# Patient Record
Sex: Female | Born: 1955 | Race: Black or African American | Hispanic: No | Marital: Married | Smoking: Former smoker
Health system: Southern US, Community
[De-identification: ages and names within clinical notes are randomized; demographics above are authoritative.]

## PROBLEM LIST (undated history)

## (undated) DIAGNOSIS — Z9221 Personal history of antineoplastic chemotherapy: Secondary | ICD-10-CM

## (undated) DIAGNOSIS — E785 Hyperlipidemia, unspecified: Secondary | ICD-10-CM

## (undated) DIAGNOSIS — I10 Essential (primary) hypertension: Secondary | ICD-10-CM

## (undated) DIAGNOSIS — J441 Chronic obstructive pulmonary disease with (acute) exacerbation: Secondary | ICD-10-CM

## (undated) DIAGNOSIS — E119 Type 2 diabetes mellitus without complications: Secondary | ICD-10-CM

## (undated) DIAGNOSIS — J45909 Unspecified asthma, uncomplicated: Secondary | ICD-10-CM

## (undated) DIAGNOSIS — C801 Malignant (primary) neoplasm, unspecified: Secondary | ICD-10-CM

---
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
## 2006-07-23 ENCOUNTER — Other Ambulatory Visit: Payer: Self-pay

## 2006-07-23 ENCOUNTER — Inpatient Hospital Stay: Payer: Self-pay | Admitting: Internal Medicine

## 2006-07-24 ENCOUNTER — Other Ambulatory Visit: Payer: Self-pay

## 2007-02-01 ENCOUNTER — Emergency Department: Payer: Self-pay | Admitting: Emergency Medicine

## 2007-02-01 ENCOUNTER — Other Ambulatory Visit: Payer: Self-pay

## 2008-06-16 ENCOUNTER — Inpatient Hospital Stay: Payer: Self-pay | Admitting: Internal Medicine

## 2009-11-16 ENCOUNTER — Emergency Department: Payer: Self-pay | Admitting: Emergency Medicine

## 2010-05-02 ENCOUNTER — Emergency Department: Payer: Self-pay | Admitting: Emergency Medicine

## 2010-05-04 ENCOUNTER — Inpatient Hospital Stay: Payer: Self-pay | Admitting: Internal Medicine

## 2010-06-13 ENCOUNTER — Ambulatory Visit: Payer: Self-pay | Admitting: Family Medicine

## 2010-07-03 ENCOUNTER — Ambulatory Visit: Payer: Self-pay | Admitting: Family Medicine

## 2010-08-01 ENCOUNTER — Ambulatory Visit: Payer: Self-pay | Admitting: Family Medicine

## 2010-08-06 ENCOUNTER — Observation Stay: Payer: Self-pay | Admitting: Specialist

## 2010-09-01 ENCOUNTER — Ambulatory Visit: Payer: Self-pay | Admitting: Family Medicine

## 2010-10-01 ENCOUNTER — Ambulatory Visit: Payer: Self-pay | Admitting: Family Medicine

## 2011-07-27 ENCOUNTER — Observation Stay: Payer: Self-pay | Admitting: Internal Medicine

## 2011-07-27 DIAGNOSIS — I517 Cardiomegaly: Secondary | ICD-10-CM

## 2011-07-27 DIAGNOSIS — R079 Chest pain, unspecified: Secondary | ICD-10-CM

## 2011-07-27 LAB — COMPREHENSIVE METABOLIC PANEL
Albumin: 3.6 g/dL (ref 3.4–5.0)
Anion Gap: 18 — ABNORMAL HIGH (ref 7–16)
BUN: 15 mg/dL (ref 7–18)
Calcium, Total: 9.4 mg/dL (ref 8.5–10.1)
Chloride: 101 mmol/L (ref 98–107)
EGFR (African American): >60
EGFR (Non-African Amer.): >60
Glucose: 342 mg/dL — ABNORMAL HIGH (ref 65–99)
Osmolality: 296 (ref 275–301)
Potassium: 3.2 mmol/L — ABNORMAL LOW (ref 3.5–5.1)
SGPT (ALT): 19 U/L
Sodium: 141 mmol/L (ref 136–145)
Total Protein: 7.9 g/dL (ref 6.4–8.2)

## 2011-07-27 LAB — CBC
HCT: 40.2 % (ref 35.0–47.0)
MCH: 28.1 pg (ref 26.0–34.0)
MCHC: 32.9 g/dL (ref 32.0–36.0)
MCV: 86 fL (ref 80–100)
Platelet: 266 10*3/uL (ref 150–440)
RDW: 15 % — ABNORMAL HIGH (ref 11.5–14.5)
WBC: 8.7 10*3/uL (ref 3.6–11.0)

## 2011-07-27 LAB — URINALYSIS, COMPLETE
Bacteria: NONE SEEN
Bilirubin,UR: NEGATIVE
Glucose,UR: >=500 mg/dL (ref 0–75)
Leukocyte Esterase: NEGATIVE
Protein: NEGATIVE
WBC UR: <1 /HPF (ref 0–5)

## 2011-07-27 LAB — CK TOTAL AND CKMB (NOT AT ARMC)
CK, Total: 127 U/L (ref 21–215)
CK, Total: 115 U/L (ref 21–215)
CK-MB: 2 ng/mL (ref 0.5–3.6)
CK-MB: 2.8 ng/mL (ref 0.5–3.6)

## 2011-07-27 LAB — TROPONIN I
Troponin-I: <0.02 ng/mL
Troponin-I: 0.18 ng/mL — ABNORMAL HIGH

## 2011-07-27 LAB — HEMOGLOBIN A1C: Hemoglobin A1C: 11.2 % — ABNORMAL HIGH (ref 4.2–6.3)

## 2011-08-05 ENCOUNTER — Encounter: Payer: Self-pay | Admitting: Nurse Practitioner

## 2013-05-25 ENCOUNTER — Inpatient Hospital Stay: Payer: Self-pay | Admitting: Internal Medicine

## 2013-05-25 LAB — CBC WITH DIFFERENTIAL/PLATELET
Basophil #: 0.1 10*3/uL (ref 0.0–0.1)
Basophil %: 0.9 %
Eosinophil #: 0.1 10*3/uL (ref 0.0–0.7)
Lymphocyte %: 19.7 %
MCH: 27.4 pg (ref 26.0–34.0)
MCHC: 32.8 g/dL (ref 32.0–36.0)
MCV: 84 fL (ref 80–100)
Monocyte #: 1 x10 3/mm — ABNORMAL HIGH (ref 0.2–0.9)
Monocyte %: 9.6 %
Neutrophil #: 7.4 10*3/uL — ABNORMAL HIGH (ref 1.4–6.5)
WBC: 10.8 10*3/uL (ref 3.6–11.0)

## 2013-05-25 LAB — COMPREHENSIVE METABOLIC PANEL
Albumin: 3.7 g/dL (ref 3.4–5.0)
Alkaline Phosphatase: 53 U/L
BUN: 10 mg/dL (ref 7–18)
Calcium, Total: 9.5 mg/dL (ref 8.5–10.1)
Chloride: 104 mmol/L (ref 98–107)
Co2: 27 mmol/L (ref 21–32)
Creatinine: 0.76 mg/dL (ref 0.60–1.30)
EGFR (Non-African Amer.): >60
Glucose: 252 mg/dL — ABNORMAL HIGH (ref 65–99)
Osmolality: 281 (ref 275–301)
SGOT(AST): 22 U/L (ref 15–37)
SGPT (ALT): 22 U/L (ref 12–78)

## 2013-05-25 LAB — LIPASE, BLOOD: Lipase: 115 U/L (ref 73–393)

## 2013-05-25 LAB — URINALYSIS, COMPLETE
Ketone: NEGATIVE
Leukocyte Esterase: NEGATIVE
Nitrite: NEGATIVE
Ph: 5 (ref 4.5–8.0)
RBC,UR: 1 /HPF (ref 0–5)
Squamous Epithelial: 6

## 2013-05-26 LAB — CBC WITH DIFFERENTIAL/PLATELET
Basophil #: 0.1 10*3/uL (ref 0.0–0.1)
Basophil %: 0.9 %
HCT: 40.7 % (ref 35.0–47.0)
Lymphocyte %: 30.3 %
MCH: 28.5 pg (ref 26.0–34.0)
MCV: 85 fL (ref 80–100)
Monocyte #: 0.6 x10 3/mm (ref 0.2–0.9)
Monocyte %: 8.2 %
Platelet: 234 10*3/uL (ref 150–440)

## 2013-05-26 LAB — BASIC METABOLIC PANEL
Anion Gap: 7 (ref 7–16)
Calcium, Total: 9 mg/dL (ref 8.5–10.1)
Co2: 27 mmol/L (ref 21–32)
Creatinine: 0.64 mg/dL (ref 0.60–1.30)
EGFR (African American): >60
EGFR (Non-African Amer.): >60
Potassium: 2.9 mmol/L — ABNORMAL LOW (ref 3.5–5.1)

## 2013-05-26 LAB — OCCULT BLOOD X 1 CARD TO LAB, STOOL: Occult Blood, Feces: POSITIVE

## 2013-05-26 LAB — MAGNESIUM: Magnesium: 1.4 mg/dL — ABNORMAL LOW

## 2013-05-26 LAB — CLOSTRIDIUM DIFFICILE(ARMC)

## 2013-05-27 LAB — LIPID PANEL
Cholesterol: 211 mg/dL — ABNORMAL HIGH (ref 0–200)
HDL Cholesterol: 38 mg/dL — ABNORMAL LOW (ref 40–60)
Triglycerides: 232 mg/dL — ABNORMAL HIGH (ref 0–200)
VLDL Cholesterol, Calc: 46 mg/dL — ABNORMAL HIGH (ref 5–40)

## 2013-05-27 LAB — BASIC METABOLIC PANEL
Anion Gap: 7 (ref 7–16)
BUN: 3 mg/dL — ABNORMAL LOW (ref 7–18)
Calcium, Total: 8.7 mg/dL (ref 8.5–10.1)
Co2: 27 mmol/L (ref 21–32)
Creatinine: 0.69 mg/dL (ref 0.60–1.30)
EGFR (African American): >60
EGFR (Non-African Amer.): >60
Glucose: 159 mg/dL — ABNORMAL HIGH (ref 65–99)
Osmolality: 285 (ref 275–301)
Sodium: 143 mmol/L (ref 136–145)

## 2013-05-27 LAB — CBC WITH DIFFERENTIAL/PLATELET
Basophil %: 1 %
Eosinophil #: 0.3 10*3/uL (ref 0.0–0.7)
Eosinophil %: 3.8 %
HCT: 39.1 % (ref 35.0–47.0)
HGB: 13.2 g/dL (ref 12.0–16.0)
Lymphocyte #: 2.3 10*3/uL (ref 1.0–3.6)
Lymphocyte %: 32.8 %
MCH: 28.5 pg (ref 26.0–34.0)
MCHC: 33.8 g/dL (ref 32.0–36.0)
Monocyte #: 0.6 x10 3/mm (ref 0.2–0.9)
Neutrophil #: 3.8 10*3/uL (ref 1.4–6.5)
Neutrophil %: 53.9 %
RBC: 4.65 10*6/uL (ref 3.80–5.20)
RDW: 14.1 % (ref 11.5–14.5)

## 2013-05-28 LAB — BASIC METABOLIC PANEL
Anion Gap: 7 (ref 7–16)
BUN: 9 mg/dL (ref 7–18)
Calcium, Total: 9.4 mg/dL (ref 8.5–10.1)
Co2: 27 mmol/L (ref 21–32)
EGFR (African American): >60
Glucose: 120 mg/dL — ABNORMAL HIGH (ref 65–99)
Osmolality: 274 (ref 275–301)
Potassium: 3.4 mmol/L — ABNORMAL LOW (ref 3.5–5.1)
Sodium: 137 mmol/L (ref 136–145)

## 2013-05-28 LAB — CBC WITH DIFFERENTIAL/PLATELET
Basophil #: 0.1 10*3/uL (ref 0.0–0.1)
Basophil %: 0.9 %
Eosinophil %: 3.6 %
HGB: 13.2 g/dL (ref 12.0–16.0)
Lymphocyte #: 2.5 10*3/uL (ref 1.0–3.6)
MCH: 28.2 pg (ref 26.0–34.0)
MCV: 85 fL (ref 80–100)
Monocyte #: 0.6 x10 3/mm (ref 0.2–0.9)
Neutrophil #: 4.1 10*3/uL (ref 1.4–6.5)
RDW: 14.1 % (ref 11.5–14.5)
WBC: 7.6 10*3/uL (ref 3.6–11.0)

## 2014-05-13 ENCOUNTER — Emergency Department: Payer: Self-pay | Admitting: Emergency Medicine

## 2014-05-13 DIAGNOSIS — M19011 Primary osteoarthritis, right shoulder: Secondary | ICD-10-CM | POA: Diagnosis not present

## 2014-05-13 DIAGNOSIS — I10 Essential (primary) hypertension: Secondary | ICD-10-CM | POA: Diagnosis not present

## 2014-05-13 DIAGNOSIS — Z720 Tobacco use: Secondary | ICD-10-CM | POA: Diagnosis not present

## 2014-05-13 DIAGNOSIS — M7591 Shoulder lesion, unspecified, right shoulder: Secondary | ICD-10-CM | POA: Diagnosis not present

## 2014-05-13 DIAGNOSIS — M779 Enthesopathy, unspecified: Secondary | ICD-10-CM | POA: Diagnosis not present

## 2014-05-13 LAB — BASIC METABOLIC PANEL
Anion Gap: 12 (ref 7–16)
BUN: 12 mg/dL (ref 7–18)
CALCIUM: 8.7 mg/dL (ref 8.5–10.1)
CREATININE: 0.71 mg/dL (ref 0.60–1.30)
Chloride: 103 mmol/L (ref 98–107)
Co2: 24 mmol/L (ref 21–32)
EGFR (Non-African Amer.): >60
Glucose: 251 mg/dL — ABNORMAL HIGH (ref 65–99)
Osmolality: 286 (ref 275–301)
Potassium: 3.5 mmol/L (ref 3.5–5.1)
Sodium: 139 mmol/L (ref 136–145)

## 2014-05-13 LAB — CBC
HCT: 44.8 % (ref 35.0–47.0)
HGB: 14.7 g/dL (ref 12.0–16.0)
MCH: 28.3 pg (ref 26.0–34.0)
MCHC: 32.8 g/dL (ref 32.0–36.0)
MCV: 86 fL (ref 80–100)
Platelet: 243 10*3/uL (ref 150–440)
RBC: 5.2 10*6/uL (ref 3.80–5.20)
RDW: 14.7 % — AB (ref 11.5–14.5)
WBC: 9.2 10*3/uL (ref 3.6–11.0)

## 2014-05-13 LAB — TROPONIN I

## 2014-06-07 DIAGNOSIS — R079 Chest pain, unspecified: Secondary | ICD-10-CM | POA: Diagnosis not present

## 2014-06-07 DIAGNOSIS — I10 Essential (primary) hypertension: Secondary | ICD-10-CM | POA: Diagnosis not present

## 2014-06-07 DIAGNOSIS — R7301 Impaired fasting glucose: Secondary | ICD-10-CM | POA: Diagnosis not present

## 2014-06-07 DIAGNOSIS — N959 Unspecified menopausal and perimenopausal disorder: Secondary | ICD-10-CM | POA: Diagnosis not present

## 2014-06-07 DIAGNOSIS — F17210 Nicotine dependence, cigarettes, uncomplicated: Secondary | ICD-10-CM | POA: Diagnosis not present

## 2014-06-09 DIAGNOSIS — Z0001 Encounter for general adult medical examination with abnormal findings: Secondary | ICD-10-CM | POA: Diagnosis not present

## 2014-06-09 DIAGNOSIS — R7301 Impaired fasting glucose: Secondary | ICD-10-CM | POA: Diagnosis not present

## 2014-06-09 DIAGNOSIS — E559 Vitamin D deficiency, unspecified: Secondary | ICD-10-CM | POA: Diagnosis not present

## 2014-06-09 DIAGNOSIS — I10 Essential (primary) hypertension: Secondary | ICD-10-CM | POA: Diagnosis not present

## 2014-06-09 DIAGNOSIS — N959 Unspecified menopausal and perimenopausal disorder: Secondary | ICD-10-CM | POA: Diagnosis not present

## 2014-06-22 DIAGNOSIS — R079 Chest pain, unspecified: Secondary | ICD-10-CM | POA: Diagnosis not present

## 2014-06-22 DIAGNOSIS — I10 Essential (primary) hypertension: Secondary | ICD-10-CM | POA: Diagnosis not present

## 2014-07-03 DIAGNOSIS — F17210 Nicotine dependence, cigarettes, uncomplicated: Secondary | ICD-10-CM | POA: Diagnosis not present

## 2014-07-03 DIAGNOSIS — R079 Chest pain, unspecified: Secondary | ICD-10-CM | POA: Diagnosis not present

## 2014-07-03 DIAGNOSIS — E782 Mixed hyperlipidemia: Secondary | ICD-10-CM | POA: Diagnosis not present

## 2014-07-03 DIAGNOSIS — R87610 Atypical squamous cells of undetermined significance on cytologic smear of cervix (ASC-US): Secondary | ICD-10-CM | POA: Diagnosis not present

## 2014-07-03 DIAGNOSIS — R300 Dysuria: Secondary | ICD-10-CM | POA: Diagnosis not present

## 2014-07-03 DIAGNOSIS — Z124 Encounter for screening for malignant neoplasm of cervix: Secondary | ICD-10-CM | POA: Diagnosis not present

## 2014-07-03 DIAGNOSIS — I10 Essential (primary) hypertension: Secondary | ICD-10-CM | POA: Diagnosis not present

## 2014-07-03 DIAGNOSIS — E1165 Type 2 diabetes mellitus with hyperglycemia: Secondary | ICD-10-CM | POA: Diagnosis not present

## 2014-08-07 DIAGNOSIS — E1165 Type 2 diabetes mellitus with hyperglycemia: Secondary | ICD-10-CM | POA: Diagnosis not present

## 2014-08-07 DIAGNOSIS — S0590XA Unspecified injury of unspecified eye and orbit, initial encounter: Secondary | ICD-10-CM | POA: Diagnosis not present

## 2014-08-07 DIAGNOSIS — E782 Mixed hyperlipidemia: Secondary | ICD-10-CM | POA: Diagnosis not present

## 2014-08-07 DIAGNOSIS — I10 Essential (primary) hypertension: Secondary | ICD-10-CM | POA: Diagnosis not present

## 2014-08-07 DIAGNOSIS — F17210 Nicotine dependence, cigarettes, uncomplicated: Secondary | ICD-10-CM | POA: Diagnosis not present

## 2014-09-22 NOTE — Consult Note (Signed)
Brief Consult Note: Diagnosis: c diff, rectal bleeding.   Patient was seen by consultant.   Consult note dictated.   Comments: rectal bleeding and l sided colitis on CT.  C diff is positive which is likely causing both.   Her bleeding has also resovled. ( small amt this am).   No indication for colonoscopy currently.   Will plan for colonoscopy in about 6 weeks to r/o other causes of rectal bleeding and colonic thickening although likely all from c diff.  Electronic Signatures: Arther Dames (MD)  (Signed 25-Dec-14 15:47)  Authored: Brief Consult Note   Last Updated: 25-Dec-14 15:47 by Arther Dames (MD)

## 2014-09-22 NOTE — H&P (Signed)
PATIENT NAME:  Dawn Alvarez, Dawn Alvarez MR#:  [id number] DATE OF BIRTH:  1956/02/21  DATE OF ADMISSION:  05/25/2013  PRIMARY CARE PHYSICIAN:  None. The patient used to go to the [facility], but has not been there in many months.     CHIEF COMPLAINT:  Abdominal pain and bright red blood per rectum.   HISTORY OF PRESENTING ILLNESS:  A 59 year old [place] American female patient with a history of diabetes, hypertension, COPD, not on any medications, presents to the hospital complaining of acute onset of diffuse abdominal pain earlier today morning. The patient has had 3 episodes of bright red blood per rectum with some diarrhea, with the last episode being in the Emergency Room. Her first episode was pure blood without any stool. She has had nausea, but no vomiting. Never had any symptoms like this. Afebrile, but tachycardic at 116 on arrival to Emergency Room. Her white count is at 10.8.   She is not on any aspirin, Plavix, or anticoagulation. Her blood pressure was elevated at 240/144 on initial presentation to the Emergency Room.   PAST MEDICAL HISTORY:  1.  GERD.  2.  Hypertension.  3.  Diabetes mellitus type 2.  4.  Obesity.  5.  COPD.  6.  Tobacco abuse.   FAMILY HISTORY:  Hypertension, coronary artery disease.   SOCIAL HISTORY:  The patient smokes occasionally every other day 1 to 2 cigarettes. Rare alcohol use. No illicit drug use. 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   CODE STATUS:  FULL CODE.   ALLERGIES:  No known drug allergies.   HOME MEDICATIONS:  None, but reviewing old records, the patient has been on metformin, Glucotrol, lisinopril, hydrochlorothiazide, hydralazine, aspirin, pravastatin.   REVIEW OF SYSTEMS:  CONSTITUTIONAL:  Complains of some fatigue. No weight loss, weight gain.  EYES:  No blurred vision, pain, redness.  ENT:  No tinnitus, ear pain, hearing loss.  RESPIRATORY:  No cough, wheezing, or hemoptysis.  CARDIOVASCULAR:  No  chest pain, orthopnea, edema.  GASTROINTESTINAL:  Has nausea, but no vomiting. Has abdominal pain and GI bleed.  GENITOURINARY:  No dysuria, hematuria, or frequency.  ENDOCRINE:  No polyuria, nocturia, or thyroid problems.  HEMATOLOGIC AND LYMPHATIC:  No anemia, easy bruising or bleeding.  INTEGUMENTARY:  No acne, rash, lesions.  MUSCULOSKELETAL:  No back pain, arthritis.  NEUROLOGICAL:  No focal numbness, weakness, seizures.  PSYCHIATRIC:  No anxiety or depression.   PHYSICAL EXAMINATION:  VITAL SIGNS:  Temperature 98.5, pulse of 116, blood pressure 240/144, saturating 94% on room air.  GENERAL:  Obese African American female patient lying in bed in mild distress secondary to her abdominal pain.  PSYCHIATRIC:  Alert and oriented x3. Mood and affect appropriate. Judgment intact.  HEENT:  Atraumatic, normocephalic. Oral mucosa moist and pink. External ears and nose normal. No pallor or icterus. Pupils are equally reactive to light.  NECK:  Supple. No thyromegaly. No palpable lymph nodes. Trachea midline. No carotid bruit, JVD.  CARDIOVASCULAR:  S1, S2, without any murmurs. Tachycardic, regular. Peripheral pulses 2+. No edema.  RESPIRATORY:  Normal work of breathing. Clear to auscultation on both sides.  GASTROINTESTINAL:  Soft abdomen. Tenderness diffusely. Noted to be guarding. Bowel sounds decreased. No hepatosplenomegaly palpable. Has a scar from prior C-section.  GENITOURINARY:  No CVA tenderness or bladder distention.  SKIN:  Warm and dry. No petechiae, rash, ulcers.  MUSCULOSKELETAL:  No joint swelling, redness, effusion of the large joints. Normal muscle tone.  NEUROLOGICAL:  Motor strength 5/5 in upper and lower extremities. Sensation remains intact all over.  LYMPHATIC:  No cervical lymphadenopathy.   LABORATORY STUDIES:  Show a glucose of 252, BUN 10, creatinine 0.76, sodium 137, potassium 3.3, chloride 104. AST, ALT, alkaline phosphatase, bilirubin normal. Troponin less than 0.02.  WBC 10.8, hemoglobin 14.7, platelets of 275, neutrophils 68%.   CT scan of the abdomen and pelvis with contrast shows diffuse colitis. No evidence of perforation or abscess. Does have enlarged fibroid uterus.   ASSESSMENT AND PLAN:  1.  Acute diffuse colitis with some diarrhea. We will check Clostridium difficile, start her on Cipro/Flagyl for infectious colitis. Can be inflammatory bowel disease. We will consult gastroenterology, as she will need a colonoscopy once this resolves. Get blood cultures. The patient will be n.p.o. except medications at this time. Hopefully, can be started on some diet when she is feeling better. Her gastrointestinal bleed is secondary to the colitis. Hemoglobin looks stable, but she also is dehydrated, and I suspect the hemoglobin counts will drop once she is hydrated. I have taken consent for blood transfusion if needed. The patient agrees with this.  2.  Accelerated hypertension. The patient's systolic was 573. We will start her on hydrochlorothiazide. The patient has been on lisinopril and hydrochlorothiazide and hydralazine in the past. She did get contrast. We will not use the lisinopril, but we will introduce hydralazine. Use IV p.r.n. medications at this time. No headache or chest pain.  3.  Diabetes mellitus type 2. The patient was on Glucotrol/metformin in the past. No metformin secondary to the contrast she received. The patient is n.p.o. and will be on sliding scale insulin. Will need to be restarted on the Glucotrol from tomorrow. We will check an HbA1c.  4.  Chronic obstructive pulmonary disease. Nebulizers p.r.n.  5.  Deep vein thrombosis prophylaxis with sequential compression devices. No heparin secondary to gastrointestinal bleed.  6.  Noncompliance. I have counseled the patient to be compliant with the medications and follow up with primary care physician after discharge.  7.  Tobacco abuse. Counseled the patient greater than 3 minutes to quit smoking. She  mentioned that she does not need a nicotine patch.   Time spent today on this case was 45 minutes.     ____________________________ Leia Alf Tj Kitchings, MD srs:ms D: 05/25/2013 16:43:07 ET T: 05/25/2013 18:05:46 ET JOB#: [id number]  cc: Alveta Heimlich R. Traven Davids, MD, <Dictator> [facility] Neita Carp MD ELECTRONICALLY SIGNED 05/26/2013 15:41

## 2014-09-22 NOTE — Consult Note (Signed)
Details:   - GI follow up  Diarrhea resolved. No abd pain. Tolerating PO  Exam:  Chest: CTA, no w/c CV: reg, no m/r/g Abd: nt,nd,nabs  Recs:   - flagyl 500 mg q8 hr for 14 days - anticipate d/c tomorrow.  - colonoscopy in 6 - 8 weeks to r/o other causes of GI bleeding.   - gi will sign off, please call with questions for concerns.   Electronic Signatures: Arther Dames (MD)  (Signed 27-Dec-14 17:19)  Authored: Details   Last Updated: 27-Dec-14 17:19 by Arther Dames (MD)

## 2014-09-22 NOTE — Consult Note (Signed)
Details:   - GI follow up:  Diarrhea nearly resolved.  Two stools today. No further bleeding. No n/v, f/c  Exam: Chest: CTA CV: reg, no m/r/g Abd: obese, mild diffuse tenderness, soft  Plan:  - cont 14 day course of flagyl for c.diff - colonoscopy in 6 - 8 weeks to r/o other cause of bleeding. - no futher recs currently.   Electronic Signatures: Arther Dames (MD)  (Signed 26-Dec-14 17:27)  Authored: Details   Last Updated: 26-Dec-14 17:27 by Arther Dames (MD)

## 2014-09-23 NOTE — Discharge Summary (Signed)
PATIENT NAME:  Dawn Alvarez, Dawn Alvarez MR#:  [id number] DATE OF BIRTH:  August 30, 1955  DATE OF ADMISSION:  05/25/2013 DATE OF DISCHARGE:  05/29/2013  DISCHARGE DIAGNOSES:  1.  Acute diffuse colitis secondary to Clostridium difficile and Campylobacter.  2.  Diabetes mellitus type 2.  3.  Hyperlipidemia.  4.  Accelerated hypertension.  5.  Hypokalemia, hypomagnesemia.  6.  Chronic obstructive pulmonary disease.   DISCHARGE MEDICATIONS: 1.  Flagyl 500 mg every 8 hours for 11 days.  2.  Lisinopril 20 mg p.o. daily.  3.  Glipizide 5 mg p.o. b.i.d. before meals.  4.  Cipro 250 mg q.12 for 11 days.  5.  Hydralazine 25 mg p.o. 4 times daily.  6.  Pravastatin 40 mg p.o. daily.  7.  HCTZ 25 mg p.o. daily.   DIET: Low-sodium, low-fat, ADA diet.   CONSULTATIONS:  GI consult with Dr. Vira Agar.    PRIMARY DOCTOR:  The patient does not have primary doctor, advised her to follow up with [facility]. The patient also has appointment with Dr. Rayann Heman. The patient needs colonoscopy in 6 weeks.   HOSPITAL COURSE: The patient is a 59 year old African American female admitted because of abdominal pain and diarrhea. Look at the history and physical for full details. The patient noted to have bright red blood from rectum, also some nausea.  1.  Regarding her diarrhea, she had a CT abdomen and pelvis which showed diffuse colitis. The patient was started on IV Cipro and Flagyl. The patient was kept n.p.o.  IV fluids were given. The patient's stool cultures showed C. diff and also Campylobacter. The patient was started on Cipro for Campylobacter and Flagyl for C. diff.  Diarrhea improved.  The patient did not have any further blood in stool. The patient was seen by GI, Dr. Rayann Heman and Dr. Vira Agar, recommended outpatient colonoscopy in 6 weeks. The patient's hemoglobin on admission 14.7 and the following day hemoglobin was 13.7 so hemoglobin did not drop. The patient has no Salmonella or Shigella.   2.  Diabetes mellitus type 2. The  patient does have diabetes but not taking medications at home. We started her on Glucotrol and the patient did well. The patient also given teaching about diabetes. LDL is 127, she is started on statins.  3.  The patient's blood pressure also was high with blood pressure of 160/76 average so she started on hydralazine, lisinopril. The patient's blood pressure this morning is 136/82, pulse 68. Her diarrhea, improved very nicely, no more abdominal pain, tolerating the diet.  4.  Her hypokalemia is due to diarrhea which is being replaced, also had hypomagnesemia. Magnesium was 1.4 which was replaced.   The patient has no more symptoms today. The patient feels much better and wants to go home. The patient can follow up with Dr. Rayann Heman in 6 weeks and needs an appointment with primary care physician.  We are going to get the caseworker to help with that before she goes home.   TIME SPENT ON DISCHARGE PREPARATION: More than 30 minutes.   ____________________________ Epifanio Lesches, MD sk:cs D: 05/29/2013 10:52:31 ET T: 05/29/2013 20:26:47 ET JOB#: [id number]  cc: Epifanio Lesches, MD, <Dictator> Epifanio Lesches MD ELECTRONICALLY SIGNED 06/05/2013 17:10

## 2014-09-23 NOTE — Consult Note (Signed)
PATIENT NAME:  Dawn Alvarez, Dawn Alvarez MR#:  [id number] DATE OF BIRTH:  30-Jun-1955  DATE OF CONSULTATION:  05/26/2013  REFERRING PHYSICIAN:  Dr. Abel Presto. CONSULTING PHYSICIAN:  Arther Dames, MD  REASON FOR THE CONSULT: Rectal bleeding, colitis on CT scan.   HISTORY OF PRESENT ILLNESS: Dawn Alvarez is a 59 year old female with a past medical history notable for DM2, COPD, hypertension, who presented to the Emergency Room for evaluation of abdominal pain and rectal bleeding. Dawn Alvarez reports that 1 day prior to presenting to the Emergency Room, she developed some left lower quadrant abdominal pain. Shortly after that, she developed multiple episodes of bright red blood per rectum. She thinks it was mostly blood, although there was a small amount of brown stool mixed in there, especially as the bleeding progressed, the amount of stool decreased. She continued to have some rectal bleeding in the Emergency Room and also on the floor last night. This morning, she had 1 very small bowel movement with just a small amount of old blood in it. She denies having any bowel movements since that episode this morning.   In the Emergency Room, she had a CT scan which showed basically thickening throughout her colon.   Since presentation, she has been started on antibiotics and is feeling improved. In addition, her stool tests were positive for C. diff. Her hemoglobin has been stable and normal while in the hospital.    PAST MEDICAL HISTORY: 1.  GERD.  2.  Hypertension.  3.  DM2.  4.  Obesity.  5.  COPD.  6.  Tobacco use.   FAMILY HISTORY: She denies any family history of GI malignancy.   SOCIAL HISTORY: She has some ongoing smoking. She denies any alcohol to me.   ALLERGIES: NKDA.   HOME MEDICATIONS: She is currently not taking any medications.   REVIEW OF SYSTEMS:   REVIEW OF SYSTEMS:   CONSTITUTIONAL: No weight gain or weight loss.  No fever or chills. HEENT: No oral lesions or sore throat. No  vision changes. GASTROINTESTINAL: See HPI.  HEME/LYMPH: No easy bruising or bleeding. CARDIOVASCULAR: No chest pain or dyspnea on exertion. GENITOURINARY: No hematuria. INTEGUMENTARY: No rashes or pruritus PSYCHIATRIC: No depression/anxiety.  ENDOCRINE: No heat/cold intolerance, no hair loss or skin changes. ALLERGIC/IMMUNOLOGIC: Negative for hives. RESPIRATORY: No cough, no shortness of breath.  MUSCULOSKELETAL: No joint swelling or muscle pain.  PHYSICAL EXAMINATION: VITAL SIGNS: Temperature is 99.6. Pulse is 94. Blood pressure is 146/87. Pulse ox is 97% on room air.  GENERAL: Alert and oriented x 4.  No acute distress. Appears stated age. HEENT: Normocephalic/atraumatic. Extraocular movements are intact. Anicteric. NECK: Soft, supple. JVP appears normal. No adenopathy. CHEST: Clear to auscultation. No wheeze or crackle. Respirations unlabored. HEART: Regular. No murmur, rub, or gallop.  Normal S1 and S2. ABDOMEN: Positive for abdominal adiposity.  Also positive for left lower quadrant abdominal pain. EXTREMITIES: No swelling, well perfused. SKIN: No rash or lesion. Skin color, texture, turgor normal. NEUROLOGICAL: Grossly intact. PSYCHIATRIC: Normal tone and affect. MUSCULOSKELETAL: No joint swelling or erythema.    LABORATORY DATA: Currently, her sodium is 138. Potassium 2.9, creatinine 0.64. BUN is 5.  Lipase is normal. Liver enzymes: AST 22, ALT 22, alk phos 53, T-bili 0.3.  Current CBC: Her white count is 7.6, hemoglobin 13.7, hematocrit 41. Platelets are 234. The stool studies are positive for C. diff.   RADIOLOGY:  CT scan shows diffuse colitis.   ASSESSMENT AND PLAN:  1.  Rectal bleeding, Clostridium  diff colitis:  I do suspect that all of the symptoms are likely related to the C. diff. This is likely also responsible for the rectal bleeding. Her hemoglobin has been very stable, and she is starting to feel improved on antibiotics.   PLAN: Would recommend completing a  two-week course of Flagyl for her C. diff.   In addition, since she has never had a colonoscopy, it would be warranted in about 6 to 8 weeks to perform a colonoscopy, just to make sure there is not another cause for rectal bleeding, especially to rule out malignancy of the colon. I think this is very unlikely, and it is mostly just likely from inflammation from the C. diff. This would be the only other intervention that I would take at this time. I suspect she is getting close to being able to be discharged.  Thank you for this consult.   ____________________________ Arther Dames, MD mr:dmm D: 05/26/2013 20:52:21 ET T: 05/26/2013 20:59:27 ET JOB#: [id number]  cc: Arther Dames, MD, <Dictator> Mellody Life MD ELECTRONICALLY SIGNED 06/05/2013 16:59

## 2014-09-24 NOTE — H&P (Signed)
PATIENT NAME:  Dawn Alvarez, Dawn Alvarez MR#:  [id number] DATE OF BIRTH:  04-04-1956  DATE OF ADMISSION:  07/27/2011  PRIMARY CARE PHYSICIAN: [facility]  CHIEF COMPLAINT: Chest pain.   HISTORY OF PRESENT ILLNESS: Dawn Alvarez is a 59 year old African American female with history of systemic hypertension and diabetes mellitus. The patient reports that she developed a cough yesterday associated with nasal congestion. Last night she was sleeping then she woke up at 9:00 p.m. feeling headache and sharp chest pain in the mid chest area. The pain is described as sharp pain and severity was 8 on a scale of 10. When pain persisted, she decided to call EMS and was transported to the emergency department. On the way to the hospital, her initial blood pressure was reported to be 017 systolic. She was given only aspirin. By the time she came to the hospital, her systolic blood pressure was down to 120 and later it dropped to less than 100. The patient has this phenomenon of hypotension lability and it was noticed in her last admission, in December of last year, when she came with chest pain and hypotension precipitated by sublingual nitroglycerin. At that time, she had stress test and her work-up was negative. Her chest pain was musculoskeletal. The emergency department physician was concerned about her chest pain since it was associated with temporary hypotension. The patient was admitted for observation and to follow-up on cardiac enzymes.   REVIEW OF SYSTEMS: CONSTITUTIONAL: She reports no fever, but she had some chills yesterday along with her cough. No night sweats. No fatigue. EYES: Denies any blurring of vision. No double vision. ENT: No hearing impairment. No sore throat. No dysphagia. She reports nasal congestion and headache. CARDIOVASCULAR: Reports the chest pain as above, mild shortness of breath. No edema. No syncope. RESPIRATORY: Dry cough for the last couple of days. Chest pain is reported as above.  GASTROINTESTINAL: No abdominal pain. No nausea and no vomiting. GENITOURINARY: No dysuria or frequency of urination. MUSCULOSKELETAL: No joint pain or swelling. No muscular pain or swelling. INTEGUMENTARY: No skin rash. No ulcers. NEUROLOGIC: No focal weakness. No seizure activity, but reports headache. Headache is easing off since she came. PSYCHIATRY: No anxiety or depression. ENDOCRINE: No night sweats. No heat or cold intolerance.     PAST MEDICAL HISTORY:  1. History of systemic hypertension. 2. History of chronic obstructive pulmonary disease. 3. Diabetes mellitus type 2.  4. Hypercholesterolemia.   FAMILY HISTORY: Her mother suffered from hypertension and diabetes.   SOCIAL HABITS: Ex-chronic smoker. She quit a year ago. She used to smoke 2 packs a day since age of 1. She drinks beer every now and then. The last drink was yesterday. She drank two beers.   SOCIAL HISTORY: She is married and living with her husband. 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   ADMISSION MEDICATIONS:  1. Pravastatin 20 mg a day.  2. Lisinopril 20 mg a day. 3. Metoprolol 25 mg twice a day.  4. Hydrochlorothiazide 12.5 mg once a day. 5. Hydralazine 10 mg four times daily.  6. Glucotrol XL 5 mg a day. 7. Glucophage 500 mg twice a day. 8. Catapres 0.2 mg twice a day.   ALLERGIES: No known drug allergies.   PHYSICAL EXAMINATION:   VITAL SIGNS: Blood pressure 133/72; her blood pressure earlier was 200 and then down to 120, then down to below 100. Respiratory rate is 20, pulse 100, and her oxygen saturation is 97%.   GENERAL  APPEARANCE: Middle-aged female lying in bed in no acute distress.   HEAD AND NECK: No pallor. No icterus. No cyanosis.  EARS, NOSE, AND THROAT:  Hearing was normal. Nasal mucosa is congested. Lips and tongue were normal.   EYES: Normal iris and conjunctivae. Pupils are about 4 to 5 mm, sluggishly reactive to light.   NECK: Supple. Trachea at midline. No masses. No  lymphadenopathy.   HEART: Normal S1 and S2. No S3 or S4. No murmur. No gallop. No carotid bruits.   LUNGS: Normal breathing pattern without use of accessory muscles. No rales. There are a few rhonchi and occasional wheezing.   ABDOMEN: Soft without tenderness. No hepatosplenomegaly. No masses. No hernias.   SKIN: No ulcers. No subcutaneous nodules.   MUSCULOSKELETAL: No joint swelling. No clubbing.   NEUROLOGIC: Cranial nerves II through XII were intact. No focal motor deficit.   PSYCHIATRY: The patient is alert and oriented x3. Mood and affect were flat.   LABS/STUDIES: EKG showed normal sinus rhythm at rate of 68 beats per minute. Nonspecific ST elevation in the anterior leads. Otherwise unremarkable EKG. No significant change compared to her previous EKG done on 08/06/2010.   Chest x-ray showed mild cardiomegaly, no consolidation, and no effusion.   Her CBC showed a white count of 8000, hemoglobin 13, hematocrit 40, and platelet count 266. Total CPK was 127. Troponin was less than 0.02. Liver function tests were normal. Serum glucose was 342, BUN 15, creatinine 0.8, sodium 141, and potassium 3.2. Lipase was 196. Her D-dimer was normal at 0.27.   ASSESSMENT:  1. Chest pain, unlikely to be cardiac in origin. It seems to me precipitated and exacerbated by the cough that started yesterday with her upper respiratory tract infection. Nevertheless, we are facing the problem with variation in her blood pressure necessitating admission for observation and followup on her cardiac enzymes.  2. Hypertension. Initial blood pressure was 200 by the time EMS saw the patient and then she became relatively hypotensive when she came to the emergency department. This improved after IV fluids. The patient appears to have labile hypertension. Last year her blood pressure dropped significantly after sublingual nitroglycerin; therefore, I did not order that.  3. Diabetes mellitus. Blood sugar is uncontrolled.   4. Mild hypokalemia.  5. Acute upper respiratory tract infection.  6. History of chronic obstructive pulmonary disease, ex-smoker. 7. Hypercholesterolemia.  8. Obesity.  PLAN: Admit the patient to telemetry for observation. Follow-up on cardiac enzymes. I will continue her home medications with the exception of the hydralazine and Catapres to avoid excessive blood pressure drop. Continue diabetic medications, but hold the Glucophage. I noted slight elevation of anion gap; however, her serum bicarbonate looks normal. The patient needs to quit alcoholism given her risk factors and underlying diabetes as well. I will start her on Keflex 500 mg every 8 hours along with Tussionex for her upper respiratory tract infection symptoms. I do not think that the patient needs a stress test this time or cardiac work-up, other than monitoring her cardiac enzymes and monitoring her blood pressure. For peptic ulcer disease and gastroesophageal reflux symptoms, I placed her on Protonix 40 mg a day. For deep vein thrombosis prophylaxis, I placed her on Lovenox 40 mg subcutaneous once a day. Regarding her hypokalemia, I started her on gentle IV hydration with normal saline along with 20 mEq of KCl in the IV infusion.   TIME NEEDED TO EVALUATE THIS PATIENT: More than 50 minutes.  ____________________________ Clovis Pu. Dailey Buccheri,  MD amd:slb D: 07/27/2011 02:57:43 ET T: 07/27/2011 10:16:42 ET JOB#: [id number]  cc: Clovis Pu. Lenore Manner, MD, <Dictator> [facility] MD ELECTRONICALLY SIGNED 07/27/2011 22:37

## 2014-09-24 NOTE — Discharge Summary (Signed)
PATIENT NAME:  Dawn Alvarez, Dawn Alvarez MR#:  [id number] DATE OF BIRTH:  [birth year]  DATE OF ADMISSION:  07/27/2011 DATE OF DISCHARGE:  07/28/2011  ADMITTING PHYSICIAN: Dr. Lenore Manner  DISCHARGING PHYSICIAN: Dr. Royden Purl PRIMARY CARE PHYSICIAN: [facility]  ADMITTING DIAGNOSIS: Chest pain.   DISCHARGE DIAGNOSES:  1. 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  2. Obesity.  3. Chronic obstructive pulmonary disease.  4. Upper respiratory infection.  5. Diabetes.  6. Uncontrolled hypertension.  7. Uncontrolled headache.  8. Elevated troponin secondary to malignant hypertension.  CONSULTANTS:  1. Case management. 2. Dr. Ida Rogue.   TESTS DONE DURING THIS HOSPITALIZATION:   1. 07/27/2011 chest x-ray showed mild interstitial opacity may be secondary to atelectasis and vascular crowding from lower lung volumes. Interstitial edema or atypical infection are differential considerations.  2. Echo Doppler 07/27/2011 showed ejection fraction greater than 55% with moderate left ventricular concentric hypertrophy. Left ventricular systolic function is normal.  Left atrium is normal size. Right ventricular systolic pressure is normal.  3. Myoview stress testing showed no significant ischemia.   HOSPITAL COURSE: Initial History and Physical were done by Dr. Lenore Manner. Please refer to his note dated 07/27/2011 for complete details.  In brief, this is a 59 year old African American female with history of hypertension and diabetes who developed cough and nasal congestion and came to the hospital with systolic blood pressure greater than 200. She was admitted to the hospitalist service.  1. Chest pain: This was likely costochondritis versus chest pain from demand ischemia. She had elevated troponin which again was most likely demand ischemia. She thus had the above-noted Myoview done on 07/28/2011 by Dr. Ida Rogue. This showed no significant ischemia. In order to control her hypertension  we titrated up her metoprolol.  2. Malignant hypertension: She was sensitive to nitroglycerin. She was hypotensive?. We resumed her on metoprolol lisinopril, and hydrochlorothiazide. Hydralazine was eventually resumed and increased to 20 mg. Metoprolol was increased to 50 mg b.i.d. She had good control with this and was counseled about compliance.  3. Upper respiratory infection with scattered wheezing, history of chronic obstructive pulmonary disease: The patient was on a prednisone taper and Z-Pak.  4. Diabetes, uncontrolled: A1c was found to be 11.2. She was continued on glipizide and added metformin. We counseled her about diet.  5. Obesity: The patient was counseled about diet.  6. Tension headache: The patient was given Tylenol and p.r.n. Motrin and this resolved.  7. Deep vein thrombosis prophylaxis was maintained with Lovenox.   The patient was discharged after stress test on 07/28/2011. Temperature was 98.3, heart rate 81, respirations 18, blood pressure 139/74, sating 95% on room air. LUNGS: Clear to auscultation.  CARDIOVASCULAR: Regular rate and rhythm. ABDOMEN: Benign.   DISCHARGE MEDICATIONS:  1. Lisinopril 20 mg 3 times a day.  2. Hydrochlorothiazide 12.5 mg daily. 3. Glucophage 500 mg 2 times a day.  4. Glucotrol XL 5 mg once a day. 5. Pravachol 40 mg daily.  6. Tramadol 50 mg p.o. every six hours p.r.n. pain.  7. Prednisone taper. 8. Z-Pak. 9. Metoprolol 50 mg p.o. b.i.d.  10. Hydralazine 20 mg p.o. q.i.d.   DIET:  Low sodium, ADA diet.   ACTIVITY: As tolerated.   FOLLOWUP: The patient should see [facility] in one week and Dr. Rockey Situ on 03/05.   CODE STATUS:  FULL CODE.  Thank you for allowing me to participate in the care of this patient.  TOTAL TIME SPENT ON DISCHARGE: 45 minutes.  ____________________________ Judeth Horn Royden Purl, MD aaf:bjt D: 07/28/2011 20:52:36 ET T: 07/29/2011 14:39:23 ET JOB#: [id number]  cc: Mike Craze A. Royden Purl, MD, <Dictator> [facility] Joaquin Bend MD ELECTRONICALLY SIGNED 08/01/2011 22:53

## 2014-10-06 ENCOUNTER — Encounter: Payer: Self-pay | Admitting: Emergency Medicine

## 2014-10-06 ENCOUNTER — Emergency Department: Payer: Medicare Other

## 2014-10-06 ENCOUNTER — Emergency Department
Admission: EM | Admit: 2014-10-06 | Discharge: 2014-10-07 | Disposition: A | Discharge status: Home / Self Care | Payer: Medicare Other | Attending: Emergency Medicine | Admitting: Emergency Medicine

## 2014-10-06 DIAGNOSIS — Z79899 Other long term (current) drug therapy: Secondary | ICD-10-CM | POA: Insufficient documentation

## 2014-10-06 DIAGNOSIS — I10 Essential (primary) hypertension: Secondary | ICD-10-CM | POA: Insufficient documentation

## 2014-10-06 DIAGNOSIS — F17210 Nicotine dependence, cigarettes, uncomplicated: Secondary | ICD-10-CM | POA: Diagnosis not present

## 2014-10-06 DIAGNOSIS — E119 Type 2 diabetes mellitus without complications: Secondary | ICD-10-CM | POA: Insufficient documentation

## 2014-10-06 DIAGNOSIS — R51 Headache: Secondary | ICD-10-CM | POA: Diagnosis not present

## 2014-10-06 DIAGNOSIS — R079 Chest pain, unspecified: Secondary | ICD-10-CM | POA: Insufficient documentation

## 2014-10-06 DIAGNOSIS — Z720 Tobacco use: Secondary | ICD-10-CM | POA: Diagnosis not present

## 2014-10-06 DIAGNOSIS — J45909 Unspecified asthma, uncomplicated: Secondary | ICD-10-CM | POA: Diagnosis not present

## 2014-10-06 HISTORY — DX: Type 2 diabetes mellitus without complications: E11.9

## 2014-10-06 HISTORY — DX: Essential (primary) hypertension: I10

## 2014-10-06 HISTORY — DX: Unspecified asthma, uncomplicated: J45.909

## 2014-10-06 MED ORDER — NITROGLYCERIN 2 % TD OINT
1.0000 [in_us] | TOPICAL_OINTMENT | Freq: Once | TRANSDERMAL | Status: AC
  Administered 2014-10-07: 1 [in_us] via TOPICAL

## 2014-10-06 MED ORDER — ASPIRIN EC 325 MG PO TBEC
325.0000 mg | DELAYED_RELEASE_TABLET | Freq: Once | ORAL | Status: AC
  Administered 2014-10-07: 325 mg via ORAL
  Filled 2014-10-06: qty 1

## 2014-10-06 NOTE — ED Notes (Signed)
Presents via EMS for evaluation of 9/10chest pain and headache since this 9:50 am, states "it came and went away, then began again @ 9:10pm. Received A&O*3 speaking full sentences.

## 2014-10-06 NOTE — ED Provider Notes (Signed)
[facility] Emergency Department Provider Note    ____________________________________________  Time seen: 11:15 PM  I have reviewed the triage vital signs and the nursing notes.   HISTORY  Chief Complaint Chest Pain and Headache       HPI Dawn Alvarez is a 59 y.o. female presents with central sharp chest pain times one day that is nonradiating. Current pain score 9 out of 10. Patient denies any modifying factors. Of note patient noted to be markedly hypertensive on presentation to the room BP 225/111. She has history of hypertension and diabetes . Patient states that she is compliant with her hydralazine in addition patient also complains of generalized 6 out of 10 headache. Patient denies any focal neurological deficits. Patient denies any gait instability.     Past Medical History  Diagnosis Date  . Asthma   . Hypertension   . Diabetes mellitus without complication     There are no active problems to display for this patient.   No past surgical history on file.  Current Outpatient Rx  Name  Route  Sig  Dispense  Refill  . glyBURIDE-metformin (GLUCOVANCE) 2.5-500 MG per tablet   Oral   Take 1 tablet by mouth 2 (two) times daily.         . hydrALAZINE (APRESOLINE) 25 MG tablet   Oral   Take 2 tablets (50 mg total) by mouth 4 (four) times daily.   30 tablet   0     Allergies Review of patient's allergies indicates no known allergies.  Family History  Problem Relation Age of Onset  . Diabetes Mother   . Hypertension Mother   . Hypertension Father   . Diabetes Father     Social History History  Substance Use Topics  . Smoking status: Current Every Day Smoker -- 1.00 packs/day for 2 years    Types: Cigarettes  . Smokeless tobacco: Not on file  . Alcohol Use: 1.2 oz/week    2 Cans of beer per week    Review of Systems Lead Constitutional: Negative for fever. Eyes: Negative for visual changes. ENT: Negative for sore  throat. Cardiovascular: Positive for chest pain. Respiratory: Negative for shortness of breath. Gastrointestinal: Negative for abdominal pain, vomiting and diarrhea. Genitourinary: Negative for dysuria. Musculoskeletal: Negative for back pain. Skin: Negative for rash. Neurological: Negative for headaches, focal weakness or numbness.   10-point ROS otherwise negative.  ____________________________________________   PHYSICAL EXAM:  VITAL SIGNS: ED Triage Vitals  Enc Vitals Group     BP --      Pulse --      Resp --      Temp --      Temp src --      SpO2 --      Weight 10/06/14 2203 166 lb (75.297 kg)     Height 10/06/14 2203 5\' 4"  (1.626 m)     Head Cir --      Peak Flow --      Pain Score 10/06/14 2155 9     Pain Loc --      Pain Edu? --      Excl. in [place]? --     Constitutional: Alert and oriented. Well appearing and in no distress. Eyes: Conjunctivae are normal. PERRL. Normal extraocular movements. ENT   Head: Normocephalic and atraumatic.   Nose: No congestion/rhinnorhea.   Mouth/Throat: Mucous membranes are moist.   Neck: No stridor. Hematological/Lymphatic/Immunilogical: No cervical lymphadenopathy. Cardiovascular: Normal rate, regular rhythm. Normal  and symmetric distal pulses are present in all extremities. No murmurs, rubs, or gallops. Respiratory: Normal respiratory effort without tachypnea nor retractions. Breath sounds are clear and equal bilaterally. No wheezes/rales/rhonchi. Gastrointestinal: Soft and nontender. No distention. No abdominal bruits. There is no CVA tenderness. Genitourinary: Deferred Musculoskeletal: Nontender with normal range of motion in all extremities. No joint effusions.  No lower extremity tenderness nor edema. Neurologic:  Normal speech and language. No gross focal neurologic deficits are appreciated. Speech is normal. No gait instability. Skin:  Skin is warm, dry and intact. No rash noted. Psychiatric: Mood and affect  are normal. Speech and behavior are normal. Patient exhibits appropriate insight and judgment.  ____________________________________________    LABS (pertinent positives/negatives)  Labs Reviewed  COMPREHENSIVE METABOLIC PANEL - Abnormal; Notable for the following:    Potassium 3.0 (*)    Glucose, Bld 266 (*)    All other components within normal limits  URINALYSIS COMPLETEWITH MICROSCOPIC ([facility])  - Abnormal; Notable for the following:    Color, Urine STRAW (*)    APPearance CLEAR (*)    Glucose, UA >500 (*)    Specific Gravity, Urine 1.002 (*)    Bacteria, UA RARE (*)    Squamous Epithelial / LPF 0-5 (*)    All other components within normal limits  CBC  MAGNESIUM  TROPONIN I  TROPONIN I     ____________________________________________   EKG   Date: 10/07/2014  Rate: 92  Rhythm: normal sinus rhythm  QRS Axis: normal  Intervals: normal  ST/T Wave abnormalities: normal  Conduction Disutrbances: none  Narrative Interpretation: unremarkable      ____________________________________________    RADIOLOGY  CT head negative  ____________________________________________     ____________________________________________   INITIAL IMPRESSION / ASSESSMENT AND PLAN / ED COURSE  Pertinent labs & imaging results that were available during my care of the patient were reviewed by me and considered in my medical decision making (see chart for details).  Even markedly elevated blood pressure patient received nitroglycerin paste 1 inch with improvement in BP 173/95. Patient's chest pain and headache resolved with improvement in blood pressure. Of note lab data unremarkable. Including negative troponin 2 CT scan of the head also negative. Will increase patient's hydralazine dose at home for optimal blood pressure control patient advised to follow up with PMD within 48 hours  ____________________________________________   FINAL CLINICAL IMPRESSION(S) / ED  DIAGNOSES  Final diagnoses:  Nonspecific chest pain  Hypertension, essential     Gregor Hams, MD 10/07/14 [phone]

## 2014-10-07 ENCOUNTER — Emergency Department: Payer: Medicare Other

## 2014-10-07 DIAGNOSIS — R51 Headache: Secondary | ICD-10-CM | POA: Diagnosis not present

## 2014-10-07 LAB — COMPREHENSIVE METABOLIC PANEL
ALT: 22 U/L (ref 14–54)
AST: 30 U/L (ref 15–41)
Albumin: 3.9 g/dL (ref 3.5–5.0)
Alkaline Phosphatase: 43 U/L (ref 38–126)
Anion gap: 12 (ref 5–15)
BILIRUBIN TOTAL: 0.5 mg/dL (ref 0.3–1.2)
BUN: 10 mg/dL (ref 6–20)
CALCIUM: 9.2 mg/dL (ref 8.9–10.3)
CHLORIDE: 102 mmol/L (ref 101–111)
CO2: 24 mmol/L (ref 22–32)
Creatinine, Ser: 0.58 mg/dL (ref 0.44–1.00)
GLUCOSE: 266 mg/dL — AB (ref 65–99)
Potassium: 3 mmol/L — ABNORMAL LOW (ref 3.5–5.1)
Sodium: 138 mmol/L (ref 135–145)
Total Protein: 8.1 g/dL (ref 6.5–8.1)

## 2014-10-07 LAB — URINALYSIS COMPLETE WITH MICROSCOPIC (ARMC ONLY)
Bilirubin Urine: NEGATIVE
Glucose, UA: >500 mg/dL — AB
Hgb urine dipstick: NEGATIVE
Ketones, ur: NEGATIVE mg/dL
LEUKOCYTES UA: NEGATIVE
Nitrite: NEGATIVE
PROTEIN: NEGATIVE mg/dL
Specific Gravity, Urine: 1.002 — ABNORMAL LOW (ref 1.005–1.030)
pH: 6 (ref 5.0–8.0)

## 2014-10-07 LAB — CBC
HCT: 41.4 % (ref 35.0–47.0)
HEMOGLOBIN: 13.8 g/dL (ref 12.0–16.0)
MCH: 27.7 pg (ref 26.0–34.0)
MCHC: 33.3 g/dL (ref 32.0–36.0)
MCV: 83.4 fL (ref 80.0–100.0)
Platelets: 249 10*3/uL (ref 150–440)
RBC: 4.97 MIL/uL (ref 3.80–5.20)
RDW: 14.2 % (ref 11.5–14.5)
WBC: 7.7 10*3/uL (ref 3.6–11.0)

## 2014-10-07 LAB — TROPONIN I
TROPONIN I: 0.03 ng/mL (ref <0.031)
Troponin I: <0.03 ng/mL (ref <0.031)

## 2014-10-07 LAB — MAGNESIUM: Magnesium: 1.8 mg/dL (ref 1.7–2.4)

## 2014-10-07 MED ORDER — NITROGLYCERIN 2 % TD OINT
TOPICAL_OINTMENT | TRANSDERMAL | Status: AC
  Filled 2014-10-07: qty 1

## 2014-10-07 MED ORDER — HYDRALAZINE HCL 25 MG PO TABS
50.0000 mg | ORAL_TABLET | Freq: Four times a day (QID) | ORAL | Status: DC
Start: 2014-10-07 — End: 2015-01-30

## 2014-10-07 MED ORDER — ASPIRIN 81 MG PO CHEW
CHEWABLE_TABLET | ORAL | Status: AC
  Filled 2014-10-07: qty 4

## 2014-10-07 NOTE — Discharge Instructions (Signed)
Chest Pain (Nonspecific) °It is often hard to give a specific diagnosis for the cause of chest pain. There is always a chance that your pain could be related to something serious, such as a heart attack or a blood clot in the lungs. You need to follow up with your health care provider for further evaluation. °CAUSES  °· Heartburn. °· Pneumonia or bronchitis. °· Anxiety or stress. °· Inflammation around your heart (pericarditis) or lung (pleuritis or pleurisy). °· A blood clot in the lung. °· A collapsed lung (pneumothorax). It can develop suddenly on its own (spontaneous pneumothorax) or from trauma to the chest. °· Shingles infection (herpes zoster virus). °The chest wall is composed of bones, muscles, and cartilage. Any of these can be the source of the pain. °· The bones can be bruised by injury. °· The muscles or cartilage can be strained by coughing or overwork. °· The cartilage can be affected by inflammation and become sore (costochondritis). °DIAGNOSIS  °Lab tests or other studies may be needed to find the cause of your pain. Your health care provider may have you take a test called an ambulatory electrocardiogram (ECG). An ECG records your heartbeat patterns over a 24-hour period. You may also have other tests, such as: °· Transthoracic echocardiogram (TTE). During echocardiography, sound waves are used to evaluate how blood flows through your heart. °· Transesophageal echocardiogram (TEE). °· Cardiac monitoring. This allows your health care provider to monitor your heart rate and rhythm in real time. °· Holter monitor. This is a portable device that records your heartbeat and can help diagnose heart arrhythmias. It allows your health care provider to track your heart activity for several days, if needed. °· Stress tests by exercise or by giving medicine that makes the heart beat faster. °TREATMENT  °· Treatment depends on what may be causing your chest pain. Treatment may include: °· Acid blockers for  heartburn. °· Anti-inflammatory medicine. °· Pain medicine for inflammatory conditions. °· Antibiotics if an infection is present. °· You may be advised to change lifestyle habits. This includes stopping smoking and avoiding alcohol, caffeine, and chocolate. °· You may be advised to keep your head raised (elevated) when sleeping. This reduces the chance of acid going backward from your stomach into your esophagus. °Most of the time, nonspecific chest pain will improve within 2-3 days with rest and mild pain medicine.  °HOME CARE INSTRUCTIONS  °· If antibiotics were prescribed, take them as directed. Finish them even if you start to feel better. °· For the next few days, avoid physical activities that bring on chest pain. Continue physical activities as directed. °· Do not use any tobacco products, including cigarettes, chewing tobacco, or electronic cigarettes. °· Avoid drinking alcohol. °· Only take medicine as directed by your health care provider. °· Follow your health care provider's suggestions for further testing if your chest pain does not go away. °· Keep any follow-up appointments you made. If you do not go to an appointment, you could develop lasting (chronic) problems with pain. If there is any problem keeping an appointment, call to reschedule. °SEEK MEDICAL CARE IF:  °· Your chest pain does not go away, even after treatment. °· You have a rash with blisters on your chest. °· You have a fever. °SEEK IMMEDIATE MEDICAL CARE IF:  °· You have increased chest pain or pain that spreads to your arm, neck, jaw, back, or abdomen. °· You have shortness of breath. °· You have an increasing cough, or you cough   up blood.  You have severe back or abdominal pain.  You feel nauseous or vomit.  You have severe weakness.  You faint.  You have chills. This is an emergency. Do not wait to see if the pain will go away. Get medical help at once. Call your local emergency services (911 in [place] Do not drive  yourself to the hospital. MAKE SURE YOU:   Understand these instructions.  Will watch your condition.  Will get help right away if you are not doing well or get worse. Document Released: 02/26/2005 Document Revised: 05/24/2013 Document Reviewed: 12/23/2007 [employer] Patient Information 2015 [place], [place]. This information is not intended to replace advice given to you by your health care provider. Make sure you discuss any questions you have with your health care provider.  Hypertension Hypertension, commonly called high blood pressure, is when the force of blood pumping through your arteries is too strong. Your arteries are the blood vessels that carry blood from your heart throughout your body. A blood pressure reading consists of a higher number over a lower number, such as 110/72. The higher number (systolic) is the pressure inside your arteries when your heart pumps. The lower number (diastolic) is the pressure inside your arteries when your heart relaxes. Ideally you want your blood pressure below 120/80. Hypertension forces your heart to work harder to pump blood. Your arteries may become narrow or stiff. Having hypertension puts you at risk for heart disease, stroke, and other problems.  RISK FACTORS Some risk factors for high blood pressure are controllable. Others are not.  Risk factors you cannot control include:   Race. You may be at higher risk if you are African American.  Age. Risk increases with age.  Gender. Men are at higher risk than women before age 17 years. After age 24, women are at higher risk than men. Risk factors you can control include:  Not getting enough exercise or physical activity.  Being overweight.  Getting too much fat, sugar, calories, or salt in your diet.  Drinking too much alcohol. SIGNS AND SYMPTOMS Hypertension does not usually cause signs or symptoms. Extremely high blood pressure (hypertensive crisis) may cause headache, anxiety, shortness  of breath, and nosebleed. DIAGNOSIS  To check if you have hypertension, your health care provider will measure your blood pressure while you are seated, with your arm held at the level of your heart. It should be measured at least twice using the same arm. Certain conditions can cause a difference in blood pressure between your right and left arms. A blood pressure reading that is higher than normal on one occasion does not mean that you need treatment. If one blood pressure reading is high, ask your health care provider about having it checked again. TREATMENT  Treating high blood pressure includes making lifestyle changes and possibly taking medicine. Living a healthy lifestyle can help lower high blood pressure. You may need to change some of your habits. Lifestyle changes may include:  Following the DASH diet. This diet is high in fruits, vegetables, and whole grains. It is low in salt, red meat, and added sugars.  Getting at least 2 hours of brisk physical activity every week.  Losing weight if necessary.  Not smoking.  Limiting alcoholic beverages.  Learning ways to reduce stress. If lifestyle changes are not enough to get your blood pressure under control, your health care provider may prescribe medicine. You may need to take more than one. Work closely with your health care provider  to understand the risks and benefits. HOME CARE INSTRUCTIONS  Have your blood pressure rechecked as directed by your health care provider.   Take medicines only as directed by your health care provider. Follow the directions carefully. Blood pressure medicines must be taken as prescribed. The medicine does not work as well when you skip doses. Skipping doses also puts you at risk for problems.   Do not smoke.   Monitor your blood pressure at home as directed by your health care provider. SEEK MEDICAL CARE IF:   You think you are having a reaction to medicines taken.  You have recurrent  headaches or feel dizzy.  You have swelling in your ankles.  You have trouble with your vision. SEEK IMMEDIATE MEDICAL CARE IF:  You develop a severe headache or confusion.  You have unusual weakness, numbness, or feel faint.  You have severe chest or abdominal pain.  You vomit repeatedly.  You have trouble breathing. MAKE SURE YOU:   Understand these instructions.  Will watch your condition.  Will get help right away if you are not doing well or get worse. Document Released: 05/19/2005 Document Revised: 10/03/2013 Document Reviewed: 03/11/2013 [facility] Patient Information 2015 [place], [place]. This information is not intended to replace advice given to you by your health care provider. Make sure you discuss any questions you have with your health care provider.

## 2014-10-07 NOTE — ED Notes (Signed)
Nitro paste removed.

## 2014-10-07 NOTE — ED Notes (Signed)
Discharge instructions reviewed with pt and significant other. Pt and so verbalize understanding of discharge instructions. Prescription reviewed with pt and so. E signature pad not working, no hard signature obtained.

## 2014-10-07 NOTE — ED Notes (Signed)
Patient transported to CT 

## 2014-10-09 DIAGNOSIS — I10 Essential (primary) hypertension: Secondary | ICD-10-CM | POA: Diagnosis not present

## 2014-10-09 DIAGNOSIS — I679 Cerebrovascular disease, unspecified: Secondary | ICD-10-CM | POA: Diagnosis not present

## 2014-10-09 DIAGNOSIS — E1165 Type 2 diabetes mellitus with hyperglycemia: Secondary | ICD-10-CM | POA: Diagnosis not present

## 2014-10-09 DIAGNOSIS — E782 Mixed hyperlipidemia: Secondary | ICD-10-CM | POA: Diagnosis not present

## 2014-10-09 DIAGNOSIS — R079 Chest pain, unspecified: Secondary | ICD-10-CM | POA: Diagnosis not present

## 2014-10-09 DIAGNOSIS — F17210 Nicotine dependence, cigarettes, uncomplicated: Secondary | ICD-10-CM | POA: Diagnosis not present

## 2014-10-19 DIAGNOSIS — R079 Chest pain, unspecified: Secondary | ICD-10-CM | POA: Diagnosis not present

## 2014-10-26 DIAGNOSIS — I679 Cerebrovascular disease, unspecified: Secondary | ICD-10-CM | POA: Diagnosis not present

## 2014-11-08 DIAGNOSIS — I10 Essential (primary) hypertension: Secondary | ICD-10-CM | POA: Diagnosis not present

## 2014-11-08 DIAGNOSIS — F17210 Nicotine dependence, cigarettes, uncomplicated: Secondary | ICD-10-CM | POA: Diagnosis not present

## 2014-11-08 DIAGNOSIS — E1165 Type 2 diabetes mellitus with hyperglycemia: Secondary | ICD-10-CM | POA: Diagnosis not present

## 2014-11-17 DIAGNOSIS — I10 Essential (primary) hypertension: Secondary | ICD-10-CM | POA: Diagnosis not present

## 2014-11-17 DIAGNOSIS — E1165 Type 2 diabetes mellitus with hyperglycemia: Secondary | ICD-10-CM | POA: Diagnosis not present

## 2014-11-17 DIAGNOSIS — B354 Tinea corporis: Secondary | ICD-10-CM | POA: Diagnosis not present

## 2014-11-17 DIAGNOSIS — F17210 Nicotine dependence, cigarettes, uncomplicated: Secondary | ICD-10-CM | POA: Diagnosis not present

## 2014-11-30 DIAGNOSIS — E1165 Type 2 diabetes mellitus with hyperglycemia: Secondary | ICD-10-CM | POA: Diagnosis not present

## 2014-11-30 DIAGNOSIS — G479 Sleep disorder, unspecified: Secondary | ICD-10-CM | POA: Diagnosis not present

## 2014-11-30 DIAGNOSIS — E782 Mixed hyperlipidemia: Secondary | ICD-10-CM | POA: Diagnosis not present

## 2014-11-30 DIAGNOSIS — I10 Essential (primary) hypertension: Secondary | ICD-10-CM | POA: Diagnosis not present

## 2014-12-26 DIAGNOSIS — G479 Sleep disorder, unspecified: Secondary | ICD-10-CM | POA: Diagnosis not present

## 2014-12-26 DIAGNOSIS — G4710 Hypersomnia, unspecified: Secondary | ICD-10-CM | POA: Diagnosis not present

## 2015-01-05 ENCOUNTER — Other Ambulatory Visit: Payer: Self-pay

## 2015-01-05 ENCOUNTER — Emergency Department: Payer: Medicare Other

## 2015-01-05 ENCOUNTER — Emergency Department
Admission: EM | Admit: 2015-01-05 | Discharge: 2015-01-05 | Disposition: A | Discharge status: Home / Self Care | Payer: Medicare Other | Attending: Emergency Medicine | Admitting: Emergency Medicine

## 2015-01-05 ENCOUNTER — Encounter: Payer: Self-pay | Admitting: Emergency Medicine

## 2015-01-05 DIAGNOSIS — R4182 Altered mental status, unspecified: Secondary | ICD-10-CM | POA: Insufficient documentation

## 2015-01-05 DIAGNOSIS — Z720 Tobacco use: Secondary | ICD-10-CM | POA: Diagnosis not present

## 2015-01-05 DIAGNOSIS — I10 Essential (primary) hypertension: Secondary | ICD-10-CM | POA: Diagnosis not present

## 2015-01-05 DIAGNOSIS — Z79899 Other long term (current) drug therapy: Secondary | ICD-10-CM | POA: Diagnosis not present

## 2015-01-05 DIAGNOSIS — R569 Unspecified convulsions: Secondary | ICD-10-CM | POA: Insufficient documentation

## 2015-01-05 DIAGNOSIS — E119 Type 2 diabetes mellitus without complications: Secondary | ICD-10-CM | POA: Insufficient documentation

## 2015-01-05 DIAGNOSIS — R55 Syncope and collapse: Secondary | ICD-10-CM | POA: Diagnosis present

## 2015-01-05 LAB — URINALYSIS COMPLETE WITH MICROSCOPIC (ARMC ONLY)
BILIRUBIN URINE: NEGATIVE
Bacteria, UA: NONE SEEN
Glucose, UA: 150 mg/dL — AB
Hgb urine dipstick: NEGATIVE
Ketones, ur: NEGATIVE mg/dL
Leukocytes, UA: NEGATIVE
Nitrite: NEGATIVE
Protein, ur: NEGATIVE mg/dL
SPECIFIC GRAVITY, URINE: 1.01 (ref 1.005–1.030)
pH: 7 (ref 5.0–8.0)

## 2015-01-05 LAB — CBC
HCT: 40.8 % (ref 35.0–47.0)
Hemoglobin: 13.6 g/dL (ref 12.0–16.0)
MCH: 27.6 pg (ref 26.0–34.0)
MCHC: 33.3 g/dL (ref 32.0–36.0)
MCV: 82.9 fL (ref 80.0–100.0)
Platelets: 229 10*3/uL (ref 150–440)
RBC: 4.92 MIL/uL (ref 3.80–5.20)
RDW: 14.9 % — ABNORMAL HIGH (ref 11.5–14.5)
WBC: 7.9 10*3/uL (ref 3.6–11.0)

## 2015-01-05 LAB — URINE DRUG SCREEN, QUALITATIVE (ARMC ONLY)
Amphetamines, Ur Screen: NOT DETECTED
Barbiturates, Ur Screen: NOT DETECTED
Benzodiazepine, Ur Scrn: NOT DETECTED
Cannabinoid 50 Ng, Ur ~~LOC~~: NOT DETECTED
Cocaine Metabolite,Ur ~~LOC~~: NOT DETECTED
MDMA (Ecstasy)Ur Screen: NOT DETECTED
METHADONE SCREEN, URINE: NOT DETECTED
Opiate, Ur Screen: NOT DETECTED
Phencyclidine (PCP) Ur S: NOT DETECTED
Tricyclic, Ur Screen: NOT DETECTED

## 2015-01-05 LAB — BASIC METABOLIC PANEL
ANION GAP: 9 (ref 5–15)
BUN: 13 mg/dL (ref 6–20)
CALCIUM: 9.6 mg/dL (ref 8.9–10.3)
CHLORIDE: 102 mmol/L (ref 101–111)
CO2: 27 mmol/L (ref 22–32)
Creatinine, Ser: 0.84 mg/dL (ref 0.44–1.00)
GFR calc Af Amer: >60 mL/min (ref >60)
GFR calc non Af Amer: >60 mL/min (ref >60)
Glucose, Bld: 196 mg/dL — ABNORMAL HIGH (ref 65–99)
Potassium: 4 mmol/L (ref 3.5–5.1)
Sodium: 138 mmol/L (ref 135–145)

## 2015-01-05 MED ORDER — LEVETIRACETAM 500 MG PO TABS
500.0000 mg | ORAL_TABLET | Freq: Once | ORAL | Status: AC
  Administered 2015-01-05: 500 mg via ORAL
  Filled 2015-01-05: qty 1

## 2015-01-05 MED ORDER — LEVETIRACETAM 500 MG PO TABS: 500.0000 mg | ORAL_TABLET | Freq: Two times a day (BID) | ORAL | Status: DC

## 2015-01-05 NOTE — ED Provider Notes (Signed)
[facility] Emergency Department Provider Note   ____________________________________________  Time seen: 5 PM I have reviewed the triage vital signs and the triage nursing note.  HISTORY  Chief Complaint Loss of Consciousness and Altered Mental Status   Historian Patient  HPI Dawn Alvarez is a 59 y.o. female who was riding in the car today when she had an episode where she was altered and has no memory of this. Her friend who is driving reported to EMS that the patient was not speaking, and had stopped midsentence and then had a bowel movement in the car. Patient had altered mental status for a little while, even after she came around. The patient has history of chronic hypertension and reports that a blood pressure of 200/100 is normal for her. Currently the patient in the emergency department has no altered mental status, is back to her mental status baseline. Her mother states that she did have a history of staring spells when she was about 59 years old which was never medicated.   Past Medical History  Diagnosis Date  . Asthma   . Hypertension   . Diabetes mellitus without complication     There are no active problems to display for this patient.   History reviewed. No pertinent past surgical history.  Current Outpatient Rx  Name  Route  Sig  Dispense  Refill  . bisoprolol-hydrochlorothiazide (ZIAC) 2.5-6.25 MG per tablet   Oral   Take 1 tablet by mouth daily.         Marland Kitchen glyBURIDE-metformin (GLUCOVANCE) 2.5-500 MG per tablet   Oral   Take 2 tablets by mouth 2 (two) times daily.          . hydrALAZINE (APRESOLINE) 25 MG tablet   Oral   Take 25 mg by mouth 2 (two) times daily. Pt takes with a 50mg  tablet.         . hydrALAZINE (APRESOLINE) 50 MG tablet   Oral   Take 50 mg by mouth 2 (two) times daily. Pt takes with a 25mg  tablet.         Marland Kitchen lisinopril (PRINIVIL,ZESTRIL) 20 MG tablet   Oral   Take 20 mg by mouth daily.          . pravastatin (PRAVACHOL) 40 MG tablet   Oral   Take 40 mg by mouth at bedtime.         Marland Kitchen zolpidem (AMBIEN) 5 MG tablet   Oral   Take 5 mg by mouth at bedtime as needed for sleep.         . hydrALAZINE (APRESOLINE) 25 MG tablet   Oral   Take 2 tablets (50 mg total) by mouth 4 (four) times daily. Patient not taking: Reported on 01/05/2015   30 tablet   0   . levETIRAcetam (KEPPRA) 500 MG tablet   Oral   Take 1 tablet (500 mg total) by mouth 2 (two) times daily.   60 tablet   0     Allergies Review of patient's allergies indicates no known allergies.  Family History  Problem Relation Age of Onset  . Diabetes Mother   . Hypertension Mother   . Hypertension Father   . Diabetes Father     Social History History  Substance Use Topics  . Smoking status: Current Every Day Smoker -- 1.00 packs/day for 2 years    Types: Cigarettes  . Smokeless tobacco: Not on file  . Alcohol Use: 1.2 oz/week    2  Cans of beer per week    Review of Systems  Constitutional: Negative for fever. Eyes: Negative for visual changes. ENT: Negative for sore throat. Cardiovascular: Negative for chest pain. Respiratory: Negative for shortness of breath. Gastrointestinal: Negative for abdominal pain, vomiting and diarrhea. Genitourinary: Negative for dysuria. Musculoskeletal: Negative for back pain. Skin: Negative for rash. Neurological: Negative for  focal weakness or numbness. 10 point Review of Systems otherwise negative ____________________________________________   PHYSICAL EXAM:  VITAL SIGNS: ED Triage Vitals  Enc Vitals Group     BP 01/05/15 1538 214/103 mmHg     Pulse Rate 01/05/15 1538 95     Resp 01/05/15 1538 20     Temp 01/05/15 1538 98.6 F (37 C)     Temp Source 01/05/15 1538 Oral     SpO2 01/05/15 1538 98 %     Weight 01/05/15 1538 163 lb (73.936 kg)     Height 01/05/15 1538 5\' 1"  (1.549 m)     Head Cir --      Peak Flow --      Pain Score 01/05/15 1538 8      Pain Loc --      Pain Edu? --      Excl. in [place]? --      Constitutional: Alert and oriented. Well appearing and in no distress. Eyes: Conjunctivae are normal. PERRL. Normal extraocular movements. ENT   Head: Normocephalic and atraumatic.   Nose: No congestion/rhinnorhea.   Mouth/Throat: Mucous membranes are moist.   Neck: No stridor. Cardiovascular/Chest: Normal rate, regular rhythm.  No murmurs, rubs, or gallops. Respiratory: Normal respiratory effort without tachypnea nor retractions. Breath sounds are clear and equal bilaterally. No wheezes/rales/rhonchi. Gastrointestinal: Soft. No distention, no guarding, no rebound. Nontender   Genitourinary/rectal:Deferred Musculoskeletal: Nontender with normal range of motion in all extremities. No joint effusions.  No lower extremity tenderness nor edema. Neurologic:  Normal speech and language. No gross or focal neurologic deficits are appreciated. Skin:  Skin is warm, dry and intact. No rash noted. Psychiatric: Mood and affect are normal. Speech and behavior are normal. Patient exhibits appropriate insight and judgment.  ____________________________________________   EKG I, Lisa Roca, MD, the attending physician have personally viewed and interpreted all ECGs.  96 bpm. Normal sinus rhythm. Narrow QRS. Normal axis. Nonspecific T wave. ____________________________________________  LABS (pertinent positives/negatives)  Urinalysis negative Urine drug screen negative Basic metabolic panel without significant abnormality CBC without significant abnormality  ____________________________________________  RADIOLOGY All Xrays were viewed by me. Imaging interpreted by Radiologist.  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  Given the history I suspect seizure more likely than syncope, intracranial abnormality, electrolyte abnormality, or infection. Her exam and evaluation are reassuring, as patient is back to mental status baseline. Her blood pressure has been elevated 200 over 100s, however this is normal for her. She is going to take a dose of her when necessary hydralazine.  I do not suspect stroke as likely. I discussed the case with the on-call neurologist given that I suspect these 2 episodes were likely seizure. Especially given  the fact that she's had seizure it sounds like in the past. He recommended no bolus dosing of Keppra, however start on 500 mg twice a day. I gave her seizure precautions. She is to follow up with primary care physician and neurologist.   Patient / Family / Caregiver informed of clinical course, medical decision-making process, and agree with plan.   I discussed return precautions, follow-up instructions, and discharged instructions with patient and/or family.  ___________________________________________   FINAL CLINICAL IMPRESSION(S) / ED DIAGNOSES   Final diagnoses:  Seizure  Chronic hypertension    FOLLOW UP  Referred to: Primary care physician in one week, and neurologist next available-referred to Dr. Cindee Lame, MD 01/05/15 [phone]

## 2015-01-05 NOTE — ED Notes (Signed)
Spoke with Dr Reita Cliche regarding pt BP, pt took own supply of hydralazine before D/C, pt is to take 3x a day, has only took 1.

## 2015-01-05 NOTE — ED Notes (Signed)
Patient to ED with sister who reports patient was riding back home from [place] with a friend and had syncopal episode x3, patient did not recall what happened afterwards and is slow to answer questions now.

## 2015-01-05 NOTE — Discharge Instructions (Signed)
Examiner evaluation are reassuring. I suspect he had a seizure today. After discussion with the neurologist, they recommend starting you on Keppra 500 mg twice daily. He needs to follow up with your primary doctor this week, and call neurology for an appointment for the next available. Return to the emergency department for any worsening condition including fever, altered mental status, weakness, numbness, speech problems, passing out, or any seizure activity that lasts longer than 5 minutes.  No driving, climbing to heights, or operate any machinery until you are evaluated and cleared by the neurologist.   Seizure, Adult A seizure is abnormal electrical activity in the brain. Seizures usually last from 30 seconds to 2 minutes. There are various types of seizures. Before a seizure, you may have a warning sensation (aura) that a seizure is about to occur. An aura may include the following symptoms:   Fear or anxiety.  Nausea.  Feeling like the room is spinning (vertigo).  Vision changes, such as seeing flashing lights or spots. Common symptoms during a seizure include:  A change in attention or behavior (altered mental status).  Convulsions with rhythmic jerking movements.  Drooling.  Rapid eye movements.  Grunting.  Loss of bladder and bowel control.  Bitter taste in the mouth.  Tongue biting. After a seizure, you may feel confused and sleepy. You may also have an injury resulting from convulsions during the seizure. HOME CARE INSTRUCTIONS   If you are given medicines, take them exactly as prescribed by your health care provider.  Keep all follow-up appointments as directed by your health care provider.  Do not swim or drive or engage in risky activity during which a seizure could cause further injury to you or others until your health care provider says it is OK.  Get adequate rest.  Teach friends and family what to do if you have a seizure. They should:  Lay you on the  ground to prevent a fall.  Put a cushion under your head.  Loosen any tight clothing around your neck.  Turn you on your side. If vomiting occurs, this helps keep your airway clear.  Stay with you until you recover.  Know whether or not you need emergency care. SEEK IMMEDIATE MEDICAL CARE IF:  The seizure lasts longer than 5 minutes.  The seizure is severe or you do not wake up immediately after the seizure.  You have an altered mental status after the seizure.  You are having more frequent or worsening seizures. Someone should drive you to the emergency department or call local emergency services (911 in [place] MAKE SURE YOU:  Understand these instructions.  Will watch your condition.  Will get help right away if you are not doing well or get worse. Document Released: 05/16/2000 Document Revised: 03/09/2013 Document Reviewed: 12/29/2012 [facility] Patient Information 2015 [place], [place]. This information is not intended to replace advice given to you by your health care provider. Make sure you discuss any questions you have with your health care provider.

## 2015-01-09 DIAGNOSIS — E782 Mixed hyperlipidemia: Secondary | ICD-10-CM | POA: Diagnosis not present

## 2015-01-09 DIAGNOSIS — I10 Essential (primary) hypertension: Secondary | ICD-10-CM | POA: Diagnosis not present

## 2015-01-09 DIAGNOSIS — E1165 Type 2 diabetes mellitus with hyperglycemia: Secondary | ICD-10-CM | POA: Diagnosis not present

## 2015-01-11 DIAGNOSIS — I10 Essential (primary) hypertension: Secondary | ICD-10-CM | POA: Diagnosis not present

## 2015-01-11 DIAGNOSIS — E1165 Type 2 diabetes mellitus with hyperglycemia: Secondary | ICD-10-CM | POA: Diagnosis not present

## 2015-01-14 ENCOUNTER — Emergency Department
Admission: EM | Admit: 2015-01-14 | Discharge: 2015-01-14 | Disposition: A | Discharge status: Home / Self Care | Payer: Medicare Other | Attending: Emergency Medicine | Admitting: Emergency Medicine

## 2015-01-14 ENCOUNTER — Emergency Department: Payer: Medicare Other

## 2015-01-14 ENCOUNTER — Encounter: Payer: Self-pay | Admitting: Emergency Medicine

## 2015-01-14 DIAGNOSIS — R51 Headache: Secondary | ICD-10-CM | POA: Diagnosis not present

## 2015-01-14 DIAGNOSIS — I10 Essential (primary) hypertension: Secondary | ICD-10-CM | POA: Insufficient documentation

## 2015-01-14 DIAGNOSIS — E119 Type 2 diabetes mellitus without complications: Secondary | ICD-10-CM | POA: Diagnosis not present

## 2015-01-14 DIAGNOSIS — Z720 Tobacco use: Secondary | ICD-10-CM | POA: Insufficient documentation

## 2015-01-14 DIAGNOSIS — Z79899 Other long term (current) drug therapy: Secondary | ICD-10-CM | POA: Diagnosis not present

## 2015-01-14 DIAGNOSIS — G44209 Tension-type headache, unspecified, not intractable: Secondary | ICD-10-CM | POA: Diagnosis not present

## 2015-01-14 HISTORY — DX: Hyperlipidemia, unspecified: E78.5

## 2015-01-14 MED ORDER — DIAZEPAM 5 MG PO TABS
5.0000 mg | ORAL_TABLET | Freq: Once | ORAL | Status: AC
  Administered 2015-01-14: 5 mg via ORAL
  Filled 2015-01-14: qty 1

## 2015-01-14 MED ORDER — DIAZEPAM 2 MG PO TABS: 2.0000 mg | ORAL_TABLET | Freq: Three times a day (TID) | ORAL | Status: DC | PRN

## 2015-01-14 NOTE — ED Notes (Signed)
MD at bedside. 

## 2015-01-14 NOTE — ED Provider Notes (Signed)
[facility] Emergency Department Provider Note  ____________________________________________  Time seen:  3:40 AM  I have reviewed the triage vital signs and the nursing notes.   HISTORY  Chief Complaint Headache  left-sided  HPI Dawn Alvarez is a 59 y.o. female presents with complaint of acute onset headache. This began at 1 AM. She was lying in bed. It happened suddenly and she reports it is 9 out of 10 on the pain scale. She reports she does not usually have headaches of this sort.  The patient was seen in the emergency department on August 5 for unclear symptoms that may have been seizure-like.  She had a negative CT head at that time.  She denies any nausea. She reports her extremities are working well. She does have neck pain currently.   Past Medical History  Diagnosis Date  . Asthma   . Hypertension   . Diabetes mellitus without complication   . Hyperlipemia     There are no active problems to display for this patient.   History reviewed. No pertinent past surgical history.  Current Outpatient Rx  Name  Route  Sig  Dispense  Refill  . bisoprolol-hydrochlorothiazide (ZIAC) 2.5-6.25 MG per tablet   Oral   Take 1 tablet by mouth daily.         . diazepam (VALIUM) 2 MG tablet   Oral   Take 1 tablet (2 mg total) by mouth every 8 (eight) hours as needed for anxiety or muscle spasms.   20 tablet   0   . glyBURIDE-metformin (GLUCOVANCE) 2.5-500 MG per tablet   Oral   Take 2 tablets by mouth 2 (two) times daily.          . hydrALAZINE (APRESOLINE) 25 MG tablet   Oral   Take 2 tablets (50 mg total) by mouth 4 (four) times daily. Patient not taking: Reported on 01/05/2015   30 tablet   0   . hydrALAZINE (APRESOLINE) 25 MG tablet   Oral   Take 25 mg by mouth 2 (two) times daily. Pt takes with a 50mg  tablet.         . hydrALAZINE (APRESOLINE) 50 MG tablet   Oral   Take 50 mg by mouth 2 (two) times daily. Pt takes with a 25mg   tablet.         . levETIRAcetam (KEPPRA) 500 MG tablet   Oral   Take 1 tablet (500 mg total) by mouth 2 (two) times daily.   60 tablet   0   . lisinopril (PRINIVIL,ZESTRIL) 20 MG tablet   Oral   Take 20 mg by mouth daily.         . pravastatin (PRAVACHOL) 40 MG tablet   Oral   Take 40 mg by mouth at bedtime.         Marland Kitchen zolpidem (AMBIEN) 5 MG tablet   Oral   Take 5 mg by mouth at bedtime as needed for sleep.           Allergies Review of patient's allergies indicates no known allergies.  Family History  Problem Relation Age of Onset  . Diabetes Mother   . Hypertension Mother   . Hypertension Father   . Diabetes Father     Social History Social History  Substance Use Topics  . Smoking status: Current Every Day Smoker -- 1.00 packs/day for 2 years    Types: Cigarettes  . Smokeless tobacco: None  . Alcohol Use: 1.2 oz/week  2 Cans of beer per week    Review of Systems  Constitutional: Negative for fever. ENT: Negative for sore throat. Cardiovascular: Negative for chest pain. Respiratory: Negative for shortness of breath. Gastrointestinal: Negative for abdominal pain, vomiting and diarrhea. Genitourinary: Negative for dysuria. Musculoskeletal: No myalgias or injuries. Skin: Negative for rash. Neurological: Positive for acute onset headache this evening. See history of present illness   10-point ROS otherwise negative.  ____________________________________________   PHYSICAL EXAM:  VITAL SIGNS: ED Triage Vitals  Enc Vitals Group     BP 01/14/15 0258 208/85 mmHg     Pulse Rate 01/14/15 0258 72     Resp 01/14/15 0258 20     Temp 01/14/15 0258 97.6 F (36.4 C)     Temp Source 01/14/15 0258 Oral     SpO2 01/14/15 0258 97 %     Weight 01/14/15 0258 162 lb (73.483 kg)     Height 01/14/15 0258 5\' 3"  (1.6 m)     Head Cir --      Peak Flow --      Pain Score 01/14/15 0301 10     Pain Loc --      Pain Edu? --      Excl. in [place]? --      Constitutional:  Alert and oriented. Communicative. No acute distress. She appears to be avoiding turning her neck and head to the left. ENT   Head: Normocephalic and atraumatic.   Nose: No congestion/rhinnorhea. Cervical: Extensive muscle tightness through her neck with tenderness on palpation. She has good range of motion though and is able to tilt back and looked down without much restriction. She is able to both the left, although it looks as though she is preferring to keep her head slightly to the right. Cardiovascular: Normal rate, regular rhythm, no murmur noted Respiratory:  Normal respiratory effort, no tachypnea.    Breath sounds are clear and equal bilaterally.  Gastrointestinal: Soft and nontender. No distention.  Back: No muscle spasm, no tenderness, no CVA tenderness. Musculoskeletal: No deformity noted. Nontender with normal range of motion in all extremities.  No noted edema. Neurologic:  Normal speech and language. No gross focal neurologic deficits are appreciated. Equal grip strength, no pronator drift, 5 or 5 strength in all 4 extremities. Skin:  Skin is warm, dry. No rash noted. Psychiatric: Mood and affect are normal. Speech and behavior are normal.  ____________________________________________   ____________________________________________    RADIOLOGY  CT head: IMPRESSION: 1. No acute intracranial pathology seen on CT. 2. Diffuse small vessel ischemic microangiopathy.  ____________________________________________   PROCEDURES    ____________________________________________   INITIAL IMPRESSION / ASSESSMENT AND PLAN / ED COURSE  Pertinent labs & imaging results that were available during my care of the patient were reviewed by me and considered in my medical decision making (see chart for details).  Patient with acute onset headache and hypertension. We will obtain a CT scan to evaluate for a possible acute bleed.  The patient does have  significant muscular spasm and stiffness in her neck. We will treat this with Valium. ----------------------------------------- 6:34 AM on 01/14/2015 -----------------------------------------  Head CT shows no acute change. At this time, the patient is feeling better.  I have discussed with her and with her sister the option of doing a lumbar puncture. I think this would be low yield for this patient. We have spoken about the risks and benefits of doing a lumbar puncture. She declines lumbar puncture this time.  The  sister raises the question of some abnormal finding on prior CTs. The patient has been discussing this with her regular physicians at [facility]. Reviewing the 2 recent CT scans that she has had, we note the cortical atrophy and the likelihood of prior old strokes. I have discussed these results with the patient and her sister.   We will continue Valium due to the muscle spasm in her neck. The patient take Tylenol as well. She will follow-up with her regular doctors at [facility]  ____________________________________________   FINAL CLINICAL IMPRESSION(S) / ED DIAGNOSES  Final diagnoses:  Tension-type headache, not intractable, unspecified chronicity pattern       Ahmed Prima, MD 01/14/15 [phone]

## 2015-01-14 NOTE — ED Notes (Signed)
Patient with complaint of headache that radiates to her neck that woke her from her sleep.

## 2015-01-14 NOTE — Discharge Instructions (Signed)
You have notable muscle spasm and discomfort in your neck. Her headache has improved with the use of Valium. Your head CT tonight is negative. We discussed the risk and benefits of either performing a lumbar puncture or not. We agreed not to perform a lumbar puncture this evening. Follow-up with your regular doctor for ongoing care. Return to the emergency department if you have a return of a severe headache, if you have any focal weakness, fever, or other urgent concerns.  General Headache Without Cause A general headache is pain or discomfort felt around the head or neck area. The cause may not be found.  HOME CARE   Keep all doctor visits.  Only take medicines as told by your doctor.  Lie down in a dark, quiet room when you have a headache.  Keep a journal to find out if certain things bring on headaches. For example, write down:  What you eat and drink.  How much sleep you get.  Any change to your diet or medicines.  Relax by getting a massage or doing other relaxing activities.  Put ice or heat packs on the head and neck area as told by your doctor.  Lessen stress.  Sit up straight. Do not tighten (tense) your muscles.  Quit smoking if you smoke.  Lessen how much alcohol you drink.  Lessen how much caffeine you drink, or stop drinking caffeine.  Eat and sleep on a regular schedule.  Get 7 to 9 hours of sleep, or as told by your doctor.  Keep lights dim if bright lights bother you or make your headaches worse. GET HELP RIGHT AWAY IF:   Your headache becomes really bad.  You have a fever.  You have a stiff neck.  You have trouble seeing.  Your muscles are weak, or you lose muscle control.  You lose your balance or have trouble walking.  You feel like you will pass out (faint), or you pass out.  You have really bad symptoms that are different than your first symptoms.  You have problems with the medicines given to you by your doctor.  Your medicines do not  work.  Your headache feels different than the other headaches.  You feel sick to your stomach (nauseous) or throw up (vomit). MAKE SURE YOU:   Understand these instructions.  Will watch your condition.  Will get help right away if you are not doing well or get worse. Document Released: 02/26/2008 Document Revised: 08/11/2011 Document Reviewed: 05/09/2011 [facility] Patient Information 2015 [place], [place]. This information is not intended to replace advice given to you by your health care provider. Make sure you discuss any questions you have with your health care provider.

## 2015-01-14 NOTE — ED Notes (Signed)
Reports headache since approx 1 am.  States radiating down her neck.

## 2015-01-16 DIAGNOSIS — Z8673 Personal history of transient ischemic attack (TIA), and cerebral infarction without residual deficits: Secondary | ICD-10-CM | POA: Diagnosis not present

## 2015-01-16 DIAGNOSIS — Z794 Long term (current) use of insulin: Secondary | ICD-10-CM | POA: Diagnosis not present

## 2015-01-16 DIAGNOSIS — I10 Essential (primary) hypertension: Secondary | ICD-10-CM | POA: Diagnosis not present

## 2015-01-16 DIAGNOSIS — E1165 Type 2 diabetes mellitus with hyperglycemia: Secondary | ICD-10-CM | POA: Diagnosis not present

## 2015-01-16 DIAGNOSIS — F17210 Nicotine dependence, cigarettes, uncomplicated: Secondary | ICD-10-CM | POA: Diagnosis not present

## 2015-01-18 DIAGNOSIS — Z8673 Personal history of transient ischemic attack (TIA), and cerebral infarction without residual deficits: Secondary | ICD-10-CM | POA: Diagnosis not present

## 2015-01-18 DIAGNOSIS — E1165 Type 2 diabetes mellitus with hyperglycemia: Secondary | ICD-10-CM | POA: Diagnosis not present

## 2015-01-18 DIAGNOSIS — F17210 Nicotine dependence, cigarettes, uncomplicated: Secondary | ICD-10-CM | POA: Diagnosis not present

## 2015-01-18 DIAGNOSIS — Z794 Long term (current) use of insulin: Secondary | ICD-10-CM | POA: Diagnosis not present

## 2015-01-18 DIAGNOSIS — I10 Essential (primary) hypertension: Secondary | ICD-10-CM | POA: Diagnosis not present

## 2015-01-23 DIAGNOSIS — Z794 Long term (current) use of insulin: Secondary | ICD-10-CM | POA: Diagnosis not present

## 2015-01-23 DIAGNOSIS — F17210 Nicotine dependence, cigarettes, uncomplicated: Secondary | ICD-10-CM | POA: Diagnosis not present

## 2015-01-23 DIAGNOSIS — E1165 Type 2 diabetes mellitus with hyperglycemia: Secondary | ICD-10-CM | POA: Diagnosis not present

## 2015-01-23 DIAGNOSIS — I10 Essential (primary) hypertension: Secondary | ICD-10-CM | POA: Diagnosis not present

## 2015-01-23 DIAGNOSIS — Z8673 Personal history of transient ischemic attack (TIA), and cerebral infarction without residual deficits: Secondary | ICD-10-CM | POA: Diagnosis not present

## 2015-01-25 DIAGNOSIS — Z8673 Personal history of transient ischemic attack (TIA), and cerebral infarction without residual deficits: Secondary | ICD-10-CM | POA: Diagnosis not present

## 2015-01-25 DIAGNOSIS — Z794 Long term (current) use of insulin: Secondary | ICD-10-CM | POA: Diagnosis not present

## 2015-01-25 DIAGNOSIS — E1165 Type 2 diabetes mellitus with hyperglycemia: Secondary | ICD-10-CM | POA: Diagnosis not present

## 2015-01-25 DIAGNOSIS — F17210 Nicotine dependence, cigarettes, uncomplicated: Secondary | ICD-10-CM | POA: Diagnosis not present

## 2015-01-25 DIAGNOSIS — G479 Sleep disorder, unspecified: Secondary | ICD-10-CM | POA: Diagnosis not present

## 2015-01-25 DIAGNOSIS — I10 Essential (primary) hypertension: Secondary | ICD-10-CM | POA: Diagnosis not present

## 2015-01-30 ENCOUNTER — Encounter: Payer: Self-pay | Admitting: Emergency Medicine

## 2015-01-30 ENCOUNTER — Emergency Department
Admission: EM | Admit: 2015-01-30 | Discharge: 2015-01-30 | Disposition: A | Discharge status: Home / Self Care | Payer: Medicare Other | Attending: Emergency Medicine | Admitting: Emergency Medicine

## 2015-01-30 ENCOUNTER — Emergency Department: Payer: Medicare Other

## 2015-01-30 DIAGNOSIS — E119 Type 2 diabetes mellitus without complications: Secondary | ICD-10-CM | POA: Diagnosis not present

## 2015-01-30 DIAGNOSIS — I10 Essential (primary) hypertension: Secondary | ICD-10-CM | POA: Insufficient documentation

## 2015-01-30 DIAGNOSIS — Z79899 Other long term (current) drug therapy: Secondary | ICD-10-CM | POA: Insufficient documentation

## 2015-01-30 DIAGNOSIS — R0789 Other chest pain: Secondary | ICD-10-CM | POA: Insufficient documentation

## 2015-01-30 DIAGNOSIS — Z720 Tobacco use: Secondary | ICD-10-CM | POA: Diagnosis not present

## 2015-01-30 DIAGNOSIS — R079 Chest pain, unspecified: Secondary | ICD-10-CM | POA: Diagnosis not present

## 2015-01-30 LAB — BASIC METABOLIC PANEL
ANION GAP: 9 (ref 5–15)
BUN: 16 mg/dL (ref 6–20)
CALCIUM: 10 mg/dL (ref 8.9–10.3)
CHLORIDE: 103 mmol/L (ref 101–111)
CO2: 27 mmol/L (ref 22–32)
Creatinine, Ser: 0.89 mg/dL (ref 0.44–1.00)
GFR calc non Af Amer: >60 mL/min (ref >60)
Glucose, Bld: 98 mg/dL (ref 65–99)
Potassium: 3.7 mmol/L (ref 3.5–5.1)
Sodium: 139 mmol/L (ref 135–145)

## 2015-01-30 LAB — HEPATIC FUNCTION PANEL
ALBUMIN: 4.4 g/dL (ref 3.5–5.0)
ALT: 22 U/L (ref 14–54)
AST: 24 U/L (ref 15–41)
Alkaline Phosphatase: 31 U/L — ABNORMAL LOW (ref 38–126)
BILIRUBIN TOTAL: 0.5 mg/dL (ref 0.3–1.2)
Bilirubin, Direct: <0.1 mg/dL — ABNORMAL LOW (ref 0.1–0.5)
TOTAL PROTEIN: 8.4 g/dL — AB (ref 6.5–8.1)

## 2015-01-30 LAB — LIPASE, BLOOD: Lipase: 36 U/L (ref 22–51)

## 2015-01-30 LAB — CBC
HCT: 39.9 % (ref 35.0–47.0)
HEMOGLOBIN: 13.2 g/dL (ref 12.0–16.0)
MCH: 27.6 pg (ref 26.0–34.0)
MCHC: 33.2 g/dL (ref 32.0–36.0)
MCV: 83.3 fL (ref 80.0–100.0)
Platelets: 235 10*3/uL (ref 150–440)
RBC: 4.79 MIL/uL (ref 3.80–5.20)
RDW: 14.3 % (ref 11.5–14.5)
WBC: 8.2 10*3/uL (ref 3.6–11.0)

## 2015-01-30 LAB — TROPONIN I

## 2015-01-30 MED ORDER — IBUPROFEN 600 MG PO TABS: 600.0000 mg | ORAL_TABLET | Freq: Three times a day (TID) | ORAL | Status: DC | PRN

## 2015-01-30 NOTE — ED Notes (Signed)
Pt reports coming to ER from home after being awoken with right sided chest pain that radiates to the right back. Pt denies that the pain radiates anywhere else. Pt denies any new medications. Pt denies knowing of any specific activity that may have caused pain. Pt did not take any medicines prior to coming to the ER.  Pt does reports a distant hx of asthma, but no recent problems with asthma. Pt does report smoking 1 cigarette per day.

## 2015-01-30 NOTE — Discharge Instructions (Signed)
You have notable tenderness of your right chest. This appears to be musculoskeletal. Your cardiac enzyme was negative. Her EKG did not show any acute changes. Take ibuprofen, 6 mg, 3 times a day, as needed. Follow-up with your regular doctor. Return to the emergency department if you have worsening pain, shortness of breath, nausea, vomiting, sweating, or other urgent concerns.  Chest Wall Pain Chest wall pain is pain in or around the bones and muscles of your chest. It may take up to 6 weeks to get better. It may take longer if you must stay physically active in your work and activities.  CAUSES  Chest wall pain may happen on its own. However, it may be caused by:  A viral illness like the flu.  Injury.  Coughing.  Exercise.  Arthritis.  Fibromyalgia.  Shingles. HOME CARE INSTRUCTIONS   Avoid overtiring physical activity. Try not to strain or perform activities that cause pain. This includes any activities using your chest or your abdominal and side muscles, especially if heavy weights are used.  Put ice on the sore area.  Put ice in a plastic bag.  Place a towel between your skin and the bag.  Leave the ice on for 15-20 minutes per hour while awake for the first 2 days.  Only take over-the-counter or prescription medicines for pain, discomfort, or fever as directed by your caregiver. SEEK IMMEDIATE MEDICAL CARE IF:   Your pain increases, or you are very uncomfortable.  You have a fever.  Your chest pain becomes worse.  You have new, unexplained symptoms.  You have nausea or vomiting.  You feel sweaty or lightheaded.  You have a cough with phlegm (sputum), or you cough up blood. MAKE SURE YOU:   Understand these instructions.  Will watch your condition.  Will get help right away if you are not doing well or get worse. Document Released: 05/19/2005 Document Revised: 08/11/2011 Document Reviewed: 01/13/2011 [facility] Patient Information 2015 [place], [place].  This information is not intended to replace advice given to you by your health care provider. Make sure you discuss any questions you have with your health care provider.

## 2015-01-30 NOTE — ED Provider Notes (Signed)
[facility] Emergency Department Provider Note  ____________________________________________  Time seen: 752  I have reviewed the triage vital signs and the nursing notes.   HISTORY  Chief Complaint Chest Pain     HPI Dawn Alvarez is a 59 y.o. female who woke up approximately 2 AM with pain in her right chest. She reports she has not had pain like this before. She denies shortness of breath or diaphoresis. Her husband reports she has been to the emergency department before with chest pain. The patient reports that chest pain was on the left and felt different. She does have pain with movement. She denies any antagonizing event that she knows of. The pain is better now than when she first woke up but continues.    Past Medical History  Diagnosis Date  . Asthma   . Hypertension   . Diabetes mellitus without complication   . Hyperlipemia     There are no active problems to display for this patient.   Past Surgical History  Procedure Laterality Date  . Cesarean section      Current Outpatient Rx  Name  Route  Sig  Dispense  Refill  . bisoprolol-hydrochlorothiazide (ZIAC) 2.5-6.25 MG per tablet   Oral   Take 1 tablet by mouth daily.         Marland Kitchen glyBURIDE-metformin (GLUCOVANCE) 2.5-500 MG per tablet   Oral   Take 2 tablets by mouth 2 (two) times daily.          . hydrALAZINE (APRESOLINE) 25 MG tablet   Oral   Take 25 mg by mouth 2 (two) times daily. Pt takes with a 50mg  tablet.         . hydrALAZINE (APRESOLINE) 50 MG tablet   Oral   Take 50 mg by mouth 2 (two) times daily. Pt takes with a 25mg  tablet.         . levETIRAcetam (KEPPRA) 500 MG tablet   Oral   Take 1 tablet (500 mg total) by mouth 2 (two) times daily.   60 tablet   0   . lisinopril (PRINIVIL,ZESTRIL) 20 MG tablet   Oral   Take 20 mg by mouth daily.         . pravastatin (PRAVACHOL) 40 MG tablet   Oral   Take 40 mg by mouth at bedtime.         Marland Kitchen zolpidem  (AMBIEN) 5 MG tablet   Oral   Take 5 mg by mouth at bedtime as needed for sleep.         . diazepam (VALIUM) 2 MG tablet   Oral   Take 1 tablet (2 mg total) by mouth every 8 (eight) hours as needed for anxiety or muscle spasms.   20 tablet   0   . ibuprofen (ADVIL,MOTRIN) 600 MG tablet   Oral   Take 1 tablet (600 mg total) by mouth every 8 (eight) hours as needed for moderate pain.   12 tablet   1     Allergies Review of patient's allergies indicates no known allergies.  Family History  Problem Relation Age of Onset  . Diabetes Mother   . Hypertension Mother   . Hypertension Father   . Diabetes Father     Social History Social History  Substance Use Topics  . Smoking status: Current Every Day Smoker -- 1.00 packs/day for 2 years    Types: Cigarettes  . Smokeless tobacco: None  . Alcohol Use: 1.2 oz/week  2 Cans of beer per week    Review of Systems  Constitutional: Negative for fever. ENT: Negative for sore throat. Cardiovascular: Positive for chest pain. Respiratory: Negative for shortness of breath. Gastrointestinal: Negative for abdominal pain, vomiting and diarrhea. Genitourinary: Negative for dysuria. Musculoskeletal: Chest pain on the right with movement.. Skin: Negative for rash. Neurological: Negative for headaches   10-point ROS otherwise negative.  ____________________________________________   PHYSICAL EXAM:  VITAL SIGNS: ED Triage Vitals  Enc Vitals Group     BP 01/30/15 0316 170/94 mmHg     Pulse Rate 01/30/15 0316 82     Resp 01/30/15 0316 18     Temp 01/30/15 0316 98.1 F (36.7 C)     Temp Source 01/30/15 0316 Oral     SpO2 01/30/15 0316 97 %     Weight 01/30/15 0316 162 lb (73.483 kg)     Height 01/30/15 0316 5\' 1"  (1.549 m)     Head Cir --      Peak Flow --      Pain Score 01/30/15 0315 8     Pain Loc --      Pain Edu? --      Excl. in [place]? --     Constitutional:  Alert and oriented. Well appearing and in no  distress. ENT   Head: Normocephalic and atraumatic.   Nose: No congestion/rhinnorhea.   Mouth/Throat: Mucous membranes are moist. Cardiovascular: Normal rate, regular rhythm, no murmur noted Chest wall:  Notable and exquisite tenderness through the right chest. This also wraps around and affects the right thoracic paraspinal muscle. Respiratory:  Normal respiratory effort, no tachypnea.    Breath sounds are clear and equal bilaterally.  Gastrointestinal: Soft and nontender. No distention.  Back: There is mild spasm with some tenderness to the right paraspinal muscle in the thoracic area. no CVA tenderness. Musculoskeletal: No deformity noted. Nontender with normal range of motion in all extremities.  No noted edema. Neurologic:  Normal speech and language. No gross focal neurologic deficits are appreciated.  Skin:  Skin is warm, dry. No rash noted. Psychiatric: Mood and affect are normal. Speech and behavior are normal.  ____________________________________________    LABS (pertinent positives/negatives)  Labs Reviewed  BASIC METABOLIC PANEL  CBC  TROPONIN I  LIPASE, BLOOD  HEPATIC FUNCTION PANEL     ____________________________________________   EKG  ED ECG REPORT I, Blondie Riggsbee W, the attending physician, personally viewed and interpreted this ECG.   Date: 01/30/2015  EKG Time: 3:28 AM  Rate: 80  Rhythm: Normal sinus rhythm  Axis: Normal  Intervals: QTC of 465  ST&T Change: None noted   ____________________________________________    RADIOLOGY  Chest x-ray IMPRESSION: Mild peribronchial thickening noted. Lungs otherwise clear.  ____________________________________________ ____________   INITIAL IMPRESSION / ASSESSMENT AND PLAN / ED COURSE  Pertinent labs & imaging results that were available during my care of the patient were reviewed by me and considered in my medical decision making (see chart for details).  Alert, overall well-appearing  59 year old female with notable tenderness and exquisite discomfort to her right chest on palpation. She also has some muscle spasm in the right thoracic back. She has a chest x-ray that shows some mild peribronchial thickening but no infiltrate. She has an EKG that does not show any ischemic changes. I believe the patient is doing well and has musculoskeletal pain in her chest. We have advised her to take ibuprofen, 600 mg, 3 times a day, as needed. I will provide  a prescription for this. She has been advised to follow-up with her primary doctor.  ____________________________________________   FINAL CLINICAL IMPRESSION(S) / ED DIAGNOSES  Final diagnoses:  Right-sided chest wall pain       Ahmed Prima, MD 01/30/15 [phone]

## 2015-01-30 NOTE — ED Notes (Signed)
Pt to triage via w/c with no distress noted; pt reports awoke with right sided CP; denies accomp symptoms, denies radiating pain; denies hx of same

## 2015-02-08 DIAGNOSIS — Z8673 Personal history of transient ischemic attack (TIA), and cerebral infarction without residual deficits: Secondary | ICD-10-CM | POA: Diagnosis not present

## 2015-02-08 DIAGNOSIS — E1165 Type 2 diabetes mellitus with hyperglycemia: Secondary | ICD-10-CM | POA: Diagnosis not present

## 2015-02-08 DIAGNOSIS — Z794 Long term (current) use of insulin: Secondary | ICD-10-CM | POA: Diagnosis not present

## 2015-02-08 DIAGNOSIS — I10 Essential (primary) hypertension: Secondary | ICD-10-CM | POA: Diagnosis not present

## 2015-02-08 DIAGNOSIS — F17210 Nicotine dependence, cigarettes, uncomplicated: Secondary | ICD-10-CM | POA: Diagnosis not present

## 2015-03-01 DIAGNOSIS — I10 Essential (primary) hypertension: Secondary | ICD-10-CM | POA: Diagnosis not present

## 2015-03-01 DIAGNOSIS — F17210 Nicotine dependence, cigarettes, uncomplicated: Secondary | ICD-10-CM | POA: Diagnosis not present

## 2015-03-01 DIAGNOSIS — Z8673 Personal history of transient ischemic attack (TIA), and cerebral infarction without residual deficits: Secondary | ICD-10-CM | POA: Diagnosis not present

## 2015-03-01 DIAGNOSIS — E1165 Type 2 diabetes mellitus with hyperglycemia: Secondary | ICD-10-CM | POA: Diagnosis not present

## 2015-03-01 DIAGNOSIS — Z794 Long term (current) use of insulin: Secondary | ICD-10-CM | POA: Diagnosis not present

## 2015-03-05 DIAGNOSIS — I10 Essential (primary) hypertension: Secondary | ICD-10-CM | POA: Diagnosis not present

## 2015-03-05 DIAGNOSIS — E1165 Type 2 diabetes mellitus with hyperglycemia: Secondary | ICD-10-CM | POA: Diagnosis not present

## 2015-03-05 DIAGNOSIS — Z794 Long term (current) use of insulin: Secondary | ICD-10-CM | POA: Diagnosis not present

## 2015-03-05 DIAGNOSIS — Z8673 Personal history of transient ischemic attack (TIA), and cerebral infarction without residual deficits: Secondary | ICD-10-CM | POA: Diagnosis not present

## 2015-03-05 DIAGNOSIS — F17210 Nicotine dependence, cigarettes, uncomplicated: Secondary | ICD-10-CM | POA: Diagnosis not present

## 2015-03-08 DIAGNOSIS — F17210 Nicotine dependence, cigarettes, uncomplicated: Secondary | ICD-10-CM | POA: Diagnosis not present

## 2015-03-08 DIAGNOSIS — Z8673 Personal history of transient ischemic attack (TIA), and cerebral infarction without residual deficits: Secondary | ICD-10-CM | POA: Diagnosis not present

## 2015-03-08 DIAGNOSIS — Z794 Long term (current) use of insulin: Secondary | ICD-10-CM | POA: Diagnosis not present

## 2015-03-08 DIAGNOSIS — I10 Essential (primary) hypertension: Secondary | ICD-10-CM | POA: Diagnosis not present

## 2015-03-08 DIAGNOSIS — E1165 Type 2 diabetes mellitus with hyperglycemia: Secondary | ICD-10-CM | POA: Diagnosis not present

## 2015-03-13 DIAGNOSIS — I10 Essential (primary) hypertension: Secondary | ICD-10-CM | POA: Diagnosis not present

## 2015-03-13 DIAGNOSIS — F17210 Nicotine dependence, cigarettes, uncomplicated: Secondary | ICD-10-CM | POA: Diagnosis not present

## 2015-03-13 DIAGNOSIS — J219 Acute bronchiolitis, unspecified: Secondary | ICD-10-CM | POA: Diagnosis not present

## 2015-03-13 DIAGNOSIS — E1165 Type 2 diabetes mellitus with hyperglycemia: Secondary | ICD-10-CM | POA: Diagnosis not present

## 2015-03-27 DIAGNOSIS — Z23 Encounter for immunization: Secondary | ICD-10-CM | POA: Diagnosis not present

## 2015-03-27 DIAGNOSIS — I10 Essential (primary) hypertension: Secondary | ICD-10-CM | POA: Diagnosis not present

## 2015-03-27 DIAGNOSIS — F17210 Nicotine dependence, cigarettes, uncomplicated: Secondary | ICD-10-CM | POA: Diagnosis not present

## 2015-03-27 DIAGNOSIS — E1165 Type 2 diabetes mellitus with hyperglycemia: Secondary | ICD-10-CM | POA: Diagnosis not present

## 2015-04-01 ENCOUNTER — Emergency Department
Admission: EM | Admit: 2015-04-01 | Discharge: 2015-04-01 | Disposition: A | Discharge status: Home / Self Care | Payer: Medicare Other | Attending: Emergency Medicine | Admitting: Emergency Medicine

## 2015-04-01 DIAGNOSIS — E119 Type 2 diabetes mellitus without complications: Secondary | ICD-10-CM | POA: Diagnosis not present

## 2015-04-01 DIAGNOSIS — R55 Syncope and collapse: Secondary | ICD-10-CM | POA: Insufficient documentation

## 2015-04-01 DIAGNOSIS — Z720 Tobacco use: Secondary | ICD-10-CM | POA: Diagnosis not present

## 2015-04-01 DIAGNOSIS — R569 Unspecified convulsions: Secondary | ICD-10-CM | POA: Diagnosis not present

## 2015-04-01 DIAGNOSIS — Z79899 Other long term (current) drug therapy: Secondary | ICD-10-CM | POA: Diagnosis not present

## 2015-04-01 DIAGNOSIS — R197 Diarrhea, unspecified: Secondary | ICD-10-CM | POA: Insufficient documentation

## 2015-04-01 DIAGNOSIS — I10 Essential (primary) hypertension: Secondary | ICD-10-CM | POA: Insufficient documentation

## 2015-04-01 LAB — URINALYSIS COMPLETE WITH MICROSCOPIC (ARMC ONLY)
BILIRUBIN URINE: NEGATIVE
Glucose, UA: 50 mg/dL — AB
HGB URINE DIPSTICK: NEGATIVE
Ketones, ur: NEGATIVE mg/dL
Leukocytes, UA: NEGATIVE
Nitrite: NEGATIVE
PH: 5 (ref 5.0–8.0)
PROTEIN: NEGATIVE mg/dL
Specific Gravity, Urine: 1.014 (ref 1.005–1.030)

## 2015-04-01 LAB — CBC WITH DIFFERENTIAL/PLATELET
BASOS ABS: 0.1 10*3/uL (ref 0–0.1)
Basophils Relative: 1 %
EOS ABS: 0.2 10*3/uL (ref 0–0.7)
Eosinophils Relative: 2 %
HCT: 36.3 % (ref 35.0–47.0)
HEMOGLOBIN: 11.9 g/dL — AB (ref 12.0–16.0)
LYMPHS ABS: 3.1 10*3/uL (ref 1.0–3.6)
Lymphocytes Relative: 32 %
MCH: 28.1 pg (ref 26.0–34.0)
MCHC: 32.7 g/dL (ref 32.0–36.0)
MCV: 85.9 fL (ref 80.0–100.0)
Monocytes Absolute: 0.8 10*3/uL (ref 0.2–0.9)
Monocytes Relative: 8 %
NEUTROS PCT: 57 %
Neutro Abs: 5.8 10*3/uL (ref 1.4–6.5)
PLATELETS: 273 10*3/uL (ref 150–440)
RBC: 4.23 MIL/uL (ref 3.80–5.20)
RDW: 14.1 % (ref 11.5–14.5)
WBC: 10 10*3/uL (ref 3.6–11.0)

## 2015-04-01 LAB — COMPREHENSIVE METABOLIC PANEL
ALBUMIN: 3.8 g/dL (ref 3.5–5.0)
ALK PHOS: 31 U/L — AB (ref 38–126)
ALT: 15 U/L (ref 14–54)
AST: 23 U/L (ref 15–41)
Anion gap: 14 (ref 5–15)
BUN: 20 mg/dL (ref 6–20)
CALCIUM: 9.6 mg/dL (ref 8.9–10.3)
CHLORIDE: 107 mmol/L (ref 101–111)
CO2: 19 mmol/L — AB (ref 22–32)
CREATININE: 1.3 mg/dL — AB (ref 0.44–1.00)
GFR calc non Af Amer: 44 mL/min — ABNORMAL LOW (ref >60)
GFR, EST AFRICAN AMERICAN: 51 mL/min — AB (ref >60)
GLUCOSE: 236 mg/dL — AB (ref 65–99)
Potassium: 3.5 mmol/L (ref 3.5–5.1)
SODIUM: 140 mmol/L (ref 135–145)
Total Bilirubin: 0.3 mg/dL (ref 0.3–1.2)
Total Protein: 7.5 g/dL (ref 6.5–8.1)

## 2015-04-01 LAB — MAGNESIUM: Magnesium: 2 mg/dL (ref 1.7–2.4)

## 2015-04-01 LAB — TROPONIN I: Troponin I: <0.03 ng/mL (ref <0.031)

## 2015-04-01 MED ORDER — SODIUM CHLORIDE 0.9 % IV BOLUS (SEPSIS)
1000.0000 mL | INTRAVENOUS | Status: AC
  Administered 2015-04-01: 1000 mL via INTRAVENOUS

## 2015-04-01 NOTE — ED Provider Notes (Signed)
[facility] Emergency Department Provider Note  ____________________________________________  Time seen: Approximately 4:26 PM  I have reviewed the triage vital signs and the nursing notes.   HISTORY  Chief Complaint Seizures    HPI Dawn Alvarez is a 59 y.o. female who presents by EMS after an episode of collapse and apparent loss of consciousness.  The EMS personnel reports that the patient and family believe that she had a seizure, and the patient told them that she has had similar seizure-like episodes in the past.  However the report that the patient gives today is that she was conducting her normal activities with some friends and then she passed out.  No seizure-like activity was reported, and she was "out" for an unknown period of time.  When EMS arrived she was not postictal and she has been completely alert and oriented the entire time in the emergency department.She denies any pain or discomfort.  She has had 2 episodes of loose stool since she has been in the emergency department but states that this has not been on ongoing problem for her.  She specifically denies fever/chills, chest pain, shortness of breath, abdominal pain, pelvic pain, dysuria.  Reports that she has had similar episodes in the past and has been evaluated in the emergency department at least twice in the last couple of months.   Past Medical History  Diagnosis Date  . Asthma   . Hypertension   . Diabetes mellitus without complication ([place])   . Hyperlipemia     There are no active problems to display for this patient.   Past Surgical History  Procedure Laterality Date  . Cesarean section      Current Outpatient Rx  Name  Route  Sig  Dispense  Refill  . bisoprolol-hydrochlorothiazide (ZIAC) 2.5-6.25 MG per tablet   Oral   Take 1 tablet by mouth daily.         . diazepam (VALIUM) 2 MG tablet   Oral   Take 1 tablet (2 mg total) by mouth every 8 (eight) hours as  needed for anxiety or muscle spasms.   20 tablet   0   . glyBURIDE-metformin (GLUCOVANCE) 2.5-500 MG per tablet   Oral   Take 2 tablets by mouth 2 (two) times daily.          . hydrALAZINE (APRESOLINE) 25 MG tablet   Oral   Take 25 mg by mouth 2 (two) times daily. Pt takes with a 50mg  tablet.         . hydrALAZINE (APRESOLINE) 50 MG tablet   Oral   Take 50 mg by mouth 2 (two) times daily. Pt takes with a 25mg  tablet.         Marland Kitchen ibuprofen (ADVIL,MOTRIN) 600 MG tablet   Oral   Take 1 tablet (600 mg total) by mouth every 8 (eight) hours as needed for moderate pain.   12 tablet   1   . levETIRAcetam (KEPPRA) 500 MG tablet   Oral   Take 1 tablet (500 mg total) by mouth 2 (two) times daily.   60 tablet   0   . lisinopril (PRINIVIL,ZESTRIL) 20 MG tablet   Oral   Take 20 mg by mouth daily.         . pravastatin (PRAVACHOL) 40 MG tablet   Oral   Take 40 mg by mouth at bedtime.         Marland Kitchen zolpidem (AMBIEN) 5 MG tablet   Oral  Take 5 mg by mouth at bedtime as needed for sleep.           Allergies Review of patient's allergies indicates no known allergies.  Family History  Problem Relation Age of Onset  . Diabetes Mother   . Hypertension Mother   . Hypertension Father   . Diabetes Father     Social History Social History  Substance Use Topics  . Smoking status: Current Every Day Smoker -- 1.00 packs/day for 2 years    Types: Cigarettes  . Smokeless tobacco: None  . Alcohol Use: 1.2 oz/week    2 Cans of beer per week    Review of Systems Constitutional: No fever/chills Eyes: No visual changes. ENT: No sore throat. Cardiovascular: Denies chest pain.  Reportedly passed out Respiratory: Denies shortness of breath. Gastrointestinal: No abdominal pain.  No nausea, no vomiting.  2 episodes of loose stools today.  No constipation. Genitourinary: Negative for dysuria. Musculoskeletal: Negative for back pain. Skin: Negative for rash. Neurological:  Negative for headaches, focal weakness or numbness.  10-point ROS otherwise negative.  ____________________________________________   PHYSICAL EXAM:  VITAL SIGNS: ED Triage Vitals  Enc Vitals Group     BP 04/01/15 1600 100/55 mmHg     Pulse Rate 04/01/15 1600 79     Resp 04/01/15 1600 20     Temp 04/01/15 1600 97.9 F (36.6 C)     Temp Source 04/01/15 1600 Oral     SpO2 04/01/15 1551 98 %     Weight 04/01/15 1600 165 lb (74.844 kg)     Height 04/01/15 1600 5\' 1"  (1.549 m)     Head Cir --      Peak Flow --      Pain Score 04/01/15 1601 0     Pain Loc --      Pain Edu? --      Excl. in [place]? --     Constitutional: Alert and oriented. Well appearing and in no acute distress. Eyes: Conjunctivae are normal. PERRL. EOMI. Head: Atraumatic. Nose: No congestion/rhinnorhea. Mouth/Throat: Mucous membranes are moist.  Oropharynx non-erythematous. Neck: No stridor.  No cervical spine tenderness to palpation. Cardiovascular: Normal rate, regular rhythm. Grossly normal heart sounds.  Good peripheral circulation. Respiratory: Normal respiratory effort.  No retractions. Lungs CTAB. Gastrointestinal: Soft and nontender. No distention. No abdominal bruits. No CVA tenderness. Musculoskeletal: No lower extremity tenderness nor edema.  No joint effusions. Neurologic:  Normal speech and language. No gross focal neurologic deficits are appreciated.  Skin:  Skin is warm, dry and intact. No rash noted. Psychiatric: Mood and affect are normal. Speech and behavior are normal.  ____________________________________________   LABS (all labs ordered are listed, but only abnormal results are displayed)  Labs Reviewed  CBC WITH DIFFERENTIAL/PLATELET - Abnormal; Notable for the following:    Hemoglobin 11.9 (*)    All other components within normal limits  COMPREHENSIVE METABOLIC PANEL - Abnormal; Notable for the following:    CO2 19 (*)    Glucose, Bld 236 (*)    Creatinine, Ser 1.30 (*)     Alkaline Phosphatase 31 (*)    GFR calc non Af Amer 44 (*)    GFR calc Af Amer 51 (*)    All other components within normal limits  URINALYSIS COMPLETEWITH MICROSCOPIC ([facility] ONLY) - Abnormal; Notable for the following:    Color, Urine YELLOW (*)    APPearance CLEAR (*)    Glucose, UA 50 (*)    Bacteria, UA RARE (*)  Squamous Epithelial / LPF 0-5 (*)    All other components within normal limits  MAGNESIUM  TROPONIN I  CBG MONITORING, ED   ____________________________________________  EKG  ED ECG REPORT I, Golda Zavalza, the attending physician, personally viewed and interpreted this ECG.  Date: 04/01/2015 EKG Time: 18:51 Rate: 84 Rhythm: normal sinus rhythm QRS Axis: normal Intervals: normal ST/T Wave abnormalities: Less than 1 mm of ST elevation in lead V2, nonspecific and not indicative of acute ischemia Conduction Disutrbances: none Narrative Interpretation: unremarkable  ____________________________________________  RADIOLOGY   No results found.  ____________________________________________   PROCEDURES  Procedure(s) performed: None  Critical Care performed: No ____________________________________________   INITIAL IMPRESSION / ASSESSMENT AND PLAN / ED COURSE  Pertinent labs & imaging results that were available during my care of the patient were reviewed by me and considered in my medical decision making (see chart for details).  The patient's history of present illness, signs, and symptoms suggest syncope, not seizure.  She is low-risk per the [facility] Syncope Rule.  Her vital signs are reassuring, she was not postictal, she currently has no pain.  She states that she has had similar episodes in the past.  Her blood sugar is not low.  I have observed her for several hours in the emergency department and she has an asymptomatic and she is asking if she can leave.  I believe there is no indication for further workup at this time.     ____________________________________________  FINAL CLINICAL IMPRESSION(S) / ED DIAGNOSES  Final diagnoses:  Syncope, unspecified syncope type      NEW MEDICATIONS STARTED DURING THIS VISIT:  New Prescriptions   No medications on file     Hinda Kehr, MD 04/01/15 1944

## 2015-04-01 NOTE — Discharge Instructions (Signed)
You have been seen today in the Emergency Department (ED)  for syncope (passing out).  Your workup including labs and EKG show reassuring results.  Your symptoms may be due to dehydration, so it is important that you drink plenty of non-alcoholic fluids. ° °Please call your regular doctor as soon as possible to schedule the next available clinic appointment to follow up with him/her regarding your visit to the ED and your symptoms.  Return to the Emergency Department (ED)  if you have any further syncopal episodes (pass out again) or develop ANY chest pain, pressure, tightness, trouble breathing, sudden sweating, or other symptoms that concern you. ° ° ° °Syncope °Syncope is a medical term for fainting or passing out. This means you lose consciousness and drop to the ground. People are generally unconscious for less than 5 minutes. You may have some muscle twitches for up to 15 seconds before waking up and returning to normal. Syncope occurs more often in older adults, but it can happen to anyone. While most causes of syncope are not dangerous, syncope can be a sign of a serious medical problem. It is important to seek medical care.  °CAUSES  °Syncope is caused by a sudden drop in blood flow to the brain. The specific cause is often not determined. Factors that can bring on syncope include: °· Taking medicines that lower blood pressure. °· Sudden changes in posture, such as standing up quickly. °· Taking more medicine than prescribed. °· Standing in one place for too long. °· Seizure disorders. °· Dehydration and excessive exposure to heat. °· Low blood sugar (hypoglycemia). °· Straining to have a bowel movement. °· Heart disease, irregular heartbeat, or other circulatory problems. °· Fear, emotional distress, seeing blood, or severe pain. °SYMPTOMS  °Right before fainting, you may: °· Feel dizzy or light-headed. °· Feel nauseous. °· See all white or all black in your field of vision. °· Have cold, clammy  skin. °DIAGNOSIS  °Your health care provider will ask about your symptoms, perform a physical exam, and perform an electrocardiogram (ECG) to record the electrical activity of your heart. Your health care provider may also perform other heart or blood tests to determine the cause of your syncope which may include: °· Transthoracic echocardiogram (TTE). During echocardiography, sound waves are used to evaluate how blood flows through your heart. °· Transesophageal echocardiogram (TEE). °· Cardiac monitoring. This allows your health care provider to monitor your heart rate and rhythm in real time. °· Holter monitor. This is a portable device that records your heartbeat and can help diagnose heart arrhythmias. It allows your health care provider to track your heart activity for several days, if needed. °· Stress tests by exercise or by giving medicine that makes the heart beat faster. °TREATMENT  °In most cases, no treatment is needed. Depending on the cause of your syncope, your health care provider may recommend changing or stopping some of your medicines. °HOME CARE INSTRUCTIONS °· Have someone stay with you until you feel stable. °· Do not drive, use machinery, or play sports until your health care provider says it is okay. °· Keep all follow-up appointments as directed by your health care provider. °· Lie down right away if you start feeling like you might faint. Breathe deeply and steadily. Wait until all the symptoms have passed. °· Drink enough fluids to keep your urine clear or pale yellow. °· If you are taking blood pressure or heart medicine, get up slowly and take several minutes to sit   and then stand. This can reduce dizziness. °SEEK IMMEDIATE MEDICAL CARE IF:  °· You have a severe headache. °· You have unusual pain in the chest, abdomen, or back. °· You are bleeding from your mouth or rectum, or you have black or tarry stool. °· You have an irregular or very fast heartbeat. °· You have pain with  breathing. °· You have repeated fainting or seizure-like jerking during an episode. °· You faint when sitting or lying down. °· You have confusion. °· You have trouble walking. °· You have severe weakness. °· You have vision problems. °If you fainted, call your local emergency services (911 in [place] Do not drive yourself to the hospital.  °  °This information is not intended to replace advice given to you by your health care provider. Make sure you discuss any questions you have with your health care provider. °  °Document Released: 05/19/2005 Document Revised: 10/03/2014 Document Reviewed: 07/18/2011 °Elsevier Interactive Patient Education ©2016 [employer] ° °

## 2015-04-01 NOTE — ED Notes (Signed)
Pt bib EMS w/ c/o seizure.  Per EMS, pt has hx of seizures.  Pt sts that she does not take medication for seizures.  Pt sts A/Ox4 and able to ambulate to BR.  Pt denies pain.

## 2015-04-01 NOTE — ED Notes (Signed)
Pt sts that she is unable to give urine sample at this time.  Informed pt of need for sample.  Pt sts that she will attempt.  Assisted pt to BR.  Pt did not give urine sample.

## 2015-04-10 DIAGNOSIS — Z1231 Encounter for screening mammogram for malignant neoplasm of breast: Secondary | ICD-10-CM | POA: Diagnosis not present

## 2015-04-24 DIAGNOSIS — G479 Sleep disorder, unspecified: Secondary | ICD-10-CM | POA: Diagnosis not present

## 2015-04-24 DIAGNOSIS — M79661 Pain in right lower leg: Secondary | ICD-10-CM | POA: Diagnosis not present

## 2015-04-24 DIAGNOSIS — F17210 Nicotine dependence, cigarettes, uncomplicated: Secondary | ICD-10-CM | POA: Diagnosis not present

## 2015-04-25 DIAGNOSIS — M79661 Pain in right lower leg: Secondary | ICD-10-CM | POA: Diagnosis not present

## 2015-07-09 DIAGNOSIS — F17210 Nicotine dependence, cigarettes, uncomplicated: Secondary | ICD-10-CM | POA: Diagnosis not present

## 2015-07-09 DIAGNOSIS — Z0001 Encounter for general adult medical examination with abnormal findings: Secondary | ICD-10-CM | POA: Diagnosis not present

## 2015-07-09 DIAGNOSIS — E1165 Type 2 diabetes mellitus with hyperglycemia: Secondary | ICD-10-CM | POA: Diagnosis not present

## 2015-07-09 DIAGNOSIS — E782 Mixed hyperlipidemia: Secondary | ICD-10-CM | POA: Diagnosis not present

## 2015-07-09 DIAGNOSIS — I10 Essential (primary) hypertension: Secondary | ICD-10-CM | POA: Diagnosis not present

## 2015-07-18 ENCOUNTER — Telehealth: Payer: Self-pay | Admitting: Gastroenterology

## 2015-07-18 NOTE — Telephone Encounter (Signed)
colonoscopy

## 2015-07-20 ENCOUNTER — Telehealth: Payer: Self-pay

## 2015-07-20 ENCOUNTER — Other Ambulatory Visit: Payer: Self-pay

## 2015-07-20 NOTE — Telephone Encounter (Signed)
Gastroenterology Pre-Procedure Review  Request Date: 08/06/15 Requesting Physician: Dr. Clayborn Bigness  PATIENT REVIEW QUESTIONS: The patient responded to the following health history questions as indicated:    1. Are you having any GI issues? no 2. Do you have a personal history of Polyps? no 3. Do you have a family history of Colon Cancer or Polyps? no 4. Diabetes Mellitus? yes (Type 2 ) 5. Joint replacements in the past 12 months?no 6. Major health problems in the past 3 months?no 7. Any artificial heart valves, MVP, or defibrillator?no    MEDICATIONS & ALLERGIES:    Patient reports the following regarding taking any anticoagulation/antiplatelet therapy:   Plavix, Coumadin, Eliquis, Xarelto, Lovenox, Pradaxa, Brilinta, or Effient? no Aspirin? no  Patient confirms/reports the following medications:  Current Outpatient Prescriptions  Medication Sig Dispense Refill  . bisoprolol-hydrochlorothiazide (ZIAC) 2.5-6.25 MG per tablet Take 1 tablet by mouth daily.    . diazepam (VALIUM) 2 MG tablet Take 1 tablet (2 mg total) by mouth every 8 (eight) hours as needed for anxiety or muscle spasms. 20 tablet 0  . glyBURIDE-metformin (GLUCOVANCE) 2.5-500 MG per tablet Take 2 tablets by mouth 2 (two) times daily.     . hydrALAZINE (APRESOLINE) 25 MG tablet Take 25 mg by mouth 2 (two) times daily. Pt takes with a 50mg  tablet.    . hydrALAZINE (APRESOLINE) 50 MG tablet Take 50 mg by mouth 2 (two) times daily. Pt takes with a 25mg  tablet.    Marland Kitchen ibuprofen (ADVIL,MOTRIN) 600 MG tablet Take 1 tablet (600 mg total) by mouth every 8 (eight) hours as needed for moderate pain. 12 tablet 1  . levETIRAcetam (KEPPRA) 500 MG tablet Take 1 tablet (500 mg total) by mouth 2 (two) times daily. 60 tablet 0  . lisinopril (PRINIVIL,ZESTRIL) 20 MG tablet Take 20 mg by mouth daily.    . pravastatin (PRAVACHOL) 40 MG tablet Take 40 mg by mouth at bedtime.    Marland Kitchen zolpidem (AMBIEN) 5 MG tablet Take 5 mg by mouth at bedtime as  needed for sleep.     No current facility-administered medications for this visit.    Patient confirms/reports the following allergies:  No Known Allergies  No orders of the defined types were placed in this encounter.    AUTHORIZATION INFORMATION Primary Insurance: 1D#: Group #:  Secondary Insurance: 1D#: Group #:  SCHEDULE INFORMATION: Date: 08/06/15 Time: Location: [place]

## 2015-07-20 NOTE — Telephone Encounter (Signed)
Pt scheduled for screening colonoscopy at [facility] on 08/06/15. Instructs/rx mailed.

## 2015-07-24 DIAGNOSIS — F17210 Nicotine dependence, cigarettes, uncomplicated: Secondary | ICD-10-CM | POA: Diagnosis not present

## 2015-07-24 DIAGNOSIS — R0683 Snoring: Secondary | ICD-10-CM | POA: Diagnosis not present

## 2015-07-24 DIAGNOSIS — J309 Allergic rhinitis, unspecified: Secondary | ICD-10-CM | POA: Diagnosis not present

## 2015-07-25 ENCOUNTER — Other Ambulatory Visit: Payer: Self-pay

## 2015-07-31 ENCOUNTER — Other Ambulatory Visit: Payer: Self-pay

## 2015-07-31 DIAGNOSIS — Z1211 Encounter for screening for malignant neoplasm of colon: Secondary | ICD-10-CM

## 2015-07-31 MED ORDER — PEG 3350-KCL-NA BICARB-NACL 420 G PO SOLR: 4000.0000 mL | ORAL | Status: DC

## 2015-08-06 ENCOUNTER — Encounter: Admission: RE | Payer: Self-pay | Source: Ambulatory Visit

## 2015-08-06 ENCOUNTER — Ambulatory Visit: Admission: RE | Admit: 2015-08-06 | Payer: Medicare Other | Source: Ambulatory Visit | Admitting: Gastroenterology

## 2015-08-06 SURGERY — COLONOSCOPY WITH PROPOFOL
Anesthesia: Choice

## 2015-09-10 ENCOUNTER — Encounter: Payer: Self-pay | Admitting: *Deleted

## 2015-09-11 ENCOUNTER — Ambulatory Visit: Payer: Medicare Other | Admitting: Registered Nurse

## 2015-09-11 ENCOUNTER — Encounter: Payer: Self-pay | Admitting: *Deleted

## 2015-09-11 ENCOUNTER — Encounter
Admission: RE | Disposition: A | Discharge status: Home / Self Care | Payer: Self-pay | Source: Ambulatory Visit | Attending: Gastroenterology

## 2015-09-11 ENCOUNTER — Ambulatory Visit
Admission: RE | Admit: 2015-09-11 | Discharge: 2015-09-11 | Disposition: A | Discharge status: Home / Self Care | Payer: Medicare Other | Source: Ambulatory Visit | Attending: Gastroenterology | Admitting: Gastroenterology

## 2015-09-11 DIAGNOSIS — Z79899 Other long term (current) drug therapy: Secondary | ICD-10-CM | POA: Diagnosis not present

## 2015-09-11 DIAGNOSIS — K648 Other hemorrhoids: Secondary | ICD-10-CM | POA: Insufficient documentation

## 2015-09-11 DIAGNOSIS — I10 Essential (primary) hypertension: Secondary | ICD-10-CM | POA: Diagnosis not present

## 2015-09-11 DIAGNOSIS — I2510 Atherosclerotic heart disease of native coronary artery without angina pectoris: Secondary | ICD-10-CM | POA: Insufficient documentation

## 2015-09-11 DIAGNOSIS — E119 Type 2 diabetes mellitus without complications: Secondary | ICD-10-CM | POA: Diagnosis not present

## 2015-09-11 DIAGNOSIS — E785 Hyperlipidemia, unspecified: Secondary | ICD-10-CM | POA: Insufficient documentation

## 2015-09-11 DIAGNOSIS — K635 Polyp of colon: Secondary | ICD-10-CM | POA: Diagnosis not present

## 2015-09-11 DIAGNOSIS — Z7984 Long term (current) use of oral hypoglycemic drugs: Secondary | ICD-10-CM | POA: Diagnosis not present

## 2015-09-11 DIAGNOSIS — D125 Benign neoplasm of sigmoid colon: Secondary | ICD-10-CM | POA: Diagnosis not present

## 2015-09-11 DIAGNOSIS — Z1211 Encounter for screening for malignant neoplasm of colon: Secondary | ICD-10-CM | POA: Diagnosis not present

## 2015-09-11 DIAGNOSIS — J45909 Unspecified asthma, uncomplicated: Secondary | ICD-10-CM | POA: Diagnosis not present

## 2015-09-11 DIAGNOSIS — F17200 Nicotine dependence, unspecified, uncomplicated: Secondary | ICD-10-CM | POA: Insufficient documentation

## 2015-09-11 DIAGNOSIS — Z0001 Encounter for general adult medical examination with abnormal findings: Secondary | ICD-10-CM | POA: Insufficient documentation

## 2015-09-11 HISTORY — PX: COLONOSCOPY WITH PROPOFOL: SHX5780

## 2015-09-11 LAB — GLUCOSE, CAPILLARY: GLUCOSE-CAPILLARY: 209 mg/dL — AB (ref 65–99)

## 2015-09-11 SURGERY — COLONOSCOPY WITH PROPOFOL
Anesthesia: General

## 2015-09-11 MED ORDER — PROPOFOL 500 MG/50ML IV EMUL: INTRAVENOUS | Status: DC | PRN

## 2015-09-11 MED ORDER — SODIUM CHLORIDE 0.9 % IV SOLN
INTRAVENOUS | Status: DC
  Administered 2015-09-11: 10:00:00 via INTRAVENOUS

## 2015-09-11 MED ORDER — PROPOFOL 500 MG/50ML IV EMUL
INTRAVENOUS | Status: DC | PRN
  Administered 2015-09-11: 75 ug/kg/min via INTRAVENOUS

## 2015-09-11 MED ORDER — LACTATED RINGERS IV SOLN
INTRAVENOUS | Status: DC | PRN
  Administered 2015-09-11: 10:00:00 via INTRAVENOUS

## 2015-09-11 MED ORDER — FENTANYL CITRATE (PF) 100 MCG/2ML IJ SOLN
INTRAMUSCULAR | Status: DC | PRN
  Administered 2015-09-11: 50 ug via INTRAVENOUS

## 2015-09-11 MED ORDER — MIDAZOLAM HCL 2 MG/2ML IJ SOLN: INTRAMUSCULAR | Status: DC | PRN

## 2015-09-11 MED ORDER — MIDAZOLAM HCL 2 MG/2ML IJ SOLN
INTRAMUSCULAR | Status: DC | PRN
  Administered 2015-09-11: 1 mg via INTRAVENOUS

## 2015-09-11 MED ORDER — FENTANYL CITRATE (PF) 100 MCG/2ML IJ SOLN
INTRAMUSCULAR | Status: DC | PRN
Start: 2015-09-11 — End: 2015-09-11

## 2015-09-11 NOTE — Anesthesia Preprocedure Evaluation (Signed)
Anesthesia Evaluation  Patient identified by MRN, date of birth, ID band Patient awake    Reviewed: Allergy & Precautions, H&P , NPO status , Patient's Chart, lab work & pertinent test results, reviewed documented beta blocker date and time   History of Anesthesia Complications Negative for: history of anesthetic complications  Airway Mallampati: III  TM Distance: >3 FB Neck ROM: full    Dental no notable dental hx. (+) Edentulous Upper, Edentulous Lower, Upper Dentures, Lower Dentures   Pulmonary neg shortness of breath, asthma , neg sleep apnea, neg COPD, neg recent URI, Current Smoker,    Pulmonary exam normal breath sounds clear to auscultation       Cardiovascular Exercise Tolerance: Good hypertension, (-) angina(-) CAD, (-) Past MI, (-) Cardiac Stents and (-) CABG Normal cardiovascular exam(-) dysrhythmias (-) Valvular Problems/Murmurs Rhythm:regular Rate:Normal     Neuro/Psych Seizures - (does not take medicine),  negative psych ROS   GI/Hepatic negative GI ROS, Neg liver ROS,   Endo/Other  diabetes, Oral Hypoglycemic Agents  Renal/GU negative Renal ROS  negative genitourinary   Musculoskeletal   Abdominal   Peds  Hematology negative hematology ROS (+)   Anesthesia Other Findings Past Medical History:   Asthma                                                       Hypertension                                                 Diabetes mellitus without complication (HCC)                 Hyperlipemia                                                 Reproductive/Obstetrics negative OB ROS                             Anesthesia Physical Anesthesia Plan  ASA: III  Anesthesia Plan: General   Post-op Pain Management:    Induction:   Airway Management Planned:   Additional Equipment:   Intra-op Plan:   Post-operative Plan:   Informed Consent: I have reviewed the patients History  and Physical, chart, labs and discussed the procedure including the risks, benefits and alternatives for the proposed anesthesia with the patient or authorized representative who has indicated his/her understanding and acceptance.   Dental Advisory Given  Plan Discussed with: Anesthesiologist, CRNA and Surgeon  Anesthesia Plan Comments:         Anesthesia Quick Evaluation

## 2015-09-11 NOTE — Anesthesia Postprocedure Evaluation (Signed)
Anesthesia Post Note  Patient: Dawn Alvarez  Procedure(s) Performed: Procedure(s) (LRB): COLONOSCOPY WITH PROPOFOL (N/A)  Patient location during evaluation: Endoscopy Anesthesia Type: General Level of consciousness: awake and alert Pain management: pain level controlled Vital Signs Assessment: post-procedure vital signs reviewed and stable Respiratory status: spontaneous breathing, nonlabored ventilation, respiratory function stable and patient connected to nasal cannula oxygen Cardiovascular status: blood pressure returned to baseline and stable Postop Assessment: no signs of nausea or vomiting Anesthetic complications: no    Last Vitals:  Filed Vitals:   09/11/15 1032 09/11/15 1100  BP: 131/78 177/86  Pulse: 85   Temp: 36.2 C   Resp: 16     Last Pain: There were no vitals filed for this visit.               Martha Clan

## 2015-09-11 NOTE — Transfer of Care (Signed)
Immediate Anesthesia Transfer of Care Note  Patient: Dawn Alvarez  Procedure(s) Performed: Procedure(s): COLONOSCOPY WITH PROPOFOL (N/A)  Patient Location: PACU  Anesthesia Type:General  Level of Consciousness: awake and alert   Airway & Oxygen Therapy: Patient Spontanous Breathing  Post-op Assessment: Report given to RN  Post vital signs: Reviewed and stable  Last Vitals:  Filed Vitals:   09/11/15 0936 09/11/15 1032  BP: 181/94 137/67  Pulse: 70 74  Temp: 35.7 C 36.8 C  Resp: 16 16    Complications: No apparent anesthesia complications

## 2015-09-11 NOTE — Op Note (Signed)
[facility]enter[facility] Patient Name: Dawn Alvarez Procedure Date: 09/11/2015 10:11 AM MRN: [id number] Account #: [id number] Date of Birth: Sep 21, 1955 Admit Type: Outpatient Age: 60 Room: [place] [facility] ROOM 4 Gender: Female Note Status: Finalized Procedure:            Colonoscopy Indications:          Screening for colorectal malignant neoplasm Providers:            Lucilla Lame, MD Referring MD:         Lavera Guise, MD (Referring MD) Medicines:            Propofol per Anesthesia Complications:        No immediate complications. Procedure:            Pre-Anesthesia Assessment:                       - Prior to the procedure, a History and Physical was                        performed, and patient medications and allergies were                        reviewed. The patient's tolerance of previous                        anesthesia was also reviewed. The risks and benefits of                        the procedure and the sedation options and risks were                        discussed with the patient. All questions were                        answered, and informed consent was obtained. Prior                        Anticoagulants: The patient has taken no previous                        anticoagulant or antiplatelet agents. ASA Grade                        Assessment: II - A patient with mild systemic disease.                        After reviewing the risks and benefits, the patient was                        deemed in satisfactory condition to undergo the                        procedure.                       After obtaining informed consent, the colonoscope was                        passed under direct vision. Throughout the procedure,  the patient's blood pressure, pulse, and oxygen                        saturations were monitored continuously. The                        Colonoscope was introduced through the anus and        advanced to the the cecum, identified by appendiceal                        orifice and ileocecal valve. The colonoscopy was                        performed without difficulty. The patient tolerated the                        procedure well. The quality of the bowel preparation                        was excellent. The entire colon was examined. Findings:      The perianal and digital rectal examinations were normal.      A 5 mm polyp was found in the sigmoid colon. The polyp was sessile. The       polyp was removed with a cold snare. Resection and retrieval were       complete.      Non-bleeding internal hemorrhoids were found during retroflexion. The       hemorrhoids were Grade II (internal hemorrhoids that prolapse but reduce       spontaneously). Impression:           - One 5 mm polyp in the sigmoid colon, removed with a                        cold snare. Resected and retrieved.                       - Non-bleeding internal hemorrhoids. Recommendation:       - Repeat colonoscopy in 5 years if polyp adenoma and 10                        years if hyperplastic Procedure Code(s):    --- Professional ---                       [phone], Colonoscopy, flexible; with removal of tumor(s),                        polyp(s), or other lesion(s) by snare technique Diagnosis Code(s):    --- Professional ---                       Z12.11, Encounter for screening for malignant neoplasm                        of colon                       D12.5, Benign neoplasm of sigmoid colon CPT copyright 2016 American Medical Association. All rights reserved. The codes documented in this report are preliminary and upon coder review may  be revised to meet  current compliance requirements. Lucilla Lame, MD 09/11/2015 10:30:22 AM This report has been signed electronically. Number of Addenda: 0 Note Initiated On: 09/11/2015 10:11 AM Scope Withdrawal Time: 0 hours 8 minutes 30 seconds  Total Procedure Duration: 0  hours 13 minutes 8 seconds       [facility]

## 2015-09-11 NOTE — H&P (Signed)
[facility]  [address] [place], [place] [address] Phone: [phone] Fax : [phone]  Primary Care Physician:  Lavera Guise, MD Primary Gastroenterologist:  Dr. Allen Norris  Pre-Procedure History & Physical: HPI:  Dawn Alvarez is a 60 y.o. female is here for a screening colonoscopy.   Past Medical History  Diagnosis Date  . Asthma   . Hypertension   . Diabetes mellitus without complication ([place])   . Hyperlipemia     Past Surgical History  Procedure Laterality Date  . Cesarean section      Prior to Admission medications   Medication Sig Start Date End Date Taking? Authorizing Provider  bisoprolol-hydrochlorothiazide (ZIAC) 2.5-6.25 MG per tablet Take 1 tablet by mouth daily.    Historical Provider, MD  diazepam (VALIUM) 2 MG tablet Take 1 tablet (2 mg total) by mouth every 8 (eight) hours as needed for anxiety or muscle spasms. 01/14/15   Ahmed Prima, MD  glyBURIDE-metformin (GLUCOVANCE) 2.5-500 MG per tablet Take 2 tablets by mouth 2 (two) times daily.     Historical Provider, MD  hydrALAZINE (APRESOLINE) 25 MG tablet Take 25 mg by mouth 2 (two) times daily. Pt takes with a 50mg  tablet.    Historical Provider, MD  hydrALAZINE (APRESOLINE) 50 MG tablet Take 50 mg by mouth 2 (two) times daily. Pt takes with a 25mg  tablet.    Historical Provider, MD  ibuprofen (ADVIL,MOTRIN) 600 MG tablet Take 1 tablet (600 mg total) by mouth every 8 (eight) hours as needed for moderate pain. 01/30/15   Ahmed Prima, MD  levETIRAcetam (KEPPRA) 500 MG tablet Take 1 tablet (500 mg total) by mouth 2 (two) times daily. Patient not taking: Reported on 07/20/2015 01/05/15   Lisa Roca, MD  lisinopril (PRINIVIL,ZESTRIL) 20 MG tablet Take 20 mg by mouth daily.    Historical Provider, MD  polyethylene glycol-electrolytes (TRILYTE) 420 g solution Take 4,000 mLs by mouth as directed. Drink one 8oz glass every 30 mins until stools are clear 07/31/15   Lucilla Lame, MD    pravastatin (PRAVACHOL) 40 MG tablet Take 40 mg by mouth at bedtime.    Historical Provider, MD  zolpidem (AMBIEN) 5 MG tablet Take 5 mg by mouth at bedtime as needed for sleep.    Historical Provider, MD    Allergies as of 07/25/2015  . (No Known Allergies)    Family History  Problem Relation Age of Onset  . Diabetes Mother   . Hypertension Mother   . Hypertension Father   . Diabetes Father     Social History   Social History  . Marital Status: Married    Spouse Name: N/A  . Number of Children: N/A  . Years of Education: N/A   Occupational History  . Not on file.   Social History Main Topics  . Smoking status: Current Every Day Smoker -- 1.00 packs/day for 2 years    Types: Cigarettes  . Smokeless tobacco: Not on file  . Alcohol Use: 1.2 oz/week    2 Cans of beer per week  . Drug Use: No  . Sexual Activity: No   Other Topics Concern  . Not on file   Social History Narrative    Review of Systems: See HPI, otherwise negative ROS  Physical Exam: BP 181/94 mmHg  Pulse 70  Temp(Src) 96.2 F (35.7 C) (Tympanic)  Resp 16  Ht 5\' 1"  (1.549 m)  Wt 165 lb (74.844 kg)  BMI 31.19 kg/m2  SpO2 100%  General:   Alert,  pleasant and cooperative in NAD Head:  Normocephalic and atraumatic. Neck:  Supple; no masses or thyromegaly. Lungs:  Clear throughout to auscultation.    Heart:  Regular rate and rhythm. Abdomen:  Soft, nontender and nondistended. Normal bowel sounds, without guarding, and without rebound.   Neurologic:  Alert and  oriented x4;  grossly normal neurologically.  Impression/Plan: Dawn Alvarez is now here to undergo a screening colonoscopy.  Risks, benefits, and alternatives regarding colonoscopy have been reviewed with the patient.  Questions have been answered.  All parties agreeable.

## 2015-09-12 ENCOUNTER — Encounter: Payer: Self-pay | Admitting: Gastroenterology

## 2015-09-12 LAB — SURGICAL PATHOLOGY

## 2015-09-13 ENCOUNTER — Encounter: Payer: Self-pay | Admitting: Gastroenterology

## 2015-10-22 DIAGNOSIS — Z0001 Encounter for general adult medical examination with abnormal findings: Secondary | ICD-10-CM | POA: Diagnosis not present

## 2015-10-22 DIAGNOSIS — F17210 Nicotine dependence, cigarettes, uncomplicated: Secondary | ICD-10-CM | POA: Diagnosis not present

## 2015-10-22 DIAGNOSIS — I10 Essential (primary) hypertension: Secondary | ICD-10-CM | POA: Diagnosis not present

## 2015-10-22 DIAGNOSIS — S0001XA Abrasion of scalp, initial encounter: Secondary | ICD-10-CM | POA: Diagnosis not present

## 2015-10-22 DIAGNOSIS — E1165 Type 2 diabetes mellitus with hyperglycemia: Secondary | ICD-10-CM | POA: Diagnosis not present

## 2015-10-22 DIAGNOSIS — E782 Mixed hyperlipidemia: Secondary | ICD-10-CM | POA: Diagnosis not present

## 2015-11-09 ENCOUNTER — Emergency Department
Admission: EM | Admit: 2015-11-09 | Discharge: 2015-11-09 | Disposition: A | Discharge status: Home / Self Care | Payer: Medicare Other | Attending: Emergency Medicine | Admitting: Emergency Medicine

## 2015-11-09 ENCOUNTER — Encounter: Payer: Self-pay | Admitting: Emergency Medicine

## 2015-11-09 DIAGNOSIS — J45909 Unspecified asthma, uncomplicated: Secondary | ICD-10-CM | POA: Diagnosis not present

## 2015-11-09 DIAGNOSIS — Z79899 Other long term (current) drug therapy: Secondary | ICD-10-CM | POA: Insufficient documentation

## 2015-11-09 DIAGNOSIS — IMO0002 Reserved for concepts with insufficient information to code with codable children: Secondary | ICD-10-CM

## 2015-11-09 DIAGNOSIS — Y999 Unspecified external cause status: Secondary | ICD-10-CM | POA: Insufficient documentation

## 2015-11-09 DIAGNOSIS — F17210 Nicotine dependence, cigarettes, uncomplicated: Secondary | ICD-10-CM | POA: Diagnosis not present

## 2015-11-09 DIAGNOSIS — I10 Essential (primary) hypertension: Secondary | ICD-10-CM | POA: Insufficient documentation

## 2015-11-09 DIAGNOSIS — E785 Hyperlipidemia, unspecified: Secondary | ICD-10-CM | POA: Diagnosis not present

## 2015-11-09 DIAGNOSIS — Y929 Unspecified place or not applicable: Secondary | ICD-10-CM | POA: Insufficient documentation

## 2015-11-09 DIAGNOSIS — T814XXA Infection following a procedure, initial encounter: Secondary | ICD-10-CM | POA: Diagnosis not present

## 2015-11-09 DIAGNOSIS — E1165 Type 2 diabetes mellitus with hyperglycemia: Secondary | ICD-10-CM | POA: Diagnosis not present

## 2015-11-09 DIAGNOSIS — L98499 Non-pressure chronic ulcer of skin of other sites with unspecified severity: Secondary | ICD-10-CM | POA: Diagnosis not present

## 2015-11-09 DIAGNOSIS — L089 Local infection of the skin and subcutaneous tissue, unspecified: Secondary | ICD-10-CM | POA: Diagnosis present

## 2015-11-09 DIAGNOSIS — E11622 Type 2 diabetes mellitus with other skin ulcer: Secondary | ICD-10-CM | POA: Insufficient documentation

## 2015-11-09 DIAGNOSIS — T798XXA Other early complications of trauma, initial encounter: Secondary | ICD-10-CM

## 2015-11-09 DIAGNOSIS — X58XXXA Exposure to other specified factors, initial encounter: Secondary | ICD-10-CM | POA: Diagnosis not present

## 2015-11-09 DIAGNOSIS — Y9389 Activity, other specified: Secondary | ICD-10-CM | POA: Insufficient documentation

## 2015-11-09 DIAGNOSIS — S0100XA Unspecified open wound of scalp, initial encounter: Secondary | ICD-10-CM | POA: Diagnosis not present

## 2015-11-09 LAB — BASIC METABOLIC PANEL
Anion gap: 10 (ref 5–15)
BUN: 23 mg/dL — AB (ref 6–20)
CALCIUM: 9.7 mg/dL (ref 8.9–10.3)
CO2: 22 mmol/L (ref 22–32)
CREATININE: 1.14 mg/dL — AB (ref 0.44–1.00)
Chloride: 105 mmol/L (ref 101–111)
GFR calc Af Amer: 60 mL/min — ABNORMAL LOW (ref >60)
GFR calc non Af Amer: 52 mL/min — ABNORMAL LOW (ref >60)
GLUCOSE: 201 mg/dL — AB (ref 65–99)
POTASSIUM: 3.7 mmol/L (ref 3.5–5.1)
Sodium: 137 mmol/L (ref 135–145)

## 2015-11-09 LAB — CBC WITH DIFFERENTIAL/PLATELET
Basophils Absolute: 0.1 10*3/uL (ref 0–0.1)
Basophils Relative: 1 %
EOS ABS: 0.3 10*3/uL (ref 0–0.7)
EOS PCT: 3 %
HCT: 38.3 % (ref 35.0–47.0)
Hemoglobin: 13.1 g/dL (ref 12.0–16.0)
LYMPHS ABS: 2.7 10*3/uL (ref 1.0–3.6)
Lymphocytes Relative: 30 %
MCH: 28.7 pg (ref 26.0–34.0)
MCHC: 34.3 g/dL (ref 32.0–36.0)
MCV: 83.8 fL (ref 80.0–100.0)
Monocytes Absolute: 0.5 10*3/uL (ref 0.2–0.9)
Monocytes Relative: 6 %
Neutro Abs: 5.5 10*3/uL (ref 1.4–6.5)
Neutrophils Relative %: 60 %
PLATELETS: 250 10*3/uL (ref 150–440)
RBC: 4.57 MIL/uL (ref 3.80–5.20)
RDW: 13.5 % (ref 11.5–14.5)
WBC: 9 10*3/uL (ref 3.6–11.0)

## 2015-11-09 MED ORDER — CEPHALEXIN 500 MG PO CAPS: 500.0000 mg | ORAL_CAPSULE | Freq: Three times a day (TID) | ORAL | Status: DC

## 2015-11-09 MED ORDER — MUPIROCIN 2 % EX OINT: TOPICAL_OINTMENT | CUTANEOUS | Status: DC

## 2015-11-09 NOTE — Discharge Instructions (Signed)
Begin your antibiotic and antibiotic ointment to your wound area. You will need to pay close attention to your diabetic diet and blood sugar control. You need to follow-up with your primary care doctor next week about your blood sugar and also about your kidney function tests. Keflex by mouth for infection by mouth and Bactroban ointment

## 2015-11-09 NOTE — ED Notes (Addendum)
Patient states around mother's day had a pony tail in her head and ripped it out, tearing part of her scalp.  Pt states this morning she poured H2O2 on it and noticed it was bleeding. Denies pain and states she did not hit her head.  Bleeding controlled at this time. Area has some purulent drainage to it.

## 2015-11-09 NOTE — ED Notes (Addendum)
Pt to ed with c/o open wound to top of head x 1 month.  Denies injury. Pt states it spontaneously appeared and has gotten worse.  Area is approximately 1-2 inches in diameter.  No drainage noted at this time.

## 2015-11-09 NOTE — ED Provider Notes (Signed)
[facility] Emergency Department Provider Note  ____________________________________________  Time seen: Approximately 9:26 AM  I have reviewed the triage vital signs and the nursing notes.   HISTORY  Chief Complaint Wound Infection   HPI Dawn Alvarez is a 60 y.o. female patient in today with complaint of a wound to her scalp that happened approximately 3 weeks ago. Patient states she was wearing a "ponytail" that was ripped out tearing part of her scalp. Patient states that she still continues to have an area that does not appear to be healing. Patient states that she cleaned this area by "pouring peroxide on it". This morning she did so and saw a little bit of blood. She denies any fever or chills. There's been no nausea or vomiting. Patient is diabetic and states that normally her blood sugars run around 107. Patient has not checked her fasting blood sugar today. Patient is also not seen her primary care doctor for this area on her scalp.Family member states that patient was seen last month by her PCP and had lab work done that they believe was normal. She rates her pain is 9/10.    Past Medical History  Diagnosis Date  . Asthma   . Hypertension   . Diabetes mellitus without complication ([place])   . Hyperlipemia     Patient Active Problem List   Diagnosis Date Noted  . Special screening for malignant neoplasms, colon   . Benign neoplasm of sigmoid colon     Past Surgical History  Procedure Laterality Date  . Cesarean section    . Colonoscopy with propofol N/A 09/11/2015    Procedure: COLONOSCOPY WITH PROPOFOL;  Surgeon: Lucilla Lame, MD;  Location: [facility] ENDOSCOPY;  Service: Endoscopy;  Laterality: N/A;    Current Outpatient Rx  Name  Route  Sig  Dispense  Refill  . bisoprolol-hydrochlorothiazide (ZIAC) 2.5-6.25 MG per tablet   Oral   Take 1 tablet by mouth daily.         . cephALEXin (KEFLEX) 500 MG capsule   Oral   Take 1 capsule (500  mg total) by mouth 3 (three) times daily.   30 capsule   0   . diazepam (VALIUM) 2 MG tablet   Oral   Take 1 tablet (2 mg total) by mouth every 8 (eight) hours as needed for anxiety or muscle spasms.   20 tablet   0   . glyBURIDE-metformin (GLUCOVANCE) 2.5-500 MG per tablet   Oral   Take 2 tablets by mouth 2 (two) times daily.          . hydrALAZINE (APRESOLINE) 25 MG tablet   Oral   Take 25 mg by mouth 2 (two) times daily. Pt takes with a 50mg  tablet.         . hydrALAZINE (APRESOLINE) 50 MG tablet   Oral   Take 50 mg by mouth 2 (two) times daily. Pt takes with a 25mg  tablet.         Marland Kitchen ibuprofen (ADVIL,MOTRIN) 600 MG tablet   Oral   Take 1 tablet (600 mg total) by mouth every 8 (eight) hours as needed for moderate pain.   12 tablet   1   . levETIRAcetam (KEPPRA) 500 MG tablet   Oral   Take 1 tablet (500 mg total) by mouth 2 (two) times daily. Patient not taking: Reported on 07/20/2015   60 tablet   0   . lisinopril (PRINIVIL,ZESTRIL) 20 MG tablet   Oral  Take 20 mg by mouth daily.         . mupirocin ointment (BACTROBAN) 2 %      Apply to affected area 3 times daily   22 g   0   . polyethylene glycol-electrolytes (TRILYTE) 420 g solution   Oral   Take 4,000 mLs by mouth as directed. Drink one 8oz glass every 30 mins until stools are clear   4000 mL   0   . pravastatin (PRAVACHOL) 40 MG tablet   Oral   Take 40 mg by mouth at bedtime.         Marland Kitchen zolpidem (AMBIEN) 5 MG tablet   Oral   Take 5 mg by mouth at bedtime as needed for sleep.           Allergies Review of patient's allergies indicates no known allergies.  Family History  Problem Relation Age of Onset  . Diabetes Mother   . Hypertension Mother   . Hypertension Father   . Diabetes Father     Social History Social History  Substance Use Topics  . Smoking status: Current Every Day Smoker -- 1.00 packs/day for 2 years    Types: Cigarettes  . Smokeless tobacco: None  . Alcohol  Use: 1.2 oz/week    2 Cans of beer per week    Review of Systems Constitutional: No fever/chills Eyes: No visual changes. Cardiovascular: Denies chest pain. Respiratory: Denies shortness of breath. Gastrointestinal:   No nausea, no vomiting.  Musculoskeletal: Negative for back pain. Skin: Positive nonhealing wound to scalp. Neurological: Negative for headaches, focal weakness or numbness.  10-point ROS otherwise negative.  ____________________________________________   PHYSICAL EXAM:  VITAL SIGNS: ED Triage Vitals  Enc Vitals Group     BP 11/09/15 0917 156/73 mmHg     Pulse Rate 11/09/15 0917 77     Resp 11/09/15 0917 18     Temp 11/09/15 0917 98.2 F (36.8 C)     Temp Source 11/09/15 0917 Oral     SpO2 11/09/15 0917 95 %     Weight 11/09/15 0917 172 lb (78.019 kg)     Height 11/09/15 0917 5\' 4"  (1.626 m)     Head Cir --      Peak Flow --      Pain Score 11/09/15 0915 9     Pain Loc --      Pain Edu? --      Excl. in [place]? --     Constitutional: Alert and oriented. Well appearing and in no acute distress. Eyes: Conjunctivae are normal. PERRL. EOMI. Head: Atraumatic. Nose: No congestion/rhinnorhea. Neck: No stridor.   Hematological/Lymphatic/Immunilogical: No cervical lymphadenopathy. Cardiovascular: Normal rate, regular rhythm. Grossly normal heart sounds.  Good peripheral circulation. Respiratory: Normal respiratory effort.  No retractions. Lungs CTAB. Musculoskeletal: Moves upper and lower extremities that he difficulty. Normal gait was noted. Neurologic:  Normal speech and language. No gross focal neurologic deficits are appreciated. No gait instability. Skin:  Skin is warm, dry.  There is approximately a 2.5 cm in diameter shallow skin avulsion that appears to be healing slowly. There is some minimal tenderness but no drainage is noted on palpation of the area. There is no warmth appreciated. Psychiatric: Mood and affect are normal. Speech and behavior are  normal.  ____________________________________________   LABS (all labs ordered are listed, but only abnormal results are displayed)  Labs Reviewed  BASIC METABOLIC PANEL - Abnormal; Notable for the following:    Glucose, Bld 201 (*)  BUN 23 (*)    Creatinine, Ser 1.14 (*)    GFR calc non Af Amer 52 (*)    GFR calc Af Amer 60 (*)    All other components within normal limits  CBC WITH DIFFERENTIAL/PLATELET    PROCEDURES  Procedure(s) performed: None  Critical Care performed: No  ____________________________________________   INITIAL IMPRESSION / ASSESSMENT AND PLAN / ED COURSE  Pertinent labs & imaging results that were available during my care of the patient were reviewed by me and considered in my medical decision making (see chart for details).  In talking with patient she is to discontinue taking for strength peroxide to the wound. She is encouraged to use mild soap and water. Patient was advised that her blood sugar is much higher than what she initially said that it was. She also has no knowledge of her kidney functions in the past but family members state that she has blood work done routinely at her PCP. Patient was encouraged to adhere to her diabetic diet and continue her diabetes medication as prescribed. She is also given a prescription for Keflex 500 mg 3 times a day for 10 days along with some Bactroban ointment to apply to the area. She is to call today to make an appointment with her PCP for next week. At which time family members are aware that her kidney function blood test needs to be discussed. 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    Johnn Hai, PA-C 11/09/15 1342  Schuyler Amor, MD 11/09/15 [phone]

## 2015-11-13 DIAGNOSIS — E1165 Type 2 diabetes mellitus with hyperglycemia: Secondary | ICD-10-CM | POA: Diagnosis not present

## 2015-11-13 DIAGNOSIS — F17210 Nicotine dependence, cigarettes, uncomplicated: Secondary | ICD-10-CM | POA: Diagnosis not present

## 2015-11-13 DIAGNOSIS — I10 Essential (primary) hypertension: Secondary | ICD-10-CM | POA: Diagnosis not present

## 2015-11-13 DIAGNOSIS — L02811 Cutaneous abscess of head [any part, except face]: Secondary | ICD-10-CM | POA: Diagnosis not present

## 2015-11-20 DIAGNOSIS — R944 Abnormal results of kidney function studies: Secondary | ICD-10-CM | POA: Diagnosis not present

## 2016-01-17 DIAGNOSIS — L02811 Cutaneous abscess of head [any part, except face]: Secondary | ICD-10-CM | POA: Diagnosis not present

## 2016-01-17 DIAGNOSIS — I10 Essential (primary) hypertension: Secondary | ICD-10-CM | POA: Diagnosis not present

## 2016-01-17 DIAGNOSIS — E1165 Type 2 diabetes mellitus with hyperglycemia: Secondary | ICD-10-CM | POA: Diagnosis not present

## 2016-01-17 DIAGNOSIS — F17210 Nicotine dependence, cigarettes, uncomplicated: Secondary | ICD-10-CM | POA: Diagnosis not present

## 2016-02-01 DIAGNOSIS — E119 Type 2 diabetes mellitus without complications: Secondary | ICD-10-CM | POA: Diagnosis not present

## 2016-04-11 ENCOUNTER — Emergency Department: Payer: Medicare Other

## 2016-04-11 ENCOUNTER — Observation Stay
Admission: EM | Admit: 2016-04-11 | Discharge: 2016-04-12 | Disposition: A | Discharge status: Home / Self Care | Payer: Medicare Other | Attending: Internal Medicine | Admitting: Internal Medicine

## 2016-04-11 ENCOUNTER — Encounter: Payer: Self-pay | Admitting: *Deleted

## 2016-04-11 DIAGNOSIS — R55 Syncope and collapse: Principal | ICD-10-CM

## 2016-04-11 DIAGNOSIS — Z716 Tobacco abuse counseling: Secondary | ICD-10-CM

## 2016-04-11 DIAGNOSIS — F10920 Alcohol use, unspecified with intoxication, uncomplicated: Secondary | ICD-10-CM

## 2016-04-11 DIAGNOSIS — E785 Hyperlipidemia, unspecified: Secondary | ICD-10-CM | POA: Insufficient documentation

## 2016-04-11 DIAGNOSIS — E119 Type 2 diabetes mellitus without complications: Secondary | ICD-10-CM | POA: Diagnosis not present

## 2016-04-11 DIAGNOSIS — R4182 Altered mental status, unspecified: Secondary | ICD-10-CM

## 2016-04-11 DIAGNOSIS — Z8249 Family history of ischemic heart disease and other diseases of the circulatory system: Secondary | ICD-10-CM | POA: Insufficient documentation

## 2016-04-11 DIAGNOSIS — R0789 Other chest pain: Secondary | ICD-10-CM | POA: Diagnosis present

## 2016-04-11 DIAGNOSIS — J440 Chronic obstructive pulmonary disease with acute lower respiratory infection: Secondary | ICD-10-CM | POA: Insufficient documentation

## 2016-04-11 DIAGNOSIS — Z794 Long term (current) use of insulin: Secondary | ICD-10-CM | POA: Diagnosis not present

## 2016-04-11 DIAGNOSIS — J441 Chronic obstructive pulmonary disease with (acute) exacerbation: Secondary | ICD-10-CM

## 2016-04-11 DIAGNOSIS — I10 Essential (primary) hypertension: Secondary | ICD-10-CM | POA: Insufficient documentation

## 2016-04-11 DIAGNOSIS — Z79899 Other long term (current) drug therapy: Secondary | ICD-10-CM | POA: Insufficient documentation

## 2016-04-11 DIAGNOSIS — F10129 Alcohol abuse with intoxication, unspecified: Secondary | ICD-10-CM | POA: Insufficient documentation

## 2016-04-11 DIAGNOSIS — R42 Dizziness and giddiness: Secondary | ICD-10-CM

## 2016-04-11 DIAGNOSIS — F17210 Nicotine dependence, cigarettes, uncomplicated: Secondary | ICD-10-CM | POA: Insufficient documentation

## 2016-04-11 DIAGNOSIS — R05 Cough: Secondary | ICD-10-CM | POA: Diagnosis not present

## 2016-04-11 DIAGNOSIS — J209 Acute bronchitis, unspecified: Secondary | ICD-10-CM

## 2016-04-11 DIAGNOSIS — Z792 Long term (current) use of antibiotics: Secondary | ICD-10-CM | POA: Insufficient documentation

## 2016-04-11 DIAGNOSIS — Z23 Encounter for immunization: Secondary | ICD-10-CM | POA: Insufficient documentation

## 2016-04-11 DIAGNOSIS — R404 Transient alteration of awareness: Secondary | ICD-10-CM | POA: Diagnosis not present

## 2016-04-11 HISTORY — DX: Chronic obstructive pulmonary disease with (acute) exacerbation: J44.1

## 2016-04-11 LAB — COMPREHENSIVE METABOLIC PANEL
ALBUMIN: 4.3 g/dL (ref 3.5–5.0)
ALK PHOS: 42 U/L (ref 38–126)
ALT: 19 U/L (ref 14–54)
AST: 25 U/L (ref 15–41)
Anion gap: 13 (ref 5–15)
BILIRUBIN TOTAL: 0.6 mg/dL (ref 0.3–1.2)
BUN: 14 mg/dL (ref 6–20)
CALCIUM: 9.9 mg/dL (ref 8.9–10.3)
CO2: 25 mmol/L (ref 22–32)
Chloride: 100 mmol/L — ABNORMAL LOW (ref 101–111)
Creatinine, Ser: 0.96 mg/dL (ref 0.44–1.00)
GFR calc Af Amer: >60 mL/min (ref >60)
GFR calc non Af Amer: >60 mL/min (ref >60)
GLUCOSE: 237 mg/dL — AB (ref 65–99)
POTASSIUM: 3.4 mmol/L — AB (ref 3.5–5.1)
Sodium: 138 mmol/L (ref 135–145)
TOTAL PROTEIN: 8.5 g/dL — AB (ref 6.5–8.1)

## 2016-04-11 LAB — CBC WITH DIFFERENTIAL/PLATELET
BASOS ABS: 0.1 10*3/uL (ref 0–0.1)
BASOS PCT: 1 %
Eosinophils Absolute: 0.2 10*3/uL (ref 0–0.7)
Eosinophils Relative: 2 %
HEMATOCRIT: 42.3 % (ref 35.0–47.0)
HEMOGLOBIN: 14.2 g/dL (ref 12.0–16.0)
Lymphocytes Relative: 35 %
Lymphs Abs: 3.5 10*3/uL (ref 1.0–3.6)
MCH: 28.7 pg (ref 26.0–34.0)
MCHC: 33.6 g/dL (ref 32.0–36.0)
MCV: 85.4 fL (ref 80.0–100.0)
Monocytes Absolute: 0.6 10*3/uL (ref 0.2–0.9)
Monocytes Relative: 6 %
NEUTROS ABS: 5.5 10*3/uL (ref 1.4–6.5)
NEUTROS PCT: 56 %
Platelets: 255 10*3/uL (ref 150–440)
RBC: 4.95 MIL/uL (ref 3.80–5.20)
RDW: 14.6 % — ABNORMAL HIGH (ref 11.5–14.5)
WBC: 10 10*3/uL (ref 3.6–11.0)

## 2016-04-11 LAB — URINALYSIS COMPLETE WITH MICROSCOPIC (ARMC ONLY)
Bacteria, UA: NONE SEEN
Bilirubin Urine: NEGATIVE
Glucose, UA: 50 mg/dL — AB
Hgb urine dipstick: NEGATIVE
Ketones, ur: NEGATIVE mg/dL
Leukocytes, UA: NEGATIVE
Nitrite: NEGATIVE
PROTEIN: NEGATIVE mg/dL
SPECIFIC GRAVITY, URINE: 1.002 — AB (ref 1.005–1.030)
Squamous Epithelial / LPF: NONE SEEN
pH: 7 (ref 5.0–8.0)

## 2016-04-11 LAB — TROPONIN I: Troponin I: 0.03 ng/mL (ref <0.03)

## 2016-04-11 LAB — ETHANOL: Alcohol, Ethyl (B): 166 mg/dL — ABNORMAL HIGH (ref <5)

## 2016-04-11 LAB — LIPASE, BLOOD: Lipase: 46 U/L (ref 11–51)

## 2016-04-11 MED ORDER — ONDANSETRON HCL 4 MG/2ML IJ SOLN
4.0000 mg | Freq: Once | INTRAMUSCULAR | Status: AC
  Administered 2016-04-11: 4 mg via INTRAVENOUS
  Filled 2016-04-11: qty 2

## 2016-04-11 MED ORDER — SODIUM CHLORIDE 0.9 % IV BOLUS (SEPSIS)
1000.0000 mL | Freq: Once | INTRAVENOUS | Status: AC
  Administered 2016-04-11: 1000 mL via INTRAVENOUS

## 2016-04-11 NOTE — ED Provider Notes (Signed)
[facility] Emergency Department Provider Note  ____________________________________________   First MD Initiated Contact with Patient 04/11/16 2040     (approximate)  I have reviewed the triage vital signs and the nursing notes.   HISTORY  Chief Complaint Loss of Consciousness   HPI Dawn Alvarez is a 60 y.o. female with a history of diabetes, hypertension as well as hyperlipidemia who is presenting to the emergency department with altered mental status. Per family, she had a period of about 30 minutes where she was "talking nonsense."  Per her husband who is at the bedside she never completely lost consciousness. The patient says that she was feeling nauseous but denied any chest pain or shortness of breath. Once EMS arrived she regained her mental status. She continues to deny any chest pain or shortness of breath. She said that she has an ongoing lower abdominal as well as left hip pain which she experiences when she hasn't had enough to drink. Says that she did have one beer earlier tonight but says that she only drinks occasionally. Denies any drug use. Also says that she has a brother with a history of heart disease.   Past Medical History:  Diagnosis Date  . Asthma   . Diabetes mellitus without complication ([place])   . Hyperlipemia   . Hypertension     Patient Active Problem List   Diagnosis Date Noted  . Special screening for malignant neoplasms, colon   . Benign neoplasm of sigmoid colon     Past Surgical History:  Procedure Laterality Date  . CESAREAN SECTION    . COLONOSCOPY WITH PROPOFOL N/A 09/11/2015   Procedure: COLONOSCOPY WITH PROPOFOL;  Surgeon: Lucilla Lame, MD;  Location: [facility] ENDOSCOPY;  Service: Endoscopy;  Laterality: N/A;    Prior to Admission medications   Medication Sig Start Date End Date Taking? Authorizing Provider  bisoprolol-hydrochlorothiazide (ZIAC) 2.5-6.25 MG per tablet Take 1 tablet by mouth daily.    Historical  Provider, MD  cephALEXin (KEFLEX) 500 MG capsule Take 1 capsule (500 mg total) by mouth 3 (three) times daily. 11/09/15   Johnn Hai, PA-C  diazepam (VALIUM) 2 MG tablet Take 1 tablet (2 mg total) by mouth every 8 (eight) hours as needed for anxiety or muscle spasms. 01/14/15   Ahmed Prima, MD  glyBURIDE-metformin (GLUCOVANCE) 2.5-500 MG per tablet Take 2 tablets by mouth 2 (two) times daily.     Historical Provider, MD  hydrALAZINE (APRESOLINE) 25 MG tablet Take 25 mg by mouth 2 (two) times daily. Pt takes with a 50mg  tablet.    Historical Provider, MD  hydrALAZINE (APRESOLINE) 50 MG tablet Take 50 mg by mouth 2 (two) times daily. Pt takes with a 25mg  tablet.    Historical Provider, MD  ibuprofen (ADVIL,MOTRIN) 600 MG tablet Take 1 tablet (600 mg total) by mouth every 8 (eight) hours as needed for moderate pain. 01/30/15   Ahmed Prima, MD  levETIRAcetam (KEPPRA) 500 MG tablet Take 1 tablet (500 mg total) by mouth 2 (two) times daily. Patient not taking: Reported on 07/20/2015 01/05/15   Lisa Roca, MD  lisinopril (PRINIVIL,ZESTRIL) 20 MG tablet Take 20 mg by mouth daily.    Historical Provider, MD  mupirocin ointment (BACTROBAN) 2 % Apply to affected area 3 times daily 11/09/15 11/08/16  Johnn Hai, PA-C  polyethylene glycol-electrolytes (TRILYTE) 420 g solution Take 4,000 mLs by mouth as directed. Drink one 8oz glass every 30 mins until stools are clear 07/31/15  Lucilla Lame, MD  pravastatin (PRAVACHOL) 40 MG tablet Take 40 mg by mouth at bedtime.    Historical Provider, MD  zolpidem (AMBIEN) 5 MG tablet Take 5 mg by mouth at bedtime as needed for sleep.    Historical Provider, MD    Allergies Patient has no known allergies.  Family History  Problem Relation Age of Onset  . Diabetes Mother   . Hypertension Mother   . Hypertension Father   . Diabetes Father     Social History Social History  Substance Use Topics  . Smoking status: Current Every Day Smoker    Packs/day:  1.00    Years: 2.00    Types: Cigarettes  . Smokeless tobacco: Never Used  . Alcohol use 1.2 oz/week    2 Cans of beer per week    Review of Systems Constitutional: No fever/chills Eyes: No visual changes. ENT: No sore throat. Cardiovascular: Denies chest pain. Respiratory: Denies shortness of breath. Gastrointestinal: No abdominal pain.  No nausea, no vomiting.  No diarrhea.  No constipation. Genitourinary: Negative for dysuria. Musculoskeletal: Negative for back pain. Skin: Negative for rash. Neurological: Negative for headaches, focal weakness or numbness.  10-point ROS otherwise negative.  ____________________________________________   PHYSICAL EXAM:  VITAL SIGNS: ED Triage Vitals  Enc Vitals Group     BP 04/11/16 2034 (!) 191/96     Pulse Rate 04/11/16 2030 85     Resp 04/11/16 2030 (!) 22     Temp 04/11/16 2034 97.8 F (36.6 C)     Temp Source 04/11/16 2034 Oral     SpO2 04/11/16 2030 97 %     Weight 04/11/16 2032 187 lb (84.8 kg)     Height 04/11/16 2032 5\' 1"  (1.549 m)     Head Circumference --      Peak Flow --      Pain Score 04/11/16 2032 8     Pain Loc --      Pain Edu? --      Excl. in [place]? --     Constitutional: Alert and oriented. Well appearing and in no acute distress. Eyes: Conjunctivae are normal. PERRL. EOMI. Head: Atraumatic. Nose: No congestion/rhinnorhea. Mouth/Throat: Mucous membranes are moist.  Neck: No stridor.   Cardiovascular: Normal rate, regular rhythm. Grossly normal heart sounds.   Respiratory: Normal respiratory effort.  No retractions. Lungs CTAB. Gastrointestinal: Soft and nontender. No distention. Musculoskeletal: No lower extremity tenderness nor edema.  No joint effusions. Neurologic:  Normal speech and language. No gross focal neurologic deficits are appreciated.  Skin:  Skin is warm, dry and intact. No rash noted. Psychiatric: Mood and affect are normal. Speech and behavior are  normal.  ____________________________________________   LABS (all labs ordered are listed, but only abnormal results are displayed)  Labs Reviewed  CBC WITH DIFFERENTIAL/PLATELET - Abnormal; Notable for the following:       Result Value   RDW 14.6 (*)    All other components within normal limits  COMPREHENSIVE METABOLIC PANEL - Abnormal; Notable for the following:    Potassium 3.4 (*)    Chloride 100 (*)    Glucose, Bld 237 (*)    Total Protein 8.5 (*)    All other components within normal limits  TROPONIN I - Abnormal; Notable for the following:    Troponin I 0.03 (*)    All other components within normal limits  ETHANOL - Abnormal; Notable for the following:    Alcohol, Ethyl (B) 166 (*)  All other components within normal limits  URINALYSIS COMPLETEWITH MICROSCOPIC ([facility] ONLY) - Abnormal; Notable for the following:    Color, Urine COLORLESS (*)    APPearance CLEAR (*)    Glucose, UA 50 (*)    Specific Gravity, Urine 1.002 (*)    All other components within normal limits  LIPASE, BLOOD   ____________________________________________  EKG  ED ECG REPORT I, Bretta Fees,  Youlanda Roys, the attending physician, personally viewed and interpreted this ECG.   Date: 04/11/2016  EKG Time: 2033  Rate: 87  Rhythm: normal sinus rhythm  Axis: Normal  Intervals:none  ST&T Change: No ST segment elevation or depression. No abnormal T-wave inversion.  ____________________________________________  RADIOLOGY   ____________________________________________   PROCEDURES  Procedure(s) performed:   Procedures  Critical Care performed:   ____________________________________________   INITIAL IMPRESSION / ASSESSMENT AND PLAN / ED COURSE  Pertinent labs & imaging results that were available during my care of the patient were reviewed by me and considered in my medical decision making (see chart for details).  ----------------------------------------- 10:15 PM on  04/11/2016 -----------------------------------------  Patient with mildly elevated troponin. Has not had elevated troponin in the past and has normal kidney function. Multiple risk factors including her age as well as hypertension, hyperlipidemia, diabetes as well as a family history of heart disease. Possible stress response from alcohol. However, I'm concerned for near-syncope and possible ACS which could be presenting atypically. She'll be observed in the hospital overnight. I explained this plan and the family as well as the patient were understanding when to comply. Signed out to Dr. Ara Kussmaul.    Clinical Course      ____________________________________________   FINAL CLINICAL IMPRESSION(S) / ED DIAGNOSES  Alcohol intoxication. Altered mental status.    NEW MEDICATIONS STARTED DURING THIS VISIT:  New Prescriptions   No medications on file     Note:  This document was prepared using Dragon voice recognition software and may include unintentional dictation errors.    Orbie Pyo, MD 04/11/16 2216

## 2016-04-11 NOTE — ED Notes (Signed)
Pt reports passing out tonight.  No chest pain or sob.  Pt also reports h/a and abd pain.  No back pain.  No urinary sx.  No n/v/d.  Pt alert.  Speech clear.  md at bedside.  nsr on monitor.  Skin warm and dry.

## 2016-04-11 NOTE — ED Triage Notes (Signed)
Pt brought in via ems from friend's house.  Pt reports passing out today.  etoh use today.  No chest pain or sob.  Pt has abd pain and h/a.  Pt alert.  md in with pt.

## 2016-04-11 NOTE — ED Notes (Signed)
Pt in bed eating food from cookout brought by friends

## 2016-04-11 NOTE — ED Notes (Signed)
X-ray at bedside

## 2016-04-11 NOTE — ED Notes (Signed)
Lab called with troponin of 0.03  md ware.

## 2016-04-11 NOTE — ED Notes (Signed)
Hospitalist at bedside 

## 2016-04-12 ENCOUNTER — Encounter: Payer: Self-pay | Admitting: Internal Medicine

## 2016-04-12 DIAGNOSIS — J209 Acute bronchitis, unspecified: Secondary | ICD-10-CM

## 2016-04-12 DIAGNOSIS — R079 Chest pain, unspecified: Secondary | ICD-10-CM | POA: Diagnosis not present

## 2016-04-12 DIAGNOSIS — R55 Syncope and collapse: Secondary | ICD-10-CM | POA: Diagnosis not present

## 2016-04-12 DIAGNOSIS — R0789 Other chest pain: Secondary | ICD-10-CM | POA: Diagnosis not present

## 2016-04-12 DIAGNOSIS — R42 Dizziness and giddiness: Secondary | ICD-10-CM

## 2016-04-12 DIAGNOSIS — J441 Chronic obstructive pulmonary disease with (acute) exacerbation: Secondary | ICD-10-CM

## 2016-04-12 DIAGNOSIS — Z716 Tobacco abuse counseling: Secondary | ICD-10-CM

## 2016-04-12 HISTORY — DX: Chronic obstructive pulmonary disease with (acute) exacerbation: J44.1

## 2016-04-12 LAB — TROPONIN I
TROPONIN I: 0.03 ng/mL — AB (ref <0.03)
Troponin I: 0.03 ng/mL (ref <0.03)
Troponin I: 0.03 ng/mL (ref <0.03)

## 2016-04-12 LAB — GLUCOSE, CAPILLARY
Glucose-Capillary: 229 mg/dL — ABNORMAL HIGH (ref 65–99)
Glucose-Capillary: 152 mg/dL — ABNORMAL HIGH (ref 65–99)
Glucose-Capillary: 99 mg/dL (ref 65–99)

## 2016-04-12 LAB — LIPID PANEL
Cholesterol: 227 mg/dL — ABNORMAL HIGH (ref 0–200)
HDL: 37 mg/dL — AB (ref >40)
LDL CALC: UNDETERMINED mg/dL (ref 0–99)
TRIGLYCERIDES: 943 mg/dL — AB (ref <150)
Total CHOL/HDL Ratio: 6.1 RATIO
VLDL: UNDETERMINED mg/dL (ref 0–40)

## 2016-04-12 LAB — TSH: TSH: 0.878 u[IU]/mL (ref 0.350–4.500)

## 2016-04-12 MED ORDER — ENOXAPARIN SODIUM 40 MG/0.4ML ~~LOC~~ SOLN
40.0000 mg | Freq: Every day | SUBCUTANEOUS | Status: DC
  Administered 2016-04-12: 40 mg via SUBCUTANEOUS
  Filled 2016-04-12: qty 0.4

## 2016-04-12 MED ORDER — ISOSORBIDE MONONITRATE ER 30 MG PO TB24
30.0000 mg | ORAL_TABLET | Freq: Every day | ORAL | Status: DC
  Administered 2016-04-12: 30 mg via ORAL
  Filled 2016-04-12: qty 1

## 2016-04-12 MED ORDER — INFLUENZA VAC SPLIT QUAD 0.5 ML IM SUSY: 0.5000 mL | PREFILLED_SYRINGE | INTRAMUSCULAR | Status: DC

## 2016-04-12 MED ORDER — ACETAMINOPHEN 325 MG PO TABS: 650.0000 mg | ORAL_TABLET | ORAL | Status: DC | PRN

## 2016-04-12 MED ORDER — PNEUMOCOCCAL VAC POLYVALENT 25 MCG/0.5ML IJ INJ: 0.5000 mL | INJECTION | INTRAMUSCULAR | Status: DC

## 2016-04-12 MED ORDER — GEMFIBROZIL 600 MG PO TABS: 600.0000 mg | ORAL_TABLET | Freq: Two times a day (BID) | ORAL | 6 refills | Status: DC

## 2016-04-12 MED ORDER — ALBUTEROL SULFATE HFA 108 (90 BASE) MCG/ACT IN AERS: 2.0000 | INHALATION_SPRAY | RESPIRATORY_TRACT | 5 refills | Status: DC | PRN

## 2016-04-12 MED ORDER — AZITHROMYCIN 250 MG PO TABS: 250.0000 mg | ORAL_TABLET | Freq: Every day | ORAL | Status: DC

## 2016-04-12 MED ORDER — INSULIN DETEMIR 100 UNIT/ML ~~LOC~~ SOLN
18.0000 [IU] | Freq: Every day | SUBCUTANEOUS | Status: DC
  Administered 2016-04-12: 18 [IU] via SUBCUTANEOUS
  Filled 2016-04-12 (×2): qty 0.18

## 2016-04-12 MED ORDER — ALBUTEROL SULFATE HFA 108 (90 BASE) MCG/ACT IN AERS: 2.0000 | INHALATION_SPRAY | RESPIRATORY_TRACT | Status: DC | PRN

## 2016-04-12 MED ORDER — GEMFIBROZIL 600 MG PO TABS
600.0000 mg | ORAL_TABLET | Freq: Two times a day (BID) | ORAL | Status: DC
  Administered 2016-04-12: 600 mg via ORAL
  Filled 2016-04-12: qty 1

## 2016-04-12 MED ORDER — IPRATROPIUM-ALBUTEROL 0.5-2.5 (3) MG/3ML IN SOLN: 3.0000 mL | Freq: Four times a day (QID) | RESPIRATORY_TRACT | Status: DC | PRN

## 2016-04-12 MED ORDER — AZITHROMYCIN 500 MG PO TABS: ORAL_TABLET | ORAL | 0 refills | Status: DC

## 2016-04-12 MED ORDER — AMLODIPINE BESYLATE 10 MG PO TABS
10.0000 mg | ORAL_TABLET | Freq: Every day | ORAL | Status: DC
  Administered 2016-04-12: 10 mg via ORAL
  Filled 2016-04-12: qty 1

## 2016-04-12 MED ORDER — PRAVASTATIN SODIUM 40 MG PO TABS
40.0000 mg | ORAL_TABLET | Freq: Every day | ORAL | Status: DC
  Administered 2016-04-12: 40 mg via ORAL
  Filled 2016-04-12 (×2): qty 1

## 2016-04-12 MED ORDER — PNEUMOCOCCAL VAC POLYVALENT 25 MCG/0.5ML IJ INJ
0.5000 mL | INJECTION | Freq: Once | INTRAMUSCULAR | Status: AC
  Administered 2016-04-12: 0.5 mL via INTRAMUSCULAR
  Filled 2016-04-12: qty 0.5

## 2016-04-12 MED ORDER — AZITHROMYCIN 250 MG PO TABS
500.0000 mg | ORAL_TABLET | Freq: Every day | ORAL | Status: AC
  Administered 2016-04-12: 500 mg via ORAL
  Filled 2016-04-12: qty 2

## 2016-04-12 MED ORDER — ISOSORBIDE MONONITRATE ER 30 MG PO TB24
30.0000 mg | ORAL_TABLET | Freq: Every day | ORAL | 5 refills | Status: DC
Start: 2016-04-12 — End: 2019-01-27

## 2016-04-12 MED ORDER — GI COCKTAIL ~~LOC~~: 30.0000 mL | Freq: Four times a day (QID) | ORAL | Status: DC | PRN

## 2016-04-12 MED ORDER — ASPIRIN EC 81 MG PO TBEC
81.0000 mg | DELAYED_RELEASE_TABLET | Freq: Every day | ORAL | Status: DC
  Administered 2016-04-12: 81 mg via ORAL
  Filled 2016-04-12: qty 1

## 2016-04-12 MED ORDER — MORPHINE SULFATE (PF) 4 MG/ML IV SOLN: 2.0000 mg | INTRAVENOUS | Status: DC | PRN

## 2016-04-12 MED ORDER — INSULIN ASPART 100 UNIT/ML ~~LOC~~ SOLN
0.0000 [IU] | Freq: Three times a day (TID) | SUBCUTANEOUS | Status: DC
  Administered 2016-04-12: 3 [IU] via SUBCUTANEOUS
  Filled 2016-04-12: qty 3

## 2016-04-12 MED ORDER — ALBUTEROL SULFATE (2.5 MG/3ML) 0.083% IN NEBU: 2.5000 mg | INHALATION_SOLUTION | RESPIRATORY_TRACT | Status: DC | PRN

## 2016-04-12 MED ORDER — OMEGA-3-ACID ETHYL ESTERS 1 G PO CAPS
1.0000 g | ORAL_CAPSULE | Freq: Two times a day (BID) | ORAL | Status: DC
  Administered 2016-04-12: 1 g via ORAL
  Filled 2016-04-12: qty 1

## 2016-04-12 MED ORDER — ONDANSETRON HCL 4 MG/2ML IJ SOLN: 4.0000 mg | Freq: Four times a day (QID) | INTRAMUSCULAR | Status: DC | PRN

## 2016-04-12 MED ORDER — VITAMIN D 1000 UNITS PO TABS
2000.0000 [IU] | ORAL_TABLET | Freq: Every day | ORAL | Status: DC
  Administered 2016-04-12: 2000 [IU] via ORAL
  Filled 2016-04-12: qty 2

## 2016-04-12 MED ORDER — INFLUENZA VAC SPLIT QUAD 0.5 ML IM SUSY
0.5000 mL | PREFILLED_SYRINGE | Freq: Once | INTRAMUSCULAR | Status: AC
  Administered 2016-04-12: 0.5 mL via INTRAMUSCULAR
  Filled 2016-04-12: qty 0.5

## 2016-04-12 MED ORDER — INSULIN ASPART 100 UNIT/ML ~~LOC~~ SOLN
0.0000 [IU] | Freq: Every day | SUBCUTANEOUS | Status: DC
  Administered 2016-04-12: 100 [IU] via SUBCUTANEOUS
  Filled 2016-04-12: qty 2

## 2016-04-12 MED ORDER — LISINOPRIL 20 MG PO TABS
20.0000 mg | ORAL_TABLET | Freq: Every day | ORAL | Status: DC
  Administered 2016-04-12: 20 mg via ORAL
  Filled 2016-04-12: qty 1

## 2016-04-12 MED ORDER — HYDRALAZINE HCL 50 MG PO TABS
50.0000 mg | ORAL_TABLET | Freq: Three times a day (TID) | ORAL | Status: DC
  Administered 2016-04-12: 50 mg via ORAL
  Filled 2016-04-12: qty 1

## 2016-04-12 MED ORDER — ASPIRIN EC 325 MG PO TBEC
325.0000 mg | DELAYED_RELEASE_TABLET | Freq: Once | ORAL | Status: AC
  Administered 2016-04-12: 325 mg via ORAL
  Filled 2016-04-12: qty 1

## 2016-04-12 MED ORDER — OMEGA-3-ACID ETHYL ESTERS 1 G PO CAPS: 1.0000 g | ORAL_CAPSULE | Freq: Two times a day (BID) | ORAL | 6 refills | Status: DC

## 2016-04-12 MED ORDER — BISOPROLOL-HYDROCHLOROTHIAZIDE 2.5-6.25 MG PO TABS
1.0000 | ORAL_TABLET | Freq: Every day | ORAL | Status: DC
  Administered 2016-04-12: 1 via ORAL
  Filled 2016-04-12: qty 1

## 2016-04-12 MED ORDER — SODIUM CHLORIDE 0.9 % IV SOLN
INTRAVENOUS | Status: DC
  Administered 2016-04-12: 02:00:00 via INTRAVENOUS

## 2016-04-12 NOTE — Progress Notes (Signed)
Patient is admitted to room 259 with the diagnosis atypical chest pain. Alert and oriented x 4. Denied any acute pain right now. No acute respiratory distress noted. Tele box ( # 20) called to CCMD with Merry Proud NT as a second verifier. Skin assessment done with Kat RN, skin dry and intact. Husband at bedside voiced no concern. Will continue to monitor.

## 2016-04-12 NOTE — Discharge Summary (Addendum)
[place] [facility] [place] NAME: Dawn Alvarez    MR#:  [id number]  DATE OF BIRTH:  25-Jan-1956  DATE OF ADMISSION:  04/11/2016 ADMITTING PHYSICIAN: Ubaldo Glassing Hugelmeyer, DO  DATE OF DISCHARGE: No discharge date for patient encounter.  PRIMARY CARE PHYSICIAN: Lavera Guise, MD     ADMISSION DIAGNOSIS:  Alcoholic intoxication without complication ([place]) 123456 Altered mental status, unspecified altered mental status type [R41.82]  DISCHARGE DIAGNOSIS:  Principal Problem:   Syncope Active Problems:   Atypical chest pain   Dizziness and giddiness   Acute bronchitis   COPD exacerbation (HCC)   Tobacco abuse counseling   SECONDARY DIAGNOSIS:   Past Medical History:  Diagnosis Date  . Asthma   . COPD exacerbation ([place]) 04/12/2016  . Diabetes mellitus without complication ([place])   . Hyperlipemia   . Hypertension     .pro HOSPITAL COURSE:   The patient has 60-year-old African-American female with past medical history significant for history of COPD, asthma, ongoing tobacco abuse, diabetes, hyperlipidemia, hypertension, who presents to the hospital with complaints of syncopal episode, altered mental status. Patient admitted of some shortness of breath, wheezing, some cough. She also noted to have dizziness, postural changes, and developed chest pains after syncopal episode. On arrival to emergency room, patient's EKG reveals normal sinus rhythm with no acute changes, LVH. Troponin was minimally elevated at 0.03 and did not change 4. Triglycerides were markedly elevated above 900. The patient was admitted to the hospital, cardiac enzymes were cycled, cardiology consultation was obtained. Dr. Humphrey Rolls saw the patient in consultation and recommended outpatient stress test in the next few days. Meanwhile, patient was advised to continue Imdur. Patient's orthostatic vital signs were checked, and if they were found to be normal, oxygen saturations  were greater than 98% to 99% on room air at rest. Chest x-ray showed findings suggesting of mild bronchitic changes in the lungs. Clinically, patient had wheezing and somewhat diminished breath sounds. It was felt that patient had COPD exacerbation due to acute bronchitis and Zithromax was initiated as well as inhalation therapy with albuterol. Patient was advised to continue follow up with cardiologist as well as primary care physician for further recommendations. Discussion by problem:  #1 Syncope, unclear etiology, orthostatic vital signs were unremarkable, although patient received IV fluids while in the hospital. It is recommended to follow-up with primary care physician and make decisions about further investigations, including echocardiogram as outpatient, to be done by Dr. Humphrey Rolls #2. Chest pain, unremarkable EKG as well as cardiac enzymes, patient is to continue Imdur and follow up with cardiologist for stress test as well as echocardiogram as outpatient, appointment is made for Monday at 1:00 #3. Acute bronchitis, patient is to continue Zithromax #4. COPD exacerbation, patient is to be initiated on albuterol inhalers #5. Tobacco abuse. Counseling, discussed this patient for 4 minutes, nicotine replacement was offered, denied #6. Hyperlipidemia, initiate patient on Lopid and omega fatty acids DISCHARGE CONDITIONS:   Stable  CONSULTS OBTAINED:  Treatment Team:  Dionisio David, MD  DRUG ALLERGIES:  No Known Allergies  DISCHARGE MEDICATIONS:   Current Discharge Medication List    START taking these medications   Details  albuterol (PROVENTIL HFA;VENTOLIN HFA) 108 (90 Base) MCG/ACT inhaler Inhale 2 puffs into the lungs every 4 (four) hours as needed for wheezing or shortness of breath. Qty: 1 Inhaler, Refills: 5    azithromycin (ZITHROMAX) 500 MG tablet Take one pill daily, thank you Qty: 5 tablet,  Refills: 0    gemfibrozil (LOPID) 600 MG tablet Take 1 tablet (600 mg total) by mouth  2 (two) times daily before a meal. Qty: 60 tablet, Refills: 6    isosorbide mononitrate (IMDUR) 30 MG 24 hr tablet Take 1 tablet (30 mg total) by mouth daily. Qty: 30 tablet, Refills: 5    omega-3 acid ethyl esters (LOVAZA) 1 g capsule Take 1 capsule (1 g total) by mouth 2 (two) times daily. Qty: 60 capsule, Refills: 6      CONTINUE these medications which have NOT CHANGED   Details  amLODipine (NORVASC) 10 MG tablet Take 10 mg by mouth daily.    bisoprolol-hydrochlorothiazide (ZIAC) 2.5-6.25 MG per tablet Take 1 tablet by mouth daily.    Cholecalciferol (VITAMIN D3) 2000 units TABS Take 1 tablet by mouth daily.    glyBURIDE (DIABETA) 5 MG tablet Take 5 mg by mouth 2 (two) times daily with a meal.    hydrALAZINE (APRESOLINE) 50 MG tablet Take 50 mg by mouth 3 (three) times daily. Pt takes with a 25mg  tablet.    insulin detemir (LEVEMIR) 100 UNIT/ML injection Inject 18 Units into the skin at bedtime.    lisinopril (PRINIVIL,ZESTRIL) 20 MG tablet Take 20 mg by mouth daily.    metFORMIN (GLUCOPHAGE) 500 MG tablet Take 1,000 mg by mouth 2 (two) times daily with a meal.    pravastatin (PRAVACHOL) 40 MG tablet Take 40 mg by mouth at bedtime.         DISCHARGE INSTRUCTIONS:    The patient is to follow-up with her primary care physician, cardiologist as outpatient  If you experience worsening of your admission symptoms, develop shortness of breath, life threatening emergency, suicidal or homicidal thoughts you must seek medical attention immediately by calling 911 or calling your MD immediately  if symptoms less severe.  You Must read complete instructions/literature along with all the possible adverse reactions/side effects for all the Medicines you take and that have been prescribed to you. Take any new Medicines after you have completely understood and accept all the possible adverse reactions/side effects.   Please note  You were cared for by a hospitalist during your  hospital stay. If you have any questions about your discharge medications or the care you received while you were in the hospital after you are discharged, you can call the unit and asked to speak with the hospitalist on call if the hospitalist that took care of you is not available. Once you are discharged, your primary care physician will handle any further medical issues. Please note that NO REFILLS for any discharge medications will be authorized once you are discharged, as it is imperative that you return to your primary care physician (or establish a relationship with a primary care physician if you do not have one) for your aftercare needs so that they can reassess your need for medications and monitor your lab values.    Today   CHIEF COMPLAINT:   Chief Complaint  Patient presents with  . Loss of Consciousness    HISTORY OF PRESENT ILLNESS:  Dawn Alvarez  is a 60 y.o. female with a known history of COPD, asthma, ongoing tobacco abuse, diabetes, hyperlipidemia, hypertension, who presents to the hospital with complaints of syncopal episode, altered mental status. Patient admitted of some shortness of breath, wheezing, some cough. She also noted to have dizziness, postural changes, and developed chest pains after syncopal episode. On arrival to emergency room, patient's EKG reveals normal sinus rhythm with  no acute changes, LVH. Troponin was minimally elevated at 0.03 and did not change 4. Triglycerides were markedly elevated above 900. The patient was admitted to the hospital, cardiac enzymes were cycled, cardiology consultation was obtained. Dr. Humphrey Rolls saw the patient in consultation and recommended outpatient stress test in the next few days. Meanwhile, patient was advised to continue Imdur. Patient's orthostatic vital signs were checked, and if they were found to be normal, oxygen saturations were greater than 98% to 99% on room air at rest. Chest x-ray showed findings suggesting of mild  bronchitic changes in the lungs. Clinically, patient had wheezing and somewhat diminished breath sounds. It was felt that patient had COPD exacerbation due to acute bronchitis and Zithromax was initiated as well as inhalation therapy with albuterol. Patient was advised to continue follow up with cardiologist as well as primary care physician for further recommendations. Discussion by problem:  #1 Syncope, unclear etiology, orthostatic vital signs were unremarkable, although patient received IV fluids while in the hospital. It is recommended to follow-up with primary care physician and make decisions about further investigations, including echocardiogram as outpatient, to be done by Dr. Humphrey Rolls #2. Chest pain, unremarkable EKG as well as cardiac enzymes, patient is to continue Imdur and follow up with cardiologist for stress test as well as echocardiogram as outpatient, appointment is made for Monday at 1:00 #3. Acute bronchitis, patient is to continue Zithromax #4. COPD exacerbation, patient is to be initiated on albuterol inhalers #5. Tobacco abuse. Counseling, discussed this patient for 4 minutes, nicotine replacement was offered, denied #6. Hyperlipidemia, initiate patient on Lopid and omega fatty acids   VITAL SIGNS:  Blood pressure 117/67, pulse 77, temperature 98 F (36.7 C), temperature source Oral, resp. rate 18, height 5\' 1"  (1.549 m), weight 80.2 kg (176 lb 12.8 oz), SpO2 98 %.  I/O:    Intake/Output Summary (Last 24 hours) at 04/12/16 1406 Last data filed at 04/12/16 1300  Gross per 24 hour  Intake             1500 ml  Output                0 ml  Net             1500 ml    PHYSICAL EXAMINATION:  GENERAL:  60 y.o.-year-old patient lying in the bed with no acute distress.  EYES: Pupils equal, round, reactive to light and accommodation. No scleral icterus. Extraocular muscles intact.  HEENT: Head atraumatic, normocephalic. Oropharynx and nasopharynx clear.  NECK:  Supple, no jugular  venous distention. No thyroid enlargement, no tenderness.  LUNGS: Some diminished breath sounds bilaterally, scattered anterior wheezing, no rales,rhonchi or crepitations . 40. No use of accessory muscles of respiration.  CARDIOVASCULAR: S1, S2 normal. No murmurs, rubs, or gallops.  ABDOMEN: Soft, non-tender, non-distended. Bowel sounds present. No organomegaly or mass.  EXTREMITIES: No pedal edema, cyanosis, or clubbing.  NEUROLOGIC: Cranial nerves II through XII are intact. Muscle strength 5/5 in all extremities. Sensation intact. Gait not checked.  PSYCHIATRIC: The patient is alert and oriented x 3.  SKIN: No obvious rash, lesion, or ulcer.   DATA REVIEW:   CBC  Recent Labs Lab 04/11/16 2034  WBC 10.0  HGB 14.2  HCT 42.3  PLT 255    Chemistries   Recent Labs Lab 04/11/16 2034  NA 138  K 3.4*  CL 100*  CO2 25  GLUCOSE 237*  BUN 14  CREATININE 0.96  CALCIUM 9.9  AST 25  ALT 19  ALKPHOS 42  BILITOT 0.6    Cardiac Enzymes  Recent Labs Lab 04/12/16 0720  TROPONINI 0.03*    Microbiology Results  Results for orders placed or performed in visit on 05/25/13  Clostridium Difficile [facility])     Status: None   Collection Time: 05/26/13  2:25 AM  Result Value Ref Range Status   Micro Text Report   Final       C.DIFFICILE ANTIGEN       C.DIFFICILE GDH ANTIGEN : POSITIVE   C.DIFFICILE TOXIN A/B     C.DIFFICILE TOXINS A AND/OR B: POSITIVE   INTERPRETATION            Positive for toxigenic C. difficile, active toxin production present.    ANTIBIOTIC                                                      Stool culture     Status: None   Collection Time: 05/26/13  8:15 AM  Result Value Ref Range Status   Micro Text Report   Final       COMMENT                   NO SALMONELLA OR SHIGELLA ISOLATED   COMMENT                   NO PATHOGENIC E.COLI DETECTED   COMMENT                   POSITIVE CAMPYLOBACTER AG (JEJUNI/COLI)   ANTIBIOTIC                                                         RADIOLOGY:  Dg Chest 1 View  Result Date: 04/11/2016 CLINICAL DATA:  Altered mental status.  Cough for 1 week. EXAM: CHEST 1 VIEW COMPARISON:  01/30/2015 FINDINGS: The cardiac silhouette is top-normal in size. There is aortic atherosclerosis. No pneumonic consolidation, effusion or pulmonary vascular abnormalities. Minimal peribronchial thickening suggesting especially in the right lower lobe that may reflect mild bronchitic change. Soft tissue calcifications are seen adjacent to the left humeral head consistent with rotator cuff tendinopathy or calcific bursitis. Spurring is noted off the undersurface of the left AC joint consistent with osteoarthritis. IMPRESSION: Findings suggest mild bronchitic change of the lungs. Calcific bursitis or rotator cuff tendinopathy about the left shoulder. Electronically Signed   By: Ashley Royalty M.D.   On: 04/11/2016 22:48    EKG:   Orders placed or performed during the hospital encounter of 04/11/16  . EKG 12-Lead  . EKG 12-Lead  . EKG 12-Lead (at 6am)  . EKG 12-Lead (Repeat cardiac markers, recurrent chest pain)  . EKG 12-Lead (at 6am)  . EKG 12-Lead (Repeat cardiac markers, recurrent chest pain)      Management plans discussed with the patient, family and they are in agreement.  CODE STATUS:     Code Status Orders        Start     Ordered   04/12/16 0055  Full code  Continuous     04/12/16 0054    Code Status History  Date Active Date Inactive Code Status Order ID Comments User Context   This patient has a current code status but no historical code status.      TOTAL TIME TAKING CARE OF THIS PATIENT: 40 minutes.    Theodoro Grist M.D on 04/12/2016 at 2:06 PM  Between 7am to 6pm - Pager - [phone]  After 6pm go to [email or web address] - password [email or web address] [facility]  Office  [phone]  CC: Primary care physician; Lavera Guise, MD

## 2016-04-12 NOTE — Progress Notes (Signed)
Dawn Alvarez is a 60 y.o. female  [id number]  Primary Cardiologist: Neoma Laming Reason for Consultation: Loss of consciousness  HPI: This is a 60 year old African-American female with a history of diabetes hyperlipidemia and hypertension but no history of coronary artery disease presented to the hospital with loss of consciousness where she got confused and all of a sudden passed out for a few seconds. She had some atypical chest pain and shortness of breath associated with it.   Review of Systems: No orthopnea PND or leg swelling   Past Medical History:  Diagnosis Date  . Asthma   . Diabetes mellitus without complication ([place])   . Hyperlipemia   . Hypertension     Medications Prior to Admission  Medication Sig Dispense Refill  . amLODipine (NORVASC) 10 MG tablet Take 10 mg by mouth daily.    . bisoprolol-hydrochlorothiazide (ZIAC) 2.5-6.25 MG per tablet Take 1 tablet by mouth daily.    . Cholecalciferol (VITAMIN D3) 2000 units TABS Take 1 tablet by mouth daily.    Marland Kitchen glyBURIDE (DIABETA) 5 MG tablet Take 5 mg by mouth 2 (two) times daily with a meal.    . hydrALAZINE (APRESOLINE) 50 MG tablet Take 50 mg by mouth 3 (three) times daily. Pt takes with a 25mg  tablet.    . insulin detemir (LEVEMIR) 100 UNIT/ML injection Inject 18 Units into the skin at bedtime.    Marland Kitchen lisinopril (PRINIVIL,ZESTRIL) 20 MG tablet Take 20 mg by mouth daily.    . metFORMIN (GLUCOPHAGE) 500 MG tablet Take 1,000 mg by mouth 2 (two) times daily with a meal.    . pravastatin (PRAVACHOL) 40 MG tablet Take 40 mg by mouth at bedtime.       Marland Kitchen amLODipine  10 mg Oral Daily  . aspirin EC  81 mg Oral Daily  . bisoprolol-hydrochlorothiazide  1 tablet Oral Daily  . cholecalciferol  2,000 Units Oral Daily  . enoxaparin (LOVENOX) injection  40 mg Subcutaneous QHS  . gemfibrozil  600 mg Oral BID AC  . hydrALAZINE  50 mg Oral TID  . [START ON 04/13/2016] Influenza vac split quadrivalent PF  0.5 mL Intramuscular  Tomorrow-1000  . insulin aspart  0-15 Units Subcutaneous TID WC  . insulin aspart  0-5 Units Subcutaneous QHS  . insulin detemir  18 Units Subcutaneous QHS  . lisinopril  20 mg Oral Daily  . omega-3 acid ethyl esters  1 g Oral BID  . [START ON 04/13/2016] pneumococcal 23 valent vaccine  0.5 mL Intramuscular Tomorrow-1000  . pravastatin  40 mg Oral QHS    Infusions: . sodium chloride 75 mL/hr at 04/12/16 0144    No Known Allergies  Social History   Social History  . Marital status: Married    Spouse name: N/A  . Number of children: N/A  . Years of education: N/A   Occupational History  . Not on file.   Social History Main Topics  . Smoking status: Current Every Day Smoker    Packs/day: 1.00    Years: 2.00    Types: Cigarettes  . Smokeless tobacco: Never Used  . Alcohol use 1.2 oz/week    2 Cans of beer per week  . Drug use: No  . Sexual activity: No   Other Topics Concern  . Not on file   Social History Narrative  . No narrative on file    Family History  Problem Relation Age of Onset  . Diabetes Mother   . Hypertension  Mother   . Hypertension Father   . Diabetes Father     PHYSICAL EXAM: Vitals:   04/12/16 0055 04/12/16 0451  BP: (!) 154/69 133/70  Pulse: 86 85  Resp: 15 17  Temp: 98 F (36.7 C) 97.7 F (36.5 C)     Intake/Output Summary (Last 24 hours) at 04/12/16 1012 Last data filed at 04/12/16 0700  Gross per 24 hour  Intake              900 ml  Output                0 ml  Net              900 ml    General:  Well appearing. No respiratory difficulty HEENT: normal Neck: supple. no JVD. Carotids 2+ bilat; no bruits. No lymphadenopathy or thryomegaly appreciated. Cor: PMI nondisplaced. Regular rate & rhythm. No rubs, gallops or murmurs. Lungs: clear Abdomen: soft, nontender, nondistended. No hepatosplenomegaly. No bruits or masses. Good bowel sounds. Extremities: no cyanosis, clubbing, rash, edema Neuro: alert & oriented x 3, cranial  nerves grossly intact. moves all 4 extremities w/o difficulty. Affect pleasant.  [id number] sinus rhythm no acute changes LVH  Results for orders placed or performed during the hospital encounter of 04/11/16 (from the past 24 hour(s))  CBC with Differential     Status: Abnormal   Collection Time: 04/11/16  8:34 PM  Result Value Ref Range   WBC 10.0 3.6 - 11.0 K/uL   RBC 4.95 3.80 - 5.20 MIL/uL   Hemoglobin 14.2 12.0 - 16.0 g/dL   HCT 42.3 35.0 - 47.0 %   MCV 85.4 80.0 - 100.0 fL   MCH 28.7 26.0 - 34.0 pg   MCHC 33.6 32.0 - 36.0 g/dL   RDW 14.6 (H) 11.5 - 14.5 %   Platelets 255 150 - 440 K/uL   Neutrophils Relative % 56 %   Neutro Abs 5.5 1.4 - 6.5 K/uL   Lymphocytes Relative 35 %   Lymphs Abs 3.5 1.0 - 3.6 K/uL   Monocytes Relative 6 %   Monocytes Absolute 0.6 0.2 - 0.9 K/uL   Eosinophils Relative 2 %   Eosinophils Absolute 0.2 0 - 0.7 K/uL   Basophils Relative 1 %   Basophils Absolute 0.1 0 - 0.1 K/uL  Comprehensive metabolic panel     Status: Abnormal   Collection Time: 04/11/16  8:34 PM  Result Value Ref Range   Sodium 138 135 - 145 mmol/L   Potassium 3.4 (L) 3.5 - 5.1 mmol/L   Chloride 100 (L) 101 - 111 mmol/L   CO2 25 22 - 32 mmol/L   Glucose, Bld 237 (H) 65 - 99 mg/dL   BUN 14 6 - 20 mg/dL   Creatinine, Ser 0.96 0.44 - 1.00 mg/dL   Calcium 9.9 8.9 - 10.3 mg/dL   Total Protein 8.5 (H) 6.5 - 8.1 g/dL   Albumin 4.3 3.5 - 5.0 g/dL   AST 25 15 - 41 U/L   ALT 19 14 - 54 U/L   Alkaline Phosphatase 42 38 - 126 U/L   Total Bilirubin 0.6 0.3 - 1.2 mg/dL   GFR calc non Af Amer >60 >60 mL/min   GFR calc Af Amer >60 >60 mL/min   Anion gap 13 5 - 15  Lipase, blood     Status: None   Collection Time: 04/11/16  8:34 PM  Result Value Ref Range   Lipase 46 11 - 51 U/L  Troponin I     Status: Abnormal   Collection Time: 04/11/16  8:34 PM  Result Value Ref Range   Troponin I 0.03 (HH) <0.03 ng/mL  Ethanol     Status: Abnormal   Collection Time: 04/11/16  8:34 PM  Result  Value Ref Range   Alcohol, Ethyl (B) 166 (H) <5 mg/dL  Urinalysis complete, with microscopic ([facility] only)     Status: Abnormal   Collection Time: 04/11/16  8:34 PM  Result Value Ref Range   Color, Urine COLORLESS (A) YELLOW   APPearance CLEAR (A) CLEAR   Glucose, UA 50 (A) NEGATIVE mg/dL   Bilirubin Urine NEGATIVE NEGATIVE   Ketones, ur NEGATIVE NEGATIVE mg/dL   Specific Gravity, Urine 1.002 (L) 1.005 - 1.030   Hgb urine dipstick NEGATIVE NEGATIVE   pH 7.0 5.0 - 8.0   Protein, ur NEGATIVE NEGATIVE mg/dL   Nitrite NEGATIVE NEGATIVE   Leukocytes, UA NEGATIVE NEGATIVE   RBC / HPF 0-5 0 - 5 RBC/hpf   WBC, UA 0-5 0 - 5 WBC/hpf   Bacteria, UA NONE SEEN NONE SEEN   Squamous Epithelial / LPF NONE SEEN NONE SEEN  Glucose, capillary     Status: Abnormal   Collection Time: 04/12/16  1:02 AM  Result Value Ref Range   Glucose-Capillary 229 (H) 65 - 99 mg/dL  Troponin I-serum (0, 3, 6 hours)     Status: Abnormal   Collection Time: 04/12/16  1:30 AM  Result Value Ref Range   Troponin I 0.03 (HH) <0.03 ng/mL  Lipid panel     Status: Abnormal   Collection Time: 04/12/16  1:30 AM  Result Value Ref Range   Cholesterol 227 (H) 0 - 200 mg/dL   Triglycerides 943 (H) <150 mg/dL   HDL 37 (L) >40 mg/dL   Total CHOL/HDL Ratio 6.1 RATIO   VLDL UNABLE TO CALCULATE IF TRIGLYCERIDE OVER 400 mg/dL 0 - 40 mg/dL   LDL Cholesterol UNABLE TO CALCULATE IF TRIGLYCERIDE OVER 400 mg/dL 0 - 99 mg/dL  TSH     Status: None   Collection Time: 04/12/16  1:30 AM  Result Value Ref Range   TSH 0.878 0.350 - 4.500 uIU/mL  Troponin I-serum (0, 3, 6 hours)     Status: Abnormal   Collection Time: 04/12/16  4:21 AM  Result Value Ref Range   Troponin I 0.03 (HH) <0.03 ng/mL  Troponin I-serum (0, 3, 6 hours)     Status: Abnormal   Collection Time: 04/12/16  7:20 AM  Result Value Ref Range   Troponin I 0.03 (HH) <0.03 ng/mL  Glucose, capillary     Status: Abnormal   Collection Time: 04/12/16  7:31 AM  Result Value Ref  Range   Glucose-Capillary 152 (H) 65 - 99 mg/dL   Comment 1 Notify RN    Comment 2 Document in Chart    Dg Chest 1 View  Result Date: 04/11/2016 CLINICAL DATA:  Altered mental status.  Cough for 1 week. EXAM: CHEST 1 VIEW COMPARISON:  01/30/2015 FINDINGS: The cardiac silhouette is top-normal in size. There is aortic atherosclerosis. No pneumonic consolidation, effusion or pulmonary vascular abnormalities. Minimal peribronchial thickening suggesting especially in the right lower lobe that may reflect mild bronchitic change. Soft tissue calcifications are seen adjacent to the left humeral head consistent with rotator cuff tendinopathy or calcific bursitis. Spurring is noted off the undersurface of the left AC joint consistent with osteoarthritis. IMPRESSION: Findings suggest mild bronchitic change of the lungs. Calcific bursitis or  rotator cuff tendinopathy about the left shoulder. Electronically Signed   By: Ashley Royalty M.D.   On: 04/11/2016 22:48     ASSESSMENT AND PLAN:Syncopal episode etiology is unclear but had mildly elevated troponin with unremarkable EKG and atypical chest pain. Patient can be discharged on aspirin and nitrates isosorbide 30 mg once a day. I can see the patient on Monday at 1 PM and will schedule outpatient stress tests and an echocardiogram.  Willma Obando A

## 2016-04-12 NOTE — H&P (Signed)
[place] @ [facility] Admission History and Physical Dawn Alvarez, D.O.  ---------------------------------------------------------------------------------------------------------------------   PATIENT NAME: Dawn Alvarez MR#: [id number] DATE OF BIRTH: 09-18-1955 DATE OF ADMISSION: 04/11/2016 PRIMARY CARE PHYSICIAN: Lavera Guise, MD  REQUESTING/REFERRING PHYSICIAN: ED Dr. Clearnce Hasten  CHIEF COMPLAINT: Chief Complaint  Patient presents with  . Loss of Consciousness    HISTORY OF PRESENT ILLNESS: Dawn Alvarez is a 60 y.o. female with a known history of Diabetes, hypertension, hyperlipidemia and asthma presents to the emergency department for evaluation of altered mental status. y.o. female with a known history of Diabetes, hypertension, hyperlipidemia and asthma presents to the emergency department for evaluation of altered mental status.  Patient was in a usual state of health until this evening when patient had a period of confusion per her husband. He states that she was not making sense when she was speaking to him. On questioning patient states that at the time she felt very short of breath and had some vague chest discomfort but denies pain, palpitations, pressure. She also denies diaphoresis, nausea. Patient and her husband indicate that her symptoms have resolved entirely. She denies all symptoms at present..  Otherwise there has been no change in status. Patient has been taking medication as prescribed and there has been no recent change in medication or diet.  There has been no recent illness, travel or sick contacts.    PAST MEDICAL HISTORY: Past Medical History:  Diagnosis Date  . Asthma   . Diabetes mellitus without complication ([place])   . Hyperlipemia   . Hypertension       PAST SURGICAL HISTORY: Past Surgical History:  Procedure Laterality Date  . CESAREAN SECTION    . COLONOSCOPY WITH PROPOFOL N/A 09/11/2015   Procedure: COLONOSCOPY WITH PROPOFOL;  Surgeon: Lucilla Lame, MD;  Location: [facility] ENDOSCOPY;  Service: Endoscopy;  Laterality: N/A;      SOCIAL HISTORY: Social History  Substance Use Topics  .  Smoking status: Current Every Day Smoker    Packs/day: 1.00    Years: 2.00    Types: Cigarettes  . Smokeless tobacco: Never Used  . Alcohol use 1.2 oz/week    2 Cans of beer per week      FAMILY HISTORY: Family History  Problem Relation Age of Onset  . Diabetes Mother   . Hypertension Mother   . Hypertension Father   . Diabetes Father      MEDICATIONS AT HOME: Prior to Admission medications   Medication Sig Start Date End Date Taking? Authorizing Provider  amLODipine (NORVASC) 10 MG tablet Take 10 mg by mouth daily.   Yes Historical Provider, MD  bisoprolol-hydrochlorothiazide (ZIAC) 2.5-6.25 MG per tablet Take 1 tablet by mouth daily.   Yes Historical Provider, MD  Cholecalciferol (VITAMIN D3) 2000 units TABS Take 1 tablet by mouth daily.   Yes Historical Provider, MD  glyBURIDE (DIABETA) 5 MG tablet Take 5 mg by mouth 2 (two) times daily with a meal.   Yes Historical Provider, MD  hydrALAZINE (APRESOLINE) 50 MG tablet Take 50 mg by mouth 3 (three) times daily. Pt takes with a 25mg  tablet.   Yes Historical Provider, MD  insulin detemir (LEVEMIR) 100 UNIT/ML injection Inject 18 Units into the skin at bedtime.   Yes Historical Provider, MD  lisinopril (PRINIVIL,ZESTRIL) 20 MG tablet Take 20 mg by mouth daily.   Yes Historical Provider, MD  metFORMIN (GLUCOPHAGE) 500 MG tablet Take 1,000 mg by mouth 2 (two) times daily with a meal.   Yes Historical Provider, MD  pravastatin (PRAVACHOL) 40 MG tablet Take 40 mg by mouth at bedtime.   Yes Historical Provider, MD  DRUG ALLERGIES: No Known Allergies   REVIEW OF SYSTEMS: CONSTITUTIONAL: No fatigue, weakness, fever, chills, weight gain/loss, headache EYES: No blurry or double vision. ENT: No tinnitus, postnasal drip, redness or soreness of the oropharynx. RESPIRATORY: Positive dyspnea, negative cough, wheeze, hemoptysis. CARDIOVASCULAR: Positive "chest discomfort, "No chest pain, negative orthopnea, palpitations,  syncope. GASTROINTESTINAL: No nausea, vomiting, constipation, diarrhea, abdominal pain. No hematemesis, melena or hematochezia. GENITOURINARY: No dysuria, frequency, hematuria. ENDOCRINE: No polyuria or nocturia. No heat or cold intolerance. HEMATOLOGY: No anemia, bruising, bleeding. INTEGUMENTARY: No rashes, ulcers, lesions. MUSCULOSKELETAL: No pain, arthritis, swelling, gout. NEUROLOGIC: No numbness, tingling, weakness or ataxia. No seizure-type activity. PSYCHIATRIC: No anxiety, depression, insomnia.  PHYSICAL EXAMINATION: VITAL SIGNS: Blood pressure (!) 154/69, pulse 86, temperature 98 F (36.7 C), temperature source Oral, resp. rate 15, height 5\' 1"  (1.549 m), weight 84.8 kg (187 lb), SpO2 99 %.  GENERAL: 60 y.o.-year-old black female patient, well-developed, well-nourished lying in the bed in no acute distress. female patient, well-developed, well-nourished lying in the bed in no acute distress.  Pleasant and cooperative.   HEENT: Head atraumatic, normocephalic. Pupils equal, round, reactive to light and accommodation. No scleral icterus. Extraocular muscles intact. Oropharynx is clear. Mucus membranes moist. NECK: Supple, full range of motion. No JVD, no bruit heard. No cervical lymphadenopathy. CHEST: Normal breath sounds bilaterally. No wheezing, rales, rhonchi or crackles. No use of accessory muscles of respiration.  No reproducible chest wall tenderness.  CARDIOVASCULAR: S1, S2 normal. No murmurs, rubs, or gallops appreciated.  ABDOMEN: Soft, nontender, nondistended. No rebound, guarding, rigidity. Normoactive bowel sounds present in all four quadrants. No organomegaly or mass. EXTREMITIES: No pedal edema, cyanosis, or clubbing. NEUROLOGIC: Cranial nerves II through XII are grossly intact with no focal sensorimotor deficit.  PSYCHIATRIC: The patient is alert and oriented x 3. Normal affect, mood, thought content. SKIN: Warm, dry, and intact without obvious rash, lesion, or ulcer.  LABORATORY PANEL:  CBC  Recent Labs Lab 04/11/16 2034  WBC 10.0  HGB 14.2   HCT 42.3  PLT 255   ----------------------------------------------------------------------------------------------------------------- Chemistries  Recent Labs Lab 04/11/16 2034  NA 138  K 3.4*  CL 100*  CO2 25  GLUCOSE 237*  BUN 14  CREATININE 0.96  CALCIUM 9.9  AST 25  ALT 19  ALKPHOS 42  BILITOT 0.6   ------------------------------------------------------------------------------------------------------------------ Cardiac Enzymes  Recent Labs Lab 04/11/16 2034  TROPONINI 0.03*   ------------------------------------------------------------------------------------------------------------------  RADIOLOGY: Dg Chest 1 View  Result Date: 04/11/2016 CLINICAL DATA:  Altered mental status.  Cough for 1 week. EXAM: CHEST 1 VIEW COMPARISON:  01/30/2015 FINDINGS: The cardiac silhouette is top-normal in size. There is aortic atherosclerosis. No pneumonic consolidation, effusion or pulmonary vascular abnormalities. Minimal peribronchial thickening suggesting especially in the right lower lobe that may reflect mild bronchitic change. Soft tissue calcifications are seen adjacent to the left humeral head consistent with rotator cuff tendinopathy or calcific bursitis. Spurring is noted off the undersurface of the left AC joint consistent with osteoarthritis. IMPRESSION: Findings suggest mild bronchitic change of the lungs. Calcific bursitis or rotator cuff tendinopathy about the left shoulder. Electronically Signed   By: Ashley Royalty M.D.   On: 04/11/2016 22:48    EKG: Normal sinus rhythm at 87 bpm with normal axis and nonspecific ST-T wave changes.   IMPRESSION AND PLAN:  This is a 60 y.o. female with a history of asthma, diabetes, hypertension, hyperlipidemia now being admitted with: 1. Atypical chest pain, rule out ACS - Admit to observation with telemetry monitoring. - Trend troponins, check lipids and TSH. - Morphine, nitro, beta blocker, aspirin and  statin ordered.  Duoneb for  shortness of breath - Cardiology consult requested for consideration of additional workup.  2. Altered mental status likely secondary to acute alcohol intoxication. Patient is at baseline. We'll continue to observe. Neuro checks every 4 hours. Monitor for symptoms of alcohol withdrawal syndrome and initiate CIWA protocol if necessary. 3. History of hypertension-continue Norvasc, Ziac, hydralazine, lisinopril 4. History of hyperlipidemia-continue pravastatin 5. History of diabetes-Accu-Cheks before meals at bedtime with regular insulin sliding scale coverage. Continue Levemir at bedtime.   Diet/Nutrition: Heart healthy, carb controlled Fluids: HL DVT Px: Lovenox, SCDs and early ambulation Code Status: Full  All the records are reviewed and case discussed with ED provider. Management plans discussed with the patient and/or family who express understanding and agree with plan of care.   TOTAL TIME TAKING CARE OF THIS PATIENT: 60 minutes.   Dawn Alvarez D.O. on 04/12/2016 at 1:23 AM Between 7am to 6pm - Pager - [phone] After 6pm go to [email or web address] - Proofreader [facility] [place] [facility] Office [phone] CC: Primary care physician; Lavera Guise, MD     Note: This dictation was prepared with Dragon dictation along with smaller phrase technology. Any transcriptional errors that result from this process are unintentional.

## 2016-04-14 DIAGNOSIS — Z716 Tobacco abuse counseling: Secondary | ICD-10-CM | POA: Diagnosis not present

## 2016-04-14 DIAGNOSIS — F17200 Nicotine dependence, unspecified, uncomplicated: Secondary | ICD-10-CM | POA: Diagnosis not present

## 2016-04-14 DIAGNOSIS — I10 Essential (primary) hypertension: Secondary | ICD-10-CM | POA: Diagnosis not present

## 2016-04-14 DIAGNOSIS — I2510 Atherosclerotic heart disease of native coronary artery without angina pectoris: Secondary | ICD-10-CM | POA: Diagnosis not present

## 2016-04-14 DIAGNOSIS — R0789 Other chest pain: Secondary | ICD-10-CM | POA: Diagnosis not present

## 2016-04-14 DIAGNOSIS — R0602 Shortness of breath: Secondary | ICD-10-CM | POA: Diagnosis not present

## 2016-04-14 DIAGNOSIS — R55 Syncope and collapse: Secondary | ICD-10-CM | POA: Diagnosis not present

## 2016-04-14 DIAGNOSIS — E782 Mixed hyperlipidemia: Secondary | ICD-10-CM | POA: Diagnosis not present

## 2016-04-15 DIAGNOSIS — E1165 Type 2 diabetes mellitus with hyperglycemia: Secondary | ICD-10-CM | POA: Diagnosis not present

## 2016-04-15 DIAGNOSIS — I10 Essential (primary) hypertension: Secondary | ICD-10-CM | POA: Diagnosis not present

## 2016-04-15 DIAGNOSIS — J209 Acute bronchitis, unspecified: Secondary | ICD-10-CM | POA: Diagnosis not present

## 2016-04-15 DIAGNOSIS — E782 Mixed hyperlipidemia: Secondary | ICD-10-CM | POA: Diagnosis not present

## 2016-04-15 DIAGNOSIS — F17211 Nicotine dependence, cigarettes, in remission: Secondary | ICD-10-CM | POA: Diagnosis not present

## 2016-04-16 DIAGNOSIS — E782 Mixed hyperlipidemia: Secondary | ICD-10-CM | POA: Diagnosis not present

## 2016-04-16 DIAGNOSIS — E1165 Type 2 diabetes mellitus with hyperglycemia: Secondary | ICD-10-CM | POA: Diagnosis not present

## 2016-04-20 ENCOUNTER — Encounter: Payer: Self-pay | Admitting: Emergency Medicine

## 2016-04-20 ENCOUNTER — Emergency Department: Payer: Medicare Other

## 2016-04-20 ENCOUNTER — Emergency Department
Admission: EM | Admit: 2016-04-20 | Discharge: 2016-04-20 | Disposition: A | Discharge status: Home / Self Care | Payer: Medicare Other | Attending: Emergency Medicine | Admitting: Emergency Medicine

## 2016-04-20 DIAGNOSIS — J449 Chronic obstructive pulmonary disease, unspecified: Secondary | ICD-10-CM | POA: Insufficient documentation

## 2016-04-20 DIAGNOSIS — R109 Unspecified abdominal pain: Secondary | ICD-10-CM | POA: Diagnosis not present

## 2016-04-20 DIAGNOSIS — R079 Chest pain, unspecified: Secondary | ICD-10-CM

## 2016-04-20 DIAGNOSIS — Z794 Long term (current) use of insulin: Secondary | ICD-10-CM | POA: Diagnosis not present

## 2016-04-20 DIAGNOSIS — G9389 Other specified disorders of brain: Secondary | ICD-10-CM | POA: Diagnosis not present

## 2016-04-20 DIAGNOSIS — E119 Type 2 diabetes mellitus without complications: Secondary | ICD-10-CM | POA: Insufficient documentation

## 2016-04-20 DIAGNOSIS — Z79899 Other long term (current) drug therapy: Secondary | ICD-10-CM | POA: Insufficient documentation

## 2016-04-20 DIAGNOSIS — R0789 Other chest pain: Secondary | ICD-10-CM | POA: Diagnosis not present

## 2016-04-20 DIAGNOSIS — J45909 Unspecified asthma, uncomplicated: Secondary | ICD-10-CM | POA: Insufficient documentation

## 2016-04-20 DIAGNOSIS — R1013 Epigastric pain: Secondary | ICD-10-CM | POA: Insufficient documentation

## 2016-04-20 DIAGNOSIS — M545 Low back pain, unspecified: Secondary | ICD-10-CM

## 2016-04-20 DIAGNOSIS — F17210 Nicotine dependence, cigarettes, uncomplicated: Secondary | ICD-10-CM | POA: Insufficient documentation

## 2016-04-20 DIAGNOSIS — I10 Essential (primary) hypertension: Secondary | ICD-10-CM | POA: Insufficient documentation

## 2016-04-20 DIAGNOSIS — R4182 Altered mental status, unspecified: Secondary | ICD-10-CM | POA: Diagnosis not present

## 2016-04-20 LAB — CBC WITH DIFFERENTIAL/PLATELET
BAND NEUTROPHILS: 0 %
BASOS ABS: 0 10*3/uL (ref 0–0.1)
BASOS PCT: 0 %
Blasts: 0 %
EOS ABS: 0 10*3/uL (ref 0–0.7)
EOS PCT: 0 %
HEMATOCRIT: 39.3 % (ref 35.0–47.0)
Hemoglobin: 13.7 g/dL (ref 12.0–16.0)
LYMPHS ABS: 4.7 10*3/uL — AB (ref 1.0–3.6)
Lymphocytes Relative: 38 %
MCH: 29.7 pg (ref 26.0–34.0)
MCHC: 34.8 g/dL (ref 32.0–36.0)
MCV: 85.1 fL (ref 80.0–100.0)
METAMYELOCYTES PCT: 0 %
MONOS PCT: 6 %
MYELOCYTES: 0 %
Monocytes Absolute: 0.7 10*3/uL (ref 0.2–0.9)
NEUTROS ABS: 7 10*3/uL — AB (ref 1.4–6.5)
Neutrophils Relative %: 56 %
Other: 0 %
PLATELETS: 258 10*3/uL (ref 150–440)
Promyelocytes Absolute: 0 %
RBC: 4.61 MIL/uL (ref 3.80–5.20)
RDW: 14.1 % (ref 11.5–14.5)
WBC: 12.4 10*3/uL — ABNORMAL HIGH (ref 3.6–11.0)
nRBC: 0 /100 WBC

## 2016-04-20 LAB — COMPREHENSIVE METABOLIC PANEL
ALBUMIN: 4.3 g/dL (ref 3.5–5.0)
ALT: 18 U/L (ref 14–54)
ANION GAP: 10 (ref 5–15)
AST: 24 U/L (ref 15–41)
Alkaline Phosphatase: 32 U/L — ABNORMAL LOW (ref 38–126)
BILIRUBIN TOTAL: 0.6 mg/dL (ref 0.3–1.2)
BUN: 20 mg/dL (ref 6–20)
CHLORIDE: 106 mmol/L (ref 101–111)
CO2: 22 mmol/L (ref 22–32)
Calcium: 9.9 mg/dL (ref 8.9–10.3)
Creatinine, Ser: 1.16 mg/dL — ABNORMAL HIGH (ref 0.44–1.00)
GFR calc Af Amer: 58 mL/min — ABNORMAL LOW (ref >60)
GFR calc non Af Amer: 50 mL/min — ABNORMAL LOW (ref >60)
GLUCOSE: 153 mg/dL — AB (ref 65–99)
POTASSIUM: 4.3 mmol/L (ref 3.5–5.1)
Sodium: 138 mmol/L (ref 135–145)
TOTAL PROTEIN: 8.8 g/dL — AB (ref 6.5–8.1)

## 2016-04-20 LAB — URINALYSIS COMPLETE WITH MICROSCOPIC (ARMC ONLY)
Bilirubin Urine: NEGATIVE
Glucose, UA: NEGATIVE mg/dL
Hgb urine dipstick: NEGATIVE
Ketones, ur: NEGATIVE mg/dL
Leukocytes, UA: NEGATIVE
Nitrite: NEGATIVE
PROTEIN: NEGATIVE mg/dL
Specific Gravity, Urine: 1.023 (ref 1.005–1.030)
pH: 5 (ref 5.0–8.0)

## 2016-04-20 LAB — ETHANOL

## 2016-04-20 LAB — URINE DRUG SCREEN, QUALITATIVE (ARMC ONLY)
AMPHETAMINES, UR SCREEN: NOT DETECTED
Barbiturates, Ur Screen: NOT DETECTED
Benzodiazepine, Ur Scrn: NOT DETECTED
Cannabinoid 50 Ng, Ur ~~LOC~~: NOT DETECTED
Cocaine Metabolite,Ur ~~LOC~~: NOT DETECTED
MDMA (ECSTASY) UR SCREEN: NOT DETECTED
METHADONE SCREEN, URINE: NOT DETECTED
Opiate, Ur Screen: NOT DETECTED
Phencyclidine (PCP) Ur S: NOT DETECTED
TRICYCLIC, UR SCREEN: NOT DETECTED

## 2016-04-20 LAB — TSH: TSH: 1.081 u[IU]/mL (ref 0.350–4.500)

## 2016-04-20 LAB — TROPONIN I: Troponin I: <0.03 ng/mL (ref <0.03)

## 2016-04-20 LAB — LIPASE, BLOOD: LIPASE: 26 U/L (ref 11–51)

## 2016-04-20 LAB — LACTIC ACID, PLASMA: Lactic Acid, Venous: 1.1 mmol/L (ref 0.5–1.9)

## 2016-04-20 MED ORDER — IOPAMIDOL (ISOVUE-370) INJECTION 76%
100.0000 mL | Freq: Once | INTRAVENOUS | Status: AC | PRN
  Administered 2016-04-20: 100 mL via INTRAVENOUS

## 2016-04-20 MED ORDER — ALBUTEROL SULFATE (2.5 MG/3ML) 0.083% IN NEBU
5.0000 mg | INHALATION_SOLUTION | Freq: Once | RESPIRATORY_TRACT | Status: AC
  Administered 2016-04-20: 5 mg via RESPIRATORY_TRACT
  Filled 2016-04-20 (×2): qty 6

## 2016-04-20 MED ORDER — IPRATROPIUM-ALBUTEROL 0.5-2.5 (3) MG/3ML IN SOLN
3.0000 mL | Freq: Once | RESPIRATORY_TRACT | Status: AC
  Administered 2016-04-20: 3 mL via RESPIRATORY_TRACT
  Filled 2016-04-20 (×2): qty 3

## 2016-04-20 NOTE — ED Notes (Signed)
Returned from CT.

## 2016-04-20 NOTE — ED Notes (Signed)
Informed patient that a urine sample is needed. Pt states he is unable to go at this time.  Urine cup placed in bathroom.

## 2016-04-20 NOTE — ED Provider Notes (Signed)
[facility] Emergency Department Provider Note  Time seen: 3:40 PM  I have reviewed the triage vital signs and the nursing notes.   HISTORY  Chief Complaint Back Pain    HPI Dawn Alvarez is a 60 y.o. female with a past medical history of asthma, COPD, diabetes, hypertension, hyperlipidemia who presents to the emergency department with back pain and chest pain. Patient was initially sent to the flex area of the emergency department however as the patient appears very somnolent and had trouble answering questions she was moved to the main side of the emergency department for evaluation. Here the patient is awake alert oriented, she is somewhat somnolent at times but awakens to voice and answer questions appropriately. Patient states beginning around 11:00 this morning she has been experiencing central chest pressure and pain in her mid to lower back. Denies any history of back pain in the past. Denies ever having chest pain in the past. Patient is somewhat somnolent, states she may have taken pain medication but does not know.Denies alcohol or drug use. No acute distress.  Past Medical History:  Diagnosis Date  . Asthma   . COPD exacerbation ([place]) 04/12/2016  . Diabetes mellitus without complication ([place])   . Hyperlipemia   . Hypertension     Patient Active Problem List   Diagnosis Date Noted  . Syncope 04/12/2016  . Dizziness and giddiness 04/12/2016  . Acute bronchitis 04/12/2016  . COPD exacerbation ([place]) 04/12/2016  . Tobacco abuse counseling 04/12/2016  . Atypical chest pain 04/11/2016  . Special screening for malignant neoplasms, colon   . Benign neoplasm of sigmoid colon     Past Surgical History:  Procedure Laterality Date  . CESAREAN SECTION    . COLONOSCOPY WITH PROPOFOL N/A 09/11/2015   Procedure: COLONOSCOPY WITH PROPOFOL;  Surgeon: Lucilla Lame, MD;  Location: [facility] ENDOSCOPY;  Service: Endoscopy;  Laterality: N/A;    Prior to Admission  medications   Medication Sig Start Date End Date Taking? Authorizing Provider  albuterol (PROVENTIL HFA;VENTOLIN HFA) 108 (90 Base) MCG/ACT inhaler Inhale 2 puffs into the lungs every 4 (four) hours as needed for wheezing or shortness of breath. 04/12/16   Theodoro Grist, MD  amLODipine (NORVASC) 10 MG tablet Take 10 mg by mouth daily.    Historical Provider, MD  azithromycin (ZITHROMAX) 500 MG tablet Take one pill daily, thank you 04/12/16   Theodoro Grist, MD  bisoprolol-hydrochlorothiazide (ZIAC) 2.5-6.25 MG per tablet Take 1 tablet by mouth daily.    Historical Provider, MD  Cholecalciferol (VITAMIN D3) 2000 units TABS Take 1 tablet by mouth daily.    Historical Provider, MD  gemfibrozil (LOPID) 600 MG tablet Take 1 tablet (600 mg total) by mouth 2 (two) times daily before a meal. 04/12/16   Theodoro Grist, MD  glyBURIDE (DIABETA) 5 MG tablet Take 5 mg by mouth 2 (two) times daily with a meal.    Historical Provider, MD  hydrALAZINE (APRESOLINE) 50 MG tablet Take 50 mg by mouth 3 (three) times daily. Pt takes with a 25mg  tablet.    Historical Provider, MD  insulin detemir (LEVEMIR) 100 UNIT/ML injection Inject 18 Units into the skin at bedtime.    Historical Provider, MD  isosorbide mononitrate (IMDUR) 30 MG 24 hr tablet Take 1 tablet (30 mg total) by mouth daily. 04/12/16   Theodoro Grist, MD  lisinopril (PRINIVIL,ZESTRIL) 20 MG tablet Take 20 mg by mouth daily.    Historical Provider, MD  metFORMIN (GLUCOPHAGE) 500 MG tablet  Take 1,000 mg by mouth 2 (two) times daily with a meal.    Historical Provider, MD  omega-3 acid ethyl esters (LOVAZA) 1 g capsule Take 1 capsule (1 g total) by mouth 2 (two) times daily. 04/12/16   Theodoro Grist, MD  pravastatin (PRAVACHOL) 40 MG tablet Take 40 mg by mouth at bedtime.    Historical Provider, MD    No Known Allergies  Family History  Problem Relation Age of Onset  . Diabetes Mother   . Hypertension Mother   . Hypertension Father   . Diabetes Father      Social History Social History  Substance Use Topics  . Smoking status: Current Every Day Smoker    Packs/day: 1.00    Years: 2.00    Types: Cigarettes  . Smokeless tobacco: Never Used  . Alcohol use 1.2 oz/week    2 Cans of beer per week    Review of Systems Constitutional: Negative for fever. Cardiovascular: Mild to moderate central chest pressure. Respiratory: Negative for shortness of breath. Gastrointestinal: Negative for abdominal pain, vomiting and diarrhea. Musculoskeletal: Mid to lower back pain Neurological: Negative for headache 10-point ROS otherwise negative.  ____________________________________________   PHYSICAL EXAM:  VITAL SIGNS: ED Triage Vitals  Enc Vitals Group     BP 04/20/16 1319 (!) 137/56     Pulse Rate 04/20/16 1319 98     Resp 04/20/16 1319 18     Temp 04/20/16 1319 97.7 F (36.5 C)     Temp Source 04/20/16 1319 Oral     SpO2 04/20/16 1319 96 %     Weight 04/20/16 1319 170 lb (77.1 kg)     Height 04/20/16 1319 5' (1.524 m)     Head Circumference --      Peak Flow --      Pain Score 04/20/16 1323 10     Pain Loc --      Pain Edu? --      Excl. in [place]? --     Constitutional: Alert and oriented. Well appearing and in no distress. Eyes: Normal exam ENT   Head: Normocephalic and atraumatic   Mouth/Throat: Mucous membranes are moist. Cardiovascular: Normal rate, regular rhythm. No murmur Respiratory: Normal respiratory effort without tachypnea nor retractions. Breath sounds are clear  Gastrointestinal: Soft and soft, mild epigastric tenderness palpation. No rebound or guarding. No distention. Musculoskeletal: Nontender with normal range of motion in all extremities. No lower extremity tenderness or edema. Neurologic:  Patient has normal speech and language. Patient does appear somnolent Skin:  Skin is warm, dry and intact.  Psychiatric: Mood and affect are normal. Speech and behavior are normal.    ____________________________________________    EKG  EKG reviewed and interpreted by myself shows normal sinus rhythm at 89 bpm, narrow QRS, normal axis, normal intervals, no concerning ST changes.  ____________________________________________    RADIOLOGY  Chest x-ray negative. CT head is negative. CT angiography negative  ____________________________________________   INITIAL IMPRESSION / ASSESSMENT AND PLAN / ED COURSE  Pertinent labs & imaging results that were available during my care of the patient were reviewed by me and considered in my medical decision making (see chart for details).  Patient presents the emergency department with chest pain and mid to lower back discomfort starting around 11:00 today. On exam the patient is somnolent, awakens to voice and answers questions however she is somnolent. When asked why she is somewhat she states she may be given pain medications morning but she does not  remember. Denies alcohol or drug use. We will check labs, CT head, chest x-ray, EKG and closely monitor.  CT head and chest x-ray within normal limits.   Patient's workup has resulted in largely normal results. CTs are negative. Urinalysis normal. Urine toxicology normal. Patient is now awake alert, oriented, asking for something to eat. Patient was able to eat crackers and peanut butter and drink a drink with no difficulty. We will discharge the patient home at this time. She has a cardiologist that she will follow up with Monday. I discussed very strict return precautions for any worsening pain, trouble breathing, patient is agreeable.  ____________________________________________   FINAL CLINICAL IMPRESSION(S) / ED DIAGNOSES  Chest pain Back pain    Harvest Dark, MD 04/20/16 1824

## 2016-04-20 NOTE — ED Triage Notes (Signed)
Pt states back pain today - upper back pain

## 2016-04-20 NOTE — ED Notes (Signed)
Patient c/o pain between shoulder blades. Pain started when patient got out of church. Patient appears lethargic, able to answer RN questions but it takes patient several minutes to respond. Patient denies taking any pain medication.

## 2016-04-20 NOTE — ED Notes (Signed)
Informed patient the need for urine sample at this time. Pt unable to go, will attempt to try again soon if unable to go will do in and out catheter.

## 2016-04-20 NOTE — Discharge Instructions (Signed)
You have been seen in the emergency department today for chest pain. Your workup has shown normal results. As we discussed please follow-up with your primary care physician in the next 1-2 days for recheck. Return to the emergency department for any further chest pain, trouble breathing, or any other symptom personally concerning to yourself. °

## 2016-04-23 DIAGNOSIS — R079 Chest pain, unspecified: Secondary | ICD-10-CM | POA: Diagnosis not present

## 2016-04-28 DIAGNOSIS — R55 Syncope and collapse: Secondary | ICD-10-CM | POA: Diagnosis not present

## 2016-05-01 DIAGNOSIS — F17200 Nicotine dependence, unspecified, uncomplicated: Secondary | ICD-10-CM | POA: Diagnosis not present

## 2016-05-01 DIAGNOSIS — Z716 Tobacco abuse counseling: Secondary | ICD-10-CM | POA: Diagnosis not present

## 2016-05-01 DIAGNOSIS — E782 Mixed hyperlipidemia: Secondary | ICD-10-CM | POA: Diagnosis not present

## 2016-05-01 DIAGNOSIS — I10 Essential (primary) hypertension: Secondary | ICD-10-CM | POA: Diagnosis not present

## 2016-05-01 DIAGNOSIS — I2510 Atherosclerotic heart disease of native coronary artery without angina pectoris: Secondary | ICD-10-CM | POA: Diagnosis not present

## 2016-05-01 DIAGNOSIS — R0789 Other chest pain: Secondary | ICD-10-CM | POA: Diagnosis not present

## 2016-05-01 DIAGNOSIS — R55 Syncope and collapse: Secondary | ICD-10-CM | POA: Diagnosis not present

## 2016-05-01 DIAGNOSIS — R0602 Shortness of breath: Secondary | ICD-10-CM | POA: Diagnosis not present

## 2016-05-06 DIAGNOSIS — R0602 Shortness of breath: Secondary | ICD-10-CM | POA: Diagnosis not present

## 2016-05-06 DIAGNOSIS — I2510 Atherosclerotic heart disease of native coronary artery without angina pectoris: Secondary | ICD-10-CM | POA: Diagnosis not present

## 2016-05-06 DIAGNOSIS — I10 Essential (primary) hypertension: Secondary | ICD-10-CM | POA: Diagnosis not present

## 2016-05-06 DIAGNOSIS — E782 Mixed hyperlipidemia: Secondary | ICD-10-CM | POA: Diagnosis not present

## 2016-05-06 DIAGNOSIS — R55 Syncope and collapse: Secondary | ICD-10-CM | POA: Diagnosis not present

## 2016-07-11 DIAGNOSIS — I10 Essential (primary) hypertension: Secondary | ICD-10-CM | POA: Diagnosis not present

## 2016-07-11 DIAGNOSIS — E1165 Type 2 diabetes mellitus with hyperglycemia: Secondary | ICD-10-CM | POA: Diagnosis not present

## 2016-07-11 DIAGNOSIS — F17210 Nicotine dependence, cigarettes, uncomplicated: Secondary | ICD-10-CM | POA: Diagnosis not present

## 2016-07-11 DIAGNOSIS — K5900 Constipation, unspecified: Secondary | ICD-10-CM | POA: Diagnosis not present

## 2016-07-11 DIAGNOSIS — Z0001 Encounter for general adult medical examination with abnormal findings: Secondary | ICD-10-CM | POA: Diagnosis not present

## 2016-07-11 DIAGNOSIS — R1314 Dysphagia, pharyngoesophageal phase: Secondary | ICD-10-CM | POA: Diagnosis not present

## 2016-07-14 ENCOUNTER — Other Ambulatory Visit: Payer: Self-pay | Admitting: Nurse Practitioner

## 2016-07-14 DIAGNOSIS — R1314 Dysphagia, pharyngoesophageal phase: Secondary | ICD-10-CM

## 2016-07-15 ENCOUNTER — Other Ambulatory Visit: Payer: Self-pay | Admitting: Nurse Practitioner

## 2016-07-15 DIAGNOSIS — R1314 Dysphagia, pharyngoesophageal phase: Secondary | ICD-10-CM

## 2016-08-14 ENCOUNTER — Other Ambulatory Visit: Payer: Self-pay | Admitting: Nurse Practitioner

## 2016-08-14 ENCOUNTER — Ambulatory Visit
Admission: RE | Admit: 2016-08-14 | Discharge: 2016-08-14 | Disposition: A | Discharge status: Home / Self Care | Payer: Medicare Other | Source: Ambulatory Visit | Attending: Nurse Practitioner | Admitting: Nurse Practitioner

## 2016-08-14 DIAGNOSIS — R1314 Dysphagia, pharyngoesophageal phase: Secondary | ICD-10-CM

## 2016-08-14 DIAGNOSIS — Z1231 Encounter for screening mammogram for malignant neoplasm of breast: Secondary | ICD-10-CM | POA: Diagnosis not present

## 2016-08-14 DIAGNOSIS — K219 Gastro-esophageal reflux disease without esophagitis: Secondary | ICD-10-CM | POA: Diagnosis not present

## 2016-08-14 DIAGNOSIS — I700 Atherosclerosis of aorta: Secondary | ICD-10-CM | POA: Insufficient documentation

## 2016-08-14 DIAGNOSIS — K224 Dyskinesia of esophagus: Secondary | ICD-10-CM | POA: Diagnosis not present

## 2016-08-14 DIAGNOSIS — K449 Diaphragmatic hernia without obstruction or gangrene: Secondary | ICD-10-CM | POA: Insufficient documentation

## 2016-11-12 DIAGNOSIS — F17210 Nicotine dependence, cigarettes, uncomplicated: Secondary | ICD-10-CM | POA: Diagnosis not present

## 2016-11-12 DIAGNOSIS — E1165 Type 2 diabetes mellitus with hyperglycemia: Secondary | ICD-10-CM | POA: Diagnosis not present

## 2016-11-12 DIAGNOSIS — I700 Atherosclerosis of aorta: Secondary | ICD-10-CM | POA: Diagnosis not present

## 2016-11-12 DIAGNOSIS — I10 Essential (primary) hypertension: Secondary | ICD-10-CM | POA: Diagnosis not present

## 2016-11-12 DIAGNOSIS — R1314 Dysphagia, pharyngoesophageal phase: Secondary | ICD-10-CM | POA: Diagnosis not present

## 2016-11-24 DIAGNOSIS — I700 Atherosclerosis of aorta: Secondary | ICD-10-CM | POA: Diagnosis not present

## 2017-02-10 DIAGNOSIS — R1314 Dysphagia, pharyngoesophageal phase: Secondary | ICD-10-CM | POA: Diagnosis not present

## 2017-02-10 DIAGNOSIS — I10 Essential (primary) hypertension: Secondary | ICD-10-CM | POA: Diagnosis not present

## 2017-02-10 DIAGNOSIS — F17210 Nicotine dependence, cigarettes, uncomplicated: Secondary | ICD-10-CM | POA: Diagnosis not present

## 2017-02-10 DIAGNOSIS — B354 Tinea corporis: Secondary | ICD-10-CM | POA: Diagnosis not present

## 2017-02-10 DIAGNOSIS — E1165 Type 2 diabetes mellitus with hyperglycemia: Secondary | ICD-10-CM | POA: Diagnosis not present

## 2017-06-20 ENCOUNTER — Emergency Department
Admission: EM | Admit: 2017-06-20 | Discharge: 2017-06-20 | Disposition: A | Discharge status: Home / Self Care | Payer: Medicare Other | Attending: Emergency Medicine | Admitting: Emergency Medicine

## 2017-06-20 ENCOUNTER — Other Ambulatory Visit: Payer: Self-pay

## 2017-06-20 ENCOUNTER — Encounter: Payer: Self-pay | Admitting: Emergency Medicine

## 2017-06-20 ENCOUNTER — Emergency Department: Payer: Medicare Other

## 2017-06-20 DIAGNOSIS — R519 Headache, unspecified: Secondary | ICD-10-CM

## 2017-06-20 DIAGNOSIS — Z794 Long term (current) use of insulin: Secondary | ICD-10-CM | POA: Insufficient documentation

## 2017-06-20 DIAGNOSIS — I10 Essential (primary) hypertension: Secondary | ICD-10-CM | POA: Diagnosis not present

## 2017-06-20 DIAGNOSIS — F17210 Nicotine dependence, cigarettes, uncomplicated: Secondary | ICD-10-CM | POA: Diagnosis not present

## 2017-06-20 DIAGNOSIS — R51 Headache: Secondary | ICD-10-CM | POA: Diagnosis not present

## 2017-06-20 DIAGNOSIS — Z79899 Other long term (current) drug therapy: Secondary | ICD-10-CM | POA: Insufficient documentation

## 2017-06-20 DIAGNOSIS — J45909 Unspecified asthma, uncomplicated: Secondary | ICD-10-CM | POA: Diagnosis not present

## 2017-06-20 DIAGNOSIS — E119 Type 2 diabetes mellitus without complications: Secondary | ICD-10-CM | POA: Diagnosis not present

## 2017-06-20 DIAGNOSIS — J449 Chronic obstructive pulmonary disease, unspecified: Secondary | ICD-10-CM | POA: Diagnosis not present

## 2017-06-20 MED ORDER — BUTALBITAL-APAP-CAFFEINE 50-325-40 MG PO TABS: 1.0000 | ORAL_TABLET | Freq: Four times a day (QID) | ORAL | 0 refills | Status: DC | PRN

## 2017-06-20 MED ORDER — BUTALBITAL-APAP-CAFFEINE 50-325-40 MG PO TABS
2.0000 | ORAL_TABLET | Freq: Once | ORAL | Status: AC
  Administered 2017-06-20: 2 via ORAL
  Filled 2017-06-20: qty 2

## 2017-06-20 MED ORDER — BUTALBITAL-APAP-CAFFEINE 50-325-40 MG PO TABS: 1.0000 | ORAL_TABLET | Freq: Four times a day (QID) | ORAL | 0 refills | Status: AC | PRN

## 2017-06-20 NOTE — Discharge Instructions (Signed)
Follow-up with your primary care doctor for recheck of your blood pressure.  Take blood pressure medication daily as directed by your doctor.  Appearance that one every 6 hours as needed for headache.  Increase fluids. Return to the emergency department if any worsening of your symptoms.

## 2017-06-20 NOTE — ED Provider Notes (Signed)
[facility] Emergency Department Provider Note  ____________________________________________   First MD Initiated Contact with Patient 06/20/17 1419     (approximate)  I have reviewed the triage vital signs and the nursing notes.   HISTORY  Chief Complaint Headache  HPI Dawn Alvarez is a 62 y.o. female is here with complaint of headache.  Patient states that she woke with a frontal headache which has improved since she has been in the emergency department.  Patient denies any injury, nausea, vomiting, visual changes with her headache.  She has not taken any over-the-counter medication prior to her arrival.  Patient denies any use of blood thinners.  She states that initially when she arrived in the emergency department her pain was 10/10.  She now states that her headache is a 6/10.  Past Medical History:  Diagnosis Date  . Asthma   . COPD exacerbation ([place]) 04/12/2016  . Diabetes mellitus without complication ([place])   . Hyperlipemia   . Hypertension     Patient Active Problem List   Diagnosis Date Noted  . Syncope 04/12/2016  . Dizziness and giddiness 04/12/2016  . Acute bronchitis 04/12/2016  . COPD exacerbation ([place]) 04/12/2016  . Tobacco abuse counseling 04/12/2016  . Atypical chest pain 04/11/2016  . Special screening for malignant neoplasms, colon   . Benign neoplasm of sigmoid colon     Past Surgical History:  Procedure Laterality Date  . CESAREAN SECTION    . COLONOSCOPY WITH PROPOFOL N/A 09/11/2015   Procedure: COLONOSCOPY WITH PROPOFOL;  Surgeon: Lucilla Lame, MD;  Location: [facility] ENDOSCOPY;  Service: Endoscopy;  Laterality: N/A;    Prior to Admission medications   Medication Sig Start Date End Date Taking? Authorizing Provider  albuterol (PROVENTIL HFA;VENTOLIN HFA) 108 (90 Base) MCG/ACT inhaler Inhale 2 puffs into the lungs every 4 (four) hours as needed for wheezing or shortness of breath. 04/12/16   Theodoro Grist, MD    amLODipine (NORVASC) 10 MG tablet Take 10 mg by mouth daily.    [provider]  bisoprolol-hydrochlorothiazide (ZIAC) 2.5-6.25 MG per tablet Take 1 tablet by mouth daily.    [provider]  butalbital-acetaminophen-caffeine [employer], ESGIC) [phone] MG tablet Take 1 tablet by mouth every 6 (six) hours as needed for headache. 06/20/17 06/20/18  Johnn Hai, PA-C  Cholecalciferol (VITAMIN D3) 2000 units TABS Take 1 tablet by mouth daily.    [provider]  gemfibrozil (LOPID) 600 MG tablet Take 1 tablet (600 mg total) by mouth 2 (two) times daily before a meal. 04/12/16   Theodoro Grist, MD  glyBURIDE (DIABETA) 5 MG tablet Take 5 mg by mouth 2 (two) times daily with a meal.    [provider]  hydrALAZINE (APRESOLINE) 50 MG tablet Take 50 mg by mouth 3 (three) times daily. Pt takes with a 25mg  tablet.    [provider]  insulin detemir (LEVEMIR) 100 UNIT/ML injection Inject 18 Units into the skin at bedtime.    [provider]  isosorbide mononitrate (IMDUR) 30 MG 24 hr tablet Take 1 tablet (30 mg total) by mouth daily. 04/12/16   Theodoro Grist, MD  lisinopril (PRINIVIL,ZESTRIL) 20 MG tablet Take 20 mg by mouth daily.    [provider]  metFORMIN (GLUCOPHAGE) 500 MG tablet Take 1,000 mg by mouth 2 (two) times daily with a meal.    [provider]  omega-3 acid ethyl esters (LOVAZA) 1 g capsule Take 1 capsule (1 g total) by mouth 2 (two)  times daily. 04/12/16   Theodoro Grist, MD  pravastatin (PRAVACHOL) 40 MG tablet Take 40 mg by mouth at bedtime.    [provider]    Allergies Patient has no known allergies.  Family History  Problem Relation Age of Onset  . Diabetes Mother   . Hypertension Mother   . Hypertension Father   . Diabetes Father     Social History Social History   Tobacco Use  . Smoking status: Current Every Day Smoker    Packs/day: 1.00    Years: 2.00    Pack years: 2.00     Types: Cigarettes  . Smokeless tobacco: Never Used  Substance Use Topics  . Alcohol use: Yes    Alcohol/week: 1.2 oz    Types: 2 Cans of beer per week  . Drug use: No    Review of Systems Constitutional: No fever/chills Eyes: No visual changes. ENT: No sore throat.  Negative for nasal congestion. Cardiovascular: Denies chest pain. Respiratory: Denies shortness of breath. Gastrointestinal: No abdominal pain.  No nausea, no vomiting.  Genitourinary: Negative for dysuria. Musculoskeletal: Negative for muscle aches. Skin: Negative for rash. Neurological: Positive for frontal headache.  Negative for focal weakness or numbness. ____________________________________________   PHYSICAL EXAM:  VITAL SIGNS: ED Triage Vitals  Enc Vitals Group     BP 06/20/17 1224 (!) 214/104     Pulse Rate 06/20/17 1224 92     Resp 06/20/17 1224 20     Temp 06/20/17 1224 98.1 F (36.7 C)     Temp Source 06/20/17 1224 Oral     SpO2 06/20/17 1224 96 %     Weight 06/20/17 1226 161 lb (73 kg)     Height 06/20/17 1226 5' (1.524 m)     Head Circumference --      Peak Flow --      Pain Score 06/20/17 1223 10     Pain Loc --      Pain Edu? --      Excl. in [place]? --    Constitutional: Alert and oriented. Well appearing and in no acute distress. Eyes: Conjunctivae are normal. PERRL. EOMI. Head: Atraumatic. Nose: No congestion/rhinnorhea.  TMs are dull bilaterally. Mouth/Throat: Mucous membranes are moist.  Oropharynx non-erythematous. Neck: No stridor.  No cervical tenderness on palpation posteriorly.  There is some tenderness on palpation of the left trapezius muscle.  Pain is increased with range of motion of the neck.  No trauma is noted. Hematological/Lymphatic/Immunilogical: No cervical lymphadenopathy. Cardiovascular: Normal rate, regular rhythm. Grossly normal heart sounds.  Good peripheral circulation. Respiratory: Normal respiratory effort.  No retractions. Lungs CTAB. Gastrointestinal: Soft and  nontender. No distention.  Musculoskeletal: Moves upper and lower extremities without any difficulty.  Normal gait was noted.  Patient has good muscle strength bilaterally.  Patient was ambulatory in the exam room without any assistance. Neurologic:  Normal speech and language. No gross focal neurologic deficits are appreciated.  Cranial nerves II through XII grossly intact.  No gait instability. Skin:  Skin is warm, dry and intact. No rash noted. Psychiatric: Mood and affect are normal. Speech and behavior are normal.  ____________________________________________   LABS (all labs ordered are listed, but only abnormal results are displayed)  Labs Reviewed - No data to display  RADIOLOGY  Ct Head Wo Contrast  Result Date: 06/20/2017 CLINICAL DATA:  Acute severe headache EXAM: CT HEAD WITHOUT CONTRAST TECHNIQUE: Contiguous axial images were obtained from the base of the skull through the vertex without intravenous  contrast. COMPARISON:  04/20/2016 FINDINGS: Brain: Stable chronic white matter microvascular changes throughout both cerebral hemispheres. No acute intracranial hemorrhage, mass lesion, new infarction, midline shift, herniation, hydrocephalus, or extra-axial fluid collection. No focal mass effect or edema. Cisterns are patent. No cerebellar abnormality. Vascular: No hyperdense vessel or unexpected calcification. Skull: Normal. Negative for fracture or focal lesion. Sinuses/Orbits: No acute finding. Other: None. IMPRESSION: Stable chronic white matter microvascular changes bilaterally. No acute intracranial abnormality or interval change by noncontrast CT. Electronically Signed   By: Jerilynn Mages.  Shick M.D.   On: 06/20/2017 13:33    ____________________________________________   PROCEDURES  Procedure(s) performed: None  Procedures  Critical Care performed: No  ____________________________________________   INITIAL IMPRESSION / ASSESSMENT AND PLAN / ED COURSE Patient states that  headache has improved and now is only a 4 out of 10.  Patient was discharged with a prescription for a limited amount of Fioricet 1 every 6 hours as needed for headache.  She is to follow-up with her PCP if any continued problems.  She is encouraged to increase fluids for the remainder of the day.  ____________________________________________   FINAL CLINICAL IMPRESSION(S) / ED DIAGNOSES  Final diagnoses:  Frontal headache     ED Discharge Orders        Ordered    butalbital-acetaminophen-caffeine (FIORICET, ESGIC) 50-325-40 MG tablet  Every 6 hours PRN,   Status:  Discontinued     06/20/17 1550    butalbital-acetaminophen-caffeine (FIORICET, ESGIC) 50-325-40 MG tablet  Every 6 hours PRN     06/20/17 1551       Note:  This document was prepared using Dragon voice recognition software and may include unintentional dictation errors.    Johnn Hai, PA-C 06/20/17 1801    Lisa Roca, MD 06/21/17 1536

## 2017-06-20 NOTE — ED Triage Notes (Addendum)
Headache since this am. Denies head injury. States woke with headache. Denies use of blood thinners. No neuro deficits.

## 2017-06-23 ENCOUNTER — Ambulatory Visit (INDEPENDENT_AMBULATORY_CARE_PROVIDER_SITE_OTHER): Payer: Medicare Other | Admitting: Nurse Practitioner

## 2017-06-23 ENCOUNTER — Encounter: Payer: Self-pay | Admitting: Nurse Practitioner

## 2017-06-23 VITALS — BP 131/79 | HR 62 | Resp 16 | Ht 61.0 in | Wt 177.0 lb

## 2017-06-23 DIAGNOSIS — Z1231 Encounter for screening mammogram for malignant neoplasm of breast: Secondary | ICD-10-CM

## 2017-06-23 DIAGNOSIS — G44209 Tension-type headache, unspecified, not intractable: Secondary | ICD-10-CM

## 2017-06-23 DIAGNOSIS — I10 Essential (primary) hypertension: Secondary | ICD-10-CM | POA: Insufficient documentation

## 2017-06-23 DIAGNOSIS — Z23 Encounter for immunization: Secondary | ICD-10-CM | POA: Diagnosis not present

## 2017-06-23 DIAGNOSIS — E1165 Type 2 diabetes mellitus with hyperglycemia: Secondary | ICD-10-CM

## 2017-06-23 DIAGNOSIS — I152 Hypertension secondary to endocrine disorders: Secondary | ICD-10-CM | POA: Insufficient documentation

## 2017-06-23 DIAGNOSIS — E559 Vitamin D deficiency, unspecified: Secondary | ICD-10-CM

## 2017-06-23 DIAGNOSIS — Z1239 Encounter for other screening for malignant neoplasm of breast: Secondary | ICD-10-CM

## 2017-06-23 DIAGNOSIS — Z794 Long term (current) use of insulin: Secondary | ICD-10-CM | POA: Insufficient documentation

## 2017-06-23 LAB — POCT GLYCOSYLATED HEMOGLOBIN (HGB A1C): Hemoglobin A1C: 10.4

## 2017-06-23 MED ORDER — BISOPROLOL-HYDROCHLOROTHIAZIDE 2.5-6.25 MG PO TABS: 1.0000 | ORAL_TABLET | Freq: Every day | ORAL | 5 refills | Status: DC

## 2017-06-23 MED ORDER — SEMAGLUTIDE(0.25 OR 0.5MG/DOS) 2 MG/1.5ML ~~LOC~~ SOPN: 0.5000 mg | PEN_INJECTOR | SUBCUTANEOUS | 3 refills | Status: DC

## 2017-06-23 MED ORDER — AMLODIPINE BESYLATE 10 MG PO TABS: 10.0000 mg | ORAL_TABLET | Freq: Every day | ORAL | 5 refills | Status: DC

## 2017-06-23 NOTE — Progress Notes (Addendum)
[facility] [place], [place] [address]  Internal MEDICINE  Office Visit Note  Patient Name: Dawn Alvarez  [id number]  [id number]  Date of Service: 06/24/2017  Chief Complaint  Patient presents with  . Headache  . Hypertension  . Diabetes    The patient was recently seen in ER due to very high blood pressure and severe headache. A CT scan was done of her head and was stable. No acute abnormalities were noted. She was given medication to relieve headache. This helped. Blood pressure had also improved prior to her discharge. No medication changes were made.  She continues to have elevated blood sugars. Despite increased levels of long acting insulin and taking metformin 1057m twice daily, blood sugars continue to rise. She states that she often feels tired and a little nauseated.    Other  This is a new problem. The current episode started in the past 7 days. The problem occurs every several days. The problem has been resolved. Associated symptoms include fatigue, headaches and nausea. Pertinent negatives include no arthralgias, chest pain, congestion, coughing, myalgias or vomiting. Associated symptoms comments: Elevated blood pressure. . The symptoms are aggravated by smoking and stress. She has tried acetaminophen, NSAIDs and rest for the symptoms. The treatment provided moderate relief.    Pt is here for routine follow up.    Current Medication: Outpatient Encounter Medications as of 06/23/2017  Medication Sig  . amLODipine (NORVASC) 10 MG tablet Take 1 tablet (10 mg total) by mouth daily.  . bisoprolol-hydrochlorothiazide (ZIAC) 2.5-6.25 MG tablet Take 1 tablet by mouth daily.  . butalbital-acetaminophen-caffeine (FIORICET, ESGIC) 50-325-40 MG tablet Take 1 tablet by mouth every 6 (six) hours as needed for headache.  . glucose blood (ACCU-CHEK SMARTVIEW) test strip 1 each by Other route as needed for other. Use as directed twice a day diag E11.65  .  glyBURIDE (DIABETA) 5 MG tablet Take 5 mg by mouth 2 (two) times daily with a meal.  . hydrALAZINE (APRESOLINE) 50 MG tablet Take 50 mg by mouth 3 (three) times daily. Pt takes with a 24mtablet.  . insulin detemir (LEVEMIR) 100 UNIT/ML injection Inject 35 Units into the skin at bedtime.   . Insulin Pen Needle (PEN NEEDLES) 31G X 8 MM MISC by Does not apply route. Use as directed  . Lancets Misc. (ACCU-CHEK MULTICLIX LANCET DEV) KIT by Does not apply route. Use as directed twice a day diag E11.65  . liraglutide (VICTOZA) 18 MG/3ML SOPN Inject into the skin. Inject 1.8 mg [place] daily  . lisinopril (PRINIVIL,ZESTRIL) 20 MG tablet Take 20 mg by mouth daily.  . metFORMIN (GLUCOPHAGE) 500 MG tablet Take 1,000 mg by mouth 2 (two) times daily with a meal. Take 2 tab bid for diabetes  . omega-3 acid ethyl esters (LOVAZA) 1 g capsule Take 1 capsule (1 g total) by mouth 2 (two) times daily.  . Marland Kitchenmeprazole (PRILOSEC) 40 MG capsule Take 40 mg by mouth daily.  . pravastatin (PRAVACHOL) 40 MG tablet Take 40 mg by mouth at bedtime.  . Marland Kitchenolpidem (AMBIEN) 5 MG tablet Take 5 mg by mouth at bedtime as needed for sleep.  . [DISCONTINUED] amLODipine (NORVASC) 10 MG tablet Take 10 mg by mouth daily.  . [DISCONTINUED] bisoprolol-hydrochlorothiazide (ZIAC) 2.5-6.25 MG per tablet Take 1 tablet by mouth daily.  . Marland Kitchenlbuterol (PROVENTIL HFA;VENTOLIN HFA) 108 (90 Base) MCG/ACT inhaler Inhale 2 puffs into the lungs every 4 (four) hours as needed for wheezing or shortness of  breath. (Patient not taking: Reported on 06/23/2017)  . Cholecalciferol (VITAMIN D3) 2000 units TABS Take 1 tablet by mouth daily.  Marland Kitchen gemfibrozil (LOPID) 600 MG tablet Take 1 tablet (600 mg total) by mouth 2 (two) times daily before a meal. (Patient not taking: Reported on 06/23/2017)  . isosorbide mononitrate (IMDUR) 30 MG 24 hr tablet Take 1 tablet (30 mg total) by mouth daily. (Patient not taking: Reported on 06/23/2017)  . Semaglutide (OZEMPIC) 0.25 or 0.5  MG/DOSE SOPN Inject 0.5 mg into the skin once a week.   No facility-administered encounter medications on file as of 06/23/2017.     Surgical History: Past Surgical History:  Procedure Laterality Date  . CESAREAN SECTION    . COLONOSCOPY WITH PROPOFOL N/A 09/11/2015   Procedure: COLONOSCOPY WITH PROPOFOL;  Surgeon: Lucilla Lame, MD;  Location: [facility] ENDOSCOPY;  Service: Endoscopy;  Laterality: N/A;    Medical History: Past Medical History:  Diagnosis Date  . Asthma   . COPD exacerbation ([place]) 04/12/2016  . Diabetes mellitus without complication ([place])   . Hyperlipemia   . Hypertension     Family History: Family History  Problem Relation Age of Onset  . Diabetes Mother   . Hypertension Mother   . Hypertension Father   . Diabetes Father     Social History   Socioeconomic History  . Marital status: Married    Spouse name: Not on file  . Number of children: Not on file  . Years of education: Not on file  . Highest education level: Not on file  Social Needs  . Financial resource strain: Not on file  . Food insecurity - worry: Not on file  . Food insecurity - inability: Not on file  . Transportation needs - medical: Not on file  . Transportation needs - non-medical: Not on file  Occupational History  . Not on file  Tobacco Use  . Smoking status: Current Every Day Smoker    Packs/day: 1.00    Years: 2.00    Pack years: 2.00    Types: Cigarettes  . Smokeless tobacco: Never Used  Substance and Sexual Activity  . Alcohol use: Yes    Alcohol/week: 1.2 oz    Types: 2 Cans of beer per week  . Drug use: No  . Sexual activity: No    Birth control/protection: Abstinence  Other Topics Concern  . Not on file  Social History Narrative  . Not on file      Review of Systems  Constitutional: Positive for fatigue. Negative for activity change, appetite change and unexpected weight change.  HENT: Negative for congestion, postnasal drip, rhinorrhea and voice change.   Eyes:  Negative.   Respiratory: Negative for cough, choking, shortness of breath and wheezing.   Cardiovascular: Negative for chest pain and palpitations.  Gastrointestinal: Positive for nausea. Negative for vomiting.  Endocrine:       High blood sugars  Musculoskeletal: Negative for arthralgias, back pain and myalgias.  Skin: Negative.   Allergic/Immunologic: Negative.   Neurological: Positive for headaches.  Psychiatric/Behavioral: Negative.     Today's Vitals   06/23/17 1042  BP: 131/79  Pulse: 62  Resp: 16  SpO2: 95%  Weight: 177 lb (80.3 kg)  Height: '5\' 1"'  (1.549 m)    Physical Exam  Constitutional: She is oriented to person, place, and time. She appears well-developed and well-nourished.  HENT:  Head: Normocephalic and atraumatic.  Eyes: EOM are normal. Pupils are equal, round, and reactive to light.  Neck:  Normal range of motion. Neck supple. No JVD present. Carotid bruit is not present. Thyromegaly present.  Cardiovascular: Normal rate, regular rhythm and normal heart sounds.  Pulmonary/Chest: Effort normal and breath sounds normal. She has no wheezes. She exhibits no tenderness.  Abdominal: Soft. Bowel sounds are normal. There is no tenderness.  Musculoskeletal: Normal range of motion.  Neurological: She is alert and oriented to person, place, and time. No cranial nerve deficit.  Skin: Skin is warm and dry.  Psychiatric: She has a normal mood and affect.  Nursing note and vitals reviewed.   Assessment/Plan:   1. Uncontrolled type 2 diabetes mellitus with hyperglycemia (HCC) - POCT HgB A1C. HgbA1c 10.0 today. Added Ozempic 0.32m weekly. Increase to 0.552mwith prescription. A sample was provided. She should continue Levemir 35 units daily and metformin 100083mwice daily. Recheck HgbA1c at her next visit.  - CBC with Differential/Platelet - Comprehensive metabolic panel - TSH - T4, free - Lipid panel - Microalbumin / creatinine urine ratio - Semaglutide (OZEMPIC)  0.25 or 0.5 MG/DOSE SOPN; Inject 0.5 mg into the skin once a week.  Dispense: 3 pen; Refill: 3  2. Essential hypertension Improved. No changes in medications today - CBC with Differential/Platelet - Comprehensive metabolic panel - TSH - T4, free - amLODipine (NORVASC) 10 MG tablet; Take 1 tablet (10 mg total) by mouth daily.  Dispense: 30 tablet; Refill: 5 - bisoprolol-hydrochlorothiazide (ZIAC) 2.5-6.25 MG tablet; Take 1 tablet by mouth daily.  Dispense: 30 tablet; Refill: 5  3. Acute non intractable tension-type headache Resolved. New rx for butalbital/APAP/caff. Given twice daily as needed for headache. Instructed her to take this only when needed.   4. Screening for breast cancer - MM DIGITAL SCREENING BILATERAL; Future  5. Vitamin D deficiency - Vitamin D 1,25 dihydroxy  6. Flu vaccine need - Flu Vaccine MDCK QUAD PF  General Counseling: Trinita verbalizes understanding of the findings of todays visit and agrees with plan of treatment. I have discussed any further diagnostic evaluation that may be needed or ordered today. We also reviewed her medications today. she has been encouraged to call the office with any questions or concerns that should arise related to todays visit.    Diabetes Counseling:  1. Addition of ACE inh/ ARB'S for nephroprotection. 2. Diabetic foot care, prevention of complications.  3.Exercise and lose weight.  4. Diabetic eye examination, 5. Monitor blood sugar closlely. nutrition counseling.  6.Sign and symptoms of hypoglycemia including shaking sweating,confusion and headaches.  This patient was seen by HeaLeretha PolNP- C in Collaboration with Dr FozLavera Guise a part of collaborative care agreement    Orders Placed This Encounter  Procedures  . MM DIGITAL SCREENING BILATERAL  . Flu Vaccine MDCK QUAD PF  . CBC with Differential/Platelet  . Comprehensive metabolic panel  . TSH  . T4, free  . Lipid panel  . Vitamin D 1,25 dihydroxy  .  Microalbumin / creatinine urine ratio  . POCT HgB A1C    Meds ordered this encounter  Medications  . amLODipine (NORVASC) 10 MG tablet    Sig: Take 1 tablet (10 mg total) by mouth daily.    Dispense:  30 tablet    Refill:  5    Order Specific Question:   Supervising Provider    Answer:   KHALavera Guise4[[id number]] bisoprolol-hydrochlorothiazide (ZIAC) 2.5-6.25 MG tablet    Sig: Take 1 tablet by mouth daily.    Dispense:  30 tablet  Refill:  5    Order Specific Question:   Supervising Provider    Answer:   Lavera Guise [[id number]]  . Semaglutide (OZEMPIC) 0.25 or 0.5 MG/DOSE SOPN    Sig: Inject 0.5 mg into the skin once a week.    Dispense:  3 pen    Refill:  3    Order Specific Question:   Supervising Provider    Answer:   Lavera Guise [[id number]]    Time spent: 87 Minutes          Dr Lavera Guise Internal medicine

## 2017-06-24 DIAGNOSIS — R519 Headache, unspecified: Secondary | ICD-10-CM | POA: Insufficient documentation

## 2017-06-24 DIAGNOSIS — R51 Headache: Secondary | ICD-10-CM

## 2017-08-17 ENCOUNTER — Ambulatory Visit
Admission: RE | Admit: 2017-08-17 | Discharge: 2017-08-17 | Disposition: A | Discharge status: Home / Self Care | Payer: Medicare Other | Source: Ambulatory Visit | Attending: Nurse Practitioner | Admitting: Nurse Practitioner

## 2017-08-17 DIAGNOSIS — Z1239 Encounter for other screening for malignant neoplasm of breast: Secondary | ICD-10-CM

## 2017-08-17 DIAGNOSIS — Z1231 Encounter for screening mammogram for malignant neoplasm of breast: Secondary | ICD-10-CM | POA: Insufficient documentation

## 2017-08-27 ENCOUNTER — Other Ambulatory Visit: Payer: Self-pay | Admitting: Nurse Practitioner

## 2017-08-27 DIAGNOSIS — N6489 Other specified disorders of breast: Secondary | ICD-10-CM

## 2017-08-27 DIAGNOSIS — R928 Other abnormal and inconclusive findings on diagnostic imaging of breast: Secondary | ICD-10-CM

## 2017-08-30 DIAGNOSIS — M25512 Pain in left shoulder: Secondary | ICD-10-CM | POA: Diagnosis not present

## 2017-08-30 DIAGNOSIS — M19012 Primary osteoarthritis, left shoulder: Secondary | ICD-10-CM | POA: Diagnosis not present

## 2017-08-30 DIAGNOSIS — M542 Cervicalgia: Secondary | ICD-10-CM | POA: Diagnosis not present

## 2017-09-22 ENCOUNTER — Ambulatory Visit (INDEPENDENT_AMBULATORY_CARE_PROVIDER_SITE_OTHER): Payer: Medicare Other | Admitting: Nurse Practitioner

## 2017-09-22 ENCOUNTER — Encounter: Payer: Self-pay | Admitting: Nurse Practitioner

## 2017-09-22 VITALS — BP 180/90 | HR 88 | Resp 16 | Ht 60.0 in | Wt 175.6 lb

## 2017-09-22 DIAGNOSIS — R300 Dysuria: Secondary | ICD-10-CM

## 2017-09-22 DIAGNOSIS — Z124 Encounter for screening for malignant neoplasm of cervix: Secondary | ICD-10-CM

## 2017-09-22 DIAGNOSIS — I10 Essential (primary) hypertension: Secondary | ICD-10-CM

## 2017-09-22 DIAGNOSIS — Z0001 Encounter for general adult medical examination with abnormal findings: Secondary | ICD-10-CM

## 2017-09-22 DIAGNOSIS — E1165 Type 2 diabetes mellitus with hyperglycemia: Secondary | ICD-10-CM | POA: Diagnosis not present

## 2017-09-22 LAB — POCT GLYCOSYLATED HEMOGLOBIN (HGB A1C): HEMOGLOBIN A1C: 8.6

## 2017-09-22 MED ORDER — BISOPROLOL-HYDROCHLOROTHIAZIDE 5-6.25 MG PO TABS: 1.0000 | ORAL_TABLET | Freq: Every day | ORAL | 3 refills | Status: DC

## 2017-09-22 NOTE — Progress Notes (Signed)
[facility] [place], [place] [address]  Internal MEDICINE  Office Visit Note  Patient Name: Dawn Alvarez  [id number]  [id number]  Date of Service: 10/11/2017  Chief Complaint  Patient presents with  . Hypertension  . Diabetes     Hypertension  This is a chronic problem. The current episode started more than 1 year ago. The problem is unchanged. The problem is resistant. Associated symptoms include headaches and malaise/fatigue. Pertinent negatives include no chest pain, palpitations or shortness of breath. There are no associated agents to hypertension. Risk factors for coronary artery disease include diabetes mellitus, dyslipidemia, obesity and post-menopausal state. Past treatments include beta blockers, calcium channel blockers, diuretics and ACE inhibitors. Compliance problems include psychosocial issues.  Hypertensive end-organ damage includes kidney disease.  Diabetes  She presents for her follow-up diabetic visit. She has type 2 diabetes mellitus. No MedicAlert identification noted. Her disease course has been worsening. Hypoglycemia symptoms include headaches and nervousness/anxiousness. Pertinent negatives for hypoglycemia include no dizziness. Associated symptoms include fatigue. Pertinent negatives for diabetes include no chest pain. There are no hypoglycemic complications. Symptoms are stable. Diabetic complications include nephropathy. Risk factors for coronary artery disease include diabetes mellitus, dyslipidemia, hypertension and tobacco exposure. Current diabetic treatment includes insulin injections and oral agent (dual therapy). She is compliant with treatment most of the time. Her weight is stable. She is following a generally healthy diet. When asked about meal planning, she reported none. She has not had a previous visit with a dietitian. She rarely participates in exercise. Her home blood glucose trend is fluctuating minimally. An ACE  inhibitor/angiotensin II receptor blocker is being taken. She does not see a podiatrist.Eye exam is current.   Pt is here for routine health maintenance examination  Current Medication: Outpatient Encounter Medications as of 09/22/2017  Medication Sig  . albuterol (PROVENTIL HFA;VENTOLIN HFA) 108 (90 Base) MCG/ACT inhaler Inhale 2 puffs into the lungs every 4 (four) hours as needed for wheezing or shortness of breath. (Patient not taking: Reported on 06/23/2017)  . amLODipine (NORVASC) 10 MG tablet Take 1 tablet (10 mg total) by mouth daily.  . bisoprolol-hydrochlorothiazide (ZIAC) 5-6.25 MG tablet Take 1 tablet by mouth daily.  . butalbital-acetaminophen-caffeine (FIORICET, ESGIC) 50-325-40 MG tablet Take 1 tablet by mouth every 6 (six) hours as needed for headache.  . Cholecalciferol (VITAMIN D3) 2000 units TABS Take 1 tablet by mouth daily.  Marland Kitchen gemfibrozil (LOPID) 600 MG tablet Take 1 tablet (600 mg total) by mouth 2 (two) times daily before a meal. (Patient not taking: Reported on 06/23/2017)  . glucose blood (ACCU-CHEK SMARTVIEW) test strip 1 each by Other route as needed for other. Use as directed twice a day diag E11.65  . glyBURIDE (DIABETA) 5 MG tablet Take 5 mg by mouth 2 (two) times daily with a meal.  . hydrALAZINE (APRESOLINE) 50 MG tablet Take 50 mg by mouth 3 (three) times daily. Pt takes with a 81m tablet.  . insulin detemir (LEVEMIR) 100 UNIT/ML injection Inject 35 Units into the skin at bedtime.   . Insulin Pen Needle (PEN NEEDLES) 31G X 8 MM MISC by Does not apply route. Use as directed  . isosorbide mononitrate (IMDUR) 30 MG 24 hr tablet Take 1 tablet (30 mg total) by mouth daily. (Patient not taking: Reported on 06/23/2017)  . Lancets Misc. (ACCU-CHEK MULTICLIX LANCET DEV) KIT by Does not apply route. Use as directed twice a day diag E11.65  . lisinopril (PRINIVIL,ZESTRIL) 20 MG tablet Take  20 mg by mouth daily.  . metFORMIN (GLUCOPHAGE) 500 MG tablet Take 1,000 mg by mouth 2  (two) times daily with a meal. Take 2 tab bid for diabetes  . omega-3 acid ethyl esters (LOVAZA) 1 g capsule Take 1 capsule (1 g total) by mouth 2 (two) times daily.  Marland Kitchen omeprazole (PRILOSEC) 40 MG capsule Take 40 mg by mouth daily.  . pravastatin (PRAVACHOL) 40 MG tablet Take 40 mg by mouth at bedtime.  . Semaglutide (OZEMPIC) 0.25 or 0.5 MG/DOSE SOPN Inject 0.5 mg into the skin once a week.  . zolpidem (AMBIEN) 5 MG tablet Take 5 mg by mouth at bedtime as needed for sleep.  . [DISCONTINUED] bisoprolol-hydrochlorothiazide (ZIAC) 2.5-6.25 MG tablet Take 1 tablet by mouth daily.   No facility-administered encounter medications on file as of 09/22/2017.     Surgical History: Past Surgical History:  Procedure Laterality Date  . CESAREAN SECTION    . COLONOSCOPY WITH PROPOFOL N/A 09/11/2015   Procedure: COLONOSCOPY WITH PROPOFOL;  Surgeon: Lucilla Lame, MD;  Location: [facility] ENDOSCOPY;  Service: Endoscopy;  Laterality: N/A;    Medical History: Past Medical History:  Diagnosis Date  . Asthma   . COPD exacerbation ([place]) 04/12/2016  . Diabetes mellitus without complication ([place])   . Hyperlipemia   . Hypertension     Family History: Family History  Problem Relation Age of Onset  . Diabetes Mother   . Hypertension Mother   . Hypertension Father   . Diabetes Father       Review of Systems  Constitutional: Positive for fatigue and malaise/fatigue. Negative for activity change, appetite change and unexpected weight change.  HENT: Negative for congestion, postnasal drip, rhinorrhea, sinus pressure, sinus pain, sore throat and voice change.   Eyes: Negative.   Respiratory: Negative for cough, choking, shortness of breath and wheezing.   Cardiovascular: Negative for chest pain and palpitations.       Blood pressure elevated.   Gastrointestinal: Positive for nausea. Negative for constipation, diarrhea and vomiting.  Endocrine:       High blood sugars  Genitourinary: Negative for  difficulty urinating, dysuria, flank pain, frequency and urgency.  Musculoskeletal: Negative for arthralgias, back pain and myalgias.  Skin: Negative for rash.  Allergic/Immunologic: Positive for environmental allergies.  Neurological: Positive for headaches. Negative for dizziness and light-headedness.  Hematological: Negative for adenopathy. Does not bruise/bleed easily.  Psychiatric/Behavioral: Positive for dysphoric mood. The patient is nervous/anxious.     Today's Vitals   09/22/17 1042  BP: (!) 180/90  Pulse: 88  Resp: 16  SpO2: 96%  Weight: 175 lb 9.6 oz (79.7 kg)  Height: 5' (1.524 m)    Physical Exam  Constitutional: She is oriented to person, place, and time. She appears well-developed and well-nourished.  HENT:  Head: Normocephalic and atraumatic.  Nose: Nose normal.  Eyes: Pupils are equal, round, and reactive to light. Conjunctivae and EOM are normal.  Neck: Normal range of motion. Neck supple. No JVD present. Carotid bruit is not present. Thyromegaly present.  Cardiovascular: Normal rate, regular rhythm, normal heart sounds and intact distal pulses.  Pulmonary/Chest: Effort normal and breath sounds normal. No respiratory distress. She has no wheezes. She exhibits no tenderness. Right breast exhibits no inverted nipple, no mass, no nipple discharge, no skin change and no tenderness. Left breast exhibits no inverted nipple, no mass, no nipple discharge, no skin change and no tenderness. No breast swelling, tenderness, discharge or bleeding. Breasts are symmetrical.  Abdominal: Soft. Bowel sounds  are normal. There is no tenderness.  Genitourinary: Vagina normal and uterus normal.  Genitourinary Comments: No tenderness, masses, or organomegaly present during bimanual exam.   Musculoskeletal: Normal range of motion.  Lymphadenopathy:    She has no cervical adenopathy.  Neurological: She is alert and oriented to person, place, and time. She displays normal reflexes. No  cranial nerve deficit.  Skin: Skin is warm and dry. Capillary refill takes 2 to 3 seconds.  Psychiatric: She has a normal mood and affect. Her behavior is normal. Judgment and thought content normal.  Nursing note and vitals reviewed.    LABS: Recent Results (from the past 2160 hour(s))  POCT HgB A1C     Status: None   Collection Time: 09/22/17 10:55 AM  Result Value Ref Range   Hemoglobin A1C 8.6   Pap IG and HPV (high risk) DNA detection     Status: None   Collection Time: 09/22/17  1:07 PM  Result Value Ref Range   DIAGNOSIS: Comment     Comment: NEGATIVE FOR INTRAEPITHELIAL LESION OR MALIGNANCY.   Specimen adequacy: Comment     Comment: Satisfactory for evaluation. No endocervical component is identified.   Clinician Provided ICD10 Comment     Comment: Z12.4   Performed by: Comment     Comment: Letta Median, Cytotechnologist (ASCP)   PAP Smear Comment .    Note: Comment     Comment: The Pap smear is a screening test designed to aid in the detection of premalignant and malignant conditions of the uterine cervix.  It is not a diagnostic procedure and should not be used as the sole means of detecting cervical cancer.  Both false-positive and false-negative reports do occur.    Test Methodology CANCELED     Comment: The Thin Prep(R) Imager was unable to read this specimen.  Therefore a manual review was performed.  Result canceled by the ancillary.    HPV, high-risk Negative Negative    Comment: This high-risk HPV test detects thirteen high-risk types (16/18/31/33/35/39/45/51/52/56/58/59/68) without differentiation.   CBC with Differential/Platelet     Status: None   Collection Time: 10/05/17  9:38 AM  Result Value Ref Range   WBC 6.5 3.4 - 10.8 x10E3/uL   RBC 4.54 3.77 - 5.28 x10E6/uL   Hemoglobin 12.7 11.1 - 15.9 g/dL   Hematocrit 39.1 34.0 - 46.6 %   MCV 86 79 - 97 fL   MCH 28.0 26.6 - 33.0 pg   MCHC 32.5 31.5 - 35.7 g/dL   RDW 15.3 12.3 - 15.4 %   Platelets  229 150 - 379 x10E3/uL    Comment:                **Effective Oct 19, 2017 the reference interval for**                  Platelets will be changing to:                                0 - 7 d            140 - 396 x10E3/uL                                8 - 30 d           139 - 531 x10E3/uL  31 d - 999 yrs      150 - 450 x10E3/uL    Neutrophils 53 Not Estab. %   Lymphs 37 Not Estab. %   Monocytes 6 Not Estab. %   Eos 3 Not Estab. %   Basos 1 Not Estab. %   Neutrophils Absolute 3.4 1.4 - 7.0 x10E3/uL   Lymphocytes Absolute 2.4 0.7 - 3.1 x10E3/uL   Monocytes Absolute 0.4 0.1 - 0.9 x10E3/uL   EOS (ABSOLUTE) 0.2 0.0 - 0.4 x10E3/uL   Basophils Absolute 0.1 0.0 - 0.2 x10E3/uL   Immature Granulocytes 0 Not Estab. %   Immature Grans (Abs) 0.0 0.0 - 0.1 x10E3/uL  Comprehensive metabolic panel     Status: Abnormal   Collection Time: 10/05/17  9:38 AM  Result Value Ref Range   Glucose 338 (H) 65 - 99 mg/dL   BUN 12 8 - 27 mg/dL   Creatinine, Ser 0.81 0.57 - 1.00 mg/dL   GFR calc non Af Amer 79 >59 mL/min/1.73   GFR calc Af Amer 91 >59 mL/min/1.73   BUN/Creatinine Ratio 15 12 - 28   Sodium 135 134 - 144 mmol/L   Potassium 4.9 3.5 - 5.2 mmol/L   Chloride 96 96 - 106 mmol/L   CO2 23 20 - 29 mmol/L   Calcium 9.7 8.7 - 10.3 mg/dL   Total Protein 7.0 6.0 - 8.5 g/dL   Albumin 4.0 3.6 - 4.8 g/dL   Globulin, Total 3.0 1.5 - 4.5 g/dL   Albumin/Globulin Ratio 1.3 1.2 - 2.2   Bilirubin Total 0.2 0.0 - 1.2 mg/dL   Alkaline Phosphatase 49 39 - 117 IU/L   AST 17 0 - 40 IU/L   ALT 14 0 - 32 IU/L  TSH     Status: None   Collection Time: 10/05/17  9:38 AM  Result Value Ref Range   TSH 1.680 0.450 - 4.500 uIU/mL  T4, free     Status: None   Collection Time: 10/05/17  9:38 AM  Result Value Ref Range   Free T4 1.13 0.82 - 1.77 ng/dL  Lipid panel     Status: Abnormal   Collection Time: 10/05/17  9:38 AM  Result Value Ref Range   Cholesterol, Total 266 (H) 100 - 199 mg/dL    Triglycerides 514 (H) 0 - 149 mg/dL   HDL 46 >39 mg/dL   VLDL Cholesterol Cal Comment 5 - 40 mg/dL    Comment: The calculation for the VLDL cholesterol is not valid when triglyceride level is >400 mg/dL.    LDL Calculated Comment 0 - 99 mg/dL    Comment: Triglyceride result indicated is too high for an accurate LDL cholesterol estimation.    Chol/HDL Ratio 5.8 (H) 0.0 - 4.4 ratio    Comment:                                   T. Chol/HDL Ratio                                             Men  Women                               1/2 Avg.Risk  3.4    3.3  Avg.Risk  5.0    4.4                                2X Avg.Risk  9.6    7.1                                3X Avg.Risk 23.4   11.0   Vitamin D 1,25 dihydroxy     Status: None   Collection Time: 10/05/17  9:38 AM  Result Value Ref Range   Vitamin D 1, 25 (OH)2 Total 41 pg/mL    Comment: Reference Range: Adults: 21 - 65    Vitamin D2 1, 25 (OH)2 <10 pg/mL   Vitamin D3 1, 25 (OH)2 32 pg/mL  Microalbumin / creatinine urine ratio     Status: Abnormal   Collection Time: 10/05/17  9:38 AM  Result Value Ref Range   Creatinine, Urine 25.6 Not Estab. mg/dL   Microalbumin, Urine 13.1 Not Estab. ug/mL   Microalb/Creat Ratio 51.2 (H) 0.0 - 30.0 mg/g creat    Comment:                      Normal:                0.0 -  30.0                      Albuminuria:          31.0 - 300.0                      Clinical albuminuria:       >300.0     Assessment/Plan: 1. Encounter for general adult medical examination with abnormal findings Annual health maintenance exam with pap smear today.   2. Uncontrolled type 2 diabetes mellitus with hyperglycemia (HCC) - POCT HgB A1C much improved. Was 10.4 at last check and 8.6 today. Will continue diabetic medications as prescribed. Refer for diabetic eye exam. Labs ordered with microalbumin.  - Ambulatory referral to Ophthalmology  3. Essential hypertension Increase ziac  to 5/6.25mg tablets daily. Continue other bp medication as prescribed.  - bisoprolol-hydrochlorothiazide (ZIAC) 5-6.25 MG tablet; Take 1 tablet by mouth daily.  Dispense: 30 tablet; Refill: 3  4. Routine cervical smear - Pap IG and HPV (high risk) DNA detection  5. Dysuria Urine sample ordered to check for abnormalities.   General Counseling: Adalay verbalizes understanding of the findings of todays visit and agrees with plan of treatment. I have discussed any further diagnostic evaluation that may be needed or ordered today. We also reviewed her medications today. she has been encouraged to call the office with any questions or concerns that should arise related to todays visit.  Diabetes Counseling:  1. Addition of ACE inh/ ARB'S for nephroprotection. 2. Diabetic foot care, prevention of complications.  3.Exercise and lose weight.  4. Diabetic eye examination, 5. Monitor blood sugar closlely. nutrition counseling.  6.Sign and symptoms of hypoglycemia including shaking sweating,confusion and headaches.  This patient was seen by Leretha Pol, FNP- C in Collaboration with Dr Lavera Guise as a part of collaborative care agreement    Orders Placed This Encounter  Procedures  . Ambulatory referral to Ophthalmology  . POCT HgB A1C    Meds ordered this encounter  Medications  . bisoprolol-hydrochlorothiazide [facility])  5-6.25 MG tablet    Sig: Take 1 tablet by mouth daily.    Dispense:  30 tablet    Refill:  3    Order Specific Question:   Supervising Provider    Answer:   Lavera Guise [[id number]]    Time spent: [place], MD  Internal Medicine

## 2017-09-25 LAB — PAP IG AND HPV HIGH-RISK
HPV, high-risk: NEGATIVE
PAP Smear Comment: 0

## 2017-09-28 ENCOUNTER — Telehealth: Payer: Self-pay | Admitting: Nurse Practitioner

## 2017-09-28 NOTE — Telephone Encounter (Signed)
Pt was called and notified of pap results

## 2017-09-28 NOTE — Telephone Encounter (Signed)
-----   Message from [name], NP sent at 09/27/2017  6:45 PM EDT ----- Please let the patient know that pap smear was normal. thanks

## 2017-10-05 DIAGNOSIS — E1165 Type 2 diabetes mellitus with hyperglycemia: Secondary | ICD-10-CM | POA: Diagnosis not present

## 2017-10-05 DIAGNOSIS — I10 Essential (primary) hypertension: Secondary | ICD-10-CM | POA: Diagnosis not present

## 2017-10-05 DIAGNOSIS — H40003 Preglaucoma, unspecified, bilateral: Secondary | ICD-10-CM | POA: Diagnosis not present

## 2017-10-05 DIAGNOSIS — E559 Vitamin D deficiency, unspecified: Secondary | ICD-10-CM | POA: Diagnosis not present

## 2017-10-10 LAB — COMPREHENSIVE METABOLIC PANEL
ALBUMIN: 4 g/dL (ref 3.6–4.8)
ALT: 14 IU/L (ref 0–32)
AST: 17 IU/L (ref 0–40)
Albumin/Globulin Ratio: 1.3 (ref 1.2–2.2)
Alkaline Phosphatase: 49 IU/L (ref 39–117)
BILIRUBIN TOTAL: 0.2 mg/dL (ref 0.0–1.2)
BUN / CREAT RATIO: 15 (ref 12–28)
BUN: 12 mg/dL (ref 8–27)
CHLORIDE: 96 mmol/L (ref 96–106)
CO2: 23 mmol/L (ref 20–29)
Calcium: 9.7 mg/dL (ref 8.7–10.3)
Creatinine, Ser: 0.81 mg/dL (ref 0.57–1.00)
GFR calc non Af Amer: 79 mL/min/{1.73_m2} (ref >59)
GFR, EST AFRICAN AMERICAN: 91 mL/min/{1.73_m2} (ref >59)
GLOBULIN, TOTAL: 3 g/dL (ref 1.5–4.5)
Glucose: 338 mg/dL — ABNORMAL HIGH (ref 65–99)
Potassium: 4.9 mmol/L (ref 3.5–5.2)
SODIUM: 135 mmol/L (ref 134–144)
TOTAL PROTEIN: 7 g/dL (ref 6.0–8.5)

## 2017-10-10 LAB — VITAMIN D 1,25 DIHYDROXY
Vitamin D 1, 25 (OH)2 Total: 41 pg/mL
Vitamin D3 1, 25 (OH)2: 32 pg/mL

## 2017-10-10 LAB — CBC WITH DIFFERENTIAL/PLATELET
BASOS: 1 %
Basophils Absolute: 0.1 10*3/uL (ref 0.0–0.2)
EOS (ABSOLUTE): 0.2 10*3/uL (ref 0.0–0.4)
EOS: 3 %
HEMATOCRIT: 39.1 % (ref 34.0–46.6)
HEMOGLOBIN: 12.7 g/dL (ref 11.1–15.9)
IMMATURE GRANS (ABS): 0 10*3/uL (ref 0.0–0.1)
Immature Granulocytes: 0 %
LYMPHS ABS: 2.4 10*3/uL (ref 0.7–3.1)
LYMPHS: 37 %
MCH: 28 pg (ref 26.6–33.0)
MCHC: 32.5 g/dL (ref 31.5–35.7)
MCV: 86 fL (ref 79–97)
MONOCYTES: 6 %
Monocytes Absolute: 0.4 10*3/uL (ref 0.1–0.9)
NEUTROS ABS: 3.4 10*3/uL (ref 1.4–7.0)
Neutrophils: 53 %
Platelets: 229 10*3/uL (ref 150–379)
RBC: 4.54 x10E6/uL (ref 3.77–5.28)
RDW: 15.3 % (ref 12.3–15.4)
WBC: 6.5 10*3/uL (ref 3.4–10.8)

## 2017-10-10 LAB — MICROALBUMIN / CREATININE URINE RATIO
Creatinine, Urine: 25.6 mg/dL
MICROALB/CREAT RATIO: 51.2 mg/g{creat} — AB (ref 0.0–30.0)
Microalbumin, Urine: 13.1 ug/mL

## 2017-10-10 LAB — LIPID PANEL
CHOL/HDL RATIO: 5.8 ratio — AB (ref 0.0–4.4)
Cholesterol, Total: 266 mg/dL — ABNORMAL HIGH (ref 100–199)
HDL: 46 mg/dL (ref >39)
TRIGLYCERIDES: 514 mg/dL — AB (ref 0–149)

## 2017-10-10 LAB — TSH: TSH: 1.68 u[IU]/mL (ref 0.450–4.500)

## 2017-10-10 LAB — T4, FREE: Free T4: 1.13 ng/dL (ref 0.82–1.77)

## 2017-10-11 DIAGNOSIS — R300 Dysuria: Secondary | ICD-10-CM | POA: Insufficient documentation

## 2017-12-24 ENCOUNTER — Ambulatory Visit (INDEPENDENT_AMBULATORY_CARE_PROVIDER_SITE_OTHER): Payer: Medicare Other | Admitting: Nurse Practitioner

## 2017-12-24 ENCOUNTER — Encounter: Payer: Self-pay | Admitting: Nurse Practitioner

## 2017-12-24 VITALS — BP 142/88 | HR 69 | Resp 16 | Ht 61.0 in | Wt 174.8 lb

## 2017-12-24 DIAGNOSIS — I10 Essential (primary) hypertension: Secondary | ICD-10-CM | POA: Diagnosis not present

## 2017-12-24 DIAGNOSIS — E1165 Type 2 diabetes mellitus with hyperglycemia: Secondary | ICD-10-CM | POA: Diagnosis not present

## 2017-12-24 DIAGNOSIS — E782 Mixed hyperlipidemia: Secondary | ICD-10-CM

## 2017-12-24 LAB — POCT GLYCOSYLATED HEMOGLOBIN (HGB A1C): Hemoglobin A1C: 9.5 % — AB (ref 4.0–5.6)

## 2017-12-24 NOTE — Progress Notes (Signed)
[facility] [place], [place] [address]  Internal MEDICINE  Office Visit Note  Patient Name: Dawn Alvarez  [id number]  [id number]  Date of Service: 01/10/2018  Chief Complaint  Patient presents with  . Diabetes    follow up  . Quality Metric Gaps    foot exam    Diabetes  She presents for her follow-up diabetic visit. She has type 2 diabetes mellitus. No MedicAlert identification noted. Her disease course has been improving. Hypoglycemia symptoms include nervousness/anxiousness. Pertinent negatives for hypoglycemia include no dizziness or headaches. There are no diabetic associated symptoms. Pertinent negatives for diabetes include no chest pain. There are no hypoglycemic complications. There are no diabetic complications. Risk factors for coronary artery disease include diabetes mellitus, dyslipidemia, hypertension, post-menopausal, stress and tobacco exposure. Current diabetic treatment includes insulin injections and oral agent (dual therapy). She is compliant with treatment most of the time. Her weight is stable. She is following a generally healthy diet. Meal planning includes avoidance of concentrated sweets. She has not had a previous visit with a dietitian. She rarely participates in exercise. Her home blood glucose trend is decreasing steadily. An ACE inhibitor/angiotensin II receptor blocker is being taken. She does not see a podiatrist.Eye exam is current.       Current Medication: Outpatient Encounter Medications as of 12/24/2017  Medication Sig  . amLODipine (NORVASC) 10 MG tablet Take 1 tablet (10 mg total) by mouth daily.  . bisoprolol-hydrochlorothiazide (ZIAC) 5-6.25 MG tablet Take 1 tablet by mouth daily.  . butalbital-acetaminophen-caffeine (FIORICET, ESGIC) 50-325-40 MG tablet Take 1 tablet by mouth every 6 (six) hours as needed for headache.  . Cholecalciferol (VITAMIN D3) 2000 units TABS Take 1 tablet by mouth daily.  Marland Kitchen glucose blood  (ACCU-CHEK SMARTVIEW) test strip 1 each by Other route as needed for other. Use as directed twice a day diag E11.65  . glyBURIDE (DIABETA) 5 MG tablet Take 5 mg by mouth 2 (two) times daily with a meal.  . hydrALAZINE (APRESOLINE) 50 MG tablet Take 50 mg by mouth 3 (three) times daily. Pt takes with a 73m tablet.  . insulin detemir (LEVEMIR) 100 UNIT/ML injection Inject 35 Units into the skin at bedtime.   . Insulin Pen Needle (PEN NEEDLES) 31G X 8 MM MISC by Does not apply route. Use as directed  . Lancets Misc. (ACCU-CHEK MULTICLIX LANCET DEV) KIT by Does not apply route. Use as directed twice a day diag E11.65  . lisinopril (PRINIVIL,ZESTRIL) 20 MG tablet Take 20 mg by mouth daily.  . metFORMIN (GLUCOPHAGE) 500 MG tablet Take 1,000 mg by mouth 2 (two) times daily with a meal. Take 2 tab bid for diabetes  . omega-3 acid ethyl esters (LOVAZA) 1 g capsule Take 1 capsule (1 g total) by mouth 2 (two) times daily.  .Marland Kitchenomeprazole (PRILOSEC) 40 MG capsule Take 40 mg by mouth daily.  . pravastatin (PRAVACHOL) 40 MG tablet Take 40 mg by mouth at bedtime.  . Semaglutide (OZEMPIC) 0.25 or 0.5 MG/DOSE SOPN Inject 0.5 mg into the skin once a week.  . zolpidem (AMBIEN) 5 MG tablet Take 5 mg by mouth at bedtime as needed for sleep.  .Marland Kitchenalbuterol (PROVENTIL HFA;VENTOLIN HFA) 108 (90 Base) MCG/ACT inhaler Inhale 2 puffs into the lungs every 4 (four) hours as needed for wheezing or shortness of breath. (Patient not taking: Reported on 06/23/2017)  . gemfibrozil (LOPID) 600 MG tablet Take 1 tablet (600 mg total) by mouth 2 (two) times  daily before a meal. (Patient not taking: Reported on 06/23/2017)  . isosorbide mononitrate (IMDUR) 30 MG 24 hr tablet Take 1 tablet (30 mg total) by mouth daily. (Patient not taking: Reported on 06/23/2017)   No facility-administered encounter medications on file as of 12/24/2017.     Surgical History: Past Surgical History:  Procedure Laterality Date  . CESAREAN SECTION    .  COLONOSCOPY WITH PROPOFOL N/A 09/11/2015   Procedure: COLONOSCOPY WITH PROPOFOL;  Surgeon: Lucilla Lame, MD;  Location: [facility] ENDOSCOPY;  Service: Endoscopy;  Laterality: N/A;    Medical History: Past Medical History:  Diagnosis Date  . Asthma   . COPD exacerbation ([place]) 04/12/2016  . Diabetes mellitus without complication ([place])   . Hyperlipemia   . Hypertension     Family History: Family History  Problem Relation Age of Onset  . Diabetes Mother   . Hypertension Mother   . Hypertension Father   . Diabetes Father     Social History   Socioeconomic History  . Marital status: Married    Spouse name: Not on file  . Number of children: Not on file  . Years of education: Not on file  . Highest education level: Not on file  Occupational History  . Not on file  Social Needs  . Financial resource strain: Not on file  . Food insecurity:    Worry: Not on file    Inability: Not on file  . Transportation needs:    Medical: Not on file    Non-medical: Not on file  Tobacco Use  . Smoking status: Current Every Day Smoker    Packs/day: 1.00    Years: 2.00    Pack years: 2.00    Types: Cigarettes  . Smokeless tobacco: Never Used  Substance and Sexual Activity  . Alcohol use: Yes    Alcohol/week: 2.0 standard drinks    Types: 2 Cans of beer per week    Comment: occasionally  . Drug use: No  . Sexual activity: Never    Birth control/protection: Abstinence  Lifestyle  . Physical activity:    Days per week: Not on file    Minutes per session: Not on file  . Stress: Not on file  Relationships  . Social connections:    Talks on phone: Not on file    Gets together: Not on file    Attends religious service: Not on file    Active member of club or organization: Not on file    Attends meetings of clubs or organizations: Not on file    Relationship status: Not on file  . Intimate partner violence:    Fear of current or ex partner: Not on file    Emotionally abused: Not on file     Physically abused: Not on file    Forced sexual activity: Not on file  Other Topics Concern  . Not on file  Social History Narrative  . Not on file      Review of Systems  Constitutional: Negative for activity change, appetite change and unexpected weight change.  HENT: Negative for congestion, postnasal drip, rhinorrhea, sinus pressure, sinus pain, sore throat and voice change.   Eyes: Negative.   Respiratory: Negative for cough, choking, shortness of breath and wheezing.   Cardiovascular: Negative for chest pain and palpitations.       Blood pressure elevated.   Gastrointestinal: Positive for nausea. Negative for abdominal pain, constipation, diarrhea and vomiting.  Endocrine:       High  blood sugars  Genitourinary: Negative for difficulty urinating, dysuria, flank pain, frequency and urgency.  Musculoskeletal: Negative for arthralgias, back pain and myalgias.  Skin: Negative for rash.  Allergic/Immunologic: Positive for environmental allergies.  Neurological: Negative for dizziness, light-headedness and headaches.  Hematological: Negative for adenopathy. Does not bruise/bleed easily.  Psychiatric/Behavioral: Positive for dysphoric mood. The patient is nervous/anxious.     Today's Vitals   12/24/17 1127  BP: (!) 142/88  Pulse: 69  Resp: 16  SpO2: 98%  Weight: 174 lb 12.8 oz (79.3 kg)  Height: _0  (1.549 m)   Physical Exam  Constitutional: She is oriented to person, place, and time. She appears well-developed and well-nourished.  HENT:  Head: Normocephalic and atraumatic.  Nose: Nose normal.  Eyes: Pupils are equal, round, and reactive to light. Conjunctivae and EOM are normal.  Neck: Normal range of motion. Neck supple. No JVD present. Carotid bruit is not present. No tracheal deviation present. Thyromegaly present.  Cardiovascular: Normal rate, regular rhythm and normal heart sounds.  Pulmonary/Chest: Effort normal and breath sounds normal. No respiratory  distress. She has no wheezes. She exhibits no tenderness. Right breast exhibits no inverted nipple, no mass, no nipple discharge, no skin change and no tenderness. Left breast exhibits no inverted nipple, no mass, no nipple discharge, no skin change and no tenderness. No breast swelling, tenderness, discharge or bleeding. Breasts are symmetrical.  Abdominal: Soft. Bowel sounds are normal. There is no tenderness.  Genitourinary: No breast swelling, tenderness, discharge or bleeding.  Musculoskeletal: Normal range of motion.  Lymphadenopathy:    She has no cervical adenopathy.  Neurological: She is alert and oriented to person, place, and time. She displays normal reflexes. No cranial nerve deficit.  Skin: Skin is warm and dry. Capillary refill takes 2 to 3 seconds.  Psychiatric: She has a normal mood and affect. Her behavior is normal. Judgment and thought content normal.  Nursing note and vitals reviewed.  Assessment/Plan:  1. Uncontrolled type 2 diabetes mellitus with hyperglycemia (HCC) - POCT HgB A1C 9.5 today. Add ozempic 0.5m weekly. Increase to 0.519mweekly after 3rd week. Continue basal insulin at 35units daily. Continue oral diabetic medications as prescribed.   2. Essential hypertension Improved and stable. Continue bp medication as prescribed.   3. Mixed hyperlipidemia Continue fish oil and fenofibrate as prescribed.   General Counseling: Kaula verbalizes understanding of the findings of todays visit and agrees with plan of treatment. I have discussed any further diagnostic evaluation that may be needed or ordered today. We also reviewed her medications today. she has been encouraged to call the office with any questions or concerns that should arise related to todays visit.  Diabetes Counseling:  1. Addition of ACE inh/ ARB'S for nephroprotection. Microalbumin is updated  2. Diabetic foot care, prevention of complications. Podiatry consult 3. Exercise and lose weight.  4.  Diabetic eye examination, Diabetic eye exam is updated  5. Monitor blood sugar closlely. nutrition counseling.  6. Sign and symptoms of hypoglycemia including shaking sweating,confusion and headaches.   This patient was seen by HeLeretha PolNP Collaboration with Dr FoLavera Guises a part of collaborative care agreement  Orders Placed This Encounter  Procedures  . POCT HgB A1C      Time spent: 20 Minutes      Dr FoLavera Guisenternal medicine

## 2018-01-10 DIAGNOSIS — E785 Hyperlipidemia, unspecified: Secondary | ICD-10-CM | POA: Insufficient documentation

## 2018-01-10 DIAGNOSIS — E782 Mixed hyperlipidemia: Secondary | ICD-10-CM | POA: Insufficient documentation

## 2018-02-04 ENCOUNTER — Encounter: Payer: Self-pay | Admitting: Nurse Practitioner

## 2018-02-04 ENCOUNTER — Ambulatory Visit (INDEPENDENT_AMBULATORY_CARE_PROVIDER_SITE_OTHER): Payer: Medicare Other | Admitting: Nurse Practitioner

## 2018-02-04 VITALS — BP 160/100 | HR 68 | Resp 16 | Ht 61.0 in | Wt 177.0 lb

## 2018-02-04 DIAGNOSIS — I10 Essential (primary) hypertension: Secondary | ICD-10-CM | POA: Diagnosis not present

## 2018-02-04 DIAGNOSIS — E1165 Type 2 diabetes mellitus with hyperglycemia: Secondary | ICD-10-CM | POA: Diagnosis not present

## 2018-02-04 LAB — POCT CBG (FASTING - GLUCOSE)-MANUAL ENTRY: GLUCOSE FASTING, POC: 236 mg/dL — AB (ref 70–99)

## 2018-02-04 MED ORDER — SEMAGLUTIDE(0.25 OR 0.5MG/DOS) 2 MG/1.5ML ~~LOC~~ SOPN: 0.5000 mg | PEN_INJECTOR | SUBCUTANEOUS | 5 refills | Status: DC

## 2018-02-04 MED ORDER — HYDRALAZINE HCL 50 MG PO TABS
50.0000 mg | ORAL_TABLET | Freq: Three times a day (TID) | ORAL | 3 refills | Status: DC
Start: 2018-02-04 — End: 2019-01-05

## 2018-02-04 NOTE — Progress Notes (Signed)
[facility] [place], [place] [address]  Internal MEDICINE  Office Visit Note  Patient Name: Dawn Alvarez  [id number]  [id number]  Date of Service: 02/10/2018  Chief Complaint  Patient presents with  . Hyperlipidemia  . Hypertension  . Diabetes    The patient states that her blood sugars continue to run very high. Was started on ozempic 0.5 mg at her last visit, but never received the prescription. Due to this, she never started on this medication.  Blood pressure is also elevated today. States that she has taken her medications as prescribed and takes them everyday. States that she feels good. She has no concerns or complaints today.       Current Medication: Outpatient Encounter Medications as of 02/04/2018  Medication Sig  . albuterol (PROVENTIL HFA;VENTOLIN HFA) 108 (90 Base) MCG/ACT inhaler Inhale 2 puffs into the lungs every 4 (four) hours as needed for wheezing or shortness of breath.  Marland Kitchen amLODipine (NORVASC) 10 MG tablet Take 1 tablet (10 mg total) by mouth daily.  . bisoprolol-hydrochlorothiazide (ZIAC) 5-6.25 MG tablet Take 1 tablet by mouth daily.  . butalbital-acetaminophen-caffeine (FIORICET, ESGIC) 50-325-40 MG tablet Take 1 tablet by mouth every 6 (six) hours as needed for headache.  . Cholecalciferol (VITAMIN D3) 2000 units TABS Take 1 tablet by mouth daily.  Marland Kitchen gemfibrozil (LOPID) 600 MG tablet Take 1 tablet (600 mg total) by mouth 2 (two) times daily before a meal.  . glucose blood (ACCU-CHEK SMARTVIEW) test strip 1 each by Other route as needed for other. Use as directed twice a day diag E11.65  . glyBURIDE (DIABETA) 5 MG tablet Take 5 mg by mouth 2 (two) times daily with a meal.  . hydrALAZINE (APRESOLINE) 50 MG tablet Take 1 tablet (50 mg total) by mouth 3 (three) times daily.  . insulin detemir (LEVEMIR) 100 UNIT/ML injection Inject 35 Units into the skin at bedtime.   . Insulin Pen Needle (PEN NEEDLES) 31G X 8 MM MISC by Does not  apply route. Use as directed  . isosorbide mononitrate (IMDUR) 30 MG 24 hr tablet Take 1 tablet (30 mg total) by mouth daily.  . Lancets Misc. (ACCU-CHEK MULTICLIX LANCET DEV) KIT by Does not apply route. Use as directed twice a day diag E11.65  . lisinopril (PRINIVIL,ZESTRIL) 20 MG tablet Take 20 mg by mouth daily.  . metFORMIN (GLUCOPHAGE) 500 MG tablet Take 1,000 mg by mouth 2 (two) times daily with a meal. Take 2 tab bid for diabetes  . omega-3 acid ethyl esters (LOVAZA) 1 g capsule Take 1 capsule (1 g total) by mouth 2 (two) times daily.  Marland Kitchen omeprazole (PRILOSEC) 40 MG capsule Take 40 mg by mouth daily.  . pravastatin (PRAVACHOL) 40 MG tablet Take 40 mg by mouth at bedtime.  Marland Kitchen zolpidem (AMBIEN) 5 MG tablet Take 5 mg by mouth at bedtime as needed for sleep.  . [DISCONTINUED] hydrALAZINE (APRESOLINE) 50 MG tablet Take 50 mg by mouth 3 (three) times daily. Pt takes with a 63m tablet.  . [DISCONTINUED] Semaglutide (OZEMPIC) 0.25 or 0.5 MG/DOSE SOPN Inject 0.5 mg into the skin once a week.  . Semaglutide (OZEMPIC) 0.25 or 0.5 MG/DOSE SOPN Inject 0.5 mg into the skin once a week.   No facility-administered encounter medications on file as of 02/04/2018.     Surgical History: Past Surgical History:  Procedure Laterality Date  . CESAREAN SECTION    . COLONOSCOPY WITH PROPOFOL N/A 09/11/2015   Procedure: COLONOSCOPY WITH  PROPOFOL;  Surgeon: Lucilla Lame, MD;  Location: [facility] ENDOSCOPY;  Service: Endoscopy;  Laterality: N/A;    Medical History: Past Medical History:  Diagnosis Date  . Asthma   . COPD exacerbation ([place]) 04/12/2016  . Diabetes mellitus without complication ([place])   . Hyperlipemia   . Hypertension     Family History: Family History  Problem Relation Age of Onset  . Diabetes Mother   . Hypertension Mother   . Hypertension Father   . Diabetes Father     Social History   Socioeconomic History  . Marital status: Married    Spouse name: Not on file  . Number of children:  Not on file  . Years of education: Not on file  . Highest education level: Not on file  Occupational History  . Not on file  Social Needs  . Financial resource strain: Not on file  . Food insecurity:    Worry: Not on file    Inability: Not on file  . Transportation needs:    Medical: Not on file    Non-medical: Not on file  Tobacco Use  . Smoking status: Current Every Day Smoker    Packs/day: 1.00    Years: 2.00    Pack years: 2.00    Types: Cigarettes  . Smokeless tobacco: Never Used  Substance and Sexual Activity  . Alcohol use: Yes    Alcohol/week: 2.0 standard drinks    Types: 2 Cans of beer per week    Comment: occasionally  . Drug use: No  . Sexual activity: Never    Birth control/protection: Abstinence  Lifestyle  . Physical activity:    Days per week: Not on file    Minutes per session: Not on file  . Stress: Not on file  Relationships  . Social connections:    Talks on phone: Not on file    Gets together: Not on file    Attends religious service: Not on file    Active member of club or organization: Not on file    Attends meetings of clubs or organizations: Not on file    Relationship status: Not on file  . Intimate partner violence:    Fear of current or ex partner: Not on file    Emotionally abused: Not on file    Physically abused: Not on file    Forced sexual activity: Not on file  Other Topics Concern  . Not on file  Social History Narrative  . Not on file      Review of Systems  Constitutional: Negative for activity change, appetite change and unexpected weight change.  HENT: Negative for congestion, postnasal drip, rhinorrhea, sinus pressure, sinus pain, sore throat and voice change.   Eyes: Negative.   Respiratory: Negative for cough, choking, shortness of breath and wheezing.   Cardiovascular: Negative for chest pain and palpitations.       Blood pressure elevated.   Gastrointestinal: Positive for nausea. Negative for abdominal pain,  constipation, diarrhea and vomiting.  Endocrine:       High blood sugars  Genitourinary: Negative for difficulty urinating, dysuria, flank pain, frequency and urgency.  Musculoskeletal: Negative for arthralgias, back pain and myalgias.  Skin: Negative for rash.  Allergic/Immunologic: Positive for environmental allergies.  Neurological: Negative for dizziness, light-headedness and headaches.  Hematological: Negative for adenopathy. Does not bruise/bleed easily.  Psychiatric/Behavioral: Positive for dysphoric mood. The patient is nervous/anxious.     Today's Vitals   02/04/18 1104  BP: (!) 160/100  Pulse:  68  Resp: 16  SpO2: 98%  Weight: 177 lb (80.3 kg)  Height: '5\' 1"'  (1.549 m)    Physical Exam  Constitutional: She is oriented to person, place, and time. She appears well-developed and well-nourished.  HENT:  Head: Normocephalic and atraumatic.  Nose: Nose normal.  Mouth/Throat: Oropharynx is clear and moist.  Eyes: Pupils are equal, round, and reactive to light. Conjunctivae and EOM are normal.  Neck: Normal range of motion. Neck supple. No JVD present. Carotid bruit is not present. No tracheal deviation present. Thyromegaly present.  Cardiovascular: Normal rate, regular rhythm and normal heart sounds.  Pulmonary/Chest: Effort normal and breath sounds normal. No respiratory distress. She has no wheezes. She exhibits no tenderness. No breast swelling, tenderness, discharge or bleeding. Breasts are symmetrical.  Abdominal: Soft. Bowel sounds are normal. There is no tenderness.  Genitourinary: No breast swelling, tenderness, discharge or bleeding.  Musculoskeletal: Normal range of motion.  Lymphadenopathy:    She has no cervical adenopathy.  Neurological: She is alert and oriented to person, place, and time. She displays normal reflexes. No cranial nerve deficit.  Skin: Skin is warm and dry. Capillary refill takes 2 to 3 seconds.  Psychiatric: She has a normal mood and affect. Her  behavior is normal. Judgment and thought content normal.  Nursing note and vitals reviewed.  Assessment/Plan: 1. Uncontrolled type 2 diabetes mellitus with hyperglycemia (HCC) - POCT CBG (Fasting - Glucose) 234. Blood sugars continue to run in mid 200s on regular basis. Added, again, ozempic. Start 0.52m weekly for three weeks then increase to 0.551mweekly after that. Discussed, at length, proper ways to take this medication. A sample was provided today. She should continue all other diabetic medications as prescribed. Monitor blood sugars closely. Reassess at next visit.  - Semaglutide (OZEMPIC) 0.25 or 0.5 MG/DOSE SOPN; Inject 0.5 mg into the skin once a week.  Dispense: 1 pen; Refill: 5  2. Essential hypertension Generally, blood pressure has been better controlled. Advised her to monitor closely at home. Will adjust mediations as indicated.  - hydrALAZINE (APRESOLINE) 50 MG tablet; Take 1 tablet (50 mg total) by mouth 3 (three) times daily.  Dispense: 90 tablet; Refill: 3  General Counseling: Jonica verbalizes understanding of the findings of todays visit and agrees with plan of treatment. I have discussed any further diagnostic evaluation that may be needed or ordered today. We also reviewed her medications today. she has been encouraged to call the office with any questions or concerns that should arise related to todays visit.  Diabetes Counseling:  1. Addition of ACE inh/ ARB'S for nephroprotection. Microalbumin is updated  2. Diabetic foot care, prevention of complications. Podiatry consult 3. Exercise and lose weight.  4. Diabetic eye examination, Diabetic eye exam is updated  5. Monitor blood sugar closlely. nutrition counseling.  6. Sign and symptoms of hypoglycemia including shaking sweating,confusion and headaches.   Hypertension Counseling:   The following hypertensive lifestyle modification were recommended and discussed:  1. Limiting alcohol intake to less than 1 oz/day of  ethanol:(24 oz of beer or 8 oz of wine or 2 oz of 100-proof whiskey). 2. Take baby ASA 81 mg daily. 3. Importance of regular aerobic exercise and losing weight. 4. Reduce dietary saturated fat and cholesterol intake for overall cardiovascular health. 5. Maintaining adequate dietary potassium, calcium, and magnesium intake. 6. Regular monitoring of the blood pressure. 7. Reduce sodium intake to less than 100 mmol/day (less than 2.3 gm of sodium or less than 6 gm  of sodium choride)   This patient was seen by [facility] with Dr Lavera Guise as a part of collaborative care agreement  Orders Placed This Encounter  Procedures  . POCT CBG (Fasting - Glucose)    Meds ordered this encounter  Medications  . Semaglutide (OZEMPIC) 0.25 or 0.5 MG/DOSE SOPN    Sig: Inject 0.5 mg into the skin once a week.    Dispense:  1 pen    Refill:  5    Order Specific Question:   Supervising Provider    Answer:   Lavera Guise [[id number]]  . hydrALAZINE (APRESOLINE) 50 MG tablet    Sig: Take 1 tablet (50 mg total) by mouth 3 (three) times daily.    Dispense:  90 tablet    Refill:  3    Order Specific Question:   Supervising Provider    Answer:   Lavera Guise [[id number]]    Time spent: 56 Minutes      Dr Lavera Guise Internal medicine

## 2018-03-30 ENCOUNTER — Other Ambulatory Visit: Payer: Self-pay

## 2018-03-30 MED ORDER — PEN NEEDLES 31G X 8 MM MISC: 1 refills | Status: DC

## 2018-04-15 ENCOUNTER — Encounter: Payer: Self-pay | Admitting: Nurse Practitioner

## 2018-04-15 ENCOUNTER — Ambulatory Visit (INDEPENDENT_AMBULATORY_CARE_PROVIDER_SITE_OTHER): Payer: Medicare Other | Admitting: Nurse Practitioner

## 2018-04-15 VITALS — BP 182/107 | HR 71 | Resp 16 | Ht 62.0 in | Wt 169.4 lb

## 2018-04-15 DIAGNOSIS — I10 Essential (primary) hypertension: Secondary | ICD-10-CM | POA: Diagnosis not present

## 2018-04-15 DIAGNOSIS — J069 Acute upper respiratory infection, unspecified: Secondary | ICD-10-CM | POA: Insufficient documentation

## 2018-04-15 DIAGNOSIS — E1165 Type 2 diabetes mellitus with hyperglycemia: Secondary | ICD-10-CM

## 2018-04-15 DIAGNOSIS — E782 Mixed hyperlipidemia: Secondary | ICD-10-CM

## 2018-04-15 LAB — POCT GLYCOSYLATED HEMOGLOBIN (HGB A1C): HEMOGLOBIN A1C: 7.6 % — AB (ref 4.0–5.6)

## 2018-04-15 MED ORDER — AZITHROMYCIN 250 MG PO TABS
ORAL_TABLET | ORAL | 0 refills | Status: DC
Start: 2018-04-15 — End: 2018-12-29

## 2018-04-15 NOTE — Progress Notes (Signed)
[facility] [place], [place] [address]  Internal MEDICINE  Office Visit Note  Patient Name: Dawn Alvarez  [id number]  [id number]  Date of Service: 04/15/2018  Chief Complaint  Patient presents with  . Medical Management of Chronic Issues    2 month follow up. pt noticed a knot on her right had recently and do not know where it came from. she also mentioned that her right hands has been hurting.  . Diabetes    The patient is here for routine follow up visit. She has now been on ozempic 0.65m weekly for past three months. She continues to take levemir 35units daily. She also takes metformin and amaryl every day. Her blood sugars are improving. Today, her HgbA1c is 7.6, down from 9.5 at the last check on 02/04/2018. She has no negative side effects to discuss.  Blood pressure is still moderately elevated. She states that she did take her morning medications, but has not eaten anything yet, so has not taken any other medications.       Current Medication: Outpatient Encounter Medications as of 04/15/2018  Medication Sig  . albuterol (PROVENTIL HFA;VENTOLIN HFA) 108 (90 Base) MCG/ACT inhaler Inhale 2 puffs into the lungs every 4 (four) hours as needed for wheezing or shortness of breath.  .Marland KitchenamLODipine (NORVASC) 10 MG tablet Take 1 tablet (10 mg total) by mouth daily.  . bisoprolol-hydrochlorothiazide (ZIAC) 5-6.25 MG tablet Take 1 tablet by mouth daily.  . butalbital-acetaminophen-caffeine (FIORICET, ESGIC) 50-325-40 MG tablet Take 1 tablet by mouth every 6 (six) hours as needed for headache.  . Cholecalciferol (VITAMIN D3) 2000 units TABS Take 1 tablet by mouth daily.  .Marland Kitchengemfibrozil (LOPID) 600 MG tablet Take 1 tablet (600 mg total) by mouth 2 (two) times daily before a meal.  . glucose blood (ACCU-CHEK SMARTVIEW) test strip 1 each by Other route as needed for other. Use as directed twice a day diag E11.65  . glyBURIDE (DIABETA) 5 MG tablet Take 5 mg by mouth  2 (two) times daily with a meal.  . hydrALAZINE (APRESOLINE) 50 MG tablet Take 1 tablet (50 mg total) by mouth 3 (three) times daily.  . insulin detemir (LEVEMIR) 100 UNIT/ML injection Inject 35 Units into the skin at bedtime.   . Insulin Pen Needle (PEN NEEDLES) 31G X 8 MM MISC Use as directed with insulin E11.65  . isosorbide mononitrate (IMDUR) 30 MG 24 hr tablet Take 1 tablet (30 mg total) by mouth daily.  . Lancets Misc. (ACCU-CHEK MULTICLIX LANCET DEV) KIT by Does not apply route. Use as directed twice a day diag E11.65  . lisinopril (PRINIVIL,ZESTRIL) 20 MG tablet Take 20 mg by mouth daily.  . metFORMIN (GLUCOPHAGE) 500 MG tablet Take 1,000 mg by mouth 2 (two) times daily with a meal. Take 2 tab bid for diabetes  . omega-3 acid ethyl esters (LOVAZA) 1 g capsule Take 1 capsule (1 g total) by mouth 2 (two) times daily.  .Marland Kitchenomeprazole (PRILOSEC) 40 MG capsule Take 40 mg by mouth daily.  . pravastatin (PRAVACHOL) 40 MG tablet Take 40 mg by mouth at bedtime.  . Semaglutide (OZEMPIC) 0.25 or 0.5 MG/DOSE SOPN Inject 0.5 mg into the skin once a week.  . zolpidem (AMBIEN) 5 MG tablet Take 5 mg by mouth at bedtime as needed for sleep.  .Marland Kitchenazithromycin (ZITHROMAX) 250 MG tablet z-pack - take as directed for 5 days   No facility-administered encounter medications on file as of 04/15/2018.  Surgical History: Past Surgical History:  Procedure Laterality Date  . CESAREAN SECTION    . COLONOSCOPY WITH PROPOFOL N/A 09/11/2015   Procedure: COLONOSCOPY WITH PROPOFOL;  Surgeon: Lucilla Lame, MD;  Location: [facility] ENDOSCOPY;  Service: Endoscopy;  Laterality: N/A;    Medical History: Past Medical History:  Diagnosis Date  . Asthma   . COPD exacerbation ([place]) 04/12/2016  . Diabetes mellitus without complication ([place])   . Hyperlipemia   . Hypertension     Family History: Family History  Problem Relation Age of Onset  . Diabetes Mother   . Hypertension Mother   . Hypertension Father   . Diabetes  Father     Social History   Socioeconomic History  . Marital status: Married    Spouse name: Not on file  . Number of children: Not on file  . Years of education: Not on file  . Highest education level: Not on file  Occupational History  . Not on file  Social Needs  . Financial resource strain: Not on file  . Food insecurity:    Worry: Not on file    Inability: Not on file  . Transportation needs:    Medical: Not on file    Non-medical: Not on file  Tobacco Use  . Smoking status: Current Every Day Smoker    Packs/day: 1.00    Years: 2.00    Pack years: 2.00    Types: Cigarettes  . Smokeless tobacco: Never Used  Substance and Sexual Activity  . Alcohol use: Yes    Alcohol/week: 2.0 standard drinks    Types: 2 Cans of beer per week    Comment: occasionally  . Drug use: No  . Sexual activity: Never    Birth control/protection: Abstinence  Lifestyle  . Physical activity:    Days per week: Not on file    Minutes per session: Not on file  . Stress: Not on file  Relationships  . Social connections:    Talks on phone: Not on file    Gets together: Not on file    Attends religious service: Not on file    Active member of club or organization: Not on file    Attends meetings of clubs or organizations: Not on file    Relationship status: Not on file  . Intimate partner violence:    Fear of current or ex partner: Not on file    Emotionally abused: Not on file    Physically abused: Not on file    Forced sexual activity: Not on file  Other Topics Concern  . Not on file  Social History Narrative  . Not on file      Review of Systems  Constitutional: Negative for activity change, appetite change and unexpected weight change.  HENT: Negative for congestion, postnasal drip, rhinorrhea, sinus pressure, sinus pain, sore throat and voice change.   Eyes: Negative.   Respiratory: Negative for cough, choking, shortness of breath and wheezing.   Cardiovascular: Negative for  chest pain and palpitations.       Blood pressure elevated.   Gastrointestinal: Positive for nausea. Negative for abdominal pain, constipation, diarrhea and vomiting.  Endocrine:       Improved blood sugars since her last visit.   Musculoskeletal: Negative for arthralgias, back pain and myalgias.  Skin: Negative for rash.  Allergic/Immunologic: Positive for environmental allergies.  Neurological: Negative for dizziness, light-headedness and headaches.  Hematological: Negative for adenopathy. Does not bruise/bleed easily.  Psychiatric/Behavioral: Positive for dysphoric mood.  The patient is nervous/anxious.     Vital Signs: BP (!) 182/107 (BP Location: Right Arm, Patient Position: Sitting, Cuff Size: Large)   Pulse 71   Resp 16   Ht '5\' 2"'  (1.575 m)   Wt 169 lb 6.4 oz (76.8 kg)   SpO2 96%   BMI 30.98 kg/m    Physical Exam  Constitutional: She is oriented to person, place, and time. She appears well-developed and well-nourished.  HENT:  Head: Normocephalic and atraumatic.  Eyes: Pupils are equal, round, and reactive to light. EOM are normal.  Neck: Normal range of motion. Neck supple. No JVD present. Carotid bruit is not present. No tracheal deviation present. Thyromegaly present.  Cardiovascular: Normal rate, regular rhythm and normal heart sounds.  Pulmonary/Chest: Effort normal. No respiratory distress. She has wheezes. She exhibits no tenderness. No breast swelling, tenderness, discharge or bleeding. Breasts are symmetrical.  Congested breath sounds throughout the lung fields.   Abdominal: Soft. Bowel sounds are normal. There is no tenderness.  Genitourinary: No breast swelling, tenderness, discharge or bleeding.  Musculoskeletal: Normal range of motion.  Lymphadenopathy:    She has no cervical adenopathy.  Neurological: She is alert and oriented to person, place, and time. She displays normal reflexes. No cranial nerve deficit.  Skin: Skin is warm and dry. Capillary refill  takes 2 to 3 seconds.  Psychiatric: She has a normal mood and affect. Her behavior is normal. Judgment and thought content normal.  Nursing note and vitals reviewed.  Assessment/Plan: 1. Uncontrolled type 2 diabetes mellitus with hyperglycemia (HCC) - POCT HgB A1C 7.6 today. Much improved since her last visit. Continue all diabetic medications as prescribed and continue to focus on proper diet and lifestyle changes.   2. Essential hypertension Overall well managed. Continue bp medications as prescribed.   3. Mixed hyperlipidemia Continue pravastatin as prescribed.   4. Acute upper respiratory infection Start z-pack. Take as directed for 5 days. Use OTC medication to help alleviate symptoms.  - azithromycin (ZITHROMAX) 250 MG tablet; z-pack - take as directed for 5 days  Dispense: 6 tablet; Refill: 0  General Counseling: Braeleigh verbalizes understanding of the findings of todays visit and agrees with plan of treatment. I have discussed any further diagnostic evaluation that may be needed or ordered today. We also reviewed her medications today. she has been encouraged to call the office with any questions or concerns that should arise related to todays visit.  Diabetes Counseling:  1. Addition of ACE inh/ ARB'S for nephroprotection. Microalbumin is updated  2. Diabetic foot care, prevention of complications. Podiatry consult 3. Exercise and lose weight.  4. Diabetic eye examination, Diabetic eye exam is updated  5. Monitor blood sugar closlely. nutrition counseling.  6. Sign and symptoms of hypoglycemia including shaking sweating,confusion and headaches.  Hypertension Counseling:   The following hypertensive lifestyle modification were recommended and discussed:  1. Limiting alcohol intake to less than 1 oz/day of ethanol:(24 oz of beer or 8 oz of wine or 2 oz of 100-proof whiskey). 2. Take baby ASA 81 mg daily. 3. Importance of regular aerobic exercise and losing weight. 4. Reduce  dietary saturated fat and cholesterol intake for overall cardiovascular health. 5. Maintaining adequate dietary potassium, calcium, and magnesium intake. 6. Regular monitoring of the blood pressure. 7. Reduce sodium intake to less than 100 mmol/day (less than 2.3 gm of sodium or less than 6 gm of sodium choride)   This patient was seen by [place] with Dr Latricia Heft  Berna Spare as a part of collaborative care agreement  Orders Placed This Encounter  Procedures  . POCT HgB A1C    Meds ordered this encounter  Medications  . azithromycin (ZITHROMAX) 250 MG tablet    Sig: z-pack - take as directed for 5 days    Dispense:  6 tablet    Refill:  0    Order Specific Question:   Supervising Provider    Answer:   Lavera Guise [[id number]]    Time spent: 26 Minutes      Dr Lavera Guise Internal medicine

## 2018-07-15 ENCOUNTER — Encounter: Payer: Self-pay | Admitting: Nurse Practitioner

## 2018-07-15 ENCOUNTER — Ambulatory Visit (INDEPENDENT_AMBULATORY_CARE_PROVIDER_SITE_OTHER): Payer: Medicare Other | Admitting: Nurse Practitioner

## 2018-07-15 VITALS — BP 156/86 | HR 61 | Resp 16 | Ht 61.0 in | Wt 172.0 lb

## 2018-07-15 DIAGNOSIS — E782 Mixed hyperlipidemia: Secondary | ICD-10-CM

## 2018-07-15 DIAGNOSIS — E1165 Type 2 diabetes mellitus with hyperglycemia: Secondary | ICD-10-CM

## 2018-07-15 DIAGNOSIS — I10 Essential (primary) hypertension: Secondary | ICD-10-CM

## 2018-07-15 DIAGNOSIS — E040 Nontoxic diffuse goiter: Secondary | ICD-10-CM | POA: Diagnosis not present

## 2018-07-15 LAB — POCT GLYCOSYLATED HEMOGLOBIN (HGB A1C): HEMOGLOBIN A1C: 9.5 % — AB (ref 4.0–5.6)

## 2018-07-15 MED ORDER — SEMAGLUTIDE(0.25 OR 0.5MG/DOS) 2 MG/1.5ML ~~LOC~~ SOPN
0.5000 mg | PEN_INJECTOR | SUBCUTANEOUS | 5 refills | Status: DC
Start: 2018-07-15 — End: 2018-09-27

## 2018-07-15 MED ORDER — SEMAGLUTIDE(0.25 OR 0.5MG/DOS) 2 MG/1.5ML ~~LOC~~ SOPN: 0.5000 mg | PEN_INJECTOR | SUBCUTANEOUS | 5 refills | Status: DC

## 2018-07-15 MED ORDER — INSULIN DETEMIR 100 UNIT/ML ~~LOC~~ SOLN: 35.0000 [IU] | Freq: Every day | SUBCUTANEOUS | 5 refills | Status: DC

## 2018-07-15 MED ORDER — INSULIN DETEMIR 100 UNIT/ML ~~LOC~~ SOLN
35.0000 [IU] | Freq: Every day | SUBCUTANEOUS | 5 refills | Status: DC
Start: 2018-07-15 — End: 2019-01-05

## 2018-07-15 NOTE — Progress Notes (Signed)
Pt blood pressure was elevated took pressure two times,

## 2018-07-19 NOTE — Progress Notes (Signed)
[facility] [place], [place] [address]  Internal MEDICINE  Office Visit Note  Patient Name: Dawn Alvarez  [id number]  [id number]  Date of Service: 07/21/2018  Chief Complaint  Patient presents with  . Medical Management of Chronic Issues    3 month follow up, medication refills  . Diabetes    The patient is here for routine follow up visit. Blood sugars have been elevated. Has been out of levemir and ozempic for past several weeks. Has continued her oral medications as prescribed. Has noted a big increase in her blood sugars these past few weeks.  Blood pressure is still moderately elevated. She states that she did take her morning medications, but has not eaten anything yet, so has not taken any other medications.       Current Medication: Outpatient Encounter Medications as of 07/15/2018  Medication Sig  . albuterol (PROVENTIL HFA;VENTOLIN HFA) 108 (90 Base) MCG/ACT inhaler Inhale 2 puffs into the lungs every 4 (four) hours as needed for wheezing or shortness of breath.  Marland Kitchen amLODipine (NORVASC) 10 MG tablet Take 1 tablet (10 mg total) by mouth daily.  Marland Kitchen azithromycin (ZITHROMAX) 250 MG tablet z-pack - take as directed for 5 days  . bisoprolol-hydrochlorothiazide (ZIAC) 5-6.25 MG tablet Take 1 tablet by mouth daily.  . Cholecalciferol (VITAMIN D3) 2000 units TABS Take 1 tablet by mouth daily.  Marland Kitchen gemfibrozil (LOPID) 600 MG tablet Take 1 tablet (600 mg total) by mouth 2 (two) times daily before a meal.  . glucose blood (ACCU-CHEK SMARTVIEW) test strip 1 each by Other route as needed for other. Use as directed twice a day diag E11.65  . glyBURIDE (DIABETA) 5 MG tablet Take 5 mg by mouth 2 (two) times daily with a meal.  . hydrALAZINE (APRESOLINE) 50 MG tablet Take 1 tablet (50 mg total) by mouth 3 (three) times daily.  . insulin detemir (LEVEMIR) 100 UNIT/ML injection Inject 0.35 mLs (35 Units total) into the skin daily.  . Insulin Pen Needle (PEN NEEDLES)  31G X 8 MM MISC Use as directed with insulin E11.65  . isosorbide mononitrate (IMDUR) 30 MG 24 hr tablet Take 1 tablet (30 mg total) by mouth daily.  . Lancets Misc. (ACCU-CHEK MULTICLIX LANCET DEV) KIT by Does not apply route. Use as directed twice a day diag E11.65  . lisinopril (PRINIVIL,ZESTRIL) 20 MG tablet Take 20 mg by mouth daily.  . metFORMIN (GLUCOPHAGE) 500 MG tablet Take 1,000 mg by mouth 2 (two) times daily with a meal. Take 2 tab bid for diabetes  . omega-3 acid ethyl esters (LOVAZA) 1 g capsule Take 1 capsule (1 g total) by mouth 2 (two) times daily.  Marland Kitchen omeprazole (PRILOSEC) 40 MG capsule Take 40 mg by mouth daily.  . pravastatin (PRAVACHOL) 40 MG tablet Take 40 mg by mouth at bedtime.  . Semaglutide,0.25 or 0.5MG/DOS, (OZEMPIC, 0.25 OR 0.5 MG/DOSE,) 2 MG/1.5ML SOPN Inject 0.5 mg into the skin once a week.  . zolpidem (AMBIEN) 5 MG tablet Take 5 mg by mouth at bedtime as needed for sleep.  . [DISCONTINUED] insulin detemir (LEVEMIR) 100 UNIT/ML injection Inject 35 Units into the skin at bedtime.   . [DISCONTINUED] insulin detemir (LEVEMIR) 100 UNIT/ML injection Inject 0.35 mLs (35 Units total) into the skin daily.  . [DISCONTINUED] Semaglutide (OZEMPIC) 0.25 or 0.5 MG/DOSE SOPN Inject 0.5 mg into the skin once a week.  . [DISCONTINUED] Semaglutide,0.25 or 0.5MG/DOS, (OZEMPIC, 0.25 OR 0.5 MG/DOSE,) 2 MG/1.5ML SOPN Inject  0.5 mg into the skin once a week.   No facility-administered encounter medications on file as of 07/15/2018.     Surgical History: Past Surgical History:  Procedure Laterality Date  . CESAREAN SECTION    . COLONOSCOPY WITH PROPOFOL N/A 09/11/2015   Procedure: COLONOSCOPY WITH PROPOFOL;  Surgeon: Lucilla Lame, MD;  Location: [facility] ENDOSCOPY;  Service: Endoscopy;  Laterality: N/A;    Medical History: Past Medical History:  Diagnosis Date  . Asthma   . COPD exacerbation ([place]) 04/12/2016  . Diabetes mellitus without complication ([place])   . Hyperlipemia   .  Hypertension     Family History: Family History  Problem Relation Age of Onset  . Diabetes Mother   . Hypertension Mother   . Hypertension Father   . Diabetes Father     Social History   Socioeconomic History  . Marital status: Married    Spouse name: Not on file  . Number of children: Not on file  . Years of education: Not on file  . Highest education level: Not on file  Occupational History  . Not on file  Social Needs  . Financial resource strain: Not on file  . Food insecurity:    Worry: Not on file    Inability: Not on file  . Transportation needs:    Medical: Not on file    Non-medical: Not on file  Tobacco Use  . Smoking status: Current Every Day Smoker    Packs/day: 1.00    Years: 2.00    Pack years: 2.00    Types: Cigarettes  . Smokeless tobacco: Never Used  Substance and Sexual Activity  . Alcohol use: Yes    Alcohol/week: 2.0 standard drinks    Types: 2 Cans of beer per week    Comment: occasionally  . Drug use: No  . Sexual activity: Never    Birth control/protection: Abstinence  Lifestyle  . Physical activity:    Days per week: Not on file    Minutes per session: Not on file  . Stress: Not on file  Relationships  . Social connections:    Talks on phone: Not on file    Gets together: Not on file    Attends religious service: Not on file    Active member of club or organization: Not on file    Attends meetings of clubs or organizations: Not on file    Relationship status: Not on file  . Intimate partner violence:    Fear of current or ex partner: Not on file    Emotionally abused: Not on file    Physically abused: Not on file    Forced sexual activity: Not on file  Other Topics Concern  . Not on file  Social History Narrative  . Not on file      Review of Systems  Constitutional: Negative for activity change, appetite change and unexpected weight change.  HENT: Negative for congestion, postnasal drip, rhinorrhea, sinus pressure,  sinus pain, sore throat and voice change.   Eyes: Negative.   Respiratory: Negative for cough, choking, shortness of breath and wheezing.   Cardiovascular: Negative for chest pain and palpitations.       Blood pressure elevated.   Gastrointestinal: Positive for nausea. Negative for abdominal pain, constipation, diarrhea and vomiting.  Endocrine:       Improved blood sugars since her last visit.   Musculoskeletal: Negative for arthralgias, back pain and myalgias.  Skin: Negative for rash.  Allergic/Immunologic: Positive for environmental allergies.  Neurological: Negative for dizziness, light-headedness and headaches.  Hematological: Negative for adenopathy. Does not bruise/bleed easily.  Psychiatric/Behavioral: Positive for dysphoric mood. The patient is nervous/anxious.     Today's Vitals   07/15/18 1137 07/15/18 1139 07/15/18 1200  BP: (!) 177/87 (!) 174/80 (!) 156/86  Pulse: 61    Resp: 16    SpO2: 95%    Weight: 172 lb (78 kg)    Height: '5\' 1"'  (1.549 m)     Body mass index is 32.5 kg/m.  Physical Exam Vitals signs and nursing note reviewed.  Constitutional:      Appearance: Normal appearance. She is well-developed.  HENT:     Head: Normocephalic and atraumatic.     Nose: Nose normal.  Eyes:     Conjunctiva/sclera: Conjunctivae normal.     Pupils: Pupils are equal, round, and reactive to light.  Neck:     Musculoskeletal: Normal range of motion and neck supple.     Thyroid: Thyromegaly present.     Vascular: No carotid bruit or JVD.     Trachea: No tracheal deviation.  Cardiovascular:     Rate and Rhythm: Normal rate and regular rhythm.     Heart sounds: Normal heart sounds.  Pulmonary:     Effort: Pulmonary effort is normal. No respiratory distress.     Breath sounds: Normal breath sounds. No wheezing.  Chest:     Chest wall: No tenderness.     Breasts: Breasts are symmetrical.   Abdominal:     General: Bowel sounds are normal.     Palpations: Abdomen is  soft.     Tenderness: There is no abdominal tenderness.  Musculoskeletal: Normal range of motion.  Lymphadenopathy:     Cervical: No cervical adenopathy.  Skin:    General: Skin is warm and dry.     Capillary Refill: Capillary refill takes 2 to 3 seconds.  Neurological:     Mental Status: She is alert and oriented to person, place, and time. Mental status is at baseline.     Cranial Nerves: No cranial nerve deficit.     Deep Tendon Reflexes: Reflexes normal.  Psychiatric:        Behavior: Behavior normal.        Thought Content: Thought content normal.        Judgment: Judgment normal.    Assessment/Plan:  1. Uncontrolled type 2 diabetes mellitus with hyperglycemia (HCC) - POCT HgB A1C significant elevation to 9.5 today. Patient has run out of both levemir and ozempic and has been out for some time. Renew these medications. Discussed how important it is to stay on prescribed medications at all itmes and not to let them run out without refills or contacting the office. She voiced understanding. She should continue all other diabetic medications as prescribed . - insulin detemir (LEVEMIR) 100 UNIT/ML injection; Inject 0.35 mLs (35 Units total) into the skin daily.  Dispense: 10 mL; Refill: 5 - Semaglutide,0.25 or 0.5MG/DOS, (OZEMPIC, 0.25 OR 0.5 MG/DOSE,) 2 MG/1.5ML SOPN; Inject 0.5 mg into the skin once a week.  Dispense: 1 pen; Refill: 5  2. Goiter diffuse, nontoxic Will ultrasound thyroid for further evaluation.  - US Soft Tissue Head/Neck; Future  3. Essential hypertension Stable. No changes to bp medication today.   4. Mixed hyperlipidemia Continue pravastatin as prescribed   General Counseling: Rudy verbalizes understanding of the findings of todays visit and agrees with plan of treatment. I have discussed any further diagnostic evaluation that may be needed or  ordered today. We also reviewed her medications today. she has been encouraged to call the office with any questions  or concerns that should arise related to todays visit.  Diabetes Counseling:  1. Addition of ACE inh/ ARB'S for nephroprotection. Microalbumin is updated  2. Diabetic foot care, prevention of complications. Podiatry consult 3. Exercise and lose weight.  4. Diabetic eye examination, Diabetic eye exam is updated  5. Monitor blood sugar closlely. nutrition counseling.  6. Sign and symptoms of hypoglycemia including shaking sweating,confusion and headaches.  Counseling: Adherence of Medical Therapy: The patient understands that it is the responsibility of the patient to complete all prescribed medications, all recommended testing, including but not limited to, laboratory studies and imaging. The patient further understands the need to keep all scheduled follow-up visits and to inform the office immediately of any changes in their medical condition. The patient understands that the success of treatment in large part depends on the patient's willingness to complete the therapeutic regimen and to work in partnership with the designated health-care providers.   This patient was seen by Leretha Pol FNP Collaboration with Dr Lavera Guise as a part of collaborative care agreement  Orders Placed This Encounter  Procedures  . US Soft Tissue Head/Neck  . POCT HgB A1C    Meds ordered this encounter  Medications  . DISCONTD: Semaglutide,0.25 or 0.5MG/DOS, (OZEMPIC, 0.25 OR 0.5 MG/DOSE,) 2 MG/1.5ML SOPN    Sig: Inject 0.5 mg into the skin once a week.    Dispense:  1 pen    Refill:  5    Order Specific Question:   Supervising Provider    Answer:   Lavera Guise [[id number]]  . DISCONTD: insulin detemir (LEVEMIR) 100 UNIT/ML injection    Sig: Inject 0.35 mLs (35 Units total) into the skin daily.    Dispense:  10 mL    Refill:  5    Order Specific Question:   Supervising Provider    Answer:   Lavera Guise [[id number]]  . insulin detemir (LEVEMIR) 100 UNIT/ML injection    Sig: Inject 0.35 mLs (35 Units  total) into the skin daily.    Dispense:  10 mL    Refill:  5    Order Specific Question:   Supervising Provider    Answer:   Lavera Guise [[id number]]  . Semaglutide,0.25 or 0.5MG/DOS, (OZEMPIC, 0.25 OR 0.5 MG/DOSE,) 2 MG/1.5ML SOPN    Sig: Inject 0.5 mg into the skin once a week.    Dispense:  1 pen    Refill:  5    Order Specific Question:   Supervising Provider    Answer:   Lavera Guise [[id number]]    Time spent: 51 Minutes      Dr Lavera Guise Internal medicine

## 2018-07-21 DIAGNOSIS — E040 Nontoxic diffuse goiter: Secondary | ICD-10-CM | POA: Insufficient documentation

## 2018-07-23 ENCOUNTER — Other Ambulatory Visit: Payer: Self-pay

## 2018-07-29 ENCOUNTER — Ambulatory Visit: Payer: Self-pay | Admitting: Nurse Practitioner

## 2018-07-30 ENCOUNTER — Other Ambulatory Visit: Payer: Self-pay

## 2018-08-05 ENCOUNTER — Ambulatory Visit: Payer: Self-pay | Admitting: Nurse Practitioner

## 2018-08-13 ENCOUNTER — Other Ambulatory Visit: Payer: Self-pay

## 2018-08-13 ENCOUNTER — Ambulatory Visit (INDEPENDENT_AMBULATORY_CARE_PROVIDER_SITE_OTHER): Payer: Medicare Other

## 2018-08-13 DIAGNOSIS — E040 Nontoxic diffuse goiter: Secondary | ICD-10-CM

## 2018-08-17 ENCOUNTER — Ambulatory Visit: Payer: Self-pay | Admitting: Nurse Practitioner

## 2018-09-22 ENCOUNTER — Encounter: Payer: Self-pay | Admitting: Nurse Practitioner

## 2018-09-27 ENCOUNTER — Encounter: Payer: Self-pay | Admitting: Nurse Practitioner

## 2018-09-27 ENCOUNTER — Other Ambulatory Visit: Payer: Self-pay

## 2018-09-27 ENCOUNTER — Ambulatory Visit (INDEPENDENT_AMBULATORY_CARE_PROVIDER_SITE_OTHER): Payer: Medicare HMO | Admitting: Nurse Practitioner

## 2018-09-27 VITALS — Ht 61.0 in | Wt 160.0 lb

## 2018-09-27 DIAGNOSIS — I10 Essential (primary) hypertension: Secondary | ICD-10-CM | POA: Diagnosis not present

## 2018-09-27 DIAGNOSIS — E1165 Type 2 diabetes mellitus with hyperglycemia: Secondary | ICD-10-CM | POA: Diagnosis not present

## 2018-09-27 DIAGNOSIS — E040 Nontoxic diffuse goiter: Secondary | ICD-10-CM | POA: Diagnosis not present

## 2018-09-27 DIAGNOSIS — Z0001 Encounter for general adult medical examination with abnormal findings: Secondary | ICD-10-CM

## 2018-09-27 DIAGNOSIS — E782 Mixed hyperlipidemia: Secondary | ICD-10-CM

## 2018-09-27 MED ORDER — SEMAGLUTIDE(0.25 OR 0.5MG/DOS) 2 MG/1.5ML ~~LOC~~ SOPN: 0.5000 mg | PEN_INJECTOR | SUBCUTANEOUS | 5 refills | Status: DC

## 2018-09-27 NOTE — Progress Notes (Signed)
[facility] [place], [place] [address]  Internal MEDICINE  Telephone Visit  Patient Name: Dawn Alvarez  [id number]  [id number]  Date of Service: 09/27/2018  I connected with the patient at 12:01pm by telephone and verified the patients identity using two identifiers.   I discussed the limitations, risks, security and privacy concerns of performing an evaluation and management service by telephone and the availability of in person appointments. I also discussed with the patient that there may be a patient responsible charge related to the service.  The patient expressed understanding and agrees to proceed.    Chief Complaint  Patient presents with  . Telephone Screen    PHONE VISIT  . Telephone Assessment  . Medicare Wellness    AWV   . Labs Only    Ultrasound results  . Hypertension  . Hyperlipidemia  . Diabetes  . Quality Metric Gaps    foot, eye, lipid panel, A1C due    The patient has been contacted via telephone for follow up visit due to concerns for spread of novel coronavirus. She has had ultrasound of her thyroid since she was last seen. She had been having trouble swallowing and feeling pressure in her throat. She had enlarged thyroid gland. This ultrasound indicates nodules on the left and right side of the thyroid, both measuring over 2cm in diameter. She will need to have urgent referral to endocrinology for further evaluation and treatment. She states that her daughter has been checking her blood sugars every morning. She is not sure of the numbers, but her daughter reassures her that numbers have been "good." she is due to have routine, fasting labs done, and will need to have diabetic foot exam at her next visit .      Current Medication: Outpatient Encounter Medications as of 09/27/2018  Medication Sig  . albuterol (PROVENTIL HFA;VENTOLIN HFA) 108 (90 Base) MCG/ACT inhaler Inhale 2 puffs into the lungs every 4 (four) hours as needed  for wheezing or shortness of breath.  Marland Kitchen amLODipine (NORVASC) 10 MG tablet Take 1 tablet (10 mg total) by mouth daily.  . bisoprolol-hydrochlorothiazide (ZIAC) 5-6.25 MG tablet Take 1 tablet by mouth daily.  . Cholecalciferol (VITAMIN D3) 2000 units TABS Take 1 tablet by mouth daily.  Marland Kitchen gemfibrozil (LOPID) 600 MG tablet Take 1 tablet (600 mg total) by mouth 2 (two) times daily before a meal.  . glucose blood (ACCU-CHEK SMARTVIEW) test strip 1 each by Other route as needed for other. Use as directed twice a day diag E11.65  . glyBURIDE (DIABETA) 5 MG tablet Take 5 mg by mouth 2 (two) times daily with a meal.  . hydrALAZINE (APRESOLINE) 50 MG tablet Take 1 tablet (50 mg total) by mouth 3 (three) times daily.  . insulin detemir (LEVEMIR) 100 UNIT/ML injection Inject 0.35 mLs (35 Units total) into the skin daily.  . Insulin Pen Needle (PEN NEEDLES) 31G X 8 MM MISC Use as directed with insulin E11.65  . isosorbide mononitrate (IMDUR) 30 MG 24 hr tablet Take 1 tablet (30 mg total) by mouth daily.  . Lancets Misc. (ACCU-CHEK MULTICLIX LANCET DEV) KIT by Does not apply route. Use as directed twice a day diag E11.65  . lisinopril (PRINIVIL,ZESTRIL) 20 MG tablet Take 20 mg by mouth daily.  . metFORMIN (GLUCOPHAGE) 500 MG tablet Take 1,000 mg by mouth 2 (two) times daily with a meal. Take 2 tab bid for diabetes  . omega-3 acid ethyl esters (LOVAZA) 1 g  capsule Take 1 capsule (1 g total) by mouth 2 (two) times daily.  Marland Kitchen omeprazole (PRILOSEC) 40 MG capsule Take 40 mg by mouth daily.  . pravastatin (PRAVACHOL) 40 MG tablet Take 40 mg by mouth at bedtime.  . Semaglutide,0.25 or 0.5MG/DOS, (OZEMPIC, 0.25 OR 0.5 MG/DOSE,) 2 MG/1.5ML SOPN Inject 0.5 mg into the skin once a week.  . zolpidem (AMBIEN) 5 MG tablet Take 5 mg by mouth at bedtime as needed for sleep.  . [DISCONTINUED] Semaglutide,0.25 or 0.5MG/DOS, (OZEMPIC, 0.25 OR 0.5 MG/DOSE,) 2 MG/1.5ML SOPN Inject 0.5 mg into the skin once a week.  Marland Kitchen azithromycin  (ZITHROMAX) 250 MG tablet z-pack - take as directed for 5 days (Patient not taking: Reported on 09/27/2018)   No facility-administered encounter medications on file as of 09/27/2018.     Surgical History: Past Surgical History:  Procedure Laterality Date  . CESAREAN SECTION    . COLONOSCOPY WITH PROPOFOL N/A 09/11/2015   Procedure: COLONOSCOPY WITH PROPOFOL;  Surgeon: Lucilla Lame, MD;  Location: [facility] ENDOSCOPY;  Service: Endoscopy;  Laterality: N/A;    Medical History: Past Medical History:  Diagnosis Date  . Asthma   . COPD exacerbation ([place]) 04/12/2016  . Diabetes mellitus without complication ([place])   . Hyperlipemia   . Hypertension     Family History: Family History  Problem Relation Age of Onset  . Diabetes Mother   . Hypertension Mother   . Hypertension Father   . Diabetes Father     Social History   Socioeconomic History  . Marital status: Married    Spouse name: Not on file  . Number of children: Not on file  . Years of education: Not on file  . Highest education level: Not on file  Occupational History  . Not on file  Social Needs  . Financial resource strain: Not on file  . Food insecurity:    Worry: Not on file    Inability: Not on file  . Transportation needs:    Medical: Not on file    Non-medical: Not on file  Tobacco Use  . Smoking status: Former Smoker    Packs/day: 1.00    Years: 2.00    Pack years: 2.00    Types: Cigarettes  . Smokeless tobacco: Never Used  Substance and Sexual Activity  . Alcohol use: Yes    Alcohol/week: 2.0 standard drinks    Types: 2 Cans of beer per week    Comment: occasionally  . Drug use: No  . Sexual activity: Never    Birth control/protection: Abstinence  Lifestyle  . Physical activity:    Days per week: Not on file    Minutes per session: Not on file  . Stress: Not on file  Relationships  . Social connections:    Talks on phone: Not on file    Gets together: Not on file    Attends religious service:  Not on file    Active member of club or organization: Not on file    Attends meetings of clubs or organizations: Not on file    Relationship status: Not on file  . Intimate partner violence:    Fear of current or ex partner: Not on file    Emotionally abused: Not on file    Physically abused: Not on file    Forced sexual activity: Not on file  Other Topics Concern  . Not on file  Social History Narrative  . Not on file      Review of  Systems  Constitutional: Negative for activity change, appetite change and unexpected weight change.  HENT: Positive for trouble swallowing. Negative for congestion, postnasal drip, rhinorrhea, sinus pressure, sinus pain, sore throat and voice change.   Respiratory: Negative for cough, choking, shortness of breath and wheezing.   Cardiovascular: Negative for chest pain and palpitations.  Gastrointestinal: Negative for abdominal pain, constipation, diarrhea, nausea and vomiting.  Endocrine: Negative for cold intolerance, heat intolerance, polydipsia and polyuria.       Improved blood sugars since her last visit. Enlarged thyroid gland.   Musculoskeletal: Negative for arthralgias, back pain and myalgias.  Skin: Negative for rash.  Allergic/Immunologic: Positive for environmental allergies.  Neurological: Negative for dizziness, light-headedness and headaches.  Hematological: Negative for adenopathy. Does not bruise/bleed easily.  Psychiatric/Behavioral: Positive for dysphoric mood. The patient is not nervous/anxious.     Today's Vitals   09/27/18 1131  Weight: 160 lb (72.6 kg)  Height: '5\' 1"'  (1.549 m)    Observation/Objective:  The patient is alert, and oriented, and pleasant. She answers all questions appropriately. Her breathing is non labored. She is in no acute distress at this time.   Depression screen [facility] 2/9 09/27/2018 07/15/2018 04/15/2018 12/24/2017 09/22/2017  Decreased Interest 0 0 0 0 0  Down, Depressed, Hopeless 0 0 0 0 0  PHQ - 2  Score 0 0 0 0 0    Functional Status Survey: Is the patient deaf or have difficulty hearing?: No Does the patient have difficulty seeing, even when wearing glasses/contacts?: No Does the patient have difficulty concentrating, remembering, or making decisions?: No Does the patient have difficulty walking or climbing stairs?: No Does the patient have difficulty dressing or bathing?: No Does the patient have difficulty doing errands alone such as visiting a doctor's office or shopping?: No  MMSE - [place] Exam 09/27/2018 09/22/2017  Orientation to time 5 5  Orientation to Place 5 5  Registration 3 3  Attention/ Calculation 5 5  Recall 3 3  Language- name 2 objects 2 2  Language- repeat 1 1  Language- follow 3 step command 3 3  Language- read & follow direction 1 1  Write a sentence 1 1  Copy design 1 1  Total score 30 30    Fall Risk  09/27/2018 07/15/2018 04/15/2018 12/24/2017 09/22/2017  Falls in the past year? 0 0 0 No No   Assessment/Plan:  1. Encounter for general adult medical examination with abnormal findings Annual health maintenance exam today.   2. Goiter diffuse, nontoxic Thyroid ultrasound showing bilateral thyroid nodules measuring greater than 2cm in diameter. Will refer urgently to endocrinology provider for further evaluation and treatment.  - Ambulatory referral to Endocrinology  3. Uncontrolled type 2 diabetes mellitus with hyperglycemia (HCC) No changes in diabetic medication today. Will check HgbA1c and get foot exam at next in-office visit.  - Semaglutide,0.25 or 0.5MG/DOS, (OZEMPIC, 0.25 OR 0.5 MG/DOSE,) 2 MG/1.5ML SOPN; Inject 0.5 mg into the skin once a week.  Dispense: 1 pen; Refill: 5  4. Essential hypertension Generally stable. Continue bp medication as prescribed.   5. Mixed hyperlipidemia Check fasting lipid panel and adjust statin dosing as indicated .  General Counseling: Dawn Alvarez verbalizes understanding of the findings of today's phone  visit and agrees with plan of treatment. I have discussed any further diagnostic evaluation that may be needed or ordered today. We also reviewed her medications today. she has been encouraged to call the office with any questions or concerns that should arise  related to todays visit.  Diabetes Counseling:  1. Addition of ACE inh/ ARB'S for nephroprotection. Microalbumin is updated  2. Diabetic foot care, prevention of complications. Podiatry consult 3. Exercise and lose weight.  4. Diabetic eye examination, Diabetic eye exam is updated  5. Monitor blood sugar closlely. nutrition counseling.  6. Sign and symptoms of hypoglycemia including shaking sweating,confusion and headaches.  This patient was seen by Leretha Pol FNP Collaboration with Dr Lavera Guise as a part of collaborative care agreement  Orders Placed This Encounter  Procedures  . Ambulatory referral to Endocrinology    Meds ordered this encounter  Medications  . Semaglutide,0.25 or 0.5MG/DOS, (OZEMPIC, 0.25 OR 0.5 MG/DOSE,) 2 MG/1.5ML SOPN    Sig: Inject 0.5 mg into the skin once a week.    Dispense:  1 pen    Refill:  5    Order Specific Question:   Supervising Provider    Answer:   Lavera Guise [[id number]]    Time spent: 86 Minutes    Dr Lavera Guise Internal medicine

## 2018-10-06 ENCOUNTER — Other Ambulatory Visit: Payer: Self-pay | Admitting: Nurse Practitioner

## 2018-10-06 DIAGNOSIS — E559 Vitamin D deficiency, unspecified: Secondary | ICD-10-CM | POA: Diagnosis not present

## 2018-10-06 DIAGNOSIS — G4709 Other insomnia: Secondary | ICD-10-CM | POA: Diagnosis not present

## 2018-10-06 DIAGNOSIS — E6609 Other obesity due to excess calories: Secondary | ICD-10-CM | POA: Diagnosis not present

## 2018-10-06 DIAGNOSIS — E782 Mixed hyperlipidemia: Secondary | ICD-10-CM | POA: Diagnosis not present

## 2018-10-06 DIAGNOSIS — J45998 Other asthma: Secondary | ICD-10-CM | POA: Diagnosis not present

## 2018-10-06 DIAGNOSIS — E1165 Type 2 diabetes mellitus with hyperglycemia: Secondary | ICD-10-CM | POA: Diagnosis not present

## 2018-10-06 DIAGNOSIS — E785 Hyperlipidemia, unspecified: Secondary | ICD-10-CM | POA: Diagnosis not present

## 2018-10-06 DIAGNOSIS — E042 Nontoxic multinodular goiter: Secondary | ICD-10-CM | POA: Diagnosis not present

## 2018-10-06 DIAGNOSIS — Z0001 Encounter for general adult medical examination with abnormal findings: Secondary | ICD-10-CM | POA: Diagnosis not present

## 2018-10-06 DIAGNOSIS — Z6832 Body mass index (BMI) 32.0-32.9, adult: Secondary | ICD-10-CM | POA: Diagnosis not present

## 2018-10-06 DIAGNOSIS — K219 Gastro-esophageal reflux disease without esophagitis: Secondary | ICD-10-CM | POA: Diagnosis not present

## 2018-10-06 DIAGNOSIS — I10 Essential (primary) hypertension: Secondary | ICD-10-CM | POA: Diagnosis not present

## 2018-10-07 DIAGNOSIS — E785 Hyperlipidemia, unspecified: Secondary | ICD-10-CM | POA: Diagnosis not present

## 2018-10-07 DIAGNOSIS — J45998 Other asthma: Secondary | ICD-10-CM | POA: Diagnosis not present

## 2018-10-07 DIAGNOSIS — E6609 Other obesity due to excess calories: Secondary | ICD-10-CM | POA: Diagnosis not present

## 2018-10-07 DIAGNOSIS — I10 Essential (primary) hypertension: Secondary | ICD-10-CM | POA: Diagnosis not present

## 2018-10-07 DIAGNOSIS — E1165 Type 2 diabetes mellitus with hyperglycemia: Secondary | ICD-10-CM | POA: Diagnosis not present

## 2018-10-07 DIAGNOSIS — K219 Gastro-esophageal reflux disease without esophagitis: Secondary | ICD-10-CM | POA: Diagnosis not present

## 2018-10-07 DIAGNOSIS — E042 Nontoxic multinodular goiter: Secondary | ICD-10-CM | POA: Diagnosis not present

## 2018-10-07 DIAGNOSIS — G4709 Other insomnia: Secondary | ICD-10-CM | POA: Diagnosis not present

## 2018-10-07 LAB — T4, FREE: Free T4: 1.36 ng/dL (ref 0.82–1.77)

## 2018-10-07 LAB — T3: T3, Total: 75 ng/dL (ref 71–180)

## 2018-10-07 LAB — SPECIMEN STATUS REPORT

## 2018-10-07 LAB — VITAMIN D 25 HYDROXY (VIT D DEFICIENCY, FRACTURES): Vit D, 25-Hydroxy: 18.1 ng/mL — ABNORMAL LOW (ref 30.0–100.0)

## 2018-10-08 LAB — THYROID ANTIBODIES
Thyroglobulin Antibody: <1 IU/mL (ref 0.0–0.9)
Thyroperoxidase Ab SerPl-aCnc: 12 IU/mL (ref 0–34)

## 2018-10-08 LAB — SPECIMEN STATUS REPORT

## 2018-10-12 DIAGNOSIS — E6609 Other obesity due to excess calories: Secondary | ICD-10-CM | POA: Diagnosis not present

## 2018-10-12 DIAGNOSIS — E041 Nontoxic single thyroid nodule: Secondary | ICD-10-CM | POA: Diagnosis not present

## 2018-10-12 DIAGNOSIS — J45998 Other asthma: Secondary | ICD-10-CM | POA: Diagnosis not present

## 2018-10-12 DIAGNOSIS — G4709 Other insomnia: Secondary | ICD-10-CM | POA: Diagnosis not present

## 2018-10-12 DIAGNOSIS — E042 Nontoxic multinodular goiter: Secondary | ICD-10-CM | POA: Diagnosis not present

## 2018-10-12 DIAGNOSIS — I10 Essential (primary) hypertension: Secondary | ICD-10-CM | POA: Diagnosis not present

## 2018-10-12 DIAGNOSIS — K219 Gastro-esophageal reflux disease without esophagitis: Secondary | ICD-10-CM | POA: Diagnosis not present

## 2018-10-12 DIAGNOSIS — E785 Hyperlipidemia, unspecified: Secondary | ICD-10-CM | POA: Diagnosis not present

## 2018-10-12 DIAGNOSIS — E1165 Type 2 diabetes mellitus with hyperglycemia: Secondary | ICD-10-CM | POA: Diagnosis not present

## 2018-10-15 ENCOUNTER — Encounter: Payer: Self-pay | Admitting: Nurse Practitioner

## 2018-10-21 DIAGNOSIS — E042 Nontoxic multinodular goiter: Secondary | ICD-10-CM | POA: Diagnosis not present

## 2018-10-21 DIAGNOSIS — J45998 Other asthma: Secondary | ICD-10-CM | POA: Diagnosis not present

## 2018-10-21 DIAGNOSIS — E6609 Other obesity due to excess calories: Secondary | ICD-10-CM | POA: Diagnosis not present

## 2018-10-21 DIAGNOSIS — E785 Hyperlipidemia, unspecified: Secondary | ICD-10-CM | POA: Diagnosis not present

## 2018-10-21 DIAGNOSIS — K219 Gastro-esophageal reflux disease without esophagitis: Secondary | ICD-10-CM | POA: Diagnosis not present

## 2018-10-21 DIAGNOSIS — E1165 Type 2 diabetes mellitus with hyperglycemia: Secondary | ICD-10-CM | POA: Diagnosis not present

## 2018-10-21 DIAGNOSIS — G4709 Other insomnia: Secondary | ICD-10-CM | POA: Diagnosis not present

## 2018-10-21 DIAGNOSIS — I10 Essential (primary) hypertension: Secondary | ICD-10-CM | POA: Diagnosis not present

## 2018-10-28 DIAGNOSIS — G4709 Other insomnia: Secondary | ICD-10-CM | POA: Diagnosis not present

## 2018-10-28 DIAGNOSIS — J45998 Other asthma: Secondary | ICD-10-CM | POA: Diagnosis not present

## 2018-10-28 DIAGNOSIS — E1165 Type 2 diabetes mellitus with hyperglycemia: Secondary | ICD-10-CM | POA: Diagnosis not present

## 2018-10-28 DIAGNOSIS — E042 Nontoxic multinodular goiter: Secondary | ICD-10-CM | POA: Diagnosis not present

## 2018-10-28 DIAGNOSIS — K219 Gastro-esophageal reflux disease without esophagitis: Secondary | ICD-10-CM | POA: Diagnosis not present

## 2018-10-28 DIAGNOSIS — Z6832 Body mass index (BMI) 32.0-32.9, adult: Secondary | ICD-10-CM | POA: Diagnosis not present

## 2018-10-28 DIAGNOSIS — I10 Essential (primary) hypertension: Secondary | ICD-10-CM | POA: Diagnosis not present

## 2018-10-28 DIAGNOSIS — E6609 Other obesity due to excess calories: Secondary | ICD-10-CM | POA: Diagnosis not present

## 2018-10-28 DIAGNOSIS — E785 Hyperlipidemia, unspecified: Secondary | ICD-10-CM | POA: Diagnosis not present

## 2018-11-29 ENCOUNTER — Ambulatory Visit: Payer: Medicare HMO | Admitting: Nurse Practitioner

## 2018-11-29 DIAGNOSIS — I10 Essential (primary) hypertension: Secondary | ICD-10-CM | POA: Diagnosis not present

## 2018-11-29 DIAGNOSIS — K219 Gastro-esophageal reflux disease without esophagitis: Secondary | ICD-10-CM | POA: Diagnosis not present

## 2018-11-29 DIAGNOSIS — E6609 Other obesity due to excess calories: Secondary | ICD-10-CM | POA: Diagnosis not present

## 2018-11-29 DIAGNOSIS — E1165 Type 2 diabetes mellitus with hyperglycemia: Secondary | ICD-10-CM | POA: Diagnosis not present

## 2018-11-29 DIAGNOSIS — J45998 Other asthma: Secondary | ICD-10-CM | POA: Diagnosis not present

## 2018-11-29 DIAGNOSIS — E042 Nontoxic multinodular goiter: Secondary | ICD-10-CM | POA: Diagnosis not present

## 2018-11-29 DIAGNOSIS — E785 Hyperlipidemia, unspecified: Secondary | ICD-10-CM | POA: Diagnosis not present

## 2018-12-07 DIAGNOSIS — Z6832 Body mass index (BMI) 32.0-32.9, adult: Secondary | ICD-10-CM | POA: Diagnosis not present

## 2018-12-07 DIAGNOSIS — K219 Gastro-esophageal reflux disease without esophagitis: Secondary | ICD-10-CM | POA: Diagnosis not present

## 2018-12-07 DIAGNOSIS — E1165 Type 2 diabetes mellitus with hyperglycemia: Secondary | ICD-10-CM | POA: Diagnosis not present

## 2018-12-07 DIAGNOSIS — G4709 Other insomnia: Secondary | ICD-10-CM | POA: Diagnosis not present

## 2018-12-07 DIAGNOSIS — E785 Hyperlipidemia, unspecified: Secondary | ICD-10-CM | POA: Diagnosis not present

## 2018-12-07 DIAGNOSIS — E042 Nontoxic multinodular goiter: Secondary | ICD-10-CM | POA: Diagnosis not present

## 2018-12-07 DIAGNOSIS — I10 Essential (primary) hypertension: Secondary | ICD-10-CM | POA: Diagnosis not present

## 2018-12-07 DIAGNOSIS — E6609 Other obesity due to excess calories: Secondary | ICD-10-CM | POA: Diagnosis not present

## 2018-12-07 DIAGNOSIS — J45998 Other asthma: Secondary | ICD-10-CM | POA: Diagnosis not present

## 2018-12-20 ENCOUNTER — Telehealth: Payer: Self-pay | Admitting: Internal Medicine

## 2018-12-22 ENCOUNTER — Telehealth: Payer: Self-pay

## 2018-12-22 NOTE — Telephone Encounter (Signed)
Left message and asked pt to call and schedule appt for a1c. Beth

## 2018-12-27 DIAGNOSIS — R079 Chest pain, unspecified: Secondary | ICD-10-CM | POA: Diagnosis not present

## 2018-12-27 DIAGNOSIS — I213 ST elevation (STEMI) myocardial infarction of unspecified site: Secondary | ICD-10-CM | POA: Diagnosis not present

## 2018-12-27 DIAGNOSIS — R55 Syncope and collapse: Secondary | ICD-10-CM | POA: Diagnosis not present

## 2018-12-27 DIAGNOSIS — R404 Transient alteration of awareness: Secondary | ICD-10-CM | POA: Diagnosis not present

## 2018-12-27 DIAGNOSIS — I10 Essential (primary) hypertension: Secondary | ICD-10-CM | POA: Diagnosis not present

## 2018-12-28 ENCOUNTER — Other Ambulatory Visit: Payer: Self-pay

## 2018-12-28 ENCOUNTER — Encounter: Payer: Self-pay | Admitting: Emergency Medicine

## 2018-12-28 ENCOUNTER — Emergency Department: Payer: Medicare HMO

## 2018-12-28 ENCOUNTER — Emergency Department
Admission: EM | Admit: 2018-12-28 | Discharge: 2018-12-28 | Disposition: A | Discharge status: Home / Self Care | Payer: Medicare HMO | Attending: Emergency Medicine | Admitting: Emergency Medicine

## 2018-12-28 DIAGNOSIS — I10 Essential (primary) hypertension: Secondary | ICD-10-CM | POA: Insufficient documentation

## 2018-12-28 DIAGNOSIS — E860 Dehydration: Secondary | ICD-10-CM | POA: Diagnosis not present

## 2018-12-28 DIAGNOSIS — E876 Hypokalemia: Secondary | ICD-10-CM | POA: Diagnosis not present

## 2018-12-28 DIAGNOSIS — J45909 Unspecified asthma, uncomplicated: Secondary | ICD-10-CM | POA: Insufficient documentation

## 2018-12-28 DIAGNOSIS — Z79899 Other long term (current) drug therapy: Secondary | ICD-10-CM | POA: Insufficient documentation

## 2018-12-28 DIAGNOSIS — J449 Chronic obstructive pulmonary disease, unspecified: Secondary | ICD-10-CM | POA: Insufficient documentation

## 2018-12-28 DIAGNOSIS — F10929 Alcohol use, unspecified with intoxication, unspecified: Secondary | ICD-10-CM | POA: Insufficient documentation

## 2018-12-28 DIAGNOSIS — Z87891 Personal history of nicotine dependence: Secondary | ICD-10-CM | POA: Insufficient documentation

## 2018-12-28 DIAGNOSIS — E119 Type 2 diabetes mellitus without complications: Secondary | ICD-10-CM | POA: Insufficient documentation

## 2018-12-28 DIAGNOSIS — R55 Syncope and collapse: Secondary | ICD-10-CM

## 2018-12-28 DIAGNOSIS — Z794 Long term (current) use of insulin: Secondary | ICD-10-CM | POA: Diagnosis not present

## 2018-12-28 DIAGNOSIS — Z03818 Encounter for observation for suspected exposure to other biological agents ruled out: Secondary | ICD-10-CM | POA: Diagnosis not present

## 2018-12-28 DIAGNOSIS — R51 Headache: Secondary | ICD-10-CM | POA: Diagnosis present

## 2018-12-28 LAB — TROPONIN I (HIGH SENSITIVITY)
Troponin I (High Sensitivity): 14 ng/L (ref <18)
Troponin I (High Sensitivity): 15 ng/L (ref <18)

## 2018-12-28 LAB — URINALYSIS, COMPLETE (UACMP) WITH MICROSCOPIC
Bilirubin Urine: NEGATIVE
Glucose, UA: 150 mg/dL — AB
Hgb urine dipstick: NEGATIVE
Ketones, ur: NEGATIVE mg/dL
Leukocytes,Ua: NEGATIVE
Nitrite: NEGATIVE
Protein, ur: NEGATIVE mg/dL
Specific Gravity, Urine: 1.003 — ABNORMAL LOW (ref 1.005–1.030)
pH: 5 (ref 5.0–8.0)

## 2018-12-28 LAB — CBC WITH DIFFERENTIAL/PLATELET
Abs Immature Granulocytes: 0.04 10*3/uL (ref 0.00–0.07)
Basophils Absolute: 0.1 10*3/uL (ref 0.0–0.1)
Basophils Relative: 1 %
Eosinophils Absolute: 0.2 10*3/uL (ref 0.0–0.5)
Eosinophils Relative: 2 %
HCT: 39.7 % (ref 36.0–46.0)
Hemoglobin: 13.4 g/dL (ref 12.0–15.0)
Immature Granulocytes: 0 %
Lymphocytes Relative: 40 %
Lymphs Abs: 3.8 10*3/uL (ref 0.7–4.0)
MCH: 28.2 pg (ref 26.0–34.0)
MCHC: 33.8 g/dL (ref 30.0–36.0)
MCV: 83.6 fL (ref 80.0–100.0)
Monocytes Absolute: 0.6 10*3/uL (ref 0.1–1.0)
Monocytes Relative: 7 %
Neutro Abs: 4.7 10*3/uL (ref 1.7–7.7)
Neutrophils Relative %: 50 %
Platelets: 264 10*3/uL (ref 150–400)
RBC: 4.75 MIL/uL (ref 3.87–5.11)
RDW: 13 % (ref 11.5–15.5)
WBC: 9.4 10*3/uL (ref 4.0–10.5)
nRBC: 0 % (ref 0.0–0.2)

## 2018-12-28 LAB — COMPREHENSIVE METABOLIC PANEL
ALT: 17 U/L (ref 0–44)
AST: 18 U/L (ref 15–41)
Albumin: 4 g/dL (ref 3.5–5.0)
Alkaline Phosphatase: 47 U/L (ref 38–126)
Anion gap: 16 — ABNORMAL HIGH (ref 5–15)
BUN: 13 mg/dL (ref 8–23)
CO2: 19 mmol/L — ABNORMAL LOW (ref 22–32)
Calcium: 9.6 mg/dL (ref 8.9–10.3)
Chloride: 100 mmol/L (ref 98–111)
Creatinine, Ser: 0.75 mg/dL (ref 0.44–1.00)
GFR calc Af Amer: >60 mL/min (ref >60)
GFR calc non Af Amer: >60 mL/min (ref >60)
Glucose, Bld: 228 mg/dL — ABNORMAL HIGH (ref 70–99)
Potassium: 2.8 mmol/L — ABNORMAL LOW (ref 3.5–5.1)
Sodium: 135 mmol/L (ref 135–145)
Total Bilirubin: 0.4 mg/dL (ref 0.3–1.2)
Total Protein: 7.8 g/dL (ref 6.5–8.1)

## 2018-12-28 LAB — ETHANOL: Alcohol, Ethyl (B): 85 mg/dL — ABNORMAL HIGH (ref <10)

## 2018-12-28 LAB — MAGNESIUM: Magnesium: 1.8 mg/dL (ref 1.7–2.4)

## 2018-12-28 MED ORDER — SODIUM CHLORIDE 0.9 % IV BOLUS
1000.0000 mL | Freq: Once | INTRAVENOUS | Status: AC
  Administered 2018-12-28: 1000 mL via INTRAVENOUS

## 2018-12-28 MED ORDER — POTASSIUM CHLORIDE CRYS ER 20 MEQ PO TBCR
40.0000 meq | EXTENDED_RELEASE_TABLET | Freq: Once | ORAL | Status: AC
  Administered 2018-12-28: 40 meq via ORAL
  Filled 2018-12-28: qty 2

## 2018-12-28 MED ORDER — POTASSIUM CHLORIDE 10 MEQ/100ML IV SOLN
10.0000 meq | Freq: Once | INTRAVENOUS | Status: AC
  Administered 2018-12-28: 10 meq via INTRAVENOUS
  Filled 2018-12-28: qty 100

## 2018-12-28 NOTE — ED Triage Notes (Signed)
Pt to room 2 from home for syncopal episode; EMS reports pt was outside and drinking few beers then had syncopal episode witnessed by friends; pt was inside home & awake upon arrival of EMS

## 2018-12-28 NOTE — ED Notes (Signed)
Pt up to room commode for cc urine; pt ambulates well with stand by assist

## 2018-12-28 NOTE — ED Notes (Signed)
Pt sitting uprite watching TV; eating crackers and drinking ginger ale

## 2018-12-28 NOTE — ED Notes (Signed)
Pt assisted into gown & on card monitor; pt alert to self & place; st that she wants to leave now but then agrees to be evaluated by provider after explanation given why she is here; resp even/unlab, lungs clear, apical audible & regular, +BS, abd soft/nondist/nontender; +PP, -edema; pt denies any c/o at present, st that she drank 3 beers tonight

## 2018-12-28 NOTE — ED Provider Notes (Signed)
[facility] Emergency Department Provider Note  ____________________________________________  Time seen: Approximately 12:36 AM  I have reviewed the triage vital signs and the nursing notes.   HISTORY  Chief Complaint Loss of Consciousness   HPI Dawn Alvarez is a 63 y.o. female with a history of diabetes, COPD, hypertension, hyperlipidemia who presents for evaluation of syncope.  Patient reports that she was in her usual state of health.  She was sitting on the porch having some beers at a friend's house.  She reports having 3 beers.  She was feeling overheated and started feeling dizzy.  She tried to get up and had a syncopal event.  She reports collapsing to the ground.  She is complaining of a mild pain in the back of her head which started after the syncopal event.  She denies chest pain or shortness of breath, fever, cough, abdominal pain, back pain, neck pain, any extremity pain, vomiting, diarrhea, dysuria or hematuria.  Patient denies any drug use.  She denies any history of CAD.  She reports prior history of syncopal episodes.   Past Medical History:  Diagnosis Date  . Asthma   . COPD exacerbation ([place]) 04/12/2016  . Diabetes mellitus without complication ([place])   . Hyperlipemia   . Hypertension     Patient Active Problem List   Diagnosis Date Noted  . 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 Acute upper respiratory infection 04/15/2018  . Mixed hyperlipidemia 01/10/2018  . Dysuria 10/11/2017  . Headache, acute 06/24/2017  . Type II diabetes mellitus, uncontrolled ([place]) 06/23/2017  . Essential hypertension 06/23/2017  . Syncope 04/12/2016  . Dizziness and giddiness 04/12/2016  . Acute bronchitis 04/12/2016  . COPD exacerbation ([place]) 04/12/2016  . Tobacco abuse counseling 04/12/2016  . Atypical chest pain 04/11/2016  . Encounter for general adult medical examination with abnormal findings   . Benign neoplasm of sigmoid colon     Past  Surgical History:  Procedure Laterality Date  . CESAREAN SECTION    . COLONOSCOPY WITH PROPOFOL N/A 09/11/2015   Procedure: COLONOSCOPY WITH PROPOFOL;  Surgeon: Lucilla Lame, MD;  Location: [facility] ENDOSCOPY;  Service: Endoscopy;  Laterality: N/A;    Prior to Admission medications   Medication Sig Start Date End Date Taking? Authorizing Provider  albuterol (PROVENTIL HFA;VENTOLIN HFA) 108 (90 Base) MCG/ACT inhaler Inhale 2 puffs into the lungs every 4 (four) hours as needed for wheezing or shortness of breath. 04/12/16   Theodoro Grist, MD  amLODipine (NORVASC) 10 MG tablet Take 1 tablet (10 mg total) by mouth daily. 06/23/17   [name], NP  azithromycin (ZITHROMAX) 250 MG tablet z-pack - take as directed for 5 days Patient not taking: Reported on 09/27/2018 04/15/18   [name], NP  bisoprolol-hydrochlorothiazide (ZIAC) 5-6.25 MG tablet Take 1 tablet by mouth daily. 09/22/17   [name], NP  Cholecalciferol (VITAMIN D3) 2000 units TABS Take 1 tablet by mouth daily.    [provider]  gemfibrozil (LOPID) 600 MG tablet Take 1 tablet (600 mg total) by mouth 2 (two) times daily before a meal. 04/12/16   Theodoro Grist, MD  glucose blood (ACCU-CHEK SMARTVIEW) test strip 1 each by Other route as needed for other. Use as directed twice a day diag E11.65    [provider]  glyBURIDE (DIABETA) 5 MG tablet Take 5 mg by mouth 2 (two) times daily with a meal.    [provider]  hydrALAZINE (APRESOLINE) 50 MG tablet Take  1 tablet (50 mg total) by mouth 3 (three) times daily. 02/04/18   [name], NP  insulin detemir (LEVEMIR) 100 UNIT/ML injection Inject 0.35 mLs (35 Units total) into the skin daily. 07/15/18   [name], NP  Insulin Pen Needle (PEN NEEDLES) 31G X 8 MM MISC Use as directed with insulin E11.65 03/30/18   [name], NP  isosorbide mononitrate (IMDUR) 30 MG 24 hr tablet Take 1 tablet (30 mg total) by mouth daily. 04/12/16    Theodoro Grist, MD  Lancets Misc. (ACCU-CHEK MULTICLIX LANCET DEV) KIT by Does not apply route. Use as directed twice a day diag E11.65    [provider]  lisinopril (PRINIVIL,ZESTRIL) 20 MG tablet Take 20 mg by mouth daily.    [provider]  metFORMIN (GLUCOPHAGE) 500 MG tablet Take 1,000 mg by mouth 2 (two) times daily with a meal. Take 2 tab bid for diabetes    [provider]  omega-3 acid ethyl esters (LOVAZA) 1 g capsule Take 1 capsule (1 g total) by mouth 2 (two) times daily. 04/12/16   Theodoro Grist, MD  omeprazole (PRILOSEC) 40 MG capsule Take 40 mg by mouth daily.    [provider]  pravastatin (PRAVACHOL) 40 MG tablet Take 40 mg by mouth at bedtime.    [provider]  Semaglutide,0.25 or 0.5MG/DOS, (OZEMPIC, 0.25 OR 0.5 MG/DOSE,) 2 MG/1.5ML SOPN Inject 0.5 mg into the skin once a week. 09/27/18   [name], NP  zolpidem (AMBIEN) 5 MG tablet Take 5 mg by mouth at bedtime as needed for sleep.    [provider]    Allergies Patient has no known allergies.  Family History  Problem Relation Age of Onset  . Diabetes Mother   . Hypertension Mother   . Hypertension Father   . Diabetes Father     Social History Social History   Tobacco Use  . Smoking status: Former Smoker    Packs/day: 1.00    Years: 2.00    Pack years: 2.00    Types: Cigarettes  . Smokeless tobacco: Never Used  Substance Use Topics  . Alcohol use: Yes    Alcohol/week: 2.0 standard drinks    Types: 2 Cans of beer per week    Comment: occasionally  . Drug use: No    Review of Systems  Constitutional: Negative for fever. + syncope Eyes: Negative for visual changes. ENT: Negative for sore throat. Neck: No neck pain  Cardiovascular: Negative for chest pain. Respiratory: Negative for shortness of breath. Gastrointestinal: Negative for abdominal pain, vomiting or diarrhea. Genitourinary: Negative for dysuria. Musculoskeletal: Negative  for back pain. Skin: Negative for rash. Neurological: Negative for headaches, weakness or numbness. Psych: No SI or HI  ____________________________________________   PHYSICAL EXAM:  VITAL SIGNS: ED Triage Vitals  Enc Vitals Group     BP 12/28/18 0012 (!) 205/93     Pulse Rate 12/28/18 0012 (!) 104     Resp 12/28/18 0012 20     Temp 12/28/18 0012 98 F (36.7 C)     Temp Source 12/28/18 0012 Oral     SpO2 12/28/18 0012 100 %     Weight --      Height --      Head Circumference --      Peak Flow --      Pain Score 12/28/18 0013 0     Pain Loc --      Pain Edu? --  Excl. in [place]? --     Constitutional: Alert and oriented. No acute distress. Does not appear intoxicated. HEENT Head: Normocephalic and atraumatic. Face: No facial bony tenderness. Stable midface Ears: No hemotympanum bilaterally. No Battle sign Eyes: No eye injury. PERRL. No raccoon eyes Nose: Nontender. No epistaxis. No rhinorrhea Mouth/Throat: Mucous membranes are moist. No oropharyngeal blood. No dental injury. Airway patent without stridor. Normal voice. Neck: no C-collar. No midline c-spine tenderness.  Cardiovascular: Tachycardic with regular rhythm. Normal and symmetric distal pulses are present in all extremities. Pulmonary/Chest: Chest wall is stable and nontender to palpation/compression. Normal respiratory effort. Breath sounds are normal. No crepitus.  Abdominal: Soft, nontender, non distended. Musculoskeletal: Nontender with normal full range of motion in all extremities. No deformities. No thoracic or lumbar midline spinal tenderness. Pelvis is stable. Skin: Skin is warm, dry and intact. No abrasions or contutions. Psychiatric: Speech and behavior are appropriate. Neurological: Normal speech and language. Moves all extremities to command. No gross focal neurologic deficits are appreciated.  Glascow Coma Score: 4 - Opens eyes on own 6 - Follows simple motor commands 5 - Alert and oriented  GCS: 15   ____________________________________________   LABS (all labs ordered are listed, but only abnormal results are displayed)  Labs Reviewed  COMPREHENSIVE METABOLIC PANEL - Abnormal; Notable for the following components:      Result Value   Potassium 2.8 (*)    CO2 19 (*)    Glucose, Bld 228 (*)    Anion gap 16 (*)    All other components within normal limits  ETHANOL - Abnormal; Notable for the following components:   Alcohol, Ethyl (B) 85 (*)    All other components within normal limits  URINALYSIS, COMPLETE (UACMP) WITH MICROSCOPIC - Abnormal; Notable for the following components:   Color, Urine STRAW (*)    APPearance CLEAR (*)    Specific Gravity, Urine 1.003 (*)    Glucose, UA 150 (*)    Bacteria, UA RARE (*)    All other components within normal limits  URINE CULTURE  CBC WITH DIFFERENTIAL/PLATELET  MAGNESIUM  TROPONIN I (HIGH SENSITIVITY)  TROPONIN I (HIGH SENSITIVITY)   ____________________________________________  EKG  ED ECG REPORT I, Rudene Re, the attending physician, personally viewed and interpreted this ECG.  Sinus tachycardia, rate of 103, normal intervals, anterior ST elevations with no reciprocal changes, normal axis.  Unchanged from prior. ____________________________________________  RADIOLOGY  I have personally reviewed the images performed during this visit and I agree with the Radiologist's read.   Interpretation by Radiologist:  Ct Head Wo Contrast  Result Date: 12/28/2018 CLINICAL DATA:  Syncopal episode EXAM: CT HEAD WITHOUT CONTRAST CT CERVICAL SPINE WITHOUT CONTRAST TECHNIQUE: Multidetector CT imaging of the head and cervical spine was performed following the standard protocol without intravenous contrast. Multiplanar CT image reconstructions of the cervical spine were also generated. COMPARISON:  CT 06/20/2017 FINDINGS: CT HEAD FINDINGS Brain: No acute territorial infarction, hemorrhage, or intracranial mass. Moderate  hypodensity in the white matter consistent with small vessel ischemic change. 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 Mild atrophy. Stable ventricle size Vascular: No hyperdense vessels.  Carotid vascular calcification Skull: Normal. Negative for fracture or focal lesion. Sinuses/Orbits: No acute finding. Other: None CT CERVICAL SPINE FINDINGS Alignment: Straightening of the cervical spine. No subluxation. Facet alignment within normal limits Skull base and vertebrae: No acute fracture. No primary bone lesion or focal pathologic process. Soft tissues and spinal canal: No prevertebral fluid or swelling. No visible canal  hematoma. Disc levels:  Mild degenerative changes C5-C6 and C6-C7. Upper chest: Enlarged thyroid gland. 16 mm hypodense left lobe nodule with peripheral calcification. Other: None IMPRESSION: 1. No CT evidence for acute intracranial abnormality. Small vessel ischemic changes of the white matter chronic lacunar infarcts in the thalamus 2. Straightening of the cervical spine without acute osseous abnormality 3. Thyromegaly. 16 mm hypodense nodule left lobe of thyroid which may be correlated with nonemergent ultrasound as clinically indicated Electronically Signed   By: Donavan Foil M.D.   On: 12/28/2018 01:38   Ct Cervical Spine Wo Contrast  Result Date: 12/28/2018 CLINICAL DATA:  Syncopal episode EXAM: CT HEAD WITHOUT CONTRAST CT CERVICAL SPINE WITHOUT CONTRAST TECHNIQUE: Multidetector CT imaging of the head and cervical spine was performed following the standard protocol without intravenous contrast. Multiplanar CT image reconstructions of the cervical spine were also generated. COMPARISON:  CT 06/20/2017 FINDINGS: CT HEAD FINDINGS Brain: No acute territorial infarction, hemorrhage, or intracranial mass. Moderate hypodensity in the white matter consistent with small vessel ischemic change. 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 Mild atrophy. Stable ventricle size  Vascular: No hyperdense vessels.  Carotid vascular calcification Skull: Normal. Negative for fracture or focal lesion. Sinuses/Orbits: No acute finding. Other: None CT CERVICAL SPINE FINDINGS Alignment: Straightening of the cervical spine. No subluxation. Facet alignment within normal limits Skull base and vertebrae: No acute fracture. No primary bone lesion or focal pathologic process. Soft tissues and spinal canal: No prevertebral fluid or swelling. No visible canal hematoma. Disc levels:  Mild degenerative changes C5-C6 and C6-C7. Upper chest: Enlarged thyroid gland. 16 mm hypodense left lobe nodule with peripheral calcification. Other: None IMPRESSION: 1. No CT evidence for acute intracranial abnormality. Small vessel ischemic changes of the white matter chronic lacunar infarcts in the thalamus 2. Straightening of the cervical spine without acute osseous abnormality 3. Thyromegaly. 16 mm hypodense nodule left lobe of thyroid which may be correlated with nonemergent ultrasound as clinically indicated Electronically Signed   By: Donavan Foil M.D.   On: 12/28/2018 01:38      ____________________________________________   PROCEDURES  Procedure(s) performed: None Procedures Critical Care performed:  None ____________________________________________   INITIAL IMPRESSION / ASSESSMENT AND PLAN / ED COURSE   63 y.o. female with a history of diabetes, COPD, hypertension, hyperlipidemia who presents for evaluation of syncope.  Patient reports being on the porch for several hours, was very hot outside.  She was drinking beers.  She felt overheated and dizzy prior to her syncopal event.  She feels back to normal at this time but is complaining of a mild occipital headache.  There is no traumatic findings on exam.  Her EKG shows no evidence of dysrhythmias or ischemia.  Differential diagnosis including orthostasis versus dehydration versus anemia versus alcohol intoxication versus cardiac dysrhythmias.  Will  check orthostatic vital signs, labs, head CT and CT cervical spine.  Will give IV fluids.  Will monitor on telemetry.    _________________________ 4:01 AM on 12/28/2018 -----------------------------------------  Orthostatics negative.  Labs consistent with mild dehydration and hypokalemia.  Patient received IV fluids.  Potassium was supplemented p.o. and IV.  Patient with positive alcohol level and mild anion gap.  She was monitored on telemetry for 4 hours with no episodes of dysrhythmias.  She continues to feel back to baseline with no complaints at this time.  CT head and cervical spine with no traumatic findings.  Troponin x2-.  UA negative for UTI.  Recommended increase oral hydration and avoidance  of alcohol for the next 48 hours, follow-up with PCP.  Discussed my standard return precautions.    As part of my medical decision making, I reviewed the following data within the [place] notes reviewed and incorporated, Labs reviewed , EKG interpreted , Old EKG reviewed, Old chart reviewed, Radiograph reviewed  Notes from prior ED visits and [place] Controlled Substance Database   Patient was evaluated in Emergency Department today for the symptoms described in the history of present illness. Patient was evaluated in the context of the global COVID-19 pandemic, which necessitated consideration that the patient might be at risk for infection with the SARS-CoV-2 virus that causes COVID-19. Institutional protocols and algorithms that pertain to the evaluation of patients at risk for COVID-19 are in a state of rapid change based on information released by regulatory bodies including the CDC and federal and state organizations. These policies and algorithms were followed during the patient's care in the ED.   ____________________________________________   FINAL CLINICAL IMPRESSION(S) / ED DIAGNOSES   Final diagnoses:  Syncope, unspecified syncope type  Dehydration   Hypokalemia  Alcoholic intoxication with complication ([place])      NEW MEDICATIONS STARTED DURING THIS VISIT:  ED Discharge Orders    None       Note:  This document was prepared using Dragon voice recognition software and may include unintentional dictation errors.    Alfred Levins, [place], MD 12/28/18 [phone]

## 2018-12-28 NOTE — ED Notes (Signed)
Pt to CT via stretcher accomp by CT tech 

## 2018-12-28 NOTE — ED Notes (Signed)
Pt talking on phone with sister; friend at bedside

## 2018-12-29 ENCOUNTER — Ambulatory Visit (INDEPENDENT_AMBULATORY_CARE_PROVIDER_SITE_OTHER): Payer: Medicare HMO | Admitting: Adult Health

## 2018-12-29 ENCOUNTER — Encounter: Payer: Self-pay | Admitting: Adult Health

## 2018-12-29 VITALS — BP 185/110 | HR 77 | Temp 97.8°F | Resp 16 | Ht 60.0 in | Wt 176.0 lb

## 2018-12-29 DIAGNOSIS — E782 Mixed hyperlipidemia: Secondary | ICD-10-CM

## 2018-12-29 DIAGNOSIS — I10 Essential (primary) hypertension: Secondary | ICD-10-CM

## 2018-12-29 DIAGNOSIS — Z9119 Patient's noncompliance with other medical treatment and regimen: Secondary | ICD-10-CM

## 2018-12-29 DIAGNOSIS — Z91199 Patient's noncompliance with other medical treatment and regimen due to unspecified reason: Secondary | ICD-10-CM

## 2018-12-29 DIAGNOSIS — E1165 Type 2 diabetes mellitus with hyperglycemia: Secondary | ICD-10-CM | POA: Diagnosis not present

## 2018-12-29 LAB — URINE CULTURE: Culture: <10000 — AB

## 2018-12-29 MED ORDER — BISOPROLOL-HYDROCHLOROTHIAZIDE 10-6.25 MG PO TABS: 1.0000 | ORAL_TABLET | Freq: Every day | ORAL | 2 refills | Status: DC

## 2018-12-29 NOTE — Progress Notes (Signed)
[facility] [place], [place] [address]  Internal MEDICINE  Office Visit Note  Patient Name: Dawn Alvarez  [id number]  [id number]  Date of Service: 12/29/2018     Chief Complaint  Patient presents with  . Hypertension    bp running high, hospital follow up   . Quality Metric Gaps    lipid panel, foot exam , eye exam     HPI Pt is here for recent hospital follow up. Pt went to the ER on 7/26.  She was drinking beers on the porch with friends and had a witnessed syncopal event.  She reports feeling overheated and dizzy before the episode.  She was checked out in the ER and found to be dehydrated and hypokalemic.  She was discharged home that same day.    Current Medication: Outpatient Encounter Medications as of 12/29/2018  Medication Sig  . insulin detemir (LEVEMIR) 100 UNIT/ML injection Inject 0.35 mLs (35 Units total) into the skin daily.  . Insulin Pen Needle (PEN NEEDLES) 31G X 8 MM MISC Use as directed with insulin E11.65  . Semaglutide,0.25 or 0.5MG/DOS, (OZEMPIC, 0.25 OR 0.5 MG/DOSE,) 2 MG/1.5ML SOPN Inject 0.5 mg into the skin once a week.  . zolpidem (AMBIEN) 5 MG tablet Take 5 mg by mouth at bedtime as needed for sleep.  Marland Kitchen amLODipine (NORVASC) 10 MG tablet Take 1 tablet (10 mg total) by mouth daily.  . bisoprolol-hydrochlorothiazide (ZIAC) 5-6.25 MG tablet Take 1 tablet by mouth daily.  . Cholecalciferol (VITAMIN D3) 2000 units TABS Take 1 tablet by mouth daily.  Marland Kitchen gemfibrozil (LOPID) 600 MG tablet Take 1 tablet (600 mg total) by mouth 2 (two) times daily before a meal.  . glucose blood (ACCU-CHEK SMARTVIEW) test strip 1 each by Other route as needed for other. Use as directed twice a day diag E11.65  . glyBURIDE (DIABETA) 5 MG tablet Take 5 mg by mouth 2 (two) times daily with a meal.  . hydrALAZINE (APRESOLINE) 50 MG tablet Take 1 tablet (50 mg total) by mouth 3 (three) times daily.  . isosorbide mononitrate (IMDUR) 30 MG 24 hr tablet Take  1 tablet (30 mg total) by mouth daily.  . Lancets Misc. (ACCU-CHEK MULTICLIX LANCET DEV) KIT by Does not apply route. Use as directed twice a day diag E11.65  . lisinopril (PRINIVIL,ZESTRIL) 20 MG tablet Take 20 mg by mouth daily.  Marland Kitchen omega-3 acid ethyl esters (LOVAZA) 1 g capsule Take 1 capsule (1 g total) by mouth 2 (two) times daily.  Marland Kitchen omeprazole (PRILOSEC) 40 MG capsule Take 40 mg by mouth daily.  . pravastatin (PRAVACHOL) 40 MG tablet Take 40 mg by mouth at bedtime.  . [DISCONTINUED] albuterol (PROVENTIL HFA;VENTOLIN HFA) 108 (90 Base) MCG/ACT inhaler Inhale 2 puffs into the lungs every 4 (four) hours as needed for wheezing or shortness of breath.  . [DISCONTINUED] azithromycin (ZITHROMAX) 250 MG tablet z-pack - take as directed for 5 days (Patient not taking: Reported on 09/27/2018)  . [DISCONTINUED] metFORMIN (GLUCOPHAGE) 500 MG tablet Take 1,000 mg by mouth 2 (two) times daily with a meal. Take 2 tab bid for diabetes   No facility-administered encounter medications on file as of 12/29/2018.     Surgical History: Past Surgical History:  Procedure Laterality Date  . CESAREAN SECTION    . COLONOSCOPY WITH PROPOFOL N/A 09/11/2015   Procedure: COLONOSCOPY WITH PROPOFOL;  Surgeon: Lucilla Lame, MD;  Location: [facility] ENDOSCOPY;  Service: Endoscopy;  Laterality: N/A;  Medical History: Past Medical History:  Diagnosis Date  . Asthma   . COPD exacerbation ([place]) 04/12/2016  . Diabetes mellitus without complication ([place])   . Hyperlipemia   . Hypertension     Family History: Family History  Problem Relation Age of Onset  . Diabetes Mother   . Hypertension Mother   . Hypertension Father   . Diabetes Father     Social History   Socioeconomic History  . Marital status: Married    Spouse name: Not on file  . Number of children: Not on file  . Years of education: Not on file  . Highest education level: Not on file  Occupational History  . Not on file  Social Needs  . Financial  resource strain: Not on file  . Food insecurity    Worry: Not on file    Inability: Not on file  . Transportation needs    Medical: Not on file    Non-medical: Not on file  Tobacco Use  . Smoking status: Former Smoker    Packs/day: 1.00    Years: 2.00    Pack years: 2.00    Types: Cigarettes  . Smokeless tobacco: Never Used  Substance and Sexual Activity  . Alcohol use: Yes    Alcohol/week: 2.0 standard drinks    Types: 2 Cans of beer per week    Comment: occasionally  . Drug use: No  . Sexual activity: Never    Birth control/protection: Abstinence  Lifestyle  . Physical activity    Days per week: Not on file    Minutes per session: Not on file  . Stress: Not on file  Relationships  . Social Herbalist on phone: Not on file    Gets together: Not on file    Attends religious service: Not on file    Active member of club or organization: Not on file    Attends meetings of clubs or organizations: Not on file    Relationship status: Not on file  . Intimate partner violence    Fear of current or ex partner: Not on file    Emotionally abused: Not on file    Physically abused: Not on file    Forced sexual activity: Not on file  Other Topics Concern  . Not on file  Social History Narrative  . Not on file      Review of Systems  Constitutional: Negative for chills, fatigue and unexpected weight change.  HENT: Negative for congestion, rhinorrhea, sneezing and sore throat.   Eyes: Negative for photophobia, pain and redness.  Respiratory: Negative for cough, chest tightness and shortness of breath.   Cardiovascular: Negative for chest pain and palpitations.  Gastrointestinal: Negative for abdominal pain, constipation, diarrhea, nausea and vomiting.  Endocrine: Negative.   Genitourinary: Negative for dysuria and frequency.  Musculoskeletal: Negative for arthralgias, back pain, joint swelling and neck pain.  Skin: Negative for rash.  Allergic/Immunologic:  Negative.   Neurological: Negative for tremors and numbness.  Hematological: Negative for adenopathy. Does not bruise/bleed easily.  Psychiatric/Behavioral: Negative for behavioral problems and sleep disturbance. The patient is not nervous/anxious.     Vital Signs: BP (!) 178/126   Pulse 70   Temp 97.8 F (36.6 C)   Resp 16   Ht 5' (1.524 m)   Wt 176 lb (79.8 kg)   SpO2 96%   BMI 34.37 kg/m    Physical Exam Vitals signs and nursing note reviewed.  Constitutional:  General: She is not in acute distress.    Appearance: She is well-developed. She is not diaphoretic.  HENT:     Head: Normocephalic and atraumatic.     Mouth/Throat:     Pharynx: No oropharyngeal exudate.  Eyes:     Pupils: Pupils are equal, round, and reactive to light.  Neck:     Musculoskeletal: Normal range of motion and neck supple.     Thyroid: No thyromegaly.     Vascular: No JVD.     Trachea: No tracheal deviation.  Cardiovascular:     Rate and Rhythm: Normal rate and regular rhythm.     Heart sounds: Normal heart sounds. No murmur. No friction rub. No gallop.   Pulmonary:     Effort: Pulmonary effort is normal. No respiratory distress.     Breath sounds: Normal breath sounds. No wheezing or rales.  Chest:     Chest wall: No tenderness.  Abdominal:     Palpations: Abdomen is soft.     Tenderness: There is no abdominal tenderness. There is no guarding.  Musculoskeletal: Normal range of motion.  Lymphadenopathy:     Cervical: No cervical adenopathy.  Skin:    General: Skin is warm and dry.  Neurological:     Mental Status: She is alert and oriented to person, place, and time.     Cranial Nerves: No cranial nerve deficit.  Psychiatric:        Behavior: Behavior normal.        Thought Content: Thought content normal.        Judgment: Judgment normal.    Assessment/Plan: 1. Essential hypertension BP elevated today. Pt is not sure if she took her medications.  WE discussed the importance  of taking medications as prescribed.     - Lipid Panel With LDL/HDL Ratio  2. Uncontrolled type 2 diabetes mellitus with hyperglycemia (HCC) Take medications as directed.   3. Mixed hyperlipidemia Will get lipid panel to evaluate.   4. Compliance poor Pt's compliance with medications is in question.   General Counseling: Loza verbalizes understanding of the findings of todays visit and agrees with plan of treatment. I have discussed any further diagnostic evaluation that may be needed or ordered today. We also reviewed her medications today. she has been encouraged to call the office with any questions or concerns that should arise related to todays visit.     No orders of the defined types were placed in this encounter.     I have reviewed all medical records from hospital follow up including radiology reports and consults from other physicians. Appropriate follow up diagnostics will be scheduled as needed. Patient/ Family understands the plan of treatment.   Time spent 20 minutes.   Orson Gear AGNP-C Internal Medicine

## 2019-01-05 ENCOUNTER — Other Ambulatory Visit: Payer: Self-pay

## 2019-01-05 ENCOUNTER — Encounter: Payer: Self-pay | Admitting: Adult Health

## 2019-01-05 ENCOUNTER — Ambulatory Visit (INDEPENDENT_AMBULATORY_CARE_PROVIDER_SITE_OTHER): Payer: Medicare HMO | Admitting: Adult Health

## 2019-01-05 VITALS — BP 146/108 | HR 55 | Resp 16 | Ht 60.0 in | Wt 171.0 lb

## 2019-01-05 DIAGNOSIS — Z91199 Patient's noncompliance with other medical treatment and regimen due to unspecified reason: Secondary | ICD-10-CM

## 2019-01-05 DIAGNOSIS — Z9119 Patient's noncompliance with other medical treatment and regimen: Secondary | ICD-10-CM

## 2019-01-05 DIAGNOSIS — E1165 Type 2 diabetes mellitus with hyperglycemia: Secondary | ICD-10-CM

## 2019-01-05 DIAGNOSIS — I10 Essential (primary) hypertension: Secondary | ICD-10-CM

## 2019-01-05 MED ORDER — LISINOPRIL 20 MG PO TABS: 20.0000 mg | ORAL_TABLET | Freq: Every day | ORAL | 3 refills | Status: DC

## 2019-01-05 MED ORDER — OZEMPIC (0.25 OR 0.5 MG/DOSE) 2 MG/1.5ML ~~LOC~~ SOPN: 0.5000 mg | PEN_INJECTOR | SUBCUTANEOUS | 5 refills | Status: DC

## 2019-01-05 MED ORDER — INSULIN DETEMIR 100 UNIT/ML ~~LOC~~ SOLN: 35.0000 [IU] | Freq: Every day | SUBCUTANEOUS | 5 refills | Status: DC

## 2019-01-05 MED ORDER — AMLODIPINE BESYLATE 10 MG PO TABS: 10.0000 mg | ORAL_TABLET | Freq: Every day | ORAL | 5 refills | Status: DC

## 2019-01-05 MED ORDER — HYDRALAZINE HCL 50 MG PO TABS: 50.0000 mg | ORAL_TABLET | Freq: Three times a day (TID) | ORAL | 3 refills | Status: DC

## 2019-01-05 NOTE — Progress Notes (Signed)
[facility] [place], [place] [address]  Internal MEDICINE  Office Visit Note  Patient Name: Dawn Alvarez  [id number]  [id number]  Date of Service: 01/05/2019  Chief Complaint  Patient presents with  . Medical Management of Chronic Issues    1 week follow up, still having elevated bp and headache   . Hypertension    HPI  Pt is here for follow up on HTN.  Her blood pressure remains elevated, and she has been having headaches.  She reports that she has been taking the [facility], however she did not have her other BP meds at her husbands house where she is staying.  She has only been taking the ziac.  We discussed medication compliance, and the risks of stroke with HTN.     Current Medication: Outpatient Encounter Medications as of 01/05/2019  Medication Sig  . amLODipine (NORVASC) 10 MG tablet Take 1 tablet (10 mg total) by mouth daily.  . bisoprolol-hydrochlorothiazide (ZIAC) 10-6.25 MG tablet Take 1 tablet by mouth daily.  . Cholecalciferol (VITAMIN D3) 2000 units TABS Take 1 tablet by mouth daily.  Marland Kitchen gemfibrozil (LOPID) 600 MG tablet Take 1 tablet (600 mg total) by mouth 2 (two) times daily before a meal.  . glucose blood (ACCU-CHEK SMARTVIEW) test strip 1 each by Other route as needed for other. Use as directed twice a day diag E11.65  . glyBURIDE (DIABETA) 5 MG tablet Take 5 mg by mouth 2 (two) times daily with a meal.  . hydrALAZINE (APRESOLINE) 50 MG tablet Take 1 tablet (50 mg total) by mouth 3 (three) times daily.  . insulin detemir (LEVEMIR) 100 UNIT/ML injection Inject 0.35 mLs (35 Units total) into the skin daily.  . Insulin Pen Needle (PEN NEEDLES) 31G X 8 MM MISC Use as directed with insulin E11.65  . isosorbide mononitrate (IMDUR) 30 MG 24 hr tablet Take 1 tablet (30 mg total) by mouth daily.  . Lancets Misc. (ACCU-CHEK MULTICLIX LANCET DEV) KIT by Does not apply route. Use as directed twice a day diag E11.65  . lisinopril (ZESTRIL) 20 MG tablet  Take 1 tablet (20 mg total) by mouth daily.  Marland Kitchen omega-3 acid ethyl esters (LOVAZA) 1 g capsule Take 1 capsule (1 g total) by mouth 2 (two) times daily.  Marland Kitchen omeprazole (PRILOSEC) 40 MG capsule Take 40 mg by mouth daily.  . pravastatin (PRAVACHOL) 40 MG tablet Take 40 mg by mouth at bedtime.  . Semaglutide,0.25 or 0.5MG/DOS, (OZEMPIC, 0.25 OR 0.5 MG/DOSE,) 2 MG/1.5ML SOPN Inject 0.5 mg into the skin once a week.  . zolpidem (AMBIEN) 5 MG tablet Take 5 mg by mouth at bedtime as needed for sleep.  . [DISCONTINUED] amLODipine (NORVASC) 10 MG tablet Take 1 tablet (10 mg total) by mouth daily.  . [DISCONTINUED] hydrALAZINE (APRESOLINE) 50 MG tablet Take 1 tablet (50 mg total) by mouth 3 (three) times daily.  . [DISCONTINUED] insulin detemir (LEVEMIR) 100 UNIT/ML injection Inject 0.35 mLs (35 Units total) into the skin daily.  . [DISCONTINUED] lisinopril (PRINIVIL,ZESTRIL) 20 MG tablet Take 20 mg by mouth daily.  . [DISCONTINUED] Semaglutide,0.25 or 0.5MG/DOS, (OZEMPIC, 0.25 OR 0.5 MG/DOSE,) 2 MG/1.5ML SOPN Inject 0.5 mg into the skin once a week.   No facility-administered encounter medications on file as of 01/05/2019.     Surgical History: Past Surgical History:  Procedure Laterality Date  . CESAREAN SECTION    . COLONOSCOPY WITH PROPOFOL N/A 09/11/2015   Procedure: COLONOSCOPY WITH PROPOFOL;  Surgeon: Evangeline Gula  Allen Norris, MD;  Location: [facility];  Service: Endoscopy;  Laterality: N/A;    Medical History: Past Medical History:  Diagnosis Date  . Asthma   . COPD exacerbation ([place]) 04/12/2016  . Diabetes mellitus without complication ([place])   . Hyperlipemia   . Hypertension     Family History: Family History  Problem Relation Age of Onset  . Diabetes Mother   . Hypertension Mother   . Hypertension Father   . Diabetes Father     Social History   Socioeconomic History  . Marital status: Married    Spouse name: Not on file  . Number of children: Not on file  . Years of education: Not on  file  . Highest education level: Not on file  Occupational History  . Not on file  Social Needs  . Financial resource strain: Not on file  . Food insecurity    Worry: Not on file    Inability: Not on file  . Transportation needs    Medical: Not on file    Non-medical: Not on file  Tobacco Use  . Smoking status: Former Smoker    Packs/day: 1.00    Years: 2.00    Pack years: 2.00    Types: Cigarettes  . Smokeless tobacco: Never Used  Substance and Sexual Activity  . Alcohol use: Yes    Alcohol/week: 2.0 standard drinks    Types: 2 Cans of beer per week    Comment: occasionally  . Drug use: No  . Sexual activity: Never    Birth control/protection: Abstinence  Lifestyle  . Physical activity    Days per week: Not on file    Minutes per session: Not on file  . Stress: Not on file  Relationships  . Social Herbalist on phone: Not on file    Gets together: Not on file    Attends religious service: Not on file    Active member of club or organization: Not on file    Attends meetings of clubs or organizations: Not on file    Relationship status: Not on file  . Intimate partner violence    Fear of current or ex partner: Not on file    Emotionally abused: Not on file    Physically abused: Not on file    Forced sexual activity: Not on file  Other Topics Concern  . Not on file  Social History Narrative  . Not on file      Review of Systems  Constitutional: Negative for chills, fatigue and unexpected weight change.  HENT: Negative for congestion, rhinorrhea, sneezing and sore throat.   Eyes: Negative for photophobia, pain and redness.  Respiratory: Negative for cough, chest tightness and shortness of breath.   Cardiovascular: Negative for chest pain and palpitations.  Gastrointestinal: Negative for abdominal pain, constipation, diarrhea, nausea and vomiting.  Endocrine: Negative.   Genitourinary: Negative for dysuria and frequency.  Musculoskeletal: Negative  for arthralgias, back pain, joint swelling and neck pain.  Skin: Negative for rash.  Allergic/Immunologic: Negative.   Neurological: Negative for tremors and numbness.  Hematological: Negative for adenopathy. Does not bruise/bleed easily.  Psychiatric/Behavioral: Negative for behavioral problems and sleep disturbance. The patient is not nervous/anxious.     Vital Signs: BP (!) 146/108 (BP Location: Left Arm)   Pulse (!) 55   Resp 16   Ht 5' (1.524 m)   Wt 171 lb (77.6 kg)   BMI 33.40 kg/m    Physical Exam Vitals signs  and nursing note reviewed.  Constitutional:      General: She is not in acute distress.    Appearance: She is well-developed. She is not diaphoretic.  HENT:     Head: Normocephalic and atraumatic.     Mouth/Throat:     Pharynx: No oropharyngeal exudate.  Eyes:     Pupils: Pupils are equal, round, and reactive to light.  Neck:     Musculoskeletal: Normal range of motion and neck supple.     Thyroid: No thyromegaly.     Vascular: No JVD.     Trachea: No tracheal deviation.  Cardiovascular:     Rate and Rhythm: Normal rate and regular rhythm.     Heart sounds: Normal heart sounds. No murmur. No friction rub. No gallop.   Pulmonary:     Effort: Pulmonary effort is normal. No respiratory distress.     Breath sounds: Normal breath sounds. No wheezing or rales.  Chest:     Chest wall: No tenderness.  Abdominal:     Palpations: Abdomen is soft.     Tenderness: There is no abdominal tenderness. There is no guarding.  Musculoskeletal: Normal range of motion.  Lymphadenopathy:     Cervical: No cervical adenopathy.  Skin:    General: Skin is warm and dry.  Neurological:     Mental Status: She is alert and oriented to person, place, and time.     Cranial Nerves: No cranial nerve deficit.  Psychiatric:        Behavior: Behavior normal.        Thought Content: Thought content normal.        Judgment: Judgment normal.    Assessment/Plan: 1. Essential  hypertension Restart bp meds as discussed.  - amLODipine (NORVASC) 10 MG tablet; Take 1 tablet (10 mg total) by mouth daily.  Dispense: 30 tablet; Refill: 5 - hydrALAZINE (APRESOLINE) 50 MG tablet; Take 1 tablet (50 mg total) by mouth 3 (three) times daily.  Dispense: 90 tablet; Refill: 3 - lisinopril (ZESTRIL) 20 MG tablet; Take 1 tablet (20 mg total) by mouth daily.  Dispense: 30 tablet; Refill: 3  2. Compliance poor Pt has compliance issues.   3. Uncontrolled type 2 diabetes mellitus with hyperglycemia (HCC) Take levemir and ozempic as directed.  - Semaglutide,0.25 or 0.5MG/DOS, (OZEMPIC, 0.25 OR 0.5 MG/DOSE,) 2 MG/1.5ML SOPN; Inject 0.5 mg into the skin once a week.  Dispense: 1 pen; Refill: 5 - insulin detemir (LEVEMIR) 100 UNIT/ML injection; Inject 0.35 mLs (35 Units total) into the skin daily.  Dispense: 10 mL; Refill: 5  General Counseling: Lalla verbalizes understanding of the findings of todays visit and agrees with plan of treatment. I have discussed any further diagnostic evaluation that may be needed or ordered today. We also reviewed her medications today. she has been encouraged to call the office with any questions or concerns that should arise related to todays visit.    No orders of the defined types were placed in this encounter.   Meds ordered this encounter  Medications  . Semaglutide,0.25 or 0.5MG/DOS, (OZEMPIC, 0.25 OR 0.5 MG/DOSE,) 2 MG/1.5ML SOPN    Sig: Inject 0.5 mg into the skin once a week.    Dispense:  1 pen    Refill:  5  . insulin detemir (LEVEMIR) 100 UNIT/ML injection    Sig: Inject 0.35 mLs (35 Units total) into the skin daily.    Dispense:  10 mL    Refill:  5  . amLODipine (NORVASC) 10 MG  tablet    Sig: Take 1 tablet (10 mg total) by mouth daily.    Dispense:  30 tablet    Refill:  5  . hydrALAZINE (APRESOLINE) 50 MG tablet    Sig: Take 1 tablet (50 mg total) by mouth 3 (three) times daily.    Dispense:  90 tablet    Refill:  3  .  lisinopril (ZESTRIL) 20 MG tablet    Sig: Take 1 tablet (20 mg total) by mouth daily.    Dispense:  30 tablet    Refill:  3    Time spent: 20 Minutes   This patient was seen by Orson Gear AGNP-C in Collaboration with Dr Lavera Guise as a part of collaborative care agreement     Kendell Bane AGNP-C Internal medicine

## 2019-01-26 ENCOUNTER — Ambulatory Visit: Payer: Medicare HMO | Admitting: Adult Health

## 2019-01-27 ENCOUNTER — Ambulatory Visit (INDEPENDENT_AMBULATORY_CARE_PROVIDER_SITE_OTHER): Payer: Medicare HMO | Admitting: Adult Health

## 2019-01-27 VITALS — BP 136/76 | HR 68 | Resp 16 | Ht 65.0 in | Wt 179.8 lb

## 2019-01-27 DIAGNOSIS — Z9119 Patient's noncompliance with other medical treatment and regimen: Secondary | ICD-10-CM | POA: Diagnosis not present

## 2019-01-27 DIAGNOSIS — I10 Essential (primary) hypertension: Secondary | ICD-10-CM | POA: Diagnosis not present

## 2019-01-27 DIAGNOSIS — Z1159 Encounter for screening for other viral diseases: Secondary | ICD-10-CM | POA: Diagnosis not present

## 2019-01-27 DIAGNOSIS — E782 Mixed hyperlipidemia: Secondary | ICD-10-CM

## 2019-01-27 DIAGNOSIS — Z91199 Patient's noncompliance with other medical treatment and regimen due to unspecified reason: Secondary | ICD-10-CM

## 2019-01-27 NOTE — Progress Notes (Signed)
[facility] [place], [place] [address]  Internal MEDICINE  Office Visit Note  Patient Name: Dawn Alvarez  [id number]  [id number]  Date of Service: 01/27/2019  Chief Complaint  Patient presents with  . Medical Management of Chronic Issues    3 week follow up   . Hypertension  . Quality Metric Gaps    eye exam and lipid panel,hep c screeniing     HPI  Pt is here for follow up on HTN.  She has obtained her bp medication and appears to be taking it.  Her bp is much improved today at 136/76. Pt has not been taking levemir, because she did not know how to take it. Will do some re-teaching today.  Will get lipid panel and hep c screen to close quality metric gaps.     Current Medication: Outpatient Encounter Medications as of 01/27/2019  Medication Sig  . amLODipine (NORVASC) 10 MG tablet Take 1 tablet (10 mg total) by mouth daily.  . bisoprolol-hydrochlorothiazide (ZIAC) 10-6.25 MG tablet Take 1 tablet by mouth daily.  Marland Kitchen glucose blood (ACCU-CHEK SMARTVIEW) test strip 1 each by Other route as needed for other. Use as directed twice a day diag E11.65  . hydrALAZINE (APRESOLINE) 50 MG tablet Take 1 tablet (50 mg total) by mouth 3 (three) times daily.  . Insulin Pen Needle (PEN NEEDLES) 31G X 8 MM MISC Use as directed with insulin E11.65  . Lancets Misc. (ACCU-CHEK MULTICLIX LANCET DEV) KIT by Does not apply route. Use as directed twice a day diag E11.65  . lisinopril (ZESTRIL) 20 MG tablet Take 1 tablet (20 mg total) by mouth daily.  . Semaglutide,0.25 or 0.5MG/DOS, (OZEMPIC, 0.25 OR 0.5 MG/DOSE,) 2 MG/1.5ML SOPN Inject 0.5 mg into the skin once a week.  . insulin detemir (LEVEMIR) 100 UNIT/ML injection Inject 0.35 mLs (35 Units total) into the skin daily.  . [DISCONTINUED] Cholecalciferol (VITAMIN D3) 2000 units TABS Take 1 tablet by mouth daily.  . [DISCONTINUED] gemfibrozil (LOPID) 600 MG tablet Take 1 tablet (600 mg total) by mouth 2 (two) times daily  before a meal. (Patient not taking: Reported on 01/27/2019)  . [DISCONTINUED] glyBURIDE (DIABETA) 5 MG tablet Take 5 mg by mouth 2 (two) times daily with a meal.  . [DISCONTINUED] isosorbide mononitrate (IMDUR) 30 MG 24 hr tablet Take 1 tablet (30 mg total) by mouth daily. (Patient not taking: Reported on 01/27/2019)  . [DISCONTINUED] omega-3 acid ethyl esters (LOVAZA) 1 g capsule Take 1 capsule (1 g total) by mouth 2 (two) times daily. (Patient not taking: Reported on 01/27/2019)  . [DISCONTINUED] omeprazole (PRILOSEC) 40 MG capsule Take 40 mg by mouth daily.  . [DISCONTINUED] pravastatin (PRAVACHOL) 40 MG tablet Take 40 mg by mouth at bedtime.  . [DISCONTINUED] zolpidem (AMBIEN) 5 MG tablet Take 5 mg by mouth at bedtime as needed for sleep.   No facility-administered encounter medications on file as of 01/27/2019.     Surgical History: Past Surgical History:  Procedure Laterality Date  . CESAREAN SECTION    . COLONOSCOPY WITH PROPOFOL N/A 09/11/2015   Procedure: COLONOSCOPY WITH PROPOFOL;  Surgeon: Lucilla Lame, MD;  Location: [facility] ENDOSCOPY;  Service: Endoscopy;  Laterality: N/A;    Medical History: Past Medical History:  Diagnosis Date  . Asthma   . COPD exacerbation ([place]) 04/12/2016  . Diabetes mellitus without complication ([place])   . Hyperlipemia   . Hypertension     Family History: Family History  Problem Relation Age  of Onset  . Diabetes Mother   . Hypertension Mother   . Hypertension Father   . Diabetes Father     Social History   Socioeconomic History  . Marital status: Married    Spouse name: Not on file  . Number of children: Not on file  . Years of education: Not on file  . Highest education level: Not on file  Occupational History  . Not on file  Social Needs  . Financial resource strain: Not on file  . Food insecurity    Worry: Not on file    Inability: Not on file  . Transportation needs    Medical: Not on file    Non-medical: Not on file  Tobacco Use   . Smoking status: Former Smoker    Packs/day: 1.00    Years: 2.00    Pack years: 2.00    Types: Cigarettes  . Smokeless tobacco: Never Used  Substance and Sexual Activity  . Alcohol use: Yes    Alcohol/week: 2.0 standard drinks    Types: 2 Cans of beer per week    Comment: occasionally  . Drug use: No  . Sexual activity: Never    Birth control/protection: Abstinence  Lifestyle  . Physical activity    Days per week: Not on file    Minutes per session: Not on file  . Stress: Not on file  Relationships  . Social Herbalist on phone: Not on file    Gets together: Not on file    Attends religious service: Not on file    Active member of club or organization: Not on file    Attends meetings of clubs or organizations: Not on file    Relationship status: Not on file  . Intimate partner violence    Fear of current or ex partner: Not on file    Emotionally abused: Not on file    Physically abused: Not on file    Forced sexual activity: Not on file  Other Topics Concern  . Not on file  Social History Narrative  . Not on file      Review of Systems  Constitutional: Negative for chills, fatigue and unexpected weight change.  HENT: Negative for congestion, rhinorrhea, sneezing and sore throat.   Eyes: Negative for photophobia, pain and redness.  Respiratory: Negative for cough, chest tightness and shortness of breath.   Cardiovascular: Negative for chest pain and palpitations.  Gastrointestinal: Negative for abdominal pain, constipation, diarrhea, nausea and vomiting.  Endocrine: Negative.   Genitourinary: Negative for dysuria and frequency.  Musculoskeletal: Negative for arthralgias, back pain, joint swelling and neck pain.  Skin: Negative for rash.  Allergic/Immunologic: Negative.   Neurological: Negative for tremors and numbness.  Hematological: Negative for adenopathy. Does not bruise/bleed easily.  Psychiatric/Behavioral: Negative for behavioral problems and  sleep disturbance. The patient is not nervous/anxious.     Vital Signs: BP 136/76   Pulse 68   Resp 16   Ht '5\' 5"'  (1.651 m)   Wt 179 lb 12.8 oz (81.6 kg)   SpO2 97%   BMI 29.92 kg/m    Physical Exam Vitals signs and nursing note reviewed.  Constitutional:      General: She is not in acute distress.    Appearance: She is well-developed. She is not diaphoretic.  HENT:     Head: Normocephalic and atraumatic.     Mouth/Throat:     Pharynx: No oropharyngeal exudate.  Eyes:     Pupils:  Pupils are equal, round, and reactive to light.  Neck:     Musculoskeletal: Normal range of motion and neck supple.     Thyroid: No thyromegaly.     Vascular: No JVD.     Trachea: No tracheal deviation.  Cardiovascular:     Rate and Rhythm: Normal rate and regular rhythm.     Heart sounds: Normal heart sounds. No murmur. No friction rub. No gallop.   Pulmonary:     Effort: Pulmonary effort is normal. No respiratory distress.     Breath sounds: Normal breath sounds. No wheezing or rales.  Chest:     Chest wall: No tenderness.  Abdominal:     Palpations: Abdomen is soft.     Tenderness: There is no abdominal tenderness. There is no guarding.  Musculoskeletal: Normal range of motion.  Lymphadenopathy:     Cervical: No cervical adenopathy.  Skin:    General: Skin is warm and dry.  Neurological:     Mental Status: She is alert and oriented to person, place, and time.     Cranial Nerves: No cranial nerve deficit.  Psychiatric:        Behavior: Behavior normal.        Thought Content: Thought content normal.        Judgment: Judgment normal.    Assessment/Plan: 1. Mixed hyperlipidemia Lipid panel ordered for patient. - Lipid Panel With LDL/HDL Ratio  2. Essential hypertension BP is improved now that patient is taking her medications.   3. Compliance poor PT has history of poor complaince.   4. Encounter for hepatitis C screening test for low risk patient - Hepatitis c antibody  (reflex)  General Counseling: Nitza verbalizes understanding of the findings of todays visit and agrees with plan of treatment. I have discussed any further diagnostic evaluation that may be needed or ordered today. We also reviewed her medications today. she has been encouraged to call the office with any questions or concerns that should arise related to todays visit.    No orders of the defined types were placed in this encounter.   No orders of the defined types were placed in this encounter.   Time spent: 25 Minutes   This patient was seen by Orson Gear AGNP-C in Collaboration with Dr Lavera Guise as a part of collaborative care agreement     Kendell Bane AGNP-C Internal medicine

## 2019-03-09 ENCOUNTER — Other Ambulatory Visit: Payer: Self-pay

## 2019-03-09 ENCOUNTER — Ambulatory Visit (INDEPENDENT_AMBULATORY_CARE_PROVIDER_SITE_OTHER): Payer: Medicare HMO | Admitting: Adult Health

## 2019-03-09 ENCOUNTER — Encounter: Payer: Self-pay | Admitting: Adult Health

## 2019-03-09 VITALS — BP 147/83 | HR 87 | Temp 98.1°F | Resp 16 | Ht 65.0 in | Wt 171.0 lb

## 2019-03-09 DIAGNOSIS — I10 Essential (primary) hypertension: Secondary | ICD-10-CM | POA: Diagnosis not present

## 2019-03-09 DIAGNOSIS — E782 Mixed hyperlipidemia: Secondary | ICD-10-CM | POA: Diagnosis not present

## 2019-03-09 DIAGNOSIS — E1165 Type 2 diabetes mellitus with hyperglycemia: Secondary | ICD-10-CM | POA: Diagnosis not present

## 2019-03-09 LAB — POCT GLYCOSYLATED HEMOGLOBIN (HGB A1C): Hemoglobin A1C: 8.4 % — AB (ref 4.0–5.6)

## 2019-03-09 MED ORDER — OZEMPIC (1 MG/DOSE) 2 MG/1.5ML ~~LOC~~ SOPN: 1.0000 mg | PEN_INJECTOR | SUBCUTANEOUS | 3 refills | Status: DC

## 2019-03-09 NOTE — Progress Notes (Signed)
[facility] [place], [place] [address]  Internal MEDICINE  Office Visit Note  Patient Name: Dawn Alvarez  [id number]  [id number]  Date of Service: 03/09/2019  Chief Complaint  Patient presents with  . Diabetes  . Hypertension  . Quality Metric Gaps    diabetic eye exam and lipid panel    HPI  Pt is here for follow up on DM, and HTN.  Her bp remains slightly elevated, but overall stable, 147/83.  She denies any chest pain, sob, headaches or blurred. Her A1C has improved today to 8.4 from 9.5 earlier this year.  She is currently taking 0.98m of ozempic weekly, and 35 units of levimir daily.     Current Medication: Outpatient Encounter Medications as of 03/09/2019  Medication Sig  . amLODipine (NORVASC) 10 MG tablet Take 1 tablet (10 mg total) by mouth daily.  . bisoprolol-hydrochlorothiazide (ZIAC) 10-6.25 MG tablet Take 1 tablet by mouth daily.  .Marland Kitchenglucose blood (ACCU-CHEK SMARTVIEW) test strip 1 each by Other route as needed for other. Use as directed twice a day diag E11.65  . hydrALAZINE (APRESOLINE) 50 MG tablet Take 1 tablet (50 mg total) by mouth 3 (three) times daily.  . insulin detemir (LEVEMIR) 100 UNIT/ML injection Inject 0.35 mLs (35 Units total) into the skin daily.  . Insulin Pen Needle (PEN NEEDLES) 31G X 8 MM MISC Use as directed with insulin E11.65  . Lancets Misc. (ACCU-CHEK MULTICLIX LANCET DEV) KIT by Does not apply route. Use as directed twice a day diag E11.65  . lisinopril (ZESTRIL) 20 MG tablet Take 1 tablet (20 mg total) by mouth daily.  . [DISCONTINUED] Semaglutide,0.25 or 0.5MG/DOS, (OZEMPIC, 0.25 OR 0.5 MG/DOSE,) 2 MG/1.5ML SOPN Inject 0.5 mg into the skin once a week.  . Semaglutide, 1 MG/DOSE, (OZEMPIC, 1 MG/DOSE,) 2 MG/1.5ML SOPN Inject 1 mg into the skin once a week.   No facility-administered encounter medications on file as of 03/09/2019.     Surgical History: Past Surgical History:  Procedure Laterality Date  .  CESAREAN SECTION    . COLONOSCOPY WITH PROPOFOL N/A 09/11/2015   Procedure: COLONOSCOPY WITH PROPOFOL;  Surgeon: DLucilla Lame MD;  Location: [facility] ENDOSCOPY;  Service: Endoscopy;  Laterality: N/A;    Medical History: Past Medical History:  Diagnosis Date  . Asthma   . [place] [employer] 04[place]12[place]  . [place]   . Hyperlipemia   . Hypertension     Family History: Family History  Problem Relation Age of Onset  . Diabetes Mother   . Hypertension Mother   . Hypertension Father   . Diabetes Father     Social History   Socioeconomic History  . Marital status: Married    Spouse name: Not on file  . Number of children: Not on file  . Years of education: Not on file  . Highest education level: Not on file  Occupational History  . Not on file  Social Needs  . Financial resource strain: Not on file  . Food insecurity    Worry: Not on file    Inability: Not on file  . Transportation needs    Medical: Not on file    Non-medical: Not on file  Tobacco Use  . Smoking status: Former Smoker    Packs/day: 1.00    Years: 2.00    Pack years: 2.00    Types: Cigarettes  . Smokeless tobacco: Never Used  Substance and Sexual Activity  . Alcohol  use: Yes    Alcohol/week: 2.0 standard drinks    Types: 2 Cans of beer per week    Comment: occasionally  . Drug use: No  . Sexual activity: Never    Birth control/protection: Abstinence  Lifestyle  . Physical activity    Days per week: Not on file    Minutes per session: Not on file  . Stress: Not on file  Relationships  . Social Herbalist on phone: Not on file    Gets together: Not on file    Attends religious service: Not on file    Active member of club or organization: Not on file    Attends meetings of clubs or organizations: Not on file    Relationship status: Not on file  . Intimate partner violence    Fear of current or ex partner: Not on file    Emotionally abused: Not  on file    Physically abused: Not on file    Forced sexual activity: Not on file  Other Topics Concern  . Not on file  Social History Narrative  . Not on file      Review of Systems  Constitutional: Negative for chills, fatigue and unexpected weight change.  HENT: Negative for congestion, rhinorrhea, sneezing and sore throat.   Eyes: Negative for photophobia, pain and redness.  Respiratory: Negative for cough, chest tightness and shortness of breath.   Cardiovascular: Negative for chest pain and palpitations.  Gastrointestinal: Negative for abdominal pain, constipation, diarrhea, nausea and vomiting.  Endocrine: Negative.   Genitourinary: Negative for dysuria and frequency.  Musculoskeletal: Negative for arthralgias, back pain, joint swelling and neck pain.  Skin: Negative for rash.  Allergic/Immunologic: Negative.   Neurological: Negative for tremors and numbness.  Hematological: Negative for adenopathy. Does not bruise/bleed easily.  Psychiatric/Behavioral: Negative for behavioral problems and sleep disturbance. The patient is not nervous/anxious.     Vital Signs: BP (!) 147/83   Pulse 87   Temp 98.1 F (36.7 C)   Resp 16   Ht '5\' 5"'  (1.651 m)   Wt 171 lb (77.6 kg)   SpO2 99%   BMI 28.46 kg/m    Physical Exam Vitals signs and nursing note reviewed.  Constitutional:      General: She is not in acute distress.    Appearance: She is well-developed. She is not diaphoretic.  HENT:     Head: Normocephalic and atraumatic.     Mouth/Throat:     Pharynx: No oropharyngeal exudate.  Eyes:     Pupils: Pupils are equal, round, and reactive to light.  Neck:     Musculoskeletal: Normal range of motion and neck supple.     Thyroid: No thyromegaly.     Vascular: No JVD.     Trachea: No tracheal deviation.  Cardiovascular:     Rate and Rhythm: Normal rate and regular rhythm.     Heart sounds: Normal heart sounds. No murmur. No friction rub. No gallop.   Pulmonary:      Effort: Pulmonary effort is normal. No respiratory distress.     Breath sounds: Normal breath sounds. No wheezing or rales.  Chest:     Chest wall: No tenderness.  Abdominal:     Palpations: Abdomen is soft.     Tenderness: There is no abdominal tenderness. There is no guarding.  Musculoskeletal: Normal range of motion.  Lymphadenopathy:     Cervical: No cervical adenopathy.  Skin:    General: Skin is warm  and dry.  Neurological:     Mental Status: She is alert and oriented to person, place, and time.     Cranial Nerves: No cranial nerve deficit.  Psychiatric:        Behavior: Behavior normal.        Thought Content: Thought content normal.        Judgment: Judgment normal.     Assessment/Plan: 1. Uncontrolled type 2 diabetes mellitus with hyperglycemia (HCC) Increase dose of ozempic to 44m weekly.  Follow up in 3 months or sooner if unable to tolerate medication increase. Referral to optometry for diabetic eye exam.  - POCT HgB A1C - Semaglutide, 1 MG/DOSE, (OZEMPIC, 1 MG/DOSE,) 2 MG/1.5ML SOPN; Inject 1 mg into the skin once a week.  Dispense: 5 pen; Refill: 3 - Ambulatory referral to Optometry  2. Mixed hyperlipidemia Lipid panel ordered on paper for patient.   3. Essential hypertension Controlled, continue to follow.   General Counseling: Socorro verbalizes understanding of the findings of todays visit and agrees with plan of treatment. I have discussed any further diagnostic evaluation that may be needed or ordered today. We also reviewed her medications today. she has been encouraged to call the office with any questions or concerns that should arise related to todays visit.    Orders Placed This Encounter  Procedures  . Ambulatory referral to Optometry  . POCT HgB A1C    Meds ordered this encounter  Medications  . Semaglutide, 1 MG/DOSE, (OZEMPIC, 1 MG/DOSE,) 2 MG/1.5ML SOPN    Sig: Inject 1 mg into the skin once a week.    Dispense:  5 pen    Refill:  3     Increased dose from 0.5534mto 34m57meekly.    Time spent: 15 Minutes   This patient was seen by AdaOrson GearNP-C in Collaboration with Dr FozLavera Guise a part of collaborative care agreement     AdaKendell BaneNP-C Internal medicine

## 2019-03-16 ENCOUNTER — Other Ambulatory Visit: Payer: Self-pay | Admitting: Adult Health

## 2019-03-16 DIAGNOSIS — E1165 Type 2 diabetes mellitus with hyperglycemia: Secondary | ICD-10-CM | POA: Diagnosis not present

## 2019-03-17 LAB — LIPID PANEL WITH LDL/HDL RATIO
Cholesterol, Total: 227 mg/dL — ABNORMAL HIGH (ref 100–199)
HDL: 41 mg/dL (ref >39)
LDL Chol Calc (NIH): 147 mg/dL — ABNORMAL HIGH (ref 0–99)
LDL/HDL Ratio: 3.6 ratio — ABNORMAL HIGH (ref 0.0–3.2)
Triglycerides: 216 mg/dL — ABNORMAL HIGH (ref 0–149)
VLDL Cholesterol Cal: 39 mg/dL (ref 5–40)

## 2019-03-21 ENCOUNTER — Telehealth: Payer: Self-pay | Admitting: Adult Health

## 2019-03-21 NOTE — Telephone Encounter (Signed)
Patient informed of improved lipid panel.

## 2019-03-21 NOTE — Telephone Encounter (Signed)
-----   Message from Lavera Guise, MD sent at 03/19/2019  1:49 PM EDT ----- Improved Lipid profile, keep up the good work

## 2019-04-25 ENCOUNTER — Other Ambulatory Visit: Payer: Self-pay

## 2019-04-25 DIAGNOSIS — I10 Essential (primary) hypertension: Secondary | ICD-10-CM

## 2019-04-25 MED ORDER — LISINOPRIL 20 MG PO TABS: 20.0000 mg | ORAL_TABLET | Freq: Every day | ORAL | 3 refills | Status: DC

## 2019-04-27 ENCOUNTER — Other Ambulatory Visit: Payer: Self-pay

## 2019-04-27 MED ORDER — SULFAMETHOXAZOLE-TRIMETHOPRIM 800-160 MG PO TABS: 1.0000 | ORAL_TABLET | Freq: Two times a day (BID) | ORAL | 0 refills | Status: DC

## 2019-04-27 NOTE — Telephone Encounter (Signed)
Pt husband called that pt having UTI symptoms burning when urinated as per adam send bactrim for 5 days and also advised pt need to been seen 2 weeks follow up UTI and gave call tat to make appt

## 2019-05-09 ENCOUNTER — Telehealth: Payer: Self-pay

## 2019-05-09 NOTE — Telephone Encounter (Signed)
Called lmom informing patient of appointment. klh 

## 2019-05-11 ENCOUNTER — Ambulatory Visit: Payer: Medicare HMO | Admitting: Adult Health

## 2019-06-03 DIAGNOSIS — Z9221 Personal history of antineoplastic chemotherapy: Secondary | ICD-10-CM

## 2019-06-03 HISTORY — DX: Personal history of antineoplastic chemotherapy: Z92.21

## 2019-06-08 ENCOUNTER — Telehealth: Payer: Self-pay

## 2019-06-08 NOTE — Telephone Encounter (Signed)
LMOM FOR PATIENT TO SCREEN AND CONFIRM 06-10-19 OV.

## 2019-06-10 ENCOUNTER — Ambulatory Visit: Payer: Medicare HMO | Admitting: Nurse Practitioner

## 2019-06-14 ENCOUNTER — Encounter: Payer: Self-pay | Admitting: Nurse Practitioner

## 2019-06-14 ENCOUNTER — Ambulatory Visit (INDEPENDENT_AMBULATORY_CARE_PROVIDER_SITE_OTHER): Payer: Medicare HMO | Admitting: Nurse Practitioner

## 2019-06-14 ENCOUNTER — Other Ambulatory Visit: Payer: Self-pay

## 2019-06-14 ENCOUNTER — Other Ambulatory Visit: Payer: Self-pay | Admitting: Nurse Practitioner

## 2019-06-14 VITALS — BP 151/89 | HR 83 | Resp 16 | Ht 65.0 in | Wt 175.4 lb

## 2019-06-14 DIAGNOSIS — H539 Unspecified visual disturbance: Secondary | ICD-10-CM | POA: Diagnosis not present

## 2019-06-14 DIAGNOSIS — E1165 Type 2 diabetes mellitus with hyperglycemia: Secondary | ICD-10-CM

## 2019-06-14 DIAGNOSIS — Z1231 Encounter for screening mammogram for malignant neoplasm of breast: Secondary | ICD-10-CM

## 2019-06-14 DIAGNOSIS — M7989 Other specified soft tissue disorders: Secondary | ICD-10-CM | POA: Diagnosis not present

## 2019-06-14 DIAGNOSIS — N6310 Unspecified lump in the right breast, unspecified quadrant: Secondary | ICD-10-CM

## 2019-06-14 DIAGNOSIS — R2232 Localized swelling, mass and lump, left upper limb: Secondary | ICD-10-CM

## 2019-06-14 MED ORDER — INSULIN DETEMIR 100 UNIT/ML ~~LOC~~ SOLN: 40.0000 [IU] | Freq: Every day | SUBCUTANEOUS | 5 refills | Status: DC

## 2019-06-14 NOTE — Progress Notes (Signed)
[facility] [place], [place] [address]  Internal MEDICINE  Office Visit Note  Patient Name: Dawn Alvarez  [id number]  [id number]  Date of Service: 06/18/2019  Chief Complaint  Patient presents with  . Cyst    on right side of breast     Dawn Alvarez presents to clinic today with complaints of nodule in Left breast/axilla region. She denies any pain or swelling. She also reports some blurry vision which she noticed started about one week ago. She reports that she has had a previous eye exam with no abnormalities. She does not wear corrective lenses.      Current Medication: Outpatient Encounter Medications as of 06/14/2019  Medication Sig  . amLODipine (NORVASC) 10 MG tablet Take 1 tablet (10 mg total) by mouth daily.  . bisoprolol-hydrochlorothiazide (ZIAC) 10-6.25 MG tablet Take 1 tablet by mouth daily.  Marland Kitchen glucose blood (ACCU-CHEK SMARTVIEW) test strip 1 each by Other route as needed for other. Use as directed twice a day diag E11.65  . hydrALAZINE (APRESOLINE) 50 MG tablet Take 1 tablet (50 mg total) by mouth 3 (three) times daily.  . insulin detemir (LEVEMIR) 100 UNIT/ML injection Inject 0.4 mLs (40 Units total) into the skin daily.  . Insulin Pen Needle (PEN NEEDLES) 31G X 8 MM MISC Use as directed with insulin E11.65  . Lancets Misc. (ACCU-CHEK MULTICLIX LANCET DEV) KIT by Does not apply route. Use as directed twice a day diag E11.65  . lisinopril (ZESTRIL) 20 MG tablet Take 1 tablet (20 mg total) by mouth daily.  . Semaglutide, 1 MG/DOSE, (OZEMPIC, 1 MG/DOSE,) 2 MG/1.5ML SOPN Inject 1 mg into the skin once a week.  . [DISCONTINUED] insulin detemir (LEVEMIR) 100 UNIT/ML injection Inject 0.35 mLs (35 Units total) into the skin daily.  . [DISCONTINUED] sulfamethoxazole-trimethoprim (BACTRIM DS) 800-160 MG tablet Take 1 tablet by mouth 2 (two) times daily. (Patient not taking: Reported on 06/14/2019)   No facility-administered encounter medications on  file as of 06/14/2019.    Surgical History: Past Surgical History:  Procedure Laterality Date  . CESAREAN SECTION    . COLONOSCOPY WITH PROPOFOL N/A 09/11/2015   Procedure: COLONOSCOPY WITH PROPOFOL;  Surgeon: Lucilla Lame, MD;  Location: [facility] ENDOSCOPY;  Service: Endoscopy;  Laterality: N/A;    Medical History: Past Medical History:  Diagnosis Date  . Asthma   . COPD exacerbation ([place]) 04/12/2016  . Diabetes mellitus without complication ([place])   . Hyperlipemia   . Hypertension     Family History: Family History  Problem Relation Age of Onset  . Diabetes Mother   . Hypertension Mother   . Hypertension Father   . Diabetes Father     Social History   Socioeconomic History  . Marital status: Married    Spouse name: Not on file  . Number of children: Not on file  . Years of education: Not on file  . Highest education level: Not on file  Occupational History  . Not on file  Tobacco Use  . Smoking status: Former Smoker    Packs/day: 1.00    Years: 2.00    Pack years: 2.00    Types: Cigarettes  . Smokeless tobacco: Never Used  Substance and Sexual Activity  . Alcohol use: Yes    Alcohol/week: 2.0 standard drinks    Types: 2 Cans of beer per week    Comment: weekends   . Drug use: No  . Sexual activity: Never    Birth control/protection: Abstinence  Other Topics Concern  . Not on file  Social History Narrative  . Not on file   Social Determinants of Health   Financial Resource Strain:   . Difficulty of Paying Living Expenses: Not on file  Food Insecurity:   . Worried About Charity fundraiser in the Last Year: Not on file  . Ran Out of Food in the Last Year: Not on file  Transportation Needs:   . Lack of Transportation (Medical): Not on file  . Lack of Transportation (Non-Medical): Not on file  Physical Activity:   . Days of Exercise per Week: Not on file  . Minutes of Exercise per Session: Not on file  Stress:   . Feeling of Stress : Not on file    Social Connections:   . Frequency of Communication with Friends and Family: Not on file  . Frequency of Social Gatherings with Friends and Family: Not on file  . Attends Religious Services: Not on file  . Active Member of Clubs or Organizations: Not on file  . Attends Archivist Meetings: Not on file  . Marital Status: Not on file  Intimate Partner Violence:   . Fear of Current or Ex-Partner: Not on file  . Emotionally Abused: Not on file  . Physically Abused: Not on file  . Sexually Abused: Not on file      Review of Systems  Constitutional: Negative for chills, fatigue and fever.  HENT: Negative for congestion, rhinorrhea, sinus pain and sore throat.   Eyes: Positive for visual disturbance. Negative for photophobia and pain.       Blurry vision, noticed last week  Respiratory: Negative for cough, shortness of breath and wheezing.   Cardiovascular: Negative for chest pain and palpitations.  Gastrointestinal: Negative for abdominal pain, nausea and vomiting.  Endocrine: Negative for cold intolerance, heat intolerance, polydipsia and polyuria.       Blood sugars running elevated recently  Genitourinary: Negative for difficulty urinating.  Musculoskeletal: Negative for arthralgias and back pain.  Skin: Negative for rash and wound.  Allergic/Immunologic: Negative for environmental allergies and food allergies.  Neurological: Positive for headaches. Negative for dizziness, light-headedness and numbness.       Intermittent headaches  Psychiatric/Behavioral: Negative for sleep disturbance. The patient is not nervous/anxious.    Today's Vitals   06/14/19 1404  BP: (!) 151/89  Pulse: 83  Resp: 16  SpO2: 98%  Weight: 175 lb 6.4 oz (79.6 kg)  Height: '5\' 5"'  (1.651 m)   Body mass index is 29.19 kg/m. Physical Exam Vitals and nursing note reviewed.  Constitutional:      General: She is not in acute distress.    Appearance: Normal appearance.  HENT:     Head:  Normocephalic and atraumatic.     Nose: Nose normal.  Eyes:     General:        Right eye: No discharge.        Left eye: No discharge.     Extraocular Movements: Extraocular movements intact.     Conjunctiva/sclera: Conjunctivae normal.     Pupils: Pupils are equal, round, and reactive to light.     Funduscopic exam:    Right eye: Red reflex present.        Left eye: Red reflex present.    Slit lamp exam:    Right eye: No photophobia.     Left eye: No photophobia.  Cardiovascular:     Rate and Rhythm: Normal rate and regular rhythm.  Heart sounds: Normal heart sounds.  Pulmonary:     Effort: Pulmonary effort is normal.     Breath sounds: Normal breath sounds.  Chest:     Chest wall: No lacerations, deformity or swelling.     Breasts: Tanner Score is 5.    Musculoskeletal:     Cervical back: Normal range of motion and neck supple.  Skin:    General: Skin is warm and dry.  Neurological:     Mental Status: She is alert.  Psychiatric:        Mood and Affect: Mood normal.        Behavior: Behavior normal.    Assessment/Plan: 1. Mass of soft tissue of chest Will get ultrasound of left axillary region for further evaluation.   2. Mass of axilla, left Will get ultrasound of left axillary region for further evaluation.  - US BREAST LTD UNI LEFT INC AXILLA; Future  3. Encounter for screening mammogram for malignant neoplasm of breast Screening mammogram ordered.  - MM DIAG BREAST TOMO BILATERAL; Future  4. Visual changes Refer to ophthalmology for further evaluation - Ambulatory referral to Ophthalmology  5. Uncontrolled type 2 diabetes mellitus with hyperglycemia (HCC) HgbA1c 8.2 tdday. Increase levemir to 40 units every day. Continue other diabetic medications as prescribed. Refer for diabetic eye exam.  - POCT HgB A1C - Ambulatory referral to Ophthalmology - insulin detemir (LEVEMIR) 100 UNIT/ML injection; Inject 0.4 mLs (40 Units total) into the skin daily.   Dispense: 10 mL; Refill: 5  General Counseling: Shiara verbalizes understanding of the findings of todays visit and agrees with plan of treatment. I have discussed any further diagnostic evaluation that may be needed or ordered today. We also reviewed her medications today. she has been encouraged to call the office with any questions or concerns that should arise related to todays visit.  Diabetes Counseling:  1. Addition of ACE inh/ ARB'S for nephroprotection. Microalbumin is updated  2. Diabetic foot care, prevention of complications. Podiatry consult 3. Exercise and lose weight.  4. Diabetic eye examination, Diabetic eye exam is updated  5. Monitor blood sugar closlely. nutrition counseling.  6. Sign and symptoms of hypoglycemia including shaking sweating,confusion and headaches.  This patient was seen by Leretha Pol FNP Collaboration with Dr Lavera Guise as a part of collaborative care agreement  Orders Placed This Encounter  Procedures  . MM DIAG BREAST TOMO BILATERAL  . US BREAST LTD UNI LEFT INC AXILLA  . Ambulatory referral to Ophthalmology  . POCT HgB A1C    Meds ordered this encounter  Medications  . insulin detemir (LEVEMIR) 100 UNIT/ML injection    Sig: Inject 0.4 mLs (40 Units total) into the skin daily.    Dispense:  10 mL    Refill:  5    Please note change in dosing    Order Specific Question:   Supervising Provider    Answer:   Lavera Guise [[id number]]    Total time spent: 30 Minutes  Time spent includes review of chart, medications, test results, and follow up plan with the patient.      Dr Lavera Guise Internal medicine

## 2019-06-18 DIAGNOSIS — M7989 Other specified soft tissue disorders: Secondary | ICD-10-CM | POA: Insufficient documentation

## 2019-06-18 DIAGNOSIS — Z1231 Encounter for screening mammogram for malignant neoplasm of breast: Secondary | ICD-10-CM | POA: Insufficient documentation

## 2019-06-18 DIAGNOSIS — R2232 Localized swelling, mass and lump, left upper limb: Secondary | ICD-10-CM | POA: Insufficient documentation

## 2019-06-18 DIAGNOSIS — H539 Unspecified visual disturbance: Secondary | ICD-10-CM | POA: Insufficient documentation

## 2019-06-20 LAB — POCT GLYCOSYLATED HEMOGLOBIN (HGB A1C): Hemoglobin A1C: 8.2 % — AB (ref 4.0–5.6)

## 2019-06-23 ENCOUNTER — Inpatient Hospital Stay: Admission: RE | Admit: 2019-06-23 | Payer: Medicare HMO | Source: Ambulatory Visit

## 2019-06-23 ENCOUNTER — Other Ambulatory Visit: Payer: Medicare HMO

## 2019-06-30 ENCOUNTER — Ambulatory Visit
Admission: RE | Admit: 2019-06-30 | Discharge: 2019-06-30 | Disposition: A | Discharge status: Home / Self Care | Payer: Medicare HMO | Source: Ambulatory Visit | Attending: Nurse Practitioner | Admitting: Nurse Practitioner

## 2019-06-30 ENCOUNTER — Other Ambulatory Visit: Payer: Self-pay | Admitting: Nurse Practitioner

## 2019-06-30 DIAGNOSIS — N6310 Unspecified lump in the right breast, unspecified quadrant: Secondary | ICD-10-CM

## 2019-06-30 DIAGNOSIS — R928 Other abnormal and inconclusive findings on diagnostic imaging of breast: Secondary | ICD-10-CM | POA: Diagnosis not present

## 2019-06-30 DIAGNOSIS — N6311 Unspecified lump in the right breast, upper outer quadrant: Secondary | ICD-10-CM | POA: Diagnosis not present

## 2019-06-30 DIAGNOSIS — R2231 Localized swelling, mass and lump, right upper limb: Secondary | ICD-10-CM | POA: Diagnosis not present

## 2019-06-30 DIAGNOSIS — R2232 Localized swelling, mass and lump, left upper limb: Secondary | ICD-10-CM | POA: Diagnosis present

## 2019-06-30 DIAGNOSIS — Z1231 Encounter for screening mammogram for malignant neoplasm of breast: Secondary | ICD-10-CM

## 2019-06-30 DIAGNOSIS — N6312 Unspecified lump in the right breast, upper inner quadrant: Secondary | ICD-10-CM | POA: Diagnosis not present

## 2019-07-05 ENCOUNTER — Ambulatory Visit
Admission: RE | Admit: 2019-07-05 | Discharge: 2019-07-05 | Disposition: A | Discharge status: Home / Self Care | Payer: Medicare HMO | Source: Ambulatory Visit | Attending: Nurse Practitioner | Admitting: Nurse Practitioner

## 2019-07-05 DIAGNOSIS — C50411 Malignant neoplasm of upper-outer quadrant of right female breast: Secondary | ICD-10-CM | POA: Diagnosis not present

## 2019-07-05 DIAGNOSIS — N6310 Unspecified lump in the right breast, unspecified quadrant: Secondary | ICD-10-CM

## 2019-07-05 DIAGNOSIS — R928 Other abnormal and inconclusive findings on diagnostic imaging of breast: Secondary | ICD-10-CM | POA: Insufficient documentation

## 2019-07-05 DIAGNOSIS — N6311 Unspecified lump in the right breast, upper outer quadrant: Secondary | ICD-10-CM | POA: Diagnosis not present

## 2019-07-05 DIAGNOSIS — R590 Localized enlarged lymph nodes: Secondary | ICD-10-CM | POA: Diagnosis not present

## 2019-07-05 HISTORY — PX: BREAST BIOPSY: SHX20

## 2019-07-06 ENCOUNTER — Encounter: Payer: Self-pay | Admitting: *Deleted

## 2019-07-06 ENCOUNTER — Other Ambulatory Visit: Payer: Self-pay | Admitting: Anatomic Pathology & Clinical Pathology

## 2019-07-06 DIAGNOSIS — C50911 Malignant neoplasm of unspecified site of right female breast: Secondary | ICD-10-CM

## 2019-07-06 NOTE — Progress Notes (Signed)
Called patient today to establish navigation services.  Patient is newly diagnosed with invasive breast cancer.  Patient palpated a mass about 2 weeks ago.  Family history of an unknown cancer in a maternal first cousin.  Patient is scheduled to see Dr. Tasia Catchings on 07/08/19 @ 3:00.  Message sent to Mila Merry at [facility] to schedule surgical consult.  Will give educational material at medical oncology consult.

## 2019-07-06 NOTE — Progress Notes (Signed)
Biopsy note

## 2019-07-07 ENCOUNTER — Encounter: Payer: Self-pay | Admitting: Oncology

## 2019-07-07 NOTE — Progress Notes (Signed)
Patient contacted prior to new pt visit. Referred by Dr.Bozcia for breast cancer. Pt aware of referral. Chart reviewed and updated. No concerns voiced.

## 2019-07-08 ENCOUNTER — Inpatient Hospital Stay: Payer: Medicare HMO

## 2019-07-08 ENCOUNTER — Inpatient Hospital Stay: Payer: Medicare HMO | Attending: Oncology | Admitting: Oncology

## 2019-07-08 ENCOUNTER — Other Ambulatory Visit: Payer: Self-pay | Admitting: *Deleted

## 2019-07-08 ENCOUNTER — Other Ambulatory Visit: Payer: Self-pay

## 2019-07-08 ENCOUNTER — Encounter: Payer: Self-pay | Admitting: *Deleted

## 2019-07-08 VITALS — BP 216/112 | HR 84 | Temp 95.1°F | Resp 16 | Ht 63.39 in | Wt 178.0 lb

## 2019-07-08 DIAGNOSIS — E119 Type 2 diabetes mellitus without complications: Secondary | ICD-10-CM

## 2019-07-08 DIAGNOSIS — I10 Essential (primary) hypertension: Secondary | ICD-10-CM

## 2019-07-08 DIAGNOSIS — E1165 Type 2 diabetes mellitus with hyperglycemia: Secondary | ICD-10-CM | POA: Insufficient documentation

## 2019-07-08 DIAGNOSIS — C50411 Malignant neoplasm of upper-outer quadrant of right female breast: Secondary | ICD-10-CM | POA: Diagnosis not present

## 2019-07-08 DIAGNOSIS — Z7189 Other specified counseling: Secondary | ICD-10-CM

## 2019-07-08 DIAGNOSIS — Z171 Estrogen receptor negative status [ER-]: Secondary | ICD-10-CM | POA: Diagnosis not present

## 2019-07-08 DIAGNOSIS — C50911 Malignant neoplasm of unspecified site of right female breast: Secondary | ICD-10-CM

## 2019-07-08 DIAGNOSIS — Z79899 Other long term (current) drug therapy: Secondary | ICD-10-CM | POA: Diagnosis not present

## 2019-07-08 DIAGNOSIS — Z87891 Personal history of nicotine dependence: Secondary | ICD-10-CM | POA: Diagnosis not present

## 2019-07-08 LAB — COMPREHENSIVE METABOLIC PANEL
ALT: 15 U/L (ref 0–44)
AST: 17 U/L (ref 15–41)
Albumin: 4.3 g/dL (ref 3.5–5.0)
Alkaline Phosphatase: 41 U/L (ref 38–126)
Anion gap: 11 (ref 5–15)
BUN: 17 mg/dL (ref 8–23)
CO2: 25 mmol/L (ref 22–32)
Calcium: 9.5 mg/dL (ref 8.9–10.3)
Chloride: 99 mmol/L (ref 98–111)
Creatinine, Ser: 0.85 mg/dL (ref 0.44–1.00)
GFR calc Af Amer: >60 mL/min (ref >60)
GFR calc non Af Amer: >60 mL/min (ref >60)
Glucose, Bld: 276 mg/dL — ABNORMAL HIGH (ref 70–99)
Potassium: 3.8 mmol/L (ref 3.5–5.1)
Sodium: 135 mmol/L (ref 135–145)
Total Bilirubin: 0.7 mg/dL (ref 0.3–1.2)
Total Protein: 8.3 g/dL — ABNORMAL HIGH (ref 6.5–8.1)

## 2019-07-08 LAB — CBC WITH DIFFERENTIAL/PLATELET
Abs Immature Granulocytes: 0.01 10*3/uL (ref 0.00–0.07)
Basophils Absolute: 0.1 10*3/uL (ref 0.0–0.1)
Basophils Relative: 1 %
Eosinophils Absolute: 0.2 10*3/uL (ref 0.0–0.5)
Eosinophils Relative: 3 %
HCT: 40.2 % (ref 36.0–46.0)
Hemoglobin: 13.2 g/dL (ref 12.0–15.0)
Immature Granulocytes: 0 %
Lymphocytes Relative: 33 %
Lymphs Abs: 2.5 10*3/uL (ref 0.7–4.0)
MCH: 28 pg (ref 26.0–34.0)
MCHC: 32.8 g/dL (ref 30.0–36.0)
MCV: 85.2 fL (ref 80.0–100.0)
Monocytes Absolute: 0.6 10*3/uL (ref 0.1–1.0)
Monocytes Relative: 7 %
Neutro Abs: 4.3 10*3/uL (ref 1.7–7.7)
Neutrophils Relative %: 56 %
Platelets: 241 10*3/uL (ref 150–400)
RBC: 4.72 MIL/uL (ref 3.87–5.11)
RDW: 13.8 % (ref 11.5–15.5)
WBC: 7.6 10*3/uL (ref 4.0–10.5)
nRBC: 0 % (ref 0.0–0.2)

## 2019-07-08 LAB — SURGICAL PATHOLOGY

## 2019-07-08 NOTE — Progress Notes (Signed)
Met patient and her husband today during her medical oncology consult with Dr. Tasia Catchings. Patient self palpated a right breast mass 2-3 weeks ago.  Pathology is still pending molecular studies.  Patient informed of her surgical consult with Dr. Marlou Starks on 07/14/19.  She will follow up with Dr. Tasia Catchings after surgery or prior to surgery pending results of pathology.  Gave patient breast cancer educational literature, "My Breast Cancer Treatment Handbook" by Josephine Igo, RN.  Reviewed services available at the cancer center.  She is to call with any questions or needs.

## 2019-07-08 NOTE — Progress Notes (Addendum)
Hematology/Oncology Consult note [facility] Telephone:([phone] [phone]   Patient Care Team: Lavera Guise, MD as PCP - General (Internal Medicine) Rico Junker, RN as Registered Nurse Theodore Demark, RN as Registered Nurse  REFERRING PROVIDER: Lavera Guise, MD  CHIEF COMPLAINTS/REASON FOR VISIT:  Evaluation of breast cancer  HISTORY OF PRESENTING ILLNESS:   Dawn Alvarez is a  64 y.o.  female with PMH listed below was seen in consultation at the request of  Lavera Guise, MD  for evaluation of breast cancer Patient had screening mammogram done 08/12/2017 which showed right breast asymmetry.  A diagnostic mammogram was suggested and patient did not have it done. Patient felt her right breast mass for a few weeks.  She had bilateral diagnostic mammogram done on 06/30/2019. 2.8 x 2.4 x 2.6 cm right breast mass, 11:00, 10 cm from the nipple.  There is a single mildly abnormal node in the right axilla with a cortex measuring up to 4.4 mm.  No other suspicious findings. Patient underwent ultrasound-guided core biopsy of the right breast mass and right axilla lymph node Pathology showed invasive mammary carcinoma, no special type, grade 3, ER/PR HER-2 status are pending. Right axillary lymph node biopsy showed predominantly blood in the fibroadipose tissue, with scant lymphoid tissue present.  No definite malignancy was identified.  Patient was referred to cancer center to establish care and discuss treatment plan. Menarche 2 Postmenopausal.  LMP when she was 64 years old. She recalls use of birth control pills. Denies any hormone .  Replacement therapy. Denies any prior chest radiation. She reports family history of maternal grandmother and 2 maternal cousins were diagnosed with cancer.  She does not know about details.  Review of Systems  Constitutional: Negative for appetite change, chills, fatigue and fever.  HENT:   Negative for hearing  loss and voice change.   Eyes: Negative for eye problems.  Respiratory: Negative for chest tightness and cough.   Cardiovascular: Negative for chest pain.  Gastrointestinal: Negative for abdominal distention, abdominal pain and blood in stool.  Endocrine: Negative for hot flashes.  Genitourinary: Negative for difficulty urinating and frequency.   Musculoskeletal: Negative for arthralgias.  Skin: Negative for itching and rash.  Neurological: Negative for extremity weakness.  Hematological: Negative for adenopathy.  Psychiatric/Behavioral: Negative for confusion.    MEDICAL HISTORY:  Past Medical History:  Diagnosis Date  . Asthma   . COPD exacerbation ([place]) 04/12/2016  . Diabetes mellitus without complication ([place])   . Hyperlipemia   . Hypertension     SURGICAL HISTORY: Past Surgical History:  Procedure Laterality Date  . BREAST BIOPSY Right 07/05/2019   [place] bx venus marker, path pending  . BREAST BIOPSY Right 07/05/2019   LN bx, hydromarker, path pending  . CESAREAN SECTION    . COLONOSCOPY WITH PROPOFOL N/A 09/11/2015   Procedure: COLONOSCOPY WITH PROPOFOL;  Surgeon: Lucilla Lame, MD;  Location: [facility] ENDOSCOPY;  Service: Endoscopy;  Laterality: N/A;    SOCIAL HISTORY: Social History   Socioeconomic History  . Marital status: Married    Spouse name: Not on file  . Number of children: Not on file  . Years of education: Not on file  . Highest education level: Not on file  Occupational History  . Not on file  Tobacco Use  . Smoking status: Former Smoker    Packs/day: 1.00    Years: 2.00    Pack years: 2.00    Types: Cigarettes  .  Smokeless tobacco: Never Used  Substance and Sexual Activity  . Alcohol use: Yes    Alcohol/week: 2.0 standard drinks    Types: 2 Cans of beer per week    Comment: weekends   . Drug use: No  . Sexual activity: Never    Birth control/protection: Abstinence  Other Topics Concern  . Not on file  Social History Narrative  . Not on file     Social Determinants of Health   Financial Resource Strain:   . Difficulty of Paying Living Expenses: Not on file  Food Insecurity:   . Worried About Charity fundraiser in the Last Year: Not on file  . Ran Out of Food in the Last Year: Not on file  Transportation Needs:   . Lack of Transportation (Medical): Not on file  . Lack of Transportation (Non-Medical): Not on file  Physical Activity:   . Days of Exercise per Week: Not on file  . Minutes of Exercise per Session: Not on file  Stress:   . Feeling of Stress : Not on file  Social Connections:   . Frequency of Communication with Friends and Family: Not on file  . Frequency of Social Gatherings with Friends and Family: Not on file  . Attends Religious Services: Not on file  . Active Member of Clubs or Organizations: Not on file  . Attends Archivist Meetings: Not on file  . Marital Status: Not on file  Intimate Partner Violence:   . Fear of Current or Ex-Partner: Not on file  . Emotionally Abused: Not on file  . Physically Abused: Not on file  . Sexually Abused: Not on file    FAMILY HISTORY: Family History  Problem Relation Age of Onset  . Diabetes Mother   . Hypertension Mother   . Hypertension Father   . Diabetes Father     ALLERGIES:  has No Known Allergies.  MEDICATIONS:  Current Outpatient Medications  Medication Sig Dispense Refill  . amLODipine (NORVASC) 10 MG tablet Take 1 tablet (10 mg total) by mouth daily. 30 tablet 5  . bisoprolol-hydrochlorothiazide (ZIAC) 10-6.25 MG tablet Take 1 tablet by mouth daily. 30 tablet 2  . glucose blood (ACCU-CHEK SMARTVIEW) test strip 1 each by Other route as needed for other. Use as directed twice a day diag E11.65    . hydrALAZINE (APRESOLINE) 50 MG tablet Take 1 tablet (50 mg total) by mouth 3 (three) times daily. 90 tablet 3  . insulin detemir (LEVEMIR) 100 UNIT/ML injection Inject 0.4 mLs (40 Units total) into the skin daily. 10 mL 5  . Insulin Pen Needle  (PEN NEEDLES) 31G X 8 MM MISC Use as directed with insulin E11.65 100 each 1  . Lancets Misc. (ACCU-CHEK MULTICLIX LANCET DEV) KIT by Does not apply route. Use as directed twice a day diag E11.65    . lisinopril (ZESTRIL) 20 MG tablet Take 1 tablet (20 mg total) by mouth daily. 30 tablet 3  . Omega-3 Fatty Acids (FISH OIL) 1000 MG CAPS Take by mouth.    . Semaglutide, 1 MG/DOSE, (OZEMPIC, 1 MG/DOSE,) 2 MG/1.5ML SOPN Inject 1 mg into the skin once a week. 5 pen 3   No current facility-administered medications for this visit.     PHYSICAL EXAMINATION: ECOG PERFORMANCE STATUS: 0 - Asymptomatic Vitals:   07/08/19 0854  BP: (!) 216/112  Pulse: 84  Resp: 16  Temp: (!) 95.1 F (35.1 C)   Filed Weights   07/08/19 0854  Weight:  178 lb (80.7 kg)    Physical Exam Constitutional:      General: She is not in acute distress. HENT:     Head: Normocephalic and atraumatic.  Eyes:     General: No scleral icterus. Cardiovascular:     Rate and Rhythm: Normal rate and regular rhythm.     Heart sounds: Normal heart sounds.  Pulmonary:     Effort: Pulmonary effort is normal. No respiratory distress.     Breath sounds: No wheezing.  Abdominal:     General: Bowel sounds are normal. There is no distension.     Palpations: Abdomen is soft.  Musculoskeletal:        General: No deformity. Normal range of motion.     Cervical back: Normal range of motion and neck supple.  Skin:    General: Skin is warm and dry.     Findings: No erythema or rash.  Neurological:     Mental Status: She is alert and oriented to person, place, and time. Mental status is at baseline.     Cranial Nerves: No cranial nerve deficit.     Coordination: Coordination normal.  Psychiatric:        Mood and Affect: Mood normal.   Breast exam was performed in seated and lying down position. Palpable right 11:00 mass, about 3 cm.  No palpable axillary lymphadenopathy bilaterally.  No palpable left breast  mass.    LABORATORY DATA:  I have reviewed the data as listed Lab Results  Component Value Date   WBC 7.6 07/08/2019   HGB 13.2 07/08/2019   HCT 40.2 07/08/2019   MCV 85.2 07/08/2019   PLT 241 07/08/2019   Recent Labs    12/28/18 0017 07/08/19 0937  NA 135 135  K 2.8* 3.8  CL 100 99  CO2 19* 25  GLUCOSE 228* 276*  BUN 13 17  CREATININE 0.75 0.85  CALCIUM 9.6 9.5  GFRNONAA >60 >60  GFRAA >60 >60  PROT 7.8 8.3*  ALBUMIN 4.0 4.3  AST 18 17  ALT 17 15  ALKPHOS 47 41  BILITOT 0.4 0.7   Iron/TIBC/Ferritin/ %Sat No results found for: IRON, TIBC, FERRITIN, IRONPCTSAT    RADIOGRAPHIC STUDIES: I have personally reviewed the radiological images as listed and agreed with the findings in the report.  US BREAST LTD UNI RIGHT INC AXILLA  Result Date: 06/30/2019 CLINICAL DATA:  Palpable lump in the right breast EXAM: DIGITAL DIAGNOSTIC BILATERAL MAMMOGRAM WITH CAD AND TOMO ULTRASOUND RIGHT BREAST COMPARISON:  Previous exam(s). ACR Breast Density Category b: There are scattered areas of fibroglandular density. FINDINGS: There is a spiculated mass in the region of the patient's palpable lump measuring 3.8 cm mammographically. No other suspicious findings are identified in either breast. Mammographic images were processed with CAD. On physical exam, there is a palpable lump in the right breast at 11 o'clock, 10 cm from the nipple. Targeted ultrasound is performed, showing an irregular hypoechoic mass at 11 o'clock, 10 cm from the nipple measuring 2.8 x 2.4 x 2.6 cm. There is a single mildly abnormal node in the low right axilla with a cortex measuring up to 4.4 mm. No other suspicious findings. IMPRESSION: Highly suspicious right breast mass. Mildly abnormal right axillary node. No abnormalities on the left. RECOMMENDATION: Recommend ultrasound-guided biopsy of the right breast mass and a mildly abnormal right axillary node. I have discussed the findings and recommendations with the patient.  If applicable, a reminder letter will be sent to the patient  regarding the next appointment. BI-RADS CATEGORY  5: Highly suggestive of malignancy. Electronically Signed   By: Dorise Bullion III M.D   On: 06/30/2019 12:19   MM DIAG BREAST TOMO BILATERAL  Result Date: 06/30/2019 CLINICAL DATA:  Palpable lump in the right breast EXAM: DIGITAL DIAGNOSTIC BILATERAL MAMMOGRAM WITH CAD AND TOMO ULTRASOUND RIGHT BREAST COMPARISON:  Previous exam(s). ACR Breast Density Category b: There are scattered areas of fibroglandular density. FINDINGS: There is a spiculated mass in the region of the patient's palpable lump measuring 3.8 cm mammographically. No other suspicious findings are identified in either breast. Mammographic images were processed with CAD. On physical exam, there is a palpable lump in the right breast at 11 o'clock, 10 cm from the nipple. Targeted ultrasound is performed, showing an irregular hypoechoic mass at 11 o'clock, 10 cm from the nipple measuring 2.8 x 2.4 x 2.6 cm. There is a single mildly abnormal node in the low right axilla with a cortex measuring up to 4.4 mm. No other suspicious findings. IMPRESSION: Highly suspicious right breast mass. Mildly abnormal right axillary node. No abnormalities on the left. RECOMMENDATION: Recommend ultrasound-guided biopsy of the right breast mass and a mildly abnormal right axillary node. I have discussed the findings and recommendations with the patient. If applicable, a reminder letter will be sent to the patient regarding the next appointment. BI-RADS CATEGORY  5: Highly suggestive of malignancy. Electronically Signed   By: Dorise Bullion III M.D   On: 06/30/2019 12:19   MM CLIP PLACEMENT RIGHT  Result Date: 07/05/2019 CLINICAL DATA:  Ultrasound-guided core needle biopsies were performed of a suspicious palpable mass upper-outer quadrant right breast and an axillary lymph node with focal cortical thickening. EXAM: DIAGNOSTIC RIGHT MAMMOGRAM POST ULTRASOUND  BIOPSIES COMPARISON:  Previous exam(s). FINDINGS: Mammographic images were obtained following ultrasound guided biopsy of a right breast mass in the upper-outer quadrant and ultrasound-guided biopsy of a right axillary lymph node. The Venus biopsy marking clip is in expected position at the site of the biopsied breast mass. HydroMARK biopsy clip is in the expected location of the biopsied lymph node. There are post biopsy changes surrounding the axillary biopsy clip. IMPRESSION: Appropriate positioning of the Venus and HydroMARK biopsy marking clips in the right breast and right axilla. Final Assessment: Post Procedure Mammograms for Marker Placement Electronically Signed   By: Curlene Dolphin M.D.   On: 07/05/2019 09:19   [place] RT BREAST BX W LOC DEV 1ST LESION IMG BX SPEC US GUIDE  Addendum Date: 07/06/2019   ADDENDUM REPORT: 07/06/2019 11:53 ADDENDUM: Pathology revealed GRADE III INVASIVE MAMMARY CARCINOMA, NO SPECIAL TYPE of the RIGHT breast at 10-11 o'clock, 10 cm from nipple. This was found to be concordant by Dr. Curlene Dolphin. Pathology revealed PREDOMINANTLY BLOOD AND FIBROADIPOSE TISSUE, WITH SCANT LYMPHOID TISSUE PRESENT of the RIGHT axillary lymph node. This was found to be concordant by Dr. Curlene Dolphin. Correlate with sentinel lymph node biopsy. Pathology results were discussed with the patient by telephone. The patient reported doing well after the biopsy with tenderness at the site. Post biopsy instructions and care were reviewed and questions were answered. The patient was encouraged to call [facility] [place] [facility] for any additional concerns. Surgical and Oncologist referral will be arranged by Al Pimple RN and Tanya Nones RN of [place]. Pathology results reported by Stacie Acres RN on 07/06/2019. Electronically Signed   By: Curlene Dolphin M.D.   On: 07/06/2019 11:53  Result Date: 07/06/2019 CLINICAL DATA:  Ultrasound-guided core  needle biopsy was recommended of a suspicious palpable mass in the upper-outer quadrant of the right breast. EXAM: ULTRASOUND GUIDED RIGHT BREAST CORE NEEDLE BIOPSY COMPARISON:  Previous exam(s). FINDINGS: I met with the patient and we discussed the procedure of ultrasound-guided biopsy, including benefits and alternatives. We discussed the high likelihood of a successful procedure. We discussed the risks of the procedure, including infection, bleeding, tissue injury, clip migration, and inadequate sampling. Informed written consent was given. The usual time-out protocol was performed immediately prior to the procedure. Lesion quadrant: Upper outer quadrant Using sterile technique and 1% Lidocaine as local anesthetic, under direct ultrasound visualization, a 12 gauge spring-loaded device was used to perform biopsy of a 2.8 cm palpable mass in the 10-11 o'clock axis of the right breast approximately 10 cm from the nipple using a lateral approach. At the conclusion of the procedure Venus tissue marker clip was deployed into the biopsy cavity. Follow up 2 view mammogram was performed and dictated separately. IMPRESSION: Ultrasound guided biopsy of the right breast. No apparent complications. Electronically Signed: By: Curlene Dolphin M.D. On: 07/05/2019 09:15   [place] RT BREAST BX W LOC DEV EA ADD LESION IMG BX SPEC US GUIDE  Addendum Date: 07/06/2019   ADDENDUM REPORT: 07/06/2019 11:56 ADDENDUM: Pathology revealed GRADE III INVASIVE MAMMARY CARCINOMA, NO SPECIAL TYPE of the RIGHT breast at 10-11 o'clock, 10 cm from nipple. This was found to be concordant by Dr. Curlene Dolphin. Pathology revealed PREDOMINANTLY BLOOD AND FIBROADIPOSE TISSUE, WITH SCANT LYMPHOID TISSUE PRESENT of the RIGHT axillary lymph node. This was found to be concordant by Dr. Curlene Dolphin. Correlate with sentinel lymph node biopsy. Pathology results were discussed with the patient by telephone. The patient reported doing well after the biopsy with  tenderness at the site. Post biopsy instructions and care were reviewed and questions were answered. The patient was encouraged to call [facility] [place] [facility] for any additional concerns. Surgical and Oncologist referral will be arranged by Al Pimple RN and Tanya Nones RN of [place]. Pathology results reported by Stacie Acres RN on 07/06/2019. Electronically Signed   By: Curlene Dolphin M.D.   On: 07/06/2019 11:56   Result Date: 07/06/2019 CLINICAL DATA:  Ultrasound-guided core needle biopsy was recommended of a right axillary lymph node with cortical thickening. EXAM: ULTRASOUND GUIDED RIGHT AXILLA CORE NEEDLE BIOPSY COMPARISON:  Previous exam(s). FINDINGS: I met with the patient and we discussed the procedure of ultrasound-guided biopsy, including benefits and alternatives. We discussed the high likelihood of a successful procedure. We discussed the risks of the procedure, including infection, bleeding, tissue injury, clip migration, and inadequate sampling. Informed written consent was given. The usual time-out protocol was performed immediately prior to the procedure. Using sterile technique and 1% Lidocaine as local anesthetic, under direct ultrasound visualization, a 14 gauge spring-loaded device was used to perform biopsy of an axillary lymph node with focal cortical thickening using a lateral approach. At the conclusion of the procedure [facility] tissue marker clip was deployed into the biopsy cavity. Follow up 2 view mammogram was performed and dictated separately. IMPRESSION: Ultrasound guided biopsy of a right axillary lymph node. No apparent complications. Electronically Signed: By: Curlene Dolphin M.D. On: 07/05/2019 09:16      ASSESSMENT & PLAN:  1. Malignant neoplasm of upper-outer quadrant of right breast in female, estrogen receptor negative ([place])   2. Goals of care, counseling/discussion  3. Uncontrolled hypertension     Images were independently reviewed by me and discussed with patient and husband. Pathology report was reviewed and discussed with patient. cT2N0 grade 3 invasive mammary carcinoma.  ER PR HER-2 status were pending Discussed with patient that her treatment plan will need to be finalized after ER/PR HER-2 status were reported. She has an appointment to see Dr. Marlou Starks for surgical evaluation.  # Uncontrolled HTN, she did not take her antihypertensive medication this morning.She is asymptomatic.  Advised patient to be compliant with current medication.  Continue to monitor.  Advised patient to measure blood pressure at home and if blood pressures persistently high, she needs to go to emergency room.  4:15pm Pathology came back to ER negative, PR weakly positive 1-10%, HER2 negative.  I recommend neoadjuvant chemotherapy with AC followed by Taxol and carboplatin.  Adding carboplatin has demonstrated improved pathological complete response rate in addition to Adriamycin-containing neoadjuvant regimen. I called patient and her husband and updated him about the new information. We discussed that her breast cancer type is aggressive and I recommend chemotherapy before surgery  She agrees. I explained to the patient the risks and benefits of chemotherapy including all but not limited to infusion reaction, hair loss, hearing loss, mouth sore, nausea, vomiting, low blood counts, bleeding, heart failure, kidney failure, neuropathy and risk of life threatening infection and even death, secondary malignancy etc.  Patient voices understanding and willing to proceed chemotherapy.  Obtain 2D-Echo.  Goal of care: curative, discussed.  # Chemotherapy education; refer to Dr.Toth for port placement.  Refer to genetic counselor for genetic testing. Patient's case will be discussed on breast cancer tumor board next week.   Orders Placed This Encounter  Procedures  . CBC with Differential/Platelet    Standing  Status:   Future    Number of Occurrences:   1    Standing Expiration Date:   07/07/2020  . Comprehensive metabolic panel    Standing Status:   Future    Number of Occurrences:   1    Standing Expiration Date:   07/07/2020  . Cancer antigen 15-3    Standing Status:   Future    Number of Occurrences:   1    Standing Expiration Date:   07/07/2020  . CA 27.29 (SERIAL MONITOR)    Standing Status:   Future    Number of Occurrences:   1    Standing Expiration Date:   07/07/2020    All questions were answered. The patient knows to call the clinic with any problems questions or concerns.  cc Lavera Guise, MD    Return of visit: To be determined Thank you for this kind referral and the opportunity to participate in the care of this patient. A copy of today's note is routed to referring provider  Earlie Server, MD, PhD Hematology Oncology [facility] Pager- [phone] 07/08/2019

## 2019-07-08 NOTE — Progress Notes (Signed)
START ON PATHWAY REGIMEN - Breast     A cycle is every 14 days (cycles 1-4):     Doxorubicin      Cyclophosphamide      Pegfilgrastim-xxxx    A cycle is every 21 days (cycles 5-8):     Paclitaxel      Carboplatin   **Always confirm dose/schedule in your pharmacy ordering system**  Patient Characteristics: Preoperative or Nonsurgical Candidate (Clinical Staging), Neoadjuvant Therapy followed by Surgery, Invasive Disease, Chemotherapy, HER2 Negative/Unknown/Equivocal, ER Negative/Unknown, Platinum Therapy Indicated Therapeutic Status: Preoperative or Nonsurgical Candidate (Clinical Staging) AJCC M Category: cM0 AJCC Grade: G3 Breast Surgical Plan: Neoadjuvant Therapy followed by Surgery ER Status: Negative (-) AJCC 8 Stage Grouping: IIB HER2 Status: Negative (-) AJCC T Category: cT2 AJCC N Category: cN0 PR Status: Positive (+) Type of Therapy: Platinum Therapy Indicated Intent of Therapy: Curative Intent, Discussed with Patient

## 2019-07-09 LAB — CANCER ANTIGEN 15-3: CA 15-3: 14.2 U/mL (ref 0.0–25.0)

## 2019-07-10 LAB — CA 27.29 (SERIAL MONITOR): CA 27.29: 16.6 U/mL (ref 0.0–38.6)

## 2019-07-10 NOTE — Progress Notes (Signed)
Invasive mammary carcinoma

## 2019-07-10 NOTE — Progress Notes (Signed)
Treatment per oncology/surgery

## 2019-07-11 ENCOUNTER — Encounter: Payer: Self-pay | Admitting: *Deleted

## 2019-07-11 NOTE — Progress Notes (Signed)
Tried to call patient and her husband today to review plan of care.  No answer and no voicemail

## 2019-07-12 DIAGNOSIS — H2513 Age-related nuclear cataract, bilateral: Secondary | ICD-10-CM | POA: Diagnosis not present

## 2019-07-12 DIAGNOSIS — H40003 Preglaucoma, unspecified, bilateral: Secondary | ICD-10-CM | POA: Diagnosis not present

## 2019-07-13 ENCOUNTER — Encounter: Payer: Self-pay | Admitting: *Deleted

## 2019-07-13 ENCOUNTER — Inpatient Hospital Stay: Payer: Medicare HMO | Admitting: Oncology

## 2019-07-13 ENCOUNTER — Telehealth: Payer: Self-pay | Admitting: *Deleted

## 2019-07-13 ENCOUNTER — Inpatient Hospital Stay: Payer: Medicare HMO

## 2019-07-13 NOTE — Telephone Encounter (Signed)
Pt was a No Show for her scheduled 07/13/19 Chemo Edu class appt Per Rosa to R/S.  I tried calling pt x3 no answer/and was unable to leave her a vmail.

## 2019-07-13 NOTE — Progress Notes (Signed)
Patient missed her chemo class appointment today.  I have left her and her husband a message to return my call.

## 2019-07-14 ENCOUNTER — Encounter: Payer: Self-pay | Admitting: *Deleted

## 2019-07-14 DIAGNOSIS — C50411 Malignant neoplasm of upper-outer quadrant of right female breast: Secondary | ICD-10-CM | POA: Diagnosis not present

## 2019-07-14 DIAGNOSIS — Z171 Estrogen receptor negative status [ER-]: Secondary | ICD-10-CM | POA: Diagnosis not present

## 2019-07-14 NOTE — Progress Notes (Signed)
I have called patient numerous times and left messages and have not been able to get in touch with her.  I talked to her husband today.  He was with her at home.  Patient states she has not received any calls.  She thinks her phone is not working.  Explained that she missed her appointment for her chemo class yesterday.  Patient is not aware of her appointments.  Explained we would get her chemo class rescheduled, and informed her of the appointment for her ECHO on 07/25/18.  Reminded of her appointment with Dr. Marlou Starks this afternoon.  Requested that the schedulers call the patient's husband with future appointments.  She and her husband are agreeable.

## 2019-07-15 ENCOUNTER — Telehealth: Payer: Self-pay

## 2019-07-15 ENCOUNTER — Other Ambulatory Visit: Payer: Self-pay | Admitting: General Surgery

## 2019-07-15 DIAGNOSIS — Z171 Estrogen receptor negative status [ER-]: Secondary | ICD-10-CM

## 2019-07-15 DIAGNOSIS — C50411 Malignant neoplasm of upper-outer quadrant of right female breast: Secondary | ICD-10-CM

## 2019-07-15 NOTE — OR Nursing (Signed)
Husband called with pre port insertion instructions. Light breakfast before 0830 due to diabetes. Instructed to call us if she feels her blood sugar is dropping. Instructed to take cardiac meds but not diabetes medication morning of procedure.

## 2019-07-15 NOTE — Telephone Encounter (Signed)
Pt missed chemo care & chemo class *NEW*AC -taxol carboplatin appts. Please reschedule. Pt requests for husband's phone to be called with appts. Thank you

## 2019-07-15 NOTE — Telephone Encounter (Signed)
Done...  Pt appts has been R/S as requested. Pts husband is aware of her New appt date and time.

## 2019-07-19 ENCOUNTER — Telehealth: Payer: Self-pay

## 2019-07-19 ENCOUNTER — Other Ambulatory Visit: Payer: Self-pay | Admitting: Radiology

## 2019-07-19 ENCOUNTER — Encounter: Payer: Self-pay | Admitting: *Deleted

## 2019-07-19 ENCOUNTER — Other Ambulatory Visit: Payer: Self-pay | Admitting: Oncology

## 2019-07-19 DIAGNOSIS — Z171 Estrogen receptor negative status [ER-]: Secondary | ICD-10-CM

## 2019-07-19 DIAGNOSIS — C50411 Malignant neoplasm of upper-outer quadrant of right female breast: Secondary | ICD-10-CM

## 2019-07-19 MED ORDER — LIDOCAINE-PRILOCAINE 2.5-2.5 % EX CREA: TOPICAL_CREAM | CUTANEOUS | 3 refills | Status: DC

## 2019-07-19 MED ORDER — PROCHLORPERAZINE MALEATE 10 MG PO TABS: 10.0000 mg | ORAL_TABLET | Freq: Four times a day (QID) | ORAL | 1 refills | Status: DC | PRN

## 2019-07-19 MED ORDER — DEXAMETHASONE 4 MG PO TABS: ORAL_TABLET | ORAL | 1 refills | Status: DC

## 2019-07-19 NOTE — Telephone Encounter (Signed)
Sheena, do you mind calling patient and letting her know this new appt info and about the premeds that Dr. Tasia Catchings has sent to pharmacy.

## 2019-07-19 NOTE — Telephone Encounter (Signed)
Opened in error

## 2019-07-19 NOTE — Telephone Encounter (Signed)
2D echo has been cancelled and MUGA has been scheduled for 2/18. Ellison Hughs, please schedule patient to start chemo late this week or early next week : lab/MD/ DDAC *new*. Pt will also need covid testing a few days prior to start date. Will you please give her new appt details and advise her to call [phone] to set up covid testing this week please.

## 2019-07-19 NOTE — Progress Notes (Signed)
Worked with Dr. Collie Siad team to get patients appointment for MUGA, Chemo class and chemo scheduled.  Called patient to review her multiple appointments.  Patient could not repeat appointment date and times.  States she is too overwhelmed and can't think.  I asked if I could give them to her husband, but she said he was not home, but to please call her sister Dawn Alvarez, and go over all her appointments.  I spoke with Dawn Alvarez, the patients sister and reviewed all appointment date and times.  Dawn Alvarez verbalizes all appointment dates and times.  She has the number to call to schedule her COVID test.  She asked about inclement weather on Thursday and what if she would not travel.  I gave Dawn Alvarez the cancer center main number to call and she has my number to call if patient has to reschedule.  They are to call with any questions or needs.

## 2019-07-20 ENCOUNTER — Other Ambulatory Visit: Payer: Self-pay

## 2019-07-20 ENCOUNTER — Other Ambulatory Visit: Payer: Self-pay | Admitting: Oncology

## 2019-07-20 ENCOUNTER — Ambulatory Visit
Admission: RE | Admit: 2019-07-20 | Discharge: 2019-07-20 | Disposition: A | Discharge status: Home / Self Care | Payer: Medicare HMO | Source: Ambulatory Visit | Attending: General Surgery | Admitting: General Surgery

## 2019-07-20 ENCOUNTER — Telehealth: Payer: Self-pay

## 2019-07-20 DIAGNOSIS — Z794 Long term (current) use of insulin: Secondary | ICD-10-CM | POA: Insufficient documentation

## 2019-07-20 DIAGNOSIS — I10 Essential (primary) hypertension: Secondary | ICD-10-CM | POA: Diagnosis not present

## 2019-07-20 DIAGNOSIS — Z171 Estrogen receptor negative status [ER-]: Secondary | ICD-10-CM | POA: Insufficient documentation

## 2019-07-20 DIAGNOSIS — J449 Chronic obstructive pulmonary disease, unspecified: Secondary | ICD-10-CM | POA: Insufficient documentation

## 2019-07-20 DIAGNOSIS — E119 Type 2 diabetes mellitus without complications: Secondary | ICD-10-CM | POA: Diagnosis not present

## 2019-07-20 DIAGNOSIS — Z79899 Other long term (current) drug therapy: Secondary | ICD-10-CM | POA: Insufficient documentation

## 2019-07-20 DIAGNOSIS — Z87891 Personal history of nicotine dependence: Secondary | ICD-10-CM | POA: Insufficient documentation

## 2019-07-20 DIAGNOSIS — E785 Hyperlipidemia, unspecified: Secondary | ICD-10-CM | POA: Diagnosis not present

## 2019-07-20 DIAGNOSIS — Z452 Encounter for adjustment and management of vascular access device: Secondary | ICD-10-CM | POA: Diagnosis not present

## 2019-07-20 DIAGNOSIS — C50411 Malignant neoplasm of upper-outer quadrant of right female breast: Secondary | ICD-10-CM | POA: Diagnosis not present

## 2019-07-20 DIAGNOSIS — C50911 Malignant neoplasm of unspecified site of right female breast: Secondary | ICD-10-CM | POA: Diagnosis not present

## 2019-07-20 DIAGNOSIS — Z5111 Encounter for antineoplastic chemotherapy: Secondary | ICD-10-CM | POA: Diagnosis not present

## 2019-07-20 DIAGNOSIS — C50011 Malignant neoplasm of nipple and areola, right female breast: Secondary | ICD-10-CM | POA: Diagnosis not present

## 2019-07-20 HISTORY — PX: IR IMAGING GUIDED PORT INSERTION: IMG5740

## 2019-07-20 LAB — CBC
HCT: 37.2 % (ref 36.0–46.0)
Hemoglobin: 12.3 g/dL (ref 12.0–15.0)
MCH: 28 pg (ref 26.0–34.0)
MCHC: 33.1 g/dL (ref 30.0–36.0)
MCV: 84.7 fL (ref 80.0–100.0)
Platelets: 258 10*3/uL (ref 150–400)
RBC: 4.39 MIL/uL (ref 3.87–5.11)
RDW: 13.7 % (ref 11.5–15.5)
WBC: 8 10*3/uL (ref 4.0–10.5)
nRBC: 0 % (ref 0.0–0.2)

## 2019-07-20 LAB — PROTIME-INR
INR: 1 (ref 0.8–1.2)
Prothrombin Time: 13.3 seconds (ref 11.4–15.2)

## 2019-07-20 LAB — GLUCOSE, CAPILLARY: Glucose-Capillary: 185 mg/dL — ABNORMAL HIGH (ref 70–99)

## 2019-07-20 MED ORDER — FENTANYL CITRATE (PF) 100 MCG/2ML IJ SOLN
INTRAMUSCULAR | Status: AC
  Filled 2019-07-20: qty 2

## 2019-07-20 MED ORDER — HEPARIN SOD (PORK) LOCK FLUSH 100 UNIT/ML IV SOLN
INTRAVENOUS | Status: AC
  Filled 2019-07-20: qty 5

## 2019-07-20 MED ORDER — FENTANYL CITRATE (PF) 100 MCG/2ML IJ SOLN
INTRAMUSCULAR | Status: AC | PRN
  Administered 2019-07-20: 50 ug via INTRAVENOUS

## 2019-07-20 MED ORDER — HYDRALAZINE HCL 20 MG/ML IJ SOLN
INTRAMUSCULAR | Status: AC
  Administered 2019-07-20: 10 mg
  Filled 2019-07-20: qty 1

## 2019-07-20 MED ORDER — MIDAZOLAM HCL 2 MG/2ML IJ SOLN
INTRAMUSCULAR | Status: AC
  Filled 2019-07-20: qty 2

## 2019-07-20 MED ORDER — MIDAZOLAM HCL 5 MG/5ML IJ SOLN
INTRAMUSCULAR | Status: AC | PRN
  Administered 2019-07-20: 1 mg via INTRAVENOUS

## 2019-07-20 MED ORDER — CEFAZOLIN SODIUM-DEXTROSE 2-4 GM/100ML-% IV SOLN: 2.0000 g | Freq: Once | INTRAVENOUS | Status: DC

## 2019-07-20 MED ORDER — CEFAZOLIN SODIUM-DEXTROSE 2-4 GM/100ML-% IV SOLN
INTRAVENOUS | Status: AC
  Administered 2019-07-20: 2000 mg
  Filled 2019-07-20: qty 100

## 2019-07-20 NOTE — Telephone Encounter (Signed)
Spoke with Orpah Melter, pt sister and rescheduled chemo class for Monday 07/25/2019 at 9:00 am.

## 2019-07-20 NOTE — Procedures (Signed)
Interventional Radiology Procedure Note  Procedure: Single Lumen Power Port Placement    Access:  Left IJ vein.  Findings: Catheter tip positioned at SVC/RA junction. Port is ready for immediate use.   Complications: None  EBL: < 10 mL  Recommendations:  - Ok to shower in 24 hours - Do not submerge for 7 days - Routine line care   Concepcion Kirkpatrick T. Viliami Bracco, M.D Pager:  [phone]   

## 2019-07-20 NOTE — H&P (Signed)
Chief Complaint: Patient was seen in consultation today for port placement at the request of Toth,Paul III  Referring Physician(s): Toth,Paul III  Patient Status: [facility] - Out-pt  History of Present Illness: Dawn Alvarez is a 64 y.o. female with a recent diagnosis of breast cancer. A 2.8 x 2.4 x 2.6 cm right breast mass, 11:00, 10 cm from the nipple.  There is a single mildly abnormal node in the right axilla with a cortex measuring up to 4.4 mm.  No other suspicious findings. Patient underwent ultrasound-guided core biopsy of the right breast mass and right axilla lymph node.  Pathology showed invasive mammary carcinoma, no special type, grade 3, ER/PR HER-2 status are pending. Right axillary lymph node biopsy showed predominantly blood in the fibroadipose tissue, with scant lymphoid tissue present.  No definite malignancy was identified.  She presents today for port placement for planned chemotherapy. She is asymptomatic. Significantly hypertensive today. Took one of her four antihypertensives this AM.  Past Medical History:  Diagnosis Date  . Asthma   . COPD exacerbation ([place]) 04/12/2016  . Diabetes mellitus without complication ([place])   . Hyperlipemia   . Hypertension     Past Surgical History:  Procedure Laterality Date  . BREAST BIOPSY Right 07/05/2019   [place] bx venus marker, path pending  . BREAST BIOPSY Right 07/05/2019   LN bx, hydromarker, path pending  . CESAREAN SECTION    . COLONOSCOPY WITH PROPOFOL N/A 09/11/2015   Procedure: COLONOSCOPY WITH PROPOFOL;  Surgeon: Lucilla Lame, MD;  Location: [facility] ENDOSCOPY;  Service: Endoscopy;  Laterality: N/A;    Allergies: Patient has no known allergies.  Medications: Prior to Admission medications   Medication Sig Start Date End Date Taking? Authorizing Provider  amLODipine (NORVASC) 10 MG tablet Take 1 tablet (10 mg total) by mouth daily. 01/05/19  Yes Scarboro, Audie Clear, NP  bisoprolol-hydrochlorothiazide (ZIAC) 10-6.25  MG tablet Take 1 tablet by mouth daily. 12/29/18  Yes Scarboro, Audie Clear, NP  hydrALAZINE (APRESOLINE) 50 MG tablet Take 1 tablet (50 mg total) by mouth 3 (three) times daily. 01/05/19  Yes Scarboro, Audie Clear, NP  insulin detemir (LEVEMIR) 100 UNIT/ML injection Inject 0.4 mLs (40 Units total) into the skin daily. 06/14/19  Yes Boscia, Heather E, NP  lisinopril (ZESTRIL) 20 MG tablet Take 1 tablet (20 mg total) by mouth daily. 04/25/19  Yes Scarboro, Audie Clear, NP  Omega-3 Fatty Acids (FISH OIL) 1000 MG CAPS Take 1,000 mg by mouth daily.  04/12/16  Yes [provider]  Semaglutide, 1 MG/DOSE, (OZEMPIC, 1 MG/DOSE,) 2 MG/1.5ML SOPN Inject 1 mg into the skin once a week. 03/09/19  Yes Scarboro, Audie Clear, NP  dexamethasone (DECADRON) 4 MG tablet Take 2 tablets by mouth daily starting the day after Carboplatin and Cytoxan x 2 days. Take with food. 07/19/19   Earlie Server, MD  glucose blood (ACCU-CHEK SMARTVIEW) test strip 1 each by Other route as needed for other. Use as directed twice a day diag E11.65    [provider]  Insulin Pen Needle (PEN NEEDLES) 31G X 8 MM MISC Use as directed with insulin E11.65 03/30/18   [name], NP  Lancets Misc. (ACCU-CHEK MULTICLIX LANCET DEV) KIT by Does not apply route. Use as directed twice a day diag E11.65    [provider]  lidocaine-prilocaine (EMLA) cream Apply to affected area once 07/19/19   Earlie Server, MD  prochlorperazine (COMPAZINE) 10 MG tablet Take 1 tablet (10 mg total) by mouth  every 6 (six) hours as needed (Nausea or vomiting). 07/19/19   Earlie Server, MD     Family History  Problem Relation Age of Onset  . Diabetes Mother   . Hypertension Mother   . Hypertension Father   . Diabetes Father     Social History   Socioeconomic History  . Marital status: Married    Spouse name: Not on file  . Number of children: Not on file  . Years of education: Not on file  . Highest education level: Not on file  Occupational History  . Not on file    Tobacco Use  . Smoking status: Former Smoker    Packs/day: 1.00    Years: 2.00    Pack years: 2.00    Types: Cigarettes    Quit date: 05/19/2019    Years since quitting: 0.1  . Smokeless tobacco: Never Used  Substance and Sexual Activity  . Alcohol use: Yes    Alcohol/week: 2.0 standard drinks    Types: 2 Cans of beer per week    Comment: weekends   . Drug use: No  . Sexual activity: Never    Birth control/protection: Abstinence  Other Topics Concern  . Not on file  Social History Narrative  . Not on file   Social Determinants of Health   Financial Resource Strain:   . Difficulty of Paying Living Expenses: Not on file  Food Insecurity:   . Worried About Charity fundraiser in the Last Year: Not on file  . Ran Out of Food in the Last Year: Not on file  Transportation Needs:   . Lack of Transportation (Medical): Not on file  . Lack of Transportation (Non-Medical): Not on file  Physical Activity:   . Days of Exercise per Week: Not on file  . Minutes of Exercise per Session: Not on file  Stress:   . Feeling of Stress : Not on file  Social Connections:   . Frequency of Communication with Friends and Family: Not on file  . Frequency of Social Gatherings with Friends and Family: Not on file  . Attends Religious Services: Not on file  . Active Member of Clubs or Organizations: Not on file  . Attends Archivist Meetings: Not on file  . Marital Status: Not on file    ECOG Status: 0 - Asymptomatic  Review of Systems: A 12 point ROS discussed and pertinent positives are indicated in the HPI above.  All other systems are negative.  Review of Systems  Constitutional: Negative.   Respiratory: Negative.   Cardiovascular: Negative.   Gastrointestinal: Negative.   Genitourinary: Negative.   Musculoskeletal: Negative.   Neurological: Negative.     Vital Signs: BP (!) 219/91   Pulse 73   Resp 16   Ht _0  (1.575 m)   Wt 78 kg   SpO2 99%   BMI 31.46 kg/m    Physical Exam Vitals reviewed.  Constitutional:      Appearance: Normal appearance.  Cardiovascular:     Rate and Rhythm: Normal rate and regular rhythm.     Pulses: Normal pulses.     Heart sounds: Normal heart sounds. No murmur. No friction rub. No gallop.   Pulmonary:     Effort: Pulmonary effort is normal. No respiratory distress.     Breath sounds: Normal breath sounds. No stridor. No wheezing, rhonchi or rales.  Abdominal:     Palpations: Abdomen is soft. There is no mass.  Tenderness: There is no abdominal tenderness. There is no guarding or rebound.  Musculoskeletal:        General: No swelling.     Cervical back: Neck supple.  Skin:    General: Skin is warm and dry.  Neurological:     General: No focal deficit present.     Mental Status: She is alert and oriented to person, place, and time.     Imaging: US BREAST LTD UNI RIGHT INC AXILLA  Result Date: 06/30/2019 CLINICAL DATA:  Palpable lump in the right breast EXAM: DIGITAL DIAGNOSTIC BILATERAL MAMMOGRAM WITH CAD AND TOMO ULTRASOUND RIGHT BREAST COMPARISON:  Previous exam(s). ACR Breast Density Category b: There are scattered areas of fibroglandular density. FINDINGS: There is a spiculated mass in the region of the patient's palpable lump measuring 3.8 cm mammographically. No other suspicious findings are identified in either breast. Mammographic images were processed with CAD. On physical exam, there is a palpable lump in the right breast at 11 o'clock, 10 cm from the nipple. Targeted ultrasound is performed, showing an irregular hypoechoic mass at 11 o'clock, 10 cm from the nipple measuring 2.8 x 2.4 x 2.6 cm. There is a single mildly abnormal node in the low right axilla with a cortex measuring up to 4.4 mm. No other suspicious findings. IMPRESSION: Highly suspicious right breast mass. Mildly abnormal right axillary node. No abnormalities on the left. RECOMMENDATION: Recommend ultrasound-guided biopsy of the right  breast mass and a mildly abnormal right axillary node. I have discussed the findings and recommendations with the patient. If applicable, a reminder letter will be sent to the patient regarding the next appointment. BI-RADS CATEGORY  5: Highly suggestive of malignancy. Electronically Signed   By: Dorise Bullion III M.D   On: 06/30/2019 12:19   MM DIAG BREAST TOMO BILATERAL  Result Date: 06/30/2019 CLINICAL DATA:  Palpable lump in the right breast EXAM: DIGITAL DIAGNOSTIC BILATERAL MAMMOGRAM WITH CAD AND TOMO ULTRASOUND RIGHT BREAST COMPARISON:  Previous exam(s). ACR Breast Density Category b: There are scattered areas of fibroglandular density. FINDINGS: There is a spiculated mass in the region of the patient's palpable lump measuring 3.8 cm mammographically. No other suspicious findings are identified in either breast. Mammographic images were processed with CAD. On physical exam, there is a palpable lump in the right breast at 11 o'clock, 10 cm from the nipple. Targeted ultrasound is performed, showing an irregular hypoechoic mass at 11 o'clock, 10 cm from the nipple measuring 2.8 x 2.4 x 2.6 cm. There is a single mildly abnormal node in the low right axilla with a cortex measuring up to 4.4 mm. No other suspicious findings. IMPRESSION: Highly suspicious right breast mass. Mildly abnormal right axillary node. No abnormalities on the left. RECOMMENDATION: Recommend ultrasound-guided biopsy of the right breast mass and a mildly abnormal right axillary node. I have discussed the findings and recommendations with the patient. If applicable, a reminder letter will be sent to the patient regarding the next appointment. BI-RADS CATEGORY  5: Highly suggestive of malignancy. Electronically Signed   By: Dorise Bullion III M.D   On: 06/30/2019 12:19   MM CLIP PLACEMENT RIGHT  Result Date: 07/05/2019 CLINICAL DATA:  Ultrasound-guided core needle biopsies were performed of a suspicious palpable mass upper-outer  quadrant right breast and an axillary lymph node with focal cortical thickening. EXAM: DIAGNOSTIC RIGHT MAMMOGRAM POST ULTRASOUND BIOPSIES COMPARISON:  Previous exam(s). FINDINGS: Mammographic images were obtained following ultrasound guided biopsy of a right breast mass in the upper-outer  quadrant and ultrasound-guided biopsy of a right axillary lymph node. The Venus biopsy marking clip is in expected position at the site of the biopsied breast mass. HydroMARK biopsy clip is in the expected location of the biopsied lymph node. There are post biopsy changes surrounding the axillary biopsy clip. IMPRESSION: Appropriate positioning of the Venus and HydroMARK biopsy marking clips in the right breast and right axilla. Final Assessment: Post Procedure Mammograms for Marker Placement Electronically Signed   By: Curlene Dolphin M.D.   On: 07/05/2019 09:19   [place] RT BREAST BX W LOC DEV 1ST LESION IMG BX SPEC US GUIDE  Addendum Date: 07/06/2019   ADDENDUM REPORT: 07/06/2019 11:53 ADDENDUM: Pathology revealed GRADE III INVASIVE MAMMARY CARCINOMA, NO SPECIAL TYPE of the RIGHT breast at 10-11 o'clock, 10 cm from nipple. This was found to be concordant by Dr. Curlene Dolphin. Pathology revealed PREDOMINANTLY BLOOD AND FIBROADIPOSE TISSUE, WITH SCANT LYMPHOID TISSUE PRESENT of the RIGHT axillary lymph node. This was found to be concordant by Dr. Curlene Dolphin. Correlate with sentinel lymph node biopsy. Pathology results were discussed with the patient by telephone. The patient reported doing well after the biopsy with tenderness at the site. Post biopsy instructions and care were reviewed and questions were answered. The patient was encouraged to call [facility] for any additional concerns. Surgical and Oncologist referral will be arranged by Al Pimple RN and Tanya Nones RN of [facility]. Pathology results reported by Stacie Acres RN on 07/06/2019.  Electronically Signed   By: Curlene Dolphin M.D.   On: 07/06/2019 11:53   Result Date: 07/06/2019 CLINICAL DATA:  Ultrasound-guided core needle biopsy was recommended of a suspicious palpable mass in the upper-outer quadrant of the right breast. EXAM: ULTRASOUND GUIDED RIGHT BREAST CORE NEEDLE BIOPSY COMPARISON:  Previous exam(s). FINDINGS: I met with the patient and we discussed the procedure of ultrasound-guided biopsy, including benefits and alternatives. We discussed the high likelihood of a successful procedure. We discussed the risks of the procedure, including infection, bleeding, tissue injury, clip migration, and inadequate sampling. Informed written consent was given. The usual time-out protocol was performed immediately prior to the procedure. Lesion quadrant: Upper outer quadrant Using sterile technique and 1% Lidocaine as local anesthetic, under direct ultrasound visualization, a 12 gauge spring-loaded device was used to perform biopsy of a 2.8 cm palpable mass in the 10-11 o'clock axis of the right breast approximately 10 cm from the nipple using a lateral approach. At the conclusion of the procedure Venus tissue marker clip was deployed into the biopsy cavity. Follow up 2 view mammogram was performed and dictated separately. IMPRESSION: Ultrasound guided biopsy of the right breast. No apparent complications. Electronically Signed: By: Curlene Dolphin M.D. On: 07/05/2019 09:15   [place] RT BREAST BX W LOC DEV EA ADD LESION IMG BX SPEC US GUIDE  Addendum Date: 07/06/2019   ADDENDUM REPORT: 07/06/2019 11:56 ADDENDUM: Pathology revealed GRADE III INVASIVE MAMMARY CARCINOMA, NO SPECIAL TYPE of the RIGHT breast at 10-11 o'clock, 10 cm from nipple. This was found to be concordant by Dr. Curlene Dolphin. Pathology revealed PREDOMINANTLY BLOOD AND FIBROADIPOSE TISSUE, WITH SCANT LYMPHOID TISSUE PRESENT of the RIGHT axillary lymph node. This was found to be concordant by Dr. Curlene Dolphin. Correlate with sentinel lymph  node biopsy. Pathology results were discussed with the patient by telephone. The patient reported doing well after the biopsy with tenderness at the site. Post biopsy instructions and care were  reviewed and questions were answered. The patient was encouraged to call [facility] for any additional concerns. Surgical and Oncologist referral will be arranged by Al Pimple RN and Tanya Nones RN of [facility]. Pathology results reported by Stacie Acres RN on 07/06/2019. Electronically Signed   By: Curlene Dolphin M.D.   On: 07/06/2019 11:56   Result Date: 07/06/2019 CLINICAL DATA:  Ultrasound-guided core needle biopsy was recommended of a right axillary lymph node with cortical thickening. EXAM: ULTRASOUND GUIDED RIGHT AXILLA CORE NEEDLE BIOPSY COMPARISON:  Previous exam(s). FINDINGS: I met with the patient and we discussed the procedure of ultrasound-guided biopsy, including benefits and alternatives. We discussed the high likelihood of a successful procedure. We discussed the risks of the procedure, including infection, bleeding, tissue injury, clip migration, and inadequate sampling. Informed written consent was given. The usual time-out protocol was performed immediately prior to the procedure. Using sterile technique and 1% Lidocaine as local anesthetic, under direct ultrasound visualization, a 14 gauge spring-loaded device was used to perform biopsy of an axillary lymph node with focal cortical thickening using a lateral approach. At the conclusion of the procedure [facility] tissue marker clip was deployed into the biopsy cavity. Follow up 2 view mammogram was performed and dictated separately. IMPRESSION: Ultrasound guided biopsy of a right axillary lymph node. No apparent complications. Electronically Signed: By: Curlene Dolphin M.D. On: 07/05/2019 09:16    Labs:  CBC: Recent Labs    12/28/18 0017 07/08/19 0937 07/20/19 1352   WBC 9.4 7.6 8.0  HGB 13.4 13.2 12.3  HCT 39.7 40.2 37.2  PLT 264 241 258    COAGS: Recent Labs    07/20/19 1352  INR 1.0    BMP: Recent Labs    12/28/18 0017 07/08/19 0937  NA 135 135  K 2.8* 3.8  CL 100 99  CO2 19* 25  GLUCOSE 228* 276*  BUN 13 17  CALCIUM 9.6 9.5  CREATININE 0.75 0.85  GFRNONAA >60 >60  GFRAA >60 >60    LIVER FUNCTION TESTS: Recent Labs    12/28/18 0017 07/08/19 0937  BILITOT 0.4 0.7  AST 18 17  ALT 17 15  ALKPHOS 47 41  PROT 7.8 8.3*  ALBUMIN 4.0 4.3    TUMOR MARKERS: No results for input(s): AFPTM, CEA, CA199, CHROMGRNA in the last 8760 hours.  Assessment and Plan:  For port placement today. Will place on left via IJ access given that breast CA is in upper outer right breast.  Risks and benefits of image guided port-a-catheter placement was discussed with the patient including, but not limited to bleeding, infection, pneumothorax, or fibrin sheath development and need for additional procedures.  All of the patient's questions were answered, patient is agreeable to proceed. Consent signed and in chart.  Thank you for this interesting consult.  I greatly enjoyed meeting Dawn Alvarez and look forward to participating in their care.  A copy of this report was sent to the requesting provider on this date.  Electronically Signed: Azzie Roup, MD 07/20/2019, 2:24 PM     I spent a total of 15 Minutes in face to face in clinical consultation, greater than 50% of which was counseling/coordinating care for port placement.

## 2019-07-20 NOTE — Discharge Instructions (Signed)
Implanted Port Insertion, Care After °This sheet gives you information about how to care for yourself after your procedure. Your health care provider may also give you more specific instructions. If you have problems or questions, contact your health care provider. °What can I expect after the procedure? °After the procedure, it is common to have: °· Discomfort at the port insertion site. °· Bruising on the skin over the port. This should improve over 3-4 days. °Follow these instructions at home: °Port care °· After your port is placed, you will get a manufacturer's information card. The card has information about your port. Keep this card with you at all times. °· Take care of the port as told by your health care provider. Ask your health care provider if you or a family member can get training for taking care of the port at home. A home health care nurse may also take care of the port. °· Make sure to remember what type of port you have. °Incision care ° °  ° °· Follow instructions from your health care provider about how to take care of your port insertion site. Make sure you: °? Wash your hands with soap and water before and after you change your bandage (dressing). If soap and water are not available, use hand sanitizer. °? Change your dressing as told by your health care provider. °? Leave stitches (sutures), skin glue, or adhesive strips in place. These skin closures may need to stay in place for 2 weeks or longer. If adhesive strip edges start to loosen and curl up, you may trim the loose edges. Do not remove adhesive strips completely unless your health care provider tells you to do that. °· Check your port insertion site every day for signs of infection. Check for: °? Redness, swelling, or pain. °? Fluid or blood. °? Warmth. °? Pus or a bad smell. °Activity °· Return to your normal activities as told by your health care provider. Ask your health care provider what activities are safe for you. °· Do not  lift anything that is heavier than 10 lb (4.5 kg), or the limit that you are told, until your health care provider says that it is safe. °General instructions °· Take over-the-counter and prescription medicines only as told by your health care provider. °· Do not take baths, swim, or use a hot tub until your health care provider approves. Ask your health care provider if you may take showers. You may only be allowed to take sponge baths. °· Do not drive for 24 hours if you were given a sedative during your procedure. °· Wear a medical alert bracelet in case of an emergency. This will tell any health care providers that you have a port. °· Keep all follow-up visits as told by your health care provider. This is important. °Contact a health care provider if: °· You cannot flush your port with saline as directed, or you cannot draw blood from the port. °· You have a fever or chills. °· You have redness, swelling, or pain around your port insertion site. °· You have fluid or blood coming from your port insertion site. °· Your port insertion site feels warm to the touch. °· You have pus or a bad smell coming from the port insertion site. °Get help right away if: °· You have chest pain or shortness of breath. °· You have bleeding from your port that you cannot control. °Summary °· Take care of the port as told by your health   care provider. Keep the manufacturer's information card with you at all times. °· Change your dressing as told by your health care provider. °· Contact a health care provider if you have a fever or chills or if you have redness, swelling, or pain around your port insertion site. °· Keep all follow-up visits as told by your health care provider. °This information is not intended to replace advice given to you by your health care provider. Make sure you discuss any questions you have with your health care provider. °Document Revised: 12/15/2017 Document Reviewed: 12/15/2017 °Elsevier Patient Education ©  2020 [employer] ° °

## 2019-07-21 ENCOUNTER — Ambulatory Visit: Payer: Medicare HMO

## 2019-07-21 ENCOUNTER — Inpatient Hospital Stay: Payer: Medicare HMO

## 2019-07-21 ENCOUNTER — Encounter: Payer: Self-pay | Admitting: *Deleted

## 2019-07-21 ENCOUNTER — Inpatient Hospital Stay: Payer: Medicare HMO | Admitting: Oncology

## 2019-07-22 NOTE — Telephone Encounter (Signed)
See notes I Epic

## 2019-07-24 DIAGNOSIS — Z794 Long term (current) use of insulin: Secondary | ICD-10-CM | POA: Diagnosis not present

## 2019-07-24 DIAGNOSIS — E119 Type 2 diabetes mellitus without complications: Secondary | ICD-10-CM | POA: Diagnosis not present

## 2019-07-24 DIAGNOSIS — I10 Essential (primary) hypertension: Secondary | ICD-10-CM | POA: Diagnosis not present

## 2019-07-24 DIAGNOSIS — E669 Obesity, unspecified: Secondary | ICD-10-CM | POA: Diagnosis not present

## 2019-07-24 DIAGNOSIS — Z6831 Body mass index (BMI) 31.0-31.9, adult: Secondary | ICD-10-CM | POA: Diagnosis not present

## 2019-07-25 ENCOUNTER — Other Ambulatory Visit: Payer: Self-pay

## 2019-07-25 ENCOUNTER — Ambulatory Visit
Admission: RE | Admit: 2019-07-25 | Discharge: 2019-07-25 | Disposition: A | Discharge status: Home / Self Care | Payer: Medicare HMO | Source: Ambulatory Visit | Attending: Oncology | Admitting: Oncology

## 2019-07-25 ENCOUNTER — Inpatient Hospital Stay: Payer: Medicare HMO

## 2019-07-25 ENCOUNTER — Inpatient Hospital Stay (HOSPITAL_BASED_OUTPATIENT_CLINIC_OR_DEPARTMENT_OTHER): Payer: Medicare HMO | Admitting: Nurse Practitioner

## 2019-07-25 DIAGNOSIS — C50411 Malignant neoplasm of upper-outer quadrant of right female breast: Secondary | ICD-10-CM

## 2019-07-25 DIAGNOSIS — Z171 Estrogen receptor negative status [ER-]: Secondary | ICD-10-CM

## 2019-07-25 DIAGNOSIS — C50919 Malignant neoplasm of unspecified site of unspecified female breast: Secondary | ICD-10-CM | POA: Diagnosis not present

## 2019-07-25 DIAGNOSIS — Z01810 Encounter for preprocedural cardiovascular examination: Secondary | ICD-10-CM | POA: Diagnosis not present

## 2019-07-25 MED ORDER — TECHNETIUM TC 99M-LABELED RED BLOOD CELLS IV KIT
25.0000 | PACK | Freq: Once | INTRAVENOUS | Status: AC | PRN
  Administered 2019-07-25: 22.08 via INTRAVENOUS

## 2019-07-25 NOTE — Progress Notes (Signed)
Virtual Visit Progress Note  [place] [facility] [place]  Telephone:([phone] [phone]  Patient Care Team: Lavera Guise, MD as PCP - General (Internal Medicine) Rico Junker, RN as Registered Nurse Theodore Demark, RN as Registered Nurse   Name of the patient: Dawn Alvarez  [id number]  May 13, 1956   Date of visit: 07/25/19  I connected with Dawn Alvarez on 07/25/19 at  9:00 AM EST by telephone visit and verified that I am speaking with the correct person using two identifiers.   I discussed the limitations, risks, security and privacy concerns of performing an evaluation and management service by telemedicine and the availability of in-person appointments. I also discussed with the patient that there may be a patient responsible charge related to this service. The patient expressed understanding and agreed to proceed.   Other persons participating in the visit and their role in the encounter: Magdalene Patricia, Therapist, sports (Nurse Navigator & Chemo Education)  Patient's location: home Provider's location: clinic  Diagnosis- Breast Cancer  Chief complaint/Reason for visit- Initial Meeting for [facility], preparing for starting chemotherapy  Heme/Onc history:  Oncology History  Malignant neoplasm of upper-outer quadrant of right female breast ([place])  07/08/2019 Initial Diagnosis   Malignant neoplasm of upper-outer quadrant of right female breast ([place])   07/27/2019 -  Chemotherapy   The patient had DOXOrubicin (ADRIAMYCIN) chemo injection 114 mg, 60 mg/m2 = 114 mg, Intravenous,  Once, 0 of 4 cycles palonosetron (ALOXI) injection 0.25 mg, 0.25 mg, Intravenous,  Once, 0 of 8 cycles pegfilgrastim (NEULASTA ONPRO KIT) injection 6 mg, 6 mg, Subcutaneous, Once, 0 of 4 cycles CARBOplatin (PARAPLATIN) in sodium chloride 0.9 % 100 mL chemo infusion, , Intravenous,  Once, 0 of 4 cycles cyclophosphamide (CYTOXAN) 1,140 mg in sodium chloride  0.9 % 250 mL chemo infusion, 600 mg/m2 = 1,140 mg, Intravenous,  Once, 0 of 4 cycles PACLitaxel (TAXOL) 150 mg in sodium chloride 0.9 % 250 mL chemo infusion (</= 64m/m2), 80 mg/m2, Intravenous,  Once, 0 of 4 cycles fosaprepitant (EMEND) 150 mg in sodium chloride 0.9 % 145 mL IVPB, 150 mg, Intravenous,  Once, 0 of 8 cycles  for chemotherapy treatment.      Interval history-  Dawn Alvarez 64year old female, newly diagnosed with breast cancer, who presents to chemo care clinic today for initial meeting in preparation for starting chemotherapy. I introduced the chemo care clinic and we discussed that the role of the clinic is to assist those who are at an increased risk of emergency room visits and/or complications during the course of chemotherapy treatment. We discussed that the increased risk takes into account factors such as age, performance status, and co-morbidities. We also discussed that for some, this might include barriers to care such as not having a primary care provider, lack of insurance/transportation, or not being able to afford medications. We discussed that the goal of the program is to help prevent unplanned ER visits and help reduce complications during chemotherapy. We do this by discussing specific risk factors to each individual and identifying ways that we can help improve these risk factors and reduce barriers to care.  We discussed that she was identified as moderate risk based on prior ER visits, Medicare and Medicaid status, history of asthma, CVD, COPD and diabetes mellitus.  ECOG FS:1 - Symptomatic but completely ambulatory  Review of systems- Review of Systems  Constitutional: Negative for chills, fever, malaise/fatigue and weight loss.  HENT: Negative for hearing loss, nosebleeds, sore throat and tinnitus.   Eyes: Negative for blurred vision and double vision.  Respiratory: Negative for cough, hemoptysis, shortness of breath and wheezing.   Cardiovascular: Negative  for chest pain, palpitations and leg swelling.  Gastrointestinal: Negative for abdominal pain, blood in stool, constipation, diarrhea, melena, nausea and vomiting.  Genitourinary: Negative for dysuria and urgency.  Musculoskeletal: Negative for back pain, falls, joint pain and myalgias.  Skin: Negative for itching and rash.  Neurological: Negative for dizziness, tingling, sensory change, loss of consciousness, weakness and headaches.  Endo/Heme/Allergies: Negative for environmental allergies. Does not bruise/bleed easily.  Psychiatric/Behavioral: Positive for depression. The patient is nervous/anxious. The patient does not have insomnia.        'feels overwhelmed'    Current treatment- adriamycin-cytoxan-taxol-carbo for new breast cancer  No Known Allergies  Past Medical History:  Diagnosis Date  . Asthma   . COPD exacerbation ([place]) 04/12/2016  . Diabetes mellitus without complication ([place])   . Hyperlipemia   . Hypertension     Past Surgical History:  Procedure Laterality Date  . BREAST BIOPSY Right 07/05/2019   [place] bx venus marker, path pending  . BREAST BIOPSY Right 07/05/2019   LN bx, hydromarker, path pending  . CESAREAN SECTION    . COLONOSCOPY WITH PROPOFOL N/A 09/11/2015   Procedure: COLONOSCOPY WITH PROPOFOL;  Surgeon: Lucilla Lame, MD;  Location: [facility] ENDOSCOPY;  Service: Endoscopy;  Laterality: N/A;  . IR IMAGING GUIDED PORT INSERTION  07/20/2019    Social History   Socioeconomic History  . Marital status: Married    Spouse name: Not on file  . Number of children: Not on file  . Years of education: Not on file  . Highest education level: Not on file  Occupational History  . Not on file  Tobacco Use  . Smoking status: Former Smoker    Packs/day: 1.00    Years: 2.00    Pack years: 2.00    Types: Cigarettes    Quit date: 05/19/2019    Years since quitting: 0.1  . Smokeless tobacco: Never Used  Substance and Sexual Activity  . Alcohol use: Yes     Alcohol/week: 2.0 standard drinks    Types: 2 Cans of beer per week    Comment: weekends   . Drug use: No  . Sexual activity: Never    Birth control/protection: Abstinence  Other Topics Concern  . Not on file  Social History Narrative  . Not on file   Social Determinants of Health   Financial Resource Strain:   . Difficulty of Paying Living Expenses: Not on file  Food Insecurity:   . Worried About Charity fundraiser in the Last Year: Not on file  . Ran Out of Food in the Last Year: Not on file  Transportation Needs:   . Lack of Transportation (Medical): Not on file  . Lack of Transportation (Non-Medical): Not on file  Physical Activity:   . Days of Exercise per Week: Not on file  . Minutes of Exercise per Session: Not on file  Stress:   . Feeling of Stress : Not on file  Social Connections:   . Frequency of Communication with Friends and Family: Not on file  . Frequency of Social Gatherings with Friends and Family: Not on file  . Attends Religious Services: Not on file  . Active Member of Clubs or Organizations: Not on file  . Attends Archivist Meetings: Not on file  .  Marital Status: Not on file  Intimate Partner Violence:   . Fear of Current or Ex-Partner: Not on file  . Emotionally Abused: Not on file  . Physically Abused: Not on file  . Sexually Abused: Not on file    Family History  Problem Relation Age of Onset  . Diabetes Mother   . Hypertension Mother   . Hypertension Father   . Diabetes Father      Current Outpatient Medications:  .  amLODipine (NORVASC) 10 MG tablet, Take 1 tablet (10 mg total) by mouth daily., Disp: 30 tablet, Rfl: 5 .  bisoprolol-hydrochlorothiazide (ZIAC) 10-6.25 MG tablet, Take 1 tablet by mouth daily., Disp: 30 tablet, Rfl: 2 .  dexamethasone (DECADRON) 4 MG tablet, Take 2 tablets by mouth daily starting the day after Carboplatin and Cytoxan x 2 days. Take with food., Disp: 30 tablet, Rfl: 1 .  glucose blood (ACCU-CHEK  SMARTVIEW) test strip, 1 each by Other route as needed for other. Use as directed twice a day diag E11.65, Disp: , Rfl:  .  hydrALAZINE (APRESOLINE) 50 MG tablet, Take 1 tablet (50 mg total) by mouth 3 (three) times daily., Disp: 90 tablet, Rfl: 3 .  insulin detemir (LEVEMIR) 100 UNIT/ML injection, Inject 0.4 mLs (40 Units total) into the skin daily., Disp: 10 mL, Rfl: 5 .  Insulin Pen Needle (PEN NEEDLES) 31G X 8 MM MISC, Use as directed with insulin E11.65, Disp: 100 each, Rfl: 1 .  Lancets Misc. (ACCU-CHEK MULTICLIX LANCET DEV) KIT, by Does not apply route. Use as directed twice a day diag E11.65, Disp: , Rfl:  .  lidocaine-prilocaine (EMLA) cream, Apply to affected area once, Disp: 30 g, Rfl: 3 .  lisinopril (ZESTRIL) 20 MG tablet, Take 1 tablet (20 mg total) by mouth daily., Disp: 30 tablet, Rfl: 3 .  Omega-3 Fatty Acids (FISH OIL) 1000 MG CAPS, Take 1,000 mg by mouth daily. , Disp: , Rfl:  .  prochlorperazine (COMPAZINE) 10 MG tablet, Take 1 tablet (10 mg total) by mouth every 6 (six) hours as needed (Nausea or vomiting)., Disp: 30 tablet, Rfl: 1 .  Semaglutide, 1 MG/DOSE, (OZEMPIC, 1 MG/DOSE,) 2 MG/1.5ML SOPN, Inject 1 mg into the skin once a week., Disp: 5 pen, Rfl: 3  Physical exam: There were no vitals filed for this visit. Exam limited due to telemedicine  CMP Latest Ref Rng & Units 07/08/2019  Glucose 70 - 99 mg/dL 276(H)  BUN 8 - 23 mg/dL 17  Creatinine 0.44 - 1.00 mg/dL 0.85  Sodium 135 - 145 mmol/L 135  Potassium 3.5 - 5.1 mmol/L 3.8  Chloride 98 - 111 mmol/L 99  CO2 22 - 32 mmol/L 25  Calcium 8.9 - 10.3 mg/dL 9.5  Total Protein 6.5 - 8.1 g/dL 8.3(H)  Total Bilirubin 0.3 - 1.2 mg/dL 0.7  Alkaline Phos 38 - 126 U/L 41  AST 15 - 41 U/L 17  ALT 0 - 44 U/L 15   CBC Latest Ref Rng & Units 07/20/2019  WBC 4.0 - 10.5 K/uL 8.0  Hemoglobin 12.0 - 15.0 g/dL 12.3  Hematocrit 36.0 - 46.0 % 37.2  Platelets 150 - 400 K/uL 258    No images are attached to the encounter.  US BREAST  LTD UNI RIGHT INC AXILLA  Result Date: 06/30/2019 CLINICAL DATA:  Palpable lump in the right breast EXAM: DIGITAL DIAGNOSTIC BILATERAL MAMMOGRAM WITH CAD AND TOMO ULTRASOUND RIGHT BREAST COMPARISON:  Previous exam(s). ACR Breast Density Category b: There are scattered areas of  fibroglandular density. FINDINGS: There is a spiculated mass in the region of the patient's palpable lump measuring 3.8 cm mammographically. No other suspicious findings are identified in either breast. Mammographic images were processed with CAD. On physical exam, there is a palpable lump in the right breast at 11 o'clock, 10 cm from the nipple. Targeted ultrasound is performed, showing an irregular hypoechoic mass at 11 o'clock, 10 cm from the nipple measuring 2.8 x 2.4 x 2.6 cm. There is a single mildly abnormal node in the low right axilla with a cortex measuring up to 4.4 mm. No other suspicious findings. IMPRESSION: Highly suspicious right breast mass. Mildly abnormal right axillary node. No abnormalities on the left. RECOMMENDATION: Recommend ultrasound-guided biopsy of the right breast mass and a mildly abnormal right axillary node. I have discussed the findings and recommendations with the patient. If applicable, a reminder letter will be sent to the patient regarding the next appointment. BI-RADS CATEGORY  5: Highly suggestive of malignancy. Electronically Signed   By: Dorise Bullion III M.D   On: 06/30/2019 12:19   MM DIAG BREAST TOMO BILATERAL  Result Date: 06/30/2019 CLINICAL DATA:  Palpable lump in the right breast EXAM: DIGITAL DIAGNOSTIC BILATERAL MAMMOGRAM WITH CAD AND TOMO ULTRASOUND RIGHT BREAST COMPARISON:  Previous exam(s). ACR Breast Density Category b: There are scattered areas of fibroglandular density. FINDINGS: There is a spiculated mass in the region of the patient's palpable lump measuring 3.8 cm mammographically. No other suspicious findings are identified in either breast. Mammographic images were processed  with CAD. On physical exam, there is a palpable lump in the right breast at 11 o'clock, 10 cm from the nipple. Targeted ultrasound is performed, showing an irregular hypoechoic mass at 11 o'clock, 10 cm from the nipple measuring 2.8 x 2.4 x 2.6 cm. There is a single mildly abnormal node in the low right axilla with a cortex measuring up to 4.4 mm. No other suspicious findings. IMPRESSION: Highly suspicious right breast mass. Mildly abnormal right axillary node. No abnormalities on the left. RECOMMENDATION: Recommend ultrasound-guided biopsy of the right breast mass and a mildly abnormal right axillary node. I have discussed the findings and recommendations with the patient. If applicable, a reminder letter will be sent to the patient regarding the next appointment. BI-RADS CATEGORY  5: Highly suggestive of malignancy. Electronically Signed   By: Dorise Bullion III M.D   On: 06/30/2019 12:19   MM CLIP PLACEMENT RIGHT  Result Date: 07/05/2019 CLINICAL DATA:  Ultrasound-guided core needle biopsies were performed of a suspicious palpable mass upper-outer quadrant right breast and an axillary lymph node with focal cortical thickening. EXAM: DIAGNOSTIC RIGHT MAMMOGRAM POST ULTRASOUND BIOPSIES COMPARISON:  Previous exam(s). FINDINGS: Mammographic images were obtained following ultrasound guided biopsy of a right breast mass in the upper-outer quadrant and ultrasound-guided biopsy of a right axillary lymph node. The Venus biopsy marking clip is in expected position at the site of the biopsied breast mass. HydroMARK biopsy clip is in the expected location of the biopsied lymph node. There are post biopsy changes surrounding the axillary biopsy clip. IMPRESSION: Appropriate positioning of the Venus and HydroMARK biopsy marking clips in the right breast and right axilla. Final Assessment: Post Procedure Mammograms for Marker Placement Electronically Signed   By: Curlene Dolphin M.D.   On: 07/05/2019 09:19   [place] RT BREAST BX  W LOC DEV 1ST LESION IMG BX SPEC US GUIDE  Addendum Date: 07/06/2019   ADDENDUM REPORT: 07/06/2019 11:53 ADDENDUM: Pathology revealed GRADE III  INVASIVE MAMMARY CARCINOMA, NO SPECIAL TYPE of the RIGHT breast at 10-11 o'clock, 10 cm from nipple. This was found to be concordant by Dr. Curlene Dolphin. Pathology revealed PREDOMINANTLY BLOOD AND FIBROADIPOSE TISSUE, WITH SCANT LYMPHOID TISSUE PRESENT of the RIGHT axillary lymph node. This was found to be concordant by Dr. Curlene Dolphin. Correlate with sentinel lymph node biopsy. Pathology results were discussed with the patient by telephone. The patient reported doing well after the biopsy with tenderness at the site. Post biopsy instructions and care were reviewed and questions were answered. The patient was encouraged to call [facility] [place] [facility] for any additional concerns. Surgical and Oncologist referral will be arranged by Al Pimple RN and Tanya Nones RN of [place]. Pathology results reported by Stacie Acres RN on 07/06/2019. Electronically Signed   By: Curlene Dolphin M.D.   On: 07/06/2019 11:53   Result Date: 07/06/2019 CLINICAL DATA:  Ultrasound-guided core needle biopsy was recommended of a suspicious palpable mass in the upper-outer quadrant of the right breast. EXAM: ULTRASOUND GUIDED RIGHT BREAST CORE NEEDLE BIOPSY COMPARISON:  Previous exam(s). FINDINGS: I met with the patient and we discussed the procedure of ultrasound-guided biopsy, including benefits and alternatives. We discussed the high likelihood of a successful procedure. We discussed the risks of the procedure, including infection, bleeding, tissue injury, clip migration, and inadequate sampling. Informed written consent was given. The usual time-out protocol was performed immediately prior to the procedure. Lesion quadrant: Upper outer quadrant Using sterile technique and 1% Lidocaine as local anesthetic, under direct  ultrasound visualization, a 12 gauge spring-loaded device was used to perform biopsy of a 2.8 cm palpable mass in the 10-11 o'clock axis of the right breast approximately 10 cm from the nipple using a lateral approach. At the conclusion of the procedure Venus tissue marker clip was deployed into the biopsy cavity. Follow up 2 view mammogram was performed and dictated separately. IMPRESSION: Ultrasound guided biopsy of the right breast. No apparent complications. Electronically Signed: By: Curlene Dolphin M.D. On: 07/05/2019 09:15   [place] RT BREAST BX W LOC DEV EA ADD LESION IMG BX SPEC US GUIDE  Addendum Date: 07/06/2019   ADDENDUM REPORT: 07/06/2019 11:56 ADDENDUM: Pathology revealed GRADE III INVASIVE MAMMARY CARCINOMA, NO SPECIAL TYPE of the RIGHT breast at 10-11 o'clock, 10 cm from nipple. This was found to be concordant by Dr. Curlene Dolphin. Pathology revealed PREDOMINANTLY BLOOD AND FIBROADIPOSE TISSUE, WITH SCANT LYMPHOID TISSUE PRESENT of the RIGHT axillary lymph node. This was found to be concordant by Dr. Curlene Dolphin. Correlate with sentinel lymph node biopsy. Pathology results were discussed with the patient by telephone. The patient reported doing well after the biopsy with tenderness at the site. Post biopsy instructions and care were reviewed and questions were answered. The patient was encouraged to call [facility] [place] [facility] for any additional concerns. Surgical and Oncologist referral will be arranged by Al Pimple RN and Tanya Nones RN of [place]. Pathology results reported by Stacie Acres RN on 07/06/2019. Electronically Signed   By: Curlene Dolphin M.D.   On: 07/06/2019 11:56   Result Date: 07/06/2019 CLINICAL DATA:  Ultrasound-guided core needle biopsy was recommended of a right axillary lymph node with cortical thickening. EXAM: ULTRASOUND GUIDED RIGHT AXILLA CORE NEEDLE BIOPSY COMPARISON:  Previous exam(s). FINDINGS: I met  with the patient and we discussed the procedure of ultrasound-guided biopsy, including benefits and alternatives. We  discussed the high likelihood of a successful procedure. We discussed the risks of the procedure, including infection, bleeding, tissue injury, clip migration, and inadequate sampling. Informed written consent was given. The usual time-out protocol was performed immediately prior to the procedure. Using sterile technique and 1% Lidocaine as local anesthetic, under direct ultrasound visualization, a 14 gauge spring-loaded device was used to perform biopsy of an axillary lymph node with focal cortical thickening using a lateral approach. At the conclusion of the procedure [facility] tissue marker clip was deployed into the biopsy cavity. Follow up 2 view mammogram was performed and dictated separately. IMPRESSION: Ultrasound guided biopsy of a right axillary lymph node. No apparent complications. Electronically Signed: By: Curlene Dolphin M.D. On: 07/05/2019 09:16   IR IMAGING GUIDED PORT INSERTION  Result Date: 07/20/2019 CLINICAL DATA:  Right breast carcinoma and need for porta cath for chemotherapy. EXAM: IMPLANTED PORT A CATH PLACEMENT WITH ULTRASOUND AND FLUOROSCOPIC GUIDANCE ANESTHESIA/SEDATION: 1.0 mg IV Versed; 50 mcg IV Fentanyl Total Moderate Sedation Time:  28 minutes The patient's level of consciousness and physiologic status were continuously monitored during the procedure by Radiology nursing. Additional Medications: 2 g IV Ancef. FLUOROSCOPY TIME:  30 seconds. PROCEDURE: The procedure, risks, benefits, and alternatives were explained to the patient. Questions regarding the procedure were encouraged and answered. The patient understands and consents to the procedure. A time-out was performed prior to initiating the procedure. Ultrasound was utilized to confirm patency of the left internal jugular vein. The left neck and chest were prepped with chlorhexidine in a sterile fashion, and a  sterile drape was applied covering the operative field. Maximum barrier sterile technique with sterile gowns and gloves were used for the procedure. Local anesthesia was provided with 1% lidocaine. After creating a small venotomy incision, a 21 gauge needle was advanced into the left internal jugular vein under direct, real-time ultrasound guidance. Ultrasound image documentation was performed. After securing guidewire access, an 8 Fr dilator was placed. A J-wire was kinked to measure appropriate catheter length. A subcutaneous port pocket was then created along the upper chest wall utilizing sharp and blunt dissection. Portable cautery was utilized. The pocket was irrigated with sterile saline. A single lumen power injectable port was chosen for placement. The 8 Fr catheter was tunneled from the port pocket site to the venotomy incision. The port was placed in the pocket. External catheter was trimmed to appropriate length based on guidewire measurement. At the venotomy, an 8 Fr peel-away sheath was placed over a guidewire. The catheter was then placed through the sheath and the sheath removed. Final catheter positioning was confirmed and documented with a fluoroscopic spot image. The port was accessed with a needle and aspirated and flushed with heparinized saline. The access needle was removed. The venotomy and port pocket incisions were closed with subcutaneous 3-0 Monocryl and subcuticular 4-0 Vicryl. Dermabond was applied to both incisions. COMPLICATIONS: COMPLICATIONS None FINDINGS: After catheter placement, the tip lies at the cavo-atrial junction. The catheter aspirates normally and is ready for immediate use. IMPRESSION: Placement of single lumen port a cath via left internal jugular vein. The catheter tip lies at the cavo-atrial junction. A power injectable port a cath was placed and is ready for immediate use. Electronically Signed   By: Aletta Edouard M.D.   On: 07/20/2019 16:28     Assessment and  plan- Patient is a 64 y.o. female who presents to [facility] for initial meeting in preparation for starting chemotherapy for the treatment of  breast cancer   1. 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 ER negative, PR weakly positive, HER2 negative. Again reviewed plan for adriamycin-cytoxan chemotherapy along with side effects, risks vs benefits and rationale.   2. Port placement- she has had port placed for administration of chemotherapy. Port placed on 07/20/19 by Dr. Kathlene Cote. We briefly reviewed EMLA cream use for appointments where she may have labs scheduled or be receiving infusion. Also reviewed that port should be accessed every 8-12 weeks to be flushed and that ports are often removed approximately 1 year following completion of treatment.   3. Chemo Care Clinic/High Risk for ER/Hospitalization during chemotherapy- We discussed the role of the chemo care clinic and identified patient specific risk factors. I discussed that patient was identified as high risk primarily based on: prior ER utilization, Medicare and Medicaid status, history of asthma, cvd, copd, and diabetes mellitus. She says that these are all managed by Dr. Humphrey Rolls, who is her PCP and reports that she overall feels well.   3. Social Determinants of Health- we discussed that social determinants of health may have significant impacts on health and outcomes for cancer patients.  Today we discussed specific social determinants of performance status, alcohol use, depression, financial needs, food insecurity, housing, interpersonal violence, social connections, stress, tobacco use, and transportation.  After lengthy discussion o the programs and services available through [facility] she declines any specific needs. I encouraged her to notify her primary nursing team if she becomes interested in programs int he future. I encouraged her to consider support groups and counseling services  based on her reports of anxiety.   4. Hypertension- Blood pressure has been quite elevated in clinic recently. She remains asymptomatic. She has not yet gotten a blood pressure cuff to monitor pressures at home. I encouraged her to do so and also offered that she could check pressures at local drug stores or [employer] or come by clinic and we could check it for her. I encouraged compliance with her prescribed medications and encouraged her to follow up with her PCP.   5. Palliative Care- based on stage of cancer and/or identified needs today, I will refer patient to palliative care for goals of care and advanced care planning. May benefit from anxiety medicaton.   We also discussed the role of the Symptom Management Clinic at [facility] for acute issues and methods of contacting clinic/provider. She denies needing specific assistance at this time and She will be followed by [facility] RNs (Nurse Navigators).   Return to clinic  As scheduled for follow up and new start with Dr. Tasia Catchings  Visit Diagnosis 1. Malignant neoplasm of upper-outer quadrant of right breast in female, estrogen receptor negative ([place])      I discussed the assessment and treatment plan with the patient. The patient was provided an opportunity to ask questions and all were answered. The patient agreed with the plan and demonstrated an understanding of the instructions.   The patient was advised to call back or seek an in-person evaluation if the symptoms worsen or if the condition fails to improve as anticipated.   I provided 17 minutes of non face-to-face telephone visit time during this encounter, and > 50% was spent counseling as documented under my assessment & plan.  Beckey Rutter, DNP, AGNP-C [place] [facility] [phone] (clinic)

## 2019-07-26 ENCOUNTER — Ambulatory Visit: Payer: Medicare HMO

## 2019-07-27 ENCOUNTER — Encounter: Payer: Self-pay | Admitting: Adult Health

## 2019-07-27 ENCOUNTER — Inpatient Hospital Stay: Payer: Medicare HMO

## 2019-07-27 ENCOUNTER — Ambulatory Visit (INDEPENDENT_AMBULATORY_CARE_PROVIDER_SITE_OTHER): Payer: Medicare HMO | Admitting: Adult Health

## 2019-07-27 ENCOUNTER — Inpatient Hospital Stay (HOSPITAL_BASED_OUTPATIENT_CLINIC_OR_DEPARTMENT_OTHER): Payer: Medicare HMO | Admitting: Oncology

## 2019-07-27 ENCOUNTER — Encounter: Payer: Self-pay | Admitting: Oncology

## 2019-07-27 ENCOUNTER — Other Ambulatory Visit: Payer: Self-pay

## 2019-07-27 ENCOUNTER — Inpatient Hospital Stay: Payer: Medicare HMO | Attending: Oncology

## 2019-07-27 VITALS — BP 205/97 | HR 70 | Temp 97.5°F | Resp 18 | Wt 181.7 lb

## 2019-07-27 VITALS — BP 180/96 | HR 74 | Temp 97.5°F | Resp 16 | Ht 63.0 in | Wt 182.0 lb

## 2019-07-27 DIAGNOSIS — C50411 Malignant neoplasm of upper-outer quadrant of right female breast: Secondary | ICD-10-CM | POA: Insufficient documentation

## 2019-07-27 DIAGNOSIS — Z171 Estrogen receptor negative status [ER-]: Secondary | ICD-10-CM | POA: Diagnosis not present

## 2019-07-27 DIAGNOSIS — I10 Essential (primary) hypertension: Secondary | ICD-10-CM

## 2019-07-27 DIAGNOSIS — Z91199 Patient's noncompliance with other medical treatment and regimen due to unspecified reason: Secondary | ICD-10-CM

## 2019-07-27 DIAGNOSIS — Z9119 Patient's noncompliance with other medical treatment and regimen: Secondary | ICD-10-CM

## 2019-07-27 DIAGNOSIS — E1165 Type 2 diabetes mellitus with hyperglycemia: Secondary | ICD-10-CM

## 2019-07-27 DIAGNOSIS — Z7189 Other specified counseling: Secondary | ICD-10-CM

## 2019-07-27 DIAGNOSIS — Z79899 Other long term (current) drug therapy: Secondary | ICD-10-CM | POA: Diagnosis not present

## 2019-07-27 DIAGNOSIS — Z87891 Personal history of nicotine dependence: Secondary | ICD-10-CM | POA: Diagnosis not present

## 2019-07-27 LAB — COMPREHENSIVE METABOLIC PANEL
ALT: 19 U/L (ref 0–44)
AST: 28 U/L (ref 15–41)
Albumin: 3.9 g/dL (ref 3.5–5.0)
Alkaline Phosphatase: 38 U/L (ref 38–126)
Anion gap: 11 (ref 5–15)
BUN: 13 mg/dL (ref 8–23)
CO2: 23 mmol/L (ref 22–32)
Calcium: 9.2 mg/dL (ref 8.9–10.3)
Chloride: 103 mmol/L (ref 98–111)
Creatinine, Ser: 0.8 mg/dL (ref 0.44–1.00)
GFR calc Af Amer: >60 mL/min (ref >60)
GFR calc non Af Amer: >60 mL/min (ref >60)
Glucose, Bld: 180 mg/dL — ABNORMAL HIGH (ref 70–99)
Potassium: 3.6 mmol/L (ref 3.5–5.1)
Sodium: 137 mmol/L (ref 135–145)
Total Bilirubin: 0.5 mg/dL (ref 0.3–1.2)
Total Protein: 7.7 g/dL (ref 6.5–8.1)

## 2019-07-27 LAB — CBC WITH DIFFERENTIAL/PLATELET
Abs Immature Granulocytes: 0.02 10*3/uL (ref 0.00–0.07)
Basophils Absolute: 0.1 10*3/uL (ref 0.0–0.1)
Basophils Relative: 1 %
Eosinophils Absolute: 0.3 10*3/uL (ref 0.0–0.5)
Eosinophils Relative: 3 %
HCT: 38.4 % (ref 36.0–46.0)
Hemoglobin: 12.5 g/dL (ref 12.0–15.0)
Immature Granulocytes: 0 %
Lymphocytes Relative: 34 %
Lymphs Abs: 2.8 10*3/uL (ref 0.7–4.0)
MCH: 27.7 pg (ref 26.0–34.0)
MCHC: 32.6 g/dL (ref 30.0–36.0)
MCV: 85 fL (ref 80.0–100.0)
Monocytes Absolute: 0.6 10*3/uL (ref 0.1–1.0)
Monocytes Relative: 8 %
Neutro Abs: 4.3 10*3/uL (ref 1.7–7.7)
Neutrophils Relative %: 54 %
Platelets: 240 10*3/uL (ref 150–400)
RBC: 4.52 MIL/uL (ref 3.87–5.11)
RDW: 13.3 % (ref 11.5–15.5)
WBC: 8.1 10*3/uL (ref 4.0–10.5)
nRBC: 0 % (ref 0.0–0.2)

## 2019-07-27 MED ORDER — HEPARIN SOD (PORK) LOCK FLUSH 100 UNIT/ML IV SOLN
500.0000 [IU] | Freq: Once | INTRAVENOUS | Status: AC
  Administered 2019-07-27: 10:00:00 500 [IU] via INTRAVENOUS
  Filled 2019-07-27: qty 5

## 2019-07-27 MED ORDER — SODIUM CHLORIDE 0.9% FLUSH
10.0000 mL | INTRAVENOUS | Status: DC | PRN
  Administered 2019-07-27: 10 mL via INTRAVENOUS
  Filled 2019-07-27: qty 10

## 2019-07-27 NOTE — Progress Notes (Signed)
Hematology/Oncology follow up note [facility] Telephone:(336) [phone] [phone]   Patient Care Team: Lavera Guise, MD as PCP - General (Internal Medicine) Rico Junker, RN as Registered Nurse Theodore Demark, RN as Registered Nurse  REFERRING PROVIDER: Lavera Guise, MD  CHIEF COMPLAINTS/REASON FOR VISIT:  Evaluation of breast cancer  HISTORY OF PRESENTING ILLNESS:   Dawn Alvarez is a  64 y.o.  female with PMH listed below was seen in consultation at the request of  Lavera Guise, MD  for evaluation of breast cancer Patient had screening mammogram done 08/12/2017 which showed right breast asymmetry.  A diagnostic mammogram was suggested and patient did not have it done. Patient felt her right breast mass for a few weeks.  She had bilateral diagnostic mammogram done on 06/30/2019. 2.8 x 2.4 x 2.6 cm right breast mass, 11:00, 10 cm from the nipple.  There is a single mildly abnormal node in the right axilla with a cortex measuring up to 4.4 mm.  No other suspicious findings. Patient underwent ultrasound-guided core biopsy of the right breast mass and right axilla lymph node Pathology showed invasive mammary carcinoma, no special type, grade 3, ER/PR HER-2 status are pending. Right axillary lymph node biopsy showed predominantly blood in the fibroadipose tissue, with scant lymphoid tissue present.  No definite malignancy was identified.  Patient was referred to cancer center to establish care and discuss treatment plan. Menarche 58 Postmenopausal.  LMP when she was 64 years old. She recalls use of birth control pills. Denies any hormone .  Replacement therapy. Denies any prior chest radiation. She reports family history of maternal grandmother and 2 maternal cousins were diagnosed with cancer.  She does not know about details.  INTERVAL HISTORY Dawn Alvarez is a 64 y.o. female who has above history reviewed by me today presents for follow up  visit for management of breast cancer, evaluation prior to chemotherapy. Problems and complaints are listed below: Patient's blood pressure is extremely high in the clinic.  205/97.  Patient admits that she forgets to take her blood pressure medication in the morning. The last visit on 07/08/2019, she also admitted not taking her blood pressure medication that day and her blood pressure was running 216/112. She is on amlodipine, hydralazine and lisinopril for blood pressure control. Patient denies any headache, shortness of breath, chest pain, focal deficit. During the interval, she has had Mediport placed.  Review of Systems  Constitutional: Negative for appetite change, chills, fatigue and fever.  HENT:   Negative for hearing loss and voice change.   Eyes: Negative for eye problems.  Respiratory: Negative for chest tightness and cough.   Cardiovascular: Negative for chest pain.  Gastrointestinal: Negative for abdominal distention, abdominal pain and blood in stool.  Endocrine: Negative for hot flashes.  Genitourinary: Negative for difficulty urinating and frequency.   Musculoskeletal: Negative for arthralgias.  Skin: Negative for itching and rash.  Neurological: Negative for extremity weakness.  Hematological: Negative for adenopathy.  Psychiatric/Behavioral: Negative for confusion.    MEDICAL HISTORY:  Past Medical History:  Diagnosis Date  . Asthma   . COPD exacerbation ([place]) 04/12/2016  . Diabetes mellitus without complication ([place])   . Hyperlipemia   . Hypertension     SURGICAL HISTORY: Past Surgical History:  Procedure Laterality Date  . BREAST BIOPSY Right 07/05/2019   [place] bx venus marker, path pending  . BREAST BIOPSY Right 07/05/2019   LN bx, hydromarker, path pending  .  CESAREAN SECTION    . COLONOSCOPY WITH PROPOFOL N/A 09/11/2015   Procedure: COLONOSCOPY WITH PROPOFOL;  Surgeon: Lucilla Lame, MD;  Location: [facility] ENDOSCOPY;  Service: Endoscopy;  Laterality: N/A;  .  IR IMAGING GUIDED PORT INSERTION  07/20/2019    SOCIAL HISTORY: Social History   Socioeconomic History  . Marital status: Married    Spouse name: Not on file  . Number of children: Not on file  . Years of education: Not on file  . Highest education level: Not on file  Occupational History  . Not on file  Tobacco Use  . Smoking status: Former Smoker    Packs/day: 1.00    Years: 2.00    Pack years: 2.00    Types: Cigarettes    Quit date: 05/19/2019    Years since quitting: 0.1  . Smokeless tobacco: Never Used  Substance and Sexual Activity  . Alcohol use: Yes    Alcohol/week: 2.0 standard drinks    Types: 2 Cans of beer per week    Comment: ocassionally   . Drug use: No  . Sexual activity: Never    Birth control/protection: Abstinence  Other Topics Concern  . Not on file  Social History Narrative  . Not on file   Social Determinants of Health   Financial Resource Strain:   . Difficulty of Paying Living Expenses: Not on file  Food Insecurity:   . Worried About Charity fundraiser in the Last Year: Not on file  . Ran Out of Food in the Last Year: Not on file  Transportation Needs:   . Lack of Transportation (Medical): Not on file  . Lack of Transportation (Non-Medical): Not on file  Physical Activity:   . Days of Exercise per Week: Not on file  . Minutes of Exercise per Session: Not on file  Stress:   . Feeling of Stress : Not on file  Social Connections:   . Frequency of Communication with Friends and Family: Not on file  . Frequency of Social Gatherings with Friends and Family: Not on file  . Attends Religious Services: Not on file  . Active Member of Clubs or Organizations: Not on file  . Attends Archivist Meetings: Not on file  . Marital Status: Not on file  Intimate Partner Violence:   . Fear of Current or Ex-Partner: Not on file  . Emotionally Abused: Not on file  . Physically Abused: Not on file  . Sexually Abused: Not on file    FAMILY  HISTORY: Family History  Problem Relation Age of Onset  . Diabetes Mother   . Hypertension Mother   . Hypertension Father   . Diabetes Father     ALLERGIES:  has No Known Allergies.  MEDICATIONS:  Current Outpatient Medications  Medication Sig Dispense Refill  . amLODipine (NORVASC) 10 MG tablet Take 1 tablet (10 mg total) by mouth daily. 30 tablet 5  . bisoprolol-hydrochlorothiazide (ZIAC) 10-6.25 MG tablet Take 1 tablet by mouth daily. 30 tablet 2  . glucose blood (ACCU-CHEK SMARTVIEW) test strip 1 each by Other route as needed for other. Use as directed twice a day diag E11.65    . hydrALAZINE (APRESOLINE) 50 MG tablet Take 1 tablet (50 mg total) by mouth 3 (three) times daily. 90 tablet 3  . insulin detemir (LEVEMIR) 100 UNIT/ML injection Inject 0.4 mLs (40 Units total) into the skin daily. 10 mL 5  . Insulin Pen Needle (PEN NEEDLES) 31G X 8 MM MISC Use as  directed with insulin E11.65 100 each 1  . Lancets Misc. (ACCU-CHEK MULTICLIX LANCET DEV) KIT by Does not apply route. Use as directed twice a day diag E11.65    . lisinopril (ZESTRIL) 20 MG tablet Take 1 tablet (20 mg total) by mouth daily. 30 tablet 3  . Omega-3 Fatty Acids (FISH OIL) 1000 MG CAPS Take 1,000 mg by mouth daily.     . Semaglutide, 1 MG/DOSE, (OZEMPIC, 1 MG/DOSE,) 2 MG/1.5ML SOPN Inject 1 mg into the skin once a week. 5 pen 3   No current facility-administered medications for this visit.     PHYSICAL EXAMINATION: ECOG PERFORMANCE STATUS: 0 - Asymptomatic Vitals:   07/27/19 0857 07/27/19 0921  BP: (!) 204/102 (!) 205/97  Pulse: 69 70  Resp: 18   Temp: (!) 97.5 F (36.4 C)    Filed Weights   07/27/19 0857  Weight: 181 lb 11.2 oz (82.4 kg)    Physical Exam Constitutional:      General: She is not in acute distress. HENT:     Head: Normocephalic and atraumatic.  Eyes:     General: No scleral icterus. Cardiovascular:     Rate and Rhythm: Normal rate and regular rhythm.     Heart sounds: Normal  heart sounds.  Pulmonary:     Effort: Pulmonary effort is normal. No respiratory distress.     Breath sounds: No wheezing.  Abdominal:     General: Bowel sounds are normal. There is no distension.     Palpations: Abdomen is soft.  Musculoskeletal:        General: No deformity. Normal range of motion.     Cervical back: Normal range of motion and neck supple.  Skin:    General: Skin is warm and dry.     Findings: No erythema or rash.  Neurological:     Mental Status: She is alert and oriented to person, place, and time. Mental status is at baseline.     Cranial Nerves: No cranial nerve deficit.     Coordination: Coordination normal.  Psychiatric:        Mood and Affect: Mood normal.       LABORATORY DATA:  I have reviewed the data as listed Lab Results  Component Value Date   WBC 8.1 07/27/2019   HGB 12.5 07/27/2019   HCT 38.4 07/27/2019   MCV 85.0 07/27/2019   PLT 240 07/27/2019   Recent Labs    12/28/18 0017 07/08/19 0937 07/27/19 0843  NA 135 135 137  K 2.8* 3.8 3.6  CL 100 99 103  CO2 19* 25 23  GLUCOSE 228* 276* 180*  BUN '13 17 13  ' CREATININE 0.75 0.85 0.80  CALCIUM 9.6 9.5 9.2  GFRNONAA >60 >60 >60  GFRAA >60 >60 >60  PROT 7.8 8.3* 7.7  ALBUMIN 4.0 4.3 3.9  AST '18 17 28  ' ALT '17 15 19  ' ALKPHOS 47 41 38  BILITOT 0.4 0.7 0.5   Iron/TIBC/Ferritin/ %Sat No results found for: IRON, TIBC, FERRITIN, IRONPCTSAT    RADIOGRAPHIC STUDIES: I have personally reviewed the radiological images as listed and agreed with the findings in the report.  NM Cardiac Muga Rest  Result Date: 07/25/2019 CLINICAL DATA:  Breast cancer. Evaluate cardiac function in relation to chemotherapy. EXAM: NUCLEAR MEDICINE CARDIAC BLOOD POOL IMAGING (MUGA) TECHNIQUE: Cardiac multi-gated acquisition was performed at rest following intravenous injection of Tc-76mlabeled red blood cells. RADIOPHARMACEUTICALS:  22.8 mCi Tc-963mertechnetate in-vitro labeled red blood cells IV COMPARISON:  None FINDINGS: No  focal wall motion abnormality of the left ventricle. Calculated left ventricular ejection fraction equals 74 % IMPRESSION: Left ventricular ejection fraction equals74 %. Electronically Signed   By: Suzy Bouchard M.D.   On: 07/25/2019 15:16   US BREAST LTD UNI RIGHT INC AXILLA  Result Date: 06/30/2019 CLINICAL DATA:  Palpable lump in the right breast EXAM: DIGITAL DIAGNOSTIC BILATERAL MAMMOGRAM WITH CAD AND TOMO ULTRASOUND RIGHT BREAST COMPARISON:  Previous exam(s). ACR Breast Density Category b: There are scattered areas of fibroglandular density. FINDINGS: There is a spiculated mass in the region of the patient's palpable lump measuring 3.8 cm mammographically. No other suspicious findings are identified in either breast. Mammographic images were processed with CAD. On physical exam, there is a palpable lump in the right breast at 11 o'clock, 10 cm from the nipple. Targeted ultrasound is performed, showing an irregular hypoechoic mass at 11 o'clock, 10 cm from the nipple measuring 2.8 x 2.4 x 2.6 cm. There is a single mildly abnormal node in the low right axilla with a cortex measuring up to 4.4 mm. No other suspicious findings. IMPRESSION: Highly suspicious right breast mass. Mildly abnormal right axillary node. No abnormalities on the left. RECOMMENDATION: Recommend ultrasound-guided biopsy of the right breast mass and a mildly abnormal right axillary node. I have discussed the findings and recommendations with the patient. If applicable, a reminder letter will be sent to the patient regarding the next appointment. BI-RADS CATEGORY  5: Highly suggestive of malignancy. Electronically Signed   By: Dorise Bullion III M.D   On: 06/30/2019 12:19   MM DIAG BREAST TOMO BILATERAL  Result Date: 06/30/2019 CLINICAL DATA:  Palpable lump in the right breast EXAM: DIGITAL DIAGNOSTIC BILATERAL MAMMOGRAM WITH CAD AND TOMO ULTRASOUND RIGHT BREAST COMPARISON:  Previous exam(s). ACR Breast Density  Category b: There are scattered areas of fibroglandular density. FINDINGS: There is a spiculated mass in the region of the patient's palpable lump measuring 3.8 cm mammographically. No other suspicious findings are identified in either breast. Mammographic images were processed with CAD. On physical exam, there is a palpable lump in the right breast at 11 o'clock, 10 cm from the nipple. Targeted ultrasound is performed, showing an irregular hypoechoic mass at 11 o'clock, 10 cm from the nipple measuring 2.8 x 2.4 x 2.6 cm. There is a single mildly abnormal node in the low right axilla with a cortex measuring up to 4.4 mm. No other suspicious findings. IMPRESSION: Highly suspicious right breast mass. Mildly abnormal right axillary node. No abnormalities on the left. RECOMMENDATION: Recommend ultrasound-guided biopsy of the right breast mass and a mildly abnormal right axillary node. I have discussed the findings and recommendations with the patient. If applicable, a reminder letter will be sent to the patient regarding the next appointment. BI-RADS CATEGORY  5: Highly suggestive of malignancy. Electronically Signed   By: Dorise Bullion III M.D   On: 06/30/2019 12:19   MM CLIP PLACEMENT RIGHT  Result Date: 07/05/2019 CLINICAL DATA:  Ultrasound-guided core needle biopsies were performed of a suspicious palpable mass upper-outer quadrant right breast and an axillary lymph node with focal cortical thickening. EXAM: DIAGNOSTIC RIGHT MAMMOGRAM POST ULTRASOUND BIOPSIES COMPARISON:  Previous exam(s). FINDINGS: Mammographic images were obtained following ultrasound guided biopsy of a right breast mass in the upper-outer quadrant and ultrasound-guided biopsy of a right axillary lymph node. The Venus biopsy marking clip is in expected position at the site of the biopsied breast mass. HydroMARK biopsy clip is in the  expected location of the biopsied lymph node. There are post biopsy changes surrounding the axillary biopsy  clip. IMPRESSION: Appropriate positioning of the Venus and HydroMARK biopsy marking clips in the right breast and right axilla. Final Assessment: Post Procedure Mammograms for Marker Placement Electronically Signed   By: Curlene Dolphin M.D.   On: 07/05/2019 09:19   [place] RT BREAST BX W LOC DEV 1ST LESION IMG BX SPEC US GUIDE  Addendum Date: 07/06/2019   ADDENDUM REPORT: 07/06/2019 11:53 ADDENDUM: Pathology revealed GRADE III INVASIVE MAMMARY CARCINOMA, NO SPECIAL TYPE of the RIGHT breast at 10-11 o'clock, 10 cm from nipple. This was found to be concordant by Dr. Curlene Dolphin. Pathology revealed PREDOMINANTLY BLOOD AND FIBROADIPOSE TISSUE, WITH SCANT LYMPHOID TISSUE PRESENT of the RIGHT axillary lymph node. This was found to be concordant by Dr. Curlene Dolphin. Correlate with sentinel lymph node biopsy. Pathology results were discussed with the patient by telephone. The patient reported doing well after the biopsy with tenderness at the site. Post biopsy instructions and care were reviewed and questions were answered. The patient was encouraged to call [facility] [place] [facility] for any additional concerns. Surgical and Oncologist referral will be arranged by Al Pimple RN and Tanya Nones RN of [place]. Pathology results reported by Stacie Acres RN on 07/06/2019. Electronically Signed   By: Curlene Dolphin M.D.   On: 07/06/2019 11:53   Result Date: 07/06/2019 CLINICAL DATA:  Ultrasound-guided core needle biopsy was recommended of a suspicious palpable mass in the upper-outer quadrant of the right breast. EXAM: ULTRASOUND GUIDED RIGHT BREAST CORE NEEDLE BIOPSY COMPARISON:  Previous exam(s). FINDINGS: I met with the patient and we discussed the procedure of ultrasound-guided biopsy, including benefits and alternatives. We discussed the high likelihood of a successful procedure. We discussed the risks of the procedure, including infection, bleeding, tissue  injury, clip migration, and inadequate sampling. Informed written consent was given. The usual time-out protocol was performed immediately prior to the procedure. Lesion quadrant: Upper outer quadrant Using sterile technique and 1% Lidocaine as local anesthetic, under direct ultrasound visualization, a 12 gauge spring-loaded device was used to perform biopsy of a 2.8 cm palpable mass in the 10-11 o'clock axis of the right breast approximately 10 cm from the nipple using a lateral approach. At the conclusion of the procedure Venus tissue marker clip was deployed into the biopsy cavity. Follow up 2 view mammogram was performed and dictated separately. IMPRESSION: Ultrasound guided biopsy of the right breast. No apparent complications. Electronically Signed: By: Curlene Dolphin M.D. On: 07/05/2019 09:15   [place] RT BREAST BX W LOC DEV EA ADD LESION IMG BX SPEC US GUIDE  Addendum Date: 07/06/2019   ADDENDUM REPORT: 07/06/2019 11:56 ADDENDUM: Pathology revealed GRADE III INVASIVE MAMMARY CARCINOMA, NO SPECIAL TYPE of the RIGHT breast at 10-11 o'clock, 10 cm from nipple. This was found to be concordant by Dr. Curlene Dolphin. Pathology revealed PREDOMINANTLY BLOOD AND FIBROADIPOSE TISSUE, WITH SCANT LYMPHOID TISSUE PRESENT of the RIGHT axillary lymph node. This was found to be concordant by Dr. Curlene Dolphin. Correlate with sentinel lymph node biopsy. Pathology results were discussed with the patient by telephone. The patient reported doing well after the biopsy with tenderness at the site. Post biopsy instructions and care were reviewed and questions were answered. The patient was encouraged to call [facility] [place] [facility] for any additional concerns. Surgical and Oncologist referral will be arranged by Al Pimple  RN and Tanya Nones RN of [place]. Pathology results reported by Stacie Acres RN on 07/06/2019. Electronically Signed   By: Curlene Dolphin M.D.   On:  07/06/2019 11:56   Result Date: 07/06/2019 CLINICAL DATA:  Ultrasound-guided core needle biopsy was recommended of a right axillary lymph node with cortical thickening. EXAM: ULTRASOUND GUIDED RIGHT AXILLA CORE NEEDLE BIOPSY COMPARISON:  Previous exam(s). FINDINGS: I met with the patient and we discussed the procedure of ultrasound-guided biopsy, including benefits and alternatives. We discussed the high likelihood of a successful procedure. We discussed the risks of the procedure, including infection, bleeding, tissue injury, clip migration, and inadequate sampling. Informed written consent was given. The usual time-out protocol was performed immediately prior to the procedure. Using sterile technique and 1% Lidocaine as local anesthetic, under direct ultrasound visualization, a 14 gauge spring-loaded device was used to perform biopsy of an axillary lymph node with focal cortical thickening using a lateral approach. At the conclusion of the procedure [facility] tissue marker clip was deployed into the biopsy cavity. Follow up 2 view mammogram was performed and dictated separately. IMPRESSION: Ultrasound guided biopsy of a right axillary lymph node. No apparent complications. Electronically Signed: By: Curlene Dolphin M.D. On: 07/05/2019 09:16   IR IMAGING GUIDED PORT INSERTION  Result Date: 07/20/2019 CLINICAL DATA:  Right breast carcinoma and need for porta cath for chemotherapy. EXAM: IMPLANTED PORT A CATH PLACEMENT WITH ULTRASOUND AND FLUOROSCOPIC GUIDANCE ANESTHESIA/SEDATION: 1.0 mg IV Versed; 50 mcg IV Fentanyl Total Moderate Sedation Time:  28 minutes The patient's level of consciousness and physiologic status were continuously monitored during the procedure by Radiology nursing. Additional Medications: 2 g IV Ancef. FLUOROSCOPY TIME:  30 seconds. PROCEDURE: The procedure, risks, benefits, and alternatives were explained to the patient. Questions regarding the procedure were encouraged and answered. The  patient understands and consents to the procedure. A time-out was performed prior to initiating the procedure. Ultrasound was utilized to confirm patency of the left internal jugular vein. The left neck and chest were prepped with chlorhexidine in a sterile fashion, and a sterile drape was applied covering the operative field. Maximum barrier sterile technique with sterile gowns and gloves were used for the procedure. Local anesthesia was provided with 1% lidocaine. After creating a small venotomy incision, a 21 gauge needle was advanced into the left internal jugular vein under direct, real-time ultrasound guidance. Ultrasound image documentation was performed. After securing guidewire access, an 8 Fr dilator was placed. A J-wire was kinked to measure appropriate catheter length. A subcutaneous port pocket was then created along the upper chest wall utilizing sharp and blunt dissection. Portable cautery was utilized. The pocket was irrigated with sterile saline. A single lumen power injectable port was chosen for placement. The 8 Fr catheter was tunneled from the port pocket site to the venotomy incision. The port was placed in the pocket. External catheter was trimmed to appropriate length based on guidewire measurement. At the venotomy, an 8 Fr peel-away sheath was placed over a guidewire. The catheter was then placed through the sheath and the sheath removed. Final catheter positioning was confirmed and documented with a fluoroscopic spot image. The port was accessed with a needle and aspirated and flushed with heparinized saline. The access needle was removed. The venotomy and port pocket incisions were closed with subcutaneous 3-0 Monocryl and subcuticular 4-0 Vicryl. Dermabond was applied to both incisions. COMPLICATIONS: COMPLICATIONS None FINDINGS: After catheter placement, the tip lies at the cavo-atrial junction. The catheter  aspirates normally and is ready for immediate use. IMPRESSION: Placement of  single lumen port a cath via left internal jugular vein. The catheter tip lies at the cavo-atrial junction. A power injectable port a cath was placed and is ready for immediate use. Electronically Signed   By: Aletta Edouard M.D.   On: 07/20/2019 16:28      ASSESSMENT & PLAN:  1. Malignant neoplasm of upper-outer quadrant of right breast in female, estrogen receptor negative ([place])   2. Goals of care, counseling/discussion   3. Accelerated hypertension   4. Uncontrolled type 2 diabetes mellitus with hyperglycemia (HCC)    cT2N0 grade 3 invasive mammary carcinoma.  ER negative, PR weakly positive (<=10%), HER-2 negative Discussed with patient that she will be treated as similar way of triple negative breast cancer. Recommend neoadjuvant chemotherapy with Adriamycin continue regimen. Hold chemotherapy today due to high blood pressure. We discussed about the rationale of chemotherapy and potential side effects. Patient declines scalp cooling system utilization. She has had a Mediport placed.  MUGA results were reviewed and discussed with patient.  Normal left ventricle ejection fraction at baseline.  She does not have any pre-existing cardiac problems. I also discussed with her about the instructions of antiemetics and dexamethasone.  Also advised patient to apply EMLA cream prior to coming to cancer center for treatments. Goal of care, curative intent discussed with patient.  Accelerating hypertension, patient is not compliant with blood pressure medication. Discussed with patient about the necessity of being compliant with the blood pressure medications, and I recommend patient to monitor blood pressure at home 1-2 times per week and follow-up closely with primary care provider.Low salt diet.  I also recommend patient to go to emergency room for immediate management to lower her blood pressure.  She is at risk of developing stroke, heart attack etc.  Patient declines.  She will go home and  take her medication.  Advised her to monitor blood pressure and if it is persistently high she should go to emergency room for further evaluation and management.  She voices understanding.  #Diabetes, recent A1c on 06/20/2019 was 8.2.  Poorly controlled.  I discussed with patient about lifestyle modification, compliance with diabetes medications and possibilities of worsening of her glucose level while on chemotherapy/use of steroids.  No orders of the defined types were placed in this encounter.   All questions were answered. The patient knows to call the clinic with any problems questions or concerns.  cc Lavera Guise, MD    Return of visit:  1 week for re-evaluation.    Earlie Server, MD, PhD Hematology Oncology [facility] at [facility] Pager- [phone] 07/27/2019

## 2019-07-27 NOTE — Progress Notes (Signed)
[facility] [place], [place] [address]  Internal MEDICINE  Office Visit Note  Patient Name: Dawn Alvarez  [id number]  [id number]  Date of Service: 07/27/2019  Chief Complaint  Patient presents with  . Hypertension     HPI Pt is here for a sick visit. She present to cancer center for treatment this morning and was found to have HTN.  She reports she has not been taking her medications.  She is currently on hydralazine 82m Tid, ziac 1 tab daily, and amlodipine 169mdaily.  She was unable to get her treatment today, and needs to have her bp addressed. She Denies Chest pain, Shortness of breath, palpitations, headache, or blurred vision.      Current Medication:  Outpatient Encounter Medications as of 07/27/2019  Medication Sig  . amLODipine (NORVASC) 10 MG tablet Take 1 tablet (10 mg total) by mouth daily.  . bisoprolol-hydrochlorothiazide (ZIAC) 10-6.25 MG tablet Take 1 tablet by mouth daily.  . Marland Kitchenlucose blood (ACCU-CHEK SMARTVIEW) test strip 1 each by Other route as needed for other. Use as directed twice a day diag E11.65  . hydrALAZINE (APRESOLINE) 50 MG tablet Take 1 tablet (50 mg total) by mouth 3 (three) times daily.  . insulin detemir (LEVEMIR) 100 UNIT/ML injection Inject 0.4 mLs (40 Units total) into the skin daily.  . Insulin Pen Needle (PEN NEEDLES) 31G X 8 MM MISC Use as directed with insulin E11.65  . Lancets Misc. (ACCU-CHEK MULTICLIX LANCET DEV) KIT by Does not apply route. Use as directed twice a day diag E11.65  . lisinopril (ZESTRIL) 20 MG tablet Take 1 tablet (20 mg total) by mouth daily.  . Omega-3 Fatty Acids (FISH OIL) 1000 MG CAPS Take 1,000 mg by mouth daily.   . Semaglutide, 1 MG/DOSE, (OZEMPIC, 1 MG/DOSE,) 2 MG/1.5ML SOPN Inject 1 mg into the skin once a week.  . [DISCONTINUED] dexamethasone (DECADRON) 4 MG tablet Take 2 tablets by mouth daily starting the day after Carboplatin and Cytoxan x 2 days. Take with food. (Patient not  taking: Reported on 07/27/2019)  . [DISCONTINUED] lidocaine-prilocaine (EMLA) cream Apply to affected area once (Patient not taking: Reported on 07/27/2019)  . [DISCONTINUED] prochlorperazine (COMPAZINE) 10 MG tablet Take 1 tablet (10 mg total) by mouth every 6 (six) hours as needed (Nausea or vomiting). (Patient not taking: Reported on 07/27/2019)  . [DISCONTINUED] sodium chloride flush (NS) 0.9 % injection 10 mL    No facility-administered encounter medications on file as of 07/27/2019.      Medical History: Past Medical History:  Diagnosis Date  . Asthma   . [place]  . Diabetes mellitus without complication ([place]  . Hyperlipemia   . Hypertension      Vital Signs: BP (!) 203/100   Pulse 74   Temp (!) 97.5 F (36.4 C)   Resp 16   Ht 5' 3" (1.6 m)   Wt 182 lb (82.6 kg)   SpO2 97%   BMI 32.24 kg/m    Review of Systems  Constitutional: Negative for chills, fatigue and unexpected weight change.  HENT: Negative for congestion, rhinorrhea, sneezing and sore throat.   Eyes: Negative for photophobia, pain and redness.  Respiratory: Negative for cough, chest tightness and shortness of breath.   Cardiovascular: Negative for chest pain and palpitations.  Gastrointestinal: Negative for abdominal pain, constipation, diarrhea, nausea and vomiting.  Endocrine: Negative.   Genitourinary: Negative for dysuria and frequency.  Musculoskeletal: Negative for arthralgias, back  pain, joint swelling and neck pain.  Skin: Negative for rash.  Allergic/Immunologic: Negative.   Neurological: Negative for tremors and numbness.  Hematological: Negative for adenopathy. Does not bruise/bleed easily.  Psychiatric/Behavioral: Negative for behavioral problems and sleep disturbance. The patient is not nervous/anxious.     Physical Exam Vitals and nursing note reviewed.  Constitutional:      General: She is not in acute distress.    Appearance: She is well-developed. She is  not diaphoretic.  HENT:     Head: Normocephalic and atraumatic.     Mouth/Throat:     Pharynx: No oropharyngeal exudate.  Eyes:     Pupils: Pupils are equal, round, and reactive to light.  Neck:     Thyroid: No thyromegaly.     Vascular: No JVD.     Trachea: No tracheal deviation.  Cardiovascular:     Rate and Rhythm: Normal rate and regular rhythm.     Heart sounds: Normal heart sounds. No murmur. No friction rub. No gallop.   Pulmonary:     Effort: Pulmonary effort is normal. No respiratory distress.     Breath sounds: Normal breath sounds. No wheezing or rales.  Chest:     Chest wall: No tenderness.  Abdominal:     Palpations: Abdomen is soft.     Tenderness: There is no abdominal tenderness. There is no guarding.  Musculoskeletal:        General: Normal range of motion.     Cervical back: Normal range of motion and neck supple.  Lymphadenopathy:     Cervical: No cervical adenopathy.  Skin:    General: Skin is warm and dry.  Neurological:     Mental Status: She is alert and oriented to person, place, and time.     Cranial Nerves: No cranial nerve deficit.  Psychiatric:        Behavior: Behavior normal.        Thought Content: Thought content normal.        Judgment: Judgment normal.    Assessment/Plan: 1. Essential hypertension BP is elevated. On recheck is 180/96.  Patient has not taken her bp meds as prescribed.  Once again reviewed how to take medications and increased dose of hydralazine..   2. Compliance poor Take medications as prescribed, and follow up next week    General Counseling: Dawn Alvarez verbalizes understanding of the findings of todays visit and agrees with plan of treatment. I have discussed any further diagnostic evaluation that may be needed or ordered today. We also reviewed her medications today. she has been encouraged to call the office with any questions or concerns that should arise related to todays visit.   No orders of the defined types were  placed in this encounter.   No orders of the defined types were placed in this encounter.   Time spent: 25 Minutes  This patient was seen by Orson Gear AGNP-C in Collaboration with Dr Lavera Guise as a part of collaborative care agreement.  Kendell Bane AGNP-C Internal Medicine

## 2019-07-27 NOTE — Progress Notes (Signed)
Patient here for follow up. No concerns voiced. Patient made aware that premeds were sent to pharmacy and should be picked up.

## 2019-07-29 ENCOUNTER — Telehealth: Payer: Self-pay

## 2019-07-29 NOTE — Telephone Encounter (Signed)
Telephone call to pt for follow up after receiving first chemo.  Went straight to voice mail and said "mail box is full".  Unable to leave message.

## 2019-07-30 ENCOUNTER — Emergency Department: Payer: Medicare HMO

## 2019-07-30 ENCOUNTER — Encounter: Payer: Self-pay | Admitting: Emergency Medicine

## 2019-07-30 ENCOUNTER — Other Ambulatory Visit: Payer: Self-pay

## 2019-07-30 ENCOUNTER — Emergency Department
Admission: EM | Admit: 2019-07-30 | Discharge: 2019-07-30 | Disposition: A | Discharge status: Home / Self Care | Payer: Medicare HMO | Attending: Emergency Medicine | Admitting: Emergency Medicine

## 2019-07-30 DIAGNOSIS — I10 Essential (primary) hypertension: Secondary | ICD-10-CM | POA: Diagnosis not present

## 2019-07-30 DIAGNOSIS — R410 Disorientation, unspecified: Secondary | ICD-10-CM | POA: Diagnosis not present

## 2019-07-30 DIAGNOSIS — C50411 Malignant neoplasm of upper-outer quadrant of right female breast: Secondary | ICD-10-CM | POA: Diagnosis not present

## 2019-07-30 DIAGNOSIS — E119 Type 2 diabetes mellitus without complications: Secondary | ICD-10-CM | POA: Diagnosis not present

## 2019-07-30 DIAGNOSIS — Z79899 Other long term (current) drug therapy: Secondary | ICD-10-CM | POA: Diagnosis not present

## 2019-07-30 DIAGNOSIS — C50919 Malignant neoplasm of unspecified site of unspecified female breast: Secondary | ICD-10-CM | POA: Diagnosis not present

## 2019-07-30 DIAGNOSIS — Z794 Long term (current) use of insulin: Secondary | ICD-10-CM | POA: Diagnosis not present

## 2019-07-30 DIAGNOSIS — R4182 Altered mental status, unspecified: Secondary | ICD-10-CM | POA: Diagnosis not present

## 2019-07-30 LAB — COMPREHENSIVE METABOLIC PANEL
ALT: 15 U/L (ref 0–44)
AST: 19 U/L (ref 15–41)
Albumin: 4.2 g/dL (ref 3.5–5.0)
Alkaline Phosphatase: 38 U/L (ref 38–126)
Anion gap: 8 (ref 5–15)
BUN: 19 mg/dL (ref 8–23)
CO2: 26 mmol/L (ref 22–32)
Calcium: 10.2 mg/dL (ref 8.9–10.3)
Chloride: 103 mmol/L (ref 98–111)
Creatinine, Ser: 0.93 mg/dL (ref 0.44–1.00)
GFR calc Af Amer: >60 mL/min (ref >60)
GFR calc non Af Amer: >60 mL/min (ref >60)
Glucose, Bld: 122 mg/dL — ABNORMAL HIGH (ref 70–99)
Potassium: 4.1 mmol/L (ref 3.5–5.1)
Sodium: 137 mmol/L (ref 135–145)
Total Bilirubin: 0.7 mg/dL (ref 0.3–1.2)
Total Protein: 8.3 g/dL — ABNORMAL HIGH (ref 6.5–8.1)

## 2019-07-30 LAB — GLUCOSE, CAPILLARY: Glucose-Capillary: 113 mg/dL — ABNORMAL HIGH (ref 70–99)

## 2019-07-30 LAB — URINALYSIS, ROUTINE W REFLEX MICROSCOPIC
Bilirubin Urine: NEGATIVE
Glucose, UA: NEGATIVE mg/dL
Hgb urine dipstick: NEGATIVE
Ketones, ur: NEGATIVE mg/dL
Leukocytes,Ua: NEGATIVE
Nitrite: NEGATIVE
Protein, ur: NEGATIVE mg/dL
Specific Gravity, Urine: 1.008 (ref 1.005–1.030)
pH: 5 (ref 5.0–8.0)

## 2019-07-30 LAB — CBC
HCT: 42.1 % (ref 36.0–46.0)
Hemoglobin: 14 g/dL (ref 12.0–15.0)
MCH: 27.8 pg (ref 26.0–34.0)
MCHC: 33.3 g/dL (ref 30.0–36.0)
MCV: 83.5 fL (ref 80.0–100.0)
Platelets: 292 10*3/uL (ref 150–400)
RBC: 5.04 MIL/uL (ref 3.87–5.11)
RDW: 13.7 % (ref 11.5–15.5)
WBC: 7.9 10*3/uL (ref 4.0–10.5)
nRBC: 0 % (ref 0.0–0.2)

## 2019-07-30 LAB — TROPONIN I (HIGH SENSITIVITY): Troponin I (High Sensitivity): 8 ng/L (ref <18)

## 2019-07-30 MED ORDER — SODIUM CHLORIDE 0.9% FLUSH: 3.0000 mL | Freq: Once | INTRAVENOUS | Status: DC

## 2019-07-30 NOTE — ED Notes (Signed)
PT discussed with Dr. Kerman Passey, new orders given.

## 2019-07-30 NOTE — ED Notes (Signed)
SPoke with daughter Virgilio Belling re: patient condition, states pt was due to have Chemo this past Monday, but was post-poned due to pt's BP being elevated. Pt seen by PCP Monday and increased BP meds with a recheck this coming Tuesday.  Per DTR, she was at work last night so last known well for her was yesterday evening.  DTR states pt called the pt's sister around 1230-1pm and stated the patient was talking out of her head.  DTR went to check on her a noted pt to be warm to the touch and eyes swollen.  The pt also started asking about her mother who has been deceased for several years. These are new findings per daughter.  No changes in speech noted.

## 2019-07-30 NOTE — ED Provider Notes (Signed)
[facility] Emergency Department Provider Note  ____________________________________________  Time seen: Approximately 6:14 PM  I have reviewed the triage vital signs and the nursing notes.   HISTORY  Chief Complaint Hypertension and Altered Mental Status    HPI Dawn Alvarez is a 64 y.o. female who presents the emergency department with her daughter for complaint of confusion, elevated blood pressure.  According to the daughter, the patient has a new diagnosis of breast cancer.  She has seen oncology, had been scheduled to start chemotherapy this week.  Patient had presented to the cancer center this week, had an elevated blood pressure reading and was sent to primary care for evaluation.  Primary care had made adjustments to her blood pressure medications and she is supposed to follow-up at the start of this week to ensure she is safe for chemotherapy.  Patient was asymptomatic last night.  The daughter went to work overnight, she returned home and went to bed.  She states that she received a call from her aunt, the patient's sister that the patient had called the patient's sister and was talking about their mother who was deceased.   Currently had no complaints.  Patient was very drowsy and preferred to sleep through most of the time in the emergency department.        Past Medical History:  Diagnosis Date  . Asthma   . COPD exacerbation ([place]) 04/12/2016  . Diabetes mellitus without complication ([place])   . Hyperlipemia   . Hypertension     Patient Active Problem List   Diagnosis Date Noted  . Goals of care, counseling/discussion 07/27/2019  . Malignant neoplasm of upper-outer quadrant of right breast in female, estrogen receptor negative ([place]) 07/27/2019  . Malignant neoplasm of upper-outer quadrant of right female breast ([place]) 07/08/2019  . Mass of soft tissue of chest 06/18/2019  . Mass of axilla, left 06/18/2019  . Encounter for screening mammogram  for malignant neoplasm of breast 06/18/2019  . Visual changes 06/18/2019  . 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 Acute upper respiratory infection 04/15/2018  . Mixed hyperlipidemia 01/10/2018  . Dysuria 10/11/2017  . Headache, acute 06/24/2017  . Type II diabetes mellitus, uncontrolled ([place]) 06/23/2017  . Essential hypertension 06/23/2017  . Syncope 04/12/2016  . Dizziness and giddiness 04/12/2016  . Acute bronchitis 04/12/2016  . COPD exacerbation ([place]) 04/12/2016  . Tobacco abuse counseling 04/12/2016  . Atypical chest pain 04/11/2016  . Encounter for general adult medical examination with abnormal findings   . Benign neoplasm of sigmoid colon     Past Surgical History:  Procedure Laterality Date  . BREAST BIOPSY Right 07/05/2019   [place] bx venus marker, path pending  . BREAST BIOPSY Right 07/05/2019   LN bx, hydromarker, path pending  . CESAREAN SECTION    . COLONOSCOPY WITH PROPOFOL N/A 09/11/2015   Procedure: COLONOSCOPY WITH PROPOFOL;  Surgeon: Lucilla Lame, MD;  Location: [facility] ENDOSCOPY;  Service: Endoscopy;  Laterality: N/A;  . IR IMAGING GUIDED PORT INSERTION  07/20/2019    Prior to Admission medications   Medication Sig Start Date End Date Taking? Authorizing Provider  amLODipine (NORVASC) 10 MG tablet Take 1 tablet (10 mg total) by mouth daily. 01/05/19   Kendell Bane, NP  bisoprolol-hydrochlorothiazide (ZIAC) 10-6.25 MG tablet Take 1 tablet by mouth daily. 12/29/18   Kendell Bane, NP  glucose blood (ACCU-CHEK SMARTVIEW) test strip 1 each by Other route as needed for other. Use as directed twice a day diag  E11.65    [provider]  hydrALAZINE (APRESOLINE) 50 MG tablet Take 1 tablet (50 mg total) by mouth 3 (three) times daily. 01/05/19   Kendell Bane, NP  insulin detemir (LEVEMIR) 100 UNIT/ML injection Inject 0.4 mLs (40 Units total) into the skin daily. 06/14/19   [name], NP  Insulin Pen Needle (PEN NEEDLES) 31G X 8 MM MISC Use as directed  with insulin E11.65 03/30/18   [name], NP  Lancets Misc. (ACCU-CHEK MULTICLIX LANCET DEV) KIT by Does not apply route. Use as directed twice a day diag E11.65    [provider]  lisinopril (ZESTRIL) 20 MG tablet Take 1 tablet (20 mg total) by mouth daily. 04/25/19   Kendell Bane, NP  Omega-3 Fatty Acids (FISH OIL) 1000 MG CAPS Take 1,000 mg by mouth daily.  04/12/16   [provider]  Semaglutide, 1 MG/DOSE, (OZEMPIC, 1 MG/DOSE,) 2 MG/1.5ML SOPN Inject 1 mg into the skin once a week. 03/09/19   Kendell Bane, NP    Allergies Patient has no known allergies.  Family History  Problem Relation Age of Onset  . Diabetes Mother   . Hypertension Mother   . Hypertension Father   . Diabetes Father     Social History Social History   Tobacco Use  . Smoking status: Former Smoker    Packs/day: 1.00    Years: 2.00    Pack years: 2.00    Types: Cigarettes    Quit date: 05/19/2019    Years since quitting: 0.1  . Smokeless tobacco: Never Used  Substance Use Topics  . Alcohol use: Yes    Alcohol/week: 2.0 standard drinks    Types: 2 Cans of beer per week    Comment: ocassionally   . Drug use: No     Review of Systems  Constitutional: No fever/chills Eyes: No visual changes. No discharge ENT: No upper respiratory complaints. Cardiovascular: no chest pain. Respiratory: no cough. No SOB. Gastrointestinal: No abdominal pain.  No nausea, no vomiting.  No diarrhea.  No constipation. Genitourinary: Negative for dysuria. No hematuria Musculoskeletal: Negative for musculoskeletal pain. Skin: Negative for rash, abrasions, lacerations, ecchymosis. Neurological: Negative for headaches, focal weakness or numbness.  Positive for confusion according to the patient's daughter. 10-point ROS otherwise negative.  ____________________________________________   PHYSICAL EXAM:  VITAL SIGNS: ED Triage Vitals  Enc Vitals Group     BP 07/30/19 1405 135/69      Pulse Rate 07/30/19 1405 (!) 58     Resp 07/30/19 1405 16     Temp 07/30/19 1405 98.5 F (36.9 C)     Temp Source 07/30/19 1405 Oral     SpO2 07/30/19 1405 98 %     Weight 07/30/19 1407 182 lb (82.6 kg)     Height 07/30/19 1407 5' 3" (1.6 m)     Head Circumference --      Peak Flow --      Pain Score 07/30/19 1407 0     Pain Loc --      Pain Edu? --      Excl. in [place]? --      Constitutional: Alert and oriented. Well appearing and in no acute distress.  Patient is very drowsy, patient sleeps through most of the ED visit.  Patient is easily arousable however.  Answers questions appropriately when asked. Eyes: Conjunctivae are normal. PERRL. EOMI. Head: Atraumatic. ENT:      Ears:  Nose: No congestion/rhinnorhea.      Mouth/Throat: Mucous membranes are moist.  Neck: No stridor.  Neck is supple full range of motion  Cardiovascular: Normal rate, regular rhythm. Normal S1 and S2.  Good peripheral circulation. Respiratory: Normal respiratory effort without tachypnea or retractions. Lungs CTAB. Good air entry to the bases with no decreased or absent breath sounds. Gastrointestinal: Bowel sounds 4 quadrants. Soft and nontender to palpation. No guarding or rigidity. No palpable masses. No distention. No CVA tenderness. Musculoskeletal: Full range of motion to all extremities. No gross deformities appreciated. Neurologic:  Normal speech and language. No gross focal neurologic deficits are appreciated.  Skin:  Skin is warm, dry and intact. No rash noted. Psychiatric: Mood and affect are normal. Speech and behavior are normal. Patient exhibits appropriate insight and judgement.   ____________________________________________   LABS (all labs ordered are listed, but only abnormal results are displayed)  Labs Reviewed  COMPREHENSIVE METABOLIC PANEL - Abnormal; Notable for the following components:      Result Value   Glucose, Bld 122 (*)    Total Protein 8.3 (*)    All other  components within normal limits  URINALYSIS, ROUTINE W REFLEX MICROSCOPIC - Abnormal; Notable for the following components:   Color, Urine YELLOW (*)    APPearance CLEAR (*)    All other components within normal limits  GLUCOSE, CAPILLARY - Abnormal; Notable for the following components:   Glucose-Capillary 113 (*)    All other components within normal limits  CBC  CBG MONITORING, ED  TROPONIN I (HIGH SENSITIVITY)   ____________________________________________  EKG   ____________________________________________  RADIOLOGY I personally viewed and evaluated these images as part of my medical decision making, as well as reviewing the written report by the radiologist.  CT Head Wo Contrast  Result Date: 07/30/2019 CLINICAL DATA:  64 year old female with altered mental status EXAM: CT HEAD WITHOUT CONTRAST TECHNIQUE: Contiguous axial images were obtained from the base of the skull through the vertex without intravenous contrast. COMPARISON:  MR 05/04/2010, CT 12/28/2018 FINDINGS: Brain: No acute intracranial hemorrhage. No midline shift or mass effect. Gray-white differentiation maintained. Confluent hypodensity in the periventricular white matter, similar to the prior MR and CT. Unremarkable appearance of the ventricular system. Vascular: Mild atherosclerotic calcifications. Skull: No acute fracture.  No aggressive bone lesion identified. Sinuses/Orbits: Unremarkable appearance of the orbits. Mastoid air cells clear. No middle ear effusion. No significant sinus disease. Other: None IMPRESSION: Negative for acute intracranial abnormality. Chronic microvascular ischemic disease. Electronically Signed   By: Corrie Mckusick D.O.   On: 07/30/2019 14:46    ____________________________________________    PROCEDURES  Procedure(s) performed:    Procedures    Medications - No data to display   ____________________________________________   INITIAL IMPRESSION / ASSESSMENT AND PLAN / ED  COURSE  Pertinent labs & imaging results that were available during my care of the patient were reviewed by me and considered in my medical decision making (see chart for details).  Review of the [place] CSRS was performed in accordance of the [place] prior to dispensing any controlled drugs.           Patient's diagnosis is consistent with confusion.  Patient presented to the emergency department with her daughter for evaluation of possible altered mental status.  Patient has recent diagnosis of breast cancer, is being seen by oncology.  Patient was supposed to start chemotherapy this week, however had findings of significantly elevated blood pressure readings.  Patient was seen by primary  care, blood pressure medications were adjusted.  She has follow-up in 2 days.  Patient apparently was confused while talking with the patient's sister.  The patient has been talking about their mother.  Their mother had passed away years ago.  Patient was brought to the emergency department for evaluation.  Work-up is reassuring.  No gross findings on physical exam.  Patient is stable for discharge.  No indication for further work-up or admission.  Follow-up with primary care in 2 days.  Patient is given ED precautions to return to the ED for any worsening or new symptoms.     ____________________________________________  FINAL CLINICAL IMPRESSION(S) / ED DIAGNOSES  Final diagnoses:  Confusion  Malignant neoplasm of upper-outer quadrant of right female breast, unspecified estrogen receptor status ([place])      NEW MEDICATIONS STARTED DURING THIS VISIT:  ED Discharge Orders    None          This chart was dictated using voice recognition software/Dragon. Despite best efforts to proofread, errors can occur which can change the meaning. Any change was purely unintentional.    Darletta Moll, PA-C 07/30/19 1845    Duffy Bruce, MD 08/02/19 [phone]

## 2019-07-30 NOTE — ED Triage Notes (Signed)
PT arrived via POV with family , reports elevated blood pressure, family reported at registration desk that the patient has not been acting herself lately as well.  Pt states she has right side breast CA and is under going chemo.  Pt is difficult to understand at times as well and is frequently drowsy during triage assessment.

## 2019-07-30 NOTE — ED Notes (Signed)
First Nurse Note: Pt to ED with family who states that she has been having issues with her blood pressure. States that pt has been talking about her mother and not acting like herself. Pt does not appear to be in any distress at this time.

## 2019-08-01 DIAGNOSIS — H40003 Preglaucoma, unspecified, bilateral: Secondary | ICD-10-CM | POA: Diagnosis not present

## 2019-08-02 ENCOUNTER — Ambulatory Visit: Payer: Medicare HMO | Admitting: Internal Medicine

## 2019-08-04 ENCOUNTER — Inpatient Hospital Stay: Payer: Medicare HMO

## 2019-08-04 ENCOUNTER — Inpatient Hospital Stay (HOSPITAL_BASED_OUTPATIENT_CLINIC_OR_DEPARTMENT_OTHER): Payer: Medicare HMO | Admitting: Oncology

## 2019-08-04 ENCOUNTER — Ambulatory Visit: Payer: Medicare HMO | Admitting: Internal Medicine

## 2019-08-04 ENCOUNTER — Encounter: Payer: Self-pay | Admitting: Oncology

## 2019-08-04 ENCOUNTER — Other Ambulatory Visit: Payer: Self-pay

## 2019-08-04 ENCOUNTER — Inpatient Hospital Stay: Payer: Medicare HMO | Attending: Oncology

## 2019-08-04 VITALS — BP 149/74 | HR 63 | Temp 96.2°F | Resp 16 | Wt 175.0 lb

## 2019-08-04 DIAGNOSIS — D701 Agranulocytosis secondary to cancer chemotherapy: Secondary | ICD-10-CM | POA: Insufficient documentation

## 2019-08-04 DIAGNOSIS — Z5111 Encounter for antineoplastic chemotherapy: Secondary | ICD-10-CM | POA: Insufficient documentation

## 2019-08-04 DIAGNOSIS — E1165 Type 2 diabetes mellitus with hyperglycemia: Secondary | ICD-10-CM | POA: Diagnosis not present

## 2019-08-04 DIAGNOSIS — Z79899 Other long term (current) drug therapy: Secondary | ICD-10-CM | POA: Diagnosis not present

## 2019-08-04 DIAGNOSIS — C50411 Malignant neoplasm of upper-outer quadrant of right female breast: Secondary | ICD-10-CM

## 2019-08-04 DIAGNOSIS — Z171 Estrogen receptor negative status [ER-]: Secondary | ICD-10-CM

## 2019-08-04 DIAGNOSIS — E860 Dehydration: Secondary | ICD-10-CM

## 2019-08-04 DIAGNOSIS — Z5189 Encounter for other specified aftercare: Secondary | ICD-10-CM | POA: Diagnosis not present

## 2019-08-04 LAB — COMPREHENSIVE METABOLIC PANEL
ALT: 14 U/L (ref 0–44)
AST: 18 U/L (ref 15–41)
Albumin: 4 g/dL (ref 3.5–5.0)
Alkaline Phosphatase: 35 U/L — ABNORMAL LOW (ref 38–126)
Anion gap: 11 (ref 5–15)
BUN: 29 mg/dL — ABNORMAL HIGH (ref 8–23)
CO2: 22 mmol/L (ref 22–32)
Calcium: 9.6 mg/dL (ref 8.9–10.3)
Chloride: 104 mmol/L (ref 98–111)
Creatinine, Ser: 1.21 mg/dL — ABNORMAL HIGH (ref 0.44–1.00)
GFR calc Af Amer: 55 mL/min — ABNORMAL LOW (ref >60)
GFR calc non Af Amer: 48 mL/min — ABNORMAL LOW (ref >60)
Glucose, Bld: 167 mg/dL — ABNORMAL HIGH (ref 70–99)
Potassium: 3.9 mmol/L (ref 3.5–5.1)
Sodium: 137 mmol/L (ref 135–145)
Total Bilirubin: 0.6 mg/dL (ref 0.3–1.2)
Total Protein: 7.9 g/dL (ref 6.5–8.1)

## 2019-08-04 LAB — CBC WITH DIFFERENTIAL/PLATELET
Abs Immature Granulocytes: 0.02 10*3/uL (ref 0.00–0.07)
Basophils Absolute: 0.1 10*3/uL (ref 0.0–0.1)
Basophils Relative: 1 %
Eosinophils Absolute: 0.3 10*3/uL (ref 0.0–0.5)
Eosinophils Relative: 4 %
HCT: 38.2 % (ref 36.0–46.0)
Hemoglobin: 12.4 g/dL (ref 12.0–15.0)
Immature Granulocytes: 0 %
Lymphocytes Relative: 34 %
Lymphs Abs: 3 10*3/uL (ref 0.7–4.0)
MCH: 27.7 pg (ref 26.0–34.0)
MCHC: 32.5 g/dL (ref 30.0–36.0)
MCV: 85.5 fL (ref 80.0–100.0)
Monocytes Absolute: 0.7 10*3/uL (ref 0.1–1.0)
Monocytes Relative: 8 %
Neutro Abs: 4.8 10*3/uL (ref 1.7–7.7)
Neutrophils Relative %: 53 %
Platelets: 291 10*3/uL (ref 150–400)
RBC: 4.47 MIL/uL (ref 3.87–5.11)
RDW: 13.2 % (ref 11.5–15.5)
WBC: 9 10*3/uL (ref 4.0–10.5)
nRBC: 0 % (ref 0.0–0.2)

## 2019-08-04 MED ORDER — SODIUM CHLORIDE 0.9 % IV SOLN
600.0000 mg/m2 | Freq: Once | INTRAVENOUS | Status: AC
  Administered 2019-08-04: 11:00:00 1140 mg via INTRAVENOUS
  Filled 2019-08-04: qty 50

## 2019-08-04 MED ORDER — HEPARIN SOD (PORK) LOCK FLUSH 100 UNIT/ML IV SOLN
500.0000 [IU] | Freq: Once | INTRAVENOUS | Status: AC
  Administered 2019-08-04: 500 [IU] via INTRAVENOUS
  Filled 2019-08-04: qty 5

## 2019-08-04 MED ORDER — SODIUM CHLORIDE 0.9 % IV SOLN
150.0000 mg | Freq: Once | INTRAVENOUS | Status: AC
  Administered 2019-08-04: 150 mg via INTRAVENOUS
  Filled 2019-08-04: qty 150

## 2019-08-04 MED ORDER — SODIUM CHLORIDE 0.9 % IV SOLN
Freq: Once | INTRAVENOUS | Status: AC
  Filled 2019-08-04: qty 250

## 2019-08-04 MED ORDER — HEPARIN SOD (PORK) LOCK FLUSH 100 UNIT/ML IV SOLN
INTRAVENOUS | Status: AC
  Filled 2019-08-04: qty 5

## 2019-08-04 MED ORDER — PALONOSETRON HCL INJECTION 0.25 MG/5ML
0.2500 mg | Freq: Once | INTRAVENOUS | Status: AC
  Administered 2019-08-04: 10:00:00 0.25 mg via INTRAVENOUS
  Filled 2019-08-04: qty 5

## 2019-08-04 MED ORDER — DOXORUBICIN HCL CHEMO IV INJECTION 2 MG/ML
60.0000 mg/m2 | Freq: Once | INTRAVENOUS | Status: AC
  Administered 2019-08-04: 114 mg via INTRAVENOUS
  Filled 2019-08-04: qty 50

## 2019-08-04 MED ORDER — SODIUM CHLORIDE 0.9 % IV SOLN
10.0000 mg | Freq: Once | INTRAVENOUS | Status: AC
  Administered 2019-08-04: 10 mg via INTRAVENOUS
  Filled 2019-08-04: qty 10

## 2019-08-04 MED ORDER — SODIUM CHLORIDE 0.9% FLUSH
10.0000 mL | Freq: Once | INTRAVENOUS | Status: AC
  Administered 2019-08-04: 10 mL via INTRAVENOUS
  Filled 2019-08-04: qty 10

## 2019-08-04 NOTE — Progress Notes (Signed)
Patient does not offer any problems today.  

## 2019-08-05 ENCOUNTER — Inpatient Hospital Stay: Payer: Medicare HMO

## 2019-08-05 ENCOUNTER — Other Ambulatory Visit: Payer: Self-pay

## 2019-08-05 ENCOUNTER — Encounter: Payer: Self-pay | Admitting: *Deleted

## 2019-08-05 DIAGNOSIS — C50411 Malignant neoplasm of upper-outer quadrant of right female breast: Secondary | ICD-10-CM

## 2019-08-05 DIAGNOSIS — Z5111 Encounter for antineoplastic chemotherapy: Secondary | ICD-10-CM | POA: Diagnosis not present

## 2019-08-05 DIAGNOSIS — Z5189 Encounter for other specified aftercare: Secondary | ICD-10-CM | POA: Diagnosis not present

## 2019-08-05 DIAGNOSIS — E860 Dehydration: Secondary | ICD-10-CM | POA: Diagnosis not present

## 2019-08-05 DIAGNOSIS — Z171 Estrogen receptor negative status [ER-]: Secondary | ICD-10-CM | POA: Diagnosis not present

## 2019-08-05 DIAGNOSIS — D701 Agranulocytosis secondary to cancer chemotherapy: Secondary | ICD-10-CM | POA: Diagnosis not present

## 2019-08-05 DIAGNOSIS — Z79899 Other long term (current) drug therapy: Secondary | ICD-10-CM | POA: Diagnosis not present

## 2019-08-05 MED ORDER — PEGFILGRASTIM-JMDB 6 MG/0.6ML ~~LOC~~ SOSY
6.0000 mg | PREFILLED_SYRINGE | Freq: Once | SUBCUTANEOUS | Status: AC
  Administered 2019-08-05: 6 mg via SUBCUTANEOUS
  Filled 2019-08-05: qty 0.6

## 2019-08-05 NOTE — Progress Notes (Signed)
Called patient today.  She had her first chemo yesterday.  States "I feel good".  Encouraged her to call with any questions or needs.

## 2019-08-05 NOTE — Progress Notes (Signed)
Hematology/Oncology follow up note [facility] [phone] [phone]   Patient Care Team: Lavera Guise, MD as PCP - General (Internal Medicine) Rico Junker, RN as Registered Nurse Theodore Demark, RN as Registered Nurse  REFERRING PROVIDER: Lavera Guise, MD  CHIEF COMPLAINTS/REASON FOR VISIT:  Follow-up for breast cancer  HISTORY OF PRESENTING ILLNESS:   Dawn Alvarez is a  64 y.o.  female with PMH listed below was seen in consultation at the request of  Lavera Guise, MD  for evaluation of breast cancer Patient had screening mammogram done 08/12/2017 which showed right breast asymmetry.  A diagnostic mammogram was suggested and patient did not have it done. Patient felt her right breast mass for a few weeks.  She had bilateral diagnostic mammogram done on 06/30/2019. 2.8 x 2.4 x 2.6 cm right breast mass, 11:00, 10 cm from the nipple.  There is a single mildly abnormal node in the right axilla with a cortex measuring up to 4.4 mm.  No other suspicious findings. Patient underwent ultrasound-guided core biopsy of the right breast mass and right axilla lymph node Pathology showed invasive mammary carcinoma, no special type, grade 3, ER/PR HER-2 status are pending. Right axillary lymph node biopsy showed predominantly blood in the fibroadipose tissue, with scant lymphoid tissue present.  No definite malignancy was identified.  Patient was referred to cancer center to establish care and discuss treatment plan. Menarche 24 Postmenopausal.  LMP when she was 64 years old. She recalls use of birth control pills. Denies any hormone .  Replacement therapy. Denies any prior chest radiation. She reports family history of maternal grandmother and 2 maternal cousins were diagnosed with cancer.  She does not know about details.  INTERVAL HISTORY Dawn Alvarez is a 64 y.o. female who has above history reviewed by me today presents for follow up  visit for management of breast cancer, evaluation prior to chemotherapy. Problems and complaints are listed below: Patient was accompanied by daughter. Chemotherapy was held at her last week's clinical visit due to uncontrolled hypertension. Today blood pressure is better controlled.  She has no new complaints. Daughter has questions   Review of Systems  Constitutional: Negative for appetite change, chills, fatigue and fever.  HENT:   Negative for hearing loss and voice change.   Eyes: Negative for eye problems.  Respiratory: Negative for chest tightness and cough.   Cardiovascular: Negative for chest pain.  Gastrointestinal: Negative for abdominal distention, abdominal pain and blood in stool.  Endocrine: Negative for hot flashes.  Genitourinary: Negative for difficulty urinating and frequency.   Musculoskeletal: Negative for arthralgias.  Skin: Negative for itching and rash.  Neurological: Negative for extremity weakness.  Hematological: Negative for adenopathy.  Psychiatric/Behavioral: Negative for confusion.    MEDICAL HISTORY:  Past Medical History:  Diagnosis Date  . Asthma   . COPD exacerbation ([place]) 04/12/2016  . Diabetes mellitus without complication ([place])   . Hyperlipemia   . Hypertension     SURGICAL HISTORY: Past Surgical History:  Procedure Laterality Date  . BREAST BIOPSY Right 07/05/2019   [place] bx venus marker, path pending  . BREAST BIOPSY Right 07/05/2019   LN bx, hydromarker, path pending  . CESAREAN SECTION    . COLONOSCOPY WITH PROPOFOL N/A 09/11/2015   Procedure: COLONOSCOPY WITH PROPOFOL;  Surgeon: Lucilla Lame, MD;  Location: [facility] ENDOSCOPY;  Service: Endoscopy;  Laterality: N/A;  . IR IMAGING GUIDED PORT INSERTION  07/20/2019    SOCIAL  HISTORY: Social History   Socioeconomic History  . Marital status: Married    Spouse name: Not on file  . Number of children: Not on file  . Years of education: Not on file  . Highest education level: Not on  file  Occupational History  . Not on file  Tobacco Use  . Smoking status: Former Smoker    Packs/day: 1.00    Years: 2.00    Pack years: 2.00    Types: Cigarettes    Quit date: 05/19/2019    Years since quitting: 0.2  . Smokeless tobacco: Never Used  Substance and Sexual Activity  . Alcohol use: Yes    Alcohol/week: 2.0 standard drinks    Types: 2 Cans of beer per week    Comment: ocassionally   . Drug use: No  . Sexual activity: Never    Birth control/protection: Abstinence  Other Topics Concern  . Not on file  Social History Narrative  . Not on file   Social Determinants of Health   Financial Resource Strain:   . Difficulty of Paying Living Expenses: Not on file  Food Insecurity:   . Worried About Charity fundraiser in the Last Year: Not on file  . Ran Out of Food in the Last Year: Not on file  Transportation Needs:   . Lack of Transportation (Medical): Not on file  . Lack of Transportation (Non-Medical): Not on file  Physical Activity:   . Days of Exercise per Week: Not on file  . Minutes of Exercise per Session: Not on file  Stress:   . Feeling of Stress : Not on file  Social Connections:   . Frequency of Communication with Friends and Family: Not on file  . Frequency of Social Gatherings with Friends and Family: Not on file  . Attends Religious Services: Not on file  . Active Member of Clubs or Organizations: Not on file  . Attends Archivist Meetings: Not on file  . Marital Status: Not on file  Intimate Partner Violence:   . Fear of Current or Ex-Partner: Not on file  . Emotionally Abused: Not on file  . Physically Abused: Not on file  . Sexually Abused: Not on file    FAMILY HISTORY: Family History  Problem Relation Age of Onset  . Diabetes Mother   . Hypertension Mother   . Hypertension Father   . Diabetes Father     ALLERGIES:  has No Known Allergies.  MEDICATIONS:  Current Outpatient Medications  Medication Sig Dispense Refill    . amLODipine (NORVASC) 10 MG tablet Take 1 tablet (10 mg total) by mouth daily. 30 tablet 5  . bisoprolol-hydrochlorothiazide (ZIAC) 10-6.25 MG tablet Take 1 tablet by mouth daily. 30 tablet 2  . glucose blood (ACCU-CHEK SMARTVIEW) test strip 1 each by Other route as needed for other. Use as directed twice a day diag E11.65    . hydrALAZINE (APRESOLINE) 50 MG tablet Take 1 tablet (50 mg total) by mouth 3 (three) times daily. 90 tablet 3  . insulin detemir (LEVEMIR) 100 UNIT/ML injection Inject 0.4 mLs (40 Units total) into the skin daily. 10 mL 5  . Insulin Pen Needle (PEN NEEDLES) 31G X 8 MM MISC Use as directed with insulin E11.65 100 each 1  . Lancets Misc. (ACCU-CHEK MULTICLIX LANCET DEV) KIT by Does not apply route. Use as directed twice a day diag E11.65    . lisinopril (ZESTRIL) 20 MG tablet Take 1 tablet (20 mg total)  by mouth daily. 30 tablet 3  . Omega-3 Fatty Acids (FISH OIL) 1000 MG CAPS Take 1,000 mg by mouth daily.     . Semaglutide, 1 MG/DOSE, (OZEMPIC, 1 MG/DOSE,) 2 MG/1.5ML SOPN Inject 1 mg into the skin once a week. 5 pen 3   No current facility-administered medications for this visit.     PHYSICAL EXAMINATION: ECOG PERFORMANCE STATUS: 0 - Asymptomatic Vitals:   08/04/19 0833  BP: (!) 149/74  Pulse: 63  Resp: 16  Temp: (!) 96.2 F (35.7 C)   Filed Weights   08/04/19 0833  Weight: 175 lb (79.4 kg)    Physical Exam Constitutional:      General: She is not in acute distress. HENT:     Head: Normocephalic and atraumatic.  Eyes:     General: No scleral icterus. Cardiovascular:     Rate and Rhythm: Normal rate and regular rhythm.     Heart sounds: Normal heart sounds.  Pulmonary:     Effort: Pulmonary effort is normal. No respiratory distress.     Breath sounds: No wheezing.  Abdominal:     General: Bowel sounds are normal. There is no distension.     Palpations: Abdomen is soft.  Musculoskeletal:        General: No deformity. Normal range of motion.      Cervical back: Normal range of motion and neck supple.  Skin:    General: Skin is warm and dry.     Findings: No erythema or rash.  Neurological:     Mental Status: She is alert and oriented to person, place, and time. Mental status is at baseline.     Cranial Nerves: No cranial nerve deficit.     Coordination: Coordination normal.  Psychiatric:        Mood and Affect: Mood normal.       LABORATORY DATA:  I have reviewed the data as listed Lab Results  Component Value Date   WBC 9.0 08/04/2019   HGB 12.4 08/04/2019   HCT 38.2 08/04/2019   MCV 85.5 08/04/2019   PLT 291 08/04/2019   Recent Labs    07/27/19 0843 07/30/19 1409 08/04/19 0806  NA 137 137 137  K 3.6 4.1 3.9  CL 103 103 104  CO2 _0 GLUCOSE 180* 122* 167*  BUN 13 19 29*  CREATININE 0.80 0.93 1.21*  CALCIUM 9.2 10.2 9.6  GFRNONAA >60 >60 48*  GFRAA >60 >60 55*  PROT 7.7 8.3* 7.9  ALBUMIN 3.9 4.2 4.0  AST _1 ALT _2 ALKPHOS 38 38 35*  BILITOT 0.5 0.7 0.6   Iron/TIBC/Ferritin/ %Sat No results found for: IRON, TIBC, FERRITIN, IRONPCTSAT    RADIOGRAPHIC STUDIES: I have personally reviewed the radiological images as listed and agreed with the findings in the report.  CT Head Wo Contrast  Result Date: 07/30/2019 CLINICAL DATA:  64 year old female with altered mental status EXAM: CT HEAD WITHOUT CONTRAST TECHNIQUE: Contiguous axial images were obtained from the base of the skull through the vertex without intravenous contrast. COMPARISON:  MR 05/04/2010, CT 12/28/2018 FINDINGS: Brain: No acute intracranial hemorrhage. No midline shift or mass effect. Gray-white differentiation maintained. Confluent hypodensity in the periventricular white matter, similar to the prior MR and CT. Unremarkable appearance of the ventricular system. Vascular: Mild atherosclerotic calcifications. Skull: No acute fracture.  No aggressive bone lesion identified. Sinuses/Orbits: Unremarkable appearance of the orbits.  Mastoid air cells clear. No middle ear effusion. No significant  sinus disease. Other: None IMPRESSION: Negative for acute intracranial abnormality. Chronic microvascular ischemic disease. Electronically Signed   By: Corrie Mckusick D.O.   On: 07/30/2019 14:46   NM Cardiac Muga Rest  Result Date: 07/25/2019 CLINICAL DATA:  Breast cancer. Evaluate cardiac function in relation to chemotherapy. EXAM: NUCLEAR MEDICINE CARDIAC BLOOD POOL IMAGING (MUGA) TECHNIQUE: Cardiac multi-gated acquisition was performed at rest following intravenous injection of Tc-58mlabeled red blood cells. RADIOPHARMACEUTICALS:  22.8 mCi Tc-937mertechnetate in-vitro labeled red blood cells IV COMPARISON:  None FINDINGS: No  focal wall motion abnormality of the left ventricle. Calculated left ventricular ejection fraction equals 74 % IMPRESSION: Left ventricular ejection fraction equals74 %. Electronically Signed   By: StSuzy Bouchard.D.   On: 07/25/2019 15:16   IR IMAGING GUIDED PORT INSERTION  Result Date: 07/20/2019 CLINICAL DATA:  Right breast carcinoma and need for porta cath for chemotherapy. EXAM: IMPLANTED PORT A CATH PLACEMENT WITH ULTRASOUND AND FLUOROSCOPIC GUIDANCE ANESTHESIA/SEDATION: 1.0 mg IV Versed; 50 mcg IV Fentanyl Total Moderate Sedation Time:  28 minutes The patient's level of consciousness and physiologic status were continuously monitored during the procedure by Radiology nursing. Additional Medications: 2 g IV Ancef. FLUOROSCOPY TIME:  30 seconds. PROCEDURE: The procedure, risks, benefits, and alternatives were explained to the patient. Questions regarding the procedure were encouraged and answered. The patient understands and consents to the procedure. A time-out was performed prior to initiating the procedure. Ultrasound was utilized to confirm patency of the left internal jugular vein. The left neck and chest were prepped with chlorhexidine in a sterile fashion, and a sterile drape was applied covering the  operative field. Maximum barrier sterile technique with sterile gowns and gloves were used for the procedure. Local anesthesia was provided with 1% lidocaine. After creating a small venotomy incision, a 21 gauge needle was advanced into the left internal jugular vein under direct, real-time ultrasound guidance. Ultrasound image documentation was performed. After securing guidewire access, an 8 Fr dilator was placed. A J-wire was kinked to measure appropriate catheter length. A subcutaneous port pocket was then created along the upper chest wall utilizing sharp and blunt dissection. Portable cautery was utilized. The pocket was irrigated with sterile saline. A single lumen power injectable port was chosen for placement. The 8 Fr catheter was tunneled from the port pocket site to the venotomy incision. The port was placed in the pocket. External catheter was trimmed to appropriate length based on guidewire measurement. At the venotomy, an 8 Fr peel-away sheath was placed over a guidewire. The catheter was then placed through the sheath and the sheath removed. Final catheter positioning was confirmed and documented with a fluoroscopic spot image. The port was accessed with a needle and aspirated and flushed with heparinized saline. The access needle was removed. The venotomy and port pocket incisions were closed with subcutaneous 3-0 Monocryl and subcuticular 4-0 Vicryl. Dermabond was applied to both incisions. COMPLICATIONS: COMPLICATIONS None FINDINGS: After catheter placement, the tip lies at the cavo-atrial junction. The catheter aspirates normally and is ready for immediate use. IMPRESSION: Placement of single lumen port a cath via left internal jugular vein. The catheter tip lies at the cavo-atrial junction. A power injectable port a cath was placed and is ready for immediate use. Electronically Signed   By: GlAletta Edouard.D.   On: 07/20/2019 16:28      ASSESSMENT & PLAN:  1. Malignant neoplasm of  upper-outer quadrant of right breast in female, estrogen receptor negative [place]  2.  Dehydration   3. Encounter for antineoplastic chemotherapy   4. Uncontrolled type 2 diabetes mellitus with hyperglycemia (HCC)    cT2N0 grade 3 invasive mammary carcinoma.  ER negative, PR weakly positive (<=10%), HER-2 negative Daughter has multiple questions about patient's pathology, treatment plan.  Discussed with both patient and daughter that weekly hormone receptor positive breast cancer has similar outcome with triple negative breast cancer and will be treated with similar way of treating triple negative breast cancer. I explained to the patient and her daughter about the risks and benefits of chemotherapy including all but not limited to infusion reaction, hair loss, hearing loss, mouth sore, nausea, vomiting, low blood counts, bleeding, heart failure, kidney failure and risk of life threatening infection and even death, secondary malignancy etc. I also discussed about the instructions of antiemetics. Patient and daughter agree with proceeding with treatment today.  Patient declines scalp cooling system.  Labs reviewed and discussed with patient.  Counts are acceptable to proceed with cycle 1 DD AC.  Patient will have growth factor support. Growth factor-Fulphila will be given as chemotherapy-induced neutropenia to prevent febrile neutropenias. Discussed potential side effect- including but not limited to myalgias/arthralgias- {recommend Claritin for 4 days), hypersensitivity, injection site reactions, inflammation, bleeding, splenic rupture, etc .  #Elevated creatinine, 1.2, patient admits not drinking much fluid recently.  Encourage oral hydration. She will receive IV fluid today with chemotherapy. Repeat chemistry next week.  #Diabetes, recent A1c on 06/20/2019 was 8.2.  Poorly controlled.  I discussed with patient about lifestyle modification, compliance with diabetes medications and possibilities of  worsening of her glucose level while on chemotherapy/use of steroids.  All questions were answered. The patient knows to call the clinic with any problems questions or concerns.  A total of 40 minutes was spent on this visit, with 15 minutes spent discussing with patient and daughter about image findings, pathology reports. Additional 25 minutes were spent on discussing chemotherapy composition, duration of treatments, potential side effects, instructions of using antiemetics.  Return of visit:  1 week for re-evaluation.    Earlie Server, MD, PhD Hematology Oncology [facility] at [facility] Pager- [phone] 08/05/2019

## 2019-08-11 ENCOUNTER — Inpatient Hospital Stay: Payer: Medicare HMO

## 2019-08-11 ENCOUNTER — Encounter: Payer: Self-pay | Admitting: Oncology

## 2019-08-11 ENCOUNTER — Inpatient Hospital Stay (HOSPITAL_BASED_OUTPATIENT_CLINIC_OR_DEPARTMENT_OTHER): Payer: Medicare HMO | Admitting: Oncology

## 2019-08-11 VITALS — BP 131/73 | HR 69 | Temp 95.3°F | Resp 18 | Wt 177.5 lb

## 2019-08-11 DIAGNOSIS — Z171 Estrogen receptor negative status [ER-]: Secondary | ICD-10-CM | POA: Diagnosis not present

## 2019-08-11 DIAGNOSIS — Z5189 Encounter for other specified aftercare: Secondary | ICD-10-CM | POA: Diagnosis not present

## 2019-08-11 DIAGNOSIS — D701 Agranulocytosis secondary to cancer chemotherapy: Secondary | ICD-10-CM | POA: Diagnosis not present

## 2019-08-11 DIAGNOSIS — E1165 Type 2 diabetes mellitus with hyperglycemia: Secondary | ICD-10-CM | POA: Diagnosis not present

## 2019-08-11 DIAGNOSIS — C50411 Malignant neoplasm of upper-outer quadrant of right female breast: Secondary | ICD-10-CM

## 2019-08-11 DIAGNOSIS — E871 Hypo-osmolality and hyponatremia: Secondary | ICD-10-CM | POA: Diagnosis not present

## 2019-08-11 DIAGNOSIS — Z5111 Encounter for antineoplastic chemotherapy: Secondary | ICD-10-CM | POA: Diagnosis not present

## 2019-08-11 DIAGNOSIS — D702 Other drug-induced agranulocytosis: Secondary | ICD-10-CM | POA: Diagnosis not present

## 2019-08-11 DIAGNOSIS — E860 Dehydration: Secondary | ICD-10-CM | POA: Diagnosis not present

## 2019-08-11 DIAGNOSIS — Z79899 Other long term (current) drug therapy: Secondary | ICD-10-CM | POA: Diagnosis not present

## 2019-08-11 LAB — CBC WITH DIFFERENTIAL/PLATELET
Abs Immature Granulocytes: 0.08 10*3/uL — ABNORMAL HIGH (ref 0.00–0.07)
Basophils Absolute: 0.1 10*3/uL (ref 0.0–0.1)
Basophils Relative: 2 %
Eosinophils Absolute: 0.1 10*3/uL (ref 0.0–0.5)
Eosinophils Relative: 4 %
HCT: 34.8 % — ABNORMAL LOW (ref 36.0–46.0)
Hemoglobin: 11.4 g/dL — ABNORMAL LOW (ref 12.0–15.0)
Immature Granulocytes: 3 %
Lymphocytes Relative: 45 %
Lymphs Abs: 1.4 10*3/uL (ref 0.7–4.0)
MCH: 27.5 pg (ref 26.0–34.0)
MCHC: 32.8 g/dL (ref 30.0–36.0)
MCV: 84.1 fL (ref 80.0–100.0)
Monocytes Absolute: 0.2 10*3/uL (ref 0.1–1.0)
Monocytes Relative: 8 %
Neutro Abs: 1.2 10*3/uL — ABNORMAL LOW (ref 1.7–7.7)
Neutrophils Relative %: 38 %
Platelets: 173 10*3/uL (ref 150–400)
RBC: 4.14 MIL/uL (ref 3.87–5.11)
RDW: 12.5 % (ref 11.5–15.5)
Smear Review: NORMAL
WBC: 3.2 10*3/uL — ABNORMAL LOW (ref 4.0–10.5)
nRBC: 0 % (ref 0.0–0.2)

## 2019-08-11 LAB — COMPREHENSIVE METABOLIC PANEL
ALT: 18 U/L (ref 0–44)
AST: 18 U/L (ref 15–41)
Albumin: 3.6 g/dL (ref 3.5–5.0)
Alkaline Phosphatase: 56 U/L (ref 38–126)
Anion gap: 10 (ref 5–15)
BUN: 17 mg/dL (ref 8–23)
CO2: 23 mmol/L (ref 22–32)
Calcium: 9 mg/dL (ref 8.9–10.3)
Chloride: 100 mmol/L (ref 98–111)
Creatinine, Ser: 0.92 mg/dL (ref 0.44–1.00)
GFR calc Af Amer: >60 mL/min (ref >60)
GFR calc non Af Amer: >60 mL/min (ref >60)
Glucose, Bld: 244 mg/dL — ABNORMAL HIGH (ref 70–99)
Potassium: 3.7 mmol/L (ref 3.5–5.1)
Sodium: 133 mmol/L — ABNORMAL LOW (ref 135–145)
Total Bilirubin: 0.2 mg/dL — ABNORMAL LOW (ref 0.3–1.2)
Total Protein: 7 g/dL (ref 6.5–8.1)

## 2019-08-11 MED ORDER — HEPARIN SOD (PORK) LOCK FLUSH 100 UNIT/ML IV SOLN
500.0000 [IU] | Freq: Once | INTRAVENOUS | Status: AC
  Administered 2019-08-11: 500 [IU] via INTRAVENOUS
  Filled 2019-08-11: qty 5

## 2019-08-11 MED ORDER — SODIUM CHLORIDE 0.9 % IV SOLN
Freq: Once | INTRAVENOUS | Status: AC
  Filled 2019-08-11: qty 250

## 2019-08-11 MED ORDER — SODIUM CHLORIDE 0.9% FLUSH
10.0000 mL | Freq: Once | INTRAVENOUS | Status: DC
  Filled 2019-08-11: qty 10

## 2019-08-11 MED ORDER — HEPARIN SOD (PORK) LOCK FLUSH 100 UNIT/ML IV SOLN
INTRAVENOUS | Status: AC
  Filled 2019-08-11: qty 5

## 2019-08-11 NOTE — Progress Notes (Signed)
Patient here for follow up. Pt reports appetite has decreased since she started on treatment.

## 2019-08-12 NOTE — Progress Notes (Signed)
Hematology/Oncology follow up note [facility] Telephone:([phone] Fax:([phone]) [phone]   Patient Care Team: Lavera Guise, MD as PCP - General (Internal Medicine) Rico Junker, RN as Registered Nurse Theodore Demark, RN as Registered Nurse  REFERRING PROVIDER: Lavera Guise, MD  CHIEF COMPLAINTS/REASON FOR VISIT:  Follow-up for breast cancer  HISTORY OF PRESENTING ILLNESS:   Dawn Alvarez is a  64 y.o.  female with PMH listed below was seen in consultation at the request of  Lavera Guise, MD  for evaluation of breast cancer Patient had screening mammogram done 08/12/2017 which showed right breast asymmetry.  A diagnostic mammogram was suggested and patient did not have it done. Patient felt her right breast mass for a few weeks.  She had bilateral diagnostic mammogram done on 06/30/2019. 2.8 x 2.4 x 2.6 cm right breast mass, 11:00, 10 cm from the nipple.  There is a single mildly abnormal node in the right axilla with a cortex measuring up to 4.4 mm.  No other suspicious findings. Patient underwent ultrasound-guided core biopsy of the right breast mass and right axilla lymph node Pathology showed invasive mammary carcinoma, no special type, grade 3, ER/PR HER-2 status are pending. Right axillary lymph node biopsy showed predominantly blood in the fibroadipose tissue, with scant lymphoid tissue present.  No definite malignancy was identified.  Patient was referred to cancer center to establish care and discuss treatment plan. Menarche 72 Postmenopausal.  LMP when she was 64 years old. She recalls use of birth control pills. Denies any hormone .  Replacement therapy. Denies any prior chest radiation. She reports family history of maternal grandmother and 2 maternal cousins were diagnosed with cancer.  She does not know about details.  INTERVAL HISTORY Dawn Alvarez is a 64 y.o. female who has above history reviewed by me today presents for follow up  visit for management of breast cancer, evaluation prior to chemotherapy. Problems and complaints are listed below: Patient was accompanied by daughter. Status post cycle 1 DD AC 1 week ago. She tolerates well.   Appetite is decreased.  Denies nausea, vomiting, diarrhea.   Review of Systems  Constitutional: Positive for appetite change. Negative for chills, fatigue and fever.  HENT:   Negative for hearing loss and voice change.   Eyes: Negative for eye problems.  Respiratory: Negative for chest tightness and cough.   Cardiovascular: Negative for chest pain.  Gastrointestinal: Negative for abdominal distention, abdominal pain and blood in stool.  Endocrine: Negative for hot flashes.  Genitourinary: Negative for difficulty urinating and frequency.   Musculoskeletal: Negative for arthralgias.  Skin: Negative for itching and rash.  Neurological: Negative for extremity weakness.  Hematological: Negative for adenopathy.  Psychiatric/Behavioral: Negative for confusion.    MEDICAL HISTORY:  Past Medical History:  Diagnosis Date  . Asthma   . COPD exacerbation ([place]) 04/12/2016  . Diabetes mellitus without complication ([place])   . Hyperlipemia   . Hypertension     SURGICAL HISTORY: Past Surgical History:  Procedure Laterality Date  . BREAST BIOPSY Right 07/05/2019   [place] bx venus marker, path pending  . BREAST BIOPSY Right 07/05/2019   LN bx, hydromarker, path pending  . CESAREAN SECTION    . COLONOSCOPY WITH PROPOFOL N/A 09/11/2015   Procedure: COLONOSCOPY WITH PROPOFOL;  Surgeon: Lucilla Lame, MD;  Location: [facility] ENDOSCOPY;  Service: Endoscopy;  Laterality: N/A;  . IR IMAGING GUIDED PORT INSERTION  07/20/2019    SOCIAL HISTORY: Social History  Socioeconomic History  . Marital status: Married    Spouse name: Not on file  . Number of children: Not on file  . Years of education: Not on file  . Highest education level: Not on file  Occupational History  . Not on file  Tobacco  Use  . Smoking status: Former Smoker    Packs/day: 1.00    Years: 2.00    Pack years: 2.00    Types: Cigarettes    Quit date: 05/19/2019    Years since quitting: 0.2  . Smokeless tobacco: Never Used  Substance and Sexual Activity  . Alcohol use: Yes    Alcohol/week: 2.0 standard drinks    Types: 2 Cans of beer per week    Comment: ocassionally   . Drug use: No  . Sexual activity: Never    Birth control/protection: Abstinence  Other Topics Concern  . Not on file  Social History Narrative  . Not on file   Social Determinants of Health   Financial Resource Strain:   . Difficulty of Paying Living Expenses:   Food Insecurity:   . Worried About Charity fundraiser in the Last Year:   . Arboriculturist in the Last Year:   Transportation Needs:   . Film/video editor (Medical):   Marland Kitchen Lack of Transportation (Non-Medical):   Physical Activity:   . Days of Exercise per Week:   . Minutes of Exercise per Session:   Stress:   . Feeling of Stress :   Social Connections:   . Frequency of Communication with Friends and Family:   . Frequency of Social Gatherings with Friends and Family:   . Attends Religious Services:   . Active Member of Clubs or Organizations:   . Attends Archivist Meetings:   Marland Kitchen Marital Status:   Intimate Partner Violence:   . Fear of Current or Ex-Partner:   . Emotionally Abused:   Marland Kitchen Physically Abused:   . Sexually Abused:     FAMILY HISTORY: Family History  Problem Relation Age of Onset  . Diabetes Mother   . Hypertension Mother   . Hypertension Father   . Diabetes Father     ALLERGIES:  has No Known Allergies.  MEDICATIONS:  Current Outpatient Medications  Medication Sig Dispense Refill  . amLODipine (NORVASC) 10 MG tablet Take 1 tablet (10 mg total) by mouth daily. 30 tablet 5  . bisoprolol-hydrochlorothiazide (ZIAC) 10-6.25 MG tablet Take 1 tablet by mouth daily. 30 tablet 2  . glucose blood (ACCU-CHEK SMARTVIEW) test strip 1 each  by Other route as needed for other. Use as directed twice a day diag E11.65    . hydrALAZINE (APRESOLINE) 50 MG tablet Take 1 tablet (50 mg total) by mouth 3 (three) times daily. 90 tablet 3  . insulin detemir (LEVEMIR) 100 UNIT/ML injection Inject 0.4 mLs (40 Units total) into the skin daily. 10 mL 5  . Insulin Pen Needle (PEN NEEDLES) 31G X 8 MM MISC Use as directed with insulin E11.65 100 each 1  . Lancets Misc. (ACCU-CHEK MULTICLIX LANCET DEV) KIT by Does not apply route. Use as directed twice a day diag E11.65    . lisinopril (ZESTRIL) 20 MG tablet Take 1 tablet (20 mg total) by mouth daily. 30 tablet 3  . Omega-3 Fatty Acids (FISH OIL) 1000 MG CAPS Take 1,000 mg by mouth daily.     . Semaglutide, 1 MG/DOSE, (OZEMPIC, 1 MG/DOSE,) 2 MG/1.5ML SOPN Inject 1 mg into the skin  once a week. 5 pen 3   No current facility-administered medications for this visit.     PHYSICAL EXAMINATION: ECOG PERFORMANCE STATUS: 0 - Asymptomatic Vitals:   08/11/19 1100  BP: 131/73  Pulse: 69  Resp: 18  Temp: (!) 95.3 F (35.2 C)   Filed Weights   08/11/19 1100  Weight: 177 lb 8 oz (80.5 kg)    Physical Exam Constitutional:      General: She is not in acute distress. HENT:     Head: Normocephalic and atraumatic.  Eyes:     General: No scleral icterus. Cardiovascular:     Rate and Rhythm: Normal rate and regular rhythm.     Heart sounds: Normal heart sounds.  Pulmonary:     Effort: Pulmonary effort is normal. No respiratory distress.     Breath sounds: No wheezing.  Abdominal:     General: Bowel sounds are normal. There is no distension.     Palpations: Abdomen is soft.  Musculoskeletal:        General: No deformity. Normal range of motion.     Cervical back: Normal range of motion and neck supple.  Skin:    General: Skin is warm and dry.     Findings: No erythema or rash.  Neurological:     Mental Status: She is alert and oriented to person, place, and time. Mental status is at baseline.      Cranial Nerves: No cranial nerve deficit.     Coordination: Coordination normal.  Psychiatric:        Mood and Affect: Mood normal.       LABORATORY DATA:  I have reviewed the data as listed Lab Results  Component Value Date   WBC 3.2 (L) 08/11/2019   HGB 11.4 (L) 08/11/2019   HCT 34.8 (L) 08/11/2019   MCV 84.1 08/11/2019   PLT 173 08/11/2019   Recent Labs    07/30/19 1409 08/04/19 0806 08/11/19 1046  NA 137 137 133*  K 4.1 3.9 3.7  CL 103 104 100  CO2 '26 22 23  ' GLUCOSE 122* 167* 244*  BUN 19 29* 17  CREATININE 0.93 1.21* 0.92  CALCIUM 10.2 9.6 9.0  GFRNONAA >60 48* >60  GFRAA >60 55* >60  PROT 8.3* 7.9 7.0  ALBUMIN 4.2 4.0 3.6  AST '19 18 18  ' ALT '15 14 18  ' ALKPHOS 38 35* 56  BILITOT 0.7 0.6 0.2*   Iron/TIBC/Ferritin/ %Sat No results found for: IRON, TIBC, FERRITIN, IRONPCTSAT    RADIOGRAPHIC STUDIES: I have personally reviewed the radiological images as listed and agreed with the findings in the report.  CT Head Wo Contrast  Result Date: 07/30/2019 CLINICAL DATA:  64 year old female with altered mental status EXAM: CT HEAD WITHOUT CONTRAST TECHNIQUE: Contiguous axial images were obtained from the base of the skull through the vertex without intravenous contrast. COMPARISON:  MR 05/04/2010, CT 12/28/2018 FINDINGS: Brain: No acute intracranial hemorrhage. No midline shift or mass effect. Gray-white differentiation maintained. Confluent hypodensity in the periventricular white matter, similar to the prior MR and CT. Unremarkable appearance of the ventricular system. Vascular: Mild atherosclerotic calcifications. Skull: No acute fracture.  No aggressive bone lesion identified. Sinuses/Orbits: Unremarkable appearance of the orbits. Mastoid air cells clear. No middle ear effusion. No significant sinus disease. Other: None IMPRESSION: Negative for acute intracranial abnormality. Chronic microvascular ischemic disease. Electronically Signed   By: Corrie Mckusick D.O.    On: 07/30/2019 14:46   NM Cardiac Muga Rest  Result Date: 07/25/2019  CLINICAL DATA:  Breast cancer. Evaluate cardiac function in relation to chemotherapy. EXAM: NUCLEAR MEDICINE CARDIAC BLOOD POOL IMAGING (MUGA) TECHNIQUE: Cardiac multi-gated acquisition was performed at rest following intravenous injection of Tc-53mlabeled red blood cells. RADIOPHARMACEUTICALS:  22.8 mCi Tc-968mertechnetate in-vitro labeled red blood cells IV COMPARISON:  None FINDINGS: No  focal wall motion abnormality of the left ventricle. Calculated left ventricular ejection fraction equals 74 % IMPRESSION: Left ventricular ejection fraction equals74 %. Electronically Signed   By: StSuzy Bouchard.D.   On: 07/25/2019 15:16   IR IMAGING GUIDED PORT INSERTION  Result Date: 07/20/2019 CLINICAL DATA:  Right breast carcinoma and need for porta cath for chemotherapy. EXAM: IMPLANTED PORT A CATH PLACEMENT WITH ULTRASOUND AND FLUOROSCOPIC GUIDANCE ANESTHESIA/SEDATION: 1.0 mg IV Versed; 50 mcg IV Fentanyl Total Moderate Sedation Time:  28 minutes The patient's level of consciousness and physiologic status were continuously monitored during the procedure by Radiology nursing. Additional Medications: 2 g IV Ancef. FLUOROSCOPY TIME:  30 seconds. PROCEDURE: The procedure, risks, benefits, and alternatives were explained to the patient. Questions regarding the procedure were encouraged and answered. The patient understands and consents to the procedure. A time-out was performed prior to initiating the procedure. Ultrasound was utilized to confirm patency of the left internal jugular vein. The left neck and chest were prepped with chlorhexidine in a sterile fashion, and a sterile drape was applied covering the operative field. Maximum barrier sterile technique with sterile gowns and gloves were used for the procedure. Local anesthesia was provided with 1% lidocaine. After creating a small venotomy incision, a 21 gauge needle was advanced into the  left internal jugular vein under direct, real-time ultrasound guidance. Ultrasound image documentation was performed. After securing guidewire access, an 8 Fr dilator was placed. A J-wire was kinked to measure appropriate catheter length. A subcutaneous port pocket was then created along the upper chest wall utilizing sharp and blunt dissection. Portable cautery was utilized. The pocket was irrigated with sterile saline. A single lumen power injectable port was chosen for placement. The 8 Fr catheter was tunneled from the port pocket site to the venotomy incision. The port was placed in the pocket. External catheter was trimmed to appropriate length based on guidewire measurement. At the venotomy, an 8 Fr peel-away sheath was placed over a guidewire. The catheter was then placed through the sheath and the sheath removed. Final catheter positioning was confirmed and documented with a fluoroscopic spot image. The port was accessed with a needle and aspirated and flushed with heparinized saline. The access needle was removed. The venotomy and port pocket incisions were closed with subcutaneous 3-0 Monocryl and subcuticular 4-0 Vicryl. Dermabond was applied to both incisions. COMPLICATIONS: COMPLICATIONS None FINDINGS: After catheter placement, the tip lies at the cavo-atrial junction. The catheter aspirates normally and is ready for immediate use. IMPRESSION: Placement of single lumen port a cath via left internal jugular vein. The catheter tip lies at the cavo-atrial junction. A power injectable port a cath was placed and is ready for immediate use. Electronically Signed   By: GlAletta Edouard.D.   On: 07/20/2019 16:28      ASSESSMENT & PLAN:  1. Malignant neoplasm of upper-outer quadrant of right breast in female, estrogen receptor negative ([facility]  2. Hyponatremia   3. Other drug-induced neutropenia ([place]  4. Uncontrolled type 2 diabetes mellitus with hyperglycemia (HCC)    cT2N0 grade 3 invasive mammary  carcinoma.  ER negative, PR weakly positive (<=10%), HER-2 negative Status  post 1 cycle of ddAC with growth factor support. Tolerates the regimen so far. Labs are reviewed and discussed with patient.  #Hyponatremia likely due to decreased oral intake.  Patient will receive IV normal saline for hydration today.  #Uncontrolled diabetes, recent A1c on 06/20/2019 was 8.2.  Patient is on Levemir, semaglutide.  Was #Neutropenia, secondary to chemotherapy.  ANC 1.2.  Continue.  All questions were answered. The patient knows to call the clinic with any problems questions or concerns.   Return of visit:  1 week   Earlie Server, MD, PhD Hematology Oncology [facility] Pager- [phone] 08/12/2019

## 2019-08-15 DIAGNOSIS — H40003 Preglaucoma, unspecified, bilateral: Secondary | ICD-10-CM | POA: Diagnosis not present

## 2019-08-17 ENCOUNTER — Inpatient Hospital Stay: Payer: Medicare HMO | Attending: Genetic Counselor | Admitting: Genetic Counselor

## 2019-08-17 DIAGNOSIS — Z171 Estrogen receptor negative status [ER-]: Secondary | ICD-10-CM | POA: Diagnosis not present

## 2019-08-17 DIAGNOSIS — C50411 Malignant neoplasm of upper-outer quadrant of right female breast: Secondary | ICD-10-CM | POA: Diagnosis not present

## 2019-08-17 NOTE — Progress Notes (Signed)
REFERRING PROVIDER: Earlie Server, MD [name],  [place] [id number]  PRIMARY PROVIDER:  Lavera Guise, MD  PRIMARY REASON FOR VISIT:  1. Malignant neoplasm of upper-outer quadrant of right breast in female, estrogen receptor negative ([place])      HISTORY OF PRESENT ILLNESS:  I connected with  Dawn Alvarez on 08/17/2019 at 2 PM EDT by MyChart video conference and verified that I am speaking with the correct person using two identifiers.   Patient location: Home Provider location: [facility]   Dawn Alvarez, a 64 y.o. female, was seen for a [facility] cancer genetics consultation at the request of Dr. Tasia Catchings due to a personal and family history of cancer.  Dawn Alvarez presents to clinic today to discuss the possibility of a hereditary predisposition to cancer, genetic testing, and to further clarify her future cancer risks, as well as potential cancer risks for family members.   In January 2021, at the age of 50, Dawn Alvarez was diagnosed with ER neg cancer of the right breast.    CANCER HISTORY:  Oncology History  Malignant neoplasm of upper-outer quadrant of right female breast ([place])  07/08/2019 Initial Diagnosis   Malignant neoplasm of upper-outer quadrant of right female breast ([place])   08/04/2019 Cancer Staging   Staging form: Breast, AJCC 8th Edition - Clinical: Stage IIB (cT2, cN0, cM0, G3, ER-, PR-, HER2-) - Signed by Earlie Server, MD on 08/04/2019   08/04/2019 -  Chemotherapy   The patient had DOXOrubicin (ADRIAMYCIN) chemo injection 114 mg, 60 mg/m2 = 114 mg, Intravenous,  Once, 1 of 4 cycles Administration: 114 mg (08/04/2019) palonosetron (ALOXI) injection 0.25 mg, 0.25 mg, Intravenous,  Once, 1 of 8 cycles Administration: 0.25 mg (08/04/2019) pegfilgrastim-jmdb (FULPHILA) injection 6 mg, 6 mg, Subcutaneous,  Once, 1 of 4 cycles Administration: 6 mg (08/05/2019) CARBOplatin (PARAPLATIN) in sodium chloride 0.9 % 100 mL chemo infusion, , Intravenous,  Once, 0 of 4 cycles cyclophosphamide  (CYTOXAN) 1,140 mg in sodium chloride 0.9 % 250 mL chemo infusion, 600 mg/m2 = 1,140 mg, Intravenous,  Once, 1 of 4 cycles Administration: 1,140 mg (08/04/2019) PACLitaxel (TAXOL) 150 mg in sodium chloride 0.9 % 250 mL chemo infusion (</= 16m/m2), 80 mg/m2, Intravenous,  Once, 0 of 4 cycles fosaprepitant (EMEND) 150 mg in sodium chloride 0.9 % 145 mL IVPB, 150 mg, Intravenous,  Once, 1 of 8 cycles Administration: 150 mg (08/04/2019)  for chemotherapy treatment.       RISK FACTORS:  Menarche was at age 64  First live birth at age 64   Ovaries intact: no.  Hysterectomy: yes.  Menopausal status: postmenopausal.  HRT use: 0 years. Colonoscopy: yes; normal. Mammogram within the last year: yes. Number of breast biopsies: 1. Up to date with pelvic exams: yes. Any excessive radiation exposure in the past: no  Past Medical History:  Diagnosis Date  . Asthma   . [place] ([place] 04/12/2016  . Diabetes mellitus without complication ([place]   . Hyperlipemia   . Hypertension     Past Surgical History:  Procedure Laterality Date  . BREAST BIOPSY Right 07/05/2019   u[place]x venus marker, path pending  . BREAST BIOPSY Right 07/05/2019   LN bx, hydromarker, path pending  . CESAREAN SECTION    . COLONOSCOPY WITH PROPOFOL N/A 09/11/2015   Procedure: COLONOSCOPY WITH PROPOFOL;  Surgeon: DLucilla Lame MD;  Location: [facility] ENDOSCOPY;  Service: Endoscopy;  Laterality: N/A;  . IR IMAGING GUIDED PORT INSERTION  07/20/2019  Social History   Socioeconomic History  . Marital status: Married    Spouse name: Not on file  . Number of children: Not on file  . Years of education: Not on file  . Highest education level: Not on file  Occupational History  . Not on file  Tobacco Use  . Smoking status: Former Smoker    Packs/day: 1.00    Years: 2.00    Pack years: 2.00    Types: Cigarettes    Quit date: 05/19/2019    Years since quitting: 0.2  . Smokeless tobacco: Never Used  Substance and  Sexual Activity  . Alcohol use: Yes    Alcohol/week: 2.0 standard drinks    Types: 2 Cans of beer per week    Comment: ocassionally   . Drug use: No  . Sexual activity: Never    Birth control/protection: Abstinence  Other Topics Concern  . Not on file  Social History Narrative  . Not on file   Social Determinants of Health   Financial Resource Strain:   . Difficulty of Paying Living Expenses:   Food Insecurity:   . Worried About Charity fundraiser in the Last Year:   . Arboriculturist in the Last Year:   Transportation Needs:   . Film/video editor (Medical):   Marland Kitchen Lack of Transportation (Non-Medical):   Physical Activity:   . Days of Exercise per Week:   . Minutes of Exercise per Session:   Stress:   . Feeling of Stress :   Social Connections:   . Frequency of Communication with Friends and Family:   . Frequency of Social Gatherings with Friends and Family:   . Attends Religious Services:   . Active Member of Clubs or Organizations:   . Attends Archivist Meetings:   Marland Kitchen Marital Status:      FAMILY HISTORY:  We obtained a detailed, 4-generation family history.  Significant diagnoses are listed below: Family History  Problem Relation Age of Onset  . Diabetes Mother   . Hypertension Mother   . Hypertension Father   . Diabetes Father     The patient has three children who are all cancer free.  She has a brother and three sisters who are all cancer free.  Both parents are deceased.  The patient's father left the family when the patient was 63 years old and she does not know his family.  The patient's mother died at 40 from a brain aneurysm.  She had two sisters who are deceased.  One sister had a daughter with an unknown cancer.  There is no other cancer history on the maternal side.  Dawn Alvarez is unaware of previous family history of genetic testing for hereditary cancer risks. Patient's maternal ancestors are of African American descent, and paternal  ancestors are of African American descent. There is no reported Ashkenazi Jewish ancestry. There is no known consanguinity.   GENETIC COUNSELING ASSESSMENT: Dawn Alvarez is a 64 y.o. female with a personal and family history of cancer which is somewhat suggestive of a sporadic predisposition to cancer given the lack of family history, however there is a limited paternal history. We, therefore, discussed and recommended the following at today's visit.   DISCUSSION: We discussed that 5 - 10% of breast cancer is hereditary, with most cases associated with BRCA mutations.  There are other genes that can be associated with hereditary breast cancer syndromes.  These include ATM, CHEK2 and PALB2.  We discussed  that testing is beneficial for several reasons including knowing how to follow individuals after completing their treatment, identifying whether potential treatment options such as PARP inhibitors would be beneficial, and understand if other family members could be at risk for cancer and allow them to undergo genetic testing.   We reviewed the characteristics, features and inheritance patterns of hereditary cancer syndromes. We also discussed genetic testing, including the appropriate family members to test, the process of testing, insurance coverage and turn-around-time for results. We discussed the implications of a negative, positive, carrier and/or variant of uncertain significant result. We recommended Dawn Alvarez pursue genetic testing for the common hereditary cancer panel gene panel.   Based on Dawn Alvarez's personal and family history of cancer, she does not meet strict criteria for testing based on NCCN.   PLAN: After considering the risks, benefits, and limitations, Dawn Alvarez provided informed consent to pursue genetic testing.  An order for mobile phlebotomy has been placed and Dawn Alvarez will be responsible for setting up an appointment to have her blood drawn.  Once drawn, the blood sample  will be [facility] to [facility] for analysis of the common hereditary cancer panel. Results should be available within approximately 2-3 weeks' time, at which point they will be disclosed by telephone to Dawn Alvarez, as will any additional recommendations warranted by these results. Dawn Alvarez will receive a summary of her genetic counseling visit and a copy of her results once available. This information will also be available in Epic.   Lastly, we encouraged Dawn Alvarez to remain in contact with cancer genetics annually so that we can continuously update the family history and inform her of any changes in cancer genetics and testing that may be of benefit for this family.   Dawn Alvarez questions were answered to her satisfaction today. Our contact information was provided should additional questions or concerns arise. Thank you for the referral and allowing [place] to share in the care of your patient.   Narjis Mira P. [place]ne [place], [place], [facility] Licensed, Insurance risk surveyor [email or web address] phone: [phone]  The patient was seen for a total of 30 minutes in face-to-face genetic counseling.  This patient was discussed with Drs. Magrinat, Lindi Adie and/or Burr Medico who agrees with the above.    _______________________________________________________________________ For Office Staff:  Number of people involved in session: 1 Was an Intern/ student involved with case: no

## 2019-08-18 ENCOUNTER — Encounter: Payer: Self-pay | Admitting: Oncology

## 2019-08-18 ENCOUNTER — Inpatient Hospital Stay: Payer: Medicare HMO

## 2019-08-18 ENCOUNTER — Inpatient Hospital Stay (HOSPITAL_BASED_OUTPATIENT_CLINIC_OR_DEPARTMENT_OTHER): Payer: Medicare HMO | Admitting: Oncology

## 2019-08-18 VITALS — BP 124/72 | HR 62 | Temp 98.6°F | Resp 18 | Wt 174.4 lb

## 2019-08-18 VITALS — BP 116/69 | HR 61 | Resp 18

## 2019-08-18 DIAGNOSIS — C50411 Malignant neoplasm of upper-outer quadrant of right female breast: Secondary | ICD-10-CM

## 2019-08-18 DIAGNOSIS — D701 Agranulocytosis secondary to cancer chemotherapy: Secondary | ICD-10-CM | POA: Diagnosis not present

## 2019-08-18 DIAGNOSIS — Z171 Estrogen receptor negative status [ER-]: Secondary | ICD-10-CM | POA: Diagnosis not present

## 2019-08-18 DIAGNOSIS — Z79899 Other long term (current) drug therapy: Secondary | ICD-10-CM | POA: Diagnosis not present

## 2019-08-18 DIAGNOSIS — E860 Dehydration: Secondary | ICD-10-CM

## 2019-08-18 DIAGNOSIS — Z5111 Encounter for antineoplastic chemotherapy: Secondary | ICD-10-CM | POA: Diagnosis not present

## 2019-08-18 DIAGNOSIS — Z5189 Encounter for other specified aftercare: Secondary | ICD-10-CM | POA: Diagnosis not present

## 2019-08-18 LAB — COMPREHENSIVE METABOLIC PANEL
ALT: 15 U/L (ref 0–44)
AST: 16 U/L (ref 15–41)
Albumin: 4.1 g/dL (ref 3.5–5.0)
Alkaline Phosphatase: 43 U/L (ref 38–126)
Anion gap: 11 (ref 5–15)
BUN: 29 mg/dL — ABNORMAL HIGH (ref 8–23)
CO2: 24 mmol/L (ref 22–32)
Calcium: 9.4 mg/dL (ref 8.9–10.3)
Chloride: 101 mmol/L (ref 98–111)
Creatinine, Ser: 1.16 mg/dL — ABNORMAL HIGH (ref 0.44–1.00)
GFR calc Af Amer: 58 mL/min — ABNORMAL LOW (ref >60)
GFR calc non Af Amer: 50 mL/min — ABNORMAL LOW (ref >60)
Glucose, Bld: 181 mg/dL — ABNORMAL HIGH (ref 70–99)
Potassium: 3.7 mmol/L (ref 3.5–5.1)
Sodium: 136 mmol/L (ref 135–145)
Total Bilirubin: 0.5 mg/dL (ref 0.3–1.2)
Total Protein: 7.4 g/dL (ref 6.5–8.1)

## 2019-08-18 LAB — CBC WITH DIFFERENTIAL/PLATELET
Abs Immature Granulocytes: 0.49 10*3/uL — ABNORMAL HIGH (ref 0.00–0.07)
Basophils Absolute: 0.1 10*3/uL (ref 0.0–0.1)
Basophils Relative: 0 %
Eosinophils Absolute: 0 10*3/uL (ref 0.0–0.5)
Eosinophils Relative: 0 %
HCT: 31.2 % — ABNORMAL LOW (ref 36.0–46.0)
Hemoglobin: 11.1 g/dL — ABNORMAL LOW (ref 12.0–15.0)
Immature Granulocytes: 2 %
Lymphocytes Relative: 11 %
Lymphs Abs: 2.3 10*3/uL (ref 0.7–4.0)
MCH: 28.5 pg (ref 26.0–34.0)
MCHC: 35.6 g/dL (ref 30.0–36.0)
MCV: 80.2 fL (ref 80.0–100.0)
Monocytes Absolute: 1.5 10*3/uL — ABNORMAL HIGH (ref 0.1–1.0)
Monocytes Relative: 7 %
Neutro Abs: 16.2 10*3/uL — ABNORMAL HIGH (ref 1.7–7.7)
Neutrophils Relative %: 80 %
Platelets: 199 10*3/uL (ref 150–400)
RBC: 3.89 MIL/uL (ref 3.87–5.11)
RDW: 13.2 % (ref 11.5–15.5)
WBC: 20.6 10*3/uL — ABNORMAL HIGH (ref 4.0–10.5)
nRBC: 0 % (ref 0.0–0.2)

## 2019-08-18 MED ORDER — PALONOSETRON HCL INJECTION 0.25 MG/5ML
0.2500 mg | Freq: Once | INTRAVENOUS | Status: AC
  Administered 2019-08-18: 0.25 mg via INTRAVENOUS
  Filled 2019-08-18: qty 5

## 2019-08-18 MED ORDER — SODIUM CHLORIDE 0.9% FLUSH
10.0000 mL | INTRAVENOUS | Status: DC | PRN
  Administered 2019-08-18: 10 mL via INTRAVENOUS
  Filled 2019-08-18: qty 10

## 2019-08-18 MED ORDER — SODIUM CHLORIDE 0.9 % IV SOLN
600.0000 mg/m2 | Freq: Once | INTRAVENOUS | Status: AC
  Administered 2019-08-18: 1140 mg via INTRAVENOUS
  Filled 2019-08-18: qty 50

## 2019-08-18 MED ORDER — HEPARIN SOD (PORK) LOCK FLUSH 100 UNIT/ML IV SOLN
500.0000 [IU] | Freq: Once | INTRAVENOUS | Status: AC
  Filled 2019-08-18: qty 5

## 2019-08-18 MED ORDER — HEPARIN SOD (PORK) LOCK FLUSH 100 UNIT/ML IV SOLN
INTRAVENOUS | Status: AC
  Filled 2019-08-18: qty 5

## 2019-08-18 MED ORDER — SODIUM CHLORIDE 0.9 % IV SOLN
150.0000 mg | Freq: Once | INTRAVENOUS | Status: AC
  Administered 2019-08-18: 150 mg via INTRAVENOUS
  Filled 2019-08-18: qty 150

## 2019-08-18 MED ORDER — SODIUM CHLORIDE 0.9 % IV SOLN
Freq: Once | INTRAVENOUS | Status: AC
  Filled 2019-08-18: qty 250

## 2019-08-18 MED ORDER — HEPARIN SOD (PORK) LOCK FLUSH 100 UNIT/ML IV SOLN
500.0000 [IU] | Freq: Once | INTRAVENOUS | Status: AC | PRN
  Administered 2019-08-18: 500 [IU]
  Filled 2019-08-18: qty 5

## 2019-08-18 MED ORDER — DOXORUBICIN HCL CHEMO IV INJECTION 2 MG/ML
60.0000 mg/m2 | Freq: Once | INTRAVENOUS | Status: AC
  Administered 2019-08-18: 114 mg via INTRAVENOUS
  Filled 2019-08-18: qty 10

## 2019-08-18 MED ORDER — SODIUM CHLORIDE 0.9 % IV SOLN
10.0000 mg | Freq: Once | INTRAVENOUS | Status: AC
  Administered 2019-08-18: 10 mg via INTRAVENOUS
  Filled 2019-08-18: qty 10

## 2019-08-18 MED ORDER — SODIUM CHLORIDE 0.9% FLUSH
10.0000 mL | INTRAVENOUS | Status: DC | PRN
  Filled 2019-08-18: qty 10

## 2019-08-18 NOTE — Progress Notes (Signed)
Message received from Janeann Merl, RN stating Dr. Tasia Catchings is okay with patient proceeding with chemo despite elevated WBC and Creatinine. Requested that IVF be added to treatment today.

## 2019-08-18 NOTE — Progress Notes (Signed)
Patient here for follow up. Pt reports starting to drink boost and has had some diarrhea, she is unsure if it is chemo related or its due to the boost.

## 2019-08-19 ENCOUNTER — Telehealth: Payer: Self-pay | Admitting: Oncology

## 2019-08-19 ENCOUNTER — Inpatient Hospital Stay: Payer: Medicare HMO

## 2019-08-19 NOTE — Progress Notes (Signed)
Hematology/Oncology follow up note [facility] [phone] Fax:([phone]) [phone]   Patient Care Team: Lavera Guise, MD as PCP - General (Internal Medicine) Rico Junker, RN as Registered Nurse Theodore Demark, RN as Registered Nurse  REFERRING PROVIDER: Lavera Guise, MD  CHIEF COMPLAINTS/REASON FOR VISIT:  Follow-up for breast cancer  HISTORY OF PRESENTING ILLNESS:   Dawn Alvarez is a  64 y.o.  female with PMH listed below was seen in consultation at the request of  Lavera Guise, MD  for evaluation of breast cancer Patient had screening mammogram done 08/12/2017 which showed right breast asymmetry.  A diagnostic mammogram was suggested and patient did not have it done. Patient felt her right breast mass for a few weeks.  She had bilateral diagnostic mammogram done on 06/30/2019. 2.8 x 2.4 x 2.6 cm right breast mass, 11:00, 10 cm from the nipple.  There is a single mildly abnormal node in the right axilla with a cortex measuring up to 4.4 mm.  No other suspicious findings. Patient underwent ultrasound-guided core biopsy of the right breast mass and right axilla lymph node Pathology showed invasive mammary carcinoma, no special type, grade 3, ER/PR HER-2 status are pending. Right axillary lymph node biopsy showed predominantly blood in the fibroadipose tissue, with scant lymphoid tissue present.  No definite malignancy was identified.  Patient was referred to cancer center to establish care and discuss treatment plan. Menarche 61 Postmenopausal.  LMP when she was 64 years old. She recalls use of birth control pills. Denies any hormone .  Replacement therapy. Denies any prior chest radiation. She reports family history of maternal grandmother and 2 maternal cousins were diagnosed with cancer.  She does not know about details.  INTERVAL HISTORY Dawn Alvarez is a 64 y.o. female who has above history reviewed by me today presents for follow up  visit for management of breast cancer, evaluation prior to chemotherapy. Problems and complaints are listed below: Patient was accompanied by daughter. She reports feeling well.  No new complaints.  Denies any nausea, vomiting, diarrhea, fever or chills.    Review of Systems  Constitutional: Negative for appetite change, chills, fatigue and fever.  HENT:   Negative for hearing loss and voice change.   Eyes: Negative for eye problems.  Respiratory: Negative for chest tightness and cough.   Cardiovascular: Negative for chest pain.  Gastrointestinal: Negative for abdominal distention, abdominal pain and blood in stool.  Endocrine: Negative for hot flashes.  Genitourinary: Negative for difficulty urinating and frequency.   Musculoskeletal: Negative for arthralgias.  Skin: Negative for itching and rash.  Neurological: Negative for extremity weakness.  Hematological: Negative for adenopathy.  Psychiatric/Behavioral: Negative for confusion.    MEDICAL HISTORY:  Past Medical History:  Diagnosis Date  . Asthma   . COPD exacerbation ([place]) 04/12/2016  . Diabetes mellitus without complication ([place])   . Hyperlipemia   . Hypertension     SURGICAL HISTORY: Past Surgical History:  Procedure Laterality Date  . BREAST BIOPSY Right 07/05/2019   [place] bx venus marker, path pending  . BREAST BIOPSY Right 07/05/2019   LN bx, hydromarker, path pending  . CESAREAN SECTION    . COLONOSCOPY WITH PROPOFOL N/A 09/11/2015   Procedure: COLONOSCOPY WITH PROPOFOL;  Surgeon: Lucilla Lame, MD;  Location: [facility] ENDOSCOPY;  Service: Endoscopy;  Laterality: N/A;  . IR IMAGING GUIDED PORT INSERTION  07/20/2019    SOCIAL HISTORY: Social History   Socioeconomic History  . Marital  status: Married    Spouse name: Not on file  . Number of children: Not on file  . Years of education: Not on file  . Highest education level: Not on file  Occupational History  . Not on file  Tobacco Use  . Smoking status:  Former Smoker    Packs/day: 1.00    Years: 2.00    Pack years: 2.00    Types: Cigarettes    Quit date: 05/19/2019    Years since quitting: 0.2  . Smokeless tobacco: Never Used  Substance and Sexual Activity  . Alcohol use: Yes    Alcohol/week: 2.0 standard drinks    Types: 2 Cans of beer per week    Comment: ocassionally   . Drug use: No  . Sexual activity: Never    Birth control/protection: Abstinence  Other Topics Concern  . Not on file  Social History Narrative  . Not on file   Social Determinants of Health   Financial Resource Strain:   . Difficulty of Paying Living Expenses:   Food Insecurity:   . Worried About Charity fundraiser in the Last Year:   . Arboriculturist in the Last Year:   Transportation Needs:   . Film/video editor (Medical):   Marland Kitchen Lack of Transportation (Non-Medical):   Physical Activity:   . Days of Exercise per Week:   . Minutes of Exercise per Session:   Stress:   . Feeling of Stress :   Social Connections:   . Frequency of Communication with Friends and Family:   . Frequency of Social Gatherings with Friends and Family:   . Attends Religious Services:   . Active Member of Clubs or Organizations:   . Attends Archivist Meetings:   Marland Kitchen Marital Status:   Intimate Partner Violence:   . Fear of Current or Ex-Partner:   . Emotionally Abused:   Marland Kitchen Physically Abused:   . Sexually Abused:     FAMILY HISTORY: Family History  Problem Relation Age of Onset  . Diabetes Mother   . Hypertension Mother   . Hypertension Father   . Diabetes Father     ALLERGIES:  has No Known Allergies.  MEDICATIONS:  Current Outpatient Medications  Medication Sig Dispense Refill  . amLODipine (NORVASC) 10 MG tablet Take 1 tablet (10 mg total) by mouth daily. 30 tablet 5  . bisoprolol-hydrochlorothiazide (ZIAC) 10-6.25 MG tablet Take 1 tablet by mouth daily. 30 tablet 2  . glucose blood (ACCU-CHEK SMARTVIEW) test strip 1 each by Other route as needed  for other. Use as directed twice a day diag E11.65    . hydrALAZINE (APRESOLINE) 50 MG tablet Take 1 tablet (50 mg total) by mouth 3 (three) times daily. 90 tablet 3  . insulin detemir (LEVEMIR) 100 UNIT/ML injection Inject 0.4 mLs (40 Units total) into the skin daily. 10 mL 5  . Insulin Pen Needle (PEN NEEDLES) 31G X 8 MM MISC Use as directed with insulin E11.65 100 each 1  . Lancets Misc. (ACCU-CHEK MULTICLIX LANCET DEV) KIT by Does not apply route. Use as directed twice a day diag E11.65    . lisinopril (ZESTRIL) 20 MG tablet Take 1 tablet (20 mg total) by mouth daily. 30 tablet 3  . Omega-3 Fatty Acids (FISH OIL) 1000 MG CAPS Take 1,000 mg by mouth daily.     . prochlorperazine (COMPAZINE) 10 MG tablet Take 10 mg by mouth every 6 (six) hours as needed for nausea or vomiting.    Marland Kitchen  Semaglutide, 1 MG/DOSE, (OZEMPIC, 1 MG/DOSE,) 2 MG/1.5ML SOPN Inject 1 mg into the skin once a week. 5 pen 3  . SIMBRINZA 1-0.2 % SUSP      No current facility-administered medications for this visit.     PHYSICAL EXAMINATION: ECOG PERFORMANCE STATUS: 0 - Asymptomatic Vitals:   08/18/19 0900  BP: 124/72  Pulse: 62  Resp: 18  Temp: 98.6 F (37 C)   Filed Weights   08/18/19 0900  Weight: 174 lb 6.4 oz (79.1 kg)    Physical Exam Constitutional:      General: She is not in acute distress. HENT:     Head: Normocephalic and atraumatic.  Eyes:     General: No scleral icterus. Cardiovascular:     Rate and Rhythm: Normal rate and regular rhythm.     Heart sounds: Normal heart sounds.  Pulmonary:     Effort: Pulmonary effort is normal. No respiratory distress.     Breath sounds: No wheezing.  Abdominal:     General: Bowel sounds are normal. There is no distension.     Palpations: Abdomen is soft.  Musculoskeletal:        General: No deformity. Normal range of motion.     Cervical back: Normal range of motion and neck supple.  Skin:    General: Skin is warm and dry.     Findings: No erythema or  rash.  Neurological:     Mental Status: She is alert and oriented to person, place, and time. Mental status is at baseline.     Cranial Nerves: No cranial nerve deficit.     Coordination: Coordination normal.  Psychiatric:        Mood and Affect: Mood normal.       LABORATORY DATA:  I have reviewed the data as listed Lab Results  Component Value Date   WBC 20.6 (H) 08/18/2019   HGB 11.1 (L) 08/18/2019   HCT 31.2 (L) 08/18/2019   MCV 80.2 08/18/2019   PLT 199 08/18/2019   Recent Labs    08/04/19 0806 08/11/19 1046 08/18/19 0828  NA 137 133* 136  K 3.9 3.7 3.7  CL 104 100 101  CO2 _0 GLUCOSE 167* 244* 181*  BUN 29* 17 29*  CREATININE 1.21* 0.92 1.16*  CALCIUM 9.6 9.0 9.4  GFRNONAA 48* >60 50*  GFRAA 55* >60 58*  PROT 7.9 7.0 7.4  ALBUMIN 4.0 3.6 4.1  AST _1 ALT _2 ALKPHOS 35* 56 43  BILITOT 0.6 0.2* 0.5   Iron/TIBC/Ferritin/ %Sat No results found for: IRON, TIBC, FERRITIN, IRONPCTSAT    RADIOGRAPHIC STUDIES: I have personally reviewed the radiological images as listed and agreed with the findings in the report.  CT Head Wo Contrast  Result Date: 07/30/2019 CLINICAL DATA:  64 year old female with altered mental status EXAM: CT HEAD WITHOUT CONTRAST TECHNIQUE: Contiguous axial images were obtained from the base of the skull through the vertex without intravenous contrast. COMPARISON:  MR 05/04/2010, CT 12/28/2018 FINDINGS: Brain: No acute intracranial hemorrhage. No midline shift or mass effect. Gray-white differentiation maintained. Confluent hypodensity in the periventricular white matter, similar to the prior MR and CT. Unremarkable appearance of the ventricular system. Vascular: Mild atherosclerotic calcifications. Skull: No acute fracture.  No aggressive bone lesion identified. Sinuses/Orbits: Unremarkable appearance of the orbits. Mastoid air cells clear. No middle ear effusion. No significant sinus disease. Other: None IMPRESSION: Negative  for acute intracranial abnormality. Chronic microvascular ischemic disease. Electronically  Signed   By: Corrie Mckusick D.O.   On: 07/30/2019 14:46   NM Cardiac Muga Rest  Result Date: 07/25/2019 CLINICAL DATA:  Breast cancer. Evaluate cardiac function in relation to chemotherapy. EXAM: NUCLEAR MEDICINE CARDIAC BLOOD POOL IMAGING (MUGA) TECHNIQUE: Cardiac multi-gated acquisition was performed at rest following intravenous injection of Tc-26mlabeled red blood cells. RADIOPHARMACEUTICALS:  22.8 mCi Tc-951mertechnetate in-vitro labeled red blood cells IV COMPARISON:  None FINDINGS: No  focal wall motion abnormality of the left ventricle. Calculated left ventricular ejection fraction equals 74 % IMPRESSION: Left ventricular ejection fraction equals74 %. Electronically Signed   By: StSuzy Bouchard.D.   On: 07/25/2019 15:16      ASSESSMENT & PLAN:  1. Malignant neoplasm of upper-outer quadrant of right breast in female, estrogen receptor negative ([place]  2. Encounter for antineoplastic chemotherapy    cT2N0 grade 3 invasive mammary carcinoma.  ER negative, PR weakly positive (<=10%), HER-2 negative Status post 1 cycle of ddAC with growth factor support. Tolerates well.  Labs are reviewed and discussed with patient. Counts are acceptable to proceed with cycle 2 ddAC with Fulphila on Day 2   #Uncontrolled diabetes, recent A1c on 06/20/2019 was 8.2.  Patient is on Levemir, semaglutide.  Blood glucose is 181 today, improved comparing to last visit.  All questions were answered. The patient knows to call the clinic with any problems questions or concerns.   Return of visit:  2 weeks.   ZhEarlie ServerMD, PhD Hematology Oncology [facility]- [phone]/19/2021
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
## 2019-08-22 ENCOUNTER — Other Ambulatory Visit: Payer: Self-pay

## 2019-08-22 ENCOUNTER — Inpatient Hospital Stay: Payer: Medicare HMO

## 2019-08-22 DIAGNOSIS — C50411 Malignant neoplasm of upper-outer quadrant of right female breast: Secondary | ICD-10-CM | POA: Diagnosis not present

## 2019-08-22 DIAGNOSIS — D701 Agranulocytosis secondary to cancer chemotherapy: Secondary | ICD-10-CM | POA: Diagnosis not present

## 2019-08-22 DIAGNOSIS — Z5189 Encounter for other specified aftercare: Secondary | ICD-10-CM | POA: Diagnosis not present

## 2019-08-22 DIAGNOSIS — E860 Dehydration: Secondary | ICD-10-CM | POA: Diagnosis not present

## 2019-08-22 DIAGNOSIS — Z5111 Encounter for antineoplastic chemotherapy: Secondary | ICD-10-CM | POA: Diagnosis not present

## 2019-08-22 DIAGNOSIS — Z171 Estrogen receptor negative status [ER-]: Secondary | ICD-10-CM | POA: Diagnosis not present

## 2019-08-22 DIAGNOSIS — Z79899 Other long term (current) drug therapy: Secondary | ICD-10-CM | POA: Diagnosis not present

## 2019-08-22 MED ORDER — PEGFILGRASTIM-JMDB 6 MG/0.6ML ~~LOC~~ SOSY
6.0000 mg | PREFILLED_SYRINGE | Freq: Once | SUBCUTANEOUS | Status: AC
  Administered 2019-08-22: 6 mg via SUBCUTANEOUS
  Filled 2019-08-22: qty 0.6

## 2019-08-25 ENCOUNTER — Telehealth: Payer: Self-pay

## 2019-08-25 ENCOUNTER — Other Ambulatory Visit: Payer: Self-pay

## 2019-08-25 MED ORDER — BISOPROLOL-HYDROCHLOROTHIAZIDE 10-6.25 MG PO TABS: 1.0000 | ORAL_TABLET | Freq: Every day | ORAL | 0 refills | Status: DC

## 2019-08-25 NOTE — Progress Notes (Signed)

## 2019-08-25 NOTE — Telephone Encounter (Signed)
Contacted patient to follow up after 2nd chemotherapy cycle. Pt reports no concerns and states "everything is going well."

## 2019-09-01 ENCOUNTER — Encounter: Payer: Self-pay | Admitting: *Deleted

## 2019-09-01 ENCOUNTER — Inpatient Hospital Stay (HOSPITAL_BASED_OUTPATIENT_CLINIC_OR_DEPARTMENT_OTHER): Payer: Medicare HMO | Admitting: Oncology

## 2019-09-01 ENCOUNTER — Inpatient Hospital Stay: Payer: Medicare HMO

## 2019-09-01 ENCOUNTER — Encounter: Payer: Self-pay | Admitting: Oncology

## 2019-09-01 ENCOUNTER — Inpatient Hospital Stay: Payer: Medicare HMO | Attending: Oncology

## 2019-09-01 ENCOUNTER — Other Ambulatory Visit: Payer: Self-pay

## 2019-09-01 VITALS — BP 98/61 | HR 62 | Temp 94.7°F | Resp 18 | Wt 179.1 lb

## 2019-09-01 DIAGNOSIS — R55 Syncope and collapse: Secondary | ICD-10-CM | POA: Diagnosis not present

## 2019-09-01 DIAGNOSIS — I9589 Other hypotension: Secondary | ICD-10-CM

## 2019-09-01 DIAGNOSIS — C50411 Malignant neoplasm of upper-outer quadrant of right female breast: Secondary | ICD-10-CM

## 2019-09-01 DIAGNOSIS — Z794 Long term (current) use of insulin: Secondary | ICD-10-CM | POA: Insufficient documentation

## 2019-09-01 DIAGNOSIS — I10 Essential (primary) hypertension: Secondary | ICD-10-CM | POA: Insufficient documentation

## 2019-09-01 DIAGNOSIS — E1165 Type 2 diabetes mellitus with hyperglycemia: Secondary | ICD-10-CM | POA: Diagnosis not present

## 2019-09-01 DIAGNOSIS — Z5111 Encounter for antineoplastic chemotherapy: Secondary | ICD-10-CM | POA: Diagnosis not present

## 2019-09-01 DIAGNOSIS — E860 Dehydration: Secondary | ICD-10-CM

## 2019-09-01 DIAGNOSIS — Z171 Estrogen receptor negative status [ER-]: Secondary | ICD-10-CM | POA: Insufficient documentation

## 2019-09-01 DIAGNOSIS — E11649 Type 2 diabetes mellitus with hypoglycemia without coma: Secondary | ICD-10-CM | POA: Insufficient documentation

## 2019-09-01 DIAGNOSIS — E119 Type 2 diabetes mellitus without complications: Secondary | ICD-10-CM | POA: Diagnosis not present

## 2019-09-01 DIAGNOSIS — Z5189 Encounter for other specified aftercare: Secondary | ICD-10-CM | POA: Diagnosis not present

## 2019-09-01 DIAGNOSIS — Z79899 Other long term (current) drug therapy: Secondary | ICD-10-CM | POA: Diagnosis not present

## 2019-09-01 DIAGNOSIS — Z87891 Personal history of nicotine dependence: Secondary | ICD-10-CM | POA: Insufficient documentation

## 2019-09-01 LAB — COMPREHENSIVE METABOLIC PANEL
ALT: 14 U/L (ref 0–44)
AST: 15 U/L (ref 15–41)
Albumin: 3.6 g/dL (ref 3.5–5.0)
Alkaline Phosphatase: 64 U/L (ref 38–126)
Anion gap: 9 (ref 5–15)
BUN: 22 mg/dL (ref 8–23)
CO2: 25 mmol/L (ref 22–32)
Calcium: 9.1 mg/dL (ref 8.9–10.3)
Chloride: 101 mmol/L (ref 98–111)
Creatinine, Ser: 1.06 mg/dL — ABNORMAL HIGH (ref 0.44–1.00)
GFR calc Af Amer: >60 mL/min (ref >60)
GFR calc non Af Amer: 56 mL/min — ABNORMAL LOW (ref >60)
Glucose, Bld: 272 mg/dL — ABNORMAL HIGH (ref 70–99)
Potassium: 3.8 mmol/L (ref 3.5–5.1)
Sodium: 135 mmol/L (ref 135–145)
Total Bilirubin: 0.3 mg/dL (ref 0.3–1.2)
Total Protein: 6.7 g/dL (ref 6.5–8.1)

## 2019-09-01 LAB — CBC WITH DIFFERENTIAL/PLATELET
Abs Immature Granulocytes: 1.34 10*3/uL — ABNORMAL HIGH (ref 0.00–0.07)
Basophils Absolute: 0.2 10*3/uL — ABNORMAL HIGH (ref 0.0–0.1)
Basophils Relative: 1 %
Eosinophils Absolute: 0.1 10*3/uL (ref 0.0–0.5)
Eosinophils Relative: 0 %
HCT: 29.8 % — ABNORMAL LOW (ref 36.0–46.0)
Hemoglobin: 10.3 g/dL — ABNORMAL LOW (ref 12.0–15.0)
Immature Granulocytes: 7 %
Lymphocytes Relative: 17 %
Lymphs Abs: 3.3 10*3/uL (ref 0.7–4.0)
MCH: 28.4 pg (ref 26.0–34.0)
MCHC: 34.6 g/dL (ref 30.0–36.0)
MCV: 82.1 fL (ref 80.0–100.0)
Monocytes Absolute: 2.1 10*3/uL — ABNORMAL HIGH (ref 0.1–1.0)
Monocytes Relative: 11 %
Neutro Abs: 12.2 10*3/uL — ABNORMAL HIGH (ref 1.7–7.7)
Neutrophils Relative %: 64 %
Platelets: 139 10*3/uL — ABNORMAL LOW (ref 150–400)
RBC: 3.63 MIL/uL — ABNORMAL LOW (ref 3.87–5.11)
RDW: 13.7 % (ref 11.5–15.5)
WBC: 19.1 10*3/uL — ABNORMAL HIGH (ref 4.0–10.5)
nRBC: 0.2 % (ref 0.0–0.2)

## 2019-09-01 MED ORDER — DOXORUBICIN HCL CHEMO IV INJECTION 2 MG/ML
60.0000 mg/m2 | Freq: Once | INTRAVENOUS | Status: AC
  Administered 2019-09-01: 11:00:00 114 mg via INTRAVENOUS
  Filled 2019-09-01: qty 50

## 2019-09-01 MED ORDER — HEPARIN SOD (PORK) LOCK FLUSH 100 UNIT/ML IV SOLN
500.0000 [IU] | Freq: Once | INTRAVENOUS | Status: DC | PRN
  Filled 2019-09-01: qty 5

## 2019-09-01 MED ORDER — PALONOSETRON HCL INJECTION 0.25 MG/5ML
0.2500 mg | Freq: Once | INTRAVENOUS | Status: AC
  Administered 2019-09-01: 0.25 mg via INTRAVENOUS
  Filled 2019-09-01: qty 5

## 2019-09-01 MED ORDER — HEPARIN SOD (PORK) LOCK FLUSH 100 UNIT/ML IV SOLN
INTRAVENOUS | Status: AC
  Filled 2019-09-01: qty 5

## 2019-09-01 MED ORDER — SODIUM CHLORIDE 0.9 % IV SOLN
Freq: Once | INTRAVENOUS | Status: AC
  Filled 2019-09-01: qty 250

## 2019-09-01 MED ORDER — HEPARIN SOD (PORK) LOCK FLUSH 100 UNIT/ML IV SOLN
500.0000 [IU] | Freq: Once | INTRAVENOUS | Status: AC
  Administered 2019-09-01: 500 [IU] via INTRAVENOUS
  Filled 2019-09-01: qty 5

## 2019-09-01 MED ORDER — SODIUM CHLORIDE 0.9 % IV SOLN
150.0000 mg | Freq: Once | INTRAVENOUS | Status: AC
  Administered 2019-09-01: 150 mg via INTRAVENOUS
  Filled 2019-09-01: qty 150

## 2019-09-01 MED ORDER — SODIUM CHLORIDE 0.9% FLUSH
10.0000 mL | INTRAVENOUS | Status: DC | PRN
  Administered 2019-09-01: 08:00:00 10 mL via INTRAVENOUS
  Filled 2019-09-01: qty 10

## 2019-09-01 MED ORDER — SODIUM CHLORIDE 0.9 % IV SOLN
10.0000 mg | Freq: Once | INTRAVENOUS | Status: AC
  Administered 2019-09-01: 10 mg via INTRAVENOUS
  Filled 2019-09-01: qty 10

## 2019-09-01 MED ORDER — SODIUM CHLORIDE 0.9 % IV SOLN
600.0000 mg/m2 | Freq: Once | INTRAVENOUS | Status: AC
  Administered 2019-09-01: 1140 mg via INTRAVENOUS
  Filled 2019-09-01: qty 50

## 2019-09-01 NOTE — Progress Notes (Signed)
Hematology/Oncology follow up note [facility] [phone] [phone]   Patient Care Team: Lavera Guise, MD as PCP - General (Internal Medicine) Rico Junker, RN as Registered Nurse Theodore Demark, RN as Registered Nurse  REFERRING PROVIDER: Lavera Guise, MD  CHIEF COMPLAINTS/REASON FOR VISIT:  Follow-up for breast cancer  HISTORY OF PRESENTING ILLNESS:   Dawn Alvarez is a  64 y.o.  female with PMH listed below was seen in consultation at the request of  Lavera Guise, MD  for evaluation of breast cancer Patient had screening mammogram done 08/12/2017 which showed right breast asymmetry.  A diagnostic mammogram was suggested and patient did not have it done. Patient felt her right breast mass for a few weeks.  She had bilateral diagnostic mammogram done on 06/30/2019. 2.8 x 2.4 x 2.6 cm right breast mass, 11:00, 10 cm from the nipple.  There is a single mildly abnormal node in the right axilla with a cortex measuring up to 4.4 mm.  No other suspicious findings. Patient underwent ultrasound-guided core biopsy of the right breast mass and right axilla lymph node Pathology showed invasive mammary carcinoma, no special type, grade 3, ER/PR HER-2 status are pending. Right axillary lymph node biopsy showed predominantly blood in the fibroadipose tissue, with scant lymphoid tissue present.  No definite malignancy was identified.  Patient was referred to cancer center to establish care and discuss treatment plan. Menarche 30 Postmenopausal.  LMP when she was 64 years old. She recalls use of birth control pills. Denies any hormone .  Replacement therapy. Denies any prior chest radiation. She reports family history of maternal grandmother and 2 maternal cousins were diagnosed with cancer.  She does not know about details.  INTERVAL HISTORY Dawn Alvarez is a 64 y.o. female who has above history reviewed by me today presents for follow up  visit for management of breast cancer, evaluation prior to chemotherapy. Problems and complaints are listed below: Patient was accompanied by daughter. She reports feeling well. She has diabetes and her glucose has been running high. she reports being compliant with diabetic medication.  She does not check her blood glucose at home. Blood pressure at home is running between 109-323 systolic.  She denies any dizziness today. Denies any nausea, vomiting, diarrhea, fever or chills.  Review of Systems  Constitutional: Negative for appetite change, chills, fatigue and fever.  HENT:   Negative for hearing loss and voice change.   Eyes: Negative for eye problems.  Respiratory: Negative for chest tightness and cough.   Cardiovascular: Negative for chest pain.  Gastrointestinal: Negative for abdominal distention, abdominal pain and blood in stool.  Endocrine: Negative for hot flashes.  Genitourinary: Negative for difficulty urinating and frequency.   Musculoskeletal: Negative for arthralgias.  Skin: Negative for itching and rash.  Neurological: Negative for extremity weakness.  Hematological: Negative for adenopathy.  Psychiatric/Behavioral: Negative for confusion.    MEDICAL HISTORY:  Past Medical History:  Diagnosis Date  . Asthma   . COPD exacerbation ([place]) 04/12/2016  . Diabetes mellitus without complication ([employer])   . Hyperlipemia   . Hypertension     SURGICAL HISTORY: Past Surgical History:  Procedure Laterality Date  . BREAST BIOPSY Right 07/05/2019   [place] bx venus marker, path pending  . BREAST BIOPSY Right 07/05/2019   LN bx, hydromarker, path pending  . CESAREAN SECTION    . COLONOSCOPY WITH PROPOFOL N/A 09/11/2015   Procedure: COLONOSCOPY WITH PROPOFOL;  Surgeon: Evangeline Gula  Allen Norris, MD;  Location: [facility];  Service: Endoscopy;  Laterality: N/A;  . IR IMAGING GUIDED PORT INSERTION  07/20/2019    SOCIAL HISTORY: Social History   Socioeconomic History  . Marital status:  Married    Spouse name: Not on file  . Number of children: Not on file  . Years of education: Not on file  . Highest education level: Not on file  Occupational History  . Not on file  Tobacco Use  . Smoking status: Former Smoker    Packs/day: 1.00    Years: 2.00    Pack years: 2.00    Types: Cigarettes    Quit date: 05/19/2019    Years since quitting: 0.2  . Smokeless tobacco: Never Used  Substance and Sexual Activity  . Alcohol use: Yes    Alcohol/week: 2.0 standard drinks    Types: 2 Cans of beer per week    Comment: ocassionally   . Drug use: No  . Sexual activity: Never    Birth control/protection: Abstinence  Other Topics Concern  . Not on file  Social History Narrative  . Not on file   Social Determinants of Health   Financial Resource Strain:   . Difficulty of Paying Living Expenses:   Food Insecurity:   . Worried About Charity fundraiser in the Last Year:   . Arboriculturist in the Last Year:   Transportation Needs:   . Film/video editor (Medical):   Marland Kitchen Lack of Transportation (Non-Medical):   Physical Activity:   . Days of Exercise per Week:   . Minutes of Exercise per Session:   Stress:   . Feeling of Stress :   Social Connections:   . Frequency of Communication with Friends and Family:   . Frequency of Social Gatherings with Friends and Family:   . Attends Religious Services:   . Active Member of Clubs or Organizations:   . Attends Archivist Meetings:   Marland Kitchen Marital Status:   Intimate Partner Violence:   . Fear of Current or Ex-Partner:   . Emotionally Abused:   Marland Kitchen Physically Abused:   . Sexually Abused:     FAMILY HISTORY: Family History  Problem Relation Age of Onset  . Diabetes Mother   . Hypertension Mother   . Hypertension Father   . Diabetes Father     ALLERGIES:  has No Known Allergies.  MEDICATIONS:  Current Outpatient Medications  Medication Sig Dispense Refill  . amLODipine (NORVASC) 10 MG tablet Take 1 tablet (10  mg total) by mouth daily. 30 tablet 5  . bisoprolol-hydrochlorothiazide (ZIAC) 10-6.25 MG tablet Take 1 tablet by mouth daily. 90 tablet 0  . dexamethasone (DECADRON) 4 MG tablet     . glucose blood (ACCU-CHEK SMARTVIEW) test strip 1 each by Other route as needed for other. Use as directed twice a day diag E11.65    . hydrALAZINE (APRESOLINE) 50 MG tablet Take 1 tablet (50 mg total) by mouth 3 (three) times daily. 90 tablet 3  . insulin detemir (LEVEMIR) 100 UNIT/ML injection Inject 0.4 mLs (40 Units total) into the skin daily. 10 mL 5  . Insulin Pen Needle (PEN NEEDLES) 31G X 8 MM MISC Use as directed with insulin E11.65 100 each 1  . Lancets Misc. (ACCU-CHEK MULTICLIX LANCET DEV) KIT by Does not apply route. Use as directed twice a day diag E11.65    . lidocaine-prilocaine (EMLA) cream     . lisinopril (ZESTRIL) 20 MG tablet  Take 1 tablet (20 mg total) by mouth daily. 30 tablet 3  . Omega-3 Fatty Acids (FISH OIL) 1000 MG CAPS Take 1,000 mg by mouth daily.     . prochlorperazine (COMPAZINE) 10 MG tablet Take 10 mg by mouth every 6 (six) hours as needed for nausea or vomiting.    . Semaglutide, 1 MG/DOSE, (OZEMPIC, 1 MG/DOSE,) 2 MG/1.5ML SOPN Inject 1 mg into the skin once a week. 5 pen 3  . SIMBRINZA 1-0.2 % SUSP      No current facility-administered medications for this visit.   Facility-Administered Medications Ordered in Other Visits  Medication Dose Route Frequency Provider Last Rate Last Admin  . heparin lock flush 100 unit/mL  500 Units Intravenous Once Earlie Server, MD      . sodium chloride flush (NS) 0.9 % injection 10 mL  10 mL Intravenous PRN Earlie Server, MD   10 mL at 09/01/19 0818     PHYSICAL EXAMINATION: ECOG PERFORMANCE STATUS: 0 - Asymptomatic Vitals:   09/01/19 0833  BP: 98/61  Pulse: 62  Resp: 18  Temp: (!) 94.7 F (34.8 C)   Filed Weights   09/01/19 0833  Weight: 179 lb 1.6 oz (81.2 kg)    Physical Exam Constitutional:      General: She is not in acute  distress. HENT:     Head: Normocephalic and atraumatic.  Eyes:     General: No scleral icterus. Cardiovascular:     Rate and Rhythm: Normal rate and regular rhythm.     Heart sounds: Normal heart sounds.  Pulmonary:     Effort: Pulmonary effort is normal. No respiratory distress.     Breath sounds: No wheezing.  Abdominal:     General: Bowel sounds are normal. There is no distension.     Palpations: Abdomen is soft.  Musculoskeletal:        General: No deformity. Normal range of motion.     Cervical back: Normal range of motion and neck supple.  Skin:    General: Skin is warm and dry.     Findings: No erythema or rash.  Neurological:     Mental Status: She is alert and oriented to person, place, and time. Mental status is at baseline.     Cranial Nerves: No cranial nerve deficit.     Coordination: Coordination normal.  Psychiatric:        Mood and Affect: Mood normal.       LABORATORY DATA:  I have reviewed the data as listed Lab Results  Component Value Date   WBC 19.1 (H) 09/01/2019   HGB 10.3 (L) 09/01/2019   HCT 29.8 (L) 09/01/2019   MCV 82.1 09/01/2019   PLT 139 (L) 09/01/2019   Recent Labs    08/04/19 0806 08/11/19 1046 08/18/19 0828  NA 137 133* 136  K 3.9 3.7 3.7  CL 104 100 101  CO2 '22 23 24  ' GLUCOSE 167* 244* 181*  BUN 29* 17 29*  CREATININE 1.21* 0.92 1.16*  CALCIUM 9.6 9.0 9.4  GFRNONAA 48* >60 50*  GFRAA 55* >60 58*  PROT 7.9 7.0 7.4  ALBUMIN 4.0 3.6 4.1  AST '18 18 16  ' ALT '14 18 15  ' ALKPHOS 35* 56 43  BILITOT 0.6 0.2* 0.5   Iron/TIBC/Ferritin/ %Sat No results found for: IRON, TIBC, FERRITIN, IRONPCTSAT    RADIOGRAPHIC STUDIES: I have personally reviewed the radiological images as listed and agreed with the findings in the report. CT Head Wo Contrast  Result  Date: 07/30/2019 CLINICAL DATA:  64 year old female with altered mental status EXAM: CT HEAD WITHOUT CONTRAST TECHNIQUE: Contiguous axial images were obtained from the base of the  skull through the vertex without intravenous contrast. COMPARISON:  MR 05/04/2010, CT 12/28/2018 FINDINGS: Brain: No acute intracranial hemorrhage. No midline shift or mass effect. Gray-white differentiation maintained. Confluent hypodensity in the periventricular white matter, similar to the prior MR and CT. Unremarkable appearance of the ventricular system. Vascular: Mild atherosclerotic calcifications. Skull: No acute fracture.  No aggressive bone lesion identified. Sinuses/Orbits: Unremarkable appearance of the orbits. Mastoid air cells clear. No middle ear effusion. No significant sinus disease. Other: None IMPRESSION: Negative for acute intracranial abnormality. Chronic microvascular ischemic disease. Electronically Signed   By: Corrie Mckusick D.O.   On: 07/30/2019 14:46   NM Cardiac Muga Rest  Result Date: 07/25/2019 CLINICAL DATA:  Breast cancer. Evaluate cardiac function in relation to chemotherapy. EXAM: NUCLEAR MEDICINE CARDIAC BLOOD POOL IMAGING (MUGA) TECHNIQUE: Cardiac multi-gated acquisition was performed at rest following intravenous injection of Tc-21mlabeled red blood cells. RADIOPHARMACEUTICALS:  22.8 mCi Tc-960mertechnetate in-vitro labeled red blood cells IV COMPARISON:  None FINDINGS: No  focal wall motion abnormality of the left ventricle. Calculated left ventricular ejection fraction equals 74 % IMPRESSION: Left ventricular ejection fraction equals74 %. Electronically Signed   By: StSuzy Bouchard.D.   On: 07/25/2019 15:16   [place]AST LTD UNI RIGHT INC AXILLA  Result Date: 06/30/2019 CLINICAL DATA:  Palpable lump in the right breast EXAM: DIGITAL DIAGNOSTIC BILATERAL MAMMOGRAM WITH CAD AND TOMO ULTRASOUND RIGHT BREAST COMPARISON:  Previous exam(s). ACR Breast Density Category b: There are scattered areas of fibroglandular density. FINDINGS: There is a spiculated mass in the region of the patient's palpable lump measuring 3.8 cm mammographically. No other suspicious findings are  identified in either breast. Mammographic images were processed with CAD. On physical exam, there is a palpable lump in the right breast at 11 o'clock, 10 cm from the nipple. Targeted ultrasound is performed, showing an irregular hypoechoic mass at 11 o'clock, 10 cm from the nipple measuring 2.8 x 2.4 x 2.6 cm. There is a single mildly abnormal node in the low right axilla with a cortex measuring up to 4.4 mm. No other suspicious findings. IMPRESSION: Highly suspicious right breast mass. Mildly abnormal right axillary node. No abnormalities on the left. RECOMMENDATION: Recommend ultrasound-guided biopsy of the right breast mass and a mildly abnormal right axillary node. I have discussed the findings and recommendations with the patient. If applicable, a reminder letter will be sent to the patient regarding the next appointment. BI-RADS CATEGORY  5: Highly suggestive of malignancy. Electronically Signed   By: DaDorise BullionII M.D   On: 06/30/2019 12:19   MM DIAG BREAST TOMO BILATERAL  Result Date: 06/30/2019 CLINICAL DATA:  Palpable lump in the right breast EXAM: DIGITAL DIAGNOSTIC BILATERAL MAMMOGRAM WITH CAD AND TOMO ULTRASOUND RIGHT BREAST COMPARISON:  Previous exam(s). ACR Breast Density Category b: There are scattered areas of fibroglandular density. FINDINGS: There is a spiculated mass in the region of the patient's palpable lump measuring 3.8 cm mammographically. No other suspicious findings are identified in either breast. Mammographic images were processed with CAD. On physical exam, there is a palpable lump in the right breast at 11 o'clock, 10 cm from the nipple. Targeted ultrasound is performed, showing an irregular hypoechoic mass at 11 o'clock, 10 cm from the nipple measuring 2.8 x 2.4 x 2.6 cm. There is a single mildly abnormal node  in the low right axilla with a cortex measuring up to 4.4 mm. No other suspicious findings. IMPRESSION: Highly suspicious right breast mass. Mildly abnormal right  axillary node. No abnormalities on the left. RECOMMENDATION: Recommend ultrasound-guided biopsy of the right breast mass and a mildly abnormal right axillary node. I have discussed the findings and recommendations with the patient. If applicable, a reminder letter will be sent to the patient regarding the next appointment. BI-RADS CATEGORY  5: Highly suggestive of malignancy. Electronically Signed   By: Dorise Bullion III M.D   On: 06/30/2019 12:19   MM CLIP PLACEMENT RIGHT  Result Date: 07/05/2019 CLINICAL DATA:  Ultrasound-guided core needle biopsies were performed of a suspicious palpable mass upper-outer quadrant right breast and an axillary lymph node with focal cortical thickening. EXAM: DIAGNOSTIC RIGHT MAMMOGRAM POST ULTRASOUND BIOPSIES COMPARISON:  Previous exam(s). FINDINGS: Mammographic images were obtained following ultrasound guided biopsy of a right breast mass in the upper-outer quadrant and ultrasound-guided biopsy of a right axillary lymph node. The Venus biopsy marking clip is in expected position at the site of the biopsied breast mass. HydroMARK biopsy clip is in the expected location of the biopsied lymph node. There are post biopsy changes surrounding the axillary biopsy clip. IMPRESSION: Appropriate positioning of the Venus and HydroMARK biopsy marking clips in the right breast and right axilla. Final Assessment: Post Procedure Mammograms for Marker Placement Electronically Signed   By: Curlene Dolphin M.D.   On: 07/05/2019 09:19   [place] RT BREAST BX W LOC DEV 1ST LESION IMG BX SPEC US GUIDE  Addendum Date: 07/06/2019   ADDENDUM REPORT: 07/06/2019 11:53 ADDENDUM: Pathology revealed GRADE III INVASIVE MAMMARY CARCINOMA, NO SPECIAL TYPE of the RIGHT breast at 10-11 o'clock, 10 cm from nipple. This was found to be concordant by Dr. Curlene Dolphin. Pathology revealed PREDOMINANTLY BLOOD AND FIBROADIPOSE TISSUE, WITH SCANT LYMPHOID TISSUE PRESENT of the RIGHT axillary lymph node. This was found to  be concordant by Dr. Curlene Dolphin. Correlate with sentinel lymph node biopsy. Pathology results were discussed with the patient by telephone. The patient reported doing well after the biopsy with tenderness at the site. Post biopsy instructions and care were reviewed and questions were answered. The patient was encouraged to call [facility] [place] [facility] for any additional concerns. Surgical and Oncologist referral will be arranged by Al Pimple RN and Tanya Nones RN of [place]. Pathology results reported by Stacie Acres RN on 07/06/2019. Electronically Signed   By: Curlene Dolphin M.D.   On: 07/06/2019 11:53   Result Date: 07/06/2019 CLINICAL DATA:  Ultrasound-guided core needle biopsy was recommended of a suspicious palpable mass in the upper-outer quadrant of the right breast. EXAM: ULTRASOUND GUIDED RIGHT BREAST CORE NEEDLE BIOPSY COMPARISON:  Previous exam(s). FINDINGS: I met with the patient and we discussed the procedure of ultrasound-guided biopsy, including benefits and alternatives. We discussed the high likelihood of a successful procedure. We discussed the risks of the procedure, including infection, bleeding, tissue injury, clip migration, and inadequate sampling. Informed written consent was given. The usual time-out protocol was performed immediately prior to the procedure. Lesion quadrant: Upper outer quadrant Using sterile technique and 1% Lidocaine as local anesthetic, under direct ultrasound visualization, a 12 gauge spring-loaded device was used to perform biopsy of a 2.8 cm palpable mass in the 10-11 o'clock axis of the right breast approximately 10 cm from the nipple using a lateral approach. At the conclusion of the procedure  Venus tissue marker clip was deployed into the biopsy cavity. Follow up 2 view mammogram was performed and dictated separately. IMPRESSION: Ultrasound guided biopsy of the right breast. No apparent  complications. Electronically Signed: By: Curlene Dolphin M.D. On: 07/05/2019 09:15   [place] RT BREAST BX W LOC DEV EA ADD LESION IMG BX SPEC US GUIDE  Addendum Date: 07/06/2019   ADDENDUM REPORT: 07/06/2019 11:56 ADDENDUM: Pathology revealed GRADE III INVASIVE MAMMARY CARCINOMA, NO SPECIAL TYPE of the RIGHT breast at 10-11 o'clock, 10 cm from nipple. This was found to be concordant by Dr. Curlene Dolphin. Pathology revealed PREDOMINANTLY BLOOD AND FIBROADIPOSE TISSUE, WITH SCANT LYMPHOID TISSUE PRESENT of the RIGHT axillary lymph node. This was found to be concordant by Dr. Curlene Dolphin. Correlate with sentinel lymph node biopsy. Pathology results were discussed with the patient by telephone. The patient reported doing well after the biopsy with tenderness at the site. Post biopsy instructions and care were reviewed and questions were answered. The patient was encouraged to call [facility] [place] [facility] for any additional concerns. Surgical and Oncologist referral will be arranged by Al Pimple RN and Tanya Nones RN of [place]. Pathology results reported by Stacie Acres RN on 07/06/2019. Electronically Signed   By: Curlene Dolphin M.D.   On: 07/06/2019 11:56   Result Date: 07/06/2019 CLINICAL DATA:  Ultrasound-guided core needle biopsy was recommended of a right axillary lymph node with cortical thickening. EXAM: ULTRASOUND GUIDED RIGHT AXILLA CORE NEEDLE BIOPSY COMPARISON:  Previous exam(s). FINDINGS: I met with the patient and we discussed the procedure of ultrasound-guided biopsy, including benefits and alternatives. We discussed the high likelihood of a successful procedure. We discussed the risks of the procedure, including infection, bleeding, tissue injury, clip migration, and inadequate sampling. Informed written consent was given. The usual time-out protocol was performed immediately prior to the procedure. Using sterile technique and 1%  Lidocaine as local anesthetic, under direct ultrasound visualization, a 14 gauge spring-loaded device was used to perform biopsy of an axillary lymph node with focal cortical thickening using a lateral approach. At the conclusion of the procedure [facility] tissue marker clip was deployed into the biopsy cavity. Follow up 2 view mammogram was performed and dictated separately. IMPRESSION: Ultrasound guided biopsy of a right axillary lymph node. No apparent complications. Electronically Signed: By: Curlene Dolphin M.D. On: 07/05/2019 09:16   IR IMAGING GUIDED PORT INSERTION  Result Date: 07/20/2019 CLINICAL DATA:  Right breast carcinoma and need for porta cath for chemotherapy. EXAM: IMPLANTED PORT A CATH PLACEMENT WITH ULTRASOUND AND FLUOROSCOPIC GUIDANCE ANESTHESIA/SEDATION: 1.0 mg IV Versed; 50 mcg IV Fentanyl Total Moderate Sedation Time:  28 minutes The patient's level of consciousness and physiologic status were continuously monitored during the procedure by Radiology nursing. Additional Medications: 2 g IV Ancef. FLUOROSCOPY TIME:  30 seconds. PROCEDURE: The procedure, risks, benefits, and alternatives were explained to the patient. Questions regarding the procedure were encouraged and answered. The patient understands and consents to the procedure. A time-out was performed prior to initiating the procedure. Ultrasound was utilized to confirm patency of the left internal jugular vein. The left neck and chest were prepped with chlorhexidine in a sterile fashion, and a sterile drape was applied covering the operative field. Maximum barrier sterile technique with sterile gowns and gloves were used for the procedure. Local anesthesia was provided with 1% lidocaine. After creating a small venotomy incision, a 21 gauge needle was advanced into the left internal jugular  vein under direct, real-time ultrasound guidance. Ultrasound image documentation was performed. After securing guidewire access, an 8 Fr dilator was  placed. A J-wire was kinked to measure appropriate catheter length. A subcutaneous port pocket was then created along the upper chest wall utilizing sharp and blunt dissection. Portable cautery was utilized. The pocket was irrigated with sterile saline. A single lumen power injectable port was chosen for placement. The 8 Fr catheter was tunneled from the port pocket site to the venotomy incision. The port was placed in the pocket. External catheter was trimmed to appropriate length based on guidewire measurement. At the venotomy, an 8 Fr peel-away sheath was placed over a guidewire. The catheter was then placed through the sheath and the sheath removed. Final catheter positioning was confirmed and documented with a fluoroscopic spot image. The port was accessed with a needle and aspirated and flushed with heparinized saline. The access needle was removed. The venotomy and port pocket incisions were closed with subcutaneous 3-0 Monocryl and subcuticular 4-0 Vicryl. Dermabond was applied to both incisions. COMPLICATIONS: COMPLICATIONS None FINDINGS: After catheter placement, the tip lies at the cavo-atrial junction. The catheter aspirates normally and is ready for immediate use. IMPRESSION: Placement of single lumen port a cath via left internal jugular vein. The catheter tip lies at the cavo-atrial junction. A power injectable port a cath was placed and is ready for immediate use. Electronically Signed   By: Aletta Edouard M.D.   On: 07/20/2019 16:28      ASSESSMENT & PLAN:  1. Malignant neoplasm of upper-outer quadrant of right breast in female, estrogen receptor negative ([place])   2. Encounter for antineoplastic chemotherapy   3. Uncontrolled type 2 diabetes mellitus with hyperglycemia (H[facility])   4. Other specified hypotension    cT2N0 grade 3 invasive mammary carcinoma.  ER negative, PR weakly positive (<=10%), HER-2 negative Status post 1 cycle of ddAC with growth factor support. Clinically patient  tolerates chemotherapy well. Labs are reviewed and discussed with patient Counts acceptable to proceed with cycle 3 ddAC with Fulphila on Day 2  #Borderline blood pressure today with 98/61.  Advised patient to check blood pressure and also keep a log. Advised patient to hold off blood pressure medication systolic blood pressures less than 100. I will give 1L NS today.  #Uncontrolled diabetes, recent A1c on 06/20/2019 was 8.2.  Patient is on Levemir, semaglutide.  Blood glucose is elevated at 272 today.  Advised patient to check blood glucose level 2-3 times a day and keep a log and see primary care provider for advice of optimization of glucose.  Patient previously had taken dexamethasone for 2 days after each chemo.  Has been on hold due to hypoglycemia.  All questions were answered. The patient knows to call the clinic with any problems questions or concerns.   Return of visit:  2 weeks.   Earlie Server, MD, PhD Hematology Oncology [facility] at [facility] Pager- [phone] 09/01/2019

## 2019-09-01 NOTE — Research (Signed)
Met with patient Dawn Alvarez at Dr. Collie Siad recommendation to discuss participation in the SWOG [phone] clinical trial. Informed that she is potentially eligible to participate in a clinical trial that will assess the amount and type of neuropathy, if any, that she experiences with her Taxol chemotherapy, which will start in about 4 weeks. Reviewed the types of assessments involved including the neuropen and tuning fork assessments as well as questionnaires and the time points for each. Stressed that her actual chemotherapy treatment will not change and that participation is strictly voluntary. Patient voiced interest in learning more. A copy of the informed consent form was given to the patient along with contact information for myself and Jeral Fruit, RN in the event that she or her daughter have any questions. Patient was alone during the discussion, and states she will give the consent form to her daughter to review at home. Plans made to meet with patient and her daughter in two weeks to talk more about the study and determine her interest in participating. Yolande Jolly, BSN, MHA, OCN 09/01/2019 9:40 AM  Met with Dawn Alvarez and her daughter this morning to discuss participation in the [facility] S1714 research study. Patient verified that she still has a copy of the informed consent form for the study at home, but states she has not yet talked with her daughter about the study. Reviewed purpose of the study with patient and daughter including the fact that she will soon be receiving Taxol as part of her chemotherapy and this drug can cause peripheral neuropathy. 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 Described the study procedures in detail, including the tests that would be performed using a neuropen and tuning fork on her dominant (left) side, as well as the questionnaires that are required. Also discussed the time points for these procedures and  informed her that we will complete the study procedures while she is in clinic and she will not be expected to make additional visits for the study. Explained that study participation is voluntary, and that she can withdraw at any time and for any reason, and that she will not be paid to participate. Potential risks and benefits were discussed with daughter's input. Patient voices interest in participation and her daughter is in agreement. Plans made to meet with patient and daughter again tomorrow following her scheduled injection to obtain written consent for study participation. Informed that after that, we will meet with her on the morning of her first Taxol infusion to perform baseline tests and complete questionnaires. Cards with contact information for myself and Jeral Fruit, RN were given to patient's daughter. [name] verified that she still has our contact information at home. Instructed both of them to call us if they develop any questions related to the study, and that there will also be time tomorrow prior to consenting for [place] to answer any questions they may have. Dr. Tasia Catchings was informed of patient's desire to participate. Yolande Jolly, BSN, MHA, OCN 09/15/2019 9:37 AM  Received notification from Dr. Collie Siad nurse that she is holding the patient's Cycle 4 AC and referring the patient to a cardiologist. Will hold off on study consent until initial Taxol infusion is re-scheduled. Yolande Jolly, BSN, MHA, OCN 09/15/2019 9:52 AM  Patient Dawn Alvarez returned to clinic yesterday and completed her Cycle 4 AC infusion after being cleared to continue chemotherapy by her Cardiologist. Spoke with Dawn Alvarez about her continued willingness to participate in the  SWOG [phone] clinical trial and she states she is still interested. Plans made to meet with the patient this afternoon to obtain written consent for protocol participation. Patient arrived and received her scheduled injection this afternoon,  then the informed consent form was reviewed with her making sure she had an understanding of the required study procedures and that she did not have any additional questions. Patient states that neither she nor her daughter have any questions. Patient is aware that participation is strictly voluntary and that she can withdraw at any time and for any reason. She is also aware that she will not be paid for participation. Reviewed the required study labs for Cycle 1 with patient as well as the optional study labs, including the fact that they will have to be drawn from her arm while she is receiving her infusion. Patient states she does not like needles and the thought of this is scary, therefore she declined participation in the optional correlative lab sub-study. Dawn Alvarez did provide written consent to participate in the SWOG S1714 study, signing Informed Consent Form for Protocol Version Date: 04/10/19 as well as the accompanying SCOR HIPAA form dated 07/31/17 and the ROI form for [facility]. Copies of all of the signed forms were provided to the patient along with contact information for myself and Jeral Fruit. Informed patient that I will meet with her first thing on the morning of her first Taxol treatment and we will collect study labs, complete questionnaires and complete the neuropathy assessment before she begins her Taxol infusion that day. Patient voices understanding of this process. Reminded patient that she or her daughter can call anytime if they have questions. Patient was alone today to actually sign the consent form; however her daughter was present last week for the informed consent form review and discussion. Yolande Jolly, BSN, MHA, OCN 09/22/2019 4:18 PM  Patient Dawn Alvarez meets all of the eligibility and none of the ineligibility criteria for the SWOG S1714 study. 2nd eligibility checked by Jeral Fruit, RN and Dr. Tasia Catchings agrees that the patient is eligible now since  she has been cleared by her cardiologist to move forward with Taxol therapy and has signed the patient registration worksheet. Yolande Jolly, BSN, MHA, OCN 09/30/19 1:13 PM  Patient Braylie Badami returned to clinic this morning for purpose of beginning her weekly Taxol treatment and having her baseline assessments completed for the [facility] S1714 research study. Per Dr. Tasia Catchings, the patient will not be treated today due to symptomatic hypotension. B/P was 98/61 and patient continues to report dizziness and near syncope at times. Dr. Tasia Catchings plans to administer hydration fluids today and call Cardiologist for adjustment of B/P meds. Will hold baseline assessments and labs until patient begins her weekly Taxol treatments - probably early next week. Yolande Jolly, BSN, MHA, OCN 10/06/2019 9:10 AM  Patient Jamielee Mchale returns to clinic for her initial Taxol infusion. Please see Research Encounter dated 10/11/2019 for study assessment details. Patient was found to meet all eligibility criteria and none of the ineligibility criteria and second review was previously completed by Jeral Fruit, RN. Dr. Tasia Catchings has also previously signed in agreement that the patient is eligible for the S1714 study. After baseline assessments were completed this morning, patient was registered to the [facility] S1714 Neuropathy research study via OPEN. Baseline pretreatment labs have also been collected per protocol, to be shipped out this morning. Yolande Jolly, BSN, MHA, OCN 10/11/2019 09:34 AM

## 2019-09-01 NOTE — Progress Notes (Signed)
Patient here for follow up. No concerns voiced.  °

## 2019-09-02 ENCOUNTER — Inpatient Hospital Stay: Payer: Medicare HMO

## 2019-09-02 DIAGNOSIS — Z794 Long term (current) use of insulin: Secondary | ICD-10-CM | POA: Diagnosis not present

## 2019-09-02 DIAGNOSIS — E119 Type 2 diabetes mellitus without complications: Secondary | ICD-10-CM | POA: Diagnosis not present

## 2019-09-02 DIAGNOSIS — C50411 Malignant neoplasm of upper-outer quadrant of right female breast: Secondary | ICD-10-CM

## 2019-09-02 DIAGNOSIS — Z87891 Personal history of nicotine dependence: Secondary | ICD-10-CM | POA: Diagnosis not present

## 2019-09-02 DIAGNOSIS — Z5111 Encounter for antineoplastic chemotherapy: Secondary | ICD-10-CM | POA: Diagnosis not present

## 2019-09-02 DIAGNOSIS — E11649 Type 2 diabetes mellitus with hypoglycemia without coma: Secondary | ICD-10-CM | POA: Diagnosis not present

## 2019-09-02 DIAGNOSIS — R55 Syncope and collapse: Secondary | ICD-10-CM | POA: Diagnosis not present

## 2019-09-02 DIAGNOSIS — Z5189 Encounter for other specified aftercare: Secondary | ICD-10-CM | POA: Diagnosis not present

## 2019-09-02 DIAGNOSIS — Z79899 Other long term (current) drug therapy: Secondary | ICD-10-CM | POA: Diagnosis not present

## 2019-09-02 MED ORDER — PEGFILGRASTIM-JMDB 6 MG/0.6ML ~~LOC~~ SOSY
6.0000 mg | PREFILLED_SYRINGE | Freq: Once | SUBCUTANEOUS | Status: AC
  Administered 2019-09-02: 13:00:00 6 mg via SUBCUTANEOUS
  Filled 2019-09-02: qty 0.6

## 2019-09-03 ENCOUNTER — Observation Stay
Admission: EM | Admit: 2019-09-03 | Discharge: 2019-09-04 | Disposition: A | Discharge status: Home / Self Care | Payer: Medicare HMO | Attending: Family Medicine | Admitting: Family Medicine

## 2019-09-03 ENCOUNTER — Other Ambulatory Visit: Payer: Self-pay

## 2019-09-03 ENCOUNTER — Emergency Department: Payer: Medicare HMO

## 2019-09-03 DIAGNOSIS — C50411 Malignant neoplasm of upper-outer quadrant of right female breast: Secondary | ICD-10-CM | POA: Diagnosis present

## 2019-09-03 DIAGNOSIS — R52 Pain, unspecified: Secondary | ICD-10-CM | POA: Diagnosis not present

## 2019-09-03 DIAGNOSIS — E1159 Type 2 diabetes mellitus with other circulatory complications: Secondary | ICD-10-CM | POA: Diagnosis present

## 2019-09-03 DIAGNOSIS — I10 Essential (primary) hypertension: Secondary | ICD-10-CM | POA: Diagnosis present

## 2019-09-03 DIAGNOSIS — I517 Cardiomegaly: Secondary | ICD-10-CM | POA: Insufficient documentation

## 2019-09-03 DIAGNOSIS — Z87891 Personal history of nicotine dependence: Secondary | ICD-10-CM | POA: Diagnosis not present

## 2019-09-03 DIAGNOSIS — I700 Atherosclerosis of aorta: Secondary | ICD-10-CM | POA: Insufficient documentation

## 2019-09-03 DIAGNOSIS — Z794 Long term (current) use of insulin: Secondary | ICD-10-CM | POA: Insufficient documentation

## 2019-09-03 DIAGNOSIS — Z7952 Long term (current) use of systemic steroids: Secondary | ICD-10-CM | POA: Insufficient documentation

## 2019-09-03 DIAGNOSIS — E119 Type 2 diabetes mellitus without complications: Secondary | ICD-10-CM | POA: Diagnosis not present

## 2019-09-03 DIAGNOSIS — N281 Cyst of kidney, acquired: Secondary | ICD-10-CM | POA: Diagnosis not present

## 2019-09-03 DIAGNOSIS — Z79899 Other long term (current) drug therapy: Secondary | ICD-10-CM | POA: Diagnosis not present

## 2019-09-03 DIAGNOSIS — R55 Syncope and collapse: Secondary | ICD-10-CM | POA: Diagnosis present

## 2019-09-03 DIAGNOSIS — D72829 Elevated white blood cell count, unspecified: Secondary | ICD-10-CM | POA: Insufficient documentation

## 2019-09-03 DIAGNOSIS — Z20822 Contact with and (suspected) exposure to covid-19: Secondary | ICD-10-CM | POA: Insufficient documentation

## 2019-09-03 DIAGNOSIS — I152 Hypertension secondary to endocrine disorders: Secondary | ICD-10-CM | POA: Diagnosis present

## 2019-09-03 DIAGNOSIS — E1165 Type 2 diabetes mellitus with hyperglycemia: Secondary | ICD-10-CM | POA: Diagnosis present

## 2019-09-03 DIAGNOSIS — E785 Hyperlipidemia, unspecified: Secondary | ICD-10-CM | POA: Diagnosis not present

## 2019-09-03 DIAGNOSIS — G9340 Encephalopathy, unspecified: Secondary | ICD-10-CM | POA: Diagnosis not present

## 2019-09-03 DIAGNOSIS — J449 Chronic obstructive pulmonary disease, unspecified: Secondary | ICD-10-CM | POA: Insufficient documentation

## 2019-09-03 DIAGNOSIS — R001 Bradycardia, unspecified: Principal | ICD-10-CM

## 2019-09-03 DIAGNOSIS — D251 Intramural leiomyoma of uterus: Secondary | ICD-10-CM | POA: Diagnosis not present

## 2019-09-03 DIAGNOSIS — E876 Hypokalemia: Secondary | ICD-10-CM | POA: Diagnosis not present

## 2019-09-03 DIAGNOSIS — R1084 Generalized abdominal pain: Secondary | ICD-10-CM | POA: Diagnosis not present

## 2019-09-03 DIAGNOSIS — R2689 Other abnormalities of gait and mobility: Secondary | ICD-10-CM | POA: Diagnosis not present

## 2019-09-03 DIAGNOSIS — R0902 Hypoxemia: Secondary | ICD-10-CM | POA: Diagnosis not present

## 2019-09-03 DIAGNOSIS — R1110 Vomiting, unspecified: Secondary | ICD-10-CM | POA: Diagnosis not present

## 2019-09-03 LAB — CBC WITH DIFFERENTIAL/PLATELET
Abs Immature Granulocytes: 6.66 10*3/uL — ABNORMAL HIGH (ref 0.00–0.07)
Basophils Absolute: 0.1 10*3/uL (ref 0.0–0.1)
Basophils Relative: 0 %
Eosinophils Absolute: 0.1 10*3/uL (ref 0.0–0.5)
Eosinophils Relative: 0 %
HCT: 28.2 % — ABNORMAL LOW (ref 36.0–46.0)
Hemoglobin: 9.7 g/dL — ABNORMAL LOW (ref 12.0–15.0)
Immature Granulocytes: 10 %
Lymphocytes Relative: 4 %
Lymphs Abs: 2.9 10*3/uL (ref 0.7–4.0)
MCH: 28.5 pg (ref 26.0–34.0)
MCHC: 34.4 g/dL (ref 30.0–36.0)
MCV: 82.9 fL (ref 80.0–100.0)
Monocytes Absolute: 1.1 10*3/uL — ABNORMAL HIGH (ref 0.1–1.0)
Monocytes Relative: 2 %
Neutro Abs: 58 10*3/uL — ABNORMAL HIGH (ref 1.7–7.7)
Neutrophils Relative %: 84 %
Platelets: 146 10*3/uL — ABNORMAL LOW (ref 150–400)
RBC: 3.4 MIL/uL — ABNORMAL LOW (ref 3.87–5.11)
RDW: 14.6 % (ref 11.5–15.5)
WBC: 68.9 10*3/uL (ref 4.0–10.5)
nRBC: 0 % (ref 0.0–0.2)

## 2019-09-03 LAB — URINALYSIS, COMPLETE (UACMP) WITH MICROSCOPIC
Bilirubin Urine: NEGATIVE
Glucose, UA: 50 mg/dL — AB
Hgb urine dipstick: NEGATIVE
Ketones, ur: NEGATIVE mg/dL
Leukocytes,Ua: NEGATIVE
Nitrite: NEGATIVE
Protein, ur: NEGATIVE mg/dL
Specific Gravity, Urine: 1.02 (ref 1.005–1.030)
Squamous Epithelial / HPF: NONE SEEN (ref 0–5)
pH: 5 (ref 5.0–8.0)

## 2019-09-03 LAB — COMPREHENSIVE METABOLIC PANEL
ALT: 12 U/L (ref 0–44)
AST: 14 U/L — ABNORMAL LOW (ref 15–41)
Albumin: 3.5 g/dL (ref 3.5–5.0)
Alkaline Phosphatase: 62 U/L (ref 38–126)
Anion gap: 11 (ref 5–15)
BUN: 27 mg/dL — ABNORMAL HIGH (ref 8–23)
CO2: 22 mmol/L (ref 22–32)
Calcium: 9.2 mg/dL (ref 8.9–10.3)
Chloride: 107 mmol/L (ref 98–111)
Creatinine, Ser: 1.01 mg/dL — ABNORMAL HIGH (ref 0.44–1.00)
GFR calc Af Amer: >60 mL/min (ref >60)
GFR calc non Af Amer: 59 mL/min — ABNORMAL LOW (ref >60)
Glucose, Bld: 253 mg/dL — ABNORMAL HIGH (ref 70–99)
Potassium: 3.4 mmol/L — ABNORMAL LOW (ref 3.5–5.1)
Sodium: 140 mmol/L (ref 135–145)
Total Bilirubin: 0.7 mg/dL (ref 0.3–1.2)
Total Protein: 6.4 g/dL — ABNORMAL LOW (ref 6.5–8.1)

## 2019-09-03 LAB — TROPONIN I (HIGH SENSITIVITY)
Troponin I (High Sensitivity): 20 ng/L — ABNORMAL HIGH (ref <18)
Troponin I (High Sensitivity): 14 ng/L (ref <18)

## 2019-09-03 LAB — GLUCOSE, CAPILLARY: Glucose-Capillary: 202 mg/dL — ABNORMAL HIGH (ref 70–99)

## 2019-09-03 LAB — MAGNESIUM: Magnesium: 1.8 mg/dL (ref 1.7–2.4)

## 2019-09-03 LAB — TSH: TSH: 0.908 u[IU]/mL (ref 0.350–4.500)

## 2019-09-03 MED ORDER — POTASSIUM CHLORIDE IN NACL 20-0.9 MEQ/L-% IV SOLN
INTRAVENOUS | Status: AC
  Filled 2019-09-03: qty 1000

## 2019-09-03 MED ORDER — POTASSIUM CHLORIDE 20 MEQ PO PACK
40.0000 meq | PACK | Freq: Once | ORAL | Status: AC
  Administered 2019-09-03: 23:00:00 40 meq via ORAL
  Filled 2019-09-03: qty 2

## 2019-09-03 MED ORDER — ACETAMINOPHEN 325 MG PO TABS: 650.0000 mg | ORAL_TABLET | Freq: Four times a day (QID) | ORAL | Status: DC | PRN

## 2019-09-03 MED ORDER — INSULIN DETEMIR 100 UNIT/ML ~~LOC~~ SOLN
20.0000 [IU] | Freq: Every day | SUBCUTANEOUS | Status: DC
  Administered 2019-09-04: 20 [IU] via SUBCUTANEOUS
  Filled 2019-09-03: qty 0.2

## 2019-09-03 MED ORDER — MORPHINE SULFATE (PF) 4 MG/ML IV SOLN
4.0000 mg | Freq: Once | INTRAVENOUS | Status: AC
  Administered 2019-09-03: 19:00:00 4 mg via INTRAVENOUS
  Filled 2019-09-03: qty 1

## 2019-09-03 MED ORDER — ONDANSETRON HCL 4 MG/2ML IJ SOLN: 4.0000 mg | Freq: Four times a day (QID) | INTRAMUSCULAR | Status: DC | PRN

## 2019-09-03 MED ORDER — ONDANSETRON HCL 4 MG PO TABS: 4.0000 mg | ORAL_TABLET | Freq: Four times a day (QID) | ORAL | Status: DC | PRN

## 2019-09-03 MED ORDER — SODIUM CHLORIDE 0.9 % IV BOLUS
1000.0000 mL | Freq: Once | INTRAVENOUS | Status: AC
  Administered 2019-09-03: 19:00:00 1000 mL via INTRAVENOUS

## 2019-09-03 MED ORDER — INSULIN ASPART 100 UNIT/ML ~~LOC~~ SOLN
0.0000 [IU] | Freq: Three times a day (TID) | SUBCUTANEOUS | Status: DC
  Administered 2019-09-04: 3 [IU] via SUBCUTANEOUS
  Administered 2019-09-04: 2 [IU] via SUBCUTANEOUS
  Filled 2019-09-03 (×2): qty 1

## 2019-09-03 MED ORDER — IOHEXOL 300 MG/ML  SOLN
100.0000 mL | Freq: Once | INTRAMUSCULAR | Status: AC | PRN
  Administered 2019-09-03: 20:00:00 100 mL via INTRAVENOUS

## 2019-09-03 MED ORDER — MORPHINE SULFATE (PF) 2 MG/ML IV SOLN
1.0000 mg | INTRAVENOUS | Status: DC | PRN
  Administered 2019-09-03: 23:00:00 1 mg via INTRAVENOUS
  Filled 2019-09-03: qty 1

## 2019-09-03 MED ORDER — ACETAMINOPHEN 650 MG RE SUPP: 650.0000 mg | Freq: Four times a day (QID) | RECTAL | Status: DC | PRN

## 2019-09-03 MED ORDER — HYDROCODONE-ACETAMINOPHEN 5-325 MG PO TABS: 1.0000 | ORAL_TABLET | ORAL | Status: DC | PRN

## 2019-09-03 MED ORDER — ENOXAPARIN SODIUM 40 MG/0.4ML ~~LOC~~ SOLN
40.0000 mg | SUBCUTANEOUS | Status: DC
Start: 2019-09-04 — End: 2019-09-04
  Administered 2019-09-04: 09:00:00 40 mg via SUBCUTANEOUS
  Filled 2019-09-03: qty 0.4

## 2019-09-03 MED ORDER — SODIUM CHLORIDE 0.9% FLUSH
3.0000 mL | Freq: Two times a day (BID) | INTRAVENOUS | Status: DC
  Administered 2019-09-03 – 2019-09-04 (×2): 3 mL via INTRAVENOUS

## 2019-09-03 MED ORDER — SENNOSIDES-DOCUSATE SODIUM 8.6-50 MG PO TABS
1.0000 | ORAL_TABLET | Freq: Every day | ORAL | Status: DC
  Filled 2019-09-03: qty 1

## 2019-09-03 MED ORDER — MAGNESIUM SULFATE IN D5W 1-5 GM/100ML-% IV SOLN
1.0000 g | Freq: Once | INTRAVENOUS | Status: AC
  Administered 2019-09-03: 23:00:00 1 g via INTRAVENOUS
  Filled 2019-09-03: qty 100

## 2019-09-03 NOTE — H&P (Signed)
History and Physical    Dawn Alvarez HFS:[id number] DOB: 07-22-1955 DOA: 09/03/2019  PCP: Lavera Guise, MD  Patient coming from: Home via EMS  I have personally briefly reviewed patient's old medical records in [place]  Chief Complaint: Syncope  HPI: Dawn Alvarez is a 64 y.o. female with medical history significant for right-sided breast cancer currently on chemotherapy, insulin-dependent type 2 diabetes, and hypertension who presents to the ED for evaluation after a syncopal episode.  Patient is currently undergoing chemotherapy and received treatment on 09/01/2019 with doxorubicin and Cytoxan.  On 09/02/2018 when she received an infusion of Fulphila.   Patient states evening of 09/03/2018 when she was cooking in the kitchen when she suddenly felt lightheaded and passed out.  She remembers becoming diaphoretic before falling down to the floor.  She states she had lost consciousness for about 15-20 minutes.  She did not have any significant injury.  She denied any associated chest pain, palpitations, dyspnea, cough, nausea, or vomiting.  She has been having some abdominal pain but denies any dysuria or diarrhea.  She reports normal-appearing bowel movement earlier today.  She denies any loss of bowel/bladder control.  She denies any muscle aches or new weakness in her arms or legs.  She denies any headache or change in vision.  She states she has been eating and drinking well recently.  She remembers waking up and feeling "swimmy headed."  Her children were with her and attempted to get her up but were unable to.  EMS were called and on their arrival she was noted to have a heart rate between 44-65 bpm and blood glucose level of 286.  ED Course:  Initial vitals showed BP 114/71, pulse 44, RR 19, SPO2 98% on room air, temp 97.9 Fahrenheit.  Labs are notable for WBC 68.9 (19.1 on 09/01/19), hemoglobin 9.7, platelets 146,000, potassium 3.4, magnesium 1.8, sodium 140, bicarb 22, BUN 27,  creatinine 1.01, serum glucose 253, high-sensitivity troponin I 20 >> 14, TSH 0.908.  SARS-CoV-2 PCR test was collected and pending.  CT head without contrast was negative for acute intracranial abnormality, stable chronic small vessel ischemia is noted when compared to prior.  CT abdomen/pelvis with contrast is negative for evidence of acute intra-abdominal abnormality, myometrial fibroids are noted.  Patient was given 1 L normal saline and IV morphine.  The hospitalist service was consulted to admit for further evaluation and management.  Review of Systems: All systems reviewed and are negative except as documented in history of present illness above.   Past Medical History:  Diagnosis Date  . Asthma   . Cancer ([place])    breast  . COPD exacerbation ([place]) 04/12/2016  . Diabetes mellitus without complication ([place])   . Hyperlipemia   . Hypertension     Past Surgical History:  Procedure Laterality Date  . BREAST BIOPSY Right 07/05/2019   [place] bx venus marker, path pending  . BREAST BIOPSY Right 07/05/2019   LN bx, hydromarker, path pending  . CESAREAN SECTION    . COLONOSCOPY WITH PROPOFOL N/A 09/11/2015   Procedure: COLONOSCOPY WITH PROPOFOL;  Surgeon: Lucilla Lame, MD;  Location: [facility] ENDOSCOPY;  Service: Endoscopy;  Laterality: N/A;  . IR IMAGING GUIDED PORT INSERTION  07/20/2019    Social History:  reports that she quit smoking about 3 months ago. Her smoking use included cigarettes. She has a 2.00 pack-year smoking history. She has never used smokeless tobacco. She reports current alcohol use of about 2.0 standard  drinks of alcohol per week. She reports that she does not use drugs.  No Known Allergies  Family History  Problem Relation Age of Onset  . Diabetes Mother   . Hypertension Mother   . Hypertension Father   . Diabetes Father      Prior to Admission medications   Medication Sig Start Date End Date Taking? Authorizing Provider  amLODipine (NORVASC) 10 MG tablet  Take 1 tablet (10 mg total) by mouth daily. 01/05/19   Kendell Bane, NP  bisoprolol-hydrochlorothiazide (ZIAC) 10-6.25 MG tablet Take 1 tablet by mouth daily. 08/25/19   [name], NP  dexamethasone (DECADRON) 4 MG tablet  08/25/19   [provider]  glucose blood (ACCU-CHEK SMARTVIEW) test strip 1 each by Other route as needed for other. Use as directed twice a day diag E11.65    [provider]  hydrALAZINE (APRESOLINE) 50 MG tablet Take 1 tablet (50 mg total) by mouth 3 (three) times daily. 01/05/19   Kendell Bane, NP  insulin detemir (LEVEMIR) 100 UNIT/ML injection Inject 0.4 mLs (40 Units total) into the skin daily. 06/14/19   [name], NP  Insulin Pen Needle (PEN NEEDLES) 31G X 8 MM MISC Use as directed with insulin E11.65 03/30/18   [name], NP  Lancets Misc. (ACCU-CHEK MULTICLIX LANCET DEV) KIT by Does not apply route. Use as directed twice a day diag E11.65    [provider]  lidocaine-prilocaine (EMLA) cream  08/25/19   [provider]  lisinopril (ZESTRIL) 20 MG tablet Take 1 tablet (20 mg total) by mouth daily. 04/25/19   Kendell Bane, NP  Omega-3 Fatty Acids (FISH OIL) 1000 MG CAPS Take 1,000 mg by mouth daily.  04/12/16   [provider]  prochlorperazine (COMPAZINE) 10 MG tablet Take 10 mg by mouth every 6 (six) hours as needed for nausea or vomiting.    [provider]  Semaglutide, 1 MG/DOSE, (OZEMPIC, 1 MG/DOSE,) 2 MG/1.5ML SOPN Inject 1 mg into the skin once a week. 03/09/19   Kendell Bane, NP  SIMBRINZA 1-0.2 % SUSP  08/15/19   [provider]    Physical Exam: Vitals:   09/03/19 2000 09/03/19 2008 09/03/19 2030 09/03/19 2100  BP: (!) 151/83  (!) 172/82 (!) 178/87  Pulse: 71 74 75 74  Resp: _0 Temp:      TempSrc:      SpO2: 98% 99% 100% 100%  Weight:      Height:       Constitutional: Resting supine in bed, NAD, calm, comfortable Eyes: PERRL, lids and  conjunctivae normal ENMT: Mucous membranes are moist. Posterior pharynx clear of any exudate or lesions.Normal dentition.  Neck: normal, supple, no masses. Respiratory: clear to auscultation bilaterally, no wheezing, no crackles. Normal respiratory effort. No accessory muscle use.  Cardiovascular: Regular rate and rhythm, no murmurs / rubs / gallops. No extremity edema. 2+ pedal pulses.  Port-A-Cath in place left chest wall. Abdomen: Mild generalized abdominal tenderness, no masses palpated. No hepatosplenomegaly. Bowel sounds positive.  Musculoskeletal: no clubbing / cyanosis. No joint deformity upper and lower extremities. Good ROM, no contractures. Normal muscle tone.  Skin: no rashes, lesions, ulcers. No induration Neurologic: CN 2-12 grossly intact. Sensation intact, Strength 5/5 in all 4.  Psychiatric: Normal judgment and insight. Alert and oriented x 3. Normal mood.   Labs on Admission: I have personally reviewed following labs and imaging studies  CBC: Recent Labs  Lab 09/01/19 0818 09/03/19 1825  WBC 19.1* 68.9*  NEUTROABS 12.2* 58.0*  HGB 10.3* 9.7*  HCT 29.8* 28.2*  MCV 82.1 82.9  PLT 139* 366*   Basic Metabolic Panel: Recent Labs  Lab 09/01/19 0818 09/03/19 1825  NA 135 140  K 3.8 3.4*  CL 101 107  CO2 25 22  GLUCOSE 272* 253*  BUN 22 27*  CREATININE 1.06* 1.01*  CALCIUM 9.1 9.2  MG  --  1.8   GFR: Estimated Creatinine Clearance: 55 mL/min (A) (by C-G formula based on SCr of 1.01 mg/dL (H)). Liver Function Tests: Recent Labs  Lab 09/01/19 0818 09/03/19 1825  AST 15 14*  ALT 14 12  ALKPHOS 64 62  BILITOT 0.3 0.7  PROT 6.7 6.4*  ALBUMIN 3.6 3.5   No results for input(s): LIPASE, AMYLASE in the last 168 hours. No results for input(s): AMMONIA in the last 168 hours. Coagulation Profile: No results for input(s): INR, PROTIME in the last 168 hours. Cardiac Enzymes: No results for input(s): CKTOTAL, CKMB, CKMBINDEX, TROPONINI in the last 168 hours. BNP  (last 3 results) No results for input(s): PROBNP in the last 8760 hours. HbA1C: No results for input(s): HGBA1C in the last 72 hours. CBG: No results for input(s): GLUCAP in the last 168 hours. Lipid Profile: No results for input(s): CHOL, HDL, LDLCALC, TRIG, CHOLHDL, LDLDIRECT in the last 72 hours. Thyroid Function Tests: Recent Labs    09/03/19 1825  TSH 0.908   Anemia Panel: No results for input(s): VITAMINB12, FOLATE, FERRITIN, TIBC, IRON, RETICCTPCT in the last 72 hours. Urine analysis:    Component Value Date/Time   COLORURINE YELLOW (A) 09/03/2019 1825   APPEARANCEUR HAZY (A) 09/03/2019 1825   APPEARANCEUR Clear 05/25/2013 1617   LABSPEC 1.020 09/03/2019 1825   LABSPEC 1.033 05/25/2013 1617   PHURINE 5.0 09/03/2019 1825   GLUCOSEU 50 (A) 09/03/2019 1825   GLUCOSEU 150 mg/dL 05/25/2013 1617   HGBUR NEGATIVE 09/03/2019 1825   BILIRUBINUR NEGATIVE 09/03/2019 1825   BILIRUBINUR Negative 05/25/2013 1617   KETONESUR NEGATIVE 09/03/2019 1825   PROTEINUR NEGATIVE 09/03/2019 1825   NITRITE NEGATIVE 09/03/2019 1825   LEUKOCYTESUR NEGATIVE 09/03/2019 1825   LEUKOCYTESUR Negative 05/25/2013 1617    Radiological Exams on Admission: CT Head Wo Contrast  Result Date: 09/03/2019 CLINICAL DATA:  Encephalopathy. EXAM: CT HEAD WITHOUT CONTRAST TECHNIQUE: Contiguous axial images were obtained from the base of the skull through the vertex without intravenous contrast. COMPARISON:  Head CT 07/30/2019 FINDINGS: Brain: Brain volume is normal for age. No intracranial hemorrhage, mass effect, or midline shift. No hydrocephalus. The basilar cisterns are patent. Unchanged periventricular and deep chronic small vessel ischemia. Punctate lacunar infarct in the right thalamus. No evidence of territorial infarct or acute ischemia. No extra-axial or intracranial fluid collection. Vascular: No hyperdense vessel. Skull: Normal. Negative for fracture or focal lesion. Sinuses/Orbits: Paranasal sinuses and  mastoid air cells are clear. The visualized orbits are unremarkable. Other: None. IMPRESSION: 1. No acute intracranial abnormality. 2. Stable chronic small vessel ischemia from prior. Electronically Signed   By: Keith Rake M.D.   On: 09/03/2019 19:53   CT ABDOMEN PELVIS W CONTRAST  Result Date: 09/03/2019 CLINICAL DATA:  Nausea vomiting. EXAM: CT ABDOMEN AND PELVIS WITH CONTRAST TECHNIQUE: Multidetector CT imaging of the abdomen and pelvis was performed using the standard protocol following bolus administration of intravenous contrast. CONTRAST:  15m OMNIPAQUE IOHEXOL 300 MG/ML  SOLN COMPARISON:  April 20, 2016 FINDINGS: Lower chest: No acute abnormality. Hepatobiliary:  No focal liver abnormality is seen. No gallstones, gallbladder wall thickening, or biliary dilatation. Pancreas: Unremarkable. No pancreatic ductal dilatation or surrounding inflammatory changes. Spleen: Normal in size without focal abnormality. Adrenals/Urinary Tract: Adrenal glands are unremarkable. Kidneys are normal, without renal calculi, solid lesion, or hydronephrosis. 2 cm right renal cyst. Bladder is unremarkable. Stomach/Bowel: Stomach is within normal limits. Appendix appears normal. No evidence of bowel wall thickening, distention, or inflammatory changes. Vascular/Lymphatic: Aortic atherosclerosis. No enlarged abdominal or pelvic lymph nodes. Reproductive: Numerous some calcified myometrial masses. Other: No abdominal wall hernia or abnormality. No abdominopelvic ascites. Musculoskeletal: L4-L5 spondylosis. IMPRESSION: 1. No evidence of acute abnormalities within the solid abdominal organs. 2. Myometrial fibroids. Aortic Atherosclerosis (ICD10-I70.0). Electronically Signed   By: Fidela Salisbury M.D.   On: 09/03/2019 19:54    EKG: Independently reviewed.  Sinus bradycardia, rate 41, no acute ischemic changes.  Rate is slower when compared to prior.  Assessment/Plan Principal Problem:   Syncope Active Problems:    Type II diabetes mellitus, uncontrolled ([place])   Hypertension associated with diabetes ([place])   Malignant neoplasm of upper-outer quadrant of right female breast ([place])   Sinus bradycardia   Hypokalemia  Dawn Alvarez is a 64 y.o. female with medical history significant for right-sided breast cancer currently on chemotherapy, insulin-dependent type 2 diabetes, and hypertension who is admitted after a syncopal episode.  Syncope: Suspect secondary to bradycardia versus vasovagal syncope. -Monitor on telemetry -Obtain echocardiogram -Obtain orthostatic vital signs -Gentle IV fluid hydration overnight -Hold home bisoprolol and amlodipine -Replete potassium and magnesium -PT/OT evaluation  Sinus bradycardia: Present on arrival, now sinus rhythm with heart rate in the 70s.  Suspect bradycardia related to doxorubicin and amlodipine/bisoprolol use. -Holding bisoprolol and amlodipine -Replete potassium magnesium -Obtain echocardiogram, monitor telemetry -TSH is 0.908 -High-sensitivity troponin negative x2  Hypokalemia: Repleting potassium and magnesium as above.  Holding HCTZ for now.  Recheck in a.m.  Leukocytosis: WBC 68.9 compared to 19.12 days ago.  Likely secondary to Fulphila given on 09/02/2019.  No obvious infection.  Continue to monitor.  Abdominal pain: CT abdomen/pelvis negative for acute etiology.  Suspect secondary to recent chemotherapy.  We will continue as needed pain control with hold parameters and bowel regimen.  Hypertension: Initially normotensive, now BP is elevated.  Holding home amlodipine, bisoprolol, and HCTZ as above.  Checking orthostatic vital signs, if negative may resume home lisinopril and hydralazine.  Right-sided breast cancer: Follows with oncology, Dr. Tasia Catchings.  Currently on chemotherapy with Cytoxan and doxorubicin.  Insulin-dependent type 2 diabetes: Continue reduced home dose Levemir plus sensitive SSI, holding home Ozempic.  DVT prophylaxis:  Lovenox Code Status: Full code, confirmed with patient Family Communication: Discussed with patient, she has discussed with family Disposition Plan: From home, likely discharge to home pending further syncope evaluation and PT/OT evaluation Consults called: None Admission status: Observation   Zada Finders MD [facility]  If 7PM-7AM, please contact night-coverage [email or web address]  09/03/2019, 10:08 PM

## 2019-09-03 NOTE — ED Triage Notes (Addendum)
Per EMS pt passed out and slid down wall to floor around an hour ago. Pt denies head/other injury. Per ems pt being treated for breast cancer and last radiation was Thursday. Per EMS BGL 286 and HR 44-65 Pt aox4, moaning. Pt c/o lower abdominal pain and dizziness

## 2019-09-03 NOTE — ED Notes (Signed)
Pt assisted with bedpan and repositioned in bed.

## 2019-09-03 NOTE — ED Provider Notes (Signed)
[facility] Emergency Department Provider Note  ____________________________________________   First MD Initiated Contact with Patient 09/03/19 1821     (approximate)  I have reviewed the triage vital signs and the nursing notes.   HISTORY  Chief Complaint Loss of Consciousness    HPI Dawn Alvarez is a 64 y.o. female with history of breast cancer, hypertension, hyper lipidemia, diabetes, here with syncopal episode.  The patient states that she has felt generally fatigued throughout the day today.  She is currently undergoing chemotherapy and just had an injection on Friday.  She states that she was at home today.  She had been standing for significant downtime.  She states she began to feel acutely short of breath, lightheaded, and dizzy.  She slumped to the ground and lost consciousness.  She continues to feel lightheaded and weak, like she has difficulty even moving her limbs.  No focal weakness.  No fevers or chills.  No history of previous symptoms similar to this.  She has received the same chemotherapy in the past without similar episodes.  No recent medication changes.  She does note some mild, diffuse abdominal pain.  This has been off and on intermittently, but is worse today.       Past Medical History:  Diagnosis Date  . Asthma   . Cancer ([place])    breast  . COPD exacerbation ([place]) 04/12/2016  . Diabetes mellitus without complication ([place])   . Hyperlipemia   . Hypertension     Patient Active Problem List   Diagnosis Date Noted  . Dehydration 08/04/2019  . Goals of care, counseling/discussion 07/27/2019  . Malignant neoplasm of upper-outer quadrant of right breast in female, estrogen receptor negative ([place]) 07/27/2019  . Malignant neoplasm of upper-outer quadrant of right female breast ([place]) 07/08/2019  . Mass of soft tissue of chest 06/18/2019  . Mass of axilla, left 06/18/2019  . Encounter for screening mammogram for malignant  neoplasm of breast 06/18/2019  . Visual changes 06/18/2019  . 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 Acute upper respiratory infection 04/15/2018  . Mixed hyperlipidemia 01/10/2018  . Dysuria 10/11/2017  . Headache, acute 06/24/2017  . Type II diabetes mellitus, uncontrolled ([place]) 06/23/2017  . Essential hypertension 06/23/2017  . Syncope 04/12/2016  . Dizziness and giddiness 04/12/2016  . Acute bronchitis 04/12/2016  . COPD exacerbation ([place]) 04/12/2016  . Tobacco abuse counseling 04/12/2016  . Atypical chest pain 04/11/2016  . Encounter for general adult medical examination with abnormal findings   . Benign neoplasm of sigmoid colon     Past Surgical History:  Procedure Laterality Date  . BREAST BIOPSY Right 07/05/2019   [place] bx venus marker, path pending  . BREAST BIOPSY Right 07/05/2019   LN bx, hydromarker, path pending  . CESAREAN SECTION    . COLONOSCOPY WITH PROPOFOL N/A 09/11/2015   Procedure: COLONOSCOPY WITH PROPOFOL;  Surgeon: Lucilla Lame, MD;  Location: [facility] ENDOSCOPY;  Service: Endoscopy;  Laterality: N/A;  . IR IMAGING GUIDED PORT INSERTION  07/20/2019    Prior to Admission medications   Medication Sig Start Date End Date Taking? Authorizing Provider  amLODipine (NORVASC) 10 MG tablet Take 1 tablet (10 mg total) by mouth daily. 01/05/19   Kendell Bane, NP  bisoprolol-hydrochlorothiazide (ZIAC) 10-6.25 MG tablet Take 1 tablet by mouth daily. 08/25/19   [name], NP  dexamethasone (DECADRON) 4 MG tablet  08/25/19   [provider]  glucose blood (ACCU-CHEK SMARTVIEW) test strip 1 each  by Other route as needed for other. Use as directed twice a day diag E11.65    [provider]  hydrALAZINE (APRESOLINE) 50 MG tablet Take 1 tablet (50 mg total) by mouth 3 (three) times daily. 01/05/19   Kendell Bane, NP  insulin detemir (LEVEMIR) 100 UNIT/ML injection Inject 0.4 mLs (40 Units total) into the skin daily. 06/14/19   [name], NP    Insulin Pen Needle (PEN NEEDLES) 31G X 8 MM MISC Use as directed with insulin E11.65 03/30/18   [name], NP  Lancets Misc. (ACCU-CHEK MULTICLIX LANCET DEV) KIT by Does not apply route. Use as directed twice a day diag E11.65    [provider]  lidocaine-prilocaine (EMLA) cream  08/25/19   [provider]  lisinopril (ZESTRIL) 20 MG tablet Take 1 tablet (20 mg total) by mouth daily. 04/25/19   Kendell Bane, NP  Omega-3 Fatty Acids (FISH OIL) 1000 MG CAPS Take 1,000 mg by mouth daily.  04/12/16   [provider]  prochlorperazine (COMPAZINE) 10 MG tablet Take 10 mg by mouth every 6 (six) hours as needed for nausea or vomiting.    [provider]  Semaglutide, 1 MG/DOSE, (OZEMPIC, 1 MG/DOSE,) 2 MG/1.5ML SOPN Inject 1 mg into the skin once a week. 03/09/19   Kendell Bane, NP  SIMBRINZA 1-0.2 % SUSP  08/15/19   [provider]    Allergies Patient has no known allergies.  Family History  Problem Relation Age of Onset  . Diabetes Mother   . Hypertension Mother   . Hypertension Father   . Diabetes Father     Social History Social History   Tobacco Use  . Smoking status: Former Smoker    Packs/day: 1.00    Years: 2.00    Pack years: 2.00    Types: Cigarettes    Quit date: 05/19/2019    Years since quitting: 0.2  . Smokeless tobacco: Never Used  Substance Use Topics  . Alcohol use: Yes    Alcohol/week: 2.0 standard drinks    Types: 2 Cans of beer per week    Comment: ocassionally   . Drug use: No    Review of Systems  Review of Systems  Constitutional: Positive for fatigue. Negative for fever.  HENT: Negative for congestion and sore throat.   Eyes: Negative for visual disturbance.  Respiratory: Negative for cough and shortness of breath.   Cardiovascular: Negative for chest pain.  Gastrointestinal: Positive for nausea. Negative for abdominal pain, diarrhea and vomiting.  Genitourinary: Negative for flank pain.   Musculoskeletal: Negative for back pain and neck pain.  Skin: Negative for rash and wound.  Neurological: Positive for syncope and weakness.  All other systems reviewed and are negative.    ____________________________________________  PHYSICAL EXAM:      VITAL SIGNS: ED Triage Vitals  Enc Vitals Group     BP 09/03/19 1812 114/71     Pulse Rate 09/03/19 1812 (!) 44     Resp 09/03/19 1812 19     Temp 09/03/19 1812 97.9 F (36.6 C)     Temp Source 09/03/19 1812 Oral     SpO2 09/03/19 1812 98 %     Weight 09/03/19 1813 178 lb 9.2 oz (81 kg)     Height 09/03/19 1813 '5\' 1"'  (1.549 m)     Head Circumference --      Peak Flow --      Pain Score 09/03/19 1813 7  Pain Loc --      Pain Edu? --      Excl. in [place]? --      Physical Exam Vitals and nursing note reviewed.  Constitutional:      General: She is not in acute distress.    Appearance: She is well-developed.  HENT:     Head: Normocephalic and atraumatic.  Eyes:     Conjunctiva/sclera: Conjunctivae normal.  Cardiovascular:     Rate and Rhythm: Regular rhythm. Bradycardia present.     Heart sounds: Normal heart sounds.  Pulmonary:     Effort: Pulmonary effort is normal. No respiratory distress.     Breath sounds: No wheezing.  Abdominal:     General: Abdomen is flat. There is no distension.     Tenderness: There is abdominal tenderness (Diffuse, primarily epigastric and periumbilical).  Musculoskeletal:     Cervical back: Neck supple.  Skin:    General: Skin is warm.     Capillary Refill: Capillary refill takes less than 2 seconds.     Findings: No rash.  Neurological:     Mental Status: She is alert and oriented to person, place, and time.     Motor: No abnormal muscle tone.       ____________________________________________   LABS (all labs ordered are listed, but only abnormal results are displayed)  Labs Reviewed  CBC WITH DIFFERENTIAL/PLATELET - Abnormal; Notable for the following components:       Result Value   WBC 68.9 (*)    RBC 3.40 (*)    Hemoglobin 9.7 (*)    HCT 28.2 (*)    Platelets 146 (*)    Neutro Abs 58.0 (*)    Monocytes Absolute 1.1 (*)    Abs Immature Granulocytes 6.66 (*)    All other components within normal limits  COMPREHENSIVE METABOLIC PANEL - Abnormal; Notable for the following components:   Potassium 3.4 (*)    Glucose, Bld 253 (*)    BUN 27 (*)    Creatinine, Ser 1.01 (*)    Total Protein 6.4 (*)    AST 14 (*)    GFR calc non Af Amer 59 (*)    All other components within normal limits  TROPONIN I (HIGH SENSITIVITY) - Abnormal; Notable for the following components:   Troponin I (High Sensitivity) 20 (*)    All other components within normal limits  MAGNESIUM  TSH  URINALYSIS, COMPLETE (UACMP) WITH MICROSCOPIC  TSH  TROPONIN I (HIGH SENSITIVITY)    ____________________________________________  EKG: Sinus bradycardia, ventricular rate 41.  QRS 130, QTc 445.  No acute ST elevations or depressions.  No ischemia or infarct.  Compared to prior, rate is significantly lower. ________________________________________  RADIOLOGY All imaging, including plain films, CT scans, and ultrasounds, independently reviewed by me, and interpretations confirmed via formal radiology reads.  ED MD interpretation:   CT head: No acute abnormality CT abdomen/pelvis: No acute abnormality  Official radiology report(s): CT Head Wo Contrast  Result Date: 09/03/2019 CLINICAL DATA:  Encephalopathy. EXAM: CT HEAD WITHOUT CONTRAST TECHNIQUE: Contiguous axial images were obtained from the base of the skull through the vertex without intravenous contrast. COMPARISON:  Head CT 07/30/2019 FINDINGS: Brain: Brain volume is normal for age. No intracranial hemorrhage, mass effect, or midline shift. No hydrocephalus. The basilar cisterns are patent. Unchanged periventricular and deep chronic small vessel ischemia. Punctate lacunar infarct in the right thalamus. No evidence of territorial  infarct or acute ischemia. No extra-axial or intracranial fluid collection. Vascular:  No hyperdense vessel. Skull: Normal. Negative for fracture or focal lesion. Sinuses/Orbits: Paranasal sinuses and mastoid air cells are clear. The visualized orbits are unremarkable. Other: None. IMPRESSION: 1. No acute intracranial abnormality. 2. Stable chronic small vessel ischemia from prior. Electronically Signed   By: Keith Rake M.D.   On: 09/03/2019 19:53   CT ABDOMEN PELVIS W CONTRAST  Result Date: 09/03/2019 CLINICAL DATA:  Nausea vomiting. EXAM: CT ABDOMEN AND PELVIS WITH CONTRAST TECHNIQUE: Multidetector CT imaging of the abdomen and pelvis was performed using the standard protocol following bolus administration of intravenous contrast. CONTRAST:  173m OMNIPAQUE IOHEXOL 300 MG/ML  SOLN COMPARISON:  April 20, 2016 FINDINGS: Lower chest: No acute abnormality. Hepatobiliary: No focal liver abnormality is seen. No gallstones, gallbladder wall thickening, or biliary dilatation. Pancreas: Unremarkable. No pancreatic ductal dilatation or surrounding inflammatory changes. Spleen: Normal in size without focal abnormality. Adrenals/Urinary Tract: Adrenal glands are unremarkable. Kidneys are normal, without renal calculi, solid lesion, or hydronephrosis. 2 cm right renal cyst. Bladder is unremarkable. Stomach/Bowel: Stomach is within normal limits. Appendix appears normal. No evidence of bowel wall thickening, distention, or inflammatory changes. Vascular/Lymphatic: Aortic atherosclerosis. No enlarged abdominal or pelvic lymph nodes. Reproductive: Numerous some calcified myometrial masses. Other: No abdominal wall hernia or abnormality. No abdominopelvic ascites. Musculoskeletal: L4-L5 spondylosis. IMPRESSION: 1. No evidence of acute abnormalities within the solid abdominal organs. 2. Myometrial fibroids. Aortic Atherosclerosis (ICD10-I70.0). Electronically Signed   By: DFidela SalisburyM.D.   On: 09/03/2019 19:54      ____________________________________________  PROCEDURES   Procedure(s) performed (including Critical Care):  Procedures  ____________________________________________  INITIAL IMPRESSION / MDM / [place]/ ED COURSE  As part of my medical decision making, I reviewed the following data within the e[place]otes reviewed and incorporated, Old chart reviewed, Notes from prior ED visits, and [place] Controlled Substance DOliviawas evaluated in Emergency Department on 09/03/2019 for the symptoms described in the history of present illness. She was evaluated in the context of the global COVID-19 pandemic, which necessitated consideration that the patient might be at risk for infection with the SARS-CoV-2 virus that causes COVID-19. Institutional protocols and algorithms that pertain to the evaluation of patients at risk for COVID-19 are in a state of rapid change based on information released by regulatory bodies including the CDC and federal and state organizations. These policies and algorithms were followed during the patient's care in the ED.  Some ED evaluations and interventions may be delayed as a result of limited staffing during the pandemic.*     Medical Decision Making: 64year old female here with syncopal episode.  On arrival, patient is significantly bradycardic, lightheaded, and slightly encephalopathic.  This slowly improved.  Lab work shows marked leukocytosis which I suspect is due to recently receiving fulphila.  No known structural heart disease.  Patient remains slightly weak, but heart rate is improving.  Given significant bradycardia, mildly elevated troponin, will admit for observation.  ____________________________________________  FINAL CLINICAL IMPRESSION(S) / ED DIAGNOSES  Final diagnoses:  Bradycardia  Syncope, unspecified syncope type     MEDICATIONS GIVEN DURING THIS VISIT:  Medications   sodium chloride 0.9 % bolus 1,000 mL (1,000 mLs Intravenous New Bag/Given 09/03/19 1837)  morphine 4 MG/ML injection 4 mg (4 mg Intravenous Given 09/03/19 1908)  iohexol (OMNIPAQUE) 300 MG/ML solution 100 mL (100 mLs Intravenous Contrast Given 09/03/19 1931)     ED Discharge Orders  None       Note:  This document was prepared using Dragon voice recognition software and may include unintentional dictation errors.   Duffy Bruce, MD 09/03/19 2110

## 2019-09-04 ENCOUNTER — Observation Stay (HOSPITAL_BASED_OUTPATIENT_CLINIC_OR_DEPARTMENT_OTHER)
Admit: 2019-09-04 | Discharge: 2019-09-04 | Disposition: A | Discharge status: Home / Self Care | Payer: Medicare HMO | Attending: Family Medicine | Admitting: Family Medicine

## 2019-09-04 DIAGNOSIS — R001 Bradycardia, unspecified: Secondary | ICD-10-CM | POA: Diagnosis not present

## 2019-09-04 DIAGNOSIS — Z171 Estrogen receptor negative status [ER-]: Secondary | ICD-10-CM

## 2019-09-04 DIAGNOSIS — E876 Hypokalemia: Secondary | ICD-10-CM

## 2019-09-04 DIAGNOSIS — R55 Syncope and collapse: Secondary | ICD-10-CM

## 2019-09-04 DIAGNOSIS — C50411 Malignant neoplasm of upper-outer quadrant of right female breast: Secondary | ICD-10-CM

## 2019-09-04 DIAGNOSIS — I10 Essential (primary) hypertension: Secondary | ICD-10-CM | POA: Diagnosis not present

## 2019-09-04 DIAGNOSIS — E1165 Type 2 diabetes mellitus with hyperglycemia: Secondary | ICD-10-CM

## 2019-09-04 DIAGNOSIS — E1159 Type 2 diabetes mellitus with other circulatory complications: Secondary | ICD-10-CM | POA: Diagnosis not present

## 2019-09-04 LAB — CBC WITH DIFFERENTIAL/PLATELET
Abs Immature Granulocytes: 6.05 10*3/uL — ABNORMAL HIGH (ref 0.00–0.07)
Basophils Absolute: 0.1 10*3/uL (ref 0.0–0.1)
Basophils Relative: 0 %
Eosinophils Absolute: 0.1 10*3/uL (ref 0.0–0.5)
Eosinophils Relative: 0 %
HCT: 29.9 % — ABNORMAL LOW (ref 36.0–46.0)
Hemoglobin: 9.9 g/dL — ABNORMAL LOW (ref 12.0–15.0)
Immature Granulocytes: 9 %
Lymphocytes Relative: 3 %
Lymphs Abs: 2.1 10*3/uL (ref 0.7–4.0)
MCH: 28 pg (ref 26.0–34.0)
MCHC: 33.1 g/dL (ref 30.0–36.0)
MCV: 84.7 fL (ref 80.0–100.0)
Monocytes Absolute: 0.7 10*3/uL (ref 0.1–1.0)
Monocytes Relative: 1 %
Neutro Abs: 56.7 10*3/uL — ABNORMAL HIGH (ref 1.7–7.7)
Neutrophils Relative %: 87 %
Platelets: 142 10*3/uL — ABNORMAL LOW (ref 150–400)
RBC: 3.53 MIL/uL — ABNORMAL LOW (ref 3.87–5.11)
RDW: 14.6 % (ref 11.5–15.5)
WBC: 65.7 10*3/uL (ref 4.0–10.5)
nRBC: 0 % (ref 0.0–0.2)

## 2019-09-04 LAB — HEMOGLOBIN A1C
Hgb A1c MFr Bld: 10.6 % — ABNORMAL HIGH (ref 4.8–5.6)
Mean Plasma Glucose: 257.52 mg/dL

## 2019-09-04 LAB — BASIC METABOLIC PANEL
Anion gap: 5 (ref 5–15)
BUN: 19 mg/dL (ref 8–23)
CO2: 27 mmol/L (ref 22–32)
Calcium: 9.3 mg/dL (ref 8.9–10.3)
Chloride: 110 mmol/L (ref 98–111)
Creatinine, Ser: 0.8 mg/dL (ref 0.44–1.00)
GFR calc Af Amer: >60 mL/min (ref >60)
GFR calc non Af Amer: >60 mL/min (ref >60)
Glucose, Bld: 202 mg/dL — ABNORMAL HIGH (ref 70–99)
Potassium: 4.6 mmol/L (ref 3.5–5.1)
Sodium: 142 mmol/L (ref 135–145)

## 2019-09-04 LAB — ECHOCARDIOGRAM COMPLETE
Height: 61 in
Weight: 2707.2 oz

## 2019-09-04 LAB — GLUCOSE, CAPILLARY
Glucose-Capillary: 170 mg/dL — ABNORMAL HIGH (ref 70–99)
Glucose-Capillary: 229 mg/dL — ABNORMAL HIGH (ref 70–99)

## 2019-09-04 LAB — SARS CORONAVIRUS 2 (TAT 6-24 HRS): SARS Coronavirus 2: NEGATIVE

## 2019-09-04 LAB — MAGNESIUM: Magnesium: 2.2 mg/dL (ref 1.7–2.4)

## 2019-09-04 LAB — HIV ANTIBODY (ROUTINE TESTING W REFLEX): HIV Screen 4th Generation wRfx: NONREACTIVE

## 2019-09-04 MED ORDER — CHLORHEXIDINE GLUCONATE CLOTH 2 % EX PADS
6.0000 | MEDICATED_PAD | Freq: Every day | CUTANEOUS | Status: DC
  Administered 2019-09-04: 09:00:00 6 via TOPICAL

## 2019-09-04 NOTE — Evaluation (Signed)
Physical Therapy Evaluation Patient Details Name: Dawn Alvarez MRN: [id number] DOB: 09-28-1955 Today's Date: 09/04/2019   History of Present Illness  Dawn Alvarez is a 73yoF who [facility] on 09/03/19 after was cooking in the kitchen when she suddenly felt lightheaded and passed out.  She remembers becoming diaphoretic before falling down to the floor.  She states she had lost consciousness for about 15-20 minutes. PMH: right-sided breast cancer currently on chemotherapy, insulin-dependent type 2 diabetes, and hypertension. Thus far orthostatics have been negative and hypertensive.  Clinical Impression  Pt admitted with above diagnosis. Pt currently with functional limitations due to the deficits listed below (see "PT Problem List"). Upon entry, pt in bed, awake and agreeable to participate. The pt is alert and oriented x4, pleasant, conversational, and generally a reserved historian. Noted absent orthostatic vitals less than 12 hours prior with RN. Independent bed mobility and transfers. Pt AMB into hallway, has noticeable gait changes related to acute instability of gait, voices dizziness at 74ft, worse at 146ft where PT helps pt to chair. BP established in sitting and then standing, BP flat, but then taken in standing again after 3ft AMB back to room, noted SBP drop ~53mm. Pt assisted to BR at supervision level for safety. Pt educated on BP issue and limiting upright activity based on self-monitoring of symptoms. Functional mobility assessment demonstrates increased effort/time requirements, poor tolerance, and need for physical assistance, whereas the patient performed these at a higher level of independence PTA. MD in room at end of evaluation, made aware of BP issue. Pt will benefit from skilled PT intervention to increase independence and safety with basic mobility in preparation for discharge to the venue listed below.       Follow Up Recommendations Home health PT;Supervision for  mobility/OOB    Equipment Recommendations  Rolling walker with 5" wheels    Recommendations for Other Services       Precautions / Restrictions Precautions Precautions: Fall Precaution Comments: delayed orthostatic BP with AMB Restrictions Weight Bearing Restrictions: No      Mobility  Bed Mobility Overal bed mobility: Independent                Transfers Overall transfer level: Independent Equipment used: None                Ambulation/Gait Ambulation/Gait assistance: Min guard Gait Distance (Feet): 140 Feet(unable to make around unit prior to worsening orthostatic intolerance; seated break, then return to room.) Assistive device: None Gait Pattern/deviations: Staggering right     General Gait Details: minimal unsteadiness difficult to see, but creates variability in gait speed in attempts to maximize stability.  Stairs            Wheelchair Mobility    Modified Rankin (Stroke Patients Only)       Balance Overall balance assessment: Needs assistance   Sitting balance-Leahy Scale: Normal     Standing balance support: No upper extremity supported;During functional activity Standing balance-Leahy Scale: Good Standing balance comment: balance is not at baseline                             Pertinent Vitals/Pain Pain Assessment: Faces Faces Pain Scale: Hurts a little bit Pain Location: ABD pain Pain Intervention(s): Limited activity within patient's tolerance;Monitored during session    [place] expects to be discharged to:: Private residence Living Arrangements: Children(DTR and grandson;) Available Help at Discharge: Family;Available 24 hours/day;Available PRN/intermittently(DTR  works FT,) Type of Home: [employer] Access: Stairs to enter Entrance Stairs-Rails: Left;Can reach [employer] of Steps: 6-7? Home Layout: One level Home Equipment: None      Prior Function Level of  Independence: Independent         Comments: able to perform community distances, but does not often     Hand Dominance        Extremity/Trunk Assessment   Upper Extremity Assessment Upper Extremity Assessment: Overall WFL for tasks assessed    Lower Extremity Assessment Lower Extremity Assessment: Overall WFL for tasks assessed       Communication      Cognition Arousal/Alertness: Awake/alert Behavior During Therapy: WFL for tasks assessed/performed Overall Cognitive Status: Within Functional Limits for tasks assessed                                 General Comments: somewhat sleep appearing, brief, soft spoken verbal reponses, minimally engaging.      General Comments      Exercises     Assessment/Plan    PT Assessment Patient needs continued PT services  PT Problem List Decreased activity tolerance;Decreased balance;Decreased mobility;Decreased knowledge of use of DME;Decreased knowledge of precautions       PT Treatment Interventions DME instruction;Balance training;Gait training;Functional mobility training;Stair training;Therapeutic activities;Therapeutic exercise    PT Goals (Current goals can be found in the Care Plan section)  Acute Rehab PT Goals Patient Stated Goal: Pt offers non PT Goal Formulation: With patient Time For Goal Achievement: 09/18/19 Potential to Achieve Goals: Good    Frequency Min 2X/week   Barriers to discharge        Co-evaluation               AM-PAC PT "6 Clicks" Mobility  Outcome Measure Help needed turning from your back to your side while in a flat bed without using bedrails?: None Help needed moving from lying on your back to sitting on the side of a flat bed without using bedrails?: None Help needed moving to and from a bed to a chair (including a wheelchair)?: None Help needed standing up from a chair using your arms (e.g., wheelchair or bedside chair)?: None Help needed to walk in hospital  room?: None Help needed climbing 3-5 steps with a railing? : A Little 6 Click Score: 23    End of Session Equipment Utilized During Treatment: Gait belt Activity Tolerance: Treatment limited secondary to medical complications (Comment) Patient left: in bed;with nursing/sitter in room;with bed alarm set   PT Visit Diagnosis: Unsteadiness on feet (R26.81);Other abnormalities of gait and mobility (R26.89);Dizziness and giddiness (R42)    Time: [id number] PT Time Calculation (min) (ACUTE ONLY): 23 min   Charges:   PT Evaluation $PT Eval Moderate Complexity: 1 Mod PT Treatments $Therapeutic Exercise: 8-22 mins        10:06 AM, 09/04/19 Etta Grandchild, PT, DPT Physical Therapist - [facility]  [phone] ([place])   [place] C 09/04/2019, 10:03 AM

## 2019-09-04 NOTE — Progress Notes (Signed)
  During AMB with PT, pt has gradual, progressive dizziness, instability of gait- AMB ceased in hallway near 260, assisted to chair. BP assessed in sitting, then standing, then after AMB back to room.    09/04/19 0947  Therapy Vitals  Patient Position (if appropriate) Orthostatic Vitals  Orthostatic Sitting  BP- Sitting 158/85  Pulse- Sitting 76  Orthostatic Standing at 0 minutes  BP- Standing at 0 minutes 156/74  Pulse- Standing at 0 minutes 86  Orthostatic Standing at 3 minutes  BP- Standing at 3 minutes 131/74 (after AMB back to room (still in standing for assessment))  Pulse- Standing at 3 minutes 91    9:53 AM, 09/04/19 Etta Grandchild, PT, DPT Physical Therapist - [facility]  [id number] [facility])

## 2019-09-04 NOTE — TOC Initial Note (Addendum)
Transition of Care [facility]) - Initial/Assessment Note    Patient Details  Name: ROSIE REIF MRN: [id number] Date of Birth: 09-Oct-1955  Transition of Care [facility]) CM/SW Contact:    Elliot Gurney Patrick, [place] Phone Number: 09/04/2019, 12:11 PM  Clinical Narrative:                 Patient is a 64 year old female who after was cooking in the kitchen when she suddenly felt lightheaded and passed out. Patient resides in her own home with her daughter. Patient to discharge home today with [facility] PT and OT through [facility]. Patient's daughter will provide transport home. Patient offered a rolling walker per PT recommendation, however patient and daughter refused need for the rolling walker.  Lajoya Dombek, LCSW Clinical Social Work [phone]   Expected Discharge Plan: [place] Barriers to Discharge: No Barriers Identified   Patient Goals and CMS Choice Patient states their goals for this hospitalization and ongoing recovery are:: "To get stronger because I had passed out when I got here" CMS Medicare.gov Compare Post Acute Care list provided to:: Patient Choice offered to / list presented to : Patient  Expected Discharge Plan and Services Expected Discharge Plan: [place] In-house Referral: Clinical Social Work   Post Acute Care Choice: [place] arrangements for the past 2 months: [place] Expected Discharge Date: 09/04/19                         HH Arranged: PT, OT [place] Agency: Other - See comment([facility]) Date [place]: 09/21/19 Time HH Agency Contacted: 1210 Representative spoke with at [place]: [place] Arrangements/Services Living arrangements for the past 2 months: [place] with:: Adult Children Patient language and need for interpreter reviewed:: Yes Do you feel safe going back to the place where you live?: Yes      Need for Family Participation in Patient Care: Yes  (Comment) Care giver support system in place?: Yes (comment)   Criminal Activity/Legal Involvement Pertinent to Current Situation/Hospitalization: No - Comment as needed  Activities of Daily Living Home Assistive Devices/Equipment: None ADL Screening (condition at time of admission) Patient's cognitive ability adequate to safely complete daily activities?: Yes Is the patient deaf or have difficulty hearing?: No Does the patient have difficulty seeing, even when wearing glasses/contacts?: No Does the patient have difficulty concentrating, remembering, or making decisions?: No Patient able to express need for assistance with ADLs?: Yes Does the patient have difficulty dressing or bathing?: No Independently performs ADLs?: Yes (appropriate for developmental age) Does the patient have difficulty walking or climbing stairs?: No Weakness of Legs: None Weakness of Arms/Hands: None  Permission Sought/Granted Permission sought to share information with : Family Supports Permission granted to share information with : Yes, Verbal Permission Granted        Permission granted to share info w Relationship: Secretary/administrator  Permission granted to share info w Contact Information: [phone]  Emotional Assessment Appearance:: Appears stated age Attitude/Demeanor/Rapport: Engaged Affect (typically observed): Accepting Orientation: : Oriented to Self, Oriented to Place, Oriented to  Time, Oriented to Situation Alcohol / Substance Use: Not Applicable Psych Involvement: No (comment)  Admission diagnosis:  Bradycardia [R00.1] Syncope [R55] Syncope, unspecified syncope type [R55] Patient Active Problem List   Diagnosis Date Noted  . Sinus bradycardia 09/03/2019  . Hypokalemia 09/03/2019  . Dehydration 08/04/2019  . Goals of care, counseling/discussion  07/27/2019  . Malignant neoplasm of upper-outer quadrant of right breast in female, estrogen receptor negative ([place]) 07/27/2019  .  Malignant neoplasm of upper-outer quadrant of right female breast ([place]) 07/08/2019  . Mass of soft tissue of chest 06/18/2019  . Mass of axilla, left 06/18/2019  . Encounter for screening mammogram for malignant neoplasm of breast 06/18/2019  . Visual changes 06/18/2019  . 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 Acute upper respiratory infection 04/15/2018  . Mixed hyperlipidemia 01/10/2018  . Dysuria 10/11/2017  . Headache, acute 06/24/2017  . Type II diabetes mellitus, uncontrolled ([place]) 06/23/2017  . Hypertension associated with diabetes ([place]) 06/23/2017  . Syncope 04/12/2016  . Dizziness and giddiness 04/12/2016  . Acute bronchitis 04/12/2016  . COPD exacerbation ([place]) 04/12/2016  . Tobacco abuse counseling 04/12/2016  . Atypical chest pain 04/11/2016  . Encounter for general adult medical examination with abnormal findings   . Benign neoplasm of sigmoid colon    PCP:  Lavera Guise, MD Pharmacy:   [facility] [address]), [place] ([place])  [address] Phone: [phone] Fax: [place], [place] - [place] [address] Phone: [phone] Fax: [phone]     Social Determinants of Health (SDOH) Interventions    Readmission Risk Interventions No flowsheet data found.

## 2019-09-04 NOTE — Evaluation (Signed)
Occupational Therapy Evaluation Patient Details Name: Dawn Alvarez MRN: [id number] DOB: 11-16-1955 Today's Date: 09/04/2019    History of Present Illness Dawn Alvarez is a 60yoF who [facility] on 09/[facility]19 after was cooking in the kitchen when she suddenly felt lightheaded and passed out.  She remembers becoming diaphoretic before falling down to the floor.  She states she had lost consciousness for about 15-20 minutes. PMH: right-sided breast cancer currently on chemotherapy, insulin-dependent type 2 diabetes, and hypertension. Thus far orthostatics have been negative and hypertensive.   Clinical Impression   Dawn Alvarez was seen for OT evaluation this date. Prior to hospital admission, pt was Independent c ADLs and functional mobility, pt does not drive. Pt lives with daughter (works full time) and 71 yo grandson (available 24/7). Pt presents to acute OT demonstrating impaired ADL performance and functional mobility 2/2 functional balance/endurance deficits and decreased safety awareness. Pt currently requires SBA for toileting, bathing, and standing ADLs. Pt would benefit from skilled OT to maximize safety and independence while minimizing falls risk and caregiver burden. Upon hospital discharge, recommend [place] c Supervision for OOB mobility to maximize pt safety and return to functional independence during meaningful occupations of daily life.     Follow Up Recommendations  Home health OT;Supervision - Intermittent;Other (comment)(Supervision for OOB/Mobility)    Equipment Recommendations  Other (comment)(grab bars in bath tub)    Recommendations for Other Services       Precautions / Restrictions Precautions Precautions: Fall Precaution Comments: delayed orthostatic BP with AMB Restrictions Weight Bearing Restrictions: No      Mobility Bed Mobility Overal bed mobility: Independent                Transfers Overall transfer level: Independent Equipment used: None              General transfer comment: Not tested, pt defers OOB activity stating desire to rest. OT visulaized pt ambulating in hall c PT this AM, pt appears to have some balance deficits - to be formally tested in context of ADLs next session.     Balance Overall balance assessment: Needs assistance   Sitting balance-Leahy Scale: Normal     Standing balance support: No upper extremity supported;During functional activity Standing balance-Leahy Scale: Good Standing balance comment: balance is not at baseline                           ADL either performed or assessed with clinical judgement   ADL Overall ADL's : Needs assistance/impaired                                       General ADL Comments: IND to setup meal (opening packages at bed level). IND simulated UBD and LBD at bed level (able to access toes in long sitting). Anticipate SBA for toileting and bathing 2/2 delayed orthostatics/ balance deficits c PT during ambulation this AM.     Vision         Perception     Praxis      Pertinent Vitals/Pain Pain Assessment: No/denies pain Faces Pain Scale: Hurts a little bit Pain Location: ABD pain Pain Intervention(s): Limited activity within patient's tolerance;Monitored during session     Hand Dominance     Extremity/Trunk Assessment Upper Extremity Assessment Upper Extremity Assessment: Overall WFL for tasks assessed   Lower Extremity Assessment Lower  Extremity Assessment: Overall WFL for tasks assessed       Communication Communication Communication: No difficulties   Cognition Arousal/Alertness: Awake/alert Behavior During Therapy: WFL for tasks assessed/performed Overall Cognitive Status: Within Functional Limits for tasks assessed                                 General Comments: Pt appears tired and occassional divided attention toward TV   General Comments       Exercises Exercises: Other exercises Other  Exercises Other Exercises: Pt educated re: falls prevention, night time toileting, DME recommendations, OT role, home/routine modicifations, energy conservation, and discharge recommendations Other Exercises: Simulated UBD/LBD and setup for self-feeding.    Shoulder Instructions      Home Living Family/patient expects to be discharged to:: Private residence Living Arrangements: Children(DTR and 29 yo grandson) Available Help at Discharge: Family;Available 24 hours/day;Available PRN/intermittently(DTR works fulltime, grandson home 24/7) Type of Home: House Home Access: Stairs to enter [employer] of Steps: 6-7? Entrance Stairs-Rails: Left;Can reach both;Right Home Layout: One level     Bathroom Shower/Tub: Teacher, early years/pre: Standard Bathroom Accessibility: Yes How Accessible: Accessible via walker Home Equipment: None          Prior Functioning/Environment Level of Independence: Independent        Comments: Pt reports sitting for LBD and prefers baths in tub stating "I am scared of the shower"        OT Problem List: Decreased activity tolerance;Impaired balance (sitting and/or standing);Decreased knowledge of use of DME or AE      OT Treatment/Interventions: Self-care/ADL training;Therapeutic exercise;Energy conservation;Balance training;Patient/family education    OT Goals(Current goals can be found in the care plan section) Acute Rehab OT Goals Patient Stated Goal: None stated OT Goal Formulation: With patient Time For Goal Achievement: 09/18/19 Potential to Achieve Goals: Good ADL Goals Pt Will Perform Lower Body Dressing: with modified independence;sit to/from stand Pt Will Perform Toileting - Clothing Manipulation and hygiene: with modified independence;sit to/from stand Pt Will Perform Tub/Shower Transfer: with supervision;ambulating;grab bars  OT Frequency: Min 1X/week   Barriers to D/C: Inaccessible home environment           Co-evaluation              AM-PAC OT "6 Clicks" Daily Activity     Outcome Measure Help from another person eating meals?: None Help from another person taking care of personal grooming?: None Help from another person toileting, which includes using toliet, bedpan, or urinal?: A Little Help from another person bathing (including washing, rinsing, drying)?: A Little Help from another person to put on and taking off regular upper body clothing?: None Help from another person to put on and taking off regular lower body clothing?: A Little 6 Click Score: 21   End of Session    Activity Tolerance: Patient limited by fatigue Patient left: in bed;with call bell/phone within reach  OT Visit Diagnosis: Other abnormalities of gait and mobility (R26.89);Dizziness and giddiness (R42)                Time: [id number] OT Time Calculation (min): 9 min Charges:  OT General Charges $OT Visit: 1 Visit OT Evaluation $OT Eval Low Complexity: 1 Low  Dessie Coma, M.S. OTR/L  09/04/19, 11:16 AM

## 2019-09-04 NOTE — Plan of Care (Signed)
Discharge instructions provided to pt.  All questions addressed.  Understanding verified through teach back.  Awaiting transportation home via POV.   Problem: Education: Goal: Knowledge of condition and prescribed therapy will improve Outcome: Adequate for Discharge   Problem: Cardiac: Goal: Will achieve and/or maintain adequate cardiac output Outcome: Adequate for Discharge   Problem: Physical Regulation: Goal: Complications related to the disease process, condition or treatment will be avoided or minimized Outcome: Adequate for Discharge   Problem: Education: Goal: Knowledge of General Education information will improve Description: Including pain rating scale, medication(s)/side effects and non-pharmacologic comfort measures Outcome: Adequate for Discharge   Problem: Health Behavior/Discharge Planning: Goal: Ability to manage health-related needs will improve Outcome: Adequate for Discharge   Problem: Clinical Measurements: Goal: Ability to maintain clinical measurements within normal limits will improve Outcome: Adequate for Discharge Goal: Will remain free from infection Outcome: Adequate for Discharge Goal: Diagnostic test results will improve Outcome: Adequate for Discharge Goal: Respiratory complications will improve Outcome: Adequate for Discharge Goal: Cardiovascular complication will be avoided Outcome: Adequate for Discharge   Problem: Activity: Goal: Risk for activity intolerance will decrease Outcome: Adequate for Discharge   Problem: Pain Managment: Goal: General experience of comfort will improve Outcome: Adequate for Discharge   Problem: Safety: Goal: Ability to remain free from injury will improve Outcome: Adequate for Discharge   Problem: Acute Rehab PT Goals(only PT should resolve) Goal: Pt Will Ambulate Outcome: Adequate for Discharge Goal: Pt Will Go Up/Down Stairs Outcome: Adequate for Discharge Goal: Pt Will Verbalize and Adhere to  Precautions While Description: PT Will Verbalize and Adhere to Precautions While Performing Mobility Outcome: Adequate for Discharge   Problem: Acute Rehab OT Goals (only OT should resolve) Goal: Pt. Will Perform Lower Body Dressing Outcome: Adequate for Discharge Goal: Pt. Will Perform Toileting-Clothing Manipulation Outcome: Adequate for Discharge Goal: Pt. Will Perform Tub/Shower Transfer Outcome: Adequate for Discharge

## 2019-09-04 NOTE — Discharge Summary (Signed)
Physician Discharge Summary  Dawn Alvarez STM:[id number] DOB: 1956-01-16 DOA: 09/03/2019  PCP: Lavera Guise, MD  Admit date: 09/03/2019 Discharge date: 09/04/2019  Admitted From: Home Disposition: Home   Recommendations for Outpatient Follow-up:  1. Follow up with PCP and/or oncology in the next week for repeat vital signs and antihypertensive management.   Home Health: PT, OT Equipment/Devices: Declined rolling walker Discharge Condition: Stable CODE STATUS: Full Diet recommendation: Heart healthy, carb-modified  Brief/Interim Summary: Dawn Alvarez is a 64 y.o. female with medical history significant for right-sided breast cancer currently on chemotherapy (doxorubicin, cytoxan last 4/1) with Fulphila last given 4/2, IDT2DM, HTN who presents to the ED for evaluation after a syncopal episode. She felt hot and diaphoretic for several minutes with lightheadedness before passing out, coming to on the ground without evident injury, pain, focal deficits, or tremor/loss of bowel/bladder function. She denied any associated chest pain, palpitations, dyspnea, cough, nausea, or vomiting. She has been having some abdominal pain but denies any dysuria or diarrhea, has been eating and drinking well recently.  EMS were called and on their arrival she was noted to have a heart rate between 44-65 bpm and blood glucose level of 286.  ED Course: Afebrile, BP 114/71, HR 44bpm, RR 19/min, SPO2 98% on room air.  Labs are notable for WBC 68.9 (19.1 on 09/01/19), hemoglobin 9.7, platelets 146,000, potassium 3.4, magnesium 1.8, sodium 140, bicarb 22, BUN 27, creatinine 1.01, serum glucose 253, high-sensitivity troponin I 20 >> 14, TSH 0.908.  SARS-CoV-2 PCR test was negative.  CT head without contrast was negative for acute intracranial abnormality, stable chronic small vessel ischemia is noted when compared to prior.  CT abdomen/pelvis with contrast is negative for evidence of acute intra-abdominal  abnormality, myometrial fibroids are noted.  Patient was given 1 L normal saline and IV morphine.   Hospital Course: Observed overnight with holding amlodipine, bisoprolol. No arrhythmias noted on telemetry, no valvular abnormalities or diminished LV or RV function on echocardiogram. No fevers or other evidence of systemic illness at this time. Worked with PT with no further syncopal events. Beta blocker and diuretic will be held until follow up later this week.  Discharge Diagnoses:  Principal Problem:   Syncope Active Problems:   Type II diabetes mellitus, uncontrolled ([place])   Hypertension associated with diabetes ([place])   Malignant neoplasm of upper-outer quadrant of right female breast ([facility])   Sinus bradycardia   Hypokalemia  Syncope: Suspect secondary to bradycardia versus vasovagal syncope. No echocardiographic abnormalities to explain syncope, sinus bradycardia is still intermittently noted but much improved from admission.  - Precautions reviewed with patient, hold diuretic and beta blocker until follow up.   Sinus bradycardia: Possibly related to doxorubicin and beta blocker. HR has come up since admission. Troponin not indicative of ischemia, TSH 0.908. Normal appearance of echocardiogram.  - Continue holding beta blocker.   Hypokalemia: Resolved with replacement. - Holding HCTZ.  Leukocytosis: WBC 68.9 compared to 19.1 2 days ago.  Likely secondary to Fulphila given on 09/02/2019.  No obvious infection.   - Continue to monitor.  Abdominal pain: CT abdomen/pelvis negative for acute etiology.  Suspect secondary to recent chemotherapy.   - Continue symptomatic management.   Hypertension: - Holding home bisoprolol and HCTZ as above.  Right-sided breast cancer: Follows with oncology, Dr. Tasia Catchings.  Currently on chemotherapy with cytoxan and doxorubicin.  Insulin-dependent type 2 diabetes: Continue home medications. No hypoglycemia noted.  Discharge Instructions Discharge  Instructions    Discharge  instructions   Complete by: As directed    You were admitted for observation after an episode of syncope (passing out). This may have been due to low blood pressure and/or low heart rate. There have been no further episodes while here, and your heart rhythm appears stable.  - Your blood pressure does ultimately decrease when you're standing/walking, so it is recommended that you have someone with you for the next few days when you're getting up.  - STOP taking bisoprolol-HCTZ - Follow up with your primary doctor and/or oncologist later this week to recheck your blood pressure and continue management.     Allergies as of 09/04/2019   No Known Allergies     Medication List    STOP taking these medications   bisoprolol-hydrochlorothiazide 10-6.25 MG tablet Commonly known as: Ziac     TAKE these medications   Accu-Chek Multiclix Lancet Dev Kit by Does not apply route. Use as directed twice a day diag E11.65   Accu-Chek SmartView test strip Generic drug: glucose blood 1 each by Other route as needed for other. Use as directed twice a day diag E11.65   amLODipine 10 MG tablet Commonly known as: NORVASC Take 1 tablet (10 mg total) by mouth daily.   brimonidine 0.2 % ophthalmic solution Commonly known as: ALPHAGAN Place 1 drop into both eyes 2 (two) times daily.   dexamethasone 4 MG tablet Commonly known as: DECADRON   Fish Oil 1000 MG Caps Take 1,000 mg by mouth daily.   hydrALAZINE 50 MG tablet Commonly known as: APRESOLINE Take 1 tablet (50 mg total) by mouth 3 (three) times daily.   insulin detemir 100 UNIT/ML injection Commonly known as: LEVEMIR Inject 0.4 mLs (40 Units total) into the skin daily.   lidocaine-prilocaine cream Commonly known as: EMLA   lisinopril 20 MG tablet Commonly known as: ZESTRIL Take 1 tablet (20 mg total) by mouth daily.   Ozempic (1 MG/DOSE) 2 MG/1.5ML Sopn Generic drug: Semaglutide (1 MG/DOSE) Inject 1 mg into  the skin once a week.   Pen Needles 31G X 8 MM Misc Use as directed with insulin E11.65   prochlorperazine 10 MG tablet Commonly known as: COMPAZINE Take 10 mg by mouth every 6 (six) hours as needed for nausea or vomiting.   Simbrinza 1-0.2 % Susp Generic drug: Brinzolamide-Brimonidine            Durable Medical Equipment  (From admission, onward)         Start     Ordered   09/04/19 1133  For home use only DME Walker rolling  Once    Question Answer Comment  Walker: With 5 Inch Wheels   Patient needs a walker to treat with the following condition Gait instability      09/04/19 1134         Follow-up Information    Lavera Guise, MD. Schedule an appointment as soon as possible for a visit.   Specialty: Internal Medicine Contact information: [address] [address] [phone]          No Known Allergies  Consultations:  None  Procedures/Studies: CT Head Wo Contrast  Result Date: 09/03/2019 CLINICAL DATA:  Encephalopathy. EXAM: CT HEAD WITHOUT CONTRAST TECHNIQUE: Contiguous axial images were obtained from the base of the skull through the vertex without intravenous contrast. COMPARISON:  Head CT 07/30/2019 FINDINGS: Brain: Brain volume is normal for age. No intracranial hemorrhage, mass effect, or midline shift. No hydrocephalus. The basilar cisterns are  patent. Unchanged periventricular and deep chronic small vessel ischemia. Punctate lacunar infarct in the right thalamus. No evidence of territorial infarct or acute ischemia. No extra-axial or intracranial fluid collection. Vascular: No hyperdense vessel. Skull: Normal. Negative for fracture or focal lesion. Sinuses/Orbits: Paranasal sinuses and mastoid air cells are clear. The visualized orbits are unremarkable. Other: None. IMPRESSION: 1. No acute intracranial abnormality. 2. Stable chronic small vessel ischemia from prior. Electronically Signed   By: Keith Rake M.D.   On: 09/03/2019  19:53   CT ABDOMEN PELVIS W CONTRAST  Result Date: 09/03/2019 CLINICAL DATA:  Nausea vomiting. EXAM: CT ABDOMEN AND PELVIS WITH CONTRAST TECHNIQUE: Multidetector CT imaging of the abdomen and pelvis was performed using the standard protocol following bolus administration of intravenous contrast. CONTRAST:  150m OMNIPAQUE IOHEXOL 300 MG/ML  SOLN COMPARISON:  April 20, 2016 FINDINGS: Lower chest: No acute abnormality. Hepatobiliary: No focal liver abnormality is seen. No gallstones, gallbladder wall thickening, or biliary dilatation. Pancreas: Unremarkable. No pancreatic ductal dilatation or surrounding inflammatory changes. Spleen: Normal in size without focal abnormality. Adrenals/Urinary Tract: Adrenal glands are unremarkable. Kidneys are normal, without renal calculi, solid lesion, or hydronephrosis. 2 cm right renal cyst. Bladder is unremarkable. Stomach/Bowel: Stomach is within normal limits. Appendix appears normal. No evidence of bowel wall thickening, distention, or inflammatory changes. Vascular/Lymphatic: Aortic atherosclerosis. No enlarged abdominal or pelvic lymph nodes. Reproductive: Numerous some calcified myometrial masses. Other: No abdominal wall hernia or abnormality. No abdominopelvic ascites. Musculoskeletal: L4-L5 spondylosis. IMPRESSION: 1. No evidence of acute abnormalities within the solid abdominal organs. 2. Myometrial fibroids. Aortic Atherosclerosis (ICD10-I70.0). Electronically Signed   By: DFidela SalisburyM.D.   On: 09/03/2019 19:54   ECHOCARDIOGRAM COMPLETE  Result Date: 09/04/2019    ECHOCARDIOGRAM REPORT   Patient Name:   Dawn CORDONEDate of Exam: 09/04/2019 Medical Rec #:  [id number]       Height:       61.0 in Accession #:    [id number]      Weight:       169.2 lb Date of Birth:  828-Jun-1957       BSA:          1.759 m Patient Age:    89years         BP:           175/83 mmHg Patient Gender: F                HR:           67 bpm. Exam Location:  [facility] Procedure: 2D  Echo Indications:     Syncope 780.2/ R55  History:         Patient has no prior history of Echocardiogram examinations.  Sonographer:     TArville GoRDCS Referring Phys:  [id number] Phys: TIda RogueMD IMPRESSIONS  1. Left ventricular ejection fraction, by estimation, is 60 to 65%. The left ventricle has normal function. The left ventricle has no regional wall motion abnormalities. There is mild left ventricular hypertrophy. Left ventricular diastolic parameters were normal.  2. Right ventricular systolic function is normal. The right ventricular size is normal. Tricuspid regurgitation signal is inadequate for assessing PA pressure. FINDINGS  Left Ventricle: Left ventricular ejection fraction, by estimation, is 60 to 65%. The left ventricle has normal function. The left ventricle has no regional wall motion abnormalities. The left ventricular internal cavity size was normal in size. There is  mild left  ventricular hypertrophy. Left ventricular diastolic parameters were normal. Right Ventricle: The right ventricular size is normal. No increase in right ventricular wall thickness. Right ventricular systolic function is normal. Tricuspid regurgitation signal is inadequate for assessing PA pressure. Left Atrium: Left atrial size was normal in size. Right Atrium: Right atrial size was normal in size. Pericardium: There is no evidence of pericardial effusion. Mitral Valve: The mitral valve is normal in structure. Normal mobility of the mitral valve leaflets. Trivial mitral valve regurgitation. No evidence of mitral valve stenosis. Tricuspid Valve: The tricuspid valve is normal in structure. Tricuspid valve regurgitation is not demonstrated. No evidence of tricuspid stenosis. Aortic Valve: The aortic valve is normal in structure. Aortic valve regurgitation is not visualized. No aortic stenosis is present. Aortic valve mean gradient measures 7.0 mmHg. Aortic valve peak gradient measures 13.5 mmHg.  Aortic valve area, by VTI measures 1.48 cm. Pulmonic Valve: The pulmonic valve was normal in structure. Pulmonic valve regurgitation is not visualized. No evidence of pulmonic stenosis. Aorta: The aortic root is normal in size and structure. Venous: The inferior vena cava is normal in size with greater than 50% respiratory variability, suggesting right atrial pressure of 3 mmHg. IAS/Shunts: No atrial level shunt detected by color flow Doppler.  LEFT VENTRICLE PLAX 2D LVIDd:         4.11 cm  Diastology LVIDs:         2.51 cm  LV e' lateral:   9.25 cm/s LV PW:         1.35 cm  LV E/e' lateral: 11.8 LV IVS:        1.17 cm  LV e' medial:    7.51 cm/s LVOT diam:     1.90 cm  LV E/e' medial:  14.5 LV SV:         65 LV SV Index:   37 LVOT Area:     2.84 cm  RIGHT VENTRICLE RV Basal diam:  2.39 cm RV S prime:     19.00 cm/s TAPSE (M-mode): 2.1 cm LEFT ATRIUM             Index       RIGHT ATRIUM           Index LA diam:        3.30 cm 1.88 cm/m  RA Area:     10.60 cm LA Vol (A2C):   41.0 ml 23.31 ml/m RA Volume:   22.90 ml  13.02 ml/m LA Vol (A4C):   40.2 ml 22.85 ml/m LA Biplane Vol: 42.6 ml 24.22 ml/m  AORTIC VALVE                    PULMONIC VALVE AV Area (Vmax):    1.59 cm     PV Vmax:       0.84 m/s AV Area (Vmean):   1.59 cm     PV Peak grad:  2.8 mmHg AV Area (VTI):     1.48 cm AV Vmax:           184.00 cm/s AV Vmean:          117.000 cm/s AV VTI:            0.441 m AV Peak Grad:      13.5 mmHg AV Mean Grad:      7.0 mmHg LVOT Vmax:         103.00 cm/s LVOT Vmean:        65.500 cm/s LVOT VTI:  0.230 m LVOT/AV VTI ratio: 0.52  AORTA Ao Root diam: 2.60 cm MITRAL VALVE MV Area (PHT): 3.12 cm     SHUNTS MV Decel Time: 243 msec     Systemic VTI:  0.23 m MV E velocity: 109.00 cm/s  Systemic Diam: 1.90 cm MV A velocity: 85.90 cm/s MV E/A ratio:  1.27 Ida Rogue MD Electronically signed by Ida Rogue MD Signature Date/Time: 09/04/2019/1:50:56 PM    Final     Echocardiogram 09/04/2019: 1. Left  ventricular ejection fraction, by estimation, is 60 to 65%. The  left ventricle has normal function. The left ventricle has no regional  wall motion abnormalities. There is mild left ventricular hypertrophy.  Left ventricular diastolic parameters  were normal.  2. Right ventricular systolic function is normal. The right ventricular  size is normal. Tricuspid regurgitation signal is inadequate for assessing  PA pressure.   Subjective: Feels fine this morning. Walked around unit with PT and felt somewhat lightheaded without dizziness, N/V. Eating/drinking ok. Denies chest pain, palpitations. Has had no leg swelling, orthopnea, bleeding/bruising. No fevers.  Discharge Exam: Vitals:   09/04/19 0921 09/04/19 1114  BP: 136/75 (!) 168/80  Pulse: 86 71  Resp:  17  Temp:  97.7 F (36.5 C)  SpO2: 97% 99%   General: Pt is alert, awake, not in acute distress Cardiovascular: Regular with rate70-80's, S1/S2 +, no rubs, no gallops Respiratory: CTA bilaterally, no wheezing, no rhonchi Abdominal: Soft, NT, ND, bowel sounds + Extremities: No significant edema, no cyanosis Neuro: Alert, oriented, no focal deficits.  Labs: Basic Metabolic Panel: Recent Labs  Lab 09/01/19 0818 09/03/19 1825 09/04/19 0505  NA 135 140 142  K 3.8 3.4* 4.6  CL 101 107 110  CO2 '25 22 27  ' GLUCOSE 272* 253* 202*  BUN 22 27* 19  CREATININE 1.06* 1.01* 0.80  CALCIUM 9.1 9.2 9.3  MG  --  1.8 2.2   Liver Function Tests: Recent Labs  Lab 09/01/19 0818 09/03/19 1825  AST 15 14*  ALT 14 12  ALKPHOS 64 62  BILITOT 0.3 0.7  PROT 6.7 6.4*  ALBUMIN 3.6 3.5   No results for input(s): LIPASE, AMYLASE in the last 168 hours. No results for input(s): AMMONIA in the last 168 hours. CBC: Recent Labs  Lab 09/01/19 0818 09/03/19 1825 09/04/19 0505  WBC 19.1* 68.9* 65.7*  NEUTROABS 12.2* 58.0* 56.7*  HGB 10.3* 9.7* 9.9*  HCT 29.8* 28.2* 29.9*  MCV 82.1 82.9 84.7  PLT 139* 146* 142*   Cardiac Enzymes: No  results for input(s): CKTOTAL, CKMB, CKMBINDEX, TROPONINI in the last 168 hours. BNP: Invalid input(s): POCBNP CBG: Recent Labs  Lab 09/03/19 2259 09/04/19 0721 09/04/19 1113  GLUCAP 202* 170* 229*   D-Dimer No results for input(s): DDIMER in the last 72 hours. Hgb A1c No results for input(s): HGBA1C in the last 72 hours. Lipid Profile No results for input(s): CHOL, HDL, LDLCALC, TRIG, CHOLHDL, LDLDIRECT in the last 72 hours. Thyroid function studies Recent Labs    09/03/19 1825  TSH 0.908   Anemia work up No results for input(s): VITAMINB12, FOLATE, FERRITIN, TIBC, IRON, RETICCTPCT in the last 72 hours. Urinalysis    Component Value Date/Time   COLORURINE YELLOW (A) 09/03/2019 1825   APPEARANCEUR HAZY (A) 09/03/2019 1825   APPEARANCEUR Clear 05/25/2013 1617   LABSPEC 1.020 09/03/2019 1825   LABSPEC 1.033 05/25/2013 1617   PHURINE 5.0 09/03/2019 1825   GLUCOSEU 50 (A) 09/03/2019 1825   GLUCOSEU 150 mg/dL 05/25/2013 1617  HGBUR NEGATIVE 09/03/2019 1825   BILIRUBINUR NEGATIVE 09/03/2019 1825   BILIRUBINUR Negative 05/25/2013 1617   KETONESUR NEGATIVE 09/03/2019 1825   PROTEINUR NEGATIVE 09/03/2019 1825   NITRITE NEGATIVE 09/03/2019 1825   LEUKOCYTESUR NEGATIVE 09/03/2019 1825   LEUKOCYTESUR Negative 05/25/2013 1617    Microbiology Recent Results (from the past 240 hour(s))  SARS CORONAVIRUS 2 (TAT 6-24 HRS) Nasopharyngeal Nasopharyngeal Swab     Status: None   Collection Time: 09/03/19  6:25 PM   Specimen: Nasopharyngeal Swab  Result Value Ref Range Status   SARS Coronavirus 2 NEGATIVE NEGATIVE Final    Comment: (NOTE) SARS-CoV-2 target nucleic acids are NOT DETECTED. The SARS-CoV-2 RNA is generally detectable in upper and lower respiratory specimens during the acute phase of infection. Negative results do not preclude SARS-CoV-2 infection, do not rule out co-infections with other pathogens, and should not be used as the sole basis for treatment or other  patient management decisions. Negative results must be combined with clinical observations, patient history, and epidemiological information. The expected result is Negative. Fact Sheet for Patients: SugarRoll.be Fact Sheet for Healthcare Providers: https://www.woods-mathews.com/ This test is not yet approved or cleared by the [place] FDA and  has been authorized for detection and/or diagnosis of SARS-CoV-2 by FDA under an Emergency Use Authorization (EUA). This EUA will remain  in effect (meaning this test can be used) for the duration of the COVID-19 declaration under Section 56 4(b)(1) of the Act, 21 U.S.C. section 360bbb-3(b)(1), unless the authorization is terminated or revoked sooner. Performed at [facility], [place] [address] [place], [place] [address]     Time coordinating discharge: Approximately 40 minutes  Patrecia Pour, MD  [facility] 09/04/2019, 2:37 PM

## 2019-09-04 NOTE — Discharge Instructions (Signed)
Bradycardia, Adult Bradycardia is a slower-than-normal heartbeat. A normal resting heart rate for an adult ranges from 60 to 100 beats per minute. With bradycardia, the resting heart rate is less than 60 beats per minute. Bradycardia can prevent enough oxygen from reaching certain areas of your body when you are active. It can be serious if it keeps enough oxygen from reaching your brain and other parts of your body. Bradycardia is not a problem for everyone. For some healthy adults, a slow resting heart rate is normal. What are the causes? This condition may be caused by:  A problem with the heart, including: ? A problem with the heart's electrical system, such as a heart block. With a heart block, electrical signals between the chambers of the heart are partially or completely blocked, so they are not able to work as they should. ? A problem with the heart's natural pacemaker (sinus node). ? Heart disease. ? A heart attack. ? Heart damage. ? Lyme disease. ? A heart infection. ? A heart condition that is present at birth (congenital heart defect).  Certain medicines that treat heart conditions.  Certain conditions, such as hypothyroidism and obstructive sleep apnea.  Problems with the balance of chemicals and other substances, like potassium, in the blood.  Trauma.  Radiation therapy. What increases the risk? You are more likely to develop this condition if you:  Are age 65 or older.  Have high blood pressure (hypertension), high cholesterol (hyperlipidemia), or diabetes.  Drink heavily, use tobacco or nicotine products, or use drugs. What are the signs or symptoms? Symptoms of this condition include:  Light-headedness.  Feeling faint or fainting.  Fatigue and weakness.  Trouble with activity or exercise.  Shortness of breath.  Chest pain (angina).  Drowsiness.  Confusion.  Dizziness. How is this diagnosed? This condition may be diagnosed based on:  Your  symptoms.  Your medical history.  A physical exam. During the exam, your health care provider will listen to your heartbeat and check your pulse. To confirm the diagnosis, your health care provider may order tests, such as:  Blood tests.  An electrocardiogram (ECG). This test records the heart's electrical activity. The test can show how fast your heart is beating and whether the heartbeat is steady.  A test in which you wear a portable device (event recorder or Holter monitor) to record your heart's electrical activity while you go about your day.  Anexercise test. How is this treated? Treatment for this condition depends on the cause of the condition and how severe your symptoms are. Treatment may involve:  Treatment of the underlying condition.  Changing your medicines or how much medicine you take.  Having a small, battery-operated device called a pacemaker implanted under the skin. When bradycardia occurs, this device can be used to increase your heart rate and help your heart beat in a regular rhythm. Follow these instructions at home: Lifestyle   Manage any health conditions that contribute to bradycardia as told by your health care provider.  Follow a heart-healthy diet. A nutrition specialist (dietitian) can help educate you about healthy food options and changes.  Follow an exercise program that is approved by your health care provider.  Maintain a healthy weight.  Try to reduce or manage your stress, such as with yoga or meditation. If you need help reducing stress, ask your health care provider.  Do not use any products that contain nicotine or tobacco, such as cigarettes, e-cigarettes, and chewing tobacco. If you need help   quitting, ask your health care provider.  Do not use illegal drugs.  Limit alcohol intake to no more than 1 drink a day for nonpregnant women and 2 drinks a day for men. Be aware of how much alcohol is in your drink. In the [place] one drink  equals one 12 oz bottle of beer (355 mL), one 5 oz glass of wine (148 mL), or one 1 oz glass of hard liquor (44 mL). General instructions  Take over-the-counter and prescription medicines only as told by your health care provider.  Keep all follow-up visits as told by your health care provider. This is important. How is this prevented? In some cases, bradycardia may be prevented by:  Treating underlying medical problems.  Stopping behaviors or medicines that can trigger the condition. Contact a health care provider if you:  Feel light-headed or dizzy.  Almost faint.  Feel weak or are easily fatigued during physical activity.  Experience confusion or have memory problems. Get help right away if:  You faint.  You have: ? An irregular heartbeat (palpitations). ? Chest pain. ? Trouble breathing. Summary  Bradycardia is a slower-than-normal heartbeat. With bradycardia, the resting heart rate is less than 60 beats per minute.  Treatment for this condition depends on the cause.  Manage any health conditions that contribute to bradycardia as told by your health care provider.  Do not use any products that contain nicotine or tobacco, such as cigarettes, e-cigarettes, and chewing tobacco, and limit alcohol intake.  Keep all follow-up visits as told by your health care provider. This is important. This information is not intended to replace advice given to you by your health care provider. Make sure you discuss any questions you have with your health care provider. Document Revised: 11/30/2017 Document Reviewed: 10/28/2017 Elsevier Patient Education  [place].   Syncope  Syncope refers to a condition in which a person temporarily loses consciousness. Syncope may also be called fainting or passing out. It is caused by a sudden decrease in blood flow to the brain. Even though most causes of syncope are not dangerous, syncope can be a sign of a serious medical problem.  Your health care provider may do tests to find the reason why you are having syncope. Signs that you may be about to faint include:  Feeling dizzy or light-headed.  Feeling nauseous.  Seeing all white or all black in your field of vision.  Having cold, clammy skin. If you faint, get medical help right away. Call your local emergency services (911 in the [place] Do not drive yourself to the hospital. Follow these instructions at home: Pay attention to any changes in your symptoms. Take these actions to stay safe and to help relieve your symptoms: Lifestyle  Do not drive, use machinery, or play sports until your health care provider says it is okay.  Do not drink alcohol.  Do not use any products that contain nicotine or tobacco, such as cigarettes and e-cigarettes. If you need help quitting, ask your health care provider.  Drink enough fluid to keep your urine pale yellow. General instructions  Take over-the-counter and prescription medicines only as told by your health care provider.  If you are taking blood pressure or heart medicine, get up slowly and take several minutes to sit and then stand. This can reduce dizziness or light-headedness.  Have someone stay with you until you feel stable.  If you start to feel like you might faint, lie down right away and raise (  elevate) your feet above the level of your heart. Breathe deeply and steadily. Wait until all the symptoms have passed.  Keep all follow-up visits as told by your health care provider. This is important. Get help right away if you:  Have a severe headache.  Faint once or repeatedly.  Have pain in your chest, abdomen, or back.  Have a very fast or irregular heartbeat (palpitations).  Have pain when you breathe.  Are bleeding from your mouth or rectum, or you have black or tarry stool.  Have a seizure.  Are confused.  Have trouble walking.  Have severe weakness.  Have vision problems. These symptoms  may represent a serious problem that is an emergency. Do not wait to see if your symptoms will go away. Get medical help right away. Call your local emergency services (911 in the [place] Do not drive yourself to the hospital. Summary  Syncope refers to a condition in which a person temporarily loses consciousness. It is caused by a sudden decrease in blood flow to the brain.  Signs that you may be about to faint include dizziness, feeling light-headed, feeling nauseous, sudden vision changes, or cold, clammy skin.  Although most causes of syncope are not dangerous, syncope can be a sign of a serious medical problem. If you faint, get medical help right away. This information is not intended to replace advice given to you by your health care provider. Make sure you discuss any questions you have with your health care provider. Document Revised: 05/01/2017 Document Reviewed: 04/27/2017 Elsevier Patient Education  2020 [employer].

## 2019-09-04 NOTE — Progress Notes (Signed)
*  PRELIMINARY RESULTS* Echocardiogram 2D Echocardiogram has been performed.  Dawn Alvarez Dawn Alvarez 09/04/2019, 10:01 AM

## 2019-09-08 ENCOUNTER — Telehealth: Payer: Self-pay

## 2019-09-08 NOTE — Telephone Encounter (Signed)
Patient contacted for follow up after 3rd chemotherapy cycle. Pt voiced no concerns.

## 2019-09-08 NOTE — Progress Notes (Signed)
Pharmacist Chemotherapy Monitoring - Follow Up Assessment    I verify that I have reviewed each item in the below checklist:  . Regimen for the patient is scheduled for the appropriate day and plan matches scheduled date. Marland Kitchen Appropriate non-routine labs are ordered dependent on drug ordered. . If applicable, additional medications reviewed and ordered per protocol based on lifetime cumulative doses and/or treatment regimen.   Plan for follow-up and/or issues identified: No . I-vent associated with next due treatment: No . MD and/or nursing notified: No  Jorgina Binning K 09/08/2019 9:56 AM

## 2019-09-08 NOTE — Progress Notes (Signed)

## 2019-09-09 DIAGNOSIS — Z794 Long term (current) use of insulin: Secondary | ICD-10-CM | POA: Diagnosis not present

## 2019-09-09 DIAGNOSIS — E1169 Type 2 diabetes mellitus with other specified complication: Secondary | ICD-10-CM | POA: Diagnosis not present

## 2019-09-09 DIAGNOSIS — E782 Mixed hyperlipidemia: Secondary | ICD-10-CM | POA: Diagnosis not present

## 2019-09-09 DIAGNOSIS — C50411 Malignant neoplasm of upper-outer quadrant of right female breast: Secondary | ICD-10-CM | POA: Diagnosis not present

## 2019-09-09 DIAGNOSIS — E876 Hypokalemia: Secondary | ICD-10-CM | POA: Diagnosis not present

## 2019-09-09 DIAGNOSIS — D125 Benign neoplasm of sigmoid colon: Secondary | ICD-10-CM | POA: Diagnosis not present

## 2019-09-09 DIAGNOSIS — I10 Essential (primary) hypertension: Secondary | ICD-10-CM | POA: Diagnosis not present

## 2019-09-09 DIAGNOSIS — J449 Chronic obstructive pulmonary disease, unspecified: Secondary | ICD-10-CM | POA: Diagnosis not present

## 2019-09-09 DIAGNOSIS — Z171 Estrogen receptor negative status [ER-]: Secondary | ICD-10-CM | POA: Diagnosis not present

## 2019-09-13 ENCOUNTER — Telehealth: Payer: Self-pay

## 2019-09-13 NOTE — Telephone Encounter (Signed)
Called [facility]mom informing patient need to scheduled a hospital follow up. klh

## 2019-09-14 ENCOUNTER — Other Ambulatory Visit: Payer: Self-pay

## 2019-09-14 DIAGNOSIS — H40003 Preglaucoma, unspecified, bilateral: Secondary | ICD-10-CM | POA: Diagnosis not present

## 2019-09-15 ENCOUNTER — Other Ambulatory Visit: Payer: Self-pay | Admitting: *Deleted

## 2019-09-15 ENCOUNTER — Encounter: Payer: Self-pay | Admitting: Oncology

## 2019-09-15 ENCOUNTER — Other Ambulatory Visit: Payer: Self-pay

## 2019-09-15 ENCOUNTER — Inpatient Hospital Stay (HOSPITAL_BASED_OUTPATIENT_CLINIC_OR_DEPARTMENT_OTHER): Payer: Medicare HMO | Admitting: Oncology

## 2019-09-15 ENCOUNTER — Inpatient Hospital Stay: Payer: Medicare HMO

## 2019-09-15 VITALS — BP 107/68 | HR 94 | Temp 98.1°F | Resp 18 | Wt 172.4 lb

## 2019-09-15 DIAGNOSIS — E11649 Type 2 diabetes mellitus with hypoglycemia without coma: Secondary | ICD-10-CM | POA: Diagnosis not present

## 2019-09-15 DIAGNOSIS — I10 Essential (primary) hypertension: Secondary | ICD-10-CM

## 2019-09-15 DIAGNOSIS — C50411 Malignant neoplasm of upper-outer quadrant of right female breast: Secondary | ICD-10-CM

## 2019-09-15 DIAGNOSIS — Z5189 Encounter for other specified aftercare: Secondary | ICD-10-CM | POA: Diagnosis not present

## 2019-09-15 DIAGNOSIS — E119 Type 2 diabetes mellitus without complications: Secondary | ICD-10-CM | POA: Diagnosis not present

## 2019-09-15 DIAGNOSIS — R55 Syncope and collapse: Secondary | ICD-10-CM

## 2019-09-15 DIAGNOSIS — Z79899 Other long term (current) drug therapy: Secondary | ICD-10-CM | POA: Diagnosis not present

## 2019-09-15 DIAGNOSIS — E1165 Type 2 diabetes mellitus with hyperglycemia: Secondary | ICD-10-CM

## 2019-09-15 DIAGNOSIS — Z5111 Encounter for antineoplastic chemotherapy: Secondary | ICD-10-CM | POA: Diagnosis not present

## 2019-09-15 DIAGNOSIS — Z95828 Presence of other vascular implants and grafts: Secondary | ICD-10-CM

## 2019-09-15 DIAGNOSIS — Z87891 Personal history of nicotine dependence: Secondary | ICD-10-CM | POA: Diagnosis not present

## 2019-09-15 DIAGNOSIS — Z794 Long term (current) use of insulin: Secondary | ICD-10-CM | POA: Diagnosis not present

## 2019-09-15 LAB — CBC WITH DIFFERENTIAL/PLATELET
Abs Immature Granulocytes: 0.2 10*3/uL — ABNORMAL HIGH (ref 0.00–0.07)
Basophils Absolute: 0.1 10*3/uL (ref 0.0–0.1)
Basophils Relative: 1 %
Eosinophils Absolute: 0.1 10*3/uL (ref 0.0–0.5)
Eosinophils Relative: 1 %
HCT: 28 % — ABNORMAL LOW (ref 36.0–46.0)
Hemoglobin: 9.5 g/dL — ABNORMAL LOW (ref 12.0–15.0)
Immature Granulocytes: 2 %
Lymphocytes Relative: 15 %
Lymphs Abs: 1.7 10*3/uL (ref 0.7–4.0)
MCH: 28.6 pg (ref 26.0–34.0)
MCHC: 33.9 g/dL (ref 30.0–36.0)
MCV: 84.3 fL (ref 80.0–100.0)
Monocytes Absolute: 1.1 10*3/uL — ABNORMAL HIGH (ref 0.1–1.0)
Monocytes Relative: 10 %
Neutro Abs: 8.1 10*3/uL — ABNORMAL HIGH (ref 1.7–7.7)
Neutrophils Relative %: 71 %
Platelets: 226 10*3/uL (ref 150–400)
RBC: 3.32 MIL/uL — ABNORMAL LOW (ref 3.87–5.11)
RDW: 15.8 % — ABNORMAL HIGH (ref 11.5–15.5)
WBC: 11.3 10*3/uL — ABNORMAL HIGH (ref 4.0–10.5)
nRBC: 0 % (ref 0.0–0.2)

## 2019-09-15 LAB — COMPREHENSIVE METABOLIC PANEL
ALT: 12 U/L (ref 0–44)
AST: 16 U/L (ref 15–41)
Albumin: 3.6 g/dL (ref 3.5–5.0)
Alkaline Phosphatase: 52 U/L (ref 38–126)
Anion gap: 11 (ref 5–15)
BUN: 11 mg/dL (ref 8–23)
CO2: 22 mmol/L (ref 22–32)
Calcium: 8.7 mg/dL — ABNORMAL LOW (ref 8.9–10.3)
Chloride: 103 mmol/L (ref 98–111)
Creatinine, Ser: 0.93 mg/dL (ref 0.44–1.00)
GFR calc Af Amer: >60 mL/min (ref >60)
GFR calc non Af Amer: >60 mL/min (ref >60)
Glucose, Bld: 211 mg/dL — ABNORMAL HIGH (ref 70–99)
Potassium: 3.5 mmol/L (ref 3.5–5.1)
Sodium: 136 mmol/L (ref 135–145)
Total Bilirubin: 0.5 mg/dL (ref 0.3–1.2)
Total Protein: 6.4 g/dL — ABNORMAL LOW (ref 6.5–8.1)

## 2019-09-15 MED ORDER — HEPARIN SOD (PORK) LOCK FLUSH 100 UNIT/ML IV SOLN
500.0000 [IU] | Freq: Once | INTRAVENOUS | Status: AC
  Administered 2019-09-15: 500 [IU] via INTRAVENOUS
  Filled 2019-09-15: qty 5

## 2019-09-15 MED ORDER — PROCHLORPERAZINE MALEATE 10 MG PO TABS: 10.0000 mg | ORAL_TABLET | Freq: Four times a day (QID) | ORAL | 1 refills | Status: DC | PRN

## 2019-09-15 MED ORDER — SODIUM CHLORIDE 0.9% FLUSH
10.0000 mL | Freq: Once | INTRAVENOUS | Status: AC
  Administered 2019-09-15: 10 mL via INTRAVENOUS
  Filled 2019-09-15: qty 10

## 2019-09-15 NOTE — Progress Notes (Signed)
Hematology/Oncology follow up note [facility] [phone] [phone]   Patient Care Team: Lavera Guise, MD as PCP - General (Internal Medicine) Rico Junker, RN as Registered Nurse Theodore Demark, RN as Registered Nurse  REFERRING PROVIDER: Lavera Guise, MD  CHIEF COMPLAINTS/REASON FOR VISIT:  Follow-up for breast cancer  HISTORY OF PRESENTING ILLNESS:   Dawn Alvarez is a  64 y.o.  female with PMH listed below was seen in consultation at the request of  Lavera Guise, MD  for evaluation of breast cancer Patient had screening mammogram done 08/12/2017 which showed right breast asymmetry.  A diagnostic mammogram was suggested and patient did not have it done. Patient felt her right breast mass for a few weeks.  She had bilateral diagnostic mammogram done on 06/30/2019. 2.8 x 2.4 x 2.6 cm right breast mass, 11:00, 10 cm from the nipple.  There is a single mildly abnormal node in the right axilla with a cortex measuring up to 4.4 mm.  No other suspicious findings. Patient underwent ultrasound-guided core biopsy of the right breast mass and right axilla lymph node Pathology showed invasive mammary carcinoma, no special type, grade 3, ER/PR HER-2 status are pending. Right axillary lymph node biopsy showed predominantly blood in the fibroadipose tissue, with scant lymphoid tissue present.  No definite malignancy was identified.  Patient was referred to cancer center to establish care and discuss treatment plan. Menarche 28 Postmenopausal.  LMP when she was 64 years old. She recalls use of birth control pills. Denies any hormone .  Replacement therapy. Denies any prior chest radiation. She reports family history of maternal grandmother and 2 maternal cousins were diagnosed with cancer.  She does not know about details.  INTERVAL HISTORY Dawn Alvarez is a 64 y.o. female who has above history reviewed by me today presents for follow up  visit for management of breast cancer, evaluation prior to chemotherapy. Problems and complaints are listed below: 09/03/2019, patient had an episode of syncope and EMS was called and patient sent to emergency room.  Patient was noted to have a heart rate 45-65 and blood glucose level of 286. Patient denies any history of nausea, vomiting, diarrhea.  Denies any dehydration.  She reported history of intermittent abdominal pain. CT head without contrast showed no acute intracranial abnormality.  CT abdomen pelvis with contrast is negative for evidence of acute abdominal abnormality.  Patient was given IV fluid. Patient was observed with holding her blood pressure medication including amlodipine, bisoprolol.  No arrhythmia was noted on telemetry.  Echocardiogram showed LVEF 60 to 65%.  No valvular abnormalities.  Beta-blocker and diuretics was discontinued.  Today patient was accompanied by her daughter for follow-up prior to chemotherapy. Patient has no new complaints. Blood pressure 107/68, pulse rate 94.  Patient is not taking hydralazine, and lisinopril.  She takes amlodipine.  Review of Systems  Constitutional: Negative for appetite change, chills, fatigue and fever.  HENT:   Negative for hearing loss and voice change.   Eyes: Negative for eye problems.  Respiratory: Negative for chest tightness and cough.   Cardiovascular: Negative for chest pain.  Gastrointestinal: Negative for abdominal distention, abdominal pain and blood in stool.  Endocrine: Negative for hot flashes.  Genitourinary: Negative for difficulty urinating and frequency.   Musculoskeletal: Negative for arthralgias.  Skin: Negative for itching and rash.  Neurological: Negative for extremity weakness.  Hematological: Negative for adenopathy.  Psychiatric/Behavioral: Negative for confusion.    MEDICAL  HISTORY:  Past Medical History:  Diagnosis Date  . Asthma   . Cancer ([place])    breast  . COPD exacerbation ([place])  04/12/2016  . Diabetes mellitus without complication ([place])   . Hyperlipemia   . Hypertension     SURGICAL HISTORY: Past Surgical History:  Procedure Laterality Date  . BREAST BIOPSY Right 07/05/2019   [place] bx venus marker, path pending  . BREAST BIOPSY Right 07/05/2019   LN bx, hydromarker, path pending  . CESAREAN SECTION    . COLONOSCOPY WITH PROPOFOL N/A 09/11/2015   Procedure: COLONOSCOPY WITH PROPOFOL;  Surgeon: Lucilla Lame, MD;  Location: [facility] ENDOSCOPY;  Service: Endoscopy;  Laterality: N/A;  . IR IMAGING GUIDED PORT INSERTION  07/20/2019    SOCIAL HISTORY: Social History   Socioeconomic History  . Marital status: Married    Spouse name: Not on file  . Number of children: Not on file  . Years of education: Not on file  . Highest education level: Not on file  Occupational History  . Not on file  Tobacco Use  . Smoking status: Former Smoker    Packs/day: 1.00    Years: 2.00    Pack years: 2.00    Types: Cigarettes    Quit date: 05/19/2019    Years since quitting: 0.3  . Smokeless tobacco: Never Used  Substance and Sexual Activity  . Alcohol use: Yes    Alcohol/week: 2.0 standard drinks    Types: 2 Cans of beer per week    Comment: ocassionally   . Drug use: No  . Sexual activity: Never    Birth control/protection: Abstinence  Other Topics Concern  . Not on file  Social History Narrative  . Not on file   Social Determinants of Health   Financial Resource Strain:   . Difficulty of Paying Living Expenses:   Food Insecurity:   . Worried About Charity fundraiser in the Last Year:   . Arboriculturist in the Last Year:   Transportation Needs:   . Film/video editor (Medical):   Marland Kitchen Lack of Transportation (Non-Medical):   Physical Activity:   . Days of Exercise per Week:   . Minutes of Exercise per Session:   Stress:   . Feeling of Stress :   Social Connections:   . Frequency of Communication with Friends and Family:   . Frequency of Social Gatherings  with Friends and Family:   . Attends Religious Services:   . Active Member of Clubs or Organizations:   . Attends Archivist Meetings:   Marland Kitchen Marital Status:   Intimate Partner Violence:   . Fear of Current or Ex-Partner:   . Emotionally Abused:   Marland Kitchen Physically Abused:   . Sexually Abused:     FAMILY HISTORY: Family History  Problem Relation Age of Onset  . Diabetes Mother   . Hypertension Mother   . Hypertension Father   . Diabetes Father     ALLERGIES:  has No Known Allergies.  MEDICATIONS:  Current Outpatient Medications  Medication Sig Dispense Refill  . amLODipine (NORVASC) 10 MG tablet Take 1 tablet (10 mg total) by mouth daily. 30 tablet 5  . brimonidine (ALPHAGAN) 0.2 % ophthalmic solution Place 1 drop into both eyes 2 (two) times daily.    Marland Kitchen dexamethasone (DECADRON) 4 MG tablet     . glucose blood (ACCU-CHEK SMARTVIEW) test strip 1 each by Other route as needed for other. Use as directed twice a day  diag E11.65    . hydrALAZINE (APRESOLINE) 50 MG tablet Take 1 tablet (50 mg total) by mouth 3 (three) times daily. 90 tablet 3  . insulin detemir (LEVEMIR) 100 UNIT/ML injection Inject 0.4 mLs (40 Units total) into the skin daily. 10 mL 5  . Insulin Pen Needle (PEN NEEDLES) 31G X 8 MM MISC Use as directed with insulin E11.65 100 each 1  . Lancets Misc. (ACCU-CHEK MULTICLIX LANCET DEV) KIT by Does not apply route. Use as directed twice a day diag E11.65    . lidocaine-prilocaine (EMLA) cream     . lisinopril (ZESTRIL) 20 MG tablet Take 1 tablet (20 mg total) by mouth daily. 30 tablet 3  . Omega-3 Fatty Acids (FISH OIL) 1000 MG CAPS Take 1,000 mg by mouth daily.     . Semaglutide, 1 MG/DOSE, (OZEMPIC, 1 MG/DOSE,) 2 MG/1.5ML SOPN Inject 1 mg into the skin once a week. 5 pen 3  . SIMBRINZA 1-0.2 % SUSP     . prochlorperazine (COMPAZINE) 10 MG tablet Take 10 mg by mouth every 6 (six) hours as needed for nausea or vomiting.     No current facility-administered medications  for this visit.     PHYSICAL EXAMINATION: ECOG PERFORMANCE STATUS: 0 - Asymptomatic Vitals:   09/15/19 0902  BP: 107/68  Pulse: 94  Resp: 18  Temp: 98.1 F (36.7 C)   Filed Weights   09/15/19 0902  Weight: 172 lb 6.4 oz (78.2 kg)    Physical Exam Constitutional:      General: She is not in acute distress. HENT:     Head: Normocephalic and atraumatic.  Eyes:     General: No scleral icterus. Cardiovascular:     Rate and Rhythm: Normal rate and regular rhythm.     Heart sounds: Normal heart sounds.  Pulmonary:     Effort: Pulmonary effort is normal. No respiratory distress.     Breath sounds: No wheezing.  Abdominal:     General: Bowel sounds are normal. There is no distension.     Palpations: Abdomen is soft.  Musculoskeletal:        General: No deformity. Normal range of motion.     Cervical back: Normal range of motion and neck supple.  Skin:    General: Skin is warm and dry.     Findings: No erythema or rash.  Neurological:     Mental Status: She is alert and oriented to person, place, and time. Mental status is at baseline.     Cranial Nerves: No cranial nerve deficit.     Coordination: Coordination normal.  Psychiatric:        Mood and Affect: Mood normal.    Right breast examination showed decreased size of 11:00 mass.  No palpable axillary lymphadenopathy.   LABORATORY DATA:  I have reviewed the data as listed Lab Results  Component Value Date   WBC 11.3 (H) 09/15/2019   HGB 9.5 (L) 09/15/2019   HCT 28.0 (L) 09/15/2019   MCV 84.3 09/15/2019   PLT 226 09/15/2019   Recent Labs    09/01/19 0818 09/01/19 0818 09/03/19 1825 09/04/19 0505 09/15/19 0851  NA 135   < > 140 142 136  K 3.8   < > 3.4* 4.6 3.5  CL 101   < > 107 110 103  CO2 25   < > '22 27 22  ' GLUCOSE 272*   < > 253* 202* 211*  BUN 22   < > 27* 19 11  CREATININE 1.06*   < > 1.01* 0.80 0.93  CALCIUM 9.1   < > 9.2 9.3 8.7*  GFRNONAA 56*   < > 59* >60 >60  GFRAA >60   < > >60 >60 >60    PROT 6.7  --  6.4*  --  6.4*  ALBUMIN 3.6  --  3.5  --  3.6  AST 15  --  14*  --  16  ALT 14  --  12  --  12  ALKPHOS 64  --  62  --  52  BILITOT 0.3  --  0.7  --  0.5   < > = values in this interval not displayed.   Iron/TIBC/Ferritin/ %Sat No results found for: IRON, TIBC, FERRITIN, IRONPCTSAT    RADIOGRAPHIC STUDIES: I have personally reviewed the radiological images as listed and agreed with the findings in the report. CT Head Wo Contrast  Result Date: 09/03/2019 CLINICAL DATA:  Encephalopathy. EXAM: CT HEAD WITHOUT CONTRAST TECHNIQUE: Contiguous axial images were obtained from the base of the skull through the vertex without intravenous contrast. COMPARISON:  Head CT 07/30/2019 FINDINGS: Brain: Brain volume is normal for age. No intracranial hemorrhage, mass effect, or midline shift. No hydrocephalus. The basilar cisterns are patent. Unchanged periventricular and deep chronic small vessel ischemia. Punctate lacunar infarct in the right thalamus. No evidence of territorial infarct or acute ischemia. No extra-axial or intracranial fluid collection. Vascular: No hyperdense vessel. Skull: Normal. Negative for fracture or focal lesion. Sinuses/Orbits: Paranasal sinuses and mastoid air cells are clear. The visualized orbits are unremarkable. Other: None. IMPRESSION: 1. No acute intracranial abnormality. 2. Stable chronic small vessel ischemia from prior. Electronically Signed   By: Keith Rake M.D.   On: 09/03/2019 19:53   CT Head Wo Contrast  Result Date: 07/30/2019 CLINICAL DATA:  64 year old female with altered mental status EXAM: CT HEAD WITHOUT CONTRAST TECHNIQUE: Contiguous axial images were obtained from the base of the skull through the vertex without intravenous contrast. COMPARISON:  MR 05/04/2010, CT 12/28/2018 FINDINGS: Brain: No acute intracranial hemorrhage. No midline shift or mass effect. Gray-white differentiation maintained. Confluent hypodensity in the periventricular  white matter, similar to the prior MR and CT. Unremarkable appearance of the ventricular system. Vascular: Mild atherosclerotic calcifications. Skull: No acute fracture.  No aggressive bone lesion identified. Sinuses/Orbits: Unremarkable appearance of the orbits. Mastoid air cells clear. No middle ear effusion. No significant sinus disease. Other: None IMPRESSION: Negative for acute intracranial abnormality. Chronic microvascular ischemic disease. Electronically Signed   By: Corrie Mckusick D.O.   On: 07/30/2019 14:46   NM Cardiac Muga Rest  Result Date: 07/25/2019 CLINICAL DATA:  Breast cancer. Evaluate cardiac function in relation to chemotherapy. EXAM: NUCLEAR MEDICINE CARDIAC BLOOD POOL IMAGING (MUGA) TECHNIQUE: Cardiac multi-gated acquisition was performed at rest following intravenous injection of Tc-50mlabeled red blood cells. RADIOPHARMACEUTICALS:  22.8 mCi Tc-942mertechnetate in-vitro labeled red blood cells IV COMPARISON:  None FINDINGS: No  focal wall motion abnormality of the left ventricle. Calculated left ventricular ejection fraction equals 74 % IMPRESSION: Left ventricular ejection fraction equals74 %. Electronically Signed   By: StSuzy Bouchard.D.   On: 07/25/2019 15:16   CT ABDOMEN PELVIS W CONTRAST  Result Date: 09/03/2019 CLINICAL DATA:  Nausea vomiting. EXAM: CT ABDOMEN AND PELVIS WITH CONTRAST TECHNIQUE: Multidetector CT imaging of the abdomen and pelvis was performed using the standard protocol following bolus administration of intravenous contrast. CONTRAST:  10093mMNIPAQUE IOHEXOL 300 MG/ML  SOLN COMPARISON:  April 20, 2016 FINDINGS: Lower chest: No acute abnormality. Hepatobiliary: No focal liver abnormality is seen. No gallstones, gallbladder wall thickening, or biliary dilatation. Pancreas: Unremarkable. No pancreatic ductal dilatation or surrounding inflammatory changes. Spleen: Normal in size without focal abnormality. Adrenals/Urinary Tract: Adrenal glands are  unremarkable. Kidneys are normal, without renal calculi, solid lesion, or hydronephrosis. 2 cm right renal cyst. Bladder is unremarkable. Stomach/Bowel: Stomach is within normal limits. Appendix appears normal. No evidence of bowel wall thickening, distention, or inflammatory changes. Vascular/Lymphatic: Aortic atherosclerosis. No enlarged abdominal or pelvic lymph nodes. Reproductive: Numerous some calcified myometrial masses. Other: No abdominal wall hernia or abnormality. No abdominopelvic ascites. Musculoskeletal: L4-L5 spondylosis. IMPRESSION: 1. No evidence of acute abnormalities within the solid abdominal organs. 2. Myometrial fibroids. Aortic Atherosclerosis (ICD10-I70.0). Electronically Signed   By: Fidela Salisbury M.D.   On: 09/03/2019 19:54   US BREAST LTD UNI RIGHT INC AXILLA  Result Date: 06/30/2019 CLINICAL DATA:  Palpable lump in the right breast EXAM: DIGITAL DIAGNOSTIC BILATERAL MAMMOGRAM WITH CAD AND TOMO ULTRASOUND RIGHT BREAST COMPARISON:  Previous exam(s). ACR Breast Density Category b: There are scattered areas of fibroglandular density. FINDINGS: There is a spiculated mass in the region of the patient's palpable lump measuring 3.8 cm mammographically. No other suspicious findings are identified in either breast. Mammographic images were processed with CAD. On physical exam, there is a palpable lump in the right breast at 11 o'clock, 10 cm from the nipple. Targeted ultrasound is performed, showing an irregular hypoechoic mass at 11 o'clock, 10 cm from the nipple measuring 2.8 x 2.4 x 2.6 cm. There is a single mildly abnormal node in the low right axilla with a cortex measuring up to 4.4 mm. No other suspicious findings. IMPRESSION: Highly suspicious right breast mass. Mildly abnormal right axillary node. No abnormalities on the left. RECOMMENDATION: Recommend ultrasound-guided biopsy of the right breast mass and a mildly abnormal right axillary node. I have discussed the findings and  recommendations with the patient. If applicable, a reminder letter will be sent to the patient regarding the next appointment. BI-RADS CATEGORY  5: Highly suggestive of malignancy. Electronically Signed   By: Dorise Bullion III M.D   On: 06/30/2019 12:19   MM DIAG BREAST TOMO BILATERAL  Result Date: 06/30/2019 CLINICAL DATA:  Palpable lump in the right breast EXAM: DIGITAL DIAGNOSTIC BILATERAL MAMMOGRAM WITH CAD AND TOMO ULTRASOUND RIGHT BREAST COMPARISON:  Previous exam(s). ACR Breast Density Category b: There are scattered areas of fibroglandular density. FINDINGS: There is a spiculated mass in the region of the patient's palpable lump measuring 3.8 cm mammographically. No other suspicious findings are identified in either breast. Mammographic images were processed with CAD. On physical exam, there is a palpable lump in the right breast at 11 o'clock, 10 cm from the nipple. Targeted ultrasound is performed, showing an irregular hypoechoic mass at 11 o'clock, 10 cm from the nipple measuring 2.8 x 2.4 x 2.6 cm. There is a single mildly abnormal node in the low right axilla with a cortex measuring up to 4.4 mm. No other suspicious findings. IMPRESSION: Highly suspicious right breast mass. Mildly abnormal right axillary node. No abnormalities on the left. RECOMMENDATION: Recommend ultrasound-guided biopsy of the right breast mass and a mildly abnormal right axillary node. I have discussed the findings and recommendations with the patient. If applicable, a reminder letter will be sent to the patient regarding the next appointment. BI-RADS CATEGORY  5: Highly suggestive of malignancy. Electronically Signed   By: Dorise Bullion III  M.D   On: 06/30/2019 12:19   ECHOCARDIOGRAM COMPLETE  Result Date: 09/04/2019    ECHOCARDIOGRAM REPORT   Patient Name:   Dawn Alvarez Date of Exam: 09/04/2019 Medical Rec #:  [id number]        Height:       61.0 in Accession #:    [id number]       Weight:       169.2 lb Date of  Birth:  [birth year]        BSA:          1.759 m Patient Age:    19 years         BP:           175/83 mmHg Patient Gender: F                HR:           67 bpm. Exam Location:  [facility] Procedure: 2D Echo Indications:     Syncope 780.2/ R55  History:         Patient has no prior history of Echocardiogram examinations.  Sonographer:     Arville Go RDCS Referring Phys:  [place] Diagnosing Phys: Ida Rogue MD IMPRESSIONS  1. Left ventricular ejection fraction, by estimation, is 60 to 65%. The left ventricle has normal function. The left ventricle has no regional wall motion abnormalities. There is mild left ventricular hypertrophy. Left ventricular diastolic parameters were normal.  2. Right ventricular systolic function is normal. The right ventricular size is normal. Tricuspid regurgitation signal is inadequate for assessing PA pressure. FINDINGS  Left Ventricle: Left ventricular ejection fraction, by estimation, is 60 to 65%. The left ventricle has normal function. The left ventricle has no regional wall motion abnormalities. The left ventricular internal cavity size was normal in size. There is  mild left ventricular hypertrophy. Left ventricular diastolic parameters were normal. Right Ventricle: The right ventricular size is normal. No increase in right ventricular wall thickness. Right ventricular systolic function is normal. Tricuspid regurgitation signal is inadequate for assessing PA pressure. Left Atrium: Left atrial size was normal in size. Right Atrium: Right atrial size was normal in size. Pericardium: There is no evidence of pericardial effusion. Mitral Valve: The mitral valve is normal in structure. Normal mobility of the mitral valve leaflets. Trivial mitral valve regurgitation. No evidence of mitral valve stenosis. Tricuspid Valve: The tricuspid valve is normal in structure. Tricuspid valve regurgitation is not demonstrated. No evidence of tricuspid stenosis. Aortic Valve: The aortic  valve is normal in structure. Aortic valve regurgitation is not visualized. No aortic stenosis is present. Aortic valve mean gradient measures 7.0 mmHg. Aortic valve peak gradient measures 13.5 mmHg. Aortic valve area, by VTI measures 1.48 cm. Pulmonic Valve: The pulmonic valve was normal in structure. Pulmonic valve regurgitation is not visualized. No evidence of pulmonic stenosis. Aorta: The aortic root is normal in size and structure. Venous: The inferior vena cava is normal in size with greater than 50% respiratory variability, suggesting right atrial pressure of 3 mmHg. IAS/Shunts: No atrial level shunt detected by color flow Doppler.  LEFT VENTRICLE PLAX 2D LVIDd:         4.11 cm  Diastology LVIDs:         2.51 cm  LV e' lateral:   9.25 cm/s LV PW:         1.35 cm  LV E/e' lateral: 11.8 LV IVS:        1.17 cm  LV e' medial:    7.51 cm/s LVOT diam:     1.90 cm  LV E/e' medial:  14.5 LV SV:         65 LV SV Index:   37 LVOT Area:     2.84 cm  RIGHT VENTRICLE RV Basal diam:  2.39 cm RV S prime:     19.00 cm/s TAPSE (M-mode): 2.1 cm LEFT ATRIUM             Index       RIGHT ATRIUM           Index LA diam:        3.30 cm 1.88 cm/m  RA Area:     10.60 cm LA Vol (A2C):   41.0 ml 23.31 ml/m RA Volume:   22.90 ml  13.02 ml/m LA Vol (A4C):   40.2 ml 22.85 ml/m LA Biplane Vol: 42.6 ml 24.22 ml/m  AORTIC VALVE                    PULMONIC VALVE AV Area (Vmax):    1.59 cm     PV Vmax:       0.84 m/s AV Area (Vmean):   1.59 cm     PV Peak grad:  2.8 mmHg AV Area (VTI):     1.48 cm AV Vmax:           184.00 cm/s AV Vmean:          117.000 cm/s AV VTI:            0.441 m AV Peak Grad:      13.5 mmHg AV Mean Grad:      7.0 mmHg LVOT Vmax:         103.00 cm/s LVOT Vmean:        65.500 cm/s LVOT VTI:          0.230 m LVOT/AV VTI ratio: 0.52  AORTA Ao Root diam: 2.60 cm MITRAL VALVE MV Area (PHT): 3.12 cm     SHUNTS MV Decel Time: 243 msec     Systemic VTI:  0.23 m MV E velocity: 109.00 cm/s  Systemic Diam: 1.90 cm MV  A velocity: 85.90 cm/s MV E/A ratio:  1.27 Ida Rogue MD Electronically signed by Ida Rogue MD Signature Date/Time: 09/04/2019/1:50:56 PM    Final    MM CLIP PLACEMENT RIGHT  Result Date: 07/05/2019 CLINICAL DATA:  Ultrasound-guided core needle biopsies were performed of a suspicious palpable mass upper-outer quadrant right breast and an axillary lymph node with focal cortical thickening. EXAM: DIAGNOSTIC RIGHT MAMMOGRAM POST ULTRASOUND BIOPSIES COMPARISON:  Previous exam(s). FINDINGS: Mammographic images were obtained following ultrasound guided biopsy of a right breast mass in the upper-outer quadrant and ultrasound-guided biopsy of a right axillary lymph node. The Venus biopsy marking clip is in expected position at the site of the biopsied breast mass. HydroMARK biopsy clip is in the expected location of the biopsied lymph node. There are post biopsy changes surrounding the axillary biopsy clip. IMPRESSION: Appropriate positioning of the Venus and HydroMARK biopsy marking clips in the right breast and right axilla. Final Assessment: Post Procedure Mammograms for Marker Placement Electronically Signed   By: Curlene Dolphin M.D.   On: 07/05/2019 09:19   [place] RT BREAST BX W LOC DEV 1ST LESION IMG BX SPEC US GUIDE  Addendum Date: 07/06/2019   ADDENDUM REPORT: 07/06/2019 11:53 ADDENDUM: Pathology revealed GRADE III INVASIVE MAMMARY CARCINOMA, NO SPECIAL TYPE of the RIGHT  breast at 10-11 o'clock, 10 cm from nipple. This was found to be concordant by Dr. Curlene Dolphin. Pathology revealed PREDOMINANTLY BLOOD AND FIBROADIPOSE TISSUE, WITH SCANT LYMPHOID TISSUE PRESENT of the RIGHT axillary lymph node. This was found to be concordant by Dr. Curlene Dolphin. Correlate with sentinel lymph node biopsy. Pathology results were discussed with the patient by telephone. The patient reported doing well after the biopsy with tenderness at the site. Post biopsy instructions and care were reviewed and questions were answered.  The patient was encouraged to call [facility] for any additional concerns. Surgical and Oncologist referral will be arranged by Al Pimple RN and Tanya Nones RN of [place]. Pathology results reported by Stacie Acres RN on 07/06/2019. Electronically Signed   By: Curlene Dolphin M.D.   On: 07/06/2019 11:53   Result Date: 07/06/2019 CLINICAL DATA:  Ultrasound-guided core needle biopsy was recommended of a suspicious palpable mass in the upper-outer quadrant of the right breast. EXAM: ULTRASOUND GUIDED RIGHT BREAST CORE NEEDLE BIOPSY COMPARISON:  Previous exam(s). FINDINGS: I met with the patient and we discussed the procedure of ultrasound-guided biopsy, including benefits and alternatives. We discussed the high likelihood of a successful procedure. We discussed the risks of the procedure, including infection, bleeding, tissue injury, clip migration, and inadequate sampling. Informed written consent was given. The usual time-out protocol was performed immediately prior to the procedure. Lesion quadrant: Upper outer quadrant Using sterile technique and 1% Lidocaine as local anesthetic, under direct ultrasound visualization, a 12 gauge spring-loaded device was used to perform biopsy of a 2.8 cm palpable mass in the 10-11 o'clock axis of the right breast approximately 10 cm from the nipple using a lateral approach. At the conclusion of the procedure Venus tissue marker clip was deployed into the biopsy cavity. Follow up 2 view mammogram was performed and dictated separately. IMPRESSION: Ultrasound guided biopsy of the right breast. No apparent complications. Electronically Signed: By: Curlene Dolphin M.D. On: 07/05/2019 09:15   [place] RT BREAST BX W LOC DEV EA ADD LESION IMG BX SPEC US GUIDE  Addendum Date: 07/06/2019   ADDENDUM REPORT: 07/06/2019 11:56 ADDENDUM: Pathology revealed GRADE III INVASIVE MAMMARY CARCINOMA, NO SPECIAL TYPE of the  RIGHT breast at 10-11 o'clock, 10 cm from nipple. This was found to be concordant by Dr. Curlene Dolphin. Pathology revealed PREDOMINANTLY BLOOD AND FIBROADIPOSE TISSUE, WITH SCANT LYMPHOID TISSUE PRESENT of the RIGHT axillary lymph node. This was found to be concordant by Dr. Curlene Dolphin. Correlate with sentinel lymph node biopsy. Pathology results were discussed with the patient by telephone. The patient reported doing well after the biopsy with tenderness at the site. Post biopsy instructions and care were reviewed and questions were answered. The patient was encouraged to call [facility] [place] [facility] for any additional concerns. Surgical and Oncologist referral will be arranged by Al Pimple RN and Tanya Nones RN of [place]. Pathology results reported by Stacie Acres RN on 07/06/2019. Electronically Signed   By: Curlene Dolphin M.D.   On: 07/06/2019 11:56   Result Date: 07/06/2019 CLINICAL DATA:  Ultrasound-guided core needle biopsy was recommended of a right axillary lymph node with cortical thickening. EXAM: ULTRASOUND GUIDED RIGHT AXILLA CORE NEEDLE BIOPSY COMPARISON:  Previous exam(s). FINDINGS: I met with the patient and we discussed the procedure of ultrasound-guided biopsy, including benefits and alternatives. We discussed the high likelihood of a successful procedure. We  discussed the risks of the procedure, including infection, bleeding, tissue injury, clip migration, and inadequate sampling. Informed written consent was given. The usual time-out protocol was performed immediately prior to the procedure. Using sterile technique and 1% Lidocaine as local anesthetic, under direct ultrasound visualization, a 14 gauge spring-loaded device was used to perform biopsy of an axillary lymph node with focal cortical thickening using a lateral approach. At the conclusion of the procedure [facility] tissue marker clip was deployed into the biopsy  cavity. Follow up 2 view mammogram was performed and dictated separately. IMPRESSION: Ultrasound guided biopsy of a right axillary lymph node. No apparent complications. Electronically Signed: By: Curlene Dolphin M.D. On: 07/05/2019 09:16   IR IMAGING GUIDED PORT INSERTION  Result Date: 07/20/2019 CLINICAL DATA:  Right breast carcinoma and need for porta cath for chemotherapy. EXAM: IMPLANTED PORT A CATH PLACEMENT WITH ULTRASOUND AND FLUOROSCOPIC GUIDANCE ANESTHESIA/SEDATION: 1.0 mg IV Versed; 50 mcg IV Fentanyl Total Moderate Sedation Time:  28 minutes The patient's level of consciousness and physiologic status were continuously monitored during the procedure by Radiology nursing. Additional Medications: 2 g IV Ancef. FLUOROSCOPY TIME:  30 seconds. PROCEDURE: The procedure, risks, benefits, and alternatives were explained to the patient. Questions regarding the procedure were encouraged and answered. The patient understands and consents to the procedure. A time-out was performed prior to initiating the procedure. Ultrasound was utilized to confirm patency of the left internal jugular vein. The left neck and chest were prepped with chlorhexidine in a sterile fashion, and a sterile drape was applied covering the operative field. Maximum barrier sterile technique with sterile gowns and gloves were used for the procedure. Local anesthesia was provided with 1% lidocaine. After creating a small venotomy incision, a 21 gauge needle was advanced into the left internal jugular vein under direct, real-time ultrasound guidance. Ultrasound image documentation was performed. After securing guidewire access, an 8 Fr dilator was placed. A J-wire was kinked to measure appropriate catheter length. A subcutaneous port pocket was then created along the upper chest wall utilizing sharp and blunt dissection. Portable cautery was utilized. The pocket was irrigated with sterile saline. A single lumen power injectable port was chosen  for placement. The 8 Fr catheter was tunneled from the port pocket site to the venotomy incision. The port was placed in the pocket. External catheter was trimmed to appropriate length based on guidewire measurement. At the venotomy, an 8 Fr peel-away sheath was placed over a guidewire. The catheter was then placed through the sheath and the sheath removed. Final catheter positioning was confirmed and documented with a fluoroscopic spot image. The port was accessed with a needle and aspirated and flushed with heparinized saline. The access needle was removed. The venotomy and port pocket incisions were closed with subcutaneous 3-0 Monocryl and subcuticular 4-0 Vicryl. Dermabond was applied to both incisions. COMPLICATIONS: COMPLICATIONS None FINDINGS: After catheter placement, the tip lies at the cavo-atrial junction. The catheter aspirates normally and is ready for immediate use. IMPRESSION: Placement of single lumen port a cath via left internal jugular vein. The catheter tip lies at the cavo-atrial junction. A power injectable port a cath was placed and is ready for immediate use. Electronically Signed   By: Aletta Edouard M.D.   On: 07/20/2019 16:28      ASSESSMENT & PLAN:  1. Malignant neoplasm of upper-outer quadrant of right female breast, unspecified estrogen receptor status ([place])   2. Syncope, unspecified syncope type    cT2N0 grade 3 invasive mammary carcinoma.  ER negative, PR weakly positive (<=10%), HER-2 negative Status post 3 cycles of ddAC with growth factor support. Hold chemotherapy today due to recent history of syncope.  #Syncope, Her telemetry did not show arrhythmia.  Patient denies any history of dehydration.  No nausea vomiting or diarrhea. Blood pressure today on the low side, 107/68.  Pulse rate 94. Patient only takes amlodipine.  Beta-blocker and diuretics were on hold due to recent syncope. Patient had prechemotherapy MUGA testing which showed left ejection fraction  equals 75%. Recent 2D echocardiogram 09/04/2019 during her admission showed LVEF 60 to 65%. It is difficult to compare LVEF from MUGA study to 2D echocardiogram. Recommend patient to be seen by cardiology for further evaluation for patient to continue to proceed with Adriamycin chemotherapy. Patient and daughter agree with the plan.  #Uncontrolled diabetes, recent A1c on 06/20/2019 was 8.2.  Patient is on Levemir, semaglutide.  Continue follow-up with primary care provider for further optimization of diabetes All questions were answered. The patient knows to call the clinic with any problems questions or concerns.   Return of visit: To be determined Earlie Server, MD, PhD Hematology Oncology [facility] Pager- [phone] 09/15/2019

## 2019-09-15 NOTE — Progress Notes (Signed)
Patient reports a syncope episode on 4/3 and went to ER.

## 2019-09-15 NOTE — Progress Notes (Signed)
Per Elizabeth RN per Dr. Yu no treatment at this time. Pt stable at discharge.  

## 2019-09-16 ENCOUNTER — Ambulatory Visit (INDEPENDENT_AMBULATORY_CARE_PROVIDER_SITE_OTHER): Payer: Medicare HMO | Admitting: Internal Medicine

## 2019-09-16 ENCOUNTER — Other Ambulatory Visit: Payer: Self-pay

## 2019-09-16 ENCOUNTER — Ambulatory Visit (INDEPENDENT_AMBULATORY_CARE_PROVIDER_SITE_OTHER): Payer: Medicare HMO | Admitting: Cardiology

## 2019-09-16 ENCOUNTER — Other Ambulatory Visit: Payer: Self-pay | Admitting: Nurse Practitioner

## 2019-09-16 ENCOUNTER — Telehealth: Payer: Self-pay

## 2019-09-16 ENCOUNTER — Ambulatory Visit (INDEPENDENT_AMBULATORY_CARE_PROVIDER_SITE_OTHER): Payer: Medicare HMO

## 2019-09-16 ENCOUNTER — Encounter: Payer: Self-pay | Admitting: Internal Medicine

## 2019-09-16 ENCOUNTER — Encounter: Payer: Self-pay | Admitting: Cardiology

## 2019-09-16 ENCOUNTER — Inpatient Hospital Stay: Payer: Medicare HMO

## 2019-09-16 VITALS — BP 122/75 | HR 102 | Ht 61.0 in | Wt 175.1 lb

## 2019-09-16 VITALS — BP 116/68 | HR 87 | Temp 97.2°F | Resp 16 | Ht 61.0 in | Wt 175.0 lb

## 2019-09-16 DIAGNOSIS — R55 Syncope and collapse: Secondary | ICD-10-CM

## 2019-09-16 DIAGNOSIS — I10 Essential (primary) hypertension: Secondary | ICD-10-CM | POA: Diagnosis not present

## 2019-09-16 DIAGNOSIS — E1165 Type 2 diabetes mellitus with hyperglycemia: Secondary | ICD-10-CM

## 2019-09-16 DIAGNOSIS — Z171 Estrogen receptor negative status [ER-]: Secondary | ICD-10-CM

## 2019-09-16 DIAGNOSIS — C50411 Malignant neoplasm of upper-outer quadrant of right female breast: Secondary | ICD-10-CM | POA: Diagnosis not present

## 2019-09-16 MED ORDER — TRUEPLUS LANCETS 30G MISC: 3 refills | Status: DC

## 2019-09-16 MED ORDER — INSULIN DETEMIR 100 UNIT/ML FLEXPEN: SUBCUTANEOUS | 3 refills | Status: DC

## 2019-09-16 MED ORDER — BD SWAB SINGLE USE REGULAR PADS: MEDICATED_PAD | 3 refills | Status: DC

## 2019-09-16 MED ORDER — LEVEMIR FLEXTOUCH 100 UNIT/ML ~~LOC~~ SOPN: 44.0000 [IU] | PEN_INJECTOR | Freq: Every day | SUBCUTANEOUS | 3 refills | Status: DC

## 2019-09-16 MED ORDER — AMLODIPINE BESYLATE 5 MG PO TABS: ORAL_TABLET | ORAL | 3 refills | Status: DC

## 2019-09-16 MED ORDER — LISINOPRIL 20 MG PO TABS: ORAL_TABLET | ORAL | 3 refills | Status: DC

## 2019-09-16 MED ORDER — OZEMPIC (1 MG/DOSE) 2 MG/1.5ML ~~LOC~~ SOPN: 1.0000 mg | PEN_INJECTOR | SUBCUTANEOUS | 3 refills | Status: DC

## 2019-09-16 MED ORDER — TRUE METRIX AIR GLUCOSE METER W/DEVICE KIT: 1.0000 | PACK | Freq: Two times a day (BID) | 0 refills | Status: DC

## 2019-09-16 MED ORDER — TRUE METRIX BLOOD GLUCOSE TEST VI STRP: ORAL_STRIP | 3 refills | Status: DC

## 2019-09-16 NOTE — Progress Notes (Signed)
Changed levemir solution to flexpen 44units daily. Sent to [place]

## 2019-09-16 NOTE — Progress Notes (Signed)
[facility] [place], [place] [address]  Internal MEDICINE  Office Visit Note  Patient Name: Dawn Alvarez  [id number]  [id number]  Date of Service: 09/19/2019     Chief Complaint  Patient presents with  . Hospitalization Follow-up  . Hyperlipidemia  . Hypertension  . Diabetes    HPI Pt is here for recent hospital follow up. She has few hospital admissions one for bradycardia and hypotension. She had cardiology follow up which was negative. Pt is undergoing current chemotherapy for right breast ER negative PR positive and HER-2 negative. Her staging is cT2N0. She is getting chemotherapy. Her blood sugar has been elevated due to steroid intake. Pt has been non compliant with her medications in the past. All medications are reviewed with her. Spoke with her daughter as well. Will not start some of the blood pressure meds due to episodes of bradycardia and hypotension.   Current Medication: Outpatient Encounter Medications as of 09/16/2019  Medication Sig Note  . amLODipine (NORVASC) 5 MG tablet Take one tab po qd for htn   . brimonidine (ALPHAGAN) 0.2 % ophthalmic solution Place 1 drop into both eyes 2 (two) times daily. 09/03/2019: Has not started this med yet.  Marland Kitchen dexamethasone (DECADRON) 4 MG tablet Take 4 mg by mouth daily.    . Insulin Pen Needle (PEN NEEDLES) 31G X 8 MM MISC Use as directed with insulin E11.65   . Lancets Misc. (ACCU-CHEK MULTICLIX LANCET DEV) KIT by Does not apply route. Use as directed twice a day diag E11.65   . lidocaine-prilocaine (EMLA) cream    . lisinopril (ZESTRIL) 20 MG tablet Take one tab po qd for HTN   . prochlorperazine (COMPAZINE) 10 MG tablet Take 1 tablet (10 mg total) by mouth every 6 (six) hours as needed for nausea or vomiting.   . Semaglutide, 1 MG/DOSE, (OZEMPIC, 1 MG/DOSE,) 2 MG/1.5ML SOPN Inject 1 mg into the skin once a week.   . [DISCONTINUED] amLODipine (NORVASC) 10 MG tablet Take 1 tablet (10 mg total) by  mouth daily.   . [DISCONTINUED] glucose blood (ACCU-CHEK SMARTVIEW) test strip 1 each by Other route as needed for other. Use as directed twice a day diag E11.65   . [DISCONTINUED] hydrALAZINE (APRESOLINE) 50 MG tablet Take 1 tablet (50 mg total) by mouth 3 (three) times daily.   . [DISCONTINUED] insulin detemir (LEVEMIR) 100 UNIT/ML injection Inject 0.4 mLs (40 Units total) into the skin daily.   . [DISCONTINUED] lisinopril (ZESTRIL) 20 MG tablet Take 1 tablet (20 mg total) by mouth daily.   . [DISCONTINUED] Semaglutide, 1 MG/DOSE, (OZEMPIC, 1 MG/DOSE,) 2 MG/1.5ML SOPN Inject 1 mg into the skin once a week.   . Omega-3 Fatty Acids (FISH OIL) 1000 MG CAPS Take 1,000 mg by mouth daily.    Marland Kitchen SIMBRINZA 1-0.2 % SUSP    . [DISCONTINUED] insulin detemir (LEVEMIR) 100 unit/ml SOLN INJECT 44 UNITS IN AM SQ ( please calculate for a month)    No facility-administered encounter medications on file as of 09/16/2019.    Surgical History: Past Surgical History:  Procedure Laterality Date  . BREAST BIOPSY Right 07/05/2019   [place] bx venus marker, path pending  . BREAST BIOPSY Right 07/05/2019   LN bx, hydromarker, path pending  . CESAREAN SECTION    . COLONOSCOPY WITH PROPOFOL N/A 09/11/2015   Procedure: COLONOSCOPY WITH PROPOFOL;  Surgeon: Lucilla Lame, MD;  Location: [facility] ENDOSCOPY;  Service: Endoscopy;  Laterality: N/A;  . IR IMAGING  GUIDED PORT INSERTION  07/20/2019    Medical History: Past Medical History:  Diagnosis Date  . Asthma   . Cancer ([place])    breast  . COPD exacerbation ([place]) 04/12/2016  . Diabetes mellitus without complication ([place])   . Hyperlipemia   . Hypertension     Family History: Family History  Problem Relation Age of Onset  . Diabetes Mother   . Hypertension Mother   . Hypertension Father   . Diabetes Father     Social History   Socioeconomic History  . Marital status: Married    Spouse name: Not on file  . Number of children: Not on file  . Years of education:  Not on file  . Highest education level: Not on file  Occupational History  . Not on file  Tobacco Use  . Smoking status: Former Smoker    Packs/day: 1.00    Years: 2.00    Pack years: 2.00    Types: Cigarettes    Quit date: 05/19/2019    Years since quitting: 0.3  . Smokeless tobacco: Never Used  Substance and Sexual Activity  . Alcohol use: Yes    Alcohol/week: 2.0 standard drinks    Types: 2 Cans of beer per week    Comment: ocassionally   . Drug use: No  . Sexual activity: Never    Birth control/protection: Abstinence  Other Topics Concern  . Not on file  Social History Narrative  . Not on file   Social Determinants of Health   Financial Resource Strain:   . Difficulty of Paying Living Expenses:   Food Insecurity:   . Worried About Charity fundraiser in the Last Year:   . Arboriculturist in the Last Year:   Transportation Needs:   . Film/video editor (Medical):   Marland Kitchen Lack of Transportation (Non-Medical):   Physical Activity:   . Days of Exercise per Week:   . Minutes of Exercise per Session:   Stress:   . Feeling of Stress :   Social Connections:   . Frequency of Communication with Friends and Family:   . Frequency of Social Gatherings with Friends and Family:   . Attends Religious Services:   . Active Member of Clubs or Organizations:   . Attends Archivist Meetings:   Marland Kitchen Marital Status:   Intimate Partner Violence:   . Fear of Current or Ex-Partner:   . Emotionally Abused:   Marland Kitchen Physically Abused:   . Sexually Abused:     Review of Systems  Constitutional: Negative for chills, diaphoresis and fatigue.  HENT: Negative for ear pain, postnasal drip and sinus pressure.   Eyes: Negative for photophobia, discharge, redness, itching and visual disturbance.  Respiratory: Negative for cough, shortness of breath and wheezing.   Cardiovascular: Negative for chest pain, palpitations and leg swelling.  Gastrointestinal: Negative for abdominal pain,  constipation, diarrhea, nausea and vomiting.  Genitourinary: Negative for dysuria and flank pain.  Musculoskeletal: Negative for arthralgias, back pain, gait problem and neck pain.  Skin: Negative for color change.  Allergic/Immunologic: Negative for environmental allergies and food allergies.  Neurological: Positive for weakness. Negative for dizziness and headaches.  Hematological: Does not bruise/bleed easily.  Psychiatric/Behavioral: Negative for agitation, behavioral problems (depression) and hallucinations.    Vital Signs: BP 116/68   Pulse 87   Temp (!) 97.2 F (36.2 C)   Resp 16   Ht _0  (1.549 m)   Wt 175 lb (79.4 kg)  SpO2 100%   BMI 33.07 kg/m    Physical Exam Constitutional:      Appearance: Normal appearance.     Comments: Pt is poor historian   HENT:     Head: Normocephalic and atraumatic.     Mouth/Throat:     Mouth: Mucous membranes are moist.  Eyes:     Extraocular Movements: Extraocular movements intact.     Pupils: Pupils are equal, round, and reactive to light.  Cardiovascular:     Rate and Rhythm: Normal rate and regular rhythm.     Heart sounds: No murmur.  Pulmonary:     Breath sounds: No wheezing.  Skin:    General: Skin is warm and dry.  Neurological:     General: No focal deficit present.     Mental Status: She is alert and oriented to person, place, and time.  Psychiatric:        Mood and Affect: Mood normal.    Assessment/Plan: 1. Essential hypertension - Will restart few medications as prescribed today - lisinopril (ZESTRIL) 20 MG tablet; Take one tab po qd for HTN  Dispense: 30 tablet; Refill: 3 - amLODipine (NORVASC) 5 MG tablet; Take one tab po qd for htn  Dispense: 30 tablet; Refill: 3  2. Uncontrolled type 2 diabetes mellitus with hyperglycemia (H[facility]) - Increase Levemir to 44 units daily and monitor blood sugars  - Semaglutide, 1 MG/DOSE, (OZEMPIC, 1 MG/DOSE,) 2 MG/1.5ML SOPN; Inject 1 mg into the skin once a week.  Dispense:  5 pen; Refill: 3  3. Malignant neoplasm of upper-outer quadrant of right breast in female, estrogen receptor negative ([place]) - Per Oncology   General Counseling: Dawn Alvarez verbalizes understanding of the findings of todays visit and agrees with plan of treatment. I have discussed any further diagnostic evaluation that may be needed or ordered today. We also reviewed her medications today. she has been encouraged to call the office with any questions or concerns that should arise related to todays visit.  I have reviewed all medical records from hospital follow up including radiology reports and consults from other physicians. Appropriate follow up diagnostics will be scheduled as needed. Patient/ Family understands the plan of treatment. Time spent 35 minutes.   Dr Lavera Guise, MD Internal Medicine

## 2019-09-16 NOTE — Progress Notes (Signed)
Cardiology Office Note:    Date:  09/16/2019   ID:  EMMA-LEE ODDO, [place] 1955/10/05, MRN [id number]  PCP:  Lavera Guise, MD  Cardiologist:  Kate Sable, MD  Electrophysiologist:  None   Referring MD: Earlie Server, MD   Chief Complaint  Patient presents with  . OTHER    Syncope/cardiac clearance no complaints today. Meds reviewed verbally with pt.    History of Present Illness:    Dawn Alvarez is a 63 y.o. female with a hx of hypertension, diabetes, breast cancer on chemotherapy, former smoker x20 years who presents due to syncope.  Patient presented to the ED on 09/04/2019 due to a syncopal episode.  Patient was at home cooking and then suddenly passed out.  She denies any chest pain, palpitations, shortness of breath prior to passing out.  She denies losing consciousness, stating she was aware of falling.  She did feel little bit sweaty prior to fall.  She was brought to the emergency room and kept for 1 day for observation. Echocardiogram on 09/04/2019 showed normal systolic function with EF 60 to 65%, mild LVH, normal diastolic function.  Telemetry monitoring did not reveal any arrhythmias.  She had a similar episode roughly a year ago where she was at a friend's porch and suddenly passed out.  She denies any history of heart disease.  Currently undergoing chemo treatment at the cancer center for breast cancer.  Past Medical History:  Diagnosis Date  . Asthma   . Cancer ([place])    breast  . COPD exacerbation ([place]) 04/12/2016  . Diabetes mellitus without complication ([place])   . Hyperlipemia   . Hypertension     Past Surgical History:  Procedure Laterality Date  . BREAST BIOPSY Right 07/05/2019   [place] bx venus marker, path pending  . BREAST BIOPSY Right 07/05/2019   LN bx, hydromarker, path pending  . CESAREAN SECTION    . COLONOSCOPY WITH PROPOFOL N/A 09/11/2015   Procedure: COLONOSCOPY WITH PROPOFOL;  Surgeon: Lucilla Lame, MD;  Location: [facility] ENDOSCOPY;  Service: Endoscopy;   Laterality: N/A;  . IR IMAGING GUIDED PORT INSERTION  07/20/2019    Current Medications: Current Meds  Medication Sig  . amLODipine (NORVASC) 10 MG tablet Take 1 tablet (10 mg total) by mouth daily.  . brimonidine (ALPHAGAN) 0.2 % ophthalmic solution Place 1 drop into both eyes 2 (two) times daily.  Marland Kitchen dexamethasone (DECADRON) 4 MG tablet Take 4 mg by mouth daily.   Marland Kitchen glucose blood (ACCU-CHEK SMARTVIEW) test strip 1 each by Other route as needed for other. Use as directed twice a day diag E11.65  . hydrALAZINE (APRESOLINE) 50 MG tablet Take 1 tablet (50 mg total) by mouth 3 (three) times daily.  . insulin detemir (LEVEMIR) 100 UNIT/ML injection Inject 0.4 mLs (40 Units total) into the skin daily.  . Insulin Pen Needle (PEN NEEDLES) 31G X 8 MM MISC Use as directed with insulin E11.65  . Lancets Misc. (ACCU-CHEK MULTICLIX LANCET DEV) KIT by Does not apply route. Use as directed twice a day diag E11.65  . lidocaine-prilocaine (EMLA) cream   . lisinopril (ZESTRIL) 20 MG tablet Take 1 tablet (20 mg total) by mouth daily.  . Omega-3 Fatty Acids (FISH OIL) 1000 MG CAPS Take 1,000 mg by mouth daily.   . prochlorperazine (COMPAZINE) 10 MG tablet Take 1 tablet (10 mg total) by mouth every 6 (six) hours as needed for nausea or vomiting.  . Semaglutide, 1 MG/DOSE, (OZEMPIC, 1 MG/DOSE,) 2  MG/1.5ML SOPN Inject 1 mg into the skin once a week.  Marland Kitchen SIMBRINZA 1-0.2 % SUSP      Allergies:   Patient has no known allergies.   Social History   Socioeconomic History  . Marital status: Married    Spouse name: Not on file  . Number of children: Not on file  . Years of education: Not on file  . Highest education level: Not on file  Occupational History  . Not on file  Tobacco Use  . Smoking status: Former Smoker    Packs/day: 1.00    Years: 2.00    Pack years: 2.00    Types: Cigarettes    Quit date: 05/19/2019    Years since quitting: 0.3  . Smokeless tobacco: Never Used  Substance and Sexual Activity   . Alcohol use: Yes    Alcohol/week: 2.0 standard drinks    Types: 2 Cans of beer per week    Comment: ocassionally   . Drug use: No  . Sexual activity: Never    Birth control/protection: Abstinence  Other Topics Concern  . Not on file  Social History Narrative  . Not on file   Social Determinants of Health   Financial Resource Strain:   . Difficulty of Paying Living Expenses:   Food Insecurity:   . Worried About Charity fundraiser in the Last Year:   . Arboriculturist in the Last Year:   Transportation Needs:   . Film/video editor (Medical):   Marland Kitchen Lack of Transportation (Non-Medical):   Physical Activity:   . Days of Exercise per Week:   . Minutes of Exercise per Session:   Stress:   . Feeling of Stress :   Social Connections:   . Frequency of Communication with Friends and Family:   . Frequency of Social Gatherings with Friends and Family:   . Attends Religious Services:   . Active Member of Clubs or Organizations:   . Attends Archivist Meetings:   Marland Kitchen Marital Status:      Family History: The patient's family history includes Diabetes in her father and mother; Hypertension in her father and mother.  ROS:   Please see the history of present illness.     All other systems reviewed and are negative.  EKGs/Labs/Other Studies Reviewed:    The following studies were reviewed today:   EKG:  EKG is  ordered today.  The ekg ordered today demonstrates this tachycardia otherwise normal ECG.  Heart rate 102  Recent Labs: 09/03/2019: TSH 0.908 09/04/2019: Magnesium 2.2 09/15/2019: ALT 12; BUN 11; Creatinine, Ser 0.93; Hemoglobin 9.5; Platelets 226; Potassium 3.5; Sodium 136  Recent Lipid Panel    Component Value Date/Time   CHOL 227 (H) 03/16/2019 1046   CHOL 211 (H) 05/27/2013 0512   TRIG 216 (H) 03/16/2019 1046   TRIG 232 (H) 05/27/2013 0512   HDL 41 03/16/2019 1046   HDL 38 (L) 05/27/2013 0512   CHOLHDL 5.8 (H) 10/05/2017 0938   CHOLHDL 6.1 04/12/2016  0130   VLDL UNABLE TO CALCULATE IF TRIGLYCERIDE OVER 400 mg/dL 04/12/2016 0130   VLDL 46 (H) 05/27/2013 0512   LDLCALC 147 (H) 03/16/2019 1046   LDLCALC 127 (H) 05/27/2013 0512    Physical Exam:    VS:  BP 122/75 (BP Location: Right Arm, Patient Position: Sitting, Cuff Size: Normal)   Pulse (!) 102   Ht 5' 1" (1.549 m)   Wt 175 lb 2 oz (79.4 kg)  SpO2 98%   BMI 33.09 kg/m     Wt Readings from Last 3 Encounters:  09/16/19 175 lb (79.4 kg)  09/16/19 175 lb 2 oz (79.4 kg)  09/15/19 172 lb 6.4 oz (78.2 kg)     GEN:  Well nourished, well developed in no acute distress HEENT: Normal NECK: No JVD; No carotid bruits LYMPHATICS: No lymphadenopathy CARDIAC: RRR, 1/6 systolic murmur, rubs, gallops RESPIRATORY:  Clear to auscultation without rales, wheezing or rhonchi  ABDOMEN: Soft, non-tender, non-distended MUSCULOSKELETAL:  No edema; No deformity  SKIN: Warm and dry NEUROLOGIC:  Alert and oriented x 3 PSYCHIATRIC:  Normal affect   ASSESSMENT:    1. Syncope and collapse   2. Essential hypertension    PLAN:    In order of problems listed above:  1. Patient with an episode of syncope.  She denied any cardiac symptoms of palpitations, chest pain, shortness of breath prior to or after passing out.  She felt diaphoretic.  Her echocardiogram showed normal systolic and diastolic function, no structural abnormalities to suggest cardiac etiology.  Telemetry monitoring with no arrhythmias.  Will evaluate for any significant arrhythmias by placing a cardiac monitor for 2 weeks.  Symptoms are likely vasovagal.  There is no reason to withhold any therapy such as chemo from patient as echo did not reveal any gross structural abnormalities. 2. History of hypertension, BP well controlled.  Continue amlodipine as prescribed.  Follow-up after cardiac monitor.  This note was generated in part or whole with voice recognition software. Voice recognition is usually quite accurate but there are  transcription errors that can and very often do occur. I apologize for any typographical errors that were not detected and corrected.  Medication Adjustments/Labs and Tests Ordered: Current medicines are reviewed at length with the patient today.  Concerns regarding medicines are outlined above.  Orders Placed This Encounter  Procedures  . LONG TERM MONITOR (3-14 DAYS)  . EKG 12-Lead   No orders of the defined types were placed in this encounter.   Patient Instructions  Medication Instructions:  - Your physician recommends that you continue on your current medications as directed. Please refer to the Current Medication list given to you today.  *If you need a refill on your cardiac medications before your next appointment, please call your pharmacy*   Lab Work: - none ordered  If you have labs (blood work) drawn today and your tests are completely normal, you will receive your results only by: Marland Kitchen MyChart Message (if you have MyChart) OR . A paper copy in the mail If you have any lab test that is abnormal or we need to change your treatment, we will call you to review the results.   Testing/Procedures: -Your physician has recommended that you wear a 14 day Zio (heart) monitor- placed in clinic today. This monitor is a medical device that records the heart's electrical activity. Doctors most often use these monitors to diagnose arrhythmias. Arrhythmias are problems with the speed or rhythm of the heartbeat. The monitor is a small device applied to your chest. You can wear one while you do your normal daily activities. While wearing this monitor if you have any symptoms to push the button and record what you felt. Once you have worn this monitor for the period of time provider prescribed (Usually 14 days), you will return the monitor device in the postage paid box. Once it is returned they will download the data collected and provide [place] with a report which  the provider will then review and  we will call you with those results. Important tips:  1. Avoid showering during the first 24 hours of wearing the monitor. 2. Avoid excessive sweating to help maximize wear time. 3. Do not submerge the device, no hot tubs, and no swimming pools. 4. Keep any lotions or oils away from the patch. 5. After 24 hours you may shower with the patch on. Take brief showers with your back facing the shower head.  6. Do not remove patch once it has been placed because that will interrupt data and decrease adhesive wear time. 7. Push the button when you have any symptoms and write down what you were feeling. 8. Once you have completed wearing your monitor, remove and place into box which has postage paid and place in your outgoing mailbox.  9. If for some reason you have misplaced your box then call our office and we can provide another box and/or mail it off for you.       Follow-Up: At [facility], you and your health needs are our priority.  As part of our continuing mission to provide you with exceptional heart care, we have created designated Provider Care Teams.  These Care Teams include your primary Cardiologist (physician) and Advanced Practice Providers (APPs -  Physician Assistants and Nurse Practitioners) who all work together to provide you with the care you need, when you need it.  We recommend signing up for the patient portal called "MyChart".  Sign up information is provided on this After Visit Summary.  MyChart is used to connect with patients for Virtual Visits (Telemedicine).  Patients are able to view lab/test results, encounter notes, upcoming appointments, etc.  Non-urgent messages can be sent to your provider as well.   To learn more about what you can do with MyChart, go to [email or web address].    Your next appointment:   1 month(s)  The format for your next appointment:   In Person  Provider:   Kate Sable, MD   Other Instructions n/a     Signed, Kate Sable, MD  09/16/2019 12:00 PM    [place]

## 2019-09-16 NOTE — Patient Instructions (Signed)
Medication Instructions:  - Your physician recommends that you continue on your current medications as directed. Please refer to the Current Medication list given to you today.  *If you need a refill on your cardiac medications before your next appointment, please call your pharmacy*   Lab Work: - none ordered  If you have labs (blood work) drawn today and your tests are completely normal, you will receive your results only by: Marland Kitchen MyChart Message (if you have MyChart) OR . A paper copy in the mail If you have any lab test that is abnormal or we need to change your treatment, we will call you to review the results.   Testing/Procedures: -Your physician has recommended that you wear a 14 day Zio (heart) monitor- placed in clinic today. This monitor is a medical device that records the heart's electrical activity. Doctors most often use these monitors to diagnose arrhythmias. Arrhythmias are problems with the speed or rhythm of the heartbeat. The monitor is a small device applied to your chest. You can wear one while you do your normal daily activities. While wearing this monitor if you have any symptoms to push the button and record what you felt. Once you have worn this monitor for the period of time provider prescribed (Usually 14 days), you will return the monitor device in the postage paid box. Once it is returned they will download the data collected and provide [place] with a report which the provider will then review and we will call you with those results. Important tips:  1. Avoid showering during the first 24 hours of wearing the monitor. 2. Avoid excessive sweating to help maximize wear time. 3. Do not submerge the device, no hot tubs, and no swimming pools. 4. Keep any lotions or oils away from the patch. 5. After 24 hours you may shower with the patch on. Take brief showers with your back facing the shower head.  6. Do not remove patch once it has been placed because that will interrupt  data and decrease adhesive wear time. 7. Push the button when you have any symptoms and write down what you were feeling. 8. Once you have completed wearing your monitor, remove and place into box which has postage paid and place in your outgoing mailbox.  9. If for some reason you have misplaced your box then call our office and we can provide another box and/or mail it off for you.       Follow-Up: At [facility], you and your health needs are our priority.  As part of our continuing mission to provide you with exceptional heart care, we have created designated Provider Care Teams.  These Care Teams include your primary Cardiologist (physician) and Advanced Practice Providers (APPs -  Physician Assistants and Nurse Practitioners) who all work together to provide you with the care you need, when you need it.  We recommend signing up for the patient portal called "MyChart".  Sign up information is provided on this After Visit Summary.  MyChart is used to connect with patients for Virtual Visits (Telemedicine).  Patients are able to view lab/test results, encounter notes, upcoming appointments, etc.  Non-urgent messages can be sent to your provider as well.   To learn more about what you can do with MyChart, go to [email or web address].    Your next appointment:   1 month(s)  The format for your next appointment:   In Person  Provider:   Kate Sable, MD   Other Instructions n/a

## 2019-09-16 NOTE — Telephone Encounter (Signed)
Error

## 2019-09-20 ENCOUNTER — Telehealth: Payer: Self-pay

## 2019-09-20 NOTE — Telephone Encounter (Signed)
[place] message received from [facility] stating patient's daughter called to let Dr. Tasia Catchings know that patient has been "cleared for chemo" and would like to know when she needs to return for tx  Dr. Tasia Catchings has reviewed recent cardiology notes and would like her to be scheduled this week for lab/MD/DDAC; fulphila day 2.  Please call patient's daughter with appt details [phone])

## 2019-09-21 ENCOUNTER — Encounter: Payer: Self-pay | Admitting: Oncology

## 2019-09-21 ENCOUNTER — Inpatient Hospital Stay: Payer: Medicare HMO

## 2019-09-21 ENCOUNTER — Other Ambulatory Visit: Payer: Self-pay

## 2019-09-21 ENCOUNTER — Inpatient Hospital Stay (HOSPITAL_BASED_OUTPATIENT_CLINIC_OR_DEPARTMENT_OTHER): Payer: Medicare HMO | Admitting: Oncology

## 2019-09-21 VITALS — BP 115/92 | HR 99 | Temp 94.8°F | Resp 18 | Wt 167.6 lb

## 2019-09-21 DIAGNOSIS — C50411 Malignant neoplasm of upper-outer quadrant of right female breast: Secondary | ICD-10-CM

## 2019-09-21 DIAGNOSIS — R55 Syncope and collapse: Secondary | ICD-10-CM

## 2019-09-21 DIAGNOSIS — Z794 Long term (current) use of insulin: Secondary | ICD-10-CM | POA: Diagnosis not present

## 2019-09-21 DIAGNOSIS — Z79899 Other long term (current) drug therapy: Secondary | ICD-10-CM | POA: Diagnosis not present

## 2019-09-21 DIAGNOSIS — Z5189 Encounter for other specified aftercare: Secondary | ICD-10-CM | POA: Diagnosis not present

## 2019-09-21 DIAGNOSIS — E11649 Type 2 diabetes mellitus with hypoglycemia without coma: Secondary | ICD-10-CM | POA: Diagnosis not present

## 2019-09-21 DIAGNOSIS — Z87891 Personal history of nicotine dependence: Secondary | ICD-10-CM | POA: Diagnosis not present

## 2019-09-21 DIAGNOSIS — E119 Type 2 diabetes mellitus without complications: Secondary | ICD-10-CM | POA: Diagnosis not present

## 2019-09-21 DIAGNOSIS — Z5111 Encounter for antineoplastic chemotherapy: Secondary | ICD-10-CM | POA: Diagnosis not present

## 2019-09-21 LAB — CBC WITH DIFFERENTIAL/PLATELET
Abs Immature Granulocytes: 0.05 10*3/uL (ref 0.00–0.07)
Basophils Absolute: 0.1 10*3/uL (ref 0.0–0.1)
Basophils Relative: 1 %
Eosinophils Absolute: 0.3 10*3/uL (ref 0.0–0.5)
Eosinophils Relative: 3 %
HCT: 31.5 % — ABNORMAL LOW (ref 36.0–46.0)
Hemoglobin: 10.6 g/dL — ABNORMAL LOW (ref 12.0–15.0)
Immature Granulocytes: 1 %
Lymphocytes Relative: 18 %
Lymphs Abs: 1.5 10*3/uL (ref 0.7–4.0)
MCH: 28.6 pg (ref 26.0–34.0)
MCHC: 33.7 g/dL (ref 30.0–36.0)
MCV: 84.9 fL (ref 80.0–100.0)
Monocytes Absolute: 1 10*3/uL (ref 0.1–1.0)
Monocytes Relative: 12 %
Neutro Abs: 5.6 10*3/uL (ref 1.7–7.7)
Neutrophils Relative %: 65 %
Platelets: 372 10*3/uL (ref 150–400)
RBC: 3.71 MIL/uL — ABNORMAL LOW (ref 3.87–5.11)
RDW: 17.3 % — ABNORMAL HIGH (ref 11.5–15.5)
WBC: 8.6 10*3/uL (ref 4.0–10.5)
nRBC: 0 % (ref 0.0–0.2)

## 2019-09-21 LAB — COMPREHENSIVE METABOLIC PANEL
ALT: 15 U/L (ref 0–44)
AST: 19 U/L (ref 15–41)
Albumin: 3.9 g/dL (ref 3.5–5.0)
Alkaline Phosphatase: 42 U/L (ref 38–126)
Anion gap: 11 (ref 5–15)
BUN: 15 mg/dL (ref 8–23)
CO2: 23 mmol/L (ref 22–32)
Calcium: 9.2 mg/dL (ref 8.9–10.3)
Chloride: 105 mmol/L (ref 98–111)
Creatinine, Ser: 0.92 mg/dL (ref 0.44–1.00)
GFR calc Af Amer: >60 mL/min (ref >60)
GFR calc non Af Amer: >60 mL/min (ref >60)
Glucose, Bld: 110 mg/dL — ABNORMAL HIGH (ref 70–99)
Potassium: 3.7 mmol/L (ref 3.5–5.1)
Sodium: 139 mmol/L (ref 135–145)
Total Bilirubin: 0.5 mg/dL (ref 0.3–1.2)
Total Protein: 7.2 g/dL (ref 6.5–8.1)

## 2019-09-21 MED ORDER — DOXORUBICIN HCL CHEMO IV INJECTION 2 MG/ML
60.0000 mg/m2 | Freq: Once | INTRAVENOUS | Status: AC
  Administered 2019-09-21: 114 mg via INTRAVENOUS
  Filled 2019-09-21: qty 50

## 2019-09-21 MED ORDER — SODIUM CHLORIDE 0.9% FLUSH
10.0000 mL | INTRAVENOUS | Status: DC | PRN
  Administered 2019-09-21: 10 mL via INTRAVENOUS
  Filled 2019-09-21: qty 10

## 2019-09-21 MED ORDER — HEPARIN SOD (PORK) LOCK FLUSH 100 UNIT/ML IV SOLN
INTRAVENOUS | Status: AC
  Filled 2019-09-21: qty 5

## 2019-09-21 MED ORDER — HEPARIN SOD (PORK) LOCK FLUSH 100 UNIT/ML IV SOLN
500.0000 [IU] | Freq: Once | INTRAVENOUS | Status: DC
  Filled 2019-09-21: qty 5

## 2019-09-21 MED ORDER — SODIUM CHLORIDE 0.9 % IV SOLN
150.0000 mg | Freq: Once | INTRAVENOUS | Status: AC
  Administered 2019-09-21: 150 mg via INTRAVENOUS
  Filled 2019-09-21: qty 5

## 2019-09-21 MED ORDER — PALONOSETRON HCL INJECTION 0.25 MG/5ML
0.2500 mg | Freq: Once | INTRAVENOUS | Status: AC
  Administered 2019-09-21: 0.25 mg via INTRAVENOUS
  Filled 2019-09-21: qty 5

## 2019-09-21 MED ORDER — SODIUM CHLORIDE 0.9 % IV SOLN
10.0000 mg | Freq: Once | INTRAVENOUS | Status: AC
  Administered 2019-09-21: 10 mg via INTRAVENOUS
  Filled 2019-09-21: qty 10

## 2019-09-21 MED ORDER — HEPARIN SOD (PORK) LOCK FLUSH 100 UNIT/ML IV SOLN
500.0000 [IU] | Freq: Once | INTRAVENOUS | Status: AC | PRN
  Administered 2019-09-21: 500 [IU]
  Filled 2019-09-21: qty 5

## 2019-09-21 MED ORDER — SODIUM CHLORIDE 0.9 % IV SOLN
Freq: Once | INTRAVENOUS | Status: AC
  Filled 2019-09-21: qty 250

## 2019-09-21 MED ORDER — SODIUM CHLORIDE 0.9 % IV SOLN
600.0000 mg/m2 | Freq: Once | INTRAVENOUS | Status: AC
  Administered 2019-09-21: 1140 mg via INTRAVENOUS
  Filled 2019-09-21: qty 57

## 2019-09-21 NOTE — Progress Notes (Signed)
Patient here for follow up. Pt saw cardiologist last week and has been on heart monitor since then.

## 2019-09-22 ENCOUNTER — Inpatient Hospital Stay: Payer: Medicare HMO

## 2019-09-22 ENCOUNTER — Encounter: Payer: Self-pay | Admitting: *Deleted

## 2019-09-22 DIAGNOSIS — Z5189 Encounter for other specified aftercare: Secondary | ICD-10-CM | POA: Diagnosis not present

## 2019-09-22 DIAGNOSIS — Z794 Long term (current) use of insulin: Secondary | ICD-10-CM | POA: Diagnosis not present

## 2019-09-22 DIAGNOSIS — Z87891 Personal history of nicotine dependence: Secondary | ICD-10-CM | POA: Diagnosis not present

## 2019-09-22 DIAGNOSIS — Z79899 Other long term (current) drug therapy: Secondary | ICD-10-CM | POA: Diagnosis not present

## 2019-09-22 DIAGNOSIS — R55 Syncope and collapse: Secondary | ICD-10-CM | POA: Diagnosis not present

## 2019-09-22 DIAGNOSIS — E119 Type 2 diabetes mellitus without complications: Secondary | ICD-10-CM | POA: Diagnosis not present

## 2019-09-22 DIAGNOSIS — C50411 Malignant neoplasm of upper-outer quadrant of right female breast: Secondary | ICD-10-CM

## 2019-09-22 DIAGNOSIS — Z5111 Encounter for antineoplastic chemotherapy: Secondary | ICD-10-CM | POA: Diagnosis not present

## 2019-09-22 DIAGNOSIS — E11649 Type 2 diabetes mellitus with hypoglycemia without coma: Secondary | ICD-10-CM | POA: Diagnosis not present

## 2019-09-22 MED ORDER — PEGFILGRASTIM-JMDB 6 MG/0.6ML ~~LOC~~ SOSY
6.0000 mg | PREFILLED_SYRINGE | Freq: Once | SUBCUTANEOUS | Status: AC
  Administered 2019-09-22: 6 mg via SUBCUTANEOUS
  Filled 2019-09-22: qty 0.6

## 2019-09-22 NOTE — Progress Notes (Signed)
Hematology/Oncology follow up note [facility] [phone] [phone]   Patient Care Team: Lavera Guise, MD as PCP - General (Internal Medicine) Kate Sable, MD as PCP - Cardiology (Cardiology) Rico Junker, RN as Registered Nurse Theodore Demark, RN as Registered Nurse  REFERRING PROVIDER: Lavera Guise, MD  CHIEF COMPLAINTS/REASON FOR VISIT:  Follow-up for breast cancer  HISTORY OF PRESENTING ILLNESS:   Dawn Alvarez is a  64 y.o.  female with PMH listed below was seen in consultation at the request of  Lavera Guise, MD  for evaluation of breast cancer Patient had screening mammogram done 08/12/2017 which showed right breast asymmetry.  A diagnostic mammogram was suggested and patient did not have it done. Patient felt her right breast mass for a few weeks.  She had bilateral diagnostic mammogram done on 06/30/2019. 2.8 x 2.4 x 2.6 cm right breast mass, 11:00, 10 cm from the nipple.  There is a single mildly abnormal node in the right axilla with a cortex measuring up to 4.4 mm.  No other suspicious findings. Patient underwent ultrasound-guided core biopsy of the right breast mass and right axilla lymph node Pathology showed invasive mammary carcinoma, no special type, grade 3, ER/PR HER-2 status are pending. Right axillary lymph node biopsy showed predominantly blood in the fibroadipose tissue, with scant lymphoid tissue present.  No definite malignancy was identified.  Patient was referred to cancer center to establish care and discuss treatment plan. Menarche 43 Postmenopausal.  LMP when she was 64 years old. She recalls use of birth control pills. Denies any hormone .  Replacement therapy. Denies any prior chest radiation. She reports family history of maternal grandmother and 2 maternal cousins were diagnosed with cancer.  She does not know about details. # 09/03/2019, patient had an episode of syncope and EMS was called  and patient sent to emergency room.  Patient was noted to have a heart rate 45-65 and blood glucose level of 286. Patient denies any history of nausea, vomiting, diarrhea.  Denies any dehydration.  She reported history of intermittent abdominal pain. CT head without contrast showed no acute intracranial abnormality.  CT abdomen pelvis with contrast is negative for evidence of acute abdominal abnormality.  Patient was given IV fluid. Patient was observed with holding her blood pressure medication including amlodipine, bisoprolol.  No arrhythmia was noted on telemetry.  Echocardiogram showed LVEF 60 to 65%.  No valvular abnormalities.  Beta-blocker and diuretics was discontinued.   INTERVAL HISTORY Dawn Alvarez is a 64 y.o. female who has above history reviewed by me today presents for follow up visit for management of breast cancer, evaluation prior to chemotherapy. Problems and complaints are listed below: Patient was seen by cardiology Dr. Garen Lah for evaluation of syncope and clearance for chemotherapy..  Today patient was accompanied by her daughter for follow-up prior to chemotherapy.  Cardiology felt that syncope was likely a vasovagal event.  Cardiology cleared the patient to continue with chemotherapy. Patient has no new complaints.  Review of Systems  Constitutional: Positive for fatigue. Negative for appetite change, chills and fever.  HENT:   Negative for hearing loss and voice change.   Eyes: Negative for eye problems.  Respiratory: Negative for chest tightness and cough.   Cardiovascular: Negative for chest pain.  Gastrointestinal: Negative for abdominal distention, abdominal pain and blood in stool.  Endocrine: Negative for hot flashes.  Genitourinary: Negative for difficulty urinating and frequency.   Musculoskeletal: Negative for  arthralgias.  Skin: Negative for itching and rash.  Neurological: Negative for extremity weakness.  Hematological: Negative for adenopathy.    Psychiatric/Behavioral: Negative for confusion.    MEDICAL HISTORY:  Past Medical History:  Diagnosis Date  . Asthma   . Cancer ([place])    breast  . COPD exacerbation ([place]) 04/12/2016  . Diabetes mellitus without complication ([place])   . Hyperlipemia   . Hypertension     SURGICAL HISTORY: Past Surgical History:  Procedure Laterality Date  . BREAST BIOPSY Right 07/05/2019   [place] bx venus marker, path pending  . BREAST BIOPSY Right 07/05/2019   LN bx, hydromarker, path pending  . CESAREAN SECTION    . COLONOSCOPY WITH PROPOFOL N/A 09/11/2015   Procedure: COLONOSCOPY WITH PROPOFOL;  Surgeon: Lucilla Lame, MD;  Location: [facility] ENDOSCOPY;  Service: Endoscopy;  Laterality: N/A;  . IR IMAGING GUIDED PORT INSERTION  07/20/2019    SOCIAL HISTORY: Social History   Socioeconomic History  . Marital status: Married    Spouse name: Not on file  . Number of children: Not on file  . Years of education: Not on file  . Highest education level: Not on file  Occupational History  . Not on file  Tobacco Use  . Smoking status: Former Smoker    Packs/day: 1.00    Years: 2.00    Pack years: 2.00    Types: Cigarettes    Quit date: 05/19/2019    Years since quitting: 0.3  . Smokeless tobacco: Never Used  Substance and Sexual Activity  . Alcohol use: Yes    Alcohol/week: 2.0 standard drinks    Types: 2 Cans of beer per week    Comment: ocassionally   . Drug use: No  . Sexual activity: Never    Birth control/protection: Abstinence  Other Topics Concern  . Not on file  Social History Narrative  . Not on file   Social Determinants of Health   Financial Resource Strain:   . Difficulty of Paying Living Expenses:   Food Insecurity:   . Worried About Charity fundraiser in the Last Year:   . Arboriculturist in the Last Year:   Transportation Needs:   . Film/video editor (Medical):   Marland Kitchen Lack of Transportation (Non-Medical):   Physical Activity:   . Days of Exercise per Week:   .  Minutes of Exercise per Session:   Stress:   . Feeling of Stress :   Social Connections:   . Frequency of Communication with Friends and Family:   . Frequency of Social Gatherings with Friends and Family:   . Attends Religious Services:   . Active Member of Clubs or Organizations:   . Attends Archivist Meetings:   Marland Kitchen Marital Status:   Intimate Partner Violence:   . Fear of Current or Ex-Partner:   . Emotionally Abused:   Marland Kitchen Physically Abused:   . Sexually Abused:     FAMILY HISTORY: Family History  Problem Relation Age of Onset  . Diabetes Mother   . Hypertension Mother   . Hypertension Father   . Diabetes Father     ALLERGIES:  has No Known Allergies.  MEDICATIONS:  Current Outpatient Medications  Medication Sig Dispense Refill  . Alcohol Swabs (B-D SINGLE USE SWABS REGULAR) PADS Use as directed twice a day 300 each 3  . amLODipine (NORVASC) 5 MG tablet Take one tab po qd for htn 30 tablet 3  . Blood Glucose Monitoring Suppl (TRUE  METRIX AIR GLUCOSE METER) w/Device KIT 1 Device by Does not apply route in the morning and at bedtime. Use ad directed e11.65 1 kit 0  . brimonidine (ALPHAGAN) 0.2 % ophthalmic solution Place 1 drop into both eyes 2 (two) times daily.    Marland Kitchen dexamethasone (DECADRON) 4 MG tablet Take 4 mg by mouth daily.     Marland Kitchen glucose blood (TRUE METRIX BLOOD GLUCOSE TEST) test strip Use as instructed twice a daily diag e11.65 100 each 3  . insulin detemir (LEVEMIR FLEXTOUCH) 100 UNIT/ML FlexPen Inject 44 Units into the skin daily. 15 mL 3  . Insulin Pen Needle (PEN NEEDLES) 31G X 8 MM MISC Use as directed with insulin E11.65 100 each 1  . Lancets Misc. (ACCU-CHEK MULTICLIX LANCET DEV) KIT by Does not apply route. Use as directed twice a day diag E11.65    . lidocaine-prilocaine (EMLA) cream     . lisinopril (ZESTRIL) 20 MG tablet Take one tab po qd for HTN 30 tablet 3  . Omega-3 Fatty Acids (FISH OIL) 1000 MG CAPS Take 1,000 mg by mouth daily.     .  prochlorperazine (COMPAZINE) 10 MG tablet Take 1 tablet (10 mg total) by mouth every 6 (six) hours as needed for nausea or vomiting. 60 tablet 1  . Semaglutide, 1 MG/DOSE, (OZEMPIC, 1 MG/DOSE,) 2 MG/1.5ML SOPN Inject 1 mg into the skin once a week. 5 pen 3  . SIMBRINZA 1-0.2 % SUSP     . TRUEplus Lancets 30G MISC Use as directed twice daily diag e11.65 100 each 3   No current facility-administered medications for this visit.     PHYSICAL EXAMINATION: ECOG PERFORMANCE STATUS: 1 - Symptomatic but completely ambulatory Vitals:   09/21/19 1214  BP: (!) 115/92  Pulse: 99  Resp: 18  Temp: (!) 94.8 F (34.9 C)   Filed Weights   09/21/19 1214  Weight: 167 lb 9.6 oz (76 kg)    Physical Exam Constitutional:      General: She is not in acute distress. HENT:     Head: Normocephalic and atraumatic.  Eyes:     General: No scleral icterus. Cardiovascular:     Rate and Rhythm: Normal rate and regular rhythm.     Heart sounds: Normal heart sounds.  Pulmonary:     Effort: Pulmonary effort is normal. No respiratory distress.     Breath sounds: No wheezing.  Abdominal:     General: Bowel sounds are normal. There is no distension.     Palpations: Abdomen is soft.  Musculoskeletal:        General: No deformity. Normal range of motion.     Cervical back: Normal range of motion and neck supple.  Skin:    General: Skin is warm and dry.     Findings: No erythema or rash.  Neurological:     Mental Status: She is alert and oriented to person, place, and time. Mental status is at baseline.     Cranial Nerves: No cranial nerve deficit.     Coordination: Coordination normal.  Psychiatric:        Mood and Affect: Mood normal.    Right breast examination showed decreased size of 11:00 mass.  No palpable axillary lymphadenopathy.   LABORATORY DATA:  I have reviewed the data as listed Lab Results  Component Value Date   WBC 8.6 09/21/2019   HGB 10.6 (L) 09/21/2019   HCT 31.5 (L)  09/21/2019   MCV 84.9 09/21/2019   PLT 372  09/21/2019   Recent Labs    09/03/19 1825 09/03/19 1825 09/04/19 0505 09/15/19 0851 09/21/19 1120  NA 140   < > 142 136 139  K 3.4*   < > 4.6 3.5 3.7  CL 107   < > 110 103 105  CO2 22   < > _0 GLUCOSE 253*   < > 202* 211* 110*  BUN 27*   < > _1 CREATININE 1.01*   < > 0.80 0.93 0.92  CALCIUM 9.2   < > 9.3 8.7* 9.2  GFRNONAA 59*   < > >60 >60 >60  GFRAA >60   < > >60 >60 >60  PROT 6.4*  --   --  6.4* 7.2  ALBUMIN 3.5  --   --  3.6 3.9  AST 14*  --   --  16 19  ALT 12  --   --  12 15  ALKPHOS 62  --   --  52 42  BILITOT 0.7  --   --  0.5 0.5   < > = values in this interval not displayed.   Iron/TIBC/Ferritin/ %Sat No results found for: IRON, TIBC, FERRITIN, IRONPCTSAT    RADIOGRAPHIC STUDIES: I have personally reviewed the radiological images as listed and agreed with the findings in the report. CT Head Wo Contrast  Result Date: 09/03/2019 CLINICAL DATA:  Encephalopathy. EXAM: CT HEAD WITHOUT CONTRAST TECHNIQUE: Contiguous axial images were obtained from the base of the skull through the vertex without intravenous contrast. COMPARISON:  Head CT 07/30/2019 FINDINGS: Brain: Brain volume is normal for age. No intracranial hemorrhage, mass effect, or midline shift. No hydrocephalus. The basilar cisterns are patent. Unchanged periventricular and deep chronic small vessel ischemia. Punctate lacunar infarct in the right thalamus. No evidence of territorial infarct or acute ischemia. No extra-axial or intracranial fluid collection. Vascular: No hyperdense vessel. Skull: Normal. Negative for fracture or focal lesion. Sinuses/Orbits: Paranasal sinuses and mastoid air cells are clear. The visualized orbits are unremarkable. Other: None. IMPRESSION: 1. No acute intracranial abnormality. 2. Stable chronic small vessel ischemia from prior. Electronically Signed   By: Keith Rake M.D.   On: 09/03/2019 19:53   CT Head Wo  Contrast  Result Date: 07/30/2019 CLINICAL DATA:  64 year old female with altered mental status EXAM: CT HEAD WITHOUT CONTRAST TECHNIQUE: Contiguous axial images were obtained from the base of the skull through the vertex without intravenous contrast. COMPARISON:  MR 05/04/2010, CT 12/28/2018 FINDINGS: Brain: No acute intracranial hemorrhage. No midline shift or mass effect. Gray-white differentiation maintained. Confluent hypodensity in the periventricular white matter, similar to the prior MR and CT. Unremarkable appearance of the ventricular system. Vascular: Mild atherosclerotic calcifications. Skull: No acute fracture.  No aggressive bone lesion identified. Sinuses/Orbits: Unremarkable appearance of the orbits. Mastoid air cells clear. No middle ear effusion. No significant sinus disease. Other: None IMPRESSION: Negative for acute intracranial abnormality. Chronic microvascular ischemic disease. Electronically Signed   By: Corrie Mckusick D.O.   On: 07/30/2019 14:46   NM Cardiac Muga Rest  Result Date: 07/25/2019 CLINICAL DATA:  Breast cancer. Evaluate cardiac function in relation to chemotherapy. EXAM: NUCLEAR MEDICINE CARDIAC BLOOD POOL IMAGING (MUGA) TECHNIQUE: Cardiac multi-gated acquisition was performed at rest following intravenous injection of Tc-43mlabeled red blood cells. RADIOPHARMACEUTICALS:  22.8 mCi Tc-984mertechnetate in-vitro labeled red blood cells IV COMPARISON:  None FINDINGS: No  focal wall motion abnormality of the left ventricle. Calculated left ventricular ejection fraction equals 74 %  IMPRESSION: Left ventricular ejection fraction equals74 %. Electronically Signed   By: Suzy Bouchard M.D.   On: 07/25/2019 15:16   CT ABDOMEN PELVIS W CONTRAST  Result Date: 09/03/2019 CLINICAL DATA:  Nausea vomiting. EXAM: CT ABDOMEN AND PELVIS WITH CONTRAST TECHNIQUE: Multidetector CT imaging of the abdomen and pelvis was performed using the standard protocol following bolus administration of  intravenous contrast. CONTRAST:  144m OMNIPAQUE IOHEXOL 300 MG/ML  SOLN COMPARISON:  April 20, 2016 FINDINGS: Lower chest: No acute abnormality. Hepatobiliary: No focal liver abnormality is seen. No gallstones, gallbladder wall thickening, or biliary dilatation. Pancreas: Unremarkable. No pancreatic ductal dilatation or surrounding inflammatory changes. Spleen: Normal in size without focal abnormality. Adrenals/Urinary Tract: Adrenal glands are unremarkable. Kidneys are normal, without renal calculi, solid lesion, or hydronephrosis. 2 cm right renal cyst. Bladder is unremarkable. Stomach/Bowel: Stomach is within normal limits. Appendix appears normal. No evidence of bowel wall thickening, distention, or inflammatory changes. Vascular/Lymphatic: Aortic atherosclerosis. No enlarged abdominal or pelvic lymph nodes. Reproductive: Numerous some calcified myometrial masses. Other: No abdominal wall hernia or abnormality. No abdominopelvic ascites. Musculoskeletal: L4-L5 spondylosis. IMPRESSION: 1. No evidence of acute abnormalities within the solid abdominal organs. 2. Myometrial fibroids. Aortic Atherosclerosis (ICD10-I70.0). Electronically Signed   By: DFidela SalisburyM.D.   On: 09/03/2019 19:54   [place]BREAST LTD UNI RIGHT INC AXILLA  Result Date: 06/30/2019 CLINICAL DATA:  Palpable lump in the right breast EXAM: DIGITAL DIAGNOSTIC BILATERAL MAMMOGRAM WITH CAD AND TOMO ULTRASOUND RIGHT BREAST COMPARISON:  Previous exam(s). ACR Breast Density Category b: There are scattered areas of fibroglandular density. FINDINGS: There is a spiculated mass in the region of the patient's palpable lump measuring 3.8 cm mammographically. No other suspicious findings are identified in either breast. Mammographic images were processed with CAD. On physical exam, there is a palpable lump in the right breast at 11 o'clock, 10 cm from the nipple. Targeted ultrasound is performed, showing an irregular hypoechoic mass at 11 o'clock,  10 cm from the nipple measuring 2.8 x 2.4 x 2.6 cm. There is a single mildly abnormal node in the low right axilla with a cortex measuring up to 4.4 mm. No other suspicious findings. IMPRESSION: Highly suspicious right breast mass. Mildly abnormal right axillary node. No abnormalities on the left. RECOMMENDATION: Recommend ultrasound-guided biopsy of the right breast mass and a mildly abnormal right axillary node. I have discussed the findings and recommendations with the patient. If applicable, a reminder letter will be sent to the patient regarding the next appointment. BI-RADS CATEGORY  5: Highly suggestive of malignancy. Electronically Signed   By: DDorise BullionIII M.D   On: 06/30/2019 12:19   MM DIAG BREAST TOMO BILATERAL  Result Date: 06/30/2019 CLINICAL DATA:  Palpable lump in the right breast EXAM: DIGITAL DIAGNOSTIC BILATERAL MAMMOGRAM WITH CAD AND TOMO ULTRASOUND RIGHT BREAST COMPARISON:  Previous exam(s). ACR Breast Density Category b: There are scattered areas of fibroglandular density. FINDINGS: There is a spiculated mass in the region of the patient's palpable lump measuring 3.8 cm mammographically. No other suspicious findings are identified in either breast. Mammographic images were processed with CAD. On physical exam, there is a palpable lump in the right breast at 11 o'clock, 10 cm from the nipple. Targeted ultrasound is performed, showing an irregular hypoechoic mass at 11 o'clock, 10 cm from the nipple measuring 2.8 x 2.4 x 2.6 cm. There is a single mildly abnormal node in the low right axilla with a cortex measuring up to 4.4 mm. No  other suspicious findings. IMPRESSION: Highly suspicious right breast mass. Mildly abnormal right axillary node. No abnormalities on the left. RECOMMENDATION: Recommend ultrasound-guided biopsy of the right breast mass and a mildly abnormal right axillary node. I have discussed the findings and recommendations with the patient. If applicable, a reminder  letter will be sent to the patient regarding the next appointment. BI-RADS CATEGORY  5: Highly suggestive of malignancy. Electronically Signed   By: Dorise Bullion III M.D   On: 06/30/2019 12:19   ECHOCARDIOGRAM COMPLETE  Result Date: 09/04/2019    ECHOCARDIOGRAM REPORT   Patient Name:   Dawn Alvarez Date of Exam: 09/04/2019 Medical Rec #:  [id number]        Height:       61.0 in Accession #:    [id number]       Weight:       169.2 lb Date of Birth:  January 16, 1956        BSA:          1.759 m Patient Age:    64 years         BP:           175/83 mmHg Patient Gender: F                HR:           67 bpm. Exam Location:  [facility] Procedure: 2D Echo Indications:     Syncope 780.2/ R55  History:         Patient has no prior history of Echocardiogram examinations.  Sonographer:     Arville Go RDCS Referring Phys:  [place] Diagnosing Phys: Ida Rogue MD IMPRESSIONS  1. Left ventricular ejection fraction, by estimation, is 60 to 65%. The left ventricle has normal function. The left ventricle has no regional wall motion abnormalities. There is mild left ventricular hypertrophy. Left ventricular diastolic parameters were normal.  2. Right ventricular systolic function is normal. The right ventricular size is normal. Tricuspid regurgitation signal is inadequate for assessing PA pressure. FINDINGS  Left Ventricle: Left ventricular ejection fraction, by estimation, is 60 to 65%. The left ventricle has normal function. The left ventricle has no regional wall motion abnormalities. The left ventricular internal cavity size was normal in size. There is  mild left ventricular hypertrophy. Left ventricular diastolic parameters were normal. Right Ventricle: The right ventricular size is normal. No increase in right ventricular wall thickness. Right ventricular systolic function is normal. Tricuspid regurgitation signal is inadequate for assessing PA pressure. Left Atrium: Left atrial size was normal in size. Right  Atrium: Right atrial size was normal in size. Pericardium: There is no evidence of pericardial effusion. Mitral Valve: The mitral valve is normal in structure. Normal mobility of the mitral valve leaflets. Trivial mitral valve regurgitation. No evidence of mitral valve stenosis. Tricuspid Valve: The tricuspid valve is normal in structure. Tricuspid valve regurgitation is not demonstrated. No evidence of tricuspid stenosis. Aortic Valve: The aortic valve is normal in structure. Aortic valve regurgitation is not visualized. No aortic stenosis is present. Aortic valve mean gradient measures 7.0 mmHg. Aortic valve peak gradient measures 13.5 mmHg. Aortic valve area, by VTI measures 1.48 cm. Pulmonic Valve: The pulmonic valve was normal in structure. Pulmonic valve regurgitation is not visualized. No evidence of pulmonic stenosis. Aorta: The aortic root is normal in size and structure. Venous: The inferior vena cava is normal in size with greater than 50% respiratory variability, suggesting right atrial pressure of 3  mmHg. IAS/Shunts: No atrial level shunt detected by color flow Doppler.  LEFT VENTRICLE PLAX 2D LVIDd:         4.11 cm  Diastology LVIDs:         2.51 cm  LV e' lateral:   9.25 cm/s LV PW:         1.35 cm  LV E/e' lateral: 11.8 LV IVS:        1.17 cm  LV e' medial:    7.51 cm/s LVOT diam:     1.90 cm  LV E/e' medial:  14.5 LV SV:         65 LV SV Index:   37 LVOT Area:     2.84 cm  RIGHT VENTRICLE RV Basal diam:  2.39 cm RV S prime:     19.00 cm/s TAPSE (M-mode): 2.1 cm LEFT ATRIUM             Index       RIGHT ATRIUM           Index LA diam:        3.30 cm 1.88 cm/m  RA Area:     10.60 cm LA Vol (A2C):   41.0 ml 23.31 ml/m RA Volume:   22.90 ml  13.02 ml/m LA Vol (A4C):   40.2 ml 22.85 ml/m LA Biplane Vol: 42.6 ml 24.22 ml/m  AORTIC VALVE                    PULMONIC VALVE AV Area (Vmax):    1.59 cm     PV Vmax:       0.84 m/s AV Area (Vmean):   1.59 cm     PV Peak grad:  2.8 mmHg AV Area (VTI):      1.48 cm AV Vmax:           184.00 cm/s AV Vmean:          117.000 cm/s AV VTI:            0.441 m AV Peak Grad:      13.5 mmHg AV Mean Grad:      7.0 mmHg LVOT Vmax:         103.00 cm/s LVOT Vmean:        65.500 cm/s LVOT VTI:          0.230 m LVOT/AV VTI ratio: 0.52  AORTA Ao Root diam: 2.60 cm MITRAL VALVE MV Area (PHT): 3.12 cm     SHUNTS MV Decel Time: 243 msec     Systemic VTI:  0.23 m MV E velocity: 109.00 cm/s  Systemic Diam: 1.90 cm MV A velocity: 85.90 cm/s MV E/A ratio:  1.27 Ida Rogue MD Electronically signed by Ida Rogue MD Signature Date/Time: 09/04/2019/1:50:56 PM    Final    MM CLIP PLACEMENT RIGHT  Result Date: 07/05/2019 CLINICAL DATA:  Ultrasound-guided core needle biopsies were performed of a suspicious palpable mass upper-outer quadrant right breast and an axillary lymph node with focal cortical thickening. EXAM: DIAGNOSTIC RIGHT MAMMOGRAM POST ULTRASOUND BIOPSIES COMPARISON:  Previous exam(s). FINDINGS: Mammographic images were obtained following ultrasound guided biopsy of a right breast mass in the upper-outer quadrant and ultrasound-guided biopsy of a right axillary lymph node. The Venus biopsy marking clip is in expected position at the site of the biopsied breast mass. HydroMARK biopsy clip is in the expected location of the biopsied lymph node. There are post biopsy changes surrounding the axillary biopsy clip. IMPRESSION: Appropriate positioning  of the Venus and [facility] biopsy marking clips in the right breast and right axilla. Final Assessment: Post Procedure Mammograms for Marker Placement Electronically Signed   By: Curlene Dolphin M.D.   On: 07/05/2019 09:19   [place] RT BREAST BX W LOC DEV 1ST LESION IMG BX SPEC US GUIDE  Addendum Date: 07/06/2019   ADDENDUM REPORT: 07/06/2019 11:53 ADDENDUM: Pathology revealed GRADE III INVASIVE MAMMARY CARCINOMA, NO SPECIAL TYPE of the RIGHT breast at 10-11 o'clock, 10 cm from nipple. This was found to be concordant by Dr. Curlene Dolphin.  Pathology revealed PREDOMINANTLY BLOOD AND FIBROADIPOSE TISSUE, WITH SCANT LYMPHOID TISSUE PRESENT of the RIGHT axillary lymph node. This was found to be concordant by Dr. Curlene Dolphin. Correlate with sentinel lymph node biopsy. Pathology results were discussed with the patient by telephone. The patient reported doing well after the biopsy with tenderness at the site. Post biopsy instructions and care were reviewed and questions were answered. The patient was encouraged to call [facility] [place] [facility] for any additional concerns. Surgical and Oncologist referral will be arranged by Al Pimple RN and Tanya Nones RN of [place]. Pathology results reported by Stacie Acres RN on 07/06/2019. Electronically Signed   By: Curlene Dolphin M.D.   On: 07/06/2019 11:53   Result Date: 07/06/2019 CLINICAL DATA:  Ultrasound-guided core needle biopsy was recommended of a suspicious palpable mass in the upper-outer quadrant of the right breast. EXAM: ULTRASOUND GUIDED RIGHT BREAST CORE NEEDLE BIOPSY COMPARISON:  Previous exam(s). FINDINGS: I met with the patient and we discussed the procedure of ultrasound-guided biopsy, including benefits and alternatives. We discussed the high likelihood of a successful procedure. We discussed the risks of the procedure, including infection, bleeding, tissue injury, clip migration, and inadequate sampling. Informed written consent was given. The usual time-out protocol was performed immediately prior to the procedure. Lesion quadrant: Upper outer quadrant Using sterile technique and 1% Lidocaine as local anesthetic, under direct ultrasound visualization, a 12 gauge spring-loaded device was used to perform biopsy of a 2.8 cm palpable mass in the 10-11 o'clock axis of the right breast approximately 10 cm from the nipple using a lateral approach. At the conclusion of the procedure Venus tissue marker clip was deployed into the biopsy  cavity. Follow up 2 view mammogram was performed and dictated separately. IMPRESSION: Ultrasound guided biopsy of the right breast. No apparent complications. Electronically Signed: By: Curlene Dolphin M.D. On: 07/05/2019 09:15   [place] RT BREAST BX W LOC DEV EA ADD LESION IMG BX SPEC US GUIDE  Addendum Date: 07/06/2019   ADDENDUM REPORT: 07/06/2019 11:56 ADDENDUM: Pathology revealed GRADE III INVASIVE MAMMARY CARCINOMA, NO SPECIAL TYPE of the RIGHT breast at 10-11 o'clock, 10 cm from nipple. This was found to be concordant by Dr. Curlene Dolphin. Pathology revealed PREDOMINANTLY BLOOD AND FIBROADIPOSE TISSUE, WITH SCANT LYMPHOID TISSUE PRESENT of the RIGHT axillary lymph node. This was found to be concordant by Dr. Curlene Dolphin. Correlate with sentinel lymph node biopsy. Pathology results were discussed with the patient by telephone. The patient reported doing well after the biopsy with tenderness at the site. Post biopsy instructions and care were reviewed and questions were answered. The patient was encouraged to call [facility] for any additional concerns. Surgical and Oncologist referral will be arranged by Al Pimple RN and Tanya Nones RN of [place]. Pathology results reported by Stacie Acres RN on 07/06/2019.  Electronically Signed   By: Curlene Dolphin M.D.   On: 07/06/2019 11:56   Result Date: 07/06/2019 CLINICAL DATA:  Ultrasound-guided core needle biopsy was recommended of a right axillary lymph node with cortical thickening. EXAM: ULTRASOUND GUIDED RIGHT AXILLA CORE NEEDLE BIOPSY COMPARISON:  Previous exam(s). FINDINGS: I met with the patient and we discussed the procedure of ultrasound-guided biopsy, including benefits and alternatives. We discussed the high likelihood of a successful procedure. We discussed the risks of the procedure, including infection, bleeding, tissue injury, clip migration, and inadequate sampling. Informed  written consent was given. The usual time-out protocol was performed immediately prior to the procedure. Using sterile technique and 1% Lidocaine as local anesthetic, under direct ultrasound visualization, a 14 gauge spring-loaded device was used to perform biopsy of an axillary lymph node with focal cortical thickening using a lateral approach. At the conclusion of the procedure [facility] tissue marker clip was deployed into the biopsy cavity. Follow up 2 view mammogram was performed and dictated separately. IMPRESSION: Ultrasound guided biopsy of a right axillary lymph node. No apparent complications. Electronically Signed: By: Curlene Dolphin M.D. On: 07/05/2019 09:16   IR IMAGING GUIDED PORT INSERTION  Result Date: 07/20/2019 CLINICAL DATA:  Right breast carcinoma and need for porta cath for chemotherapy. EXAM: IMPLANTED PORT A CATH PLACEMENT WITH ULTRASOUND AND FLUOROSCOPIC GUIDANCE ANESTHESIA/SEDATION: 1.0 mg IV Versed; 50 mcg IV Fentanyl Total Moderate Sedation Time:  28 minutes The patient's level of consciousness and physiologic status were continuously monitored during the procedure by Radiology nursing. Additional Medications: 2 g IV Ancef. FLUOROSCOPY TIME:  30 seconds. PROCEDURE: The procedure, risks, benefits, and alternatives were explained to the patient. Questions regarding the procedure were encouraged and answered. The patient understands and consents to the procedure. A time-out was performed prior to initiating the procedure. Ultrasound was utilized to confirm patency of the left internal jugular vein. The left neck and chest were prepped with chlorhexidine in a sterile fashion, and a sterile drape was applied covering the operative field. Maximum barrier sterile technique with sterile gowns and gloves were used for the procedure. Local anesthesia was provided with 1% lidocaine. After creating a small venotomy incision, a 21 gauge needle was advanced into the left internal jugular vein under  direct, real-time ultrasound guidance. Ultrasound image documentation was performed. After securing guidewire access, an 8 Fr dilator was placed. A J-wire was kinked to measure appropriate catheter length. A subcutaneous port pocket was then created along the upper chest wall utilizing sharp and blunt dissection. Portable cautery was utilized. The pocket was irrigated with sterile saline. A single lumen power injectable port was chosen for placement. The 8 Fr catheter was tunneled from the port pocket site to the venotomy incision. The port was placed in the pocket. External catheter was trimmed to appropriate length based on guidewire measurement. At the venotomy, an 8 Fr peel-away sheath was placed over a guidewire. The catheter was then placed through the sheath and the sheath removed. Final catheter positioning was confirmed and documented with a fluoroscopic spot image. The port was accessed with a needle and aspirated and flushed with heparinized saline. The access needle was removed. The venotomy and port pocket incisions were closed with subcutaneous 3-0 Monocryl and subcuticular 4-0 Vicryl. Dermabond was applied to both incisions. COMPLICATIONS: COMPLICATIONS None FINDINGS: After catheter placement, the tip lies at the cavo-atrial junction. The catheter aspirates normally and is ready for immediate use. IMPRESSION: Placement of single lumen port a cath via left internal jugular  vein. The catheter tip lies at the cavo-atrial junction. A power injectable port a cath was placed and is ready for immediate use. Electronically Signed   By: Aletta Edouard M.D.   On: 07/20/2019 16:28      ASSESSMENT & PLAN:  1. Malignant neoplasm of upper-outer quadrant of right female breast, unspecified estrogen receptor status ([place])   2. Syncope, unspecified syncope type   3. Encounter for antineoplastic chemotherapy    cT2N0 grade 3 invasive mammary carcinoma.  ER negative, PR weakly positive (<=10%), HER-2  negative Status post 3 cycles of ddAC with growth factor support. Labs reviewed and discussed with patient. Cardiology note was reviewed. Proceed with cycle 4 ddAC.  Patient received growth factor support on day 2.  #Syncope, follow-up with cardiology.  Patient will get cardiac monitor for 2 weeks for evaluation of any significant arrhythmia.  Plan repeat unilateral right diagnostic mammogram and ultrasound for interval evaluation of chemotherapy response.  If stable disease or treatment response, will proceed with weekly Taxol for 12 weeks.  Given her negative right axillary lymph node status, performance status and age, I will hold off carboplatin.  All questions were answered. The patient knows to call the clinic with any problems questions or concerns.   Return of visit: 2 weeks Earlie Server, MD, PhD Hematology Oncology [facility] Pager- [phone] 09/22/2019

## 2019-09-23 ENCOUNTER — Telehealth: Payer: Self-pay

## 2019-09-23 NOTE — Telephone Encounter (Signed)
Lmom to confirm and screen for 09-27-19 ov.

## 2019-09-26 ENCOUNTER — Other Ambulatory Visit: Payer: Self-pay

## 2019-09-26 MED ORDER — TRUEPLUS LANCETS 30G MISC: 3 refills | Status: DC

## 2019-09-27 ENCOUNTER — Ambulatory Visit (INDEPENDENT_AMBULATORY_CARE_PROVIDER_SITE_OTHER): Payer: Medicare HMO | Admitting: Internal Medicine

## 2019-09-27 ENCOUNTER — Telehealth: Payer: Self-pay

## 2019-09-27 ENCOUNTER — Ambulatory Visit: Payer: Medicare HMO | Admitting: Internal Medicine

## 2019-09-27 ENCOUNTER — Other Ambulatory Visit: Payer: Self-pay

## 2019-09-27 ENCOUNTER — Encounter: Payer: Self-pay | Admitting: Internal Medicine

## 2019-09-27 VITALS — BP 138/80 | HR 100 | Temp 96.9°F | Resp 16 | Ht 61.0 in | Wt 169.0 lb

## 2019-09-27 DIAGNOSIS — I10 Essential (primary) hypertension: Secondary | ICD-10-CM | POA: Diagnosis not present

## 2019-09-27 DIAGNOSIS — E1165 Type 2 diabetes mellitus with hyperglycemia: Secondary | ICD-10-CM | POA: Diagnosis not present

## 2019-09-27 DIAGNOSIS — Z171 Estrogen receptor negative status [ER-]: Secondary | ICD-10-CM | POA: Diagnosis not present

## 2019-09-27 DIAGNOSIS — C50411 Malignant neoplasm of upper-outer quadrant of right female breast: Secondary | ICD-10-CM | POA: Diagnosis not present

## 2019-09-27 NOTE — Telephone Encounter (Signed)
Confirmed appointment on 09/29/2019. klh

## 2019-09-27 NOTE — Progress Notes (Signed)
[facility] [place], [place] [address]  Internal MEDICINE  Office Visit Note  Patient Name: Dawn Alvarez  [id number]  [id number]  Date of Service: 09/27/2019  Chief Complaint  Patient presents with  . Diabetes    glucose 117 in the morning   . Hyperlipidemia  . Hypertension    HPI  Pt is here for follow up on recent start of her medications. She was only started on 2 blood pressure meds since she has not been taking them for a while. Insulin dose was increased to 44 units as well. Her blood sugar was elevated due to steroids in her chemo She is feeling well. Getting treatment for breast cancer from oncology.   Current Medication: Outpatient Encounter Medications as of 09/27/2019  Medication Sig Note  . Alcohol Swabs (B-D SINGLE USE SWABS REGULAR) PADS Use as directed twice a day   . amLODipine (NORVASC) 5 MG tablet Take one tab po qd for htn   . Blood Glucose Monitoring Suppl (TRUE METRIX AIR GLUCOSE METER) w/Device KIT 1 Device by Does not apply route in the morning and at bedtime. Use ad directed e11.65   . brimonidine (ALPHAGAN) 0.2 % ophthalmic solution Place 1 drop into both eyes 2 (two) times daily. 09/03/2019: Has not started this med yet.  Marland Kitchen dexamethasone (DECADRON) 4 MG tablet Take 4 mg by mouth daily.    Marland Kitchen glucose blood (TRUE METRIX BLOOD GLUCOSE TEST) test strip Use as instructed twice a daily diag e11.65   . insulin detemir (LEVEMIR FLEXTOUCH) 100 UNIT/ML FlexPen Inject 44 Units into the skin daily.   . Insulin Pen Needle (PEN NEEDLES) 31G X 8 MM MISC Use as directed with insulin E11.65   . Lancets Misc. (ACCU-CHEK MULTICLIX LANCET DEV) KIT by Does not apply route. Use as directed twice a day diag E11.65   . lidocaine-prilocaine (EMLA) cream    . lisinopril (ZESTRIL) 20 MG tablet Take one tab po qd for HTN   . Omega-3 Fatty Acids (FISH OIL) 1000 MG CAPS Take 1,000 mg by mouth daily.    . prochlorperazine (COMPAZINE) 10 MG tablet Take 1 tablet  (10 mg total) by mouth every 6 (six) hours as needed for nausea or vomiting.   . Semaglutide, 1 MG/DOSE, (OZEMPIC, 1 MG/DOSE,) 2 MG/1.5ML SOPN Inject 1 mg into the skin once a week.   Marland Kitchen SIMBRINZA 1-0.2 % SUSP    . TRUEplus Lancets 30G MISC Use as directed twice daily diag e11.65    No facility-administered encounter medications on file as of 09/27/2019.    Surgical History: Past Surgical History:  Procedure Laterality Date  . BREAST BIOPSY Right 07/05/2019   [place] bx venus marker, path pending  . BREAST BIOPSY Right 07/05/2019   LN bx, hydromarker, path pending  . CESAREAN SECTION    . COLONOSCOPY WITH PROPOFOL N/A 09/11/2015   Procedure: COLONOSCOPY WITH PROPOFOL;  Surgeon: Lucilla Lame, MD;  Location: [facility] ENDOSCOPY;  Service: Endoscopy;  Laterality: N/A;  . IR IMAGING GUIDED PORT INSERTION  07/20/2019    Medical History: Past Medical History:  Diagnosis Date  . Asthma   . Cancer ([place])    breast  . COPD exacerbation ([place]) 04/12/2016  . Diabetes mellitus without complication ([place])   . Hyperlipemia   . Hypertension     Family History: Family History  Problem Relation Age of Onset  . Diabetes Mother   . Hypertension Mother   . Hypertension Father   . Diabetes Father  Social History   Socioeconomic History  . Marital status: Married    Spouse name: Not on file  . Number of children: Not on file  . Years of education: Not on file  . Highest education level: Not on file  Occupational History  . Not on file  Tobacco Use  . Smoking status: Former Smoker    Packs/day: 1.00    Years: 2.00    Pack years: 2.00    Types: Cigarettes    Quit date: 05/19/2019    Years since quitting: 0.3  . Smokeless tobacco: Never Used  Substance and Sexual Activity  . Alcohol use: Yes    Alcohol/week: 2.0 standard drinks    Types: 2 Cans of beer per week    Comment: ocassionally   . Drug use: No  . Sexual activity: Never    Birth control/protection: Abstinence  Other Topics  Concern  . Not on file  Social History Narrative  . Not on file   Social Determinants of Health   Financial Resource Strain:   . Difficulty of Paying Living Expenses:   Food Insecurity:   . Worried About Charity fundraiser in the Last Year:   . Arboriculturist in the Last Year:   Transportation Needs:   . Film/video editor (Medical):   Marland Kitchen Lack of Transportation (Non-Medical):   Physical Activity:   . Days of Exercise per Week:   . Minutes of Exercise per Session:   Stress:   . Feeling of Stress :   Social Connections:   . Frequency of Communication with Friends and Family:   . Frequency of Social Gatherings with Friends and Family:   . Attends Religious Services:   . Active Member of Clubs or Organizations:   . Attends Archivist Meetings:   Marland Kitchen Marital Status:   Intimate Partner Violence:   . Fear of Current or Ex-Partner:   . Emotionally Abused:   Marland Kitchen Physically Abused:   . Sexually Abused:       Review of Systems  Constitutional: Positive for fatigue.  Respiratory: Negative.   Cardiovascular: Negative.   Gastrointestinal: Negative.   Neurological: Negative.   Hematological: Negative.     Vital Signs: BP 138/80   Pulse 100   Temp (!) 96.9 F (36.1 C)   Resp 16   Ht _0  (1.549 m)   Wt 169 lb (76.7 kg)   SpO2 99%   BMI 31.93 kg/m    Physical Exam Cardiovascular:     Rate and Rhythm: Normal rate.     Heart sounds: Normal heart sounds.  Pulmonary:     Effort: Pulmonary effort is normal.  Neurological:     Mental Status: She is alert.    Assessment/Plan: 1. Uncontrolled type 2 diabetes mellitus with hyperglycemia ([place]) - well controlled. Continue  Levemir and Ozempic as before   2. Essential hypertension - Well controlled on Amlodipine and Lisinopril   3. Malignant neoplasm of upper-outer quadrant of right breast in female, estrogen receptor negative ([place]) - Per oncology   General Counseling: Dawn Alvarez verbalizes understanding of  the findings of todays visit and agrees with plan of treatment. I have discussed any further diagnostic evaluation that may be needed or ordered today. We also reviewed her medications today. she has been encouraged to call the office with any questions or concerns that should arise related to todays visit.  Total time spent:25Minutes Time spent includes review of chart, medications, test results, and follow  up plan with the patient.    Dr Lavera Guise Internal medicine

## 2019-09-28 ENCOUNTER — Ambulatory Visit: Payer: Medicare HMO

## 2019-09-28 ENCOUNTER — Ambulatory Visit: Payer: Medicare HMO | Admitting: Oncology

## 2019-09-28 ENCOUNTER — Other Ambulatory Visit: Payer: Medicare HMO

## 2019-09-29 ENCOUNTER — Other Ambulatory Visit: Payer: Self-pay

## 2019-09-29 ENCOUNTER — Encounter: Payer: Self-pay | Admitting: Nurse Practitioner

## 2019-09-29 ENCOUNTER — Ambulatory Visit (INDEPENDENT_AMBULATORY_CARE_PROVIDER_SITE_OTHER): Payer: Medicare HMO | Admitting: Nurse Practitioner

## 2019-09-29 VITALS — BP 110/72 | HR 101 | Temp 96.0°F | Resp 16 | Ht 61.0 in | Wt 167.0 lb

## 2019-09-29 DIAGNOSIS — E1165 Type 2 diabetes mellitus with hyperglycemia: Secondary | ICD-10-CM | POA: Diagnosis not present

## 2019-09-29 DIAGNOSIS — I10 Essential (primary) hypertension: Secondary | ICD-10-CM | POA: Diagnosis not present

## 2019-09-29 DIAGNOSIS — R300 Dysuria: Secondary | ICD-10-CM

## 2019-09-29 DIAGNOSIS — C50411 Malignant neoplasm of upper-outer quadrant of right female breast: Secondary | ICD-10-CM | POA: Diagnosis not present

## 2019-09-29 DIAGNOSIS — Z0001 Encounter for general adult medical examination with abnormal findings: Secondary | ICD-10-CM

## 2019-09-29 DIAGNOSIS — Z171 Estrogen receptor negative status [ER-]: Secondary | ICD-10-CM

## 2019-09-29 MED ORDER — TRUE METRIX BLOOD GLUCOSE TEST VI STRP: ORAL_STRIP | 3 refills | Status: DC

## 2019-09-29 MED ORDER — TRUEPLUS LANCETS 30G MISC: 3 refills | Status: DC

## 2019-09-29 NOTE — Progress Notes (Signed)

## 2019-09-29 NOTE — Progress Notes (Signed)
[facility] [place], [place] [address]  Internal MEDICINE  Office Visit Note  Patient Name: Dawn Alvarez  [id number]  [id number]  Date of Service: 10/12/2019  Chief Complaint  Patient presents with  . Medicare Wellness  . Diabetes  . Hyperlipidemia  . Hypertension  . Asthma  . COPD  . Medication Refill    lancets and strips      The patient is here for health maintenance exam. She is now seeing a cardiologist. Her blood pressure is well managed. Blood sugars are doing better. She states that she is doing well, overall. She has recently been diagnosed with breast cancer. She is being followed per oncology and surgery. Has tolerated all chemotherapy and radiation treatments well. She is due to have a check of routine, fasting labs.   Pt is here for routine health maintenance examination  Current Medication: Outpatient Encounter Medications as of 09/29/2019  Medication Sig Note  . Alcohol Swabs (B-D SINGLE USE SWABS REGULAR) PADS Use as directed twice a day   . amLODipine (NORVASC) 5 MG tablet Take one tab po qd for htn   . Blood Glucose Monitoring Suppl (TRUE METRIX AIR GLUCOSE METER) w/Device KIT 1 Device by Does not apply route in the morning and at bedtime. Use ad directed e11.65   . brimonidine (ALPHAGAN) 0.2 % ophthalmic solution Place 1 drop into both eyes 2 (two) times daily. 09/03/2019: Has not started this med yet.  . insulin detemir (LEVEMIR FLEXTOUCH) 100 UNIT/ML FlexPen Inject 44 Units into the skin daily.   . Insulin Pen Needle (PEN NEEDLES) 31G X 8 MM MISC Use as directed with insulin E11.65   . Lancets Misc. (ACCU-CHEK MULTICLIX LANCET DEV) KIT by Does not apply route. Use as directed twice a day diag E11.65   . lidocaine-prilocaine (EMLA) cream    . lisinopril (ZESTRIL) 20 MG tablet Take one tab po qd for HTN (Patient not taking: Reported on 10/11/2019)   . Omega-3 Fatty Acids (FISH OIL) 1000 MG CAPS Take 1,000 mg by mouth daily.    .  prochlorperazine (COMPAZINE) 10 MG tablet Take 1 tablet (10 mg total) by mouth every 6 (six) hours as needed for nausea or vomiting.   . Semaglutide, 1 MG/DOSE, (OZEMPIC, 1 MG/DOSE,) 2 MG/1.5ML SOPN Inject 1 mg into the skin once a week.   Marland Kitchen SIMBRINZA 1-0.2 % SUSP    . TRUEplus Lancets 30G MISC Use as directed twice daily diag e11.65   . [DISCONTINUED] dexamethasone (DECADRON) 4 MG tablet Take 4 mg by mouth daily.    . [DISCONTINUED] glucose blood (TRUE METRIX BLOOD GLUCOSE TEST) test strip Use as instructed twice a daily diag e11.65   . [DISCONTINUED] glucose blood (TRUE METRIX BLOOD GLUCOSE TEST) test strip Use as instructed twice a daily diag e11.65   . [DISCONTINUED] TRUEplus Lancets 30G MISC Use as directed twice daily diag e11.65    No facility-administered encounter medications on file as of 09/29/2019.    Surgical History: Past Surgical History:  Procedure Laterality Date  . BREAST BIOPSY Right 07/05/2019   [place] bx venus marker, grade 3 invasive mammary carcinoma  . BREAST BIOPSY Right 07/05/2019   LN bx, hydromarker,PREDOMINANTLY BLOOD AND FIBROADIPOSE TISSUE, WITH SCANT LYMPHOID TISSUE PRESENT      . CESAREAN SECTION    . COLONOSCOPY WITH PROPOFOL N/A 09/11/2015   Procedure: COLONOSCOPY WITH PROPOFOL;  Surgeon: Lucilla Lame, MD;  Location: [facility] ENDOSCOPY;  Service: Endoscopy;  Laterality: N/A;  .  IR IMAGING GUIDED PORT INSERTION  07/20/2019    Medical History: Past Medical History:  Diagnosis Date  . Asthma   . Cancer ([place])    breast  . COPD exacerbation ([place]) 04/12/2016  . Diabetes mellitus without complication ([place])   . Hyperlipemia   . Hypertension   . Personal history of chemotherapy     Family History: Family History  Problem Relation Age of Onset  . Diabetes Mother   . Hypertension Mother   . Hypertension Father   . Diabetes Father       Review of Systems  Constitutional: Positive for fatigue. Negative for activity change, chills and unexpected weight  change.  HENT: Negative for congestion, postnasal drip, rhinorrhea, sneezing and sore throat.   Respiratory: Negative for cough, chest tightness and shortness of breath.   Cardiovascular: Negative for chest pain and palpitations.  Gastrointestinal: Negative for abdominal pain, constipation, diarrhea, nausea and vomiting.  Endocrine: Negative for cold intolerance, heat intolerance, polydipsia and polyuria.       Blood sugars are gradually improving. She needs to have new prescription for lancets and glucose strips.   Genitourinary: Negative for dysuria, frequency and urgency.  Musculoskeletal: Negative for arthralgias, back pain, joint swelling and neck pain.  Skin: Negative for rash.  Allergic/Immunologic: Negative for environmental allergies.  Neurological: Negative for dizziness, tremors, numbness and headaches.  Hematological: Negative for adenopathy. Does not bruise/bleed easily.  Psychiatric/Behavioral: Negative for behavioral problems (Depression), sleep disturbance and suicidal ideas. The patient is not nervous/anxious.      Today's Vitals   09/29/19 1103  BP: 110/72  Pulse: (!) 101  Resp: 16  Temp: (!) 96 F (35.6 C)  SpO2: 99%  Weight: 167 lb (75.8 kg)  Height: '5\' 1"'  (1.549 m)   Body mass index is 31.55 kg/m.  Physical Exam Vitals and nursing note reviewed.  Constitutional:      General: She is not in acute distress.    Appearance: Normal appearance. She is well-developed. She is not diaphoretic.  HENT:     Head: Normocephalic and atraumatic.     Nose: Nose normal.     Mouth/Throat:     Pharynx: No oropharyngeal exudate.  Eyes:     Conjunctiva/sclera: Conjunctivae normal.     Pupils: Pupils are equal, round, and reactive to light.  Neck:     Thyroid: No thyromegaly.     Vascular: No JVD.     Trachea: No tracheal deviation.  Cardiovascular:     Rate and Rhythm: Normal rate and regular rhythm.     Pulses: Normal pulses.          Dorsalis pedis pulses are 2+  on the right side and 2+ on the left side.       Posterior tibial pulses are 2+ on the right side and 2+ on the left side.     Heart sounds: Normal heart sounds. No murmur. No friction rub. No gallop.   Pulmonary:     Effort: Pulmonary effort is normal. No respiratory distress.     Breath sounds: Normal breath sounds. No wheezing or rales.  Chest:     Chest wall: No tenderness.  Abdominal:     General: Bowel sounds are normal.     Palpations: Abdomen is soft.     Tenderness: There is no abdominal tenderness.  Musculoskeletal:        General: Normal range of motion.     Cervical back: Normal range of motion and neck supple.  Right foot: Normal range of motion. No deformity or bunion.     Left foot: Normal range of motion. No deformity or bunion.  Feet:     Right foot:     Protective Sensation: 10 sites tested. 10 sites sensed.     Skin integrity: Skin integrity normal. No ulcer, blister, skin breakdown or erythema.     Left foot:     Protective Sensation: 10 sites tested. 10 sites sensed.     Skin integrity: Skin integrity normal. No ulcer, blister, skin breakdown or erythema.  Lymphadenopathy:     Cervical: No cervical adenopathy.  Skin:    General: Skin is warm and dry.  Neurological:     Mental Status: She is alert and oriented to person, place, and time. Mental status is at baseline.     Cranial Nerves: No cranial nerve deficit.  Psychiatric:        Mood and Affect: Mood normal.        Behavior: Behavior normal.        Thought Content: Thought content normal.        Judgment: Judgment normal.    Depression screen [facility] 2/9 09/29/2019 01/27/2019 12/29/2018 09/27/2018 07/15/2018  Decreased Interest 0 0 0 0 0  Down, Depressed, Hopeless 0 0 0 0 0  PHQ - 2 Score 0 0 0 0 0    Functional Status Survey: Is the patient deaf or have difficulty hearing?: Yes(left ear) Does the patient have difficulty seeing, even when wearing glasses/contacts?: No Does the patient have difficulty  concentrating, remembering, or making decisions?: Yes(sometimes) Does the patient have difficulty walking or climbing stairs?: No Does the patient have difficulty dressing or bathing?: No Does the patient have difficulty doing errands alone such as visiting a doctor's office or shopping?: No  MMSE - Mini Mental State Exam 09/29/2019 09/27/2018 09/22/2017  Orientation to time '5 5 5  ' Orientation to Place '5 5 5  ' Registration '3 3 3  ' Attention/ Calculation '5 5 5  ' Recall '3 3 3  ' Language- name 2 objects '2 2 2  ' Language- repeat '1 1 1  ' Language- follow 3 step command '3 3 3  ' Language- read & follow direction '1 1 1  ' Write a sentence '1 1 1  ' Copy design '1 1 1  ' Total score '30 30 30    ' Fall Risk  09/29/2019 01/27/2019 12/29/2018 09/27/2018 07/15/2018  Falls in the past year? 0 0 1 0 0  Number falls in past yr: - 0 1 - -  Injury with Fall? - 0 0 - -      LABS: Recent Results (from the past 2160 hour(s))  CBC     Status: None   Collection Time: 07/20/19  1:52 PM  Result Value Ref Range   WBC 8.0 4.0 - 10.5 K/uL   RBC 4.39 3.87 - 5.11 MIL/uL   Hemoglobin 12.3 12.0 - 15.0 g/dL   HCT 37.2 36.0 - 46.0 %   MCV 84.7 80.0 - 100.0 fL   MCH 28.0 26.0 - 34.0 pg   MCHC 33.1 30.0 - 36.0 g/dL   RDW 13.7 11.5 - 15.5 %   Platelets 258 150 - 400 K/uL   nRBC 0.0 0.0 - 0.2 %    Comment: Performed at [facility], [address] [place], [place] [address]  Protime-INR     Status: None   Collection Time: 07/20/19  1:52 PM  Result Value Ref Range   Prothrombin Time 13.3 11.4 - 15.2  seconds   INR 1.0 0.8 - 1.2    Comment: (NOTE) INR goal varies based on device and disease states. Performed at [facility], [address] [place], [place] [address]   Glucose, capillary     Status: Abnormal   Collection Time: 07/20/19  2:22 PM  Result Value Ref Range   Glucose-Capillary 185 (H) 70 - 99 mg/dL  Comprehensive metabolic panel     Status: Abnormal   Collection Time: 07/27/19  8:43 AM   Result Value Ref Range   Sodium 137 135 - 145 mmol/L   Potassium 3.6 3.5 - 5.1 mmol/L   Chloride 103 98 - 111 mmol/L   CO2 23 22 - 32 mmol/L   Glucose, Bld 180 (H) 70 - 99 mg/dL    Comment: Glucose reference range applies only to samples taken after fasting for at least 8 hours.   BUN 13 8 - 23 mg/dL   Creatinine, Ser 0.80 0.44 - 1.00 mg/dL   Calcium 9.2 8.9 - 10.3 mg/dL   Total Protein 7.7 6.5 - 8.1 g/dL   Albumin 3.9 3.5 - 5.0 g/dL   AST 28 15 - 41 U/L   ALT 19 0 - 44 U/L   Alkaline Phosphatase 38 38 - 126 U/L   Total Bilirubin 0.5 0.3 - 1.2 mg/dL   GFR calc non Af Amer >60 >60 mL/min   GFR calc Af Amer >60 >60 mL/min   Anion gap 11 5 - 15    Comment: Performed at [facility], [address] [place], [place] [address]  CBC with Differential     Status: None   Collection Time: 07/27/19  8:43 AM  Result Value Ref Range   WBC 8.1 4.0 - 10.5 K/uL   RBC 4.52 3.87 - 5.11 MIL/uL   Hemoglobin 12.5 12.0 - 15.0 g/dL   HCT 38.4 36.0 - 46.0 %   MCV 85.0 80.0 - 100.0 fL   MCH 27.7 26.0 - 34.0 pg   MCHC 32.6 30.0 - 36.0 g/dL   RDW 13.3 11.5 - 15.5 %   Platelets 240 150 - 400 K/uL   nRBC 0.0 0.0 - 0.2 %   Neutrophils Relative % 54 %   Neutro Abs 4.3 1.7 - 7.7 K/uL   Lymphocytes Relative 34 %   Lymphs Abs 2.8 0.7 - 4.0 K/uL   Monocytes Relative 8 %   Monocytes Absolute 0.6 0.1 - 1.0 K/uL   Eosinophils Relative 3 %   Eosinophils Absolute 0.3 0.0 - 0.5 K/uL   Basophils Relative 1 %   Basophils Absolute 0.1 0.0 - 0.1 K/uL   Immature Granulocytes 0 %   Abs Immature Granulocytes 0.02 0.00 - 0.07 K/uL    Comment: Performed at [facility], [address] [place], [place] [address]  Comprehensive metabolic panel     Status: Abnormal   Collection Time: 07/30/19  2:09 PM  Result Value Ref Range   Sodium 137 135 - 145 mmol/L   Potassium 4.1 3.5 - 5.1 mmol/L   Chloride 103 98 - 111 mmol/L   CO2 26 22 - 32 mmol/L   Glucose, Bld 122 (H) 70 - 99 mg/dL    Comment: Glucose  reference range applies only to samples taken after fasting for at least 8 hours.   BUN 19 8 - 23 mg/dL   Creatinine, Ser 0.93 0.44 - 1.00 mg/dL   Calcium 10.2 8.9 - 10.3 mg/dL   Total Protein 8.3 (H) 6.5 - 8.1  g/dL   Albumin 4.2 3.5 - 5.0 g/dL   AST 19 15 - 41 U/L   ALT 15 0 - 44 U/L   Alkaline Phosphatase 38 38 - 126 U/L   Total Bilirubin 0.7 0.3 - 1.2 mg/dL   GFR calc non Af Amer >60 >60 mL/min   GFR calc Af Amer >60 >60 mL/min   Anion gap 8 5 - 15    Comment: Performed at [facility], [address] [place], [place] [address]  CBC     Status: None   Collection Time: 07/30/19  2:09 PM  Result Value Ref Range   WBC 7.9 4.0 - 10.5 K/uL   RBC 5.04 3.87 - 5.11 MIL/uL   Hemoglobin 14.0 12.0 - 15.0 g/dL   HCT 42.1 36.0 - 46.0 %   MCV 83.5 80.0 - 100.0 fL   MCH 27.8 26.0 - 34.0 pg   MCHC 33.3 30.0 - 36.0 g/dL   RDW 13.7 11.5 - 15.5 %   Platelets 292 150 - 400 K/uL   nRBC 0.0 0.0 - 0.2 %    Comment: Performed at [facility], [address] [place], [place] [address]  Glucose, capillary     Status: Abnormal   Collection Time: 07/30/19  2:14 PM  Result Value Ref Range   Glucose-Capillary 113 (H) 70 - 99 mg/dL    Comment: Glucose reference range applies only to samples taken after fasting for at least 8 hours.  Troponin I (High Sensitivity)     Status: None   Collection Time: 07/30/19  2:26 PM  Result Value Ref Range   Troponin I (High Sensitivity) 8 <18 ng/L    Comment: (NOTE) Elevated high sensitivity troponin I (hsTnI) values and significant  changes across serial measurements may suggest ACS but many other  chronic and acute conditions are known to elevate hsTnI results.  Refer to the "Links" section for chest pain algorithms and additional  guidance. Performed at [facility], [address] [place], [place] [address]   Urinalysis, Routine w reflex microscopic     Status: Abnormal   Collection Time: 07/30/19  4:35 PM  Result Value Ref  Range   Color, Urine YELLOW (A) YELLOW   APPearance CLEAR (A) CLEAR   Specific Gravity, Urine 1.008 1.005 - 1.030   pH 5.0 5.0 - 8.0   Glucose, UA NEGATIVE NEGATIVE mg/dL   Hgb urine dipstick NEGATIVE NEGATIVE   Bilirubin Urine NEGATIVE NEGATIVE   Ketones, ur NEGATIVE NEGATIVE mg/dL   Protein, ur NEGATIVE NEGATIVE mg/dL   Nitrite NEGATIVE NEGATIVE   Leukocytes,Ua NEGATIVE NEGATIVE    Comment: Performed at [facility], [address] [place], [place] [address]  Comprehensive metabolic panel     Status: Abnormal   Collection Time: 08/04/19  8:06 AM  Result Value Ref Range   Sodium 137 135 - 145 mmol/L   Potassium 3.9 3.5 - 5.1 mmol/L   Chloride 104 98 - 111 mmol/L   CO2 22 22 - 32 mmol/L   Glucose, Bld 167 (H) 70 - 99 mg/dL    Comment: Glucose reference range applies only to samples taken after fasting for at least 8 hours.   BUN 29 (H) 8 - 23 mg/dL   Creatinine, Ser 1.21 (H) 0.44 - 1.00 mg/dL   Calcium 9.6 8.9 - 10.3 mg/dL   Total Protein 7.9 6.5 - 8.1 g/dL   Albumin 4.0 3.5 - 5.0 g/dL   AST 18 15 - 41 U/L  ALT 14 0 - 44 U/L   Alkaline Phosphatase 35 (L) 38 - 126 U/L   Total Bilirubin 0.6 0.3 - 1.2 mg/dL   GFR calc non Af Amer 48 (L) >60 mL/min   GFR calc Af Amer 55 (L) >60 mL/min   Anion gap 11 5 - 15    Comment: Performed at [facility], [address] [place], [place] [address]  CBC with Differential     Status: None   Collection Time: 08/04/19  8:06 AM  Result Value Ref Range   WBC 9.0 4.0 - 10.5 K/uL   RBC 4.47 3.87 - 5.11 MIL/uL   Hemoglobin 12.4 12.0 - 15.0 g/dL   HCT 38.2 36.0 - 46.0 %   MCV 85.5 80.0 - 100.0 fL   MCH 27.7 26.0 - 34.0 pg   MCHC 32.5 30.0 - 36.0 g/dL   RDW 13.2 11.5 - 15.5 %   Platelets 291 150 - 400 K/uL   nRBC 0.0 0.0 - 0.2 %   Neutrophils Relative % 53 %   Neutro Abs 4.8 1.7 - 7.7 K/uL   Lymphocytes Relative 34 %   Lymphs Abs 3.0 0.7 - 4.0 K/uL   Monocytes Relative 8 %   Monocytes Absolute 0.7 0.1 - 1.0 K/uL    Eosinophils Relative 4 %   Eosinophils Absolute 0.3 0.0 - 0.5 K/uL   Basophils Relative 1 %   Basophils Absolute 0.1 0.0 - 0.1 K/uL   Immature Granulocytes 0 %   Abs Immature Granulocytes 0.02 0.00 - 0.07 K/uL    Comment: Performed at [facility], [address] [place], [place] [address]  Comprehensive metabolic panel     Status: Abnormal   Collection Time: 08/11/19 10:46 AM  Result Value Ref Range   Sodium 133 (L) 135 - 145 mmol/L   Potassium 3.7 3.5 - 5.1 mmol/L   Chloride 100 98 - 111 mmol/L   CO2 23 22 - 32 mmol/L   Glucose, Bld 244 (H) 70 - 99 mg/dL    Comment: Glucose reference range applies only to samples taken after fasting for at least 8 hours.   BUN 17 8 - 23 mg/dL   Creatinine, Ser 0.92 0.44 - 1.00 mg/dL   Calcium 9.0 8.9 - 10.3 mg/dL   Total Protein 7.0 6.5 - 8.1 g/dL   Albumin 3.6 3.5 - 5.0 g/dL   AST 18 15 - 41 U/L   ALT 18 0 - 44 U/L   Alkaline Phosphatase 56 38 - 126 U/L   Total Bilirubin 0.2 (L) 0.3 - 1.2 mg/dL   GFR calc non Af Amer >60 >60 mL/min   GFR calc Af Amer >60 >60 mL/min   Anion gap 10 5 - 15    Comment: Performed at [facility], [address] [place], [place] [address]  CBC with Differential     Status: Abnormal   Collection Time: 08/11/19 10:46 AM  Result Value Ref Range   WBC 3.2 (L) 4.0 - 10.5 K/uL   RBC 4.14 3.87 - 5.11 MIL/uL   Hemoglobin 11.4 (L) 12.0 - 15.0 g/dL   HCT 34.8 (L) 36.0 - 46.0 %   MCV 84.1 80.0 - 100.0 fL   MCH 27.5 26.0 - 34.0 pg   MCHC 32.8 30.0 - 36.0 g/dL   RDW 12.5 11.5 - 15.5 %   Platelets 173 150 - 400 K/uL   nRBC 0.0 0.0 - 0.2 %   Neutrophils Relative % 38 %  Neutro Abs 1.2 (L) 1.7 - 7.7 K/uL   Lymphocytes Relative 45 %   Lymphs Abs 1.4 0.7 - 4.0 K/uL   Monocytes Relative 8 %   Monocytes Absolute 0.2 0.1 - 1.0 K/uL   Eosinophils Relative 4 %   Eosinophils Absolute 0.1 0.0 - 0.5 K/uL   Basophils Relative 2 %   Basophils Absolute 0.1 0.0 - 0.1 K/uL   WBC Morphology      DIFF CONFIRMED BY  MANUAL. CONSISTANT WITH FULPHILA ADMIN 3.5.2021   RBC Morphology UNREMARKABLE    Smear Review Normal platelet morphology     Comment: PLATELETS APPEAR ADEQUATE   Immature Granulocytes 3 %    Comment: Increased IG's, likely caused by Bone Marrow Colony Stimulating Factor received within 30 days.   Abs Immature Granulocytes 0.08 (H) 0.00 - 0.07 K/uL    Comment: Performed at [facility], [address] [place], [place] [address]  Comprehensive metabolic panel     Status: Abnormal   Collection Time: 08/18/19  8:28 AM  Result Value Ref Range   Sodium 136 135 - 145 mmol/L   Potassium 3.7 3.5 - 5.1 mmol/L   Chloride 101 98 - 111 mmol/L   CO2 24 22 - 32 mmol/L   Glucose, Bld 181 (H) 70 - 99 mg/dL    Comment: Glucose reference range applies only to samples taken after fasting for at least 8 hours.   BUN 29 (H) 8 - 23 mg/dL   Creatinine, Ser 1.16 (H) 0.44 - 1.00 mg/dL   Calcium 9.4 8.9 - 10.3 mg/dL   Total Protein 7.4 6.5 - 8.1 g/dL   Albumin 4.1 3.5 - 5.0 g/dL   AST 16 15 - 41 U/L   ALT 15 0 - 44 U/L   Alkaline Phosphatase 43 38 - 126 U/L   Total Bilirubin 0.5 0.3 - 1.2 mg/dL   GFR calc non Af Amer 50 (L) >60 mL/min   GFR calc Af Amer 58 (L) >60 mL/min   Anion gap 11 5 - 15    Comment: Performed at [facility], [address] [place], [place] [address]  CBC with Differential     Status: Abnormal   Collection Time: 08/18/19  8:28 AM  Result Value Ref Range   WBC 20.6 (H) 4.0 - 10.5 K/uL   RBC 3.89 3.87 - 5.11 MIL/uL   Hemoglobin 11.1 (L) 12.0 - 15.0 g/dL   HCT 31.2 (L) 36.0 - 46.0 %   MCV 80.2 80.0 - 100.0 fL   MCH 28.5 26.0 - 34.0 pg   MCHC 35.6 30.0 - 36.0 g/dL   RDW 13.2 11.5 - 15.5 %   Platelets 199 150 - 400 K/uL   nRBC 0.0 0.0 - 0.2 %   Neutrophils Relative % 80 %   Neutro Abs 16.2 (H) 1.7 - 7.7 K/uL   Lymphocytes Relative 11 %   Lymphs Abs 2.3 0.7 - 4.0 K/uL   Monocytes Relative 7 %   Monocytes Absolute 1.5 (H) 0.1 - 1.0 K/uL   Eosinophils Relative 0 %    Eosinophils Absolute 0.0 0.0 - 0.5 K/uL   Basophils Relative 0 %   Basophils Absolute 0.1 0.0 - 0.1 K/uL   Immature Granulocytes 2 %   Abs Immature Granulocytes 0.49 (H) 0.00 - 0.07 K/uL    Comment: Performed at [facility], [address] [place], [place] [address]  Comprehensive metabolic panel     Status: Abnormal   Collection Time: 09/01/19  8:18 AM  Result Value Ref Range   Sodium 135 135 - 145 mmol/L   Potassium 3.8 3.5 - 5.1 mmol/L   Chloride 101 98 - 111 mmol/L   CO2 25 22 - 32 mmol/L   Glucose, Bld 272 (H) 70 - 99 mg/dL    Comment: Glucose reference range applies only to samples taken after fasting for at least 8 hours.   BUN 22 8 - 23 mg/dL   Creatinine, Ser 1.06 (H) 0.44 - 1.00 mg/dL   Calcium 9.1 8.9 - 10.3 mg/dL   Total Protein 6.7 6.5 - 8.1 g/dL   Albumin 3.6 3.5 - 5.0 g/dL   AST 15 15 - 41 U/L   ALT 14 0 - 44 U/L   Alkaline Phosphatase 64 38 - 126 U/L   Total Bilirubin 0.3 0.3 - 1.2 mg/dL   GFR calc non Af Amer 56 (L) >60 mL/min   GFR calc Af Amer >60 >60 mL/min   Anion gap 9 5 - 15    Comment: Performed at [facility], [address] [place], [place] [address]  CBC with Differential     Status: Abnormal   Collection Time: 09/01/19  8:18 AM  Result Value Ref Range   WBC 19.1 (H) 4.0 - 10.5 K/uL   RBC 3.63 (L) 3.87 - 5.11 MIL/uL   Hemoglobin 10.3 (L) 12.0 - 15.0 g/dL   HCT 29.8 (L) 36.0 - 46.0 %   MCV 82.1 80.0 - 100.0 fL   MCH 28.4 26.0 - 34.0 pg   MCHC 34.6 30.0 - 36.0 g/dL   RDW 13.7 11.5 - 15.5 %   Platelets 139 (L) 150 - 400 K/uL   nRBC 0.2 0.0 - 0.2 %   Neutrophils Relative % 64 %   Neutro Abs 12.2 (H) 1.7 - 7.7 K/uL   Lymphocytes Relative 17 %   Lymphs Abs 3.3 0.7 - 4.0 K/uL   Monocytes Relative 11 %   Monocytes Absolute 2.1 (H) 0.1 - 1.0 K/uL   Eosinophils Relative 0 %   Eosinophils Absolute 0.1 0.0 - 0.5 K/uL   Basophils Relative 1 %   Basophils Absolute 0.2 (H) 0.0 - 0.1 K/uL   WBC Morphology FULPHILA ADMIN ON 3.22.2021     Immature Granulocytes 7 %    Comment: Increased IG's, likely caused by Bone Marrow Colony Stimulating Factor received within 30 days.   Abs Immature Granulocytes 1.34 (H) 0.00 - 0.07 K/uL    Comment: Performed at [facility], [address] [place], [place] [address]  CBC with Differential     Status: Abnormal   Collection Time: 09/03/19  6:25 PM  Result Value Ref Range   WBC 68.9 (HH) 4.0 - 10.5 K/uL    Comment: WHITE COUNT CONFIRMED ON SMEAR THIS CRITICAL RESULT HAS VERIFIED AND BEEN CALLED TO ALEXI OLIVER BY SHEA REQUEJO CROFT ON 04 03 2021 AT 2000, AND HAS BEEN READ BACK.     RBC 3.40 (L) 3.87 - 5.11 MIL/uL   Hemoglobin 9.7 (L) 12.0 - 15.0 g/dL   HCT 28.2 (L) 36.0 - 46.0 %   MCV 82.9 80.0 - 100.0 fL   MCH 28.5 26.0 - 34.0 pg   MCHC 34.4 30.0 - 36.0 g/dL   RDW 14.6 11.5 - 15.5 %   Platelets 146 (L) 150 - 400 K/uL   nRBC 0.0 0.0 - 0.2 %   Neutrophils Relative % 84 %   Neutro Abs 58.0 (H) 1.7 - 7.7 K/uL  Lymphocytes Relative 4 %   Lymphs Abs 2.9 0.7 - 4.0 K/uL   Monocytes Relative 2 %   Monocytes Absolute 1.1 (H) 0.1 - 1.0 K/uL   Eosinophils Relative 0 %   Eosinophils Absolute 0.1 0.0 - 0.5 K/uL   Basophils Relative 0 %   Basophils Absolute 0.1 0.0 - 0.1 K/uL   WBC Morphology MORPHOLOGY UNREMARKABLE    RBC Morphology MORPHOLOGY UNREMARKABLE    Smear Review MORPHOLOGY UNREMARKABLE    Immature Granulocytes 10 %   Abs Immature Granulocytes 6.66 (H) 0.00 - 0.07 K/uL    Comment: Performed at [facility], [address] [place], [place] [address]  Comprehensive metabolic panel     Status: Abnormal   Collection Time: 09/03/19  6:25 PM  Result Value Ref Range   Sodium 140 135 - 145 mmol/L   Potassium 3.4 (L) 3.5 - 5.1 mmol/L   Chloride 107 98 - 111 mmol/L   CO2 22 22 - 32 mmol/L   Glucose, Bld 253 (H) 70 - 99 mg/dL    Comment: Glucose reference range applies only to samples taken after fasting for at least 8 hours.   BUN 27 (H) 8 - 23 mg/dL    Creatinine, Ser 1.01 (H) 0.44 - 1.00 mg/dL   Calcium 9.2 8.9 - 10.3 mg/dL   Total Protein 6.4 (L) 6.5 - 8.1 g/dL   Albumin 3.5 3.5 - 5.0 g/dL   AST 14 (L) 15 - 41 U/L   ALT 12 0 - 44 U/L   Alkaline Phosphatase 62 38 - 126 U/L   Total Bilirubin 0.7 0.3 - 1.2 mg/dL   GFR calc non Af Amer 59 (L) >60 mL/min   GFR calc Af Amer >60 >60 mL/min   Anion gap 11 5 - 15    Comment: Performed at [facility], [address], [place], [place] [address]  Magnesium     Status: None   Collection Time: 09/03/19  6:25 PM  Result Value Ref Range   Magnesium 1.8 1.7 - 2.4 mg/dL    Comment: Performed at [facility], [place], [place] [address]  Troponin I (High Sensitivity)     Status: Abnormal   Collection Time: 09/03/19  6:25 PM  Result Value Ref Range   Troponin I (High Sensitivity) 20 (H) <18 ng/L    Comment: (NOTE) Elevated high sensitivity troponin I (hsTnI) values and significant  changes across serial measurements may suggest ACS but many other  chronic and acute conditions are known to elevate hsTnI results.  Refer to the "Links" section for chest pain algorithms and additional  guidance. Performed at [facility], [address] [place], [place] [address]   Urinalysis, Complete w Microscopic     Status: Abnormal   Collection Time: 09/03/19  6:25 PM  Result Value Ref Range   Color, Urine YELLOW (A) YELLOW   APPearance HAZY (A) CLEAR   Specific Gravity, Urine 1.020 1.005 - 1.030   pH 5.0 5.0 - 8.0   Glucose, UA 50 (A) NEGATIVE mg/dL   Hgb urine dipstick NEGATIVE NEGATIVE   Bilirubin Urine NEGATIVE NEGATIVE   Ketones, ur NEGATIVE NEGATIVE mg/dL   Protein, ur NEGATIVE NEGATIVE mg/dL   Nitrite NEGATIVE NEGATIVE   Leukocytes,Ua NEGATIVE NEGATIVE   RBC / HPF 0-5 0 - 5 RBC/hpf   WBC, UA 6-10 0 - 5 WBC/hpf   Bacteria, UA RARE (A) NONE SEEN   Squamous Epithelial / LPF NONE SEEN 0 -  5   Mucus PRESENT     Comment: Performed at [facility], [address] [place], [place] [address]  TSH     Status: None   Collection Time: 09/03/19  6:25 PM  Result Value Ref Range   TSH 0.908 0.350 - 4.500 uIU/mL    Comment: Performed by a 3rd Generation assay with a functional sensitivity of <=0.01 uIU/mL. Performed at [facility], [place], [place] [address]   Troponin I (High Sensitivity)     Status: None   Collection Time: 09/03/19  6:25 PM  Result Value Ref Range   Troponin I (High Sensitivity) 14 <18 ng/L    Comment: (NOTE) Elevated high sensitivity troponin I (hsTnI) values and significant  changes across serial measurements may suggest ACS but many other  chronic and acute conditions are known to elevate hsTnI results.  Refer to the "Links" section for chest pain algorithms and additional  guidance. Performed at [facility], [place], [place] [address]   SARS CORONAVIRUS 2 (TAT 6-24 HRS) Nasopharyngeal Nasopharyngeal Swab     Status: None   Collection Time: 09/03/19  6:25 PM   Specimen: Nasopharyngeal Swab  Result Value Ref Range   SARS Coronavirus 2 NEGATIVE NEGATIVE    Comment: (NOTE) SARS-CoV-2 target nucleic acids are NOT DETECTED. The SARS-CoV-2 RNA is generally detectable in upper and lower respiratory specimens during the acute phase of infection. Negative results do not preclude SARS-CoV-2 infection, do not rule out co-infections with other pathogens, and should not be used as the sole basis for treatment or other patient management decisions. Negative results must be combined with clinical observations, patient history, and epidemiological information. The expected result is Negative. Fact Sheet for Patients: SugarRoll.be Fact Sheet for Healthcare Providers: https://www.woods-mathews.com/ This test is not yet approved or cleared by the [place] FDA and  has been authorized for detection and/or diagnosis of  SARS-CoV-2 by FDA under an Emergency Use Authorization (EUA). This EUA will remain  in effect (meaning this test can be used) for the duration of the COVID-19 declaration under Section 56 4(b)(1) of the Act, 21 U.S.C. section 360bbb-3(b)(1), unless the authorization is terminated or revoked sooner. Performed at [facility], [place] [address], [place], [place] [address]   Hemoglobin A1c     Status: Abnormal   Collection Time: 09/03/19  6:25 PM  Result Value Ref Range   Hgb A1c MFr Bld 10.6 (H) 4.8 - 5.6 %    Comment: (NOTE) Pre diabetes:          5.7%-6.4% Diabetes:              >6.4% Glycemic control for   <7.0% adults with diabetes    Mean Plasma Glucose 257.52 mg/dL    Comment: Performed at [facility] [address] [place], [place] [address]  Glucose, capillary     Status: Abnormal   Collection Time: 09/03/19 10:59 PM  Result Value Ref Range   Glucose-Capillary 202 (H) 70 - 99 mg/dL    Comment: Glucose reference range applies only to samples taken after fasting for at least 8 hours.  Magnesium     Status: None   Collection Time: 09/04/19  5:05 AM  Result Value Ref Range   Magnesium 2.2 1.7 - 2.4 mg/dL    Comment: Performed at [facility], [address] [place],  [address]  CBC WITH DIFFERENTIAL     Status: Abnormal  Collection Time: 09/04/19  5:05 AM  Result Value Ref Range   WBC 65.7 (HH) 4.0 - 10.5 K/uL    Comment: REPEATED TO VERIFY WHITE COUNT CONFIRMED ON SMEAR CRITICAL VALUE NOTED.  VALUE IS CONSISTENT WITH PREVIOUSLY REPORTED AND CALLED VALUE.    RBC 3.53 (L) 3.87 - 5.11 MIL/uL   Hemoglobin 9.9 (L) 12.0 - 15.0 g/dL   HCT 29.9 (L) 36.0 - 46.0 %   MCV 84.7 80.0 - 100.0 fL   MCH 28.0 26.0 - 34.0 pg   MCHC 33.1 30.0 - 36.0 g/dL   RDW 14.6 11.5 - 15.5 %   Platelets 142 (L) 150 - 400 K/uL   nRBC 0.0 0.0 - 0.2 %   Neutrophils Relative % 87 %   Neutro Abs 56.7 (H) 1.7 - 7.7 K/uL   Lymphocytes Relative 3 %   Lymphs Abs 2.1 0.7 -  4.0 K/uL   Monocytes Relative 1 %   Monocytes Absolute 0.7 0.1 - 1.0 K/uL   Eosinophils Relative 0 %   Eosinophils Absolute 0.1 0.0 - 0.5 K/uL   Basophils Relative 0 %   Basophils Absolute 0.1 0.0 - 0.1 K/uL   WBC Morphology MILD LEFT SHIFT (1-5% METAS, OCC MYELO, OCC BANDS)    RBC Morphology MORPHOLOGY UNREMARKABLE    Smear Review MORPHOLOGY UNREMARKABLE    Immature Granulocytes 9 %   Abs Immature Granulocytes 6.05 (H) 0.00 - 0.07 K/uL    Comment: Performed at [facility], [address], [place], [place] [address]  Basic metabolic panel     Status: Abnormal   Collection Time: 09/04/19  5:05 AM  Result Value Ref Range   Sodium 142 135 - 145 mmol/L   Potassium 4.6 3.5 - 5.1 mmol/L   Chloride 110 98 - 111 mmol/L   CO2 27 22 - 32 mmol/L   Glucose, Bld 202 (H) 70 - 99 mg/dL    Comment: Glucose reference range applies only to samples taken after fasting for at least 8 hours.   BUN 19 8 - 23 mg/dL   Creatinine, Ser 0.80 0.44 - 1.00 mg/dL   Calcium 9.3 8.9 - 10.3 mg/dL   GFR calc non Af Amer >60 >60 mL/min   GFR calc Af Amer >60 >60 mL/min   Anion gap 5 5 - 15    Comment: Performed at [facility], [address] [place], [place] [address]  HIV Antibody (routine testing w rflx)     Status: None   Collection Time: 09/04/19  5:05 AM  Result Value Ref Range   HIV Screen 4th Generation wRfx NON REACTIVE NON REACTIVE    Comment: Performed at [facility] [address], [place], [place] [address]  Glucose, capillary     Status: Abnormal   Collection Time: 09/04/19  7:21 AM  Result Value Ref Range   Glucose-Capillary 170 (H) 70 - 99 mg/dL    Comment: Glucose reference range applies only to samples taken after fasting for at least 8 hours.  ECHOCARDIOGRAM COMPLETE     Status: None   Collection Time: 09/04/19 10:00 AM  Result Value Ref Range   Weight 2,707.2 oz   Height 61 in   BP 175/83 mmHg  Glucose, capillary     Status: Abnormal   Collection Time:  09/04/19 11:13 AM  Result Value Ref Range   Glucose-Capillary 229 (H) 70 - 99 mg/dL    Comment: Glucose reference range applies only to samples taken after fasting for at  least 8 hours.  Comprehensive metabolic panel     Status: Abnormal   Collection Time: 09/15/19  8:51 AM  Result Value Ref Range   Sodium 136 135 - 145 mmol/L   Potassium 3.5 3.5 - 5.1 mmol/L   Chloride 103 98 - 111 mmol/L   CO2 22 22 - 32 mmol/L   Glucose, Bld 211 (H) 70 - 99 mg/dL    Comment: Glucose reference range applies only to samples taken after fasting for at least 8 hours.   BUN 11 8 - 23 mg/dL   Creatinine, Ser 0.93 0.44 - 1.00 mg/dL   Calcium 8.7 (L) 8.9 - 10.3 mg/dL   Total Protein 6.4 (L) 6.5 - 8.1 g/dL   Albumin 3.6 3.5 - 5.0 g/dL   AST 16 15 - 41 U/L   ALT 12 0 - 44 U/L   Alkaline Phosphatase 52 38 - 126 U/L   Total Bilirubin 0.5 0.3 - 1.2 mg/dL   GFR calc non Af Amer >60 >60 mL/min   GFR calc Af Amer >60 >60 mL/min   Anion gap 11 5 - 15    Comment: Performed at [facility], [address] [place], [place] [address]  CBC with Differential     Status: Abnormal   Collection Time: 09/15/19  8:51 AM  Result Value Ref Range   WBC 11.3 (H) 4.0 - 10.5 K/uL   RBC 3.32 (L) 3.87 - 5.11 MIL/uL   Hemoglobin 9.5 (L) 12.0 - 15.0 g/dL   HCT 28.0 (L) 36.0 - 46.0 %   MCV 84.3 80.0 - 100.0 fL   MCH 28.6 26.0 - 34.0 pg   MCHC 33.9 30.0 - 36.0 g/dL   RDW 15.8 (H) 11.5 - 15.5 %   Platelets 226 150 - 400 K/uL   nRBC 0.0 0.0 - 0.2 %   Neutrophils Relative % 71 %   Neutro Abs 8.1 (H) 1.7 - 7.7 K/uL   Lymphocytes Relative 15 %   Lymphs Abs 1.7 0.7 - 4.0 K/uL   Monocytes Relative 10 %   Monocytes Absolute 1.1 (H) 0.1 - 1.0 K/uL   Eosinophils Relative 1 %   Eosinophils Absolute 0.1 0.0 - 0.5 K/uL   Basophils Relative 1 %   Basophils Absolute 0.1 0.0 - 0.1 K/uL   Immature Granulocytes 2 %   Abs Immature Granulocytes 0.20 (H) 0.00 - 0.07 K/uL    Comment: Performed at [facility], [address] [place], [place] [address]  Comprehensive metabolic panel     Status: Abnormal   Collection Time: 09/21/19 11:20 AM  Result Value Ref Range   Sodium 139 135 - 145 mmol/L   Potassium 3.7 3.5 - 5.1 mmol/L   Chloride 105 98 - 111 mmol/L   CO2 23 22 - 32 mmol/L   Glucose, Bld 110 (H) 70 - 99 mg/dL    Comment: Glucose reference range applies only to samples taken after fasting for at least 8 hours.   BUN 15 8 - 23 mg/dL   Creatinine, Ser 0.92 0.44 - 1.00 mg/dL   Calcium 9.2 8.9 - 10.3 mg/dL   Total Protein 7.2 6.5 - 8.1 g/dL   Albumin 3.9 3.5 - 5.0 g/dL   AST 19 15 - 41 U/L   ALT 15 0 - 44 U/L   Alkaline Phosphatase 42 38 - 126 U/L   Total Bilirubin 0.5 0.3 - 1.2 mg/dL   GFR calc non Af Amer >60 >60 mL/min   GFR  calc Af Amer >60 >60 mL/min   Anion gap 11 5 - 15    Comment: Performed at [facility], [address] [place], [place] [address]  CBC with Differential     Status: Abnormal   Collection Time: 09/21/19 11:20 AM  Result Value Ref Range   WBC 8.6 4.0 - 10.5 K/uL   RBC 3.71 (L) 3.87 - 5.11 MIL/uL   Hemoglobin 10.6 (L) 12.0 - 15.0 g/dL   HCT 31.5 (L) 36.0 - 46.0 %   MCV 84.9 80.0 - 100.0 fL   MCH 28.6 26.0 - 34.0 pg   MCHC 33.7 30.0 - 36.0 g/dL   RDW 17.3 (H) 11.5 - 15.5 %   Platelets 372 150 - 400 K/uL   nRBC 0.0 0.0 - 0.2 %   Neutrophils Relative % 65 %   Neutro Abs 5.6 1.7 - 7.7 K/uL   Lymphocytes Relative 18 %   Lymphs Abs 1.5 0.7 - 4.0 K/uL   Monocytes Relative 12 %   Monocytes Absolute 1.0 0.1 - 1.0 K/uL   Eosinophils Relative 3 %   Eosinophils Absolute 0.3 0.0 - 0.5 K/uL   Basophils Relative 1 %   Basophils Absolute 0.1 0.0 - 0.1 K/uL   Immature Granulocytes 1 %   Abs Immature Granulocytes 0.05 0.00 - 0.07 K/uL    Comment: Performed at [facility], [address] [place], [place] [address]  Urinalysis, Routine w reflex microscopic     Status: Abnormal   Collection Time: 09/29/19 11:04 AM  Result Value Ref Range   Specific Gravity, UA  1.015 1.005 - 1.030   pH, UA 5.5 5.0 - 7.5   Color, UA Yellow Yellow   Appearance Ur Cloudy (A) Clear   Leukocytes,UA Negative Negative   Protein,UA Negative Negative/Trace   Glucose, UA Negative Negative   Ketones, UA Negative Negative   RBC, UA Negative Negative   Bilirubin, UA Negative Negative   Urobilinogen, Ur 1.0 0.2 - 1.0 mg/dL   Nitrite, UA Negative Negative   Microscopic Examination Comment     Comment: Microscopic not indicated and not performed.  Comprehensive metabolic panel     Status: Abnormal   Collection Time: 10/06/19  8:07 AM  Result Value Ref Range   Sodium 138 135 - 145 mmol/L   Potassium 3.7 3.5 - 5.1 mmol/L   Chloride 104 98 - 111 mmol/L   CO2 23 22 - 32 mmol/L   Glucose, Bld 152 (H) 70 - 99 mg/dL    Comment: Glucose reference range applies only to samples taken after fasting for at least 8 hours.   BUN 10 8 - 23 mg/dL   Creatinine, Ser 0.99 0.44 - 1.00 mg/dL   Calcium 9.1 8.9 - 10.3 mg/dL   Total Protein 7.1 6.5 - 8.1 g/dL   Albumin 3.9 3.5 - 5.0 g/dL   AST 24 15 - 41 U/L   ALT 20 0 - 44 U/L   Alkaline Phosphatase 47 38 - 126 U/L   Total Bilirubin 0.5 0.3 - 1.2 mg/dL   GFR calc non Af Amer >60 >60 mL/min   GFR calc Af Amer >60 >60 mL/min   Anion gap 11 5 - 15    Comment: Performed at [facility], [address] [place], [place] [address]  CBC with Differential     Status: Abnormal   Collection Time: 10/06/19  8:07 AM  Result Value Ref Range   WBC 13.5 (H) 4.0 - 10.5 K/uL  RBC 3.44 (L) 3.87 - 5.11 MIL/uL   Hemoglobin 9.8 (L) 12.0 - 15.0 g/dL   HCT 29.8 (L) 36.0 - 46.0 %   MCV 86.6 80.0 - 100.0 fL   MCH 28.5 26.0 - 34.0 pg   MCHC 32.9 30.0 - 36.0 g/dL   RDW 17.6 (H) 11.5 - 15.5 %   Platelets 227 150 - 400 K/uL   nRBC 0.0 0.0 - 0.2 %   Neutrophils Relative % 74 %   Neutro Abs 9.9 (H) 1.7 - 7.7 K/uL   Lymphocytes Relative 12 %   Lymphs Abs 1.7 0.7 - 4.0 K/uL   Monocytes Relative 10 %   Monocytes Absolute 1.4 (H) 0.1 - 1.0 K/uL    Eosinophils Relative 2 %   Eosinophils Absolute 0.3 0.0 - 0.5 K/uL   Basophils Relative 1 %   Basophils Absolute 0.1 0.0 - 0.1 K/uL   Immature Granulocytes 1 %   Abs Immature Granulocytes 0.15 (H) 0.00 - 0.07 K/uL    Comment: Performed at [facility], [address] [place], [place] [address]  Comprehensive metabolic panel     Status: Abnormal   Collection Time: 10/11/19  8:12 AM  Result Value Ref Range   Sodium 136 135 - 145 mmol/L   Potassium 3.7 3.5 - 5.1 mmol/L   Chloride 104 98 - 111 mmol/L   CO2 24 22 - 32 mmol/L   Glucose, Bld 131 (H) 70 - 99 mg/dL    Comment: Glucose reference range applies only to samples taken after fasting for at least 8 hours.   BUN 7 (L) 8 - 23 mg/dL   Creatinine, Ser 0.80 0.44 - 1.00 mg/dL   Calcium 8.7 (L) 8.9 - 10.3 mg/dL   Total Protein 6.7 6.5 - 8.1 g/dL   Albumin 3.5 3.5 - 5.0 g/dL   AST 15 15 - 41 U/L   ALT 16 0 - 44 U/L   Alkaline Phosphatase 38 38 - 126 U/L   Total Bilirubin 0.5 0.3 - 1.2 mg/dL   GFR calc non Af Amer >60 >60 mL/min   GFR calc Af Amer >60 >60 mL/min   Anion gap 8 5 - 15    Comment: Performed at [facility], [address] [place], [place] [address]  CBC with Differential     Status: Abnormal   Collection Time: 10/11/19  8:12 AM  Result Value Ref Range   WBC 7.1 4.0 - 10.5 K/uL   RBC 3.12 (L) 3.87 - 5.11 MIL/uL   Hemoglobin 9.1 (L) 12.0 - 15.0 g/dL   HCT 26.8 (L) 36.0 - 46.0 %   MCV 85.9 80.0 - 100.0 fL   MCH 29.2 26.0 - 34.0 pg   MCHC 34.0 30.0 - 36.0 g/dL   RDW 17.9 (H) 11.5 - 15.5 %   Platelets 304 150 - 400 K/uL   nRBC 0.0 0.0 - 0.2 %   Neutrophils Relative % 63 %   Neutro Abs 4.5 1.7 - 7.7 K/uL   Lymphocytes Relative 18 %   Lymphs Abs 1.3 0.7 - 4.0 K/uL   Monocytes Relative 14 %   Monocytes Absolute 1.0 0.1 - 1.0 K/uL   Eosinophils Relative 3 %   Eosinophils Absolute 0.2 0.0 - 0.5 K/uL   Basophils Relative 1 %   Basophils Absolute 0.1 0.0 - 0.1 K/uL   Immature Granulocytes 1 %   Abs  Immature Granulocytes 0.04 0.00 - 0.07 K/uL    Comment: Performed at [facility],  [address] [place], [place] [address]    .Assessment/Plan: 1. Encounter for general adult medical examination with abnormal findings Annual health maintenance exam today. Lab order given for routine, fasting labs.   2. Uncontrolled type 2 diabetes mellitus with hyperglycemia (HCC) No changes made to diabetic medication. New prescription sent to pharmacy for lancets. Continue blood sugar testing two to three times per day. Will adjust medications as indicated.  - TRUEplus Lancets 30G MISC; Use as directed twice daily diag e11.65  Dispense: 100 each; Refill: 3  3. Essential hypertension Stable. Continue bp medication as prescribed. Regular visits with cardiology as scheduled   4. Malignant neoplasm of upper-outer quadrant of right breast in female, estrogen receptor negative ([place]) Patient has tolerated chemotherapy and radiation well. Continue regular visits with oncology as scheduled.   5. Dysuria - Urinalysis, Routine w reflex microscopic  General Counseling: Minetta verbalizes understanding of the findings of todays visit and agrees with plan of treatment. I have discussed any further diagnostic evaluation that may be needed or ordered today. We also reviewed her medications today. she has been encouraged to call the office with any questions or concerns that should arise related to todays visit.    Counseling:  Diabetes Counseling:  1. Addition of ACE inh/ ARB'S for nephroprotection. Microalbumin is updated  2. Diabetic foot care, prevention of complications. Podiatry consult 3. Exercise and lose weight.  4. Diabetic eye examination, Diabetic eye exam is updated  5. Monitor blood sugar closlely. nutrition counseling.  6. Sign and symptoms of hypoglycemia including shaking sweating,confusion and headaches.  This patient was seen by Leretha Pol FNP Collaboration with Dr Lavera Guise as  a part of collaborative care agreement  Orders Placed This Encounter  Procedures  . Urinalysis, Routine w reflex microscopic    Meds ordered this encounter  Medications  . DISCONTD: glucose blood (TRUE METRIX BLOOD GLUCOSE TEST) test strip    Sig: Use as instructed twice a daily diag e11.65    Dispense:  100 each    Refill:  3    Order Specific Question:   Supervising Provider    Answer:   Lavera Guise [place]  . TRUEplus Lancets 30G MISC    Sig: Use as directed twice daily diag e11.65    Dispense:  100 each    Refill:  3    Order Specific Question:   Supervising Provider    Answer:   Lavera Guise [[id number]]    Total time spent: 65 Minutes  Time spent includes review of chart, medications, test results, and follow up plan with the patient.     Lavera Guise, MD  Internal Medicine

## 2019-09-30 LAB — URINALYSIS, ROUTINE W REFLEX MICROSCOPIC
Bilirubin, UA: NEGATIVE
Glucose, UA: NEGATIVE
Ketones, UA: NEGATIVE
Leukocytes,UA: NEGATIVE
Nitrite, UA: NEGATIVE
Protein,UA: NEGATIVE
RBC, UA: NEGATIVE
Specific Gravity, UA: 1.015 (ref 1.005–1.030)
Urobilinogen, Ur: 1 mg/dL (ref 0.2–1.0)
pH, UA: 5.5 (ref 5.0–7.5)

## 2019-10-03 ENCOUNTER — Ambulatory Visit
Admission: RE | Admit: 2019-10-03 | Discharge: 2019-10-03 | Disposition: A | Discharge status: Home / Self Care | Payer: Medicare HMO | Source: Ambulatory Visit | Attending: Oncology | Admitting: Oncology

## 2019-10-03 DIAGNOSIS — N6311 Unspecified lump in the right breast, upper outer quadrant: Secondary | ICD-10-CM | POA: Diagnosis not present

## 2019-10-03 DIAGNOSIS — R928 Other abnormal and inconclusive findings on diagnostic imaging of breast: Secondary | ICD-10-CM | POA: Diagnosis not present

## 2019-10-03 DIAGNOSIS — C50411 Malignant neoplasm of upper-outer quadrant of right female breast: Secondary | ICD-10-CM | POA: Insufficient documentation

## 2019-10-04 ENCOUNTER — Other Ambulatory Visit: Payer: Self-pay

## 2019-10-04 DIAGNOSIS — C50411 Malignant neoplasm of upper-outer quadrant of right female breast: Secondary | ICD-10-CM

## 2019-10-06 ENCOUNTER — Inpatient Hospital Stay (HOSPITAL_BASED_OUTPATIENT_CLINIC_OR_DEPARTMENT_OTHER): Payer: Medicare HMO | Admitting: Oncology

## 2019-10-06 ENCOUNTER — Other Ambulatory Visit: Payer: Self-pay | Admitting: *Deleted

## 2019-10-06 ENCOUNTER — Inpatient Hospital Stay: Payer: Medicare HMO

## 2019-10-06 ENCOUNTER — Inpatient Hospital Stay: Payer: Medicare HMO | Attending: Oncology

## 2019-10-06 ENCOUNTER — Encounter: Payer: Self-pay | Admitting: Oncology

## 2019-10-06 ENCOUNTER — Other Ambulatory Visit: Payer: Self-pay

## 2019-10-06 VITALS — BP 98/67 | HR 104 | Temp 95.6°F | Resp 18 | Wt 162.8 lb

## 2019-10-06 DIAGNOSIS — Z006 Encounter for examination for normal comparison and control in clinical research program: Secondary | ICD-10-CM | POA: Insufficient documentation

## 2019-10-06 DIAGNOSIS — Z171 Estrogen receptor negative status [ER-]: Secondary | ICD-10-CM | POA: Diagnosis not present

## 2019-10-06 DIAGNOSIS — D6481 Anemia due to antineoplastic chemotherapy: Secondary | ICD-10-CM | POA: Insufficient documentation

## 2019-10-06 DIAGNOSIS — Z87891 Personal history of nicotine dependence: Secondary | ICD-10-CM | POA: Insufficient documentation

## 2019-10-06 DIAGNOSIS — Z5111 Encounter for antineoplastic chemotherapy: Secondary | ICD-10-CM | POA: Insufficient documentation

## 2019-10-06 DIAGNOSIS — I951 Orthostatic hypotension: Secondary | ICD-10-CM | POA: Diagnosis not present

## 2019-10-06 DIAGNOSIS — I959 Hypotension, unspecified: Secondary | ICD-10-CM | POA: Diagnosis not present

## 2019-10-06 DIAGNOSIS — C50411 Malignant neoplasm of upper-outer quadrant of right female breast: Secondary | ICD-10-CM | POA: Insufficient documentation

## 2019-10-06 DIAGNOSIS — Z79899 Other long term (current) drug therapy: Secondary | ICD-10-CM | POA: Diagnosis not present

## 2019-10-06 DIAGNOSIS — E860 Dehydration: Secondary | ICD-10-CM

## 2019-10-06 DIAGNOSIS — T451X5A Adverse effect of antineoplastic and immunosuppressive drugs, initial encounter: Secondary | ICD-10-CM | POA: Diagnosis not present

## 2019-10-06 DIAGNOSIS — Z95828 Presence of other vascular implants and grafts: Secondary | ICD-10-CM

## 2019-10-06 LAB — CBC WITH DIFFERENTIAL/PLATELET
Abs Immature Granulocytes: 0.15 10*3/uL — ABNORMAL HIGH (ref 0.00–0.07)
Basophils Absolute: 0.1 10*3/uL (ref 0.0–0.1)
Basophils Relative: 1 %
Eosinophils Absolute: 0.3 10*3/uL (ref 0.0–0.5)
Eosinophils Relative: 2 %
HCT: 29.8 % — ABNORMAL LOW (ref 36.0–46.0)
Hemoglobin: 9.8 g/dL — ABNORMAL LOW (ref 12.0–15.0)
Immature Granulocytes: 1 %
Lymphocytes Relative: 12 %
Lymphs Abs: 1.7 10*3/uL (ref 0.7–4.0)
MCH: 28.5 pg (ref 26.0–34.0)
MCHC: 32.9 g/dL (ref 30.0–36.0)
MCV: 86.6 fL (ref 80.0–100.0)
Monocytes Absolute: 1.4 10*3/uL — ABNORMAL HIGH (ref 0.1–1.0)
Monocytes Relative: 10 %
Neutro Abs: 9.9 10*3/uL — ABNORMAL HIGH (ref 1.7–7.7)
Neutrophils Relative %: 74 %
Platelets: 227 10*3/uL (ref 150–400)
RBC: 3.44 MIL/uL — ABNORMAL LOW (ref 3.87–5.11)
RDW: 17.6 % — ABNORMAL HIGH (ref 11.5–15.5)
WBC: 13.5 10*3/uL — ABNORMAL HIGH (ref 4.0–10.5)
nRBC: 0 % (ref 0.0–0.2)

## 2019-10-06 LAB — COMPREHENSIVE METABOLIC PANEL
ALT: 20 U/L (ref 0–44)
AST: 24 U/L (ref 15–41)
Albumin: 3.9 g/dL (ref 3.5–5.0)
Alkaline Phosphatase: 47 U/L (ref 38–126)
Anion gap: 11 (ref 5–15)
BUN: 10 mg/dL (ref 8–23)
CO2: 23 mmol/L (ref 22–32)
Calcium: 9.1 mg/dL (ref 8.9–10.3)
Chloride: 104 mmol/L (ref 98–111)
Creatinine, Ser: 0.99 mg/dL (ref 0.44–1.00)
GFR calc Af Amer: >60 mL/min (ref >60)
GFR calc non Af Amer: >60 mL/min (ref >60)
Glucose, Bld: 152 mg/dL — ABNORMAL HIGH (ref 70–99)
Potassium: 3.7 mmol/L (ref 3.5–5.1)
Sodium: 138 mmol/L (ref 135–145)
Total Bilirubin: 0.5 mg/dL (ref 0.3–1.2)
Total Protein: 7.1 g/dL (ref 6.5–8.1)

## 2019-10-06 MED ORDER — HEPARIN SOD (PORK) LOCK FLUSH 100 UNIT/ML IV SOLN
500.0000 [IU] | Freq: Once | INTRAVENOUS | Status: AC
  Administered 2019-10-06: 500 [IU] via INTRAVENOUS
  Filled 2019-10-06: qty 5

## 2019-10-06 MED ORDER — SODIUM CHLORIDE 0.9% FLUSH
10.0000 mL | INTRAVENOUS | Status: DC | PRN
  Administered 2019-10-06: 10 mL via INTRAVENOUS
  Filled 2019-10-06: qty 10

## 2019-10-06 MED ORDER — HEPARIN SOD (PORK) LOCK FLUSH 100 UNIT/ML IV SOLN
INTRAVENOUS | Status: AC
  Filled 2019-10-06: qty 5

## 2019-10-06 MED ORDER — SODIUM CHLORIDE 0.9 % IV SOLN
Freq: Once | INTRAVENOUS | Status: AC
  Administered 2019-10-06: 1000 mL via INTRAVENOUS
  Filled 2019-10-06: qty 250

## 2019-10-06 NOTE — Progress Notes (Signed)
Patient here for follow up. No new breast problems voiced. Pt's bp low, no s/s of hypotension.

## 2019-10-06 NOTE — Progress Notes (Signed)
Hematology/Oncology follow up note [facility] [phone] [phone]   Patient Care Team: Lavera Guise, MD as PCP - General (Internal Medicine) Kate Sable, MD as PCP - Cardiology (Cardiology) Rico Junker, RN as Registered Nurse Theodore Demark, RN as Registered Nurse  REFERRING PROVIDER: Lavera Guise, MD  CHIEF COMPLAINTS/REASON FOR VISIT:  Follow-up for breast cancer  HISTORY OF PRESENTING ILLNESS:   Dawn Alvarez is a  64 y.o.  female with PMH listed below was seen in consultation at the request of  Lavera Guise, MD  for evaluation of breast cancer Patient had screening mammogram done 08/12/2017 which showed right breast asymmetry.  A diagnostic mammogram was suggested and patient did not have it done. Patient felt her right breast mass for a few weeks.  She had bilateral diagnostic mammogram done on 06/30/2019. 2.8 x 2.4 x 2.6 cm right breast mass, 11:00, 10 cm from the nipple.  There is a single mildly abnormal node in the right axilla with a cortex measuring up to 4.4 mm.  No other suspicious findings. Patient underwent ultrasound-guided core biopsy of the right breast mass and right axilla lymph node Pathology showed invasive mammary carcinoma, no special type, grade 3, ER/PR HER-2 status are pending. Right axillary lymph node biopsy showed predominantly blood in the fibroadipose tissue, with scant lymphoid tissue present.  No definite malignancy was identified.  Patient was referred to cancer center to establish care and discuss treatment plan. Menarche 59 Postmenopausal.  LMP when she was 64 years old. She recalls use of birth control pills. Denies any hormone .  Replacement therapy. Denies any prior chest radiation. She reports family history of maternal grandmother and 2 maternal cousins were diagnosed with cancer.  She does not know about details. # 09/03/2019, patient had an episode of syncope and EMS was called  and patient sent to emergency room.  Patient was noted to have a heart rate 45-65 and blood glucose level of 286. Patient denies any history of nausea, vomiting, diarrhea.  Denies any dehydration.  She reported history of intermittent abdominal pain. CT head without contrast showed no acute intracranial abnormality.  CT abdomen pelvis with contrast is negative for evidence of acute abdominal abnormality.  Patient was given IV fluid. Patient was observed with holding her blood pressure medication including amlodipine, bisoprolol.  No arrhythmia was noted on telemetry.  Echocardiogram showed LVEF 60 to 65%.  No valvular abnormalities.  Beta-blocker and diuretics was discontinued.  # Patient was seen by cardiology Dr. Garen Lah for evaluation of syncope and was cleared for chemotherapy.  INTERVAL HISTORY Dawn Alvarez is a 64 y.o. female who has above history reviewed by me today presents for follow up visit for management of breast cancer, evaluation prior to chemotherapy. Problems and complaints are listed below: Patient's blood pressure is 98/67, heart rate is 104.  Patient is orthostatic as well in the clinic.  Denies lightheaded Patient has been on same blood pressure regimen amlodipine  lisinopril and hydralazine. Per patient's daughter, patient continues to have intermittent episodes of sweating and lightheaded.  Recent episode happened when patient was shopping with her daughter in [place].  Patient symptoms resolved after sitting back in her car. Denies nausea, vomiting, diarrhea. Review of Systems  Constitutional: Negative for appetite change, chills, fatigue and fever.  HENT:   Negative for hearing loss and voice change.   Eyes: Negative for eye problems.  Respiratory: Negative for chest tightness and cough.  Cardiovascular: Negative for chest pain.  Gastrointestinal: Negative for abdominal distention, abdominal pain and blood in stool.  Endocrine: Negative for hot flashes.    Genitourinary: Negative for difficulty urinating and frequency.   Musculoskeletal: Negative for arthralgias.  Skin: Negative for itching and rash.  Neurological: Negative for extremity weakness.  Hematological: Negative for adenopathy.  Psychiatric/Behavioral: Negative for confusion.    MEDICAL HISTORY:  Past Medical History:  Diagnosis Date  . Asthma   . Cancer ([place])    breast  . COPD exacerbation ([place]) 04/12/2016  . Diabetes mellitus without complication ([place])   . Hyperlipemia   . Hypertension   . Personal history of chemotherapy     SURGICAL HISTORY: Past Surgical History:  Procedure Laterality Date  . BREAST BIOPSY Right 07/05/2019   [place] bx venus marker, grade 3 invasive mammary carcinoma  . BREAST BIOPSY Right 07/05/2019   LN bx, hydromarker,PREDOMINANTLY BLOOD AND FIBROADIPOSE TISSUE, WITH SCANT LYMPHOID TISSUE PRESENT      . CESAREAN SECTION    . COLONOSCOPY WITH PROPOFOL N/A 09/11/2015   Procedure: COLONOSCOPY WITH PROPOFOL;  Surgeon: Lucilla Lame, MD;  Location: [facility] ENDOSCOPY;  Service: Endoscopy;  Laterality: N/A;  . IR IMAGING GUIDED PORT INSERTION  07/20/2019    SOCIAL HISTORY: Social History   Socioeconomic History  . Marital status: Married    Spouse name: Not on file  . Number of children: Not on file  . Years of education: Not on file  . Highest education level: Not on file  Occupational History  . Not on file  Tobacco Use  . Smoking status: Former Smoker    Packs/day: 1.00    Years: 2.00    Pack years: 2.00    Types: Cigarettes    Quit date: 05/19/2019    Years since quitting: 0.3  . Smokeless tobacco: Never Used  Substance and Sexual Activity  . Alcohol use: Not Currently  . Drug use: No  . Sexual activity: Never    Birth control/protection: Abstinence  Other Topics Concern  . Not on file  Social History Narrative  . Not on file   Social Determinants of Health   Financial Resource Strain:   . Difficulty of Paying Living Expenses:    Food Insecurity:   . Worried About Charity fundraiser in the Last Year:   . Arboriculturist in the Last Year:   Transportation Needs:   . Film/video editor (Medical):   Marland Kitchen Lack of Transportation (Non-Medical):   Physical Activity:   . Days of Exercise per Week:   . Minutes of Exercise per Session:   Stress:   . Feeling of Stress :   Social Connections:   . Frequency of Communication with Friends and Family:   . Frequency of Social Gatherings with Friends and Family:   . Attends Religious Services:   . Active Member of Clubs or Organizations:   . Attends Archivist Meetings:   Marland Kitchen Marital Status:   Intimate Partner Violence:   . Fear of Current or Ex-Partner:   . Emotionally Abused:   Marland Kitchen Physically Abused:   . Sexually Abused:     FAMILY HISTORY: Family History  Problem Relation Age of Onset  . Diabetes Mother   . Hypertension Mother   . Hypertension Father   . Diabetes Father     ALLERGIES:  has No Known Allergies.  MEDICATIONS:  Current Outpatient Medications  Medication Sig Dispense Refill  . Alcohol Swabs (B-D SINGLE USE SWABS  REGULAR) PADS Use as directed twice a day 300 each 3  . amLODipine (NORVASC) 5 MG tablet Take one tab po qd for htn 30 tablet 3  . Blood Glucose Monitoring Suppl (TRUE METRIX AIR GLUCOSE METER) w/Device KIT 1 Device by Does not apply route in the morning and at bedtime. Use ad directed e11.65 1 kit 0  . brimonidine (ALPHAGAN) 0.2 % ophthalmic solution Place 1 drop into both eyes 2 (two) times daily.    Marland Kitchen dexamethasone (DECADRON) 4 MG tablet Take 4 mg by mouth daily.     Marland Kitchen glucose blood (TRUE METRIX BLOOD GLUCOSE TEST) test strip Use as instructed twice a daily diag e11.65 100 each 3  . insulin detemir (LEVEMIR FLEXTOUCH) 100 UNIT/ML FlexPen Inject 44 Units into the skin daily. 15 mL 3  . Insulin Pen Needle (PEN NEEDLES) 31G X 8 MM MISC Use as directed with insulin E11.65 100 each 1  . Lancets Misc. (ACCU-CHEK MULTICLIX LANCET DEV)  KIT by Does not apply route. Use as directed twice a day diag E11.65    . lidocaine-prilocaine (EMLA) cream     . lisinopril (ZESTRIL) 20 MG tablet Take one tab po qd for HTN 30 tablet 3  . Omega-3 Fatty Acids (FISH OIL) 1000 MG CAPS Take 1,000 mg by mouth daily.     . prochlorperazine (COMPAZINE) 10 MG tablet Take 1 tablet (10 mg total) by mouth every 6 (six) hours as needed for nausea or vomiting. 60 tablet 1  . Semaglutide, 1 MG/DOSE, (OZEMPIC, 1 MG/DOSE,) 2 MG/1.5ML SOPN Inject 1 mg into the skin once a week. 5 pen 3  . SIMBRINZA 1-0.2 % SUSP     . TRUEplus Lancets 30G MISC Use as directed twice daily diag e11.65 100 each 3   No current facility-administered medications for this visit.     PHYSICAL EXAMINATION: ECOG PERFORMANCE STATUS: 1 - Symptomatic but completely ambulatory There were no vitals filed for this visit. There were no vitals filed for this visit.  Physical Exam Constitutional:      General: She is not in acute distress. HENT:     Head: Normocephalic and atraumatic.  Eyes:     General: No scleral icterus. Cardiovascular:     Rate and Rhythm: Normal rate and regular rhythm.     Heart sounds: Normal heart sounds.  Pulmonary:     Effort: Pulmonary effort is normal. No respiratory distress.     Breath sounds: No wheezing.  Abdominal:     General: Bowel sounds are normal. There is no distension.     Palpations: Abdomen is soft.  Musculoskeletal:        General: No deformity. Normal range of motion.     Cervical back: Normal range of motion and neck supple.  Skin:    General: Skin is warm and dry.     Findings: No erythema or rash.  Neurological:     Mental Status: She is alert and oriented to person, place, and time. Mental status is at baseline.     Cranial Nerves: No cranial nerve deficit.     Coordination: Coordination normal.  Psychiatric:        Mood and Affect: Mood normal.    Right breast examination showed decreased size of 11:00 mass.  No palpable  axillary lymphadenopathy.   LABORATORY DATA:  I have reviewed the data as listed Lab Results  Component Value Date   WBC 8.6 09/21/2019   HGB 10.6 (L) 09/21/2019   HCT 31.5 (  L) 09/21/2019   MCV 84.9 09/21/2019   PLT 372 09/21/2019   Recent Labs    09/03/19 1825 09/03/19 1825 09/04/19 0505 09/15/19 0851 09/21/19 1120  NA 140   < > 142 136 139  K 3.4*   < > 4.6 3.5 3.7  CL 107   < > 110 103 105  CO2 22   < > _0 GLUCOSE 253*   < > 202* 211* 110*  BUN 27*   < > _1 CREATININE 1.01*   < > 0.80 0.93 0.92  CALCIUM 9.2   < > 9.3 8.7* 9.2  GFRNONAA 59*   < > >60 >60 >60  GFRAA >60   < > >60 >60 >60  PROT 6.4*  --   --  6.4* 7.2  ALBUMIN 3.5  --   --  3.6 3.9  AST 14*  --   --  16 19  ALT 12  --   --  12 15  ALKPHOS 62  --   --  52 42  BILITOT 0.7  --   --  0.5 0.5   < > = values in this interval not displayed.   Iron/TIBC/Ferritin/ %Sat No results found for: IRON, TIBC, FERRITIN, IRONPCTSAT    RADIOGRAPHIC STUDIES: I have personally reviewed the radiological images as listed and agreed with the findings in the report. CT Head Wo Contrast  Result Date: 09/03/2019 CLINICAL DATA:  Encephalopathy. EXAM: CT HEAD WITHOUT CONTRAST TECHNIQUE: Contiguous axial images were obtained from the base of the skull through the vertex without intravenous contrast. COMPARISON:  Head CT 07/30/2019 FINDINGS: Brain: Brain volume is normal for age. No intracranial hemorrhage, mass effect, or midline shift. No hydrocephalus. The basilar cisterns are patent. Unchanged periventricular and deep chronic small vessel ischemia. Punctate lacunar infarct in the right thalamus. No evidence of territorial infarct or acute ischemia. No extra-axial or intracranial fluid collection. Vascular: No hyperdense vessel. Skull: Normal. Negative for fracture or focal lesion. Sinuses/Orbits: Paranasal sinuses and mastoid air cells are clear. The visualized orbits are unremarkable. Other: None. IMPRESSION: 1. No  acute intracranial abnormality. 2. Stable chronic small vessel ischemia from prior. Electronically Signed   By: Keith Rake M.D.   On: 09/03/2019 19:53   CT Head Wo Contrast  Result Date: 07/30/2019 CLINICAL DATA:  64 year old female with altered mental status EXAM: CT HEAD WITHOUT CONTRAST TECHNIQUE: Contiguous axial images were obtained from the base of the skull through the vertex without intravenous contrast. COMPARISON:  MR 05/04/2010, CT 12/28/2018 FINDINGS: Brain: No acute intracranial hemorrhage. No midline shift or mass effect. Gray-white differentiation maintained. Confluent hypodensity in the periventricular white matter, similar to the prior MR and CT. Unremarkable appearance of the ventricular system. Vascular: Mild atherosclerotic calcifications. Skull: No acute fracture.  No aggressive bone lesion identified. Sinuses/Orbits: Unremarkable appearance of the orbits. Mastoid air cells clear. No middle ear effusion. No significant sinus disease. Other: None IMPRESSION: Negative for acute intracranial abnormality. Chronic microvascular ischemic disease. Electronically Signed   By: Corrie Mckusick D.O.   On: 07/30/2019 14:46   NM Cardiac Muga Rest  Result Date: 07/25/2019 CLINICAL DATA:  Breast cancer. Evaluate cardiac function in relation to chemotherapy. EXAM: NUCLEAR MEDICINE CARDIAC BLOOD POOL IMAGING (MUGA) TECHNIQUE: Cardiac multi-gated acquisition was performed at rest following intravenous injection of Tc-81mlabeled red blood cells. RADIOPHARMACEUTICALS:  22.8 mCi Tc-965mertechnetate in-vitro labeled red blood cells IV COMPARISON:  None FINDINGS: No  focal wall motion abnormality of  the left ventricle. Calculated left ventricular ejection fraction equals 74 % IMPRESSION: Left ventricular ejection fraction equals74 %. Electronically Signed   By: Suzy Bouchard M.D.   On: 07/25/2019 15:16   CT ABDOMEN PELVIS W CONTRAST  Result Date: 09/03/2019 CLINICAL DATA:  Nausea vomiting. EXAM: CT  ABDOMEN AND PELVIS WITH CONTRAST TECHNIQUE: Multidetector CT imaging of the abdomen and pelvis was performed using the standard protocol following bolus administration of intravenous contrast. CONTRAST:  120m OMNIPAQUE IOHEXOL 300 MG/ML  SOLN COMPARISON:  April 20, 2016 FINDINGS: Lower chest: No acute abnormality. Hepatobiliary: No focal liver abnormality is seen. No gallstones, gallbladder wall thickening, or biliary dilatation. Pancreas: Unremarkable. No pancreatic ductal dilatation or surrounding inflammatory changes. Spleen: Normal in size without focal abnormality. Adrenals/Urinary Tract: Adrenal glands are unremarkable. Kidneys are normal, without renal calculi, solid lesion, or hydronephrosis. 2 cm right renal cyst. Bladder is unremarkable. Stomach/Bowel: Stomach is within normal limits. Appendix appears normal. No evidence of bowel wall thickening, distention, or inflammatory changes. Vascular/Lymphatic: Aortic atherosclerosis. No enlarged abdominal or pelvic lymph nodes. Reproductive: Numerous some calcified myometrial masses. Other: No abdominal wall hernia or abnormality. No abdominopelvic ascites. Musculoskeletal: L4-L5 spondylosis. IMPRESSION: 1. No evidence of acute abnormalities within the solid abdominal organs. 2. Myometrial fibroids. Aortic Atherosclerosis (ICD10-I70.0). Electronically Signed   By: DFidela SalisburyM.D.   On: 09/03/2019 19:54   [place] Limited Uni Right Inc Axilla  Result Date: 10/03/2019 CLINICAL DATA:  Ultrasound-guided core biopsy of mass in the RIGHT breast 10-11 o'clock location performed 07/05/2019, showing grade 3 invasive mammary carcinoma. Ultrasound-guided core biopsy of RIGHT axillary lymph node was benign and concordant. Patient has undergone neoadjuvant treatment scans feels the mass has gotten smaller. Assess for response to treatment. EXAM: DIGITAL DIAGNOSTIC RIGHT MAMMOGRAM WITH CAD AND TOMO ULTRASOUND RIGHT BREAST COMPARISON:  07/05/2019 and earlier ACR  Breast Density Category b: There are scattered areas of fibroglandular density. FINDINGS: Within the UPPER-OUTER QUADRANT of the RIGHT breast there is a spiculated mass, marked with a tissue marking clip. Lesion appears smaller compared to prior study. No new suspicious findings in the RIGHT breast. Mammographic images were processed with CAD. Targeted ultrasound is performed, showingirregular hypoechoic mass in the 11 o'clock location of the RIGHT breast 10 centimeters from the nipple, measuring 2.7 x 1.3 x 2.6 centimeters. Previously mass measured 2.8 x 2.4 x 2.6 centimeters. IMPRESSION: Smaller mass in the 11 o'clock location of the RIGHT breast. RECOMMENDATION: Treatment plan for known RIGHT breast cancer. I have discussed the findings and recommendations with the patient. If applicable, a reminder letter will be sent to the patient regarding the next appointment. BI-RADS CATEGORY  6: Known biopsy-proven malignancy. Electronically Signed   By: ENolon NationsM.D.   On: 10/03/2019 14:36   MM DIAG BREAST TOMO UNI RIGHT  Result Date: 10/03/2019 CLINICAL DATA:  Ultrasound-guided core biopsy of mass in the RIGHT breast 10-11 o'clock location performed 07/05/2019, showing grade 3 invasive mammary carcinoma. Ultrasound-guided core biopsy of RIGHT axillary lymph node was benign and concordant. Patient has undergone neoadjuvant treatment scans feels the mass has gotten smaller. Assess for response to treatment. EXAM: DIGITAL DIAGNOSTIC RIGHT MAMMOGRAM WITH CAD AND TOMO ULTRASOUND RIGHT BREAST COMPARISON:  07/05/2019 and earlier ACR Breast Density Category b: There are scattered areas of fibroglandular density. FINDINGS: Within the UPPER-OUTER QUADRANT of the RIGHT breast there is a spiculated mass, marked with a tissue marking clip. Lesion appears smaller compared to prior study. No new suspicious findings in the RIGHT breast.  Mammographic images were processed with CAD. Targeted ultrasound is performed,  showingirregular hypoechoic mass in the 11 o'clock location of the RIGHT breast 10 centimeters from the nipple, measuring 2.7 x 1.3 x 2.6 centimeters. Previously mass measured 2.8 x 2.4 x 2.6 centimeters. IMPRESSION: Smaller mass in the 11 o'clock location of the RIGHT breast. RECOMMENDATION: Treatment plan for known RIGHT breast cancer. I have discussed the findings and recommendations with the patient. If applicable, a reminder letter will be sent to the patient regarding the next appointment. BI-RADS CATEGORY  6: Known biopsy-proven malignancy. Electronically Signed   By: Nolon Nations M.D.   On: 10/03/2019 14:36   ECHOCARDIOGRAM COMPLETE  Result Date: 09/04/2019    ECHOCARDIOGRAM REPORT   Patient Name:   Dawn Alvarez Date of Exam: 09/04/2019 Medical Rec #:  [id number]        Height:       61.0 in Accession #:    [id number]       Weight:       169.2 lb Date of Birth:  06-24-1955        BSA:          1.759 m Patient Age:    50 years         BP:           175/83 mmHg Patient Gender: F                HR:           67 bpm. Exam Location:  [facility] Procedure: 2D Echo Indications:     Syncope 780.2/ R55  History:         Patient has no prior history of Echocardiogram examinations.  Sonographer:     Arville Go RDCS Referring Phys:  [place] Diagnosing Phys: Ida Rogue MD IMPRESSIONS  1. Left ventricular ejection fraction, by estimation, is 60 to 65%. The left ventricle has normal function. The left ventricle has no regional wall motion abnormalities. There is mild left ventricular hypertrophy. Left ventricular diastolic parameters were normal.  2. Right ventricular systolic function is normal. The right ventricular size is normal. Tricuspid regurgitation signal is inadequate for assessing PA pressure. FINDINGS  Left Ventricle: Left ventricular ejection fraction, by estimation, is 60 to 65%. The left ventricle has normal function. The left ventricle has no regional wall motion abnormalities. The left  ventricular internal cavity size was normal in size. There is  mild left ventricular hypertrophy. Left ventricular diastolic parameters were normal. Right Ventricle: The right ventricular size is normal. No increase in right ventricular wall thickness. Right ventricular systolic function is normal. Tricuspid regurgitation signal is inadequate for assessing PA pressure. Left Atrium: Left atrial size was normal in size. Right Atrium: Right atrial size was normal in size. Pericardium: There is no evidence of pericardial effusion. Mitral Valve: The mitral valve is normal in structure. Normal mobility of the mitral valve leaflets. Trivial mitral valve regurgitation. No evidence of mitral valve stenosis. Tricuspid Valve: The tricuspid valve is normal in structure. Tricuspid valve regurgitation is not demonstrated. No evidence of tricuspid stenosis. Aortic Valve: The aortic valve is normal in structure. Aortic valve regurgitation is not visualized. No aortic stenosis is present. Aortic valve mean gradient measures 7.0 mmHg. Aortic valve peak gradient measures 13.5 mmHg. Aortic valve area, by VTI measures 1.48 cm. Pulmonic Valve: The pulmonic valve was normal in structure. Pulmonic valve regurgitation is not visualized. No evidence of pulmonic stenosis. Aorta: The aortic root is  normal in size and structure. Venous: The inferior vena cava is normal in size with greater than 50% respiratory variability, suggesting right atrial pressure of 3 mmHg. IAS/Shunts: No atrial level shunt detected by color flow Doppler.  LEFT VENTRICLE PLAX 2D LVIDd:         4.11 cm  Diastology LVIDs:         2.51 cm  LV e' lateral:   9.25 cm/s LV PW:         1.35 cm  LV E/e' lateral: 11.8 LV IVS:        1.17 cm  LV e' medial:    7.51 cm/s LVOT diam:     1.90 cm  LV E/e' medial:  14.5 LV SV:         65 LV SV Index:   37 LVOT Area:     2.84 cm  RIGHT VENTRICLE RV Basal diam:  2.39 cm RV S prime:     19.00 cm/s TAPSE (M-mode): 2.1 cm LEFT ATRIUM              Index       RIGHT ATRIUM           Index LA diam:        3.30 cm 1.88 cm/m  RA Area:     10.60 cm LA Vol (A2C):   41.0 ml 23.31 ml/m RA Volume:   22.90 ml  13.02 ml/m LA Vol (A4C):   40.2 ml 22.85 ml/m LA Biplane Vol: 42.6 ml 24.22 ml/m  AORTIC VALVE                    PULMONIC VALVE AV Area (Vmax):    1.59 cm     PV Vmax:       0.84 m/s AV Area (Vmean):   1.59 cm     PV Peak grad:  2.8 mmHg AV Area (VTI):     1.48 cm AV Vmax:           184.00 cm/s AV Vmean:          117.000 cm/s AV VTI:            0.441 m AV Peak Grad:      13.5 mmHg AV Mean Grad:      7.0 mmHg LVOT Vmax:         103.00 cm/s LVOT Vmean:        65.500 cm/s LVOT VTI:          0.230 m LVOT/AV VTI ratio: 0.52  AORTA Ao Root diam: 2.60 cm MITRAL VALVE MV Area (PHT): 3.12 cm     SHUNTS MV Decel Time: 243 msec     Systemic VTI:  0.23 m MV E velocity: 109.00 cm/s  Systemic Diam: 1.90 cm MV A velocity: 85.90 cm/s MV E/A ratio:  1.27 Ida Rogue MD Electronically signed by Ida Rogue MD Signature Date/Time: 09/04/2019/1:50:56 PM    Final    IR IMAGING GUIDED PORT INSERTION  Result Date: 07/20/2019 CLINICAL DATA:  Right breast carcinoma and need for porta cath for chemotherapy. EXAM: IMPLANTED PORT A CATH PLACEMENT WITH ULTRASOUND AND FLUOROSCOPIC GUIDANCE ANESTHESIA/SEDATION: 1.0 mg IV Versed; 50 mcg IV Fentanyl Total Moderate Sedation Time:  28 minutes The patient's level of consciousness and physiologic status were continuously monitored during the procedure by Radiology nursing. Additional Medications: 2 g IV Ancef. FLUOROSCOPY TIME:  30 seconds. PROCEDURE: The procedure, risks, benefits, and alternatives were explained to the patient. Questions  regarding the procedure were encouraged and answered. The patient understands and consents to the procedure. A time-out was performed prior to initiating the procedure. Ultrasound was utilized to confirm patency of the left internal jugular vein. The left neck and chest were prepped with  chlorhexidine in a sterile fashion, and a sterile drape was applied covering the operative field. Maximum barrier sterile technique with sterile gowns and gloves were used for the procedure. Local anesthesia was provided with 1% lidocaine. After creating a small venotomy incision, a 21 gauge needle was advanced into the left internal jugular vein under direct, real-time ultrasound guidance. Ultrasound image documentation was performed. After securing guidewire access, an 8 Fr dilator was placed. A J-wire was kinked to measure appropriate catheter length. A subcutaneous port pocket was then created along the upper chest wall utilizing sharp and blunt dissection. Portable cautery was utilized. The pocket was irrigated with sterile saline. A single lumen power injectable port was chosen for placement. The 8 Fr catheter was tunneled from the port pocket site to the venotomy incision. The port was placed in the pocket. External catheter was trimmed to appropriate length based on guidewire measurement. At the venotomy, an 8 Fr peel-away sheath was placed over a guidewire. The catheter was then placed through the sheath and the sheath removed. Final catheter positioning was confirmed and documented with a fluoroscopic spot image. The port was accessed with a needle and aspirated and flushed with heparinized saline. The access needle was removed. The venotomy and port pocket incisions were closed with subcutaneous 3-0 Monocryl and subcuticular 4-0 Vicryl. Dermabond was applied to both incisions. COMPLICATIONS: COMPLICATIONS None FINDINGS: After catheter placement, the tip lies at the cavo-atrial junction. The catheter aspirates normally and is ready for immediate use. IMPRESSION: Placement of single lumen port a cath via left internal jugular vein. The catheter tip lies at the cavo-atrial junction. A power injectable port a cath was placed and is ready for immediate use. Electronically Signed   By: Aletta Edouard M.D.    On: 07/20/2019 16:28      ASSESSMENT & PLAN:  1. Malignant neoplasm of upper-outer quadrant of right female breast, unspecified estrogen receptor status ([place])   2. Encounter for antineoplastic chemotherapy   3. Antineoplastic chemotherapy induced anemia   4. Hypotension, unspecified hypotension type    cT2N0 grade 3 invasive mammary carcinoma.  ER negative, PR weakly positive (<=10%), HER-2 negative Status post 4 cycles of ddAC with growth factor support. 10/03/2019, interval unilateral right diagnostic mammogram showed right breast mass 11:00 size has decreased to 3.7 x 1.3 x 2.6 cm.  Previously mass measured 2.8 x 2.4 x 2.6 cm Hold Taxol today due to orthostatic hypotension. Patient will proceed with IV fluid normal saline x 1 I suggest patient's hypertension medication be further adjusted.  Patient will contact primary care. Also patient recently had cardiac monitor done, result is pending.  Anemia, normocytic.  Secondary to chemotherapy.  Continue to monitor. Reschedule chemotherapy 1 week All questions were answered. The patient knows to call the clinic with any problems questions or concerns.   Return of visit: 1 week. Earlie Server, MD, PhD Hematology Oncology [facility] Pager- [phone] 10/06/2019

## 2019-10-07 ENCOUNTER — Ambulatory Visit (INDEPENDENT_AMBULATORY_CARE_PROVIDER_SITE_OTHER): Payer: Medicaid Other | Admitting: Adult Health

## 2019-10-07 ENCOUNTER — Encounter: Payer: Self-pay | Admitting: Adult Health

## 2019-10-07 VITALS — Temp 97.3°F | Resp 16 | Ht 61.0 in | Wt 169.0 lb

## 2019-10-07 DIAGNOSIS — E1165 Type 2 diabetes mellitus with hyperglycemia: Secondary | ICD-10-CM

## 2019-10-07 DIAGNOSIS — I10 Essential (primary) hypertension: Secondary | ICD-10-CM | POA: Diagnosis not present

## 2019-10-07 MED ORDER — TRUE METRIX BLOOD GLUCOSE TEST VI STRP: ORAL_STRIP | 3 refills | Status: DC

## 2019-10-07 NOTE — Progress Notes (Signed)
[facility] [place], [place] [address]  Internal MEDICINE  Office Visit Note  Patient Name: Dawn Alvarez  [id number]  [id number]  Date of Service: 10/07/2019  Chief Complaint  Patient presents with  . Acute Visit    low blood pressure     HPI Pt is here for a sick visit. Pt is here complaining of low blood pressure.  She states she went to the cancer center and her orthostatics were as follows.  Laying 86/57, p 97,Sit 81/58, p 105, stand 86/54p 111.  Today her orthostatics are WNL.  She does have an elevated HR.  She states she has been drinking and eating well.   She has been taking Amlodipine 110m daily, hydralazine 560mTID, and Lisinopril 2087maily. At last visit her medications were changed, but she appears to be confused by the new instructions.    Current Medication:  Outpatient Encounter Medications as of 10/07/2019  Medication Sig Note  . Alcohol Swabs (B-D SINGLE USE SWABS REGULAR) PADS Use as directed twice a day   . amLODipine (NORVASC) 5 MG tablet Take one tab po qd for htn   . Blood Glucose Monitoring Suppl (TRUE METRIX AIR GLUCOSE METER) w/Device KIT 1 Device by Does not apply route in the morning and at bedtime. Use ad directed e11.65   . brimonidine (ALPHAGAN) 0.2 % ophthalmic solution Place 1 drop into both eyes 2 (two) times daily. 09/03/2019: Has not started this med yet.  . gMarland Kitchenucose blood (TRUE METRIX BLOOD GLUCOSE TEST) test strip Use as instructed twice a daily diag e11.65   . hydrALAZINE (APRESOLINE) 50 MG tablet Take 50 mg by mouth daily.   . insulin detemir (LEVEMIR FLEXTOUCH) 100 UNIT/ML FlexPen Inject 44 Units into the skin daily.   . Insulin Pen Needle (PEN NEEDLES) 31G X 8 MM MISC Use as directed with insulin E11.65   . Lancets Misc. (ACCU-CHEK MULTICLIX LANCET DEV) KIT by Does not apply route. Use as directed twice a day diag E11.65   . lidocaine-prilocaine (EMLA) cream    . lisinopril (ZESTRIL) 20 MG tablet Take one tab po  qd for HTN   . Omega-3 Fatty Acids (FISH OIL) 1000 MG CAPS Take 1,000 mg by mouth daily.    . prochlorperazine (COMPAZINE) 10 MG tablet Take 1 tablet (10 mg total) by mouth every 6 (six) hours as needed for nausea or vomiting.   . Semaglutide, 1 MG/DOSE, (OZEMPIC, 1 MG/DOSE,) 2 MG/1.5ML SOPN Inject 1 mg into the skin once a week.   . SMarland KitchenMBRINZA 1-0.2 % SUSP    . TRUEplus Lancets 30G MISC Use as directed twice daily diag e11.65    No facility-administered encounter medications on file as of 10/07/2019.      Medical History: Past Medical History:  Diagnosis Date  . Asthma   . [place]ast  . [place]  . Diabetes mellitus without complication ([place] . Hyperlipemia   . Hypertension   . Personal history of chemotherapy      Vital Signs: BP 125/69   Pulse (!) 115   Temp (!) 97.3 F (36.3 C)   Resp 16   Ht '5\' 1"'  (1.549 m)   Wt 169 lb (76.7 kg)   SpO2 98%   BMI 31.93 kg/m    Review of Systems  Constitutional: Negative for chills, fatigue and unexpected weight change.  HENT: Negative for congestion, rhinorrhea, sneezing and sore throat.   Eyes:  Negative for photophobia, pain and redness.  Respiratory: Negative for cough, chest tightness and shortness of breath.   Cardiovascular: Negative for chest pain and palpitations.  Gastrointestinal: Negative for abdominal pain, constipation, diarrhea, nausea and vomiting.  Endocrine: Negative.   Genitourinary: Negative for dysuria and frequency.  Musculoskeletal: Negative for arthralgias, back pain, joint swelling and neck pain.  Skin: Negative for rash.  Allergic/Immunologic: Negative.   Neurological: Negative for tremors and numbness.  Hematological: Negative for adenopathy. Does not bruise/bleed easily.  Psychiatric/Behavioral: Negative for behavioral problems and sleep disturbance. The patient is not nervous/anxious.     Physical Exam Vitals and nursing note reviewed.  Constitutional:       General: She is not in acute distress.    Appearance: She is well-developed. She is not diaphoretic.  HENT:     Head: Normocephalic and atraumatic.     Mouth/Throat:     Pharynx: No oropharyngeal exudate.  Eyes:     Pupils: Pupils are equal, round, and reactive to light.  Neck:     Thyroid: No thyromegaly.     Vascular: No JVD.     Trachea: No tracheal deviation.  Cardiovascular:     Rate and Rhythm: Normal rate and regular rhythm.     Heart sounds: Normal heart sounds. No murmur. No friction rub. No gallop.   Pulmonary:     Effort: Pulmonary effort is normal. No respiratory distress.     Breath sounds: Normal breath sounds. No wheezing or rales.  Chest:     Chest wall: No tenderness.  Abdominal:     Palpations: Abdomen is soft.     Tenderness: There is no abdominal tenderness. There is no guarding.  Musculoskeletal:        General: Normal range of motion.     Cervical back: Normal range of motion and neck supple.  Lymphadenopathy:     Cervical: No cervical adenopathy.  Skin:    General: Skin is warm and dry.  Neurological:     Mental Status: She is alert and oriented to person, place, and time.     Cranial Nerves: No cranial nerve deficit.  Psychiatric:        Behavior: Behavior normal.        Thought Content: Thought content normal.        Judgment: Judgment normal.    Assessment/Plan: 1. Essential hypertension Patient was taking Hydralazine TID which she was suppose to have stopped.  Patient will only take amlodipine, and her lisinopril and hydralazine were taken and placed in our office until she follows up next week.  Pt instructed to go to ER if she feels dizzy, chest pain or blurred vision.   2. Uncontrolled type 2 diabetes mellitus with hyperglycemia (HCC) Refilled test strips, and encouraged patient to continue her DM medications.  - glucose blood (TRUE METRIX BLOOD GLUCOSE TEST) test strip; Use as instructed twice a daily diag e11.65  Dispense: 100 each; Refill:  3   General Counseling: Kenniya verbalizes understanding of the findings of todays visit and agrees with plan of treatment. I have discussed any further diagnostic evaluation that may be needed or ordered today. We also reviewed her medications today. she has been encouraged to call the office with any questions or concerns that should arise related to todays visit.   No orders of the defined types were placed in this encounter.   No orders of the defined types were placed in this encounter.   Time spent:25 Minutes  This patient  was seen by Orson Gear AGNP-C in Collaboration with Dr Lavera Guise as a part of collaborative care agreement.  Kendell Bane AGNP-C Internal Medicine

## 2019-10-11 ENCOUNTER — Inpatient Hospital Stay: Payer: Medicare HMO

## 2019-10-11 ENCOUNTER — Encounter: Payer: Self-pay | Admitting: Oncology

## 2019-10-11 ENCOUNTER — Encounter: Payer: Self-pay | Admitting: *Deleted

## 2019-10-11 ENCOUNTER — Other Ambulatory Visit: Payer: Self-pay

## 2019-10-11 ENCOUNTER — Inpatient Hospital Stay (HOSPITAL_BASED_OUTPATIENT_CLINIC_OR_DEPARTMENT_OTHER): Payer: Medicare HMO | Admitting: Oncology

## 2019-10-11 VITALS — BP 134/85 | HR 84 | Resp 19

## 2019-10-11 VITALS — BP 101/67 | HR 88 | Temp 95.3°F | Resp 18 | Wt 164.2 lb

## 2019-10-11 DIAGNOSIS — Z5111 Encounter for antineoplastic chemotherapy: Secondary | ICD-10-CM | POA: Diagnosis not present

## 2019-10-11 DIAGNOSIS — C50411 Malignant neoplasm of upper-outer quadrant of right female breast: Secondary | ICD-10-CM

## 2019-10-11 DIAGNOSIS — T451X5A Adverse effect of antineoplastic and immunosuppressive drugs, initial encounter: Secondary | ICD-10-CM | POA: Diagnosis not present

## 2019-10-11 DIAGNOSIS — Z006 Encounter for examination for normal comparison and control in clinical research program: Secondary | ICD-10-CM

## 2019-10-11 DIAGNOSIS — Z87891 Personal history of nicotine dependence: Secondary | ICD-10-CM | POA: Diagnosis not present

## 2019-10-11 DIAGNOSIS — Z79899 Other long term (current) drug therapy: Secondary | ICD-10-CM

## 2019-10-11 DIAGNOSIS — Z171 Estrogen receptor negative status [ER-]: Secondary | ICD-10-CM

## 2019-10-11 DIAGNOSIS — Z95828 Presence of other vascular implants and grafts: Secondary | ICD-10-CM

## 2019-10-11 DIAGNOSIS — I951 Orthostatic hypotension: Secondary | ICD-10-CM | POA: Diagnosis not present

## 2019-10-11 DIAGNOSIS — D6481 Anemia due to antineoplastic chemotherapy: Secondary | ICD-10-CM

## 2019-10-11 LAB — COMPREHENSIVE METABOLIC PANEL
ALT: 16 U/L (ref 0–44)
AST: 15 U/L (ref 15–41)
Albumin: 3.5 g/dL (ref 3.5–5.0)
Alkaline Phosphatase: 38 U/L (ref 38–126)
Anion gap: 8 (ref 5–15)
BUN: 7 mg/dL — ABNORMAL LOW (ref 8–23)
CO2: 24 mmol/L (ref 22–32)
Calcium: 8.7 mg/dL — ABNORMAL LOW (ref 8.9–10.3)
Chloride: 104 mmol/L (ref 98–111)
Creatinine, Ser: 0.8 mg/dL (ref 0.44–1.00)
GFR calc Af Amer: >60 mL/min (ref >60)
GFR calc non Af Amer: >60 mL/min (ref >60)
Glucose, Bld: 131 mg/dL — ABNORMAL HIGH (ref 70–99)
Potassium: 3.7 mmol/L (ref 3.5–5.1)
Sodium: 136 mmol/L (ref 135–145)
Total Bilirubin: 0.5 mg/dL (ref 0.3–1.2)
Total Protein: 6.7 g/dL (ref 6.5–8.1)

## 2019-10-11 LAB — CBC WITH DIFFERENTIAL/PLATELET
Abs Immature Granulocytes: 0.04 10*3/uL (ref 0.00–0.07)
Basophils Absolute: 0.1 10*3/uL (ref 0.0–0.1)
Basophils Relative: 1 %
Eosinophils Absolute: 0.2 10*3/uL (ref 0.0–0.5)
Eosinophils Relative: 3 %
HCT: 26.8 % — ABNORMAL LOW (ref 36.0–46.0)
Hemoglobin: 9.1 g/dL — ABNORMAL LOW (ref 12.0–15.0)
Immature Granulocytes: 1 %
Lymphocytes Relative: 18 %
Lymphs Abs: 1.3 10*3/uL (ref 0.7–4.0)
MCH: 29.2 pg (ref 26.0–34.0)
MCHC: 34 g/dL (ref 30.0–36.0)
MCV: 85.9 fL (ref 80.0–100.0)
Monocytes Absolute: 1 10*3/uL (ref 0.1–1.0)
Monocytes Relative: 14 %
Neutro Abs: 4.5 10*3/uL (ref 1.7–7.7)
Neutrophils Relative %: 63 %
Platelets: 304 10*3/uL (ref 150–400)
RBC: 3.12 MIL/uL — ABNORMAL LOW (ref 3.87–5.11)
RDW: 17.9 % — ABNORMAL HIGH (ref 11.5–15.5)
WBC: 7.1 10*3/uL (ref 4.0–10.5)
nRBC: 0 % (ref 0.0–0.2)

## 2019-10-11 MED ORDER — DIPHENHYDRAMINE HCL 50 MG/ML IJ SOLN
50.0000 mg | Freq: Once | INTRAMUSCULAR | Status: AC
  Administered 2019-10-11: 50 mg via INTRAVENOUS
  Filled 2019-10-11: qty 1

## 2019-10-11 MED ORDER — HEPARIN SOD (PORK) LOCK FLUSH 100 UNIT/ML IV SOLN
500.0000 [IU] | Freq: Once | INTRAVENOUS | Status: AC | PRN
  Administered 2019-10-11: 500 [IU]
  Filled 2019-10-11: qty 5

## 2019-10-11 MED ORDER — HEPARIN SOD (PORK) LOCK FLUSH 100 UNIT/ML IV SOLN
INTRAVENOUS | Status: AC
  Filled 2019-10-11: qty 5

## 2019-10-11 MED ORDER — SODIUM CHLORIDE 0.9 % IV SOLN
Freq: Once | INTRAVENOUS | Status: AC
  Filled 2019-10-11: qty 250

## 2019-10-11 MED ORDER — FAMOTIDINE IN NACL 20-0.9 MG/50ML-% IV SOLN
20.0000 mg | Freq: Once | INTRAVENOUS | Status: AC
  Administered 2019-10-11: 20 mg via INTRAVENOUS
  Filled 2019-10-11: qty 50

## 2019-10-11 MED ORDER — SODIUM CHLORIDE 0.9 % IV SOLN
80.0000 mg/m2 | Freq: Once | INTRAVENOUS | Status: AC
  Administered 2019-10-11: 150 mg via INTRAVENOUS
  Filled 2019-10-11: qty 25

## 2019-10-11 MED ORDER — SODIUM CHLORIDE 0.9 % IV SOLN
10.0000 mg | Freq: Once | INTRAVENOUS | Status: AC
  Administered 2019-10-11: 10 mg via INTRAVENOUS
  Filled 2019-10-11: qty 10

## 2019-10-11 MED ORDER — SODIUM CHLORIDE 0.9% FLUSH
10.0000 mL | INTRAVENOUS | Status: DC | PRN
  Administered 2019-10-11: 10 mL via INTRAVENOUS
  Filled 2019-10-11: qty 10

## 2019-10-11 NOTE — Progress Notes (Signed)
Only medication patient takes is Amlodipine, Ozempic, Levemir and Compazine PRN as advised by her PCP.

## 2019-10-11 NOTE — Progress Notes (Signed)
Hematology/Oncology follow up note [facility] Telephone:([phone]) [phone] Fax:(336) [phone]   Patient Care Team: Lavera Guise, MD as PCP - General (Internal Medicine) Kate Sable, MD as PCP - Cardiology (Cardiology) Rico Junker, RN as Registered Nurse Theodore Demark, RN as Registered Nurse  REFERRING PROVIDER: Lavera Guise, MD  CHIEF COMPLAINTS/REASON FOR VISIT:  Follow-up for breast cancer  HISTORY OF PRESENTING ILLNESS:   Dawn Alvarez is a  64 y.o.  female with PMH listed below was seen in consultation at the request of  Lavera Guise, MD  for evaluation of breast cancer Patient had screening mammogram done 08/12/2017 which showed right breast asymmetry.  A diagnostic mammogram was suggested and patient did not have it done. Patient felt her right breast mass for a few weeks.  She had bilateral diagnostic mammogram done on 06/30/2019. 2.8 x 2.4 x 2.6 cm right breast mass, 11:00, 10 cm from the nipple.  There is a single mildly abnormal node in the right axilla with a cortex measuring up to 4.4 mm.  No other suspicious findings. Patient underwent ultrasound-guided core biopsy of the right breast mass and right axilla lymph node Pathology showed invasive mammary carcinoma, no special type, grade 3, ER/PR HER-2 status are pending. Right axillary lymph node biopsy showed predominantly blood in the fibroadipose tissue, with scant lymphoid tissue present.  No definite malignancy was identified.  Patient was referred to cancer center to establish care and discuss treatment plan. Menarche 42 Postmenopausal.  LMP when she was 64 years old. She recalls use of birth control pills. Denies any hormone .  Replacement therapy. Denies any prior chest radiation. She reports family history of maternal grandmother and 2 maternal cousins were diagnosed with cancer.  She does not know about details. # 09/03/2019, patient had an episode of syncope and EMS was called  and patient sent to emergency room.  Patient was noted to have a heart rate 45-65 and blood glucose level of 286. Patient denies any history of nausea, vomiting, diarrhea.  Denies any dehydration.  She reported history of intermittent abdominal pain. CT head without contrast showed no acute intracranial abnormality.  CT abdomen pelvis with contrast is negative for evidence of acute abdominal abnormality.  Patient was given IV fluid. Patient was observed with holding her blood pressure medication including amlodipine, bisoprolol.  No arrhythmia was noted on telemetry.  Echocardiogram showed LVEF 60 to 65%.  No valvular abnormalities.  Beta-blocker and diuretics was discontinued.  # Patient was seen by cardiology Dr. Garen Lah for evaluation of syncope and was cleared for chemotherapy.  [name] is a 64 y.o. female who has above history reviewed by me today presents for follow up visit for management of breast cancer, evaluation prior to chemotherapy. Problems and complaints are listed below: Patient was seen by her PCP for hypotension. It appeared that she was taking hydralazine which she was supposed to stop.  Her hypertension regimen has been adjusted.  Hydralazine and lisinopril have both been discontinued.  Patient is only on amlodipine.  Patient reports that she is feeling better now.  No additional dizziness episodes.  Patient was here alone by herself.  She is participating in our neuropathy trial and has a discussion with research nurse.  Review of Systems  Constitutional: Negative for appetite change, chills, fatigue and fever.  HENT:   Negative for hearing loss and voice change.   Eyes: Negative for eye problems.  Respiratory: Negative for chest  tightness and cough.   Cardiovascular: Negative for chest pain.  Gastrointestinal: Negative for abdominal distention, abdominal pain and blood in stool.  Endocrine: Negative for hot flashes.  Genitourinary: Negative  for difficulty urinating and frequency.   Musculoskeletal: Negative for arthralgias.  Skin: Negative for itching and rash.  Neurological: Negative for extremity weakness.  Hematological: Negative for adenopathy.  Psychiatric/Behavioral: Negative for confusion.    MEDICAL HISTORY:  Past Medical History:  Diagnosis Date  . Asthma   . Cancer ([place])    breast  . COPD exacerbation ([place]) 04/12/2016  . Diabetes mellitus without complication ([place])   . Hyperlipemia   . Hypertension   . Personal history of chemotherapy     SURGICAL HISTORY: Past Surgical History:  Procedure Laterality Date  . BREAST BIOPSY Right 07/05/2019   [place] bx venus marker, grade 3 invasive mammary carcinoma  . BREAST BIOPSY Right 07/05/2019   LN bx, hydromarker,PREDOMINANTLY BLOOD AND FIBROADIPOSE TISSUE, WITH SCANT LYMPHOID TISSUE PRESENT      . CESAREAN SECTION    . COLONOSCOPY WITH PROPOFOL N/A 09/11/2015   Procedure: COLONOSCOPY WITH PROPOFOL;  Surgeon: Lucilla Lame, MD;  Location: [facility] ENDOSCOPY;  Service: Endoscopy;  Laterality: N/A;  . IR IMAGING GUIDED PORT INSERTION  07/20/2019    SOCIAL HISTORY: Social History   Socioeconomic History  . Marital status: Married    Spouse name: Not on file  . Number of children: Not on file  . Years of education: Not on file  . Highest education level: Not on file  Occupational History  . Not on file  Tobacco Use  . Smoking status: Former Smoker    Packs/day: 1.00    Years: 2.00    Pack years: 2.00    Types: Cigarettes    Quit date: 05/19/2019    Years since quitting: 0.3  . Smokeless tobacco: Never Used  Substance and Sexual Activity  . Alcohol use: Not Currently  . Drug use: No  . Sexual activity: Never    Birth control/protection: Abstinence  Other Topics Concern  . Not on file  Social History Narrative  . Not on file   Social Determinants of Health   Financial Resource Strain:   . Difficulty of Paying Living Expenses:   Food Insecurity:   .  Worried About Charity fundraiser in the Last Year:   . Arboriculturist in the Last Year:   Transportation Needs:   . Film/video editor (Medical):   Marland Kitchen Lack of Transportation (Non-Medical):   Physical Activity:   . Days of Exercise per Week:   . Minutes of Exercise per Session:   Stress:   . Feeling of Stress :   Social Connections:   . Frequency of Communication with Friends and Family:   . Frequency of Social Gatherings with Friends and Family:   . Attends Religious Services:   . Active Member of Clubs or Organizations:   . Attends Archivist Meetings:   Marland Kitchen Marital Status:   Intimate Partner Violence:   . Fear of Current or Ex-Partner:   . Emotionally Abused:   Marland Kitchen Physically Abused:   . Sexually Abused:     FAMILY HISTORY: Family History  Problem Relation Age of Onset  . Diabetes Mother   . Hypertension Mother   . Hypertension Father   . Diabetes Father     ALLERGIES:  has No Known Allergies.  MEDICATIONS:  Current Outpatient Medications  Medication Sig Dispense Refill  . Alcohol Swabs (  B-D SINGLE USE SWABS REGULAR) PADS Use as directed twice a day 300 each 3  . amLODipine (NORVASC) 5 MG tablet Take one tab po qd for htn 30 tablet 3  . Blood Glucose Monitoring Suppl (TRUE METRIX AIR GLUCOSE METER) w/Device KIT 1 Device by Does not apply route in the morning and at bedtime. Use ad directed e11.65 1 kit 0  . brimonidine (ALPHAGAN) 0.2 % ophthalmic solution Place 1 drop into both eyes 2 (two) times daily.    Marland Kitchen glucose blood (TRUE METRIX BLOOD GLUCOSE TEST) test strip Use as instructed twice a daily diag e11.65 100 each 3  . insulin detemir (LEVEMIR FLEXTOUCH) 100 UNIT/ML FlexPen Inject 44 Units into the skin daily. 15 mL 3  . Insulin Pen Needle (PEN NEEDLES) 31G X 8 MM MISC Use as directed with insulin E11.65 100 each 1  . Lancets Misc. (ACCU-CHEK MULTICLIX LANCET DEV) KIT by Does not apply route. Use as directed twice a day diag E11.65    .  lidocaine-prilocaine (EMLA) cream     . Omega-3 Fatty Acids (FISH OIL) 1000 MG CAPS Take 1,000 mg by mouth daily.     . prochlorperazine (COMPAZINE) 10 MG tablet Take 1 tablet (10 mg total) by mouth every 6 (six) hours as needed for nausea or vomiting. 60 tablet 1  . Semaglutide, 1 MG/DOSE, (OZEMPIC, 1 MG/DOSE,) 2 MG/1.5ML SOPN Inject 1 mg into the skin once a week. 5 pen 3  . TRUEplus Lancets 30G MISC Use as directed twice daily diag e11.65 100 each 3  . hydrALAZINE (APRESOLINE) 50 MG tablet Take 50 mg by mouth daily.    Marland Kitchen lisinopril (ZESTRIL) 20 MG tablet Take one tab po qd for HTN (Patient not taking: Reported on 10/11/2019) 30 tablet 3  . SIMBRINZA 1-0.2 % SUSP      No current facility-administered medications for this visit.   Facility-Administered Medications Ordered in Other Visits  Medication Dose Route Frequency Provider Last Rate Last Admin  . sodium chloride flush (NS) 0.9 % injection 10 mL  10 mL Intravenous PRN Earlie Server, MD   10 mL at 10/11/19 0817     PHYSICAL EXAMINATION: ECOG PERFORMANCE STATUS: 1 - Symptomatic but completely ambulatory Vitals:   10/11/19 0839  BP: 101/67  Pulse: 88  Resp: 18  Temp: (!) 95.3 F (35.2 C)   Filed Weights   10/11/19 0839  Weight: 164 lb 3.2 oz (74.5 kg)    Physical Exam Constitutional:      General: She is not in acute distress. HENT:     Head: Normocephalic and atraumatic.  Eyes:     General: No scleral icterus. Cardiovascular:     Rate and Rhythm: Normal rate and regular rhythm.     Heart sounds: Normal heart sounds.  Pulmonary:     Effort: Pulmonary effort is normal. No respiratory distress.     Breath sounds: No wheezing.  Abdominal:     General: Bowel sounds are normal. There is no distension.     Palpations: Abdomen is soft.  Musculoskeletal:        General: No deformity. Normal range of motion.     Cervical back: Normal range of motion and neck supple.  Skin:    General: Skin is warm and dry.     Findings: No  erythema or rash.  Neurological:     Mental Status: She is alert and oriented to person, place, and time. Mental status is at baseline.     Cranial  Nerves: No cranial nerve deficit.     Coordination: Coordination normal.  Psychiatric:        Mood and Affect: Mood normal.      LABORATORY DATA:  I have reviewed the data as listed Lab Results  Component Value Date   WBC 7.1 10/11/2019   HGB 9.1 (L) 10/11/2019   HCT 26.8 (L) 10/11/2019   MCV 85.9 10/11/2019   PLT 304 10/11/2019   Recent Labs    09/21/19 1120 10/06/19 0807 10/11/19 0812  NA 139 138 136  K 3.7 3.7 3.7  CL 105 104 104  CO2 '23 23 24  ' GLUCOSE 110* 152* 131*  BUN 15 10 7*  CREATININE 0.92 0.99 0.80  CALCIUM 9.2 9.1 8.7*  GFRNONAA >60 >60 >60  GFRAA >60 >60 >60  PROT 7.2 7.1 6.7  ALBUMIN 3.9 3.9 3.5  AST '19 24 15  ' ALT '15 20 16  ' ALKPHOS 42 47 38  BILITOT 0.5 0.5 0.5   Iron/TIBC/Ferritin/ %Sat No results found for: IRON, TIBC, FERRITIN, IRONPCTSAT    RADIOGRAPHIC STUDIES: I have personally reviewed the radiological images as listed and agreed with the findings in the report. CT Head Wo Contrast  Result Date: 09/03/2019 CLINICAL DATA:  Encephalopathy. EXAM: CT HEAD WITHOUT CONTRAST TECHNIQUE: Contiguous axial images were obtained from the base of the skull through the vertex without intravenous contrast. COMPARISON:  Head CT 07/30/2019 FINDINGS: Brain: Brain volume is normal for age. No intracranial hemorrhage, mass effect, or midline shift. No hydrocephalus. The basilar cisterns are patent. Unchanged periventricular and deep chronic small vessel ischemia. Punctate lacunar infarct in the right thalamus. No evidence of territorial infarct or acute ischemia. No extra-axial or intracranial fluid collection. Vascular: No hyperdense vessel. Skull: Normal. Negative for fracture or focal lesion. Sinuses/Orbits: Paranasal sinuses and mastoid air cells are clear. The visualized orbits are unremarkable. Other: None.  IMPRESSION: 1. No acute intracranial abnormality. 2. Stable chronic small vessel ischemia from prior. Electronically Signed   By: Keith Rake M.D.   On: 09/03/2019 19:53   CT Head Wo Contrast  Result Date: 07/30/2019 CLINICAL DATA:  64 year old female with altered mental status EXAM: CT HEAD WITHOUT CONTRAST TECHNIQUE: Contiguous axial images were obtained from the base of the skull through the vertex without intravenous contrast. COMPARISON:  MR 05/04/2010, CT 12/28/2018 FINDINGS: Brain: No acute intracranial hemorrhage. No midline shift or mass effect. Gray-white differentiation maintained. Confluent hypodensity in the periventricular white matter, similar to the prior MR and CT. Unremarkable appearance of the ventricular system. Vascular: Mild atherosclerotic calcifications. Skull: No acute fracture.  No aggressive bone lesion identified. Sinuses/Orbits: Unremarkable appearance of the orbits. Mastoid air cells clear. No middle ear effusion. No significant sinus disease. Other: None IMPRESSION: Negative for acute intracranial abnormality. Chronic microvascular ischemic disease. Electronically Signed   By: Corrie Mckusick D.O.   On: 07/30/2019 14:46   NM Cardiac Muga Rest  Result Date: 07/25/2019 CLINICAL DATA:  Breast cancer. Evaluate cardiac function in relation to chemotherapy. EXAM: NUCLEAR MEDICINE CARDIAC BLOOD POOL IMAGING (MUGA) TECHNIQUE: Cardiac multi-gated acquisition was performed at rest following intravenous injection of Tc-66mlabeled red blood cells. RADIOPHARMACEUTICALS:  22.8 mCi Tc-928mertechnetate in-vitro labeled red blood cells IV COMPARISON:  None FINDINGS: No  focal wall motion abnormality of the left ventricle. Calculated left ventricular ejection fraction equals 74 % IMPRESSION: Left ventricular ejection fraction equals74 %. Electronically Signed   By: StSuzy Bouchard.D.   On: 07/25/2019 15:16   CT ABDOMEN PELVIS W CONTRAST  Result Date: 09/03/2019 CLINICAL DATA:  Nausea  vomiting. EXAM: CT ABDOMEN AND PELVIS WITH CONTRAST TECHNIQUE: Multidetector CT imaging of the abdomen and pelvis was performed using the standard protocol following bolus administration of intravenous contrast. CONTRAST:  142m OMNIPAQUE IOHEXOL 300 MG/ML  SOLN COMPARISON:  April 20, 2016 FINDINGS: Lower chest: No acute abnormality. Hepatobiliary: No focal liver abnormality is seen. No gallstones, gallbladder wall thickening, or biliary dilatation. Pancreas: Unremarkable. No pancreatic ductal dilatation or surrounding inflammatory changes. Spleen: Normal in size without focal abnormality. Adrenals/Urinary Tract: Adrenal glands are unremarkable. Kidneys are normal, without renal calculi, solid lesion, or hydronephrosis. 2 cm right renal cyst. Bladder is unremarkable. Stomach/Bowel: Stomach is within normal limits. Appendix appears normal. No evidence of bowel wall thickening, distention, or inflammatory changes. Vascular/Lymphatic: Aortic atherosclerosis. No enlarged abdominal or pelvic lymph nodes. Reproductive: Numerous some calcified myometrial masses. Other: No abdominal wall hernia or abnormality. No abdominopelvic ascites. Musculoskeletal: L4-L5 spondylosis. IMPRESSION: 1. No evidence of acute abnormalities within the solid abdominal organs. 2. Myometrial fibroids. Aortic Atherosclerosis (ICD10-I70.0). Electronically Signed   By: DFidela SalisburyM.D.   On: 09/03/2019 19:54   [place]ast Limited Uni Right Inc Axilla  Result Date: 10/03/2019 CLINICAL DATA:  Ultrasound-guided core biopsy of mass in the RIGHT breast 10-11 o'clock location performed 07/05/2019, showing grade 3 invasive mammary carcinoma. Ultrasound-guided core biopsy of RIGHT axillary lymph node was benign and concordant. Patient has undergone neoadjuvant treatment scans feels the mass has gotten smaller. Assess for response to treatment. EXAM: DIGITAL DIAGNOSTIC RIGHT MAMMOGRAM WITH CAD AND TOMO ULTRASOUND RIGHT BREAST COMPARISON:   07/05/2019 and earlier ACR Breast Density Category b: There are scattered areas of fibroglandular density. FINDINGS: Within the UPPER-OUTER QUADRANT of the RIGHT breast there is a spiculated mass, marked with a tissue marking clip. Lesion appears smaller compared to prior study. No new suspicious findings in the RIGHT breast. Mammographic images were processed with CAD. Targeted ultrasound is performed, showingirregular hypoechoic mass in the 11 o'clock location of the RIGHT breast 10 centimeters from the nipple, measuring 2.7 x 1.3 x 2.6 centimeters. Previously mass measured 2.8 x 2.4 x 2.6 centimeters. IMPRESSION: Smaller mass in the 11 o'clock location of the RIGHT breast. RECOMMENDATION: Treatment plan for known RIGHT breast cancer. I have discussed the findings and recommendations with the patient. If applicable, a reminder letter will be sent to the patient regarding the next appointment. BI-RADS CATEGORY  6: Known biopsy-proven malignancy. Electronically Signed   By: ENolon NationsM.D.   On: 10/03/2019 14:36   MM DIAG BREAST TOMO UNI RIGHT  Result Date: 10/03/2019 CLINICAL DATA:  Ultrasound-guided core biopsy of mass in the RIGHT breast 10-11 o'clock location performed 07/05/2019, showing grade 3 invasive mammary carcinoma. Ultrasound-guided core biopsy of RIGHT axillary lymph node was benign and concordant. Patient has undergone neoadjuvant treatment scans feels the mass has gotten smaller. Assess for response to treatment. EXAM: DIGITAL DIAGNOSTIC RIGHT MAMMOGRAM WITH CAD AND TOMO ULTRASOUND RIGHT BREAST COMPARISON:  07/05/2019 and earlier ACR Breast Density Category b: There are scattered areas of fibroglandular density. FINDINGS: Within the UPPER-OUTER QUADRANT of the RIGHT breast there is a spiculated mass, marked with a tissue marking clip. Lesion appears smaller compared to prior study. No new suspicious findings in the RIGHT breast. Mammographic images were processed with CAD. Targeted  ultrasound is performed, showingirregular hypoechoic mass in the 11 o'clock location of the RIGHT breast 10 centimeters from the nipple, measuring 2.7 x 1.3 x 2.6 centimeters. Previously mass measured 2.8 x  2.4 x 2.6 centimeters. IMPRESSION: Smaller mass in the 11 o'clock location of the RIGHT breast. RECOMMENDATION: Treatment plan for known RIGHT breast cancer. I have discussed the findings and recommendations with the patient. If applicable, a reminder letter will be sent to the patient regarding the next appointment. BI-RADS CATEGORY  6: Known biopsy-proven malignancy. Electronically Signed   By: Nolon Nations M.D.   On: 10/03/2019 14:36   ECHOCARDIOGRAM COMPLETE  Result Date: 09/04/2019    ECHOCARDIOGRAM REPORT   Patient Name:   JAMEYA PONTIFF Date of Exam: 09/04/2019 Medical Rec #:  [id number]        Height:       61.0 in Accession #:    [id number]       Weight:       169.2 lb Date of Birth:  1955-07-17        BSA:          1.759 m Patient Age:    89 years         BP:           175/83 mmHg Patient Gender: F                HR:           67 bpm. Exam Location:  [facility] Procedure: 2D Echo Indications:     Syncope 780.2/ R55  History:         Patient has no prior history of Echocardiogram examinations.  Sonographer:     Arville Go RDCS Referring Phys:  [place] Diagnosing Phys: Ida Rogue MD IMPRESSIONS  1. Left ventricular ejection fraction, by estimation, is 60 to 65%. The left ventricle has normal function. The left ventricle has no regional wall motion abnormalities. There is mild left ventricular hypertrophy. Left ventricular diastolic parameters were normal.  2. Right ventricular systolic function is normal. The right ventricular size is normal. Tricuspid regurgitation signal is inadequate for assessing PA pressure. FINDINGS  Left Ventricle: Left ventricular ejection fraction, by estimation, is 60 to 65%. The left ventricle has normal function. The left ventricle has no regional wall  motion abnormalities. The left ventricular internal cavity size was normal in size. There is  mild left ventricular hypertrophy. Left ventricular diastolic parameters were normal. Right Ventricle: The right ventricular size is normal. No increase in right ventricular wall thickness. Right ventricular systolic function is normal. Tricuspid regurgitation signal is inadequate for assessing PA pressure. Left Atrium: Left atrial size was normal in size. Right Atrium: Right atrial size was normal in size. Pericardium: There is no evidence of pericardial effusion. Mitral Valve: The mitral valve is normal in structure. Normal mobility of the mitral valve leaflets. Trivial mitral valve regurgitation. No evidence of mitral valve stenosis. Tricuspid Valve: The tricuspid valve is normal in structure. Tricuspid valve regurgitation is not demonstrated. No evidence of tricuspid stenosis. Aortic Valve: The aortic valve is normal in structure. Aortic valve regurgitation is not visualized. No aortic stenosis is present. Aortic valve mean gradient measures 7.0 mmHg. Aortic valve peak gradient measures 13.5 mmHg. Aortic valve area, by VTI measures 1.48 cm. Pulmonic Valve: The pulmonic valve was normal in structure. Pulmonic valve regurgitation is not visualized. No evidence of pulmonic stenosis. Aorta: The aortic root is normal in size and structure. Venous: The inferior vena cava is normal in size with greater than 50% respiratory variability, suggesting right atrial pressure of 3 mmHg. IAS/Shunts: No atrial level shunt detected by color flow Doppler.  LEFT VENTRICLE  PLAX 2D LVIDd:         4.11 cm  Diastology LVIDs:         2.51 cm  LV e' lateral:   9.25 cm/s LV PW:         1.35 cm  LV E/e' lateral: 11.8 LV IVS:        1.17 cm  LV e' medial:    7.51 cm/s LVOT diam:     1.90 cm  LV E/e' medial:  14.5 LV SV:         65 LV SV Index:   37 LVOT Area:     2.84 cm  RIGHT VENTRICLE RV Basal diam:  2.39 cm RV S prime:     19.00 cm/s TAPSE  (M-mode): 2.1 cm LEFT ATRIUM             Index       RIGHT ATRIUM           Index LA diam:        3.30 cm 1.88 cm/m  RA Area:     10.60 cm LA Vol (A2C):   41.0 ml 23.31 ml/m RA Volume:   22.90 ml  13.02 ml/m LA Vol (A4C):   40.2 ml 22.85 ml/m LA Biplane Vol: 42.6 ml 24.22 ml/m  AORTIC VALVE                    PULMONIC VALVE AV Area (Vmax):    1.59 cm     PV Vmax:       0.84 m/s AV Area (Vmean):   1.59 cm     PV Peak grad:  2.8 mmHg AV Area (VTI):     1.48 cm AV Vmax:           184.00 cm/s AV Vmean:          117.000 cm/s AV VTI:            0.441 m AV Peak Grad:      13.5 mmHg AV Mean Grad:      7.0 mmHg LVOT Vmax:         103.00 cm/s LVOT Vmean:        65.500 cm/s LVOT VTI:          0.230 m LVOT/AV VTI ratio: 0.52  AORTA Ao Root diam: 2.60 cm MITRAL VALVE MV Area (PHT): 3.12 cm     SHUNTS MV Decel Time: 243 msec     Systemic VTI:  0.23 m MV E velocity: 109.00 cm/s  Systemic Diam: 1.90 cm MV A velocity: 85.90 cm/s MV E/A ratio:  1.27 Ida Rogue MD Electronically signed by Ida Rogue MD Signature Date/Time: 09/04/2019/1:50:56 PM    Final    IR IMAGING GUIDED PORT INSERTION  Result Date: 07/20/2019 CLINICAL DATA:  Right breast carcinoma and need for porta cath for chemotherapy. EXAM: IMPLANTED PORT A CATH PLACEMENT WITH ULTRASOUND AND FLUOROSCOPIC GUIDANCE ANESTHESIA/SEDATION: 1.0 mg IV Versed; 50 mcg IV Fentanyl Total Moderate Sedation Time:  28 minutes The patient's level of consciousness and physiologic status were continuously monitored during the procedure by Radiology nursing. Additional Medications: 2 g IV Ancef. FLUOROSCOPY TIME:  30 seconds. PROCEDURE: The procedure, risks, benefits, and alternatives were explained to the patient. Questions regarding the procedure were encouraged and answered. The patient understands and consents to the procedure. A time-out was performed prior to initiating the procedure. Ultrasound was utilized to confirm patency of the left internal jugular vein. The left  neck  and chest were prepped with chlorhexidine in a sterile fashion, and a sterile drape was applied covering the operative field. Maximum barrier sterile technique with sterile gowns and gloves were used for the procedure. Local anesthesia was provided with 1% lidocaine. After creating a small venotomy incision, a 21 gauge needle was advanced into the left internal jugular vein under direct, real-time ultrasound guidance. Ultrasound image documentation was performed. After securing guidewire access, an 8 Fr dilator was placed. A J-wire was kinked to measure appropriate catheter length. A subcutaneous port pocket was then created along the upper chest wall utilizing sharp and blunt dissection. Portable cautery was utilized. The pocket was irrigated with sterile saline. A single lumen power injectable port was chosen for placement. The 8 Fr catheter was tunneled from the port pocket site to the venotomy incision. The port was placed in the pocket. External catheter was trimmed to appropriate length based on guidewire measurement. At the venotomy, an 8 Fr peel-away sheath was placed over a guidewire. The catheter was then placed through the sheath and the sheath removed. Final catheter positioning was confirmed and documented with a fluoroscopic spot image. The port was accessed with a needle and aspirated and flushed with heparinized saline. The access needle was removed. The venotomy and port pocket incisions were closed with subcutaneous 3-0 Monocryl and subcuticular 4-0 Vicryl. Dermabond was applied to both incisions. COMPLICATIONS: COMPLICATIONS None FINDINGS: After catheter placement, the tip lies at the cavo-atrial junction. The catheter aspirates normally and is ready for immediate use. IMPRESSION: Placement of single lumen port a cath via left internal jugular vein. The catheter tip lies at the cavo-atrial junction. A power injectable port a cath was placed and is ready for immediate use. Electronically  Signed   By: Aletta Edouard M.D.   On: 07/20/2019 16:28      ASSESSMENT & PLAN:  1. Malignant neoplasm of upper-outer quadrant of right female breast, unspecified estrogen receptor status ([place])   2. Encounter for antineoplastic chemotherapy   3. Antineoplastic chemotherapy induced anemia    cT2N0 grade 3 invasive mammary carcinoma.  ER negative, PR weakly positive (<=10%), HER-2 negative Status post 4 cycles of ddAC with growth factor support. 10/03/2019, interval unilateral right diagnostic mammogram showed right breast mass 11:00 size has decreased to 3.7 x 1.3 x 2.6 cm.  Previously mass measured 2.8 x 2.4 x 2.6 cm Labs reviewed and discussed with patient. Counts acceptable to proceed with cycle 1 Taxol today.  Chemotherapy-induced anemia, hemoglobin 9.1.  Continue to monitor.  Dizziness, likely due to hypotension episodes secondary to antihypertensive medication.  Now blood pressure medication has been reduced to only amlodipine.  Patient's vital signs are improved compared to last visit.  No longer orthostatic.  Patient also clinically feels better.Patient's daughter and plan was also discussed with her.  Patient will continue follow-up with cardiology and primary care provider.  All questions were answered. The patient knows to call the clinic with any problems questions or concerns.   Return of visit: 1 week. Earlie Server, MD, PhD Hematology Oncology [facility] at [facility] Pager- [phone] 10/11/2019

## 2019-10-11 NOTE — Research (Signed)
Patient Dawn Alvarez returns to clinic alone this morning for consideration of initiating Taxol chemotherapy and the SWOG [id number] research study. Patient reports she feels much better today and states her doctor took her off of some of her blood pressure medicine. B/P is 101/67, which is just a little better than last week; however patient states she is not feeling as weak and tired as she was a week ago. She denies experiencing any falls within the past 6 months, and verified that she is left-side dominant. Dr. Tasia Catchings performed H&P and has completed the On-study Treatment Burden question and Baseline Physician CTCAE (neuropathy) Assessment. Timed Get up and Go, Falls assessment, Neuropen assessments and [place] assessment was completed by Yolande Jolly, RN. Patient also completed the PROMIS-29, GSLTPAQ, EORTC QLQ-CIPN20, PRO-CTCAE, the Baseline Patient Reported Symptom Burden Questionnaire and the FACT/GOG-NTX while in exam room and prior to the initiation of Taxol. Since the patient was alone, at her preference, all study-related questionnaires were read out loud and patient responses were recorded by this RN. Patient smoking history was verified while in clinic since MD note reported she had only smoked for 2 years. Patient states she actually smoked for about 35 years, and smoked about 1 pack per day during most of that time. She does verify that she stopped smoking on May 19, 2019. She had pre-study required Central labs collected this morning and was reminded that there is a one-time peripheral lab due today that will be collected while she is receiving the first Taxol infusion about 10 minutes before the end of the infusion. Collaboration for this was made with the patient's infusion nurse Alanson Puls, RN. After baseline procedures were completed, the patient was registered for the study in OPEN.   Adverse Event Log  Study/Protocol: SWOG [id number] Cycle: Baseline solicited AEs Event Grade Onset  Date Resolved Date Drug Name Attribution Treatment Comments  Dysesthesia 0        Neuralgia 0        Paresthesia 0        Peripheral Motor neuropathy 0        Peripheral Sensory neuropathy 0                 Yolande Jolly, BSN, MHA, OCN 10/11/2019 9:48 AM   Mandatory study labs were collected within 10 minutes of completion of Taxol infusion - drawn by Haywood Pao, RN and processed by Collene Mares. Lab collection completed at 12:38 PM following 2 attempts to draw the blood. Taxol infusion was complete at 12:43 PM. Processed specimens will be stored in -80 degree freezer and batch shipped to [facility] in 2 weeks. Yolande Jolly, BSN, MHA, OCN 10/11/2019 2:38 PM

## 2019-10-11 NOTE — Progress Notes (Signed)
Taxol infusion started at 1045 and was titrated per protocol. At 1120 when RN was checking pt.'s vitals. Pt stated that she "felt dizzy and lightheaded." Taxol infusion stopped immediately, 1L of NS started, and MD notified. VSS. **See flowsheets for vitals** At 1122 MD at chairside to assess pt. At 1125 pt states that she is starting to feel better. Per MD, pt has been experiencing dizziness/lightheadness at home and that this is not a reaction. Per MD to continueTaxol titration/infusion. RN proceeded with Taxol infusion as ordered, Taxol infusion ended at 1243. Pt tolerated the rest of the infusion well with no signs of complications or reactions. VSS. RN educated pt on the importance of calling the clinic if any complications occur at home and if it is an emergency to call 911. Pt verbalized understanding. Pt stable for discharge. Pt discharged at 1250.   Kurk Corniel [employer]

## 2019-10-13 DIAGNOSIS — R55 Syncope and collapse: Secondary | ICD-10-CM | POA: Diagnosis not present

## 2019-10-14 ENCOUNTER — Encounter: Payer: Self-pay | Admitting: Cardiology

## 2019-10-14 ENCOUNTER — Ambulatory Visit (INDEPENDENT_AMBULATORY_CARE_PROVIDER_SITE_OTHER): Payer: Medicare HMO | Admitting: Cardiology

## 2019-10-14 ENCOUNTER — Other Ambulatory Visit: Payer: Self-pay

## 2019-10-14 VITALS — BP 120/62 | HR 98 | Ht 61.0 in | Wt 162.0 lb

## 2019-10-14 DIAGNOSIS — R55 Syncope and collapse: Secondary | ICD-10-CM | POA: Diagnosis not present

## 2019-10-14 DIAGNOSIS — I10 Essential (primary) hypertension: Secondary | ICD-10-CM

## 2019-10-14 NOTE — Patient Instructions (Signed)
Medication Instructions:  - Your physician recommends that you continue on your current medications as directed. Please refer to the Current Medication list given to you today.  *If you need a refill on your cardiac medications before your next appointment, please call your pharmacy*   Lab Work: - none ordered  If you have labs (blood work) drawn today and your tests are completely normal, you will receive your results only by: Marland Kitchen MyChart Message (if you have MyChart) OR . A paper copy in the mail If you have any lab test that is abnormal or we need to change your treatment, we will call you to review the results.   Testing/Procedures: - none ordered   Follow-Up: At [facility], you and your health needs are our priority.  As part of our continuing mission to provide you with exceptional heart care, we have created designated Provider Care Teams.  These Care Teams include your primary Cardiologist (physician) and Advanced Practice Providers (APPs -  Physician Assistants and Nurse Practitioners) who all work together to provide you with the care you need, when you need it.  We recommend signing up for the patient portal called "MyChart".  Sign up information is provided on this After Visit Summary.  MyChart is used to connect with patients for Virtual Visits (Telemedicine).  Patients are able to view lab/test results, encounter notes, upcoming appointments, etc.  Non-urgent messages can be sent to your provider as well.   To learn more about what you can do with MyChart, go to [email or web address].    Your next appointment:   As needed   The format for your next appointment:   n/a  Provider:   Kate Sable, MD   Other Instructions n/a

## 2019-10-14 NOTE — Progress Notes (Signed)
Cardiology Office Note:    Date:  10/14/2019   ID:  Dawn Alvarez, [place] May 12, 1956, MRN [id number]  PCP:  Lavera Guise, MD  Cardiologist:  Kate Sable, MD  Electrophysiologist:  None   Referring MD: Lavera Guise, MD   Chief Complaint  Patient presents with  . Other    follow up [place]. Meds reviewed verbally with patient.     History of Present Illness:    Dawn Alvarez is a 64 y.o. female with a hx of hypertension, diabetes, breast cancer on chemotherapy, former smoker x20 years who presents for follow-up.  She was last seen due to syncope.  Presented to the ED for syncopal work-up where echocardiogram showed normal systolic and diastolic function, EF 60 to 65%.  Cardiac monitor was placed to evaluate any significant arrhythmias.  Patient states having an episode of dizziness 1 week ago while walking to daughter's car.  She felt diaphoretic.  Her daughter checked her blood pressures with systolic in the 06T.  Since then her lisinopril and hydralazine have been stopped.  Her blood pressures have been normal since then.  No further episodes of diaphoresis or dizziness have occurred.  Her cancer treatment with chemotherapy is going well.  Historical notes Patient presented to the ED on 09/04/2019 due to a syncopal episode.  Patient was at home cooking and then suddenly passed out.  She denies any chest pain, palpitations, shortness of breath prior to passing out.  She denies losing consciousness, stating she was aware of falling.  She did feel little bit sweaty prior to fall.  She was brought to the emergency room and kept for 1 day for observation. Echocardiogram on 09/04/2019 showed normal systolic function with EF 60 to 65%, mild LVH, normal diastolic function.  Telemetry monitoring did not reveal any arrhythmias.  She had a similar episode roughly a year ago where she was at a friend's porch and suddenly passed out.  She denies any history of heart disease.  Currently undergoing  chemo treatment at the cancer center for breast cancer.  Past Medical History:  Diagnosis Date  . Asthma   . Cancer ([place])    breast  . COPD exacerbation ([place]) 04/12/2016  . Diabetes mellitus without complication ([place])   . Hyperlipemia   . Hypertension   . Personal history of chemotherapy     Past Surgical History:  Procedure Laterality Date  . BREAST BIOPSY Right 07/05/2019   [place] bx venus marker, grade 3 invasive mammary carcinoma  . BREAST BIOPSY Right 07/05/2019   LN bx, hydromarker,PREDOMINANTLY BLOOD AND FIBROADIPOSE TISSUE, WITH SCANT LYMPHOID TISSUE PRESENT      . CESAREAN SECTION    . COLONOSCOPY WITH PROPOFOL N/A 09/11/2015   Procedure: COLONOSCOPY WITH PROPOFOL;  Surgeon: Lucilla Lame, MD;  Location: [facility] ENDOSCOPY;  Service: Endoscopy;  Laterality: N/A;  . IR IMAGING GUIDED PORT INSERTION  07/20/2019    Current Medications: Current Meds  Medication Sig  . Alcohol Swabs (B-D SINGLE USE SWABS REGULAR) PADS Use as directed twice a day  . amLODipine (NORVASC) 5 MG tablet Take one tab po qd for htn  . Blood Glucose Monitoring Suppl (TRUE METRIX AIR GLUCOSE METER) w/Device KIT 1 Device by Does not apply route in the morning and at bedtime. Use ad directed e11.65  . brimonidine (ALPHAGAN) 0.2 % ophthalmic solution Place 1 drop into both eyes 2 (two) times daily.  Marland Kitchen glucose blood (TRUE METRIX BLOOD GLUCOSE TEST) test strip Use as instructed  twice a daily diag e11.65  . insulin detemir (LEVEMIR FLEXTOUCH) 100 UNIT/ML FlexPen Inject 44 Units into the skin daily.  . Insulin Pen Needle (PEN NEEDLES) 31G X 8 MM MISC Use as directed with insulin E11.65  . Lancets Misc. (ACCU-CHEK MULTICLIX LANCET DEV) KIT by Does not apply route. Use as directed twice a day diag E11.65  . lidocaine-prilocaine (EMLA) cream   . lisinopril (ZESTRIL) 20 MG tablet Take one tab po qd for HTN  . Omega-3 Fatty Acids (FISH OIL) 1000 MG CAPS Take 1,000 mg by mouth daily.   . prochlorperazine (COMPAZINE) 10 MG  tablet Take 1 tablet (10 mg total) by mouth every 6 (six) hours as needed for nausea or vomiting.  . Semaglutide, 1 MG/DOSE, (OZEMPIC, 1 MG/DOSE,) 2 MG/1.5ML SOPN Inject 1 mg into the skin once a week.  Marland Kitchen SIMBRINZA 1-0.2 % SUSP   . TRUEplus Lancets 30G MISC Use as directed twice daily diag e11.65     Allergies:   Patient has no known allergies.   Social History   Socioeconomic History  . Marital status: Married    Spouse name: Not on file  . Number of children: Not on file  . Years of education: Not on file  . Highest education level: Not on file  Occupational History  . Not on file  Tobacco Use  . Smoking status: Former Smoker    Packs/day: 1.00    Years: 2.00    Pack years: 2.00    Types: Cigarettes    Quit date: 05/19/2019    Years since quitting: 0.4  . Smokeless tobacco: Never Used  Substance and Sexual Activity  . Alcohol use: Not Currently  . Drug use: No  . Sexual activity: Never    Birth control/protection: Abstinence  Other Topics Concern  . Not on file  Social History Narrative  . Not on file   Social Determinants of Health   Financial Resource Strain:   . Difficulty of Paying Living Expenses:   Food Insecurity:   . Worried About Charity fundraiser in the Last Year:   . Arboriculturist in the Last Year:   Transportation Needs:   . Film/video editor (Medical):   Marland Kitchen Lack of Transportation (Non-Medical):   Physical Activity:   . Days of Exercise per Week:   . Minutes of Exercise per Session:   Stress:   . Feeling of Stress :   Social Connections:   . Frequency of Communication with Friends and Family:   . Frequency of Social Gatherings with Friends and Family:   . Attends Religious Services:   . Active Member of Clubs or Organizations:   . Attends Archivist Meetings:   Marland Kitchen Marital Status:      Family History: The patient's family history includes Diabetes in her father and mother; Hypertension in her father and mother.  ROS:    Please see the history of present illness.     All other systems reviewed and are negative.  EKGs/Labs/Other Studies Reviewed:    The following studies were reviewed today:   EKG:  EKG not obtained today Recent Labs: 09/03/2019: TSH 0.908 09/04/2019: Magnesium 2.2 10/11/2019: ALT 16; BUN 7; Creatinine, Ser 0.80; Hemoglobin 9.1; Platelets 304; Potassium 3.7; Sodium 136  Recent Lipid Panel    Component Value Date/Time   CHOL 227 (H) 03/16/2019 1046   CHOL 211 (H) 05/27/2013 0512   TRIG 216 (H) 03/16/2019 1046   TRIG 232 (H) 05/27/2013  0512   HDL 41 03/16/2019 1046   HDL 38 (L) 05/27/2013 0512   CHOLHDL 5.8 (H) 10/05/2017 0938   CHOLHDL 6.1 04/12/2016 0130   VLDL UNABLE TO CALCULATE IF TRIGLYCERIDE OVER 400 mg/dL 04/12/2016 0130   VLDL 46 (H) 05/27/2013 0512   LDLCALC 147 (H) 03/16/2019 1046   LDLCALC 127 (H) 05/27/2013 0512    Physical Exam:    VS:  BP 120/62 (BP Location: Left Arm, Patient Position: Sitting, Cuff Size: Normal)   Pulse 98   Ht _0  (1.549 m)   Wt 162 lb (73.5 kg)   SpO2 97%   BMI 30.61 kg/m     Wt Readings from Last 3 Encounters:  10/14/19 162 lb (73.5 kg)  10/11/19 164 lb 3.2 oz (74.5 kg)  10/07/19 169 lb (76.7 kg)     GEN:  Well nourished, well developed in no acute distress HEENT: Normal NECK: No JVD; No carotid bruits LYMPHATICS: No lymphadenopathy CARDIAC: RRR, 1/6 systolic murmur, rubs, gallops RESPIRATORY:  Clear to auscultation without rales, wheezing or rhonchi  ABDOMEN: Soft, non-tender, non-distended MUSCULOSKELETAL:  No edema; No deformity  SKIN: Warm and dry NEUROLOGIC:  Alert and oriented x 3 PSYCHIATRIC:  Normal affect   ASSESSMENT:    1. Syncope and collapse   2. Essential hypertension    PLAN:    In order of problems listed above:  1. Patient with an episode of syncope.  Cardiac monitor did not show any significant arrhythmias.  Her echocardiogram showed normal systolic and diastolic function, no structural abnormalities  to suggest cardiac etiology. Symptoms are likely vasovagal or overmedication with BP meds.  2. History of hypertension, blood pressure is normal.  Advised patient to check blood pressure frequently.  With patient undergoing chemotherapy, her appetite is low.  Recommend stopping amlodipine if blood pressure is in the 90s to low 100s.    Follow-up as needed  This note was generated in part or whole with voice recognition software. Voice recognition is usually quite accurate but there are transcription errors that can and very often do occur. I apologize for any typographical errors that were not detected and corrected.  Medication Adjustments/Labs and Tests Ordered: Current medicines are reviewed at length with the patient today.  Concerns regarding medicines are outlined above.  No orders of the defined types were placed in this encounter.  No orders of the defined types were placed in this encounter.   Patient Instructions  Medication Instructions:  - Your physician recommends that you continue on your current medications as directed. Please refer to the Current Medication list given to you today.  *If you need a refill on your cardiac medications before your next appointment, please call your pharmacy*   Lab Work: - none ordered  If you have labs (blood work) drawn today and your tests are completely normal, you will receive your results only by: Marland Kitchen MyChart Message (if you have MyChart) OR . A paper copy in the mail If you have any lab test that is abnormal or we need to change your treatment, we will call you to review the results.   Testing/Procedures: - none ordered   Follow-Up: At [facility], you and your health needs are our priority.  As part of our continuing mission to provide you with exceptional heart care, we have created designated Provider Care Teams.  These Care Teams include your primary Cardiologist (physician) and Advanced Practice Providers (APPs -  Physician  Assistants and Nurse Practitioners) who all work  together to provide you with the care you need, when you need it.  We recommend signing up for the patient portal called "MyChart".  Sign up information is provided on this After Visit Summary.  MyChart is used to connect with patients for Virtual Visits (Telemedicine).  Patients are able to view lab/test results, encounter notes, upcoming appointments, etc.  Non-urgent messages can be sent to your provider as well.   To learn more about what you can do with MyChart, go to [email or web address].    Your next appointment:   As needed   The format for your next appointment:   n/a  Provider:   Kate Sable, MD   Other Instructions n/a     Signed, Kate Sable, MD  10/14/2019 12:19 PM    [place]

## 2019-10-17 ENCOUNTER — Telehealth: Payer: Self-pay

## 2019-10-17 NOTE — Telephone Encounter (Signed)
Called lmom informing patient of appointment on 10/19/2019. klh

## 2019-10-18 ENCOUNTER — Ambulatory Visit: Payer: Medicare HMO | Admitting: Cardiology

## 2019-10-18 ENCOUNTER — Inpatient Hospital Stay: Payer: Medicare HMO

## 2019-10-18 ENCOUNTER — Encounter: Payer: Self-pay | Admitting: Oncology

## 2019-10-18 ENCOUNTER — Inpatient Hospital Stay (HOSPITAL_BASED_OUTPATIENT_CLINIC_OR_DEPARTMENT_OTHER): Payer: Medicare HMO | Admitting: Oncology

## 2019-10-18 ENCOUNTER — Encounter: Payer: Self-pay | Admitting: *Deleted

## 2019-10-18 ENCOUNTER — Ambulatory Visit: Payer: Medicare HMO | Admitting: Internal Medicine

## 2019-10-18 ENCOUNTER — Other Ambulatory Visit: Payer: Self-pay

## 2019-10-18 VITALS — BP 116/75 | HR 84 | Temp 96.6°F | Resp 18 | Wt 162.3 lb

## 2019-10-18 VITALS — BP 131/78 | HR 82 | Resp 16

## 2019-10-18 DIAGNOSIS — Z79899 Other long term (current) drug therapy: Secondary | ICD-10-CM | POA: Diagnosis not present

## 2019-10-18 DIAGNOSIS — C50411 Malignant neoplasm of upper-outer quadrant of right female breast: Secondary | ICD-10-CM

## 2019-10-18 DIAGNOSIS — D6481 Anemia due to antineoplastic chemotherapy: Secondary | ICD-10-CM | POA: Diagnosis not present

## 2019-10-18 DIAGNOSIS — E876 Hypokalemia: Secondary | ICD-10-CM | POA: Diagnosis not present

## 2019-10-18 DIAGNOSIS — Z87891 Personal history of nicotine dependence: Secondary | ICD-10-CM | POA: Diagnosis not present

## 2019-10-18 DIAGNOSIS — Z5111 Encounter for antineoplastic chemotherapy: Secondary | ICD-10-CM

## 2019-10-18 DIAGNOSIS — T451X5A Adverse effect of antineoplastic and immunosuppressive drugs, initial encounter: Secondary | ICD-10-CM | POA: Diagnosis not present

## 2019-10-18 DIAGNOSIS — I951 Orthostatic hypotension: Secondary | ICD-10-CM | POA: Diagnosis not present

## 2019-10-18 DIAGNOSIS — Z95828 Presence of other vascular implants and grafts: Secondary | ICD-10-CM

## 2019-10-18 DIAGNOSIS — Z171 Estrogen receptor negative status [ER-]: Secondary | ICD-10-CM | POA: Diagnosis not present

## 2019-10-18 LAB — CBC WITH DIFFERENTIAL/PLATELET
Abs Immature Granulocytes: 0.03 10*3/uL (ref 0.00–0.07)
Basophils Absolute: 0.1 10*3/uL (ref 0.0–0.1)
Basophils Relative: 2 %
Eosinophils Absolute: 0.3 10*3/uL (ref 0.0–0.5)
Eosinophils Relative: 5 %
HCT: 26.7 % — ABNORMAL LOW (ref 36.0–46.0)
Hemoglobin: 8.9 g/dL — ABNORMAL LOW (ref 12.0–15.0)
Immature Granulocytes: 1 %
Lymphocytes Relative: 18 %
Lymphs Abs: 1.1 10*3/uL (ref 0.7–4.0)
MCH: 29 pg (ref 26.0–34.0)
MCHC: 33.3 g/dL (ref 30.0–36.0)
MCV: 87 fL (ref 80.0–100.0)
Monocytes Absolute: 0.5 10*3/uL (ref 0.1–1.0)
Monocytes Relative: 9 %
Neutro Abs: 4 10*3/uL (ref 1.7–7.7)
Neutrophils Relative %: 65 %
Platelets: 278 10*3/uL (ref 150–400)
RBC: 3.07 MIL/uL — ABNORMAL LOW (ref 3.87–5.11)
RDW: 17.8 % — ABNORMAL HIGH (ref 11.5–15.5)
WBC: 6 10*3/uL (ref 4.0–10.5)
nRBC: 0 % (ref 0.0–0.2)

## 2019-10-18 LAB — COMPREHENSIVE METABOLIC PANEL
ALT: 14 U/L (ref 0–44)
AST: 17 U/L (ref 15–41)
Albumin: 3.4 g/dL — ABNORMAL LOW (ref 3.5–5.0)
Alkaline Phosphatase: 30 U/L — ABNORMAL LOW (ref 38–126)
Anion gap: 8 (ref 5–15)
BUN: 15 mg/dL (ref 8–23)
CO2: 22 mmol/L (ref 22–32)
Calcium: 8.9 mg/dL (ref 8.9–10.3)
Chloride: 107 mmol/L (ref 98–111)
Creatinine, Ser: 0.8 mg/dL (ref 0.44–1.00)
GFR calc Af Amer: >60 mL/min (ref >60)
GFR calc non Af Amer: >60 mL/min (ref >60)
Glucose, Bld: 241 mg/dL — ABNORMAL HIGH (ref 70–99)
Potassium: 3.3 mmol/L — ABNORMAL LOW (ref 3.5–5.1)
Sodium: 137 mmol/L (ref 135–145)
Total Bilirubin: 0.4 mg/dL (ref 0.3–1.2)
Total Protein: 6.6 g/dL (ref 6.5–8.1)

## 2019-10-18 MED ORDER — HEPARIN SOD (PORK) LOCK FLUSH 100 UNIT/ML IV SOLN
500.0000 [IU] | Freq: Once | INTRAVENOUS | Status: AC | PRN
  Administered 2019-10-18: 500 [IU]
  Filled 2019-10-18: qty 5

## 2019-10-18 MED ORDER — DIPHENHYDRAMINE HCL 50 MG/ML IJ SOLN
50.0000 mg | Freq: Once | INTRAMUSCULAR | Status: AC
  Administered 2019-10-18: 50 mg via INTRAVENOUS
  Filled 2019-10-18: qty 1

## 2019-10-18 MED ORDER — FAMOTIDINE IN NACL 20-0.9 MG/50ML-% IV SOLN
20.0000 mg | Freq: Once | INTRAVENOUS | Status: AC
  Administered 2019-10-18: 20 mg via INTRAVENOUS
  Filled 2019-10-18: qty 50

## 2019-10-18 MED ORDER — SODIUM CHLORIDE 0.9% FLUSH
10.0000 mL | INTRAVENOUS | Status: DC | PRN
  Administered 2019-10-18: 10 mL via INTRAVENOUS
  Filled 2019-10-18: qty 10

## 2019-10-18 MED ORDER — HEPARIN SOD (PORK) LOCK FLUSH 100 UNIT/ML IV SOLN
INTRAVENOUS | Status: AC
  Filled 2019-10-18: qty 5

## 2019-10-18 MED ORDER — SODIUM CHLORIDE 0.9 % IV SOLN
10.0000 mg | Freq: Once | INTRAVENOUS | Status: AC
  Administered 2019-10-18: 10 mg via INTRAVENOUS
  Filled 2019-10-18: qty 10

## 2019-10-18 MED ORDER — POTASSIUM CHLORIDE ER 10 MEQ PO TBCR: 10.0000 meq | EXTENDED_RELEASE_TABLET | Freq: Every day | ORAL | 0 refills | Status: DC

## 2019-10-18 MED ORDER — SODIUM CHLORIDE 0.9 % IV SOLN
80.0000 mg/m2 | Freq: Once | INTRAVENOUS | Status: AC
  Administered 2019-10-18: 150 mg via INTRAVENOUS
  Filled 2019-10-18: qty 25

## 2019-10-18 MED ORDER — SODIUM CHLORIDE 0.9% FLUSH
10.0000 mL | INTRAVENOUS | Status: DC | PRN
  Filled 2019-10-18: qty 10

## 2019-10-18 MED ORDER — SODIUM CHLORIDE 0.9 % IV SOLN
Freq: Once | INTRAVENOUS | Status: AC
  Filled 2019-10-18: qty 250

## 2019-10-18 NOTE — Research (Signed)
Met with patient Dawn Alvarez regarding the DCP-001 study. Reviewed purpose of the study including the fact that it is a one time questionnaire sponsored by the [place] in order to determine why patients elect to participate or not to participate in clinical trials. Reviewed the additional information that would be collected and reported to the [place] including demographic information, information about her cancer, smoking history, financial and insurance information, race and ethnicity, etc - along with how the information would be kept confidential. Patient voices uncertainty about the study and was informed that participation is voluntary and that she is not obligated to participate. Patient states she will need to talk to her daughter before she makes her decision. She is in the clinic alone this morning. Copy of the informed consent form and my contact information were given to the patient along with a copy of the actual questionnaire she would be completing, and she will take this to her daughter and let her review it before making her decision. Will contact the patient next week while in clinic for her decision. Yolande Jolly, BSN, MHA, OCN 10/18/2019 9:07 AM   Spoke with patient Dawn Alvarez about participating in the DCP-001 study and she states she would rather not participate at this time due to feeling overwhelmed. Informed that participation is strictly voluntary and she is not obligated to participate is she elects not to. Informed that I will see her in a couple of weeks for her next neuropathy study assessment and patient agrees. Yolande Jolly, BSN, MHA, OCN 10/25/2019 9:02 AM

## 2019-10-18 NOTE — Progress Notes (Signed)
Hematology/Oncology follow up note [facility] Telephone:(336) [phone] Fax:(336) [phone]   Patient Care Team: Lavera Guise, MD as PCP - General (Internal Medicine) Kate Sable, MD as PCP - Cardiology (Cardiology) Rico Junker, RN as Registered Nurse Theodore Demark, RN as Registered Nurse  REFERRING PROVIDER: Lavera Guise, MD  CHIEF COMPLAINTS/REASON FOR VISIT:  Follow-up for breast cancer  HISTORY OF PRESENTING ILLNESS:   Dawn Alvarez is a  64 y.o.  female with PMH listed below was seen in consultation at the request of  Lavera Guise, MD  for evaluation of breast cancer Patient had screening mammogram done 08/12/2017 which showed right breast asymmetry.  A diagnostic mammogram was suggested and patient did not have it done. Patient felt her right breast mass for a few weeks.  She had bilateral diagnostic mammogram done on 06/30/2019. 2.8 x 2.4 x 2.6 cm right breast mass, 11:00, 10 cm from the nipple.  There is a single mildly abnormal node in the right axilla with a cortex measuring up to 4.4 mm.  No other suspicious findings. Patient underwent ultrasound-guided core biopsy of the right breast mass and right axilla lymph node Pathology showed invasive mammary carcinoma, no special type, grade 3, ER/PR HER-2 status are pending. Right axillary lymph node biopsy showed predominantly blood in the fibroadipose tissue, with scant lymphoid tissue present.  No definite malignancy was identified.  Patient was referred to cancer center to establish care and discuss treatment plan. Menarche 6 Postmenopausal.  LMP when she was 65 years old. She recalls use of birth control pills. Denies any hormone .  Replacement therapy. Denies any prior chest radiation. She reports family history of maternal grandmother and 2 maternal cousins were diagnosed with cancer.  She does not know about details. # 09/03/2019, patient had an episode of syncope and EMS was called  and patient sent to emergency room.  Patient was noted to have a heart rate 45-65 and blood glucose level of 286. Patient denies any history of nausea, vomiting, diarrhea.  Denies any dehydration.  She reported history of intermittent abdominal pain. CT head without contrast showed no acute intracranial abnormality.  CT abdomen pelvis with contrast is negative for evidence of acute abdominal abnormality.  Patient was given IV fluid. Patient was observed with holding her blood pressure medication including amlodipine, bisoprolol.  No arrhythmia was noted on telemetry.  Echocardiogram showed LVEF 60 to 65%.  No valvular abnormalities.  Beta-blocker and diuretics was discontinued.  # Patient was seen by cardiology Dr. Garen Lah for evaluation of syncope and was cleared for chemotherapy. # 10/03/2019, interval unilateral right diagnostic mammogram showed right breast mass 11:00 size has decreased to 3.7 x 1.3 x 2.6 cm.  Previously mass measured 2.8 x 2.4 x 2.6 cm  INTERVAL HISTORY Dawn Alvarez is a 64 y.o. female who has above history reviewed by me today presents for follow up visit for management of breast cancer, evaluation prior to chemotherapy. Problems and complaints are listed below: Patient reports no concerns after last Taxol treatment. She has no new complaints today.  Denies any numbness or tingling sensation. No additional dizziness or lightheaded episodes. Blood pressure 116/75 today.  Improved.  Review of Systems  Constitutional: Negative for appetite change, chills, fatigue and fever.  HENT:   Negative for hearing loss and voice change.   Eyes: Negative for eye problems.  Respiratory: Negative for chest tightness and cough.   Cardiovascular: Negative for chest pain.  Gastrointestinal: Negative  for abdominal distention, abdominal pain and blood in stool.  Endocrine: Negative for hot flashes.  Genitourinary: Negative for difficulty urinating and frequency.   Musculoskeletal:  Negative for arthralgias.  Skin: Negative for itching and rash.  Neurological: Negative for extremity weakness.  Hematological: Negative for adenopathy.  Psychiatric/Behavioral: Negative for confusion.    MEDICAL HISTORY:  Past Medical History:  Diagnosis Date  . Asthma   . Cancer ([place])    breast  . COPD exacerbation ([place]) 04/12/2016  . Diabetes mellitus without complication ([place])   . Hyperlipemia   . Hypertension   . Personal history of chemotherapy     SURGICAL HISTORY: Past Surgical History:  Procedure Laterality Date  . BREAST BIOPSY Right 07/05/2019   [place] bx venus marker, grade 3 invasive mammary carcinoma  . BREAST BIOPSY Right 07/05/2019   LN bx, hydromarker,PREDOMINANTLY BLOOD AND FIBROADIPOSE TISSUE, WITH SCANT LYMPHOID TISSUE PRESENT      . CESAREAN SECTION    . COLONOSCOPY WITH PROPOFOL N/A 09/11/2015   Procedure: COLONOSCOPY WITH PROPOFOL;  Surgeon: Lucilla Lame, MD;  Location: [facility] ENDOSCOPY;  Service: Endoscopy;  Laterality: N/A;  . IR IMAGING GUIDED PORT INSERTION  07/20/2019    SOCIAL HISTORY: Social History   Socioeconomic History  . Marital status: Married    Spouse name: Not on file  . Number of children: Not on file  . Years of education: Not on file  . Highest education level: Not on file  Occupational History  . Not on file  Tobacco Use  . Smoking status: Former Smoker    Packs/day: 1.00    Years: 2.00    Pack years: 2.00    Types: Cigarettes    Quit date: 05/19/2019    Years since quitting: 0.4  . Smokeless tobacco: Never Used  Substance and Sexual Activity  . Alcohol use: Not Currently  . Drug use: No  . Sexual activity: Never    Birth control/protection: Abstinence  Other Topics Concern  . Not on file  Social History Narrative  . Not on file   Social Determinants of Health   Financial Resource Strain:   . Difficulty of Paying Living Expenses:   Food Insecurity:   . Worried About Charity fundraiser in the Last Year:   . Youth worker in the Last Year:   Transportation Needs:   . Film/video editor (Medical):   Marland Kitchen Lack of Transportation (Non-Medical):   Physical Activity:   . Days of Exercise per Week:   . Minutes of Exercise per Session:   Stress:   . Feeling of Stress :   Social Connections:   . Frequency of Communication with Friends and Family:   . Frequency of Social Gatherings with Friends and Family:   . Attends Religious Services:   . Active Member of Clubs or Organizations:   . Attends Archivist Meetings:   Marland Kitchen Marital Status:   Intimate Partner Violence:   . Fear of Current or Ex-Partner:   . Emotionally Abused:   Marland Kitchen Physically Abused:   . Sexually Abused:     FAMILY HISTORY: Family History  Problem Relation Age of Onset  . Diabetes Mother   . Hypertension Mother   . Hypertension Father   . Diabetes Father     ALLERGIES:  has No Known Allergies.  MEDICATIONS:  Current Outpatient Medications  Medication Sig Dispense Refill  . Alcohol Swabs (B-D SINGLE USE SWABS REGULAR) PADS Use as directed twice a day 300  each 3  . amLODipine (NORVASC) 5 MG tablet Take one tab po qd for htn 30 tablet 3  . Blood Glucose Monitoring Suppl (TRUE METRIX AIR GLUCOSE METER) w/Device KIT 1 Device by Does not apply route in the morning and at bedtime. Use ad directed e11.65 1 kit 0  . brimonidine (ALPHAGAN) 0.2 % ophthalmic solution Place 1 drop into both eyes 2 (two) times daily.    Marland Kitchen glucose blood (TRUE METRIX BLOOD GLUCOSE TEST) test strip Use as instructed twice a daily diag e11.65 100 each 3  . insulin detemir (LEVEMIR FLEXTOUCH) 100 UNIT/ML FlexPen Inject 44 Units into the skin daily. 15 mL 3  . Insulin Pen Needle (PEN NEEDLES) 31G X 8 MM MISC Use as directed with insulin E11.65 100 each 1  . Lancets Misc. (ACCU-CHEK MULTICLIX LANCET DEV) KIT by Does not apply route. Use as directed twice a day diag E11.65    . lidocaine-prilocaine (EMLA) cream     . lisinopril (ZESTRIL) 20 MG tablet Take one  tab po qd for HTN 30 tablet 3  . Omega-3 Fatty Acids (FISH OIL) 1000 MG CAPS Take 1,000 mg by mouth daily.     . Semaglutide, 1 MG/DOSE, (OZEMPIC, 1 MG/DOSE,) 2 MG/1.5ML SOPN Inject 1 mg into the skin once a week. 5 pen 3  . SIMBRINZA 1-0.2 % SUSP     . TRUEplus Lancets 30G MISC Use as directed twice daily diag e11.65 100 each 3  . potassium chloride (KLOR-CON) 10 MEQ tablet Take 1 tablet (10 mEq total) by mouth daily for 7 days. 7 tablet 0  . prochlorperazine (COMPAZINE) 10 MG tablet Take 1 tablet (10 mg total) by mouth every 6 (six) hours as needed for nausea or vomiting. (Patient not taking: Reported on 10/18/2019) 60 tablet 1   No current facility-administered medications for this visit.   Facility-Administered Medications Ordered in Other Visits  Medication Dose Route Frequency Provider Last Rate Last Admin  . dexamethasone (DECADRON) 10 mg in sodium chloride 0.9 % 50 mL IVPB  10 mg Intravenous Once Earlie Server, MD      . heparin lock flush 100 unit/mL  500 Units Intracatheter Once PRN Earlie Server, MD      . PACLitaxel (TAXOL) 150 mg in sodium chloride 0.9 % 250 mL chemo infusion (</= 75m/m2)  80 mg/m2 (Treatment Plan Recorded) Intravenous Once YEarlie Server MD      . sodium chloride flush (NS) 0.9 % injection 10 mL  10 mL Intravenous PRN YEarlie Server MD   10 mL at 10/11/19 0817  . sodium chloride flush (NS) 0.9 % injection 10 mL  10 mL Intracatheter PRN YEarlie Server MD         PHYSICAL EXAMINATION: ECOG PERFORMANCE STATUS: 1 - Symptomatic but completely ambulatory Vitals:   10/18/19 0841  BP: 116/75  Pulse: 84  Resp: 18  Temp: (!) 96.6 F (35.9 C)   Filed Weights   10/18/19 0841  Weight: 162 lb 4.8 oz (73.6 kg)    Physical Exam Constitutional:      General: She is not in acute distress. HENT:     Head: Normocephalic and atraumatic.  Eyes:     General: No scleral icterus. Cardiovascular:     Rate and Rhythm: Normal rate and regular rhythm.     Heart sounds: Normal heart sounds.    Pulmonary:     Effort: Pulmonary effort is normal. No respiratory distress.     Breath sounds: No wheezing.  Abdominal:  General: Bowel sounds are normal. There is no distension.     Palpations: Abdomen is soft.  Musculoskeletal:        General: No deformity. Normal range of motion.     Cervical back: Normal range of motion and neck supple.  Skin:    General: Skin is warm and dry.     Findings: No erythema or rash.  Neurological:     Mental Status: She is alert and oriented to person, place, and time. Mental status is at baseline.     Cranial Nerves: No cranial nerve deficit.     Coordination: Coordination normal.  Psychiatric:        Mood and Affect: Mood normal.      LABORATORY DATA:  I have reviewed the data as listed Lab Results  Component Value Date   WBC 6.0 10/18/2019   HGB 8.9 (L) 10/18/2019   HCT 26.7 (L) 10/18/2019   MCV 87.0 10/18/2019   PLT 278 10/18/2019   Recent Labs    10/06/19 0807 10/11/19 0812 10/18/19 0801  NA 138 136 137  K 3.7 3.7 3.3*  CL 104 104 107  CO2 '23 24 22  ' GLUCOSE 152* 131* 241*  BUN 10 7* 15  CREATININE 0.99 0.80 0.80  CALCIUM 9.1 8.7* 8.9  GFRNONAA >60 >60 >60  GFRAA >60 >60 >60  PROT 7.1 6.7 6.6  ALBUMIN 3.9 3.5 3.4*  AST '24 15 17  ' ALT '20 16 14  ' ALKPHOS 47 38 30*  BILITOT 0.5 0.5 0.4   Iron/TIBC/Ferritin/ %Sat No results found for: IRON, TIBC, FERRITIN, IRONPCTSAT    RADIOGRAPHIC STUDIES: I have personally reviewed the radiological images as listed and agreed with the findings in the report. CT Head Wo Contrast  Result Date: 09/03/2019 CLINICAL DATA:  Encephalopathy. EXAM: CT HEAD WITHOUT CONTRAST TECHNIQUE: Contiguous axial images were obtained from the base of the skull through the vertex without intravenous contrast. COMPARISON:  Head CT 07/30/2019 FINDINGS: Brain: Brain volume is normal for age. No intracranial hemorrhage, mass effect, or midline shift. No hydrocephalus. The basilar cisterns are patent. Unchanged  periventricular and deep chronic small vessel ischemia. Punctate lacunar infarct in the right thalamus. No evidence of territorial infarct or acute ischemia. No extra-axial or intracranial fluid collection. Vascular: No hyperdense vessel. Skull: Normal. Negative for fracture or focal lesion. Sinuses/Orbits: Paranasal sinuses and mastoid air cells are clear. The visualized orbits are unremarkable. Other: None. IMPRESSION: 1. No acute intracranial abnormality. 2. Stable chronic small vessel ischemia from prior. Electronically Signed   By: Keith Rake M.D.   On: 09/03/2019 19:53   CT Head Wo Contrast  Result Date: 07/30/2019 CLINICAL DATA:  63 year old female with altered mental status EXAM: CT HEAD WITHOUT CONTRAST TECHNIQUE: Contiguous axial images were obtained from the base of the skull through the vertex without intravenous contrast. COMPARISON:  MR 05/04/2010, CT 12/28/2018 FINDINGS: Brain: No acute intracranial hemorrhage. No midline shift or mass effect. Gray-white differentiation maintained. Confluent hypodensity in the periventricular white matter, similar to the prior MR and CT. Unremarkable appearance of the ventricular system. Vascular: Mild atherosclerotic calcifications. Skull: No acute fracture.  No aggressive bone lesion identified. Sinuses/Orbits: Unremarkable appearance of the orbits. Mastoid air cells clear. No middle ear effusion. No significant sinus disease. Other: None IMPRESSION: Negative for acute intracranial abnormality. Chronic microvascular ischemic disease. Electronically Signed   By: Corrie Mckusick D.O.   On: 07/30/2019 14:46   NM Cardiac Muga Rest  Result Date: 07/25/2019 CLINICAL DATA:  Breast cancer.  Evaluate cardiac function in relation to chemotherapy. EXAM: NUCLEAR MEDICINE CARDIAC BLOOD POOL IMAGING (MUGA) TECHNIQUE: Cardiac multi-gated acquisition was performed at rest following intravenous injection of Tc-31mlabeled red blood cells. RADIOPHARMACEUTICALS:  22.8 mCi  Tc-934mertechnetate in-vitro labeled red blood cells IV COMPARISON:  None FINDINGS: No  focal wall motion abnormality of the left ventricle. Calculated left ventricular ejection fraction equals 74 % IMPRESSION: Left ventricular ejection fraction equals74 %. Electronically Signed   By: StSuzy Bouchard.D.   On: 07/25/2019 15:16   CT ABDOMEN PELVIS W CONTRAST  Result Date: 09/03/2019 CLINICAL DATA:  Nausea vomiting. EXAM: CT ABDOMEN AND PELVIS WITH CONTRAST TECHNIQUE: Multidetector CT imaging of the abdomen and pelvis was performed using the standard protocol following bolus administration of intravenous contrast. CONTRAST:  10077mMNIPAQUE IOHEXOL 300 MG/ML  SOLN COMPARISON:  April 20, 2016 FINDINGS: Lower chest: No acute abnormality. Hepatobiliary: No focal liver abnormality is seen. No gallstones, gallbladder wall thickening, or biliary dilatation. Pancreas: Unremarkable. No pancreatic ductal dilatation or surrounding inflammatory changes. Spleen: Normal in size without focal abnormality. Adrenals/Urinary Tract: Adrenal glands are unremarkable. Kidneys are normal, without renal calculi, solid lesion, or hydronephrosis. 2 cm right renal cyst. Bladder is unremarkable. Stomach/Bowel: Stomach is within normal limits. Appendix appears normal. No evidence of bowel wall thickening, distention, or inflammatory changes. Vascular/Lymphatic: Aortic atherosclerosis. No enlarged abdominal or pelvic lymph nodes. Reproductive: Numerous some calcified myometrial masses. Other: No abdominal wall hernia or abnormality. No abdominopelvic ascites. Musculoskeletal: L4-L5 spondylosis. IMPRESSION: 1. No evidence of acute abnormalities within the solid abdominal organs. 2. Myometrial fibroids. Aortic Atherosclerosis (ICD10-I70.0). Electronically Signed   By: DobFidela SalisburyD.   On: 09/03/2019 19:54   US [place]aeast Limited Uni Right Inc Axilla  Result Date: 10/03/2019 CLINICAL DATA:  Ultrasound-guided core biopsy of mass in  the RIGHT breast 10-11 o'clock location performed 07/05/2019, showing grade 3 invasive mammary carcinoma. Ultrasound-guided core biopsy of RIGHT axillary lymph node was benign and concordant. Patient has undergone neoadjuvant treatment scans feels the mass has gotten smaller. Assess for response to treatment. EXAM: DIGITAL DIAGNOSTIC RIGHT MAMMOGRAM WITH CAD AND TOMO ULTRASOUND RIGHT BREAST COMPARISON:  07/05/2019 and earlier ACR Breast Density Category b: There are scattered areas of fibroglandular density. FINDINGS: Within the UPPER-OUTER QUADRANT of the RIGHT breast there is a spiculated mass, marked with a tissue marking clip. Lesion appears smaller compared to prior study. No new suspicious findings in the RIGHT breast. Mammographic images were processed with CAD. Targeted ultrasound is performed, showingirregular hypoechoic mass in the 11 o'clock location of the RIGHT breast 10 centimeters from the nipple, measuring 2.7 x 1.3 x 2.6 centimeters. Previously mass measured 2.8 x 2.4 x 2.6 centimeters. IMPRESSION: Smaller mass in the 11 o'clock location of the RIGHT breast. RECOMMENDATION: Treatment plan for known RIGHT breast cancer. I have discussed the findings and recommendations with the patient. If applicable, a reminder letter will be sent to the patient regarding the next appointment. BI-RADS CATEGORY  6: Known biopsy-proven malignancy. Electronically Signed   By: EliNolon NationsD.   On: 10/03/2019 14:36   MM DIAG BREAST TOMO UNI RIGHT  Result Date: 10/03/2019 CLINICAL DATA:  Ultrasound-guided core biopsy of mass in the RIGHT breast 10-11 o'clock location performed 07/05/2019, showing grade 3 invasive mammary carcinoma. Ultrasound-guided core biopsy of RIGHT axillary lymph node was benign and concordant. Patient has undergone neoadjuvant treatment scans feels the mass has gotten smaller. Assess for response to treatment. EXAM: DIGITAL DIAGNOSTIC RIGHT MAMMOGRAM WITH CAD AND TOMO  ULTRASOUND RIGHT  BREAST COMPARISON:  07/05/2019 and earlier ACR Breast Density Category b: There are scattered areas of fibroglandular density. FINDINGS: Within the UPPER-OUTER QUADRANT of the RIGHT breast there is a spiculated mass, marked with a tissue marking clip. Lesion appears smaller compared to prior study. No new suspicious findings in the RIGHT breast. Mammographic images were processed with CAD. Targeted ultrasound is performed, showingirregular hypoechoic mass in the 11 o'clock location of the RIGHT breast 10 centimeters from the nipple, measuring 2.7 x 1.3 x 2.6 centimeters. Previously mass measured 2.8 x 2.4 x 2.6 centimeters. IMPRESSION: Smaller mass in the 11 o'clock location of the RIGHT breast. RECOMMENDATION: Treatment plan for known RIGHT breast cancer. I have discussed the findings and recommendations with the patient. If applicable, a reminder letter will be sent to the patient regarding the next appointment. BI-RADS CATEGORY  6: Known biopsy-proven malignancy. Electronically Signed   By: Nolon Nations M.D.   On: 10/03/2019 14:36   ECHOCARDIOGRAM COMPLETE  Result Date: 09/04/2019    ECHOCARDIOGRAM REPORT   Patient Name:   MILDERD MANOCCHIO Date of Exam: 09/04/2019 Medical Rec #:  [id number]        Height:       61.0 in Accession #:    [id number]       Weight:       169.2 lb Date of Birth:  [birth year]        BSA:          1.759 m Patient Age:    68 years         BP:           175/83 mmHg Patient Gender: F                HR:           67 bpm. Exam Location:  [facility] Procedure: 2D Echo Indications:     Syncope 780.2/ R55  History:         Patient has no prior history of Echocardiogram examinations.  Sonographer:     Arville Go RDCS Referring Phys:  [place] Diagnosing Phys: Ida Rogue MD IMPRESSIONS  1. Left ventricular ejection fraction, by estimation, is 60 to 65%. The left ventricle has normal function. The left ventricle has no regional wall motion abnormalities. There is mild left ventricular  hypertrophy. Left ventricular diastolic parameters were normal.  2. Right ventricular systolic function is normal. The right ventricular size is normal. Tricuspid regurgitation signal is inadequate for assessing PA pressure. FINDINGS  Left Ventricle: Left ventricular ejection fraction, by estimation, is 60 to 65%. The left ventricle has normal function. The left ventricle has no regional wall motion abnormalities. The left ventricular internal cavity size was normal in size. There is  mild left ventricular hypertrophy. Left ventricular diastolic parameters were normal. Right Ventricle: The right ventricular size is normal. No increase in right ventricular wall thickness. Right ventricular systolic function is normal. Tricuspid regurgitation signal is inadequate for assessing PA pressure. Left Atrium: Left atrial size was normal in size. Right Atrium: Right atrial size was normal in size. Pericardium: There is no evidence of pericardial effusion. Mitral Valve: The mitral valve is normal in structure. Normal mobility of the mitral valve leaflets. Trivial mitral valve regurgitation. No evidence of mitral valve stenosis. Tricuspid Valve: The tricuspid valve is normal in structure. Tricuspid valve regurgitation is not demonstrated. No evidence of tricuspid stenosis. Aortic Valve: The aortic valve is normal in structure. Aortic valve regurgitation  is not visualized. No aortic stenosis is present. Aortic valve mean gradient measures 7.0 mmHg. Aortic valve peak gradient measures 13.5 mmHg. Aortic valve area, by VTI measures 1.48 cm. Pulmonic Valve: The pulmonic valve was normal in structure. Pulmonic valve regurgitation is not visualized. No evidence of pulmonic stenosis. Aorta: The aortic root is normal in size and structure. Venous: The inferior vena cava is normal in size with greater than 50% respiratory variability, suggesting right atrial pressure of 3 mmHg. IAS/Shunts: No atrial level shunt detected by color flow  Doppler.  LEFT VENTRICLE PLAX 2D LVIDd:         4.11 cm  Diastology LVIDs:         2.51 cm  LV e' lateral:   9.25 cm/s LV PW:         1.35 cm  LV E/e' lateral: 11.8 LV IVS:        1.17 cm  LV e' medial:    7.51 cm/s LVOT diam:     1.90 cm  LV E/e' medial:  14.5 LV SV:         65 LV SV Index:   37 LVOT Area:     2.84 cm  RIGHT VENTRICLE RV Basal diam:  2.39 cm RV S prime:     19.00 cm/s TAPSE (M-mode): 2.1 cm LEFT ATRIUM             Index       RIGHT ATRIUM           Index LA diam:        3.30 cm 1.88 cm/m  RA Area:     10.60 cm LA Vol (A2C):   41.0 ml 23.31 ml/m RA Volume:   22.90 ml  13.02 ml/m LA Vol (A4C):   40.2 ml 22.85 ml/m LA Biplane Vol: 42.6 ml 24.22 ml/m  AORTIC VALVE                    PULMONIC VALVE AV Area (Vmax):    1.59 cm     PV Vmax:       0.84 m/s AV Area (Vmean):   1.59 cm     PV Peak grad:  2.8 mmHg AV Area (VTI):     1.48 cm AV Vmax:           184.00 cm/s AV Vmean:          117.000 cm/s AV VTI:            0.441 m AV Peak Grad:      13.5 mmHg AV Mean Grad:      7.0 mmHg LVOT Vmax:         103.00 cm/s LVOT Vmean:        65.500 cm/s LVOT VTI:          0.230 m LVOT/AV VTI ratio: 0.52  AORTA Ao Root diam: 2.60 cm MITRAL VALVE MV Area (PHT): 3.12 cm     SHUNTS MV Decel Time: 243 msec     Systemic VTI:  0.23 m MV E velocity: 109.00 cm/s  Systemic Diam: 1.90 cm MV A velocity: 85.90 cm/s MV E/A ratio:  1.27 Ida Rogue MD Electronically signed by Ida Rogue MD Signature Date/Time: 09/04/2019/1:50:56 PM    Final    IR IMAGING GUIDED PORT INSERTION  Result Date: 07/20/2019 CLINICAL DATA:  Right breast carcinoma and need for porta cath for chemotherapy. EXAM: IMPLANTED PORT A CATH PLACEMENT WITH ULTRASOUND AND FLUOROSCOPIC GUIDANCE ANESTHESIA/SEDATION: 1.0  mg IV Versed; 50 mcg IV Fentanyl Total Moderate Sedation Time:  28 minutes The patient's level of consciousness and physiologic status were continuously monitored during the procedure by Radiology nursing. Additional Medications: 2 g  IV Ancef. FLUOROSCOPY TIME:  30 seconds. PROCEDURE: The procedure, risks, benefits, and alternatives were explained to the patient. Questions regarding the procedure were encouraged and answered. The patient understands and consents to the procedure. A time-out was performed prior to initiating the procedure. Ultrasound was utilized to confirm patency of the left internal jugular vein. The left neck and chest were prepped with chlorhexidine in a sterile fashion, and a sterile drape was applied covering the operative field. Maximum barrier sterile technique with sterile gowns and gloves were used for the procedure. Local anesthesia was provided with 1% lidocaine. After creating a small venotomy incision, a 21 gauge needle was advanced into the left internal jugular vein under direct, real-time ultrasound guidance. Ultrasound image documentation was performed. After securing guidewire access, an 8 Fr dilator was placed. A J-wire was kinked to measure appropriate catheter length. A subcutaneous port pocket was then created along the upper chest wall utilizing sharp and blunt dissection. Portable cautery was utilized. The pocket was irrigated with sterile saline. A single lumen power injectable port was chosen for placement. The 8 Fr catheter was tunneled from the port pocket site to the venotomy incision. The port was placed in the pocket. External catheter was trimmed to appropriate length based on guidewire measurement. At the venotomy, an 8 Fr peel-away sheath was placed over a guidewire. The catheter was then placed through the sheath and the sheath removed. Final catheter positioning was confirmed and documented with a fluoroscopic spot image. The port was accessed with a needle and aspirated and flushed with heparinized saline. The access needle was removed. The venotomy and port pocket incisions were closed with subcutaneous 3-0 Monocryl and subcuticular 4-0 Vicryl. Dermabond was applied to both incisions.  COMPLICATIONS: COMPLICATIONS None FINDINGS: After catheter placement, the tip lies at the cavo-atrial junction. The catheter aspirates normally and is ready for immediate use. IMPRESSION: Placement of single lumen port a cath via left internal jugular vein. The catheter tip lies at the cavo-atrial junction. A power injectable port a cath was placed and is ready for immediate use. Electronically Signed   By: Aletta Edouard M.D.   On: 07/20/2019 16:28   LONG TERM MONITOR (3-14 DAYS)  Result Date: 10/14/2019 Patient had a min HR of 58 bpm, max HR of 176 bpm, and avg HR of 88 bpm. Predominant underlying rhythm was Sinus Rhythm.  Occasional atrial tachycardia runs noted. . Isolated SVEs were rare (<1.0%).  No significant arrhythmias noted. Overall benign cardiac monitor.     ASSESSMENT & PLAN:  1. Malignant neoplasm of upper-outer quadrant of right female breast, unspecified estrogen receptor status ([place])   2. Encounter for antineoplastic chemotherapy   3. Antineoplastic chemotherapy induced anemia   4. Hypokalemia    cT2N0 grade 3 invasive mammary carcinoma.  ER negative, PR weakly positive (<=10%), HER-2 negative Status post 4 cycles of ddAC with growth factor support. Currently on weekly Taxol. Labs are reviewed and discussed with patient Counts acceptable to proceed with cycle 2 Taxol today l  Chemotherapy-induced anemia, Hemoglobin 8.9.  Slightly trending down.  Continue to monitor. Hypokalemia, potassium 3.3.  Recommend patient to start take oral potassium chloride 10 mEq daily. Repeat blood work in 1 week  All questions were answered. The patient knows to call the clinic with  any problems questions or concerns.   Return of visit: 1 week. Earlie Server, MD, PhD Hematology Oncology [facility] Pager- [phone] 10/18/2019

## 2019-10-18 NOTE — Progress Notes (Signed)
Patient denies problems/concerns today.   

## 2019-10-19 ENCOUNTER — Ambulatory Visit (INDEPENDENT_AMBULATORY_CARE_PROVIDER_SITE_OTHER): Payer: Medicaid Other | Admitting: Adult Health

## 2019-10-19 ENCOUNTER — Encounter: Payer: Self-pay | Admitting: Adult Health

## 2019-10-19 VITALS — BP 128/85 | HR 79 | Temp 97.3°F | Resp 16 | Ht 61.0 in | Wt 166.0 lb

## 2019-10-19 DIAGNOSIS — Z171 Estrogen receptor negative status [ER-]: Secondary | ICD-10-CM

## 2019-10-19 DIAGNOSIS — Z9119 Patient's noncompliance with other medical treatment and regimen: Secondary | ICD-10-CM | POA: Diagnosis not present

## 2019-10-19 DIAGNOSIS — I10 Essential (primary) hypertension: Secondary | ICD-10-CM | POA: Diagnosis not present

## 2019-10-19 DIAGNOSIS — C50411 Malignant neoplasm of upper-outer quadrant of right female breast: Secondary | ICD-10-CM | POA: Diagnosis not present

## 2019-10-19 DIAGNOSIS — Z91199 Patient's noncompliance with other medical treatment and regimen due to unspecified reason: Secondary | ICD-10-CM

## 2019-10-19 DIAGNOSIS — E1165 Type 2 diabetes mellitus with hyperglycemia: Secondary | ICD-10-CM

## 2019-10-19 NOTE — Progress Notes (Signed)
[facility] [place], [place] [address]  Internal MEDICINE  Office Visit Note  Patient Name: Dawn Alvarez  [id number]  [id number]  Date of Service: 10/19/2019  Chief Complaint  Patient presents with  . Hypertension  . Diabetes  . Hyperlipidemia  . Asthma  . COPD    HPI  Pt is here for follow up on blood pressure.  She is doing very well. Her blood pressure is well controlled on Amlodipine 74m only.  She Denies Chest pain, Shortness of breath, palpitations, headache, or blurred vision.  She is very pleased with her progress.  She was able to go back to the cancer center Tuesday and her treatment was able to progress now that her bp is under control. Her blood sugars are 150's in the morning before she eats.     Current Medication: Outpatient Encounter Medications as of 10/19/2019  Medication Sig  . Alcohol Swabs (B-D SINGLE USE SWABS REGULAR) PADS Use as directed twice a day  . amLODipine (NORVASC) 5 MG tablet Take one tab po qd for htn  . Blood Glucose Monitoring Suppl (TRUE METRIX AIR GLUCOSE METER) w/Device KIT 1 Device by Does not apply route in the morning and at bedtime. Use ad directed e11.65  . brimonidine (ALPHAGAN) 0.2 % ophthalmic solution Place 1 drop into both eyes 2 (two) times daily.  .Marland Kitchenglucose blood (TRUE METRIX BLOOD GLUCOSE TEST) test strip Use as instructed twice a daily diag e11.65  . insulin detemir (LEVEMIR FLEXTOUCH) 100 UNIT/ML FlexPen Inject 44 Units into the skin daily.  . Insulin Pen Needle (PEN NEEDLES) 31G X 8 MM MISC Use as directed with insulin E11.65  . Lancets Misc. (ACCU-CHEK MULTICLIX LANCET DEV) KIT by Does not apply route. Use as directed twice a day diag E11.65  . lidocaine-prilocaine (EMLA) cream   . lisinopril (ZESTRIL) 20 MG tablet Take one tab po qd for HTN  . Omega-3 Fatty Acids (FISH OIL) 1000 MG CAPS Take 1,000 mg by mouth daily.   . potassium chloride (KLOR-CON) 10 MEQ tablet Take 1 tablet (10 mEq total) by  mouth daily for 7 days.  . prochlorperazine (COMPAZINE) 10 MG tablet Take 1 tablet (10 mg total) by mouth every 6 (six) hours as needed for nausea or vomiting.  . Semaglutide, 1 MG/DOSE, (OZEMPIC, 1 MG/DOSE,) 2 MG/1.5ML SOPN Inject 1 mg into the skin once a week.  .Marland KitchenSIMBRINZA 1-0.2 % SUSP   . TRUEplus Lancets 30G MISC Use as directed twice daily diag e11.65   Facility-Administered Encounter Medications as of 10/19/2019  Medication  . sodium chloride flush (NS) 0.9 % injection 10 mL    Surgical History: Past Surgical History:  Procedure Laterality Date  . BREAST BIOPSY Right 07/05/2019   uKoreabx venus marker, grade 3 invasive mammary carcinoma  . BREAST BIOPSY Right 07/05/2019   LN bx, hydromarker,PREDOMINANTLY BLOOD AND FIBROADIPOSE TISSUE, WITH SCANT LYMPHOID TISSUE PRESENT      . CESAREAN SECTION    . COLONOSCOPY WITH PROPOFOL N/A 09/11/2015   Procedure: COLONOSCOPY WITH PROPOFOL;  Surgeon: DLucilla Lame MD;  Location: [facility] ENDOSCOPY;  Service: Endoscopy;  Laterality: N/A;  . IR IMAGING GUIDED PORT INSERTION  07/20/2019    Medical History: Past Medical History:  Diagnosis Date  . Asthma   . Cancer ([place]    breast  . [place] 04/12/[place]  . Diabetes mellitus without complication ([place]   . Hyperlipemia   . Hypertension   . Personal history of  chemotherapy     Family History: Family History  Problem Relation Age of Onset  . Diabetes Mother   . Hypertension Mother   . Hypertension Father   . Diabetes Father     Social History   Socioeconomic History  . Marital status: Married    Spouse name: Not on file  . Number of children: Not on file  . Years of education: Not on file  . Highest education level: Not on file  Occupational History  . Not on file  Tobacco Use  . Smoking status: Former Smoker    Packs/day: 1.00    Years: 2.00    Pack years: 2.00    Types: Cigarettes    Quit date: 05/19/2019    Years since quitting: 0.4  . Smokeless tobacco: Never  Used  Substance and Sexual Activity  . Alcohol use: Not Currently  . Drug use: No  . Sexual activity: Never    Birth control/protection: Abstinence  Other Topics Concern  . Not on file  Social History Narrative  . Not on file   Social Determinants of Health   Financial Resource Strain:   . Difficulty of Paying Living Expenses:   Food Insecurity:   . Worried About Charity fundraiser in the Last Year:   . Arboriculturist in the Last Year:   Transportation Needs:   . Film/video editor (Medical):   Marland Kitchen Lack of Transportation (Non-Medical):   Physical Activity:   . Days of Exercise per Week:   . Minutes of Exercise per Session:   Stress:   . Feeling of Stress :   Social Connections:   . Frequency of Communication with Friends and Family:   . Frequency of Social Gatherings with Friends and Family:   . Attends Religious Services:   . Active Member of Clubs or Organizations:   . Attends Archivist Meetings:   Marland Kitchen Marital Status:   Intimate Partner Violence:   . Fear of Current or Ex-Partner:   . Emotionally Abused:   Marland Kitchen Physically Abused:   . Sexually Abused:       Review of Systems  Constitutional: Negative for chills, fatigue and unexpected weight change.  HENT: Negative for congestion, rhinorrhea, sneezing and sore throat.   Eyes: Negative for photophobia, pain and redness.  Respiratory: Negative for cough, chest tightness and shortness of breath.   Cardiovascular: Negative for chest pain and palpitations.  Gastrointestinal: Negative for abdominal pain, constipation, diarrhea, nausea and vomiting.  Endocrine: Negative.   Genitourinary: Negative for dysuria and frequency.  Musculoskeletal: Negative for arthralgias, back pain, joint swelling and neck pain.  Skin: Negative for rash.  Allergic/Immunologic: Negative.   Neurological: Negative for tremors and numbness.  Hematological: Negative for adenopathy. Does not bruise/bleed easily.   Psychiatric/Behavioral: Negative for behavioral problems and sleep disturbance. The patient is not nervous/anxious.     Vital Signs: BP 128/85   Pulse 79   Temp (!) 97.3 F (36.3 C)   Resp 16   Ht '5\' 1"'  (1.549 m)   Wt 166 lb (75.3 kg)   SpO2 100%   BMI 31.37 kg/m    Physical Exam Vitals and nursing note reviewed.  Constitutional:      General: She is not in acute distress.    Appearance: She is well-developed. She is not diaphoretic.  HENT:     Head: Normocephalic and atraumatic.     Mouth/Throat:     Pharynx: No oropharyngeal exudate.  Eyes:  Pupils: Pupils are equal, round, and reactive to light.  Neck:     Thyroid: No thyromegaly.     Vascular: No JVD.     Trachea: No tracheal deviation.  Cardiovascular:     Rate and Rhythm: Normal rate and regular rhythm.     Heart sounds: Normal heart sounds. No murmur. No friction rub. No gallop.   Pulmonary:     Effort: Pulmonary effort is normal. No respiratory distress.     Breath sounds: Normal breath sounds. No wheezing or rales.  Chest:     Chest wall: No tenderness.  Abdominal:     Palpations: Abdomen is soft.     Tenderness: There is no abdominal tenderness. There is no guarding.  Musculoskeletal:        General: Normal range of motion.     Cervical back: Normal range of motion and neck supple.  Lymphadenopathy:     Cervical: No cervical adenopathy.  Skin:    General: Skin is warm and dry.  Neurological:     Mental Status: She is alert and oriented to person, place, and time.     Cranial Nerves: No cranial nerve deficit.  Psychiatric:        Behavior: Behavior normal.        Thought Content: Thought content normal.        Judgment: Judgment normal.    Assessment/Plan: 1. Essential hypertension BP is improved, today 128/85, continue current medications and continue to monitor.   2. Uncontrolled type 2 diabetes mellitus with hyperglycemia ([place]) Continue current management.   3. Malignant neoplasm of  upper-outer quadrant of right breast in female, estrogen receptor negative ([place]) Continue to follow up with oncology as scheduled.   4. Compliance poor Encouraged patient to take medications as directed.   General Counseling: Keith verbalizes understanding of the findings of todays visit and agrees with plan of treatment. I have discussed any further diagnostic evaluation that may be needed or ordered today. We also reviewed her medications today. she has been encouraged to call the office with any questions or concerns that should arise related to todays visit.    No orders of the defined types were placed in this encounter.   No orders of the defined types were placed in this encounter.   Time spent: 30 Minutes   This patient was seen by Orson Gear AGNP-C in Collaboration with Dr Lavera Guise as a part of collaborative care agreement     Kendell Bane AGNP-C Internal medicine

## 2019-10-24 ENCOUNTER — Encounter: Payer: Self-pay | Admitting: Oncology

## 2019-10-24 NOTE — Progress Notes (Signed)
Patient called/ pre- screened for appoinment with oncologist. No concerns or complaints at this time. 

## 2019-10-25 ENCOUNTER — Inpatient Hospital Stay: Payer: Medicare HMO

## 2019-10-25 ENCOUNTER — Inpatient Hospital Stay (HOSPITAL_BASED_OUTPATIENT_CLINIC_OR_DEPARTMENT_OTHER): Payer: Medicare HMO | Admitting: Oncology

## 2019-10-25 ENCOUNTER — Other Ambulatory Visit: Payer: Self-pay

## 2019-10-25 VITALS — BP 109/80 | HR 90 | Temp 95.5°F | Resp 18 | Wt 161.7 lb

## 2019-10-25 DIAGNOSIS — T451X5A Adverse effect of antineoplastic and immunosuppressive drugs, initial encounter: Secondary | ICD-10-CM

## 2019-10-25 DIAGNOSIS — E119 Type 2 diabetes mellitus without complications: Secondary | ICD-10-CM

## 2019-10-25 DIAGNOSIS — Z794 Long term (current) use of insulin: Secondary | ICD-10-CM | POA: Diagnosis not present

## 2019-10-25 DIAGNOSIS — Z79899 Other long term (current) drug therapy: Secondary | ICD-10-CM | POA: Diagnosis not present

## 2019-10-25 DIAGNOSIS — Z171 Estrogen receptor negative status [ER-]: Secondary | ICD-10-CM | POA: Diagnosis not present

## 2019-10-25 DIAGNOSIS — E876 Hypokalemia: Secondary | ICD-10-CM

## 2019-10-25 DIAGNOSIS — I951 Orthostatic hypotension: Secondary | ICD-10-CM | POA: Diagnosis not present

## 2019-10-25 DIAGNOSIS — C50411 Malignant neoplasm of upper-outer quadrant of right female breast: Secondary | ICD-10-CM

## 2019-10-25 DIAGNOSIS — Z5111 Encounter for antineoplastic chemotherapy: Secondary | ICD-10-CM | POA: Diagnosis not present

## 2019-10-25 DIAGNOSIS — D6481 Anemia due to antineoplastic chemotherapy: Secondary | ICD-10-CM

## 2019-10-25 DIAGNOSIS — Z87891 Personal history of nicotine dependence: Secondary | ICD-10-CM | POA: Diagnosis not present

## 2019-10-25 LAB — CBC WITH DIFFERENTIAL/PLATELET
Abs Immature Granulocytes: 0.02 10*3/uL (ref 0.00–0.07)
Basophils Absolute: 0.1 10*3/uL (ref 0.0–0.1)
Basophils Relative: 1 %
Eosinophils Absolute: 0.3 10*3/uL (ref 0.0–0.5)
Eosinophils Relative: 6 %
HCT: 28.1 % — ABNORMAL LOW (ref 36.0–46.0)
Hemoglobin: 9.5 g/dL — ABNORMAL LOW (ref 12.0–15.0)
Immature Granulocytes: 0 %
Lymphocytes Relative: 24 %
Lymphs Abs: 1.1 10*3/uL (ref 0.7–4.0)
MCH: 29.6 pg (ref 26.0–34.0)
MCHC: 33.8 g/dL (ref 30.0–36.0)
MCV: 87.5 fL (ref 80.0–100.0)
Monocytes Absolute: 0.4 10*3/uL (ref 0.1–1.0)
Monocytes Relative: 8 %
Neutro Abs: 3 10*3/uL (ref 1.7–7.7)
Neutrophils Relative %: 61 %
Platelets: 268 10*3/uL (ref 150–400)
RBC: 3.21 MIL/uL — ABNORMAL LOW (ref 3.87–5.11)
RDW: 18.2 % — ABNORMAL HIGH (ref 11.5–15.5)
WBC: 4.8 10*3/uL (ref 4.0–10.5)
nRBC: 0 % (ref 0.0–0.2)

## 2019-10-25 LAB — COMPREHENSIVE METABOLIC PANEL
ALT: 16 U/L (ref 0–44)
AST: 22 U/L (ref 15–41)
Albumin: 3.8 g/dL (ref 3.5–5.0)
Alkaline Phosphatase: 28 U/L — ABNORMAL LOW (ref 38–126)
Anion gap: 8 (ref 5–15)
BUN: 11 mg/dL (ref 8–23)
CO2: 25 mmol/L (ref 22–32)
Calcium: 9 mg/dL (ref 8.9–10.3)
Chloride: 106 mmol/L (ref 98–111)
Creatinine, Ser: 0.82 mg/dL (ref 0.44–1.00)
GFR calc Af Amer: >60 mL/min (ref >60)
GFR calc non Af Amer: >60 mL/min (ref >60)
Glucose, Bld: 188 mg/dL — ABNORMAL HIGH (ref 70–99)
Potassium: 3.8 mmol/L (ref 3.5–5.1)
Sodium: 139 mmol/L (ref 135–145)
Total Bilirubin: 0.5 mg/dL (ref 0.3–1.2)
Total Protein: 6.9 g/dL (ref 6.5–8.1)

## 2019-10-25 LAB — MAGNESIUM: Magnesium: 1.9 mg/dL (ref 1.7–2.4)

## 2019-10-25 MED ORDER — FAMOTIDINE IN NACL 20-0.9 MG/50ML-% IV SOLN
20.0000 mg | Freq: Once | INTRAVENOUS | Status: AC
  Administered 2019-10-25: 20 mg via INTRAVENOUS
  Filled 2019-10-25: qty 50

## 2019-10-25 MED ORDER — DIPHENHYDRAMINE HCL 50 MG/ML IJ SOLN
50.0000 mg | Freq: Once | INTRAMUSCULAR | Status: AC
  Administered 2019-10-25: 50 mg via INTRAVENOUS
  Filled 2019-10-25: qty 1

## 2019-10-25 MED ORDER — SODIUM CHLORIDE 0.9 % IV SOLN
80.0000 mg/m2 | Freq: Once | INTRAVENOUS | Status: AC
  Administered 2019-10-25: 150 mg via INTRAVENOUS
  Filled 2019-10-25: qty 25

## 2019-10-25 MED ORDER — SODIUM CHLORIDE 0.9 % IV SOLN
Freq: Once | INTRAVENOUS | Status: AC
  Filled 2019-10-25: qty 250

## 2019-10-25 MED ORDER — HEPARIN SOD (PORK) LOCK FLUSH 100 UNIT/ML IV SOLN
500.0000 [IU] | Freq: Once | INTRAVENOUS | Status: AC | PRN
  Administered 2019-10-25: 500 [IU]
  Filled 2019-10-25: qty 5

## 2019-10-25 MED ORDER — SODIUM CHLORIDE 0.9 % IV SOLN
10.0000 mg | Freq: Once | INTRAVENOUS | Status: AC
  Administered 2019-10-25: 10 mg via INTRAVENOUS
  Filled 2019-10-25: qty 10

## 2019-10-25 NOTE — Progress Notes (Signed)
Hematology/Oncology follow up note [facility] [phone] [phone]   Patient Care Team: Lavera Guise, MD as PCP - General (Internal Medicine) Kate Sable, MD as PCP - Cardiology (Cardiology) Rico Junker, RN as Registered Nurse Theodore Demark, RN as Registered Nurse  REFERRING PROVIDER: Lavera Guise, MD  CHIEF COMPLAINTS/REASON FOR VISIT:  Follow-up for breast cancer  HISTORY OF PRESENTING ILLNESS:   Dawn Alvarez is a  64 y.o.  female with PMH listed below was seen in consultation at the request of  Lavera Guise, MD  for evaluation of breast cancer Patient had screening mammogram done 08/12/2017 which showed right breast asymmetry.  A diagnostic mammogram was suggested and patient did not have it done. Patient felt her right breast mass for a few weeks.  She had bilateral diagnostic mammogram done on 06/30/2019. 2.8 x 2.4 x 2.6 cm right breast mass, 11:00, 10 cm from the nipple.  There is a single mildly abnormal node in the right axilla with a cortex measuring up to 4.4 mm.  No other suspicious findings. Patient underwent ultrasound-guided core biopsy of the right breast mass and right axilla lymph node Pathology showed invasive mammary carcinoma, no special type, grade 3, ER/PR HER-2 status are pending. Right axillary lymph node biopsy showed predominantly blood in the fibroadipose tissue, with scant lymphoid tissue present.  No definite malignancy was identified.  Patient was referred to cancer center to establish care and discuss treatment plan. Menarche 72 Postmenopausal.  LMP when she was 64 years old. She recalls use of birth control pills. Denies any hormone .  Replacement therapy. Denies any prior chest radiation. She reports family history of maternal grandmother and 2 maternal cousins were diagnosed with cancer.  She does not know about details. # 09/03/2019, patient had an episode of syncope and EMS was called  and patient sent to emergency room.  Patient was noted to have a heart rate 45-65 and blood glucose level of 286. Patient denies any history of nausea, vomiting, diarrhea.  Denies any dehydration.  She reported history of intermittent abdominal pain. CT head without contrast showed no acute intracranial abnormality.  CT abdomen pelvis with contrast is negative for evidence of acute abdominal abnormality.  Patient was given IV fluid. Patient was observed with holding her blood pressure medication including amlodipine, bisoprolol.  No arrhythmia was noted on telemetry.  Echocardiogram showed LVEF 60 to 65%.  No valvular abnormalities.  Beta-blocker and diuretics was discontinued.  # Patient was seen by cardiology Dr. Garen Lah for evaluation of syncope and was cleared for chemotherapy. # 10/03/2019, interval unilateral right diagnostic mammogram showed right breast mass 11:00 size has decreased to 3.7 x 1.3 x 2.6 cm.  Previously mass measured 2.8 x 2.4 x 2.6 cm  INTERVAL HISTORY Dawn Alvarez is a 64 y.o. female who has above history reviewed by me today presents for follow up visit for management of breast cancer, evaluation prior to chemotherapy. Problems and complaints are listed below: Patient denies no particular concerns or side effects after last Taxol treatments. She has no new complaints today Denies any additional lightheaded episodes Denies any numbness or tingling sensation   Review of Systems  Constitutional: Negative for appetite change, chills, fatigue and fever.  HENT:   Negative for hearing loss and voice change.   Eyes: Negative for eye problems.  Respiratory: Negative for chest tightness and cough.   Cardiovascular: Negative for chest pain.  Gastrointestinal: Negative for abdominal distention,  abdominal pain and blood in stool.  Endocrine: Negative for hot flashes.  Genitourinary: Negative for difficulty urinating and frequency.   Musculoskeletal: Negative for  arthralgias.  Skin: Negative for itching and rash.  Neurological: Negative for extremity weakness.  Hematological: Negative for adenopathy.  Psychiatric/Behavioral: Negative for confusion.    MEDICAL HISTORY:  Past Medical History:  Diagnosis Date  . Asthma   . Cancer ([place])    breast  . COPD exacerbation ([place]) 04/12/2016  . Diabetes mellitus without complication ([place])   . Hyperlipemia   . Hypertension   . Personal history of chemotherapy     SURGICAL HISTORY: Past Surgical History:  Procedure Laterality Date  . BREAST BIOPSY Right 07/05/2019   [place] bx venus marker, grade 3 invasive mammary carcinoma  . BREAST BIOPSY Right 07/05/2019   LN bx, hydromarker,PREDOMINANTLY BLOOD AND FIBROADIPOSE TISSUE, WITH SCANT LYMPHOID TISSUE PRESENT      . CESAREAN SECTION    . COLONOSCOPY WITH PROPOFOL N/A 09/11/2015   Procedure: COLONOSCOPY WITH PROPOFOL;  Surgeon: Lucilla Lame, MD;  Location: [facility] ENDOSCOPY;  Service: Endoscopy;  Laterality: N/A;  . IR IMAGING GUIDED PORT INSERTION  07/20/2019    SOCIAL HISTORY: Social History   Socioeconomic History  . Marital status: Married    Spouse name: Not on file  . Number of children: Not on file  . Years of education: Not on file  . Highest education level: Not on file  Occupational History  . Not on file  Tobacco Use  . Smoking status: Former Smoker    Packs/day: 1.00    Years: 2.00    Pack years: 2.00    Types: Cigarettes    Quit date: 05/19/2019    Years since quitting: 0.4  . Smokeless tobacco: Never Used  Substance and Sexual Activity  . Alcohol use: Not Currently  . Drug use: No  . Sexual activity: Never    Birth control/protection: Abstinence  Other Topics Concern  . Not on file  Social History Narrative  . Not on file   Social Determinants of Health   Financial Resource Strain:   . Difficulty of Paying Living Expenses:   Food Insecurity:   . Worried About Charity fundraiser in the Last Year:   . Arboriculturist in  the Last Year:   Transportation Needs:   . Film/video editor (Medical):   Marland Kitchen Lack of Transportation (Non-Medical):   Physical Activity:   . Days of Exercise per Week:   . Minutes of Exercise per Session:   Stress:   . Feeling of Stress :   Social Connections:   . Frequency of Communication with Friends and Family:   . Frequency of Social Gatherings with Friends and Family:   . Attends Religious Services:   . Active Member of Clubs or Organizations:   . Attends Archivist Meetings:   Marland Kitchen Marital Status:   Intimate Partner Violence:   . Fear of Current or Ex-Partner:   . Emotionally Abused:   Marland Kitchen Physically Abused:   . Sexually Abused:     FAMILY HISTORY: Family History  Problem Relation Age of Onset  . Diabetes Mother   . Hypertension Mother   . Hypertension Father   . Diabetes Father     ALLERGIES:  has No Known Allergies.  MEDICATIONS:  Current Outpatient Medications  Medication Sig Dispense Refill  . Alcohol Swabs (B-D SINGLE USE SWABS REGULAR) PADS Use as directed twice a day 300 each 3  .  amLODipine (NORVASC) 5 MG tablet Take one tab po qd for htn 30 tablet 3  . Blood Glucose Monitoring Suppl (TRUE METRIX AIR GLUCOSE METER) w/Device KIT 1 Device by Does not apply route in the morning and at bedtime. Use ad directed e11.65 1 kit 0  . brimonidine (ALPHAGAN) 0.2 % ophthalmic solution Place 1 drop into both eyes 2 (two) times daily.    Marland Kitchen glucose blood (TRUE METRIX BLOOD GLUCOSE TEST) test strip Use as instructed twice a daily diag e11.65 100 each 3  . insulin detemir (LEVEMIR FLEXTOUCH) 100 UNIT/ML FlexPen Inject 44 Units into the skin daily. 15 mL 3  . Insulin Pen Needle (PEN NEEDLES) 31G X 8 MM MISC Use as directed with insulin E11.65 100 each 1  . Lancets Misc. (ACCU-CHEK MULTICLIX LANCET DEV) KIT by Does not apply route. Use as directed twice a day diag E11.65    . lidocaine-prilocaine (EMLA) cream     . Omega-3 Fatty Acids (FISH OIL) 1000 MG CAPS Take  1,000 mg by mouth daily.     . potassium chloride (KLOR-CON) 10 MEQ tablet Take 1 tablet (10 mEq total) by mouth daily for 7 days. 7 tablet 0  . prochlorperazine (COMPAZINE) 10 MG tablet Take 1 tablet (10 mg total) by mouth every 6 (six) hours as needed for nausea or vomiting. 60 tablet 1  . Semaglutide, 1 MG/DOSE, (OZEMPIC, 1 MG/DOSE,) 2 MG/1.5ML SOPN Inject 1 mg into the skin once a week. 5 pen 3  . SIMBRINZA 1-0.2 % SUSP     . TRUEplus Lancets 30G MISC Use as directed twice daily diag e11.65 100 each 3   No current facility-administered medications for this visit.   Facility-Administered Medications Ordered in Other Visits  Medication Dose Route Frequency Provider Last Rate Last Admin  . sodium chloride flush (NS) 0.9 % injection 10 mL  10 mL Intravenous PRN Earlie Server, MD   10 mL at 10/11/19 0817     PHYSICAL EXAMINATION: ECOG PERFORMANCE STATUS: 1 - Symptomatic but completely ambulatory Vitals:   10/25/19 0854  BP: 109/80  Pulse: 90  Resp: 18  Temp: (!) 95.5 F (35.3 C)   Filed Weights   10/25/19 0854  Weight: 161 lb 11.2 oz (73.3 kg)    Physical Exam Constitutional:      General: She is not in acute distress. HENT:     Head: Normocephalic and atraumatic.  Eyes:     General: No scleral icterus. Cardiovascular:     Rate and Rhythm: Normal rate and regular rhythm.     Heart sounds: Normal heart sounds.  Pulmonary:     Effort: Pulmonary effort is normal. No respiratory distress.     Breath sounds: No wheezing.  Abdominal:     General: Bowel sounds are normal. There is no distension.     Palpations: Abdomen is soft.  Musculoskeletal:        General: No deformity. Normal range of motion.     Cervical back: Normal range of motion and neck supple.  Skin:    General: Skin is warm and dry.     Findings: No erythema or rash.  Neurological:     Mental Status: She is alert and oriented to person, place, and time. Mental status is at baseline.     Cranial Nerves: No  cranial nerve deficit.     Coordination: Coordination normal.  Psychiatric:        Mood and Affect: Mood normal.      LABORATORY  DATA:  I have reviewed the data as listed Lab Results  Component Value Date   WBC 6.0 10/18/2019   HGB 8.9 (L) 10/18/2019   HCT 26.7 (L) 10/18/2019   MCV 87.0 10/18/2019   PLT 278 10/18/2019   Recent Labs    10/06/19 0807 10/11/19 0812 10/18/19 0801  NA 138 136 137  K 3.7 3.7 3.3*  CL 104 104 107  CO2 _0 GLUCOSE 152* 131* 241*  BUN 10 7* 15  CREATININE 0.99 0.80 0.80  CALCIUM 9.1 8.7* 8.9  GFRNONAA >60 >60 >60  GFRAA >60 >60 >60  PROT 7.1 6.7 6.6  ALBUMIN 3.9 3.5 3.4*  AST _1 ALT _2 ALKPHOS 47 38 30*  BILITOT 0.5 0.5 0.4   Iron/TIBC/Ferritin/ %Sat No results found for: IRON, TIBC, FERRITIN, IRONPCTSAT    RADIOGRAPHIC STUDIES: I have personally reviewed the radiological images as listed and agreed with the findings in the report. CT Head Wo Contrast  Result Date: 09/03/2019 CLINICAL DATA:  Encephalopathy. EXAM: CT HEAD WITHOUT CONTRAST TECHNIQUE: Contiguous axial images were obtained from the base of the skull through the vertex without intravenous contrast. COMPARISON:  Head CT 07/30/2019 FINDINGS: Brain: Brain volume is normal for age. No intracranial hemorrhage, mass effect, or midline shift. No hydrocephalus. The basilar cisterns are patent. Unchanged periventricular and deep chronic small vessel ischemia. Punctate lacunar infarct in the right thalamus. No evidence of territorial infarct or acute ischemia. No extra-axial or intracranial fluid collection. Vascular: No hyperdense vessel. Skull: Normal. Negative for fracture or focal lesion. Sinuses/Orbits: Paranasal sinuses and mastoid air cells are clear. The visualized orbits are unremarkable. Other: None. IMPRESSION: 1. No acute intracranial abnormality. 2. Stable chronic small vessel ischemia from prior. Electronically Signed   By: Keith Rake M.D.   On:  09/03/2019 19:53   CT Head Wo Contrast  Result Date: 07/30/2019 CLINICAL DATA:  64 year old female with altered mental status EXAM: CT HEAD WITHOUT CONTRAST TECHNIQUE: Contiguous axial images were obtained from the base of the skull through the vertex without intravenous contrast. COMPARISON:  MR 05/04/2010, CT 12/28/2018 FINDINGS: Brain: No acute intracranial hemorrhage. No midline shift or mass effect. Gray-white differentiation maintained. Confluent hypodensity in the periventricular white matter, similar to the prior MR and CT. Unremarkable appearance of the ventricular system. Vascular: Mild atherosclerotic calcifications. Skull: No acute fracture.  No aggressive bone lesion identified. Sinuses/Orbits: Unremarkable appearance of the orbits. Mastoid air cells clear. No middle ear effusion. No significant sinus disease. Other: None IMPRESSION: Negative for acute intracranial abnormality. Chronic microvascular ischemic disease. Electronically Signed   By: Corrie Mckusick D.O.   On: 07/30/2019 14:46   CT ABDOMEN PELVIS W CONTRAST  Result Date: 09/03/2019 CLINICAL DATA:  Nausea vomiting. EXAM: CT ABDOMEN AND PELVIS WITH CONTRAST TECHNIQUE: Multidetector CT imaging of the abdomen and pelvis was performed using the standard protocol following bolus administration of intravenous contrast. CONTRAST:  137m OMNIPAQUE IOHEXOL 300 MG/ML  SOLN COMPARISON:  April 20, 2016 FINDINGS: Lower chest: No acute abnormality. Hepatobiliary: No focal liver abnormality is seen. No gallstones, gallbladder wall thickening, or biliary dilatation. Pancreas: Unremarkable. No pancreatic ductal dilatation or surrounding inflammatory changes. Spleen: Normal in size without focal abnormality. Adrenals/Urinary Tract: Adrenal glands are unremarkable. Kidneys are normal, without renal calculi, solid lesion, or hydronephrosis. 2 cm right renal cyst. Bladder is unremarkable. Stomach/Bowel: Stomach is within normal limits. Appendix appears  normal. No evidence of bowel wall thickening, distention, or inflammatory changes. Vascular/Lymphatic:  Aortic atherosclerosis. No enlarged abdominal or pelvic lymph nodes. Reproductive: Numerous some calcified myometrial masses. Other: No abdominal wall hernia or abnormality. No abdominopelvic ascites. Musculoskeletal: L4-L5 spondylosis. IMPRESSION: 1. No evidence of acute abnormalities within the solid abdominal organs. 2. Myometrial fibroids. Aortic Atherosclerosis (ICD10-I70.0). Electronically Signed   By: Fidela Salisbury M.D.   On: 09/03/2019 19:54   US Breast Limited Uni Right Inc Axilla  Result Date: 10/03/2019 CLINICAL DATA:  Ultrasound-guided core biopsy of mass in the RIGHT breast 10-11 o'clock location performed 07/05/2019, showing grade 3 invasive mammary carcinoma. Ultrasound-guided core biopsy of RIGHT axillary lymph node was benign and concordant. Patient has undergone neoadjuvant treatment scans feels the mass has gotten smaller. Assess for response to treatment. EXAM: DIGITAL DIAGNOSTIC RIGHT MAMMOGRAM WITH CAD AND TOMO ULTRASOUND RIGHT BREAST COMPARISON:  07/05/2019 and earlier ACR Breast Density Category b: There are scattered areas of fibroglandular density. FINDINGS: Within the UPPER-OUTER QUADRANT of the RIGHT breast there is a spiculated mass, marked with a tissue marking clip. Lesion appears smaller compared to prior study. No new suspicious findings in the RIGHT breast. Mammographic images were processed with CAD. Targeted ultrasound is performed, showingirregular hypoechoic mass in the 11 o'clock location of the RIGHT breast 10 centimeters from the nipple, measuring 2.7 x 1.3 x 2.6 centimeters. Previously mass measured 2.8 x 2.4 x 2.6 centimeters. IMPRESSION: Smaller mass in the 11 o'clock location of the RIGHT breast. RECOMMENDATION: Treatment plan for known RIGHT breast cancer. I have discussed the findings and recommendations with the patient. If applicable, a reminder letter will  be sent to the patient regarding the next appointment. BI-RADS CATEGORY  6: Known biopsy-proven malignancy. Electronically Signed   By: Nolon Nations M.D.   On: 10/03/2019 14:36   MM DIAG BREAST TOMO UNI RIGHT  Result Date: 10/03/2019 CLINICAL DATA:  Ultrasound-guided core biopsy of mass in the RIGHT breast 10-11 o'clock location performed 07/05/2019, showing grade 3 invasive mammary carcinoma. Ultrasound-guided core biopsy of RIGHT axillary lymph node was benign and concordant. Patient has undergone neoadjuvant treatment scans feels the mass has gotten smaller. Assess for response to treatment. EXAM: DIGITAL DIAGNOSTIC RIGHT MAMMOGRAM WITH CAD AND TOMO ULTRASOUND RIGHT BREAST COMPARISON:  07/05/2019 and earlier ACR Breast Density Category b: There are scattered areas of fibroglandular density. FINDINGS: Within the UPPER-OUTER QUADRANT of the RIGHT breast there is a spiculated mass, marked with a tissue marking clip. Lesion appears smaller compared to prior study. No new suspicious findings in the RIGHT breast. Mammographic images were processed with CAD. Targeted ultrasound is performed, showingirregular hypoechoic mass in the 11 o'clock location of the RIGHT breast 10 centimeters from the nipple, measuring 2.7 x 1.3 x 2.6 centimeters. Previously mass measured 2.8 x 2.4 x 2.6 centimeters. IMPRESSION: Smaller mass in the 11 o'clock location of the RIGHT breast. RECOMMENDATION: Treatment plan for known RIGHT breast cancer. I have discussed the findings and recommendations with the patient. If applicable, a reminder letter will be sent to the patient regarding the next appointment. BI-RADS CATEGORY  6: Known biopsy-proven malignancy. Electronically Signed   By: Nolon Nations M.D.   On: 10/03/2019 14:36   ECHOCARDIOGRAM COMPLETE  Result Date: 09/04/2019    ECHOCARDIOGRAM REPORT   Patient Name:   Dawn Alvarez Date of Exam: 09/04/2019 Medical Rec #:  [id number]        Height:       61.0 in Accession #:     [id number]       Weight:  169.2 lb Date of Birth:  1955-12-31        BSA:          1.759 m Patient Age:    27 years         BP:           175/83 mmHg Patient Gender: F                HR:           67 bpm. Exam Location:  [facility] Procedure: 2D Echo Indications:     Syncope 780.2/ R55  History:         Patient has no prior history of Echocardiogram examinations.  Sonographer:     Arville Go RDCS Referring Phys:  [place] Diagnosing Phys: Ida Rogue MD IMPRESSIONS  1. Left ventricular ejection fraction, by estimation, is 60 to 65%. The left ventricle has normal function. The left ventricle has no regional wall motion abnormalities. There is mild left ventricular hypertrophy. Left ventricular diastolic parameters were normal.  2. Right ventricular systolic function is normal. The right ventricular size is normal. Tricuspid regurgitation signal is inadequate for assessing PA pressure. FINDINGS  Left Ventricle: Left ventricular ejection fraction, by estimation, is 60 to 65%. The left ventricle has normal function. The left ventricle has no regional wall motion abnormalities. The left ventricular internal cavity size was normal in size. There is  mild left ventricular hypertrophy. Left ventricular diastolic parameters were normal. Right Ventricle: The right ventricular size is normal. No increase in right ventricular wall thickness. Right ventricular systolic function is normal. Tricuspid regurgitation signal is inadequate for assessing PA pressure. Left Atrium: Left atrial size was normal in size. Right Atrium: Right atrial size was normal in size. Pericardium: There is no evidence of pericardial effusion. Mitral Valve: The mitral valve is normal in structure. Normal mobility of the mitral valve leaflets. Trivial mitral valve regurgitation. No evidence of mitral valve stenosis. Tricuspid Valve: The tricuspid valve is normal in structure. Tricuspid valve regurgitation is not demonstrated. No evidence of  tricuspid stenosis. Aortic Valve: The aortic valve is normal in structure. Aortic valve regurgitation is not visualized. No aortic stenosis is present. Aortic valve mean gradient measures 7.0 mmHg. Aortic valve peak gradient measures 13.5 mmHg. Aortic valve area, by VTI measures 1.48 cm. Pulmonic Valve: The pulmonic valve was normal in structure. Pulmonic valve regurgitation is not visualized. No evidence of pulmonic stenosis. Aorta: The aortic root is normal in size and structure. Venous: The inferior vena cava is normal in size with greater than 50% respiratory variability, suggesting right atrial pressure of 3 mmHg. IAS/Shunts: No atrial level shunt detected by color flow Doppler.  LEFT VENTRICLE PLAX 2D LVIDd:         4.11 cm  Diastology LVIDs:         2.51 cm  LV e' lateral:   9.25 cm/s LV PW:         1.35 cm  LV E/e' lateral: 11.8 LV IVS:        1.17 cm  LV e' medial:    7.51 cm/s LVOT diam:     1.90 cm  LV E/e' medial:  14.5 LV SV:         65 LV SV Index:   37 LVOT Area:     2.84 cm  RIGHT VENTRICLE RV Basal diam:  2.39 cm RV S prime:     19.00 cm/s TAPSE (M-mode): 2.1 cm LEFT ATRIUM  Index       RIGHT ATRIUM           Index LA diam:        3.30 cm 1.88 cm/m  RA Area:     10.60 cm LA Vol (A2C):   41.0 ml 23.31 ml/m RA Volume:   22.90 ml  13.02 ml/m LA Vol (A4C):   40.2 ml 22.85 ml/m LA Biplane Vol: 42.6 ml 24.22 ml/m  AORTIC VALVE                    PULMONIC VALVE AV Area (Vmax):    1.59 cm     PV Vmax:       0.84 m/s AV Area (Vmean):   1.59 cm     PV Peak grad:  2.8 mmHg AV Area (VTI):     1.48 cm AV Vmax:           184.00 cm/s AV Vmean:          117.000 cm/s AV VTI:            0.441 m AV Peak Grad:      13.5 mmHg AV Mean Grad:      7.0 mmHg LVOT Vmax:         103.00 cm/s LVOT Vmean:        65.500 cm/s LVOT VTI:          0.230 m LVOT/AV VTI ratio: 0.52  AORTA Ao Root diam: 2.60 cm MITRAL VALVE MV Area (PHT): 3.12 cm     SHUNTS MV Decel Time: 243 msec     Systemic VTI:  0.23 m MV E  velocity: 109.00 cm/s  Systemic Diam: 1.90 cm MV A velocity: 85.90 cm/s MV E/A ratio:  1.27 Ida Rogue MD Electronically signed by Ida Rogue MD Signature Date/Time: 09/04/2019/1:50:56 PM    Final    LONG TERM MONITOR (3-14 DAYS)  Result Date: 10/14/2019 Patient had a min HR of 58 bpm, max HR of 176 bpm, and avg HR of 88 bpm. Predominant underlying rhythm was Sinus Rhythm.  Occasional atrial tachycardia runs noted. . Isolated SVEs were rare (<1.0%).  No significant arrhythmias noted. Overall benign cardiac monitor.     ASSESSMENT & PLAN:  1. Malignant neoplasm of upper-outer quadrant of right female breast, unspecified estrogen receptor status ([place])   2. Encounter for antineoplastic chemotherapy   3. Antineoplastic chemotherapy induced anemia   4. Type 2 diabetes mellitus without complication, with long-term current use of insulin ([facility])    cT2N0 grade 3 invasive mammary carcinoma.  ER negative, PR weakly positive (<=10%), HER-2 negative Status post 4 cycles of ddAC with growth factor support. Patient is currently on weekly Taxol. She tolerates well. No signs of neuropathy. Labs are reviewed and discussed with patient. Okay to proceed with Taxol treatments today.  Chemotherapy-induced anemia, hemoglobin Is at 9.5 Hypokalemia, potassium has normalized. Diabetes, glucose level at 188.  I discussed with patient about diabetic diet. Patient is on Ozempic, insulin.  Continue follow-up with primary care provider Repeat blood work in 1 week  All questions were answered. The patient knows to call the clinic with any problems questions or concerns.   Return of visit: 1 week. Earlie Server, MD, PhD Hematology Oncology [facility] at [facility] Pager- [phone] 10/25/2019

## 2019-10-25 NOTE — Progress Notes (Signed)
Pt here for follow up. No new concerns voiced.   

## 2019-10-27 NOTE — Progress Notes (Signed)
Pharmacist Chemotherapy Monitoring - Follow Up Assessment    I verify that I have reviewed each item in the below checklist:  . Regimen for the patient is scheduled for the appropriate day and plan matches scheduled date. Marland Kitchen Appropriate non-routine labs are ordered dependent on drug ordered. . If applicable, additional medications reviewed and ordered per protocol based on lifetime cumulative doses and/or treatment regimen.   Plan for follow-up and/or issues identified: No . I-vent associated with next due treatment: No . MD and/or nursing notified: No  Maylon Sailors K 10/27/2019 9:15 AM

## 2019-11-02 MED ORDER — SODIUM CHLORIDE 0.9% FLUSH
10.0000 mL | INTRAVENOUS | Status: DC | PRN
  Administered 2019-11-03: 10 mL via INTRAVENOUS
  Filled 2019-11-02: qty 10

## 2019-11-03 ENCOUNTER — Other Ambulatory Visit: Payer: Self-pay

## 2019-11-03 ENCOUNTER — Inpatient Hospital Stay: Payer: Medicare HMO | Attending: Oncology

## 2019-11-03 ENCOUNTER — Inpatient Hospital Stay (HOSPITAL_BASED_OUTPATIENT_CLINIC_OR_DEPARTMENT_OTHER): Payer: Medicare HMO | Admitting: Oncology

## 2019-11-03 ENCOUNTER — Encounter: Payer: Self-pay | Admitting: Oncology

## 2019-11-03 ENCOUNTER — Inpatient Hospital Stay: Payer: Medicare HMO

## 2019-11-03 VITALS — BP 137/74 | HR 78 | Temp 97.6°F | Resp 16 | Wt 165.5 lb

## 2019-11-03 DIAGNOSIS — E876 Hypokalemia: Secondary | ICD-10-CM | POA: Insufficient documentation

## 2019-11-03 DIAGNOSIS — D6481 Anemia due to antineoplastic chemotherapy: Secondary | ICD-10-CM | POA: Diagnosis not present

## 2019-11-03 DIAGNOSIS — T451X5A Adverse effect of antineoplastic and immunosuppressive drugs, initial encounter: Secondary | ICD-10-CM | POA: Diagnosis not present

## 2019-11-03 DIAGNOSIS — C50411 Malignant neoplasm of upper-outer quadrant of right female breast: Secondary | ICD-10-CM

## 2019-11-03 DIAGNOSIS — Z5111 Encounter for antineoplastic chemotherapy: Secondary | ICD-10-CM | POA: Diagnosis not present

## 2019-11-03 DIAGNOSIS — Z79899 Other long term (current) drug therapy: Secondary | ICD-10-CM | POA: Insufficient documentation

## 2019-11-03 DIAGNOSIS — Z006 Encounter for examination for normal comparison and control in clinical research program: Secondary | ICD-10-CM | POA: Insufficient documentation

## 2019-11-03 DIAGNOSIS — Z95828 Presence of other vascular implants and grafts: Secondary | ICD-10-CM

## 2019-11-03 DIAGNOSIS — Z171 Estrogen receptor negative status [ER-]: Secondary | ICD-10-CM | POA: Diagnosis not present

## 2019-11-03 DIAGNOSIS — E860 Dehydration: Secondary | ICD-10-CM

## 2019-11-03 LAB — COMPREHENSIVE METABOLIC PANEL
ALT: 14 U/L (ref 0–44)
AST: 18 U/L (ref 15–41)
Albumin: 3.5 g/dL (ref 3.5–5.0)
Alkaline Phosphatase: 25 U/L — ABNORMAL LOW (ref 38–126)
Anion gap: 8 (ref 5–15)
BUN: 10 mg/dL (ref 8–23)
CO2: 25 mmol/L (ref 22–32)
Calcium: 9 mg/dL (ref 8.9–10.3)
Chloride: 106 mmol/L (ref 98–111)
Creatinine, Ser: 0.63 mg/dL (ref 0.44–1.00)
GFR calc Af Amer: >60 mL/min (ref >60)
GFR calc non Af Amer: >60 mL/min (ref >60)
Glucose, Bld: 126 mg/dL — ABNORMAL HIGH (ref 70–99)
Potassium: 3.1 mmol/L — ABNORMAL LOW (ref 3.5–5.1)
Sodium: 139 mmol/L (ref 135–145)
Total Bilirubin: 0.6 mg/dL (ref 0.3–1.2)
Total Protein: 6.7 g/dL (ref 6.5–8.1)

## 2019-11-03 LAB — CBC WITH DIFFERENTIAL/PLATELET
Abs Immature Granulocytes: 0.02 10*3/uL (ref 0.00–0.07)
Basophils Absolute: 0 10*3/uL (ref 0.0–0.1)
Basophils Relative: 0 %
Eosinophils Absolute: 0.2 10*3/uL (ref 0.0–0.5)
Eosinophils Relative: 5 %
HCT: 26.8 % — ABNORMAL LOW (ref 36.0–46.0)
Hemoglobin: 9 g/dL — ABNORMAL LOW (ref 12.0–15.0)
Immature Granulocytes: 0 %
Lymphocytes Relative: 23 %
Lymphs Abs: 1.1 10*3/uL (ref 0.7–4.0)
MCH: 30.2 pg (ref 26.0–34.0)
MCHC: 33.6 g/dL (ref 30.0–36.0)
MCV: 89.9 fL (ref 80.0–100.0)
Monocytes Absolute: 0.5 10*3/uL (ref 0.1–1.0)
Monocytes Relative: 11 %
Neutro Abs: 2.8 10*3/uL (ref 1.7–7.7)
Neutrophils Relative %: 61 %
Platelets: 257 10*3/uL (ref 150–400)
RBC: 2.98 MIL/uL — ABNORMAL LOW (ref 3.87–5.11)
RDW: 19.1 % — ABNORMAL HIGH (ref 11.5–15.5)
WBC: 4.6 10*3/uL (ref 4.0–10.5)
nRBC: 0.4 % — ABNORMAL HIGH (ref 0.0–0.2)

## 2019-11-03 LAB — MAGNESIUM: Magnesium: 1.7 mg/dL (ref 1.7–2.4)

## 2019-11-03 MED ORDER — SODIUM CHLORIDE 0.9 % IV SOLN
80.0000 mg/m2 | Freq: Once | INTRAVENOUS | Status: AC
  Administered 2019-11-03: 150 mg via INTRAVENOUS
  Filled 2019-11-03: qty 25

## 2019-11-03 MED ORDER — DIPHENHYDRAMINE HCL 50 MG/ML IJ SOLN
50.0000 mg | Freq: Once | INTRAMUSCULAR | Status: AC
  Administered 2019-11-03: 50 mg via INTRAVENOUS
  Filled 2019-11-03: qty 1

## 2019-11-03 MED ORDER — POTASSIUM CHLORIDE 20 MEQ/100ML IV SOLN
20.0000 meq | Freq: Once | INTRAVENOUS | Status: AC
  Administered 2019-11-03: 20 meq via INTRAVENOUS

## 2019-11-03 MED ORDER — HEPARIN SOD (PORK) LOCK FLUSH 100 UNIT/ML IV SOLN
500.0000 [IU] | Freq: Once | INTRAVENOUS | Status: AC | PRN
  Administered 2019-11-03: 500 [IU]
  Filled 2019-11-03: qty 5

## 2019-11-03 MED ORDER — HEPARIN SOD (PORK) LOCK FLUSH 100 UNIT/ML IV SOLN
INTRAVENOUS | Status: AC
  Filled 2019-11-03: qty 5

## 2019-11-03 MED ORDER — SODIUM CHLORIDE 0.9 % IV SOLN
Freq: Once | INTRAVENOUS | Status: AC
  Filled 2019-11-03: qty 250

## 2019-11-03 MED ORDER — POTASSIUM CHLORIDE ER 10 MEQ PO TBCR: 10.0000 meq | EXTENDED_RELEASE_TABLET | Freq: Every day | ORAL | 0 refills | Status: DC

## 2019-11-03 MED ORDER — FAMOTIDINE IN NACL 20-0.9 MG/50ML-% IV SOLN
20.0000 mg | Freq: Once | INTRAVENOUS | Status: AC
  Administered 2019-11-03: 20 mg via INTRAVENOUS
  Filled 2019-11-03: qty 50

## 2019-11-03 MED ORDER — SODIUM CHLORIDE 0.9 % IV SOLN
10.0000 mg | Freq: Once | INTRAVENOUS | Status: AC
  Administered 2019-11-03: 10 mg via INTRAVENOUS
  Filled 2019-11-03: qty 10

## 2019-11-03 NOTE — Progress Notes (Signed)
Patient denies new problems/concerns today.   °

## 2019-11-03 NOTE — Progress Notes (Signed)
Hematology/Oncology follow up note [facility] Telephone:(336) [phone] Fax:(336) [phone]   Patient Care Team: Lavera Guise, MD as PCP - General (Internal Medicine) Kate Sable, MD as PCP - Cardiology (Cardiology) Rico Junker, RN as Registered Nurse Theodore Demark, RN as Registered Nurse Earlie Server, MD as Consulting Physician (Oncology)  REFERRING PROVIDER: Lavera Guise, MD  CHIEF COMPLAINTS/REASON FOR VISIT:  Follow-up for breast cancer  HISTORY OF PRESENTING ILLNESS:   Dawn Alvarez is a  64 y.o.  female with PMH listed below was seen in consultation at the request of  Lavera Guise, MD  for evaluation of breast cancer Patient had screening mammogram done 08/12/2017 which showed right breast asymmetry.  A diagnostic mammogram was suggested and patient did not have it done. Patient felt her right breast mass for a few weeks.  She had bilateral diagnostic mammogram done on 06/30/2019. 2.8 x 2.4 x 2.6 cm right breast mass, 11:00, 10 cm from the nipple.  There is a single mildly abnormal node in the right axilla with a cortex measuring up to 4.4 mm.  No other suspicious findings. Patient underwent ultrasound-guided core biopsy of the right breast mass and right axilla lymph node Pathology showed invasive mammary carcinoma, no special type, grade 3, ER/PR HER-2 status are pending. Right axillary lymph node biopsy showed predominantly blood in the fibroadipose tissue, with scant lymphoid tissue present.  No definite malignancy was identified.  Patient was referred to cancer center to establish care and discuss treatment plan. Menarche 2 Postmenopausal.  LMP when she was 64 years old. She recalls use of birth control pills. Denies any hormone .  Replacement therapy. Denies any prior chest radiation. She reports family history of maternal grandmother and 2 maternal cousins were diagnosed with cancer.  She does not know about details. # 09/03/2019,  patient had an episode of syncope and EMS was called and patient sent to emergency room.  Patient was noted to have a heart rate 45-65 and blood glucose level of 286. Patient denies any history of nausea, vomiting, diarrhea.  Denies any dehydration.  She reported history of intermittent abdominal pain. CT head without contrast showed no acute intracranial abnormality.  CT abdomen pelvis with contrast is negative for evidence of acute abdominal abnormality.  Patient was given IV fluid. Patient was observed with holding her blood pressure medication including amlodipine, bisoprolol.  No arrhythmia was noted on telemetry.  Echocardiogram showed LVEF 60 to 65%.  No valvular abnormalities.  Beta-blocker and diuretics was discontinued.  # Patient was seen by cardiology Dr. Garen Lah for evaluation of syncope and was cleared for chemotherapy. # 10/03/2019, interval unilateral right diagnostic mammogram showed right breast mass 11:00 size has decreased to 3.7 x 1.3 x 2.6 cm.  Previously mass measured 2.8 x 2.4 x 2.6 cm  INTERVAL HISTORY Dawn Alvarez is a 64 y.o. female who has above history reviewed by me today presents for follow up visit for management of breast cancer, evaluation prior to chemotherapy. Problems and complaints are listed below: Patient denies no particular concerns or side effects after last Taxol treatments. She was accompanied by her daughter today. No additional lightheaded episodes. She reports intermittent numbness and tingling sensation of her feet . Review of Systems  Constitutional: Negative for appetite change, chills, fatigue and fever.  HENT:   Negative for hearing loss and voice change.   Eyes: Negative for eye problems.  Respiratory: Negative for chest tightness and cough.   Cardiovascular:  Negative for chest pain.  Gastrointestinal: Negative for abdominal distention, abdominal pain and blood in stool.  Endocrine: Negative for hot flashes.  Genitourinary: Negative  for difficulty urinating and frequency.   Musculoskeletal: Negative for arthralgias.  Skin: Negative for itching and rash.  Neurological: Positive for numbness. Negative for extremity weakness.  Hematological: Negative for adenopathy.  Psychiatric/Behavioral: Negative for confusion.    MEDICAL HISTORY:  Past Medical History:  Diagnosis Date  . Asthma   . Cancer ([place])    breast  . COPD exacerbation ([place]) 04/12/2016  . Diabetes mellitus without complication ([place])   . Hyperlipemia   . Hypertension   . Personal history of chemotherapy     SURGICAL HISTORY: Past Surgical History:  Procedure Laterality Date  . BREAST BIOPSY Right 07/05/2019   [place] bx venus marker, grade 3 invasive mammary carcinoma  . BREAST BIOPSY Right 07/05/2019   LN bx, hydromarker,PREDOMINANTLY BLOOD AND FIBROADIPOSE TISSUE, WITH SCANT LYMPHOID TISSUE PRESENT      . CESAREAN SECTION    . COLONOSCOPY WITH PROPOFOL N/A 09/11/2015   Procedure: COLONOSCOPY WITH PROPOFOL;  Surgeon: Lucilla Lame, MD;  Location: [facility] ENDOSCOPY;  Service: Endoscopy;  Laterality: N/A;  . IR IMAGING GUIDED PORT INSERTION  07/20/2019    SOCIAL HISTORY: Social History   Socioeconomic History  . Marital status: Married    Spouse name: Not on file  . Number of children: Not on file  . Years of education: Not on file  . Highest education level: Not on file  Occupational History  . Not on file  Tobacco Use  . Smoking status: Former Smoker    Packs/day: 1.00    Years: 2.00    Pack years: 2.00    Types: Cigarettes    Quit date: 05/19/2019    Years since quitting: 0.4  . Smokeless tobacco: Never Used  Substance and Sexual Activity  . Alcohol use: Not Currently  . Drug use: No  . Sexual activity: Never    Birth control/protection: Abstinence  Other Topics Concern  . Not on file  Social History Narrative  . Not on file   Social Determinants of Health   Financial Resource Strain:   . Difficulty of Paying Living Expenses:     Food Insecurity:   . Worried About Charity fundraiser in the Last Year:   . Arboriculturist in the Last Year:   Transportation Needs:   . Film/video editor (Medical):   Marland Kitchen Lack of Transportation (Non-Medical):   Physical Activity:   . Days of Exercise per Week:   . Minutes of Exercise per Session:   Stress:   . Feeling of Stress :   Social Connections:   . Frequency of Communication with Friends and Family:   . Frequency of Social Gatherings with Friends and Family:   . Attends Religious Services:   . Active Member of Clubs or Organizations:   . Attends Archivist Meetings:   Marland Kitchen Marital Status:   Intimate Partner Violence:   . Fear of Current or Ex-Partner:   . Emotionally Abused:   Marland Kitchen Physically Abused:   . Sexually Abused:     FAMILY HISTORY: Family History  Problem Relation Age of Onset  . Diabetes Mother   . Hypertension Mother   . Hypertension Father   . Diabetes Father     ALLERGIES:  has No Known Allergies.  MEDICATIONS:  Current Outpatient Medications  Medication Sig Dispense Refill  . Alcohol Swabs (B-D SINGLE  USE SWABS REGULAR) PADS Use as directed twice a day 300 each 3  . amLODipine (NORVASC) 5 MG tablet Take one tab po qd for htn 30 tablet 3  . Blood Glucose Monitoring Suppl (TRUE METRIX AIR GLUCOSE METER) w/Device KIT 1 Device by Does not apply route in the morning and at bedtime. Use ad directed e11.65 1 kit 0  . brimonidine (ALPHAGAN) 0.2 % ophthalmic solution Place 1 drop into both eyes 2 (two) times daily.    Marland Kitchen glucose blood (TRUE METRIX BLOOD GLUCOSE TEST) test strip Use as instructed twice a daily diag e11.65 100 each 3  . insulin detemir (LEVEMIR FLEXTOUCH) 100 UNIT/ML FlexPen Inject 44 Units into the skin daily. 15 mL 3  . Insulin Pen Needle (PEN NEEDLES) 31G X 8 MM MISC Use as directed with insulin E11.65 100 each 1  . Lancets Misc. (ACCU-CHEK MULTICLIX LANCET DEV) KIT by Does not apply route. Use as directed twice a day diag E11.65     . lidocaine-prilocaine (EMLA) cream     . Omega-3 Fatty Acids (FISH OIL) 1000 MG CAPS Take 1,000 mg by mouth daily.     . prochlorperazine (COMPAZINE) 10 MG tablet Take 1 tablet (10 mg total) by mouth every 6 (six) hours as needed for nausea or vomiting. 60 tablet 1  . Semaglutide, 1 MG/DOSE, (OZEMPIC, 1 MG/DOSE,) 2 MG/1.5ML SOPN Inject 1 mg into the skin once a week. 5 pen 3  . SIMBRINZA 1-0.2 % SUSP     . TRUEplus Lancets 30G MISC Use as directed twice daily diag e11.65 100 each 3  . potassium chloride (KLOR-CON) 10 MEQ tablet Take 1 tablet (10 mEq total) by mouth daily. 30 tablet 0   No current facility-administered medications for this visit.   Facility-Administered Medications Ordered in Other Visits  Medication Dose Route Frequency Provider Last Rate Last Admin  . sodium chloride flush (NS) 0.9 % injection 10 mL  10 mL Intravenous PRN Earlie Server, MD   10 mL at 10/11/19 0817  . sodium chloride flush (NS) 0.9 % injection 10 mL  10 mL Intravenous PRN Earlie Server, MD   10 mL at 11/03/19 0910     PHYSICAL EXAMINATION: ECOG PERFORMANCE STATUS: 1 - Symptomatic but completely ambulatory Vitals:   11/03/19 0921  BP: 137/74  Pulse: 78  Resp: 16  Temp: 97.6 F (36.4 C)   Filed Weights   11/03/19 0921  Weight: 165 lb 8 oz (75.1 kg)    Physical Exam Constitutional:      General: She is not in acute distress. HENT:     Head: Normocephalic and atraumatic.  Eyes:     General: No scleral icterus. Cardiovascular:     Rate and Rhythm: Normal rate and regular rhythm.     Heart sounds: Normal heart sounds.  Pulmonary:     Effort: Pulmonary effort is normal. No respiratory distress.     Breath sounds: No wheezing.  Abdominal:     General: Bowel sounds are normal. There is no distension.     Palpations: Abdomen is soft.  Musculoskeletal:        General: No deformity. Normal range of motion.     Cervical back: Normal range of motion and neck supple.  Skin:    General: Skin is warm and  dry.     Findings: No erythema or rash.  Neurological:     Mental Status: She is alert and oriented to person, place, and time. Mental status is at baseline.  Cranial Nerves: No cranial nerve deficit.     Coordination: Coordination normal.  Psychiatric:        Mood and Affect: Mood normal.      LABORATORY DATA:  I have reviewed the data as listed Lab Results  Component Value Date   WBC 4.6 11/03/2019   HGB 9.0 (L) 11/03/2019   HCT 26.8 (L) 11/03/2019   MCV 89.9 11/03/2019   PLT 257 11/03/2019   Recent Labs    10/18/19 0801 10/25/19 0840 11/03/19 0904  NA 137 139 139  K 3.3* 3.8 3.1*  CL 107 106 106  CO2 _0 GLUCOSE 241* 188* 126*  BUN _1 CREATININE 0.80 0.82 0.63  CALCIUM 8.9 9.0 9.0  GFRNONAA >60 >60 >60  GFRAA >60 >60 >60  PROT 6.6 6.9 6.7  ALBUMIN 3.4* 3.8 3.5  AST _2 ALT _3 ALKPHOS 30* 28* 25*  BILITOT 0.4 0.5 0.6   Iron/TIBC/Ferritin/ %Sat No results found for: IRON, TIBC, FERRITIN, IRONPCTSAT    RADIOGRAPHIC STUDIES: I have personally reviewed the radiological images as listed and agreed with the findings in the report. CT Head Wo Contrast  Result Date: 09/03/2019 CLINICAL DATA:  Encephalopathy. EXAM: CT HEAD WITHOUT CONTRAST TECHNIQUE: Contiguous axial images were obtained from the base of the skull through the vertex without intravenous contrast. COMPARISON:  Head CT 07/30/2019 FINDINGS: Brain: Brain volume is normal for age. No intracranial hemorrhage, mass effect, or midline shift. No hydrocephalus. The basilar cisterns are patent. Unchanged periventricular and deep chronic small vessel ischemia. Punctate lacunar infarct in the right thalamus. No evidence of territorial infarct or acute ischemia. No extra-axial or intracranial fluid collection. Vascular: No hyperdense vessel. Skull: Normal. Negative for fracture or focal lesion. Sinuses/Orbits: Paranasal sinuses and mastoid air cells are clear. The visualized orbits are  unremarkable. Other: None. IMPRESSION: 1. No acute intracranial abnormality. 2. Stable chronic small vessel ischemia from prior. Electronically Signed   By: Keith Rake M.D.   On: 09/03/2019 19:53   CT ABDOMEN PELVIS W CONTRAST  Result Date: 09/03/2019 CLINICAL DATA:  Nausea vomiting. EXAM: CT ABDOMEN AND PELVIS WITH CONTRAST TECHNIQUE: Multidetector CT imaging of the abdomen and pelvis was performed using the standard protocol following bolus administration of intravenous contrast. CONTRAST:  169m OMNIPAQUE IOHEXOL 300 MG/ML  SOLN COMPARISON:  April 20, 2016 FINDINGS: Lower chest: No acute abnormality. Hepatobiliary: No focal liver abnormality is seen. No gallstones, gallbladder wall thickening, or biliary dilatation. Pancreas: Unremarkable. No pancreatic ductal dilatation or surrounding inflammatory changes. Spleen: Normal in size without focal abnormality. Adrenals/Urinary Tract: Adrenal glands are unremarkable. Kidneys are normal, without renal calculi, solid lesion, or hydronephrosis. 2 cm right renal cyst. Bladder is unremarkable. Stomach/Bowel: Stomach is within normal limits. Appendix appears normal. No evidence of bowel wall thickening, distention, or inflammatory changes. Vascular/Lymphatic: Aortic atherosclerosis. No enlarged abdominal or pelvic lymph nodes. Reproductive: Numerous some calcified myometrial masses. Other: No abdominal wall hernia or abnormality. No abdominopelvic ascites. Musculoskeletal: L4-L5 spondylosis. IMPRESSION: 1. No evidence of acute abnormalities within the solid abdominal organs. 2. Myometrial fibroids. Aortic Atherosclerosis (ICD10-I70.0). Electronically Signed   By: DFidela SalisburyM.D.   On: 09/03/2019 19:54   [place]ast Limited Uni Right Inc Axilla  Result Date: 10/03/2019 CLINICAL DATA:  Ultrasound-guided core biopsy of mass in the RIGHT breast 10-11 o'clock location performed 07/05/2019, showing grade 3 invasive mammary carcinoma. Ultrasound-guided core  biopsy of RIGHT axillary lymph node was benign and concordant.  Patient has undergone neoadjuvant treatment scans feels the mass has gotten smaller. Assess for response to treatment. EXAM: DIGITAL DIAGNOSTIC RIGHT MAMMOGRAM WITH CAD AND TOMO ULTRASOUND RIGHT BREAST COMPARISON:  07/05/2019 and earlier ACR Breast Density Category b: There are scattered areas of fibroglandular density. FINDINGS: Within the UPPER-OUTER QUADRANT of the RIGHT breast there is a spiculated mass, marked with a tissue marking clip. Lesion appears smaller compared to prior study. No new suspicious findings in the RIGHT breast. Mammographic images were processed with CAD. Targeted ultrasound is performed, showingirregular hypoechoic mass in the 11 o'clock location of the RIGHT breast 10 centimeters from the nipple, measuring 2.7 x 1.3 x 2.6 centimeters. Previously mass measured 2.8 x 2.4 x 2.6 centimeters. IMPRESSION: Smaller mass in the 11 o'clock location of the RIGHT breast. RECOMMENDATION: Treatment plan for known RIGHT breast cancer. I have discussed the findings and recommendations with the patient. If applicable, a reminder letter will be sent to the patient regarding the next appointment. BI-RADS CATEGORY  6: Known biopsy-proven malignancy. Electronically Signed   By: Nolon Nations M.D.   On: 10/03/2019 14:36   MM DIAG BREAST TOMO UNI RIGHT  Result Date: 10/03/2019 CLINICAL DATA:  Ultrasound-guided core biopsy of mass in the RIGHT breast 10-11 o'clock location performed 07/05/2019, showing grade 3 invasive mammary carcinoma. Ultrasound-guided core biopsy of RIGHT axillary lymph node was benign and concordant. Patient has undergone neoadjuvant treatment scans feels the mass has gotten smaller. Assess for response to treatment. EXAM: DIGITAL DIAGNOSTIC RIGHT MAMMOGRAM WITH CAD AND TOMO ULTRASOUND RIGHT BREAST COMPARISON:  07/05/2019 and earlier ACR Breast Density Category b: There are scattered areas of fibroglandular density.  FINDINGS: Within the UPPER-OUTER QUADRANT of the RIGHT breast there is a spiculated mass, marked with a tissue marking clip. Lesion appears smaller compared to prior study. No new suspicious findings in the RIGHT breast. Mammographic images were processed with CAD. Targeted ultrasound is performed, showingirregular hypoechoic mass in the 11 o'clock location of the RIGHT breast 10 centimeters from the nipple, measuring 2.7 x 1.3 x 2.6 centimeters. Previously mass measured 2.8 x 2.4 x 2.6 centimeters. IMPRESSION: Smaller mass in the 11 o'clock location of the RIGHT breast. RECOMMENDATION: Treatment plan for known RIGHT breast cancer. I have discussed the findings and recommendations with the patient. If applicable, a reminder letter will be sent to the patient regarding the next appointment. BI-RADS CATEGORY  6: Known biopsy-proven malignancy. Electronically Signed   By: Nolon Nations M.D.   On: 10/03/2019 14:36   ECHOCARDIOGRAM COMPLETE  Result Date: 09/04/2019    ECHOCARDIOGRAM REPORT   Patient Name:   Dawn Alvarez Date of Exam: 09/04/2019 Medical Rec #:  [id number]        Height:       61.0 in Accession #:    [id number]       Weight:       169.2 lb Date of Birth:  [birth year]        BSA:          1.759 m Patient Age:    33 years         BP:           175/83 mmHg Patient Gender: F                HR:           67 bpm. Exam Location:  [facility] Procedure: 2D Echo Indications:     Syncope 780.2/ R55  History:  Patient has no prior history of Echocardiogram examinations.  Sonographer:     Arville Go RDCS Referring Phys:  [place] Diagnosing Phys: Ida Rogue MD IMPRESSIONS  1. Left ventricular ejection fraction, by estimation, is 60 to 65%. The left ventricle has normal function. The left ventricle has no regional wall motion abnormalities. There is mild left ventricular hypertrophy. Left ventricular diastolic parameters were normal.  2. Right ventricular systolic function is normal. The right  ventricular size is normal. Tricuspid regurgitation signal is inadequate for assessing PA pressure. FINDINGS  Left Ventricle: Left ventricular ejection fraction, by estimation, is 60 to 65%. The left ventricle has normal function. The left ventricle has no regional wall motion abnormalities. The left ventricular internal cavity size was normal in size. There is  mild left ventricular hypertrophy. Left ventricular diastolic parameters were normal. Right Ventricle: The right ventricular size is normal. No increase in right ventricular wall thickness. Right ventricular systolic function is normal. Tricuspid regurgitation signal is inadequate for assessing PA pressure. Left Atrium: Left atrial size was normal in size. Right Atrium: Right atrial size was normal in size. Pericardium: There is no evidence of pericardial effusion. Mitral Valve: The mitral valve is normal in structure. Normal mobility of the mitral valve leaflets. Trivial mitral valve regurgitation. No evidence of mitral valve stenosis. Tricuspid Valve: The tricuspid valve is normal in structure. Tricuspid valve regurgitation is not demonstrated. No evidence of tricuspid stenosis. Aortic Valve: The aortic valve is normal in structure. Aortic valve regurgitation is not visualized. No aortic stenosis is present. Aortic valve mean gradient measures 7.0 mmHg. Aortic valve peak gradient measures 13.5 mmHg. Aortic valve area, by VTI measures 1.48 cm. Pulmonic Valve: The pulmonic valve was normal in structure. Pulmonic valve regurgitation is not visualized. No evidence of pulmonic stenosis. Aorta: The aortic root is normal in size and structure. Venous: The inferior vena cava is normal in size with greater than 50% respiratory variability, suggesting right atrial pressure of 3 mmHg. IAS/Shunts: No atrial level shunt detected by color flow Doppler.  LEFT VENTRICLE PLAX 2D LVIDd:         4.11 cm  Diastology LVIDs:         2.51 cm  LV e' lateral:   9.25 cm/s LV PW:          1.35 cm  LV E/e' lateral: 11.8 LV IVS:        1.17 cm  LV e' medial:    7.51 cm/s LVOT diam:     1.90 cm  LV E/e' medial:  14.5 LV SV:         65 LV SV Index:   37 LVOT Area:     2.84 cm  RIGHT VENTRICLE RV Basal diam:  2.39 cm RV S prime:     19.00 cm/s TAPSE (M-mode): 2.1 cm LEFT ATRIUM             Index       RIGHT ATRIUM           Index LA diam:        3.30 cm 1.88 cm/m  RA Area:     10.60 cm LA Vol (A2C):   41.0 ml 23.31 ml/m RA Volume:   22.90 ml  13.02 ml/m LA Vol (A4C):   40.2 ml 22.85 ml/m LA Biplane Vol: 42.6 ml 24.22 ml/m  AORTIC VALVE                    PULMONIC  VALVE AV Area (Vmax):    1.59 cm     PV Vmax:       0.84 m/s AV Area (Vmean):   1.59 cm     PV Peak grad:  2.8 mmHg AV Area (VTI):     1.48 cm AV Vmax:           184.00 cm/s AV Vmean:          117.000 cm/s AV VTI:            0.441 m AV Peak Grad:      13.5 mmHg AV Mean Grad:      7.0 mmHg LVOT Vmax:         103.00 cm/s LVOT Vmean:        65.500 cm/s LVOT VTI:          0.230 m LVOT/AV VTI ratio: 0.52  AORTA Ao Root diam: 2.60 cm MITRAL VALVE MV Area (PHT): 3.12 cm     SHUNTS MV Decel Time: 243 msec     Systemic VTI:  0.23 m MV E velocity: 109.00 cm/s  Systemic Diam: 1.90 cm MV A velocity: 85.90 cm/s MV E/A ratio:  1.27 Ida Rogue MD Electronically signed by Ida Rogue MD Signature Date/Time: 09/04/2019/1:50:56 PM    Final    LONG TERM MONITOR (3-14 DAYS)  Result Date: 10/14/2019 Patient had a min HR of 58 bpm, max HR of 176 bpm, and avg HR of 88 bpm. Predominant underlying rhythm was Sinus Rhythm.  Occasional atrial tachycardia runs noted. . Isolated SVEs were rare (<1.0%).  No significant arrhythmias noted. Overall benign cardiac monitor.     ASSESSMENT & PLAN:  1. Malignant neoplasm of upper-outer quadrant of right female breast, unspecified estrogen receptor status ([place])   2. Encounter for antineoplastic chemotherapy   3. Antineoplastic chemotherapy induced anemia   4. Hypokalemia    cT2N0 grade 3 invasive  mammary carcinoma.  ER negative, PR weakly positive (<=10%), HER-2 negative Status post 4 cycles of ddAC with growth factor support. Patient is currently on weekly Taxol. So far she tolerates well.  Labs are reviewed and discussed with patient.  Proceed with Taxol today. Discussed with patient and her daughter about the plan of repeat images after she finishes all chemotherapy and proceed with surgery.  Chemotherapy-induced anemia.  Hemoglobin is 9.  Continue to monitor Hypokalemia, potassium decreased to 3.1.  Patient will receive IV potassium chloride 20 mEq over 1 hour along with her chemo today.  Prescription of potassium chloride 10 meq daily was sent to pharmacy.  All questions were answered. The patient knows to call the clinic with any problems questions or concerns.   Return of visit: 1 week. Earlie Server, MD, PhD Hematology Oncology [facility] Pager- [phone] 11/03/2019

## 2019-11-10 ENCOUNTER — Inpatient Hospital Stay: Payer: Medicare HMO

## 2019-11-10 ENCOUNTER — Inpatient Hospital Stay (HOSPITAL_BASED_OUTPATIENT_CLINIC_OR_DEPARTMENT_OTHER): Payer: Medicare HMO | Admitting: Oncology

## 2019-11-10 ENCOUNTER — Encounter: Payer: Self-pay | Admitting: *Deleted

## 2019-11-10 ENCOUNTER — Other Ambulatory Visit: Payer: Self-pay

## 2019-11-10 ENCOUNTER — Encounter: Payer: Self-pay | Admitting: Oncology

## 2019-11-10 VITALS — BP 136/83 | HR 84 | Temp 96.7°F | Wt 163.7 lb

## 2019-11-10 VITALS — BP 153/87 | HR 89 | Resp 16

## 2019-11-10 DIAGNOSIS — Z79899 Other long term (current) drug therapy: Secondary | ICD-10-CM

## 2019-11-10 DIAGNOSIS — T451X5A Adverse effect of antineoplastic and immunosuppressive drugs, initial encounter: Secondary | ICD-10-CM

## 2019-11-10 DIAGNOSIS — E876 Hypokalemia: Secondary | ICD-10-CM

## 2019-11-10 DIAGNOSIS — C50411 Malignant neoplasm of upper-outer quadrant of right female breast: Secondary | ICD-10-CM | POA: Diagnosis not present

## 2019-11-10 DIAGNOSIS — Z171 Estrogen receptor negative status [ER-]: Secondary | ICD-10-CM | POA: Diagnosis not present

## 2019-11-10 DIAGNOSIS — Z5111 Encounter for antineoplastic chemotherapy: Secondary | ICD-10-CM

## 2019-11-10 DIAGNOSIS — D6481 Anemia due to antineoplastic chemotherapy: Secondary | ICD-10-CM | POA: Diagnosis not present

## 2019-11-10 DIAGNOSIS — Z006 Encounter for examination for normal comparison and control in clinical research program: Secondary | ICD-10-CM | POA: Diagnosis not present

## 2019-11-10 LAB — CBC WITH DIFFERENTIAL/PLATELET
Abs Immature Granulocytes: 0.02 10*3/uL (ref 0.00–0.07)
Basophils Absolute: 0.1 10*3/uL (ref 0.0–0.1)
Basophils Relative: 1 %
Eosinophils Absolute: 0.4 10*3/uL (ref 0.0–0.5)
Eosinophils Relative: 7 %
HCT: 27.1 % — ABNORMAL LOW (ref 36.0–46.0)
Hemoglobin: 9.2 g/dL — ABNORMAL LOW (ref 12.0–15.0)
Immature Granulocytes: 0 %
Lymphocytes Relative: 20 %
Lymphs Abs: 1.1 10*3/uL (ref 0.7–4.0)
MCH: 30.7 pg (ref 26.0–34.0)
MCHC: 33.9 g/dL (ref 30.0–36.0)
MCV: 90.3 fL (ref 80.0–100.0)
Monocytes Absolute: 0.5 10*3/uL (ref 0.1–1.0)
Monocytes Relative: 9 %
Neutro Abs: 3.6 10*3/uL (ref 1.7–7.7)
Neutrophils Relative %: 63 %
Platelets: 276 10*3/uL (ref 150–400)
RBC: 3 MIL/uL — ABNORMAL LOW (ref 3.87–5.11)
RDW: 18.2 % — ABNORMAL HIGH (ref 11.5–15.5)
WBC: 5.6 10*3/uL (ref 4.0–10.5)
nRBC: 0 % (ref 0.0–0.2)

## 2019-11-10 LAB — COMPREHENSIVE METABOLIC PANEL
ALT: 13 U/L (ref 0–44)
AST: 18 U/L (ref 15–41)
Albumin: 3.6 g/dL (ref 3.5–5.0)
Alkaline Phosphatase: 31 U/L — ABNORMAL LOW (ref 38–126)
Anion gap: 10 (ref 5–15)
BUN: 7 mg/dL — ABNORMAL LOW (ref 8–23)
CO2: 24 mmol/L (ref 22–32)
Calcium: 9.2 mg/dL (ref 8.9–10.3)
Chloride: 103 mmol/L (ref 98–111)
Creatinine, Ser: 0.76 mg/dL (ref 0.44–1.00)
GFR calc Af Amer: >60 mL/min (ref >60)
GFR calc non Af Amer: >60 mL/min (ref >60)
Glucose, Bld: 162 mg/dL — ABNORMAL HIGH (ref 70–99)
Potassium: 3.5 mmol/L (ref 3.5–5.1)
Sodium: 137 mmol/L (ref 135–145)
Total Bilirubin: 0.5 mg/dL (ref 0.3–1.2)
Total Protein: 7 g/dL (ref 6.5–8.1)

## 2019-11-10 LAB — MAGNESIUM: Magnesium: 1.7 mg/dL (ref 1.7–2.4)

## 2019-11-10 MED ORDER — FAMOTIDINE IN NACL 20-0.9 MG/50ML-% IV SOLN
20.0000 mg | Freq: Once | INTRAVENOUS | Status: AC
  Administered 2019-11-10: 20 mg via INTRAVENOUS
  Filled 2019-11-10: qty 50

## 2019-11-10 MED ORDER — DIPHENHYDRAMINE HCL 50 MG/ML IJ SOLN
50.0000 mg | Freq: Once | INTRAMUSCULAR | Status: AC
  Administered 2019-11-10: 50 mg via INTRAVENOUS
  Filled 2019-11-10: qty 1

## 2019-11-10 MED ORDER — HEPARIN SOD (PORK) LOCK FLUSH 100 UNIT/ML IV SOLN
500.0000 [IU] | Freq: Once | INTRAVENOUS | Status: AC
  Administered 2019-11-10: 500 [IU] via INTRAVENOUS
  Filled 2019-11-10: qty 5

## 2019-11-10 MED ORDER — SODIUM CHLORIDE 0.9% FLUSH
10.0000 mL | Freq: Once | INTRAVENOUS | Status: AC
  Administered 2019-11-10: 10 mL via INTRAVENOUS
  Filled 2019-11-10: qty 10

## 2019-11-10 MED ORDER — SODIUM CHLORIDE 0.9 % IV SOLN
80.0000 mg/m2 | Freq: Once | INTRAVENOUS | Status: AC
  Administered 2019-11-10: 150 mg via INTRAVENOUS
  Filled 2019-11-10: qty 25

## 2019-11-10 MED ORDER — SODIUM CHLORIDE 0.9 % IV SOLN
10.0000 mg | Freq: Once | INTRAVENOUS | Status: AC
  Administered 2019-11-10: 10 mg via INTRAVENOUS
  Filled 2019-11-10: qty 10

## 2019-11-10 MED ORDER — SODIUM CHLORIDE 0.9 % IV SOLN
Freq: Once | INTRAVENOUS | Status: AC
  Filled 2019-11-10: qty 250

## 2019-11-10 MED ORDER — HEPARIN SOD (PORK) LOCK FLUSH 100 UNIT/ML IV SOLN
INTRAVENOUS | Status: AC
  Filled 2019-11-10: qty 5

## 2019-11-10 NOTE — Progress Notes (Signed)
Hematology/Oncology follow up note [facility] [phone] [phone]   Patient Care Team: Lavera Guise, MD as PCP - General (Internal Medicine) Kate Sable, MD as PCP - Cardiology (Cardiology) Rico Junker, RN as Registered Nurse Theodore Demark, RN as Registered Nurse Earlie Server, MD as Consulting Physician (Oncology)  REFERRING PROVIDER: Lavera Guise, MD  CHIEF COMPLAINTS/REASON FOR VISIT:  Follow-up for breast cancer  HISTORY OF PRESENTING ILLNESS:   Dawn Alvarez is a  64 y.o.  female with PMH listed below was seen in consultation at the request of  Lavera Guise, MD  for evaluation of breast cancer Patient had screening mammogram done 08/12/2017 which showed right breast asymmetry.  A diagnostic mammogram was suggested and patient did not have it done. Patient felt her right breast mass for a few weeks.  She had bilateral diagnostic mammogram done on 06/30/2019. 2.8 x 2.4 x 2.6 cm right breast mass, 11:00, 10 cm from the nipple.  There is a single mildly abnormal node in the right axilla with a cortex measuring up to 4.4 mm.  No other suspicious findings. Patient underwent ultrasound-guided core biopsy of the right breast mass and right axilla lymph node Pathology showed invasive mammary carcinoma, no special type, grade 3, ER/PR HER-2 status are pending. Right axillary lymph node biopsy showed predominantly blood in the fibroadipose tissue, with scant lymphoid tissue present.  No definite malignancy was identified.  Patient was referred to cancer center to establish care and discuss treatment plan. Menarche 73 Postmenopausal.  LMP when she was 64 years old. She recalls use of birth control pills. Denies any hormone .  Replacement therapy. Denies any prior chest radiation. She reports family history of maternal grandmother and 2 maternal cousins were diagnosed with cancer.  She does not know about details. # 09/03/2019,  patient had an episode of syncope and EMS was called and patient sent to emergency room.  Patient was noted to have a heart rate 45-65 and blood glucose level of 286. Patient denies any history of nausea, vomiting, diarrhea.  Denies any dehydration.  She reported history of intermittent abdominal pain. CT head without contrast showed no acute intracranial abnormality.  CT abdomen pelvis with contrast is negative for evidence of acute abdominal abnormality.  Patient was given IV fluid. Patient was observed with holding her blood pressure medication including amlodipine, bisoprolol.  No arrhythmia was noted on telemetry.  Echocardiogram showed LVEF 60 to 65%.  No valvular abnormalities.  Beta-blocker and diuretics was discontinued.  # Patient was seen by cardiology Dr. Garen Lah for evaluation of syncope and was cleared for chemotherapy. # 10/03/2019, interval unilateral right diagnostic mammogram showed right breast mass 11:00 size has decreased to 3.7 x 1.3 x 2.6 cm.  Previously mass measured 2.8 x 2.4 x 2.6 cm  INTERVAL HISTORY Dawn Alvarez is a 64 y.o. female who has above history reviewed by me today presents for follow up visit for management of breast cancer, evaluation prior to chemotherapy. Problems and complaints are listed below: Patient denies any potential concerns today or side effects after last Taxol treatments. She was here by herself. No additional lightheaded episodes.  Denies any changes of numbness tingling sensation of her feet. . Review of Systems  Constitutional: Negative for appetite change, chills, fatigue and fever.  HENT:   Negative for hearing loss and voice change.   Eyes: Negative for eye problems.  Respiratory: Negative for chest tightness and cough.   Cardiovascular:  Negative for chest pain.  Gastrointestinal: Negative for abdominal distention, abdominal pain and blood in stool.  Endocrine: Negative for hot flashes.  Genitourinary: Negative for difficulty  urinating and frequency.   Musculoskeletal: Negative for arthralgias.  Skin: Negative for itching and rash.  Neurological: Negative for extremity weakness and numbness.  Hematological: Negative for adenopathy.  Psychiatric/Behavioral: Negative for confusion.    MEDICAL HISTORY:  Past Medical History:  Diagnosis Date  . Asthma   . Cancer ([place])    breast  . COPD exacerbation ([place]) 04/12/2016  . Diabetes mellitus without complication ([place])   . Hyperlipemia   . Hypertension   . Personal history of chemotherapy     SURGICAL HISTORY: Past Surgical History:  Procedure Laterality Date  . BREAST BIOPSY Right 07/05/2019   [place] bx venus marker, grade 3 invasive mammary carcinoma  . BREAST BIOPSY Right 07/05/2019   LN bx, hydromarker,PREDOMINANTLY BLOOD AND FIBROADIPOSE TISSUE, WITH SCANT LYMPHOID TISSUE PRESENT      . CESAREAN SECTION    . COLONOSCOPY WITH PROPOFOL N/A 09/11/2015   Procedure: COLONOSCOPY WITH PROPOFOL;  Surgeon: Lucilla Lame, MD;  Location: [facility] ENDOSCOPY;  Service: Endoscopy;  Laterality: N/A;  . IR IMAGING GUIDED PORT INSERTION  07/20/2019    SOCIAL HISTORY: Social History   Socioeconomic History  . Marital status: Married    Spouse name: Not on file  . Number of children: Not on file  . Years of education: Not on file  . Highest education level: Not on file  Occupational History  . Not on file  Tobacco Use  . Smoking status: Former Smoker    Packs/day: 1.00    Years: 2.00    Pack years: 2.00    Types: Cigarettes    Quit date: 05/19/2019    Years since quitting: 0.4  . Smokeless tobacco: Never Used  Substance and Sexual Activity  . Alcohol use: Not Currently  . Drug use: No  . Sexual activity: Never    Birth control/protection: Abstinence  Other Topics Concern  . Not on file  Social History Narrative  . Not on file   Social Determinants of Health   Financial Resource Strain:   . Difficulty of Paying Living Expenses:   Food Insecurity:   .  Worried About Charity fundraiser in the Last Year:   . Arboriculturist in the Last Year:   Transportation Needs:   . Film/video editor (Medical):   Marland Kitchen Lack of Transportation (Non-Medical):   Physical Activity:   . Days of Exercise per Week:   . Minutes of Exercise per Session:   Stress:   . Feeling of Stress :   Social Connections:   . Frequency of Communication with Friends and Family:   . Frequency of Social Gatherings with Friends and Family:   . Attends Religious Services:   . Active Member of Clubs or Organizations:   . Attends Archivist Meetings:   Marland Kitchen Marital Status:   Intimate Partner Violence:   . Fear of Current or Ex-Partner:   . Emotionally Abused:   Marland Kitchen Physically Abused:   . Sexually Abused:     FAMILY HISTORY: Family History  Problem Relation Age of Onset  . Diabetes Mother   . Hypertension Mother   . Hypertension Father   . Diabetes Father     ALLERGIES:  has No Known Allergies.  MEDICATIONS:  Current Outpatient Medications  Medication Sig Dispense Refill  . Alcohol Swabs (B-D SINGLE USE SWABS  REGULAR) PADS Use as directed twice a day 300 each 3  . amLODipine (NORVASC) 5 MG tablet Take one tab po qd for htn 30 tablet 3  . Blood Glucose Monitoring Suppl (TRUE METRIX AIR GLUCOSE METER) w/Device KIT 1 Device by Does not apply route in the morning and at bedtime. Use ad directed e11.65 1 kit 0  . brimonidine (ALPHAGAN) 0.2 % ophthalmic solution Place 1 drop into both eyes 2 (two) times daily.    Marland Kitchen glucose blood (TRUE METRIX BLOOD GLUCOSE TEST) test strip Use as instructed twice a daily diag e11.65 100 each 3  . insulin detemir (LEVEMIR FLEXTOUCH) 100 UNIT/ML FlexPen Inject 44 Units into the skin daily. 15 mL 3  . Insulin Pen Needle (PEN NEEDLES) 31G X 8 MM MISC Use as directed with insulin E11.65 100 each 1  . Lancets Misc. (ACCU-CHEK MULTICLIX LANCET DEV) KIT by Does not apply route. Use as directed twice a day diag E11.65    .  lidocaine-prilocaine (EMLA) cream     . Omega-3 Fatty Acids (FISH OIL) 1000 MG CAPS Take 1,000 mg by mouth daily.     . potassium chloride (KLOR-CON) 10 MEQ tablet Take 1 tablet (10 mEq total) by mouth daily. 30 tablet 0  . prochlorperazine (COMPAZINE) 10 MG tablet Take 1 tablet (10 mg total) by mouth every 6 (six) hours as needed for nausea or vomiting. 60 tablet 1  . Semaglutide, 1 MG/DOSE, (OZEMPIC, 1 MG/DOSE,) 2 MG/1.5ML SOPN Inject 1 mg into the skin once a week. 5 pen 3  . SIMBRINZA 1-0.2 % SUSP     . TRUEplus Lancets 30G MISC Use as directed twice daily diag e11.65 100 each 3   No current facility-administered medications for this visit.   Facility-Administered Medications Ordered in Other Visits  Medication Dose Route Frequency Provider Last Rate Last Admin  . sodium chloride flush (NS) 0.9 % injection 10 mL  10 mL Intravenous PRN Earlie Server, MD   10 mL at 10/11/19 0817     PHYSICAL EXAMINATION: ECOG PERFORMANCE STATUS: 1 - Symptomatic but completely ambulatory Vitals:   11/10/19 0841 11/10/19 0846  BP: (!) 172/97 136/83  Pulse: 92 84  Temp: (!) 96.7 F (35.9 C)   SpO2: 97%    Filed Weights   11/10/19 0841  Weight: 163 lb 11.2 oz (74.3 kg)    Physical Exam Constitutional:      General: She is not in acute distress. HENT:     Head: Normocephalic and atraumatic.  Eyes:     General: No scleral icterus. Cardiovascular:     Rate and Rhythm: Normal rate and regular rhythm.     Heart sounds: Normal heart sounds.  Pulmonary:     Effort: Pulmonary effort is normal. No respiratory distress.     Breath sounds: No wheezing.  Abdominal:     General: Bowel sounds are normal. There is no distension.     Palpations: Abdomen is soft.  Musculoskeletal:        General: No deformity. Normal range of motion.     Cervical back: Normal range of motion and neck supple.  Skin:    General: Skin is warm and dry.     Findings: No erythema or rash.  Neurological:     Mental Status: She  is alert and oriented to person, place, and time. Mental status is at baseline.     Cranial Nerves: No cranial nerve deficit.     Coordination: Coordination normal.  Psychiatric:  Mood and Affect: Mood normal.      LABORATORY DATA:  I have reviewed the data as listed Lab Results  Component Value Date   WBC 5.6 11/10/2019   HGB 9.2 (L) 11/10/2019   HCT 27.1 (L) 11/10/2019   MCV 90.3 11/10/2019   PLT 276 11/10/2019   Recent Labs    10/25/19 0840 11/03/19 0904 11/10/19 0829  NA 139 139 137  K 3.8 3.1* 3.5  CL 106 106 103  CO2 '25 25 24  ' GLUCOSE 188* 126* 162*  BUN 11 10 7*  CREATININE 0.82 0.63 0.76  CALCIUM 9.0 9.0 9.2  GFRNONAA >60 >60 >60  GFRAA >60 >60 >60  PROT 6.9 6.7 7.0  ALBUMIN 3.8 3.5 3.6  AST '22 18 18  ' ALT '16 14 13  ' ALKPHOS 28* 25* 31*  BILITOT 0.5 0.6 0.5   Iron/TIBC/Ferritin/ %Sat No results found for: IRON, TIBC, FERRITIN, IRONPCTSAT    RADIOGRAPHIC STUDIES: I have personally reviewed the radiological images as listed and agreed with the findings in the report. CT Head Wo Contrast  Result Date: 09/03/2019 CLINICAL DATA:  Encephalopathy. EXAM: CT HEAD WITHOUT CONTRAST TECHNIQUE: Contiguous axial images were obtained from the base of the skull through the vertex without intravenous contrast. COMPARISON:  Head CT 07/30/2019 FINDINGS: Brain: Brain volume is normal for age. No intracranial hemorrhage, mass effect, or midline shift. No hydrocephalus. The basilar cisterns are patent. Unchanged periventricular and deep chronic small vessel ischemia. Punctate lacunar infarct in the right thalamus. No evidence of territorial infarct or acute ischemia. No extra-axial or intracranial fluid collection. Vascular: No hyperdense vessel. Skull: Normal. Negative for fracture or focal lesion. Sinuses/Orbits: Paranasal sinuses and mastoid air cells are clear. The visualized orbits are unremarkable. Other: None. IMPRESSION: 1. No acute intracranial abnormality. 2. Stable  chronic small vessel ischemia from prior. Electronically Signed   By: Keith Rake M.D.   On: 09/03/2019 19:53   CT ABDOMEN PELVIS W CONTRAST  Result Date: 09/03/2019 CLINICAL DATA:  Nausea vomiting. EXAM: CT ABDOMEN AND PELVIS WITH CONTRAST TECHNIQUE: Multidetector CT imaging of the abdomen and pelvis was performed using the standard protocol following bolus administration of intravenous contrast. CONTRAST:  127m OMNIPAQUE IOHEXOL 300 MG/ML  SOLN COMPARISON:  April 20, 2016 FINDINGS: Lower chest: No acute abnormality. Hepatobiliary: No focal liver abnormality is seen. No gallstones, gallbladder wall thickening, or biliary dilatation. Pancreas: Unremarkable. No pancreatic ductal dilatation or surrounding inflammatory changes. Spleen: Normal in size without focal abnormality. Adrenals/Urinary Tract: Adrenal glands are unremarkable. Kidneys are normal, without renal calculi, solid lesion, or hydronephrosis. 2 cm right renal cyst. Bladder is unremarkable. Stomach/Bowel: Stomach is within normal limits. Appendix appears normal. No evidence of bowel wall thickening, distention, or inflammatory changes. Vascular/Lymphatic: Aortic atherosclerosis. No enlarged abdominal or pelvic lymph nodes. Reproductive: Numerous some calcified myometrial masses. Other: No abdominal wall hernia or abnormality. No abdominopelvic ascites. Musculoskeletal: L4-L5 spondylosis. IMPRESSION: 1. No evidence of acute abnormalities within the solid abdominal organs. 2. Myometrial fibroids. Aortic Atherosclerosis (ICD10-I70.0). Electronically Signed   By: DFidela SalisburyM.D.   On: 09/03/2019 19:54   [place] Limited Uni Right Inc Axilla  Result Date: 10/03/2019 CLINICAL DATA:  Ultrasound-guided core biopsy of mass in the RIGHT breast 10-11 o'clock location performed 07/05/2019, showing grade 3 invasive mammary carcinoma. Ultrasound-guided core biopsy of RIGHT axillary lymph node was benign and concordant. Patient has undergone  neoadjuvant treatment scans feels the mass has gotten smaller. Assess for response to treatment. EXAM: DIGITAL DIAGNOSTIC RIGHT MAMMOGRAM  WITH CAD AND TOMO ULTRASOUND RIGHT BREAST COMPARISON:  07/05/2019 and earlier ACR Breast Density Category b: There are scattered areas of fibroglandular density. FINDINGS: Within the UPPER-OUTER QUADRANT of the RIGHT breast there is a spiculated mass, marked with a tissue marking clip. Lesion appears smaller compared to prior study. No new suspicious findings in the RIGHT breast. Mammographic images were processed with CAD. Targeted ultrasound is performed, showingirregular hypoechoic mass in the 11 o'clock location of the RIGHT breast 10 centimeters from the nipple, measuring 2.7 x 1.3 x 2.6 centimeters. Previously mass measured 2.8 x 2.4 x 2.6 centimeters. IMPRESSION: Smaller mass in the 11 o'clock location of the RIGHT breast. RECOMMENDATION: Treatment plan for known RIGHT breast cancer. I have discussed the findings and recommendations with the patient. If applicable, a reminder letter will be sent to the patient regarding the next appointment. BI-RADS CATEGORY  6: Known biopsy-proven malignancy. Electronically Signed   By: Nolon Nations M.D.   On: 10/03/2019 14:36   MM DIAG BREAST TOMO UNI RIGHT  Result Date: 10/03/2019 CLINICAL DATA:  Ultrasound-guided core biopsy of mass in the RIGHT breast 10-11 o'clock location performed 07/05/2019, showing grade 3 invasive mammary carcinoma. Ultrasound-guided core biopsy of RIGHT axillary lymph node was benign and concordant. Patient has undergone neoadjuvant treatment scans feels the mass has gotten smaller. Assess for response to treatment. EXAM: DIGITAL DIAGNOSTIC RIGHT MAMMOGRAM WITH CAD AND TOMO ULTRASOUND RIGHT BREAST COMPARISON:  07/05/2019 and earlier ACR Breast Density Category b: There are scattered areas of fibroglandular density. FINDINGS: Within the UPPER-OUTER QUADRANT of the RIGHT breast there is a spiculated mass,  marked with a tissue marking clip. Lesion appears smaller compared to prior study. No new suspicious findings in the RIGHT breast. Mammographic images were processed with CAD. Targeted ultrasound is performed, showingirregular hypoechoic mass in the 11 o'clock location of the RIGHT breast 10 centimeters from the nipple, measuring 2.7 x 1.3 x 2.6 centimeters. Previously mass measured 2.8 x 2.4 x 2.6 centimeters. IMPRESSION: Smaller mass in the 11 o'clock location of the RIGHT breast. RECOMMENDATION: Treatment plan for known RIGHT breast cancer. I have discussed the findings and recommendations with the patient. If applicable, a reminder letter will be sent to the patient regarding the next appointment. BI-RADS CATEGORY  6: Known biopsy-proven malignancy. Electronically Signed   By: Nolon Nations M.D.   On: 10/03/2019 14:36   ECHOCARDIOGRAM COMPLETE  Result Date: 09/04/2019    ECHOCARDIOGRAM REPORT   Patient Name:   Dawn Alvarez Date of Exam: 09/04/2019 Medical Rec #:  [id number]        Height:       61.0 in Accession #:    [id number]       Weight:       169.2 lb Date of Birth:  Jan 21, 1956        BSA:          1.759 m Patient Age:    47 years         BP:           175/83 mmHg Patient Gender: F                HR:           67 bpm. Exam Location:  [facility] Procedure: 2D Echo Indications:     Syncope 780.2/ R55  History:         Patient has no prior history of Echocardiogram examinations.  Sonographer:     [place] Referring  Phys:  [place] Phys: Ida Rogue MD IMPRESSIONS  1. Left ventricular ejection fraction, by estimation, is 60 to 65%. The left ventricle has normal function. The left ventricle has no regional wall motion abnormalities. There is mild left ventricular hypertrophy. Left ventricular diastolic parameters were normal.  2. Right ventricular systolic function is normal. The right ventricular size is normal. Tricuspid regurgitation signal is inadequate for assessing PA  pressure. FINDINGS  Left Ventricle: Left ventricular ejection fraction, by estimation, is 60 to 65%. The left ventricle has normal function. The left ventricle has no regional wall motion abnormalities. The left ventricular internal cavity size was normal in size. There is  mild left ventricular hypertrophy. Left ventricular diastolic parameters were normal. Right Ventricle: The right ventricular size is normal. No increase in right ventricular wall thickness. Right ventricular systolic function is normal. Tricuspid regurgitation signal is inadequate for assessing PA pressure. Left Atrium: Left atrial size was normal in size. Right Atrium: Right atrial size was normal in size. Pericardium: There is no evidence of pericardial effusion. Mitral Valve: The mitral valve is normal in structure. Normal mobility of the mitral valve leaflets. Trivial mitral valve regurgitation. No evidence of mitral valve stenosis. Tricuspid Valve: The tricuspid valve is normal in structure. Tricuspid valve regurgitation is not demonstrated. No evidence of tricuspid stenosis. Aortic Valve: The aortic valve is normal in structure. Aortic valve regurgitation is not visualized. No aortic stenosis is present. Aortic valve mean gradient measures 7.0 mmHg. Aortic valve peak gradient measures 13.5 mmHg. Aortic valve area, by VTI measures 1.48 cm. Pulmonic Valve: The pulmonic valve was normal in structure. Pulmonic valve regurgitation is not visualized. No evidence of pulmonic stenosis. Aorta: The aortic root is normal in size and structure. Venous: The inferior vena cava is normal in size with greater than 50% respiratory variability, suggesting right atrial pressure of 3 mmHg. IAS/Shunts: No atrial level shunt detected by color flow Doppler.  LEFT VENTRICLE PLAX 2D LVIDd:         4.11 cm  Diastology LVIDs:         2.51 cm  LV e' lateral:   9.25 cm/s LV PW:         1.35 cm  LV E/e' lateral: 11.8 LV IVS:        1.17 cm  LV e' medial:    7.51 cm/s  LVOT diam:     1.90 cm  LV E/e' medial:  14.5 LV SV:         65 LV SV Index:   37 LVOT Area:     2.84 cm  RIGHT VENTRICLE RV Basal diam:  2.39 cm RV S prime:     19.00 cm/s TAPSE (M-mode): 2.1 cm LEFT ATRIUM             Index       RIGHT ATRIUM           Index LA diam:        3.30 cm 1.88 cm/m  RA Area:     10.60 cm LA Vol (A2C):   41.0 ml 23.31 ml/m RA Volume:   22.90 ml  13.02 ml/m LA Vol (A4C):   40.2 ml 22.85 ml/m LA Biplane Vol: 42.6 ml 24.22 ml/m  AORTIC VALVE                    PULMONIC VALVE AV Area (Vmax):    1.59 cm     PV Vmax:  0.84 m/s AV Area (Vmean):   1.59 cm     PV Peak grad:  2.8 mmHg AV Area (VTI):     1.48 cm AV Vmax:           184.00 cm/s AV Vmean:          117.000 cm/s AV VTI:            0.441 m AV Peak Grad:      13.5 mmHg AV Mean Grad:      7.0 mmHg LVOT Vmax:         103.00 cm/s LVOT Vmean:        65.500 cm/s LVOT VTI:          0.230 m LVOT/AV VTI ratio: 0.52  AORTA Ao Root diam: 2.60 cm MITRAL VALVE MV Area (PHT): 3.12 cm     SHUNTS MV Decel Time: 243 msec     Systemic VTI:  0.23 m MV E velocity: 109.00 cm/s  Systemic Diam: 1.90 cm MV A velocity: 85.90 cm/s MV E/A ratio:  1.27 Ida Rogue MD Electronically signed by Ida Rogue MD Signature Date/Time: 09/04/2019/1:50:56 PM    Final    LONG TERM MONITOR (3-14 DAYS)  Result Date: 10/14/2019 Patient had a min HR of 58 bpm, max HR of 176 bpm, and avg HR of 88 bpm. Predominant underlying rhythm was Sinus Rhythm.  Occasional atrial tachycardia runs noted. . Isolated SVEs were rare (<1.0%).  No significant arrhythmias noted. Overall benign cardiac monitor.     ASSESSMENT & PLAN:  1. Malignant neoplasm of upper-outer quadrant of right female breast, unspecified estrogen receptor status ([place])   2. Encounter for antineoplastic chemotherapy   3. Antineoplastic chemotherapy induced anemia   4. Hypokalemia    cT2N0 grade 3 invasive mammary carcinoma.  ER negative, PR weakly positive (<=10%), HER-2 negative Status post  4 cycles of ddAC with growth factor support. Patient is currently on weekly Taxol. Patient tolerates well. Labs are reviewed and discussed with patient.  Proceed with Taxol today.   Chemotherapy-induced anemia.  Hemoglobin is 9.2, continue to monitor. Hypokalemia, normalized today.  Continue to take potassium chloride 10 mEq daily  All questions were answered. The patient knows to call the clinic with any problems questions or concerns.   Return of visit: 1 week. Earlie Server, MD, PhD Hematology Oncology [facility] at [facility] Pager- [phone] 11/10/2019

## 2019-11-10 NOTE — Research (Signed)
Patient Dawn Alvarez returns to clinic alone this morning for consideration of Cycle 5 Taxol chemotherapy and week 4 assessment for the [facility] [id number] research study. Patient reports she feels good today and denies experiencing any side effects from the Taxol. She denies experiencing any falls within the past 6 months, and verifies again that she is left-side dominant. Patient completed the PROMIS-29, GSLTPAQ, EORTC QLQ-CIPN20, PRO-CTCAE, and the week 4 Patient Reported Symptom Burden Questionnaire while in exam room and prior to seeing Dr. Tasia Catchings. At the patient's preference, all study-related questionnaires were read out loud and patient responses were recorded by this RN. Dr. Tasia Catchings performed H&P and has completed the Follow up Treatment Burden question and reviewed the week 4 CTCAE (neuropathy) Assessment in which the patient denies experiencing any of the solicited neuropathy symptoms. Neuropen and [place] assessments were completed by Yolande Jolly, RN with assistance of Jeral Fruit, RN. Solicited and other AEs with grade and attribution are as noted below:   Adverse Event Log  Study/Protocol: SWOG V8184 Cycle: Week 4 solicited AEs Event Grade Onset Date Resolved Date Drug Name Attribution Treatment Comments  Dysesthesia 0        Neuralgia 0        Paresthesia 0        Peripheral Motor neuropathy 0        Peripheral Sensory neuropathy 0                 Yolande Jolly, BSN, MHA, OCN 11/10/2019 9:43 AM

## 2019-11-10 NOTE — Progress Notes (Signed)
Patient denies new problems/concerns today.   °

## 2019-11-14 ENCOUNTER — Ambulatory Visit: Payer: Self-pay | Admitting: Genetic Counselor

## 2019-11-14 ENCOUNTER — Telehealth: Payer: Self-pay | Admitting: Genetic Counselor

## 2019-11-14 ENCOUNTER — Encounter: Payer: Self-pay | Admitting: Genetic Counselor

## 2019-11-14 DIAGNOSIS — Z1379 Encounter for other screening for genetic and chromosomal anomalies: Secondary | ICD-10-CM | POA: Insufficient documentation

## 2019-11-14 DIAGNOSIS — C50411 Malignant neoplasm of upper-outer quadrant of right female breast: Secondary | ICD-10-CM

## 2019-11-14 NOTE — Telephone Encounter (Signed)
Revealed negative genetic testing.  Discussed that we do not know why she has breast cancer or why there is cancer in the family. It could be due to a different gene that we are not testing, or maybe our current technology may not be able to pick something up.  It will be important for her to keep in contact with genetics to keep up with whether additional testing may be needed. 

## 2019-11-14 NOTE — Progress Notes (Signed)
HPI:  Ms. Cherry was previously seen in the [place] clinic due to a personal and family history of cancer and concerns regarding a hereditary predisposition to cancer. Please refer to our prior cancer genetics clinic note for more information regarding our discussion, assessment and recommendations, at the time. Ms. Milford recent genetic test results were disclosed to her, as were recommendations warranted by these results. These results and recommendations are discussed in more detail below.  CANCER HISTORY:  Oncology History  Malignant neoplasm of upper-outer quadrant of right female breast ([place])  07/08/2019 Initial Diagnosis   Malignant neoplasm of upper-outer quadrant of right female breast ([place])   08/04/2019 Cancer Staging   Staging form: Breast, AJCC 8th Edition - Clinical: Stage IIB (cT2, cN0, cM0, G3, ER-, PR-, HER2-) - Signed by Earlie Server, MD on 08/04/2019   08/04/2019 -  Chemotherapy   The patient had dexamethasone (DECADRON) 4 MG tablet, 1 of 1 cycle, Start date: 07/19/2019, End date: 07/27/2019 DOXOrubicin (ADRIAMYCIN) chemo injection 114 mg, 60 mg/m2 = 114 mg, Intravenous,  Once, 4 of 4 cycles Administration: 114 mg (08/04/2019), 114 mg (08/18/2019), 114 mg (09/01/2019), 114 mg (09/21/2019) palonosetron (ALOXI) injection 0.25 mg, 0.25 mg, Intravenous,  Once, 4 of 4 cycles Administration: 0.25 mg (08/04/2019), 0.25 mg (08/18/2019), 0.25 mg (09/01/2019), 0.25 mg (09/21/2019) pegfilgrastim-jmdb (FULPHILA) injection 6 mg, 6 mg, Subcutaneous,  Once, 4 of 4 cycles Administration: 6 mg (08/05/2019), 6 mg (08/22/2019), 6 mg (09/02/2019), 6 mg (09/22/2019) cyclophosphamide (CYTOXAN) 1,140 mg in sodium chloride 0.9 % 250 mL chemo infusion, 600 mg/m2 = 1,140 mg, Intravenous,  Once, 4 of 4 cycles Administration: 1,140 mg (08/04/2019), 1,140 mg (08/18/2019), 1,140 mg (09/01/2019), 1,140 mg (09/21/2019) PACLitaxel (TAXOL) 150 mg in sodium chloride 0.9 % 250 mL chemo infusion (</= 46m/m2), 80 mg/m2 = 150  mg, Intravenous,  Once, 5 of 12 cycles Administration: 150 mg (10/11/2019), 150 mg (10/18/2019), 150 mg (10/25/2019), 150 mg (11/03/2019), 150 mg (11/10/2019) fosaprepitant (EMEND) 150 mg in sodium chloride 0.9 % 145 mL IVPB, 150 mg, Intravenous,  Once, 4 of 4 cycles Administration: 150 mg (08/04/2019), 150 mg (08/18/2019), 150 mg (09/01/2019), 150 mg (09/21/2019)  for chemotherapy treatment.    11/11/2019 Genetic Testing   Negative genetic testing on the common hereditary cancer panel.  The Common Hereditary Gene Panel offered by [facility] includes sequencing and/or deletion duplication testing of the following 48 genes: APC, ATM, AXIN2, BARD1, BMPR1A, BRCA1, BRCA2, BRIP1, CDH1, CDK4, CDKN2A (p14ARF), CDKN2A (p16INK4a), CHEK2, CTNNA1, DICER1, EPCAM (Deletion/duplication testing only), GREM1 (promoter region deletion/duplication testing only), KIT, MEN1, MLH1, MSH2, MSH3, MSH6, MUTYH, NBN, NF1, NHTL1, PALB2, PDGFRA, PMS2, POLD1, POLE, PTEN, RAD50, RAD51C, RAD51D, RNF43, SDHB, SDHC, SDHD, SMAD4, SMARCA4. STK11, TP53, TSC1, TSC2, and VHL.  The following genes were evaluated for sequence changes only: SDHA and HOXB13 c.251G>A variant only. The report date is November 11, 2019.     FAMILY HISTORY:  We obtained a detailed, 4-generation family history.  Significant diagnoses are listed below: Family History  Problem Relation Age of Onset  . Diabetes Mother   . Hypertension Mother   . Hypertension Father   . Diabetes Father     The patient has three children who are all cancer free.  She has a brother and three sisters who are all cancer free.  Both parents are deceased.  The patient's father left the family when the patient was 64years old and she does not know his family.  The patient's mother died at 436  from a brain aneurysm.  She had two sisters who are deceased.  One sister had a daughter with an unknown cancer.  There is no other cancer history on the maternal side.  Ms. Fesperman is unaware of previous  family history of genetic testing for hereditary cancer risks. Patient's maternal ancestors are of African American descent, and paternal ancestors are of African American descent. There is no reported Ashkenazi Jewish ancestry. There is no known consanguinity.    GENETIC TEST RESULTS: Genetic testing reported out on November 11, 2019 through the common hereditary cancer panel found no pathogenic mutations. The Common Hereditary Gene Panel offered by [facility] includes sequencing and/or deletion duplication testing of the following 48 genes: APC, ATM, AXIN2, BARD1, BMPR1A, BRCA1, BRCA2, BRIP1, CDH1, CDK4, CDKN2A (p14ARF), CDKN2A (p16INK4a), CHEK2, CTNNA1, DICER1, EPCAM (Deletion/duplication testing only), GREM1 (promoter region deletion/duplication testing only), KIT, MEN1, MLH1, MSH2, MSH3, MSH6, MUTYH, NBN, NF1, NHTL1, PALB2, PDGFRA, PMS2, POLD1, POLE, PTEN, RAD50, RAD51C, RAD51D, RNF43, SDHB, SDHC, SDHD, SMAD4, SMARCA4. STK11, TP53, TSC1, TSC2, and VHL.  The following genes were evaluated for sequence changes only: SDHA and HOXB13 c.251G>A variant only. The test report has been scanned into EPIC and is located under the Molecular Pathology section of the Results Review tab.  A portion of the result report is included below for reference.     We discussed with Ms. Knotek that because current genetic testing is not perfect, it is possible there may be a gene mutation in one of these genes that current testing cannot detect, but that chance is small.  We also discussed, that there could be another gene that has not yet been discovered, or that we have not yet tested, that is responsible for the cancer diagnoses in the family. It is also possible there is a hereditary cause for the cancer in the family that Ms. Kruger did not inherit and therefore was not identified in her testing.  Therefore, it is important to remain in touch with cancer genetics in the future so that we can continue to offer Ms. Lacap the  most up to date genetic testing.   ADDITIONAL GENETIC TESTING: We discussed with Ms. Drakes that there are other genes that are associated with increased cancer risk that can be analyzed. Should Ms. Rayborn wish to pursue additional genetic testing, we are happy to discuss and coordinate this testing, at any time.    CANCER SCREENING RECOMMENDATIONS: Ms. Leoni test result is considered negative (normal).  This means that we have not identified a hereditary cause for her personal and family history of cancer at this time. Most cancers happen by chance and this negative test suggests that her cancer may fall into this category.    While reassuring, this does not definitively rule out a hereditary predisposition to cancer. It is still possible that there could be genetic mutations that are undetectable by current technology. There could be genetic mutations in genes that have not been tested or identified to increase cancer risk.  Therefore, it is recommended she continue to follow the cancer management and screening guidelines provided by her oncology and primary healthcare provider.   An individual's cancer risk and medical management are not determined by genetic test results alone. Overall cancer risk assessment incorporates additional factors, including personal medical history, family history, and any available genetic information that may result in a personalized plan for cancer prevention and surveillance  RECOMMENDATIONS FOR FAMILY MEMBERS:  Individuals in this family might be at some increased risk  of developing cancer, over the general population risk, simply due to the family history of cancer.  We recommended women in this family have a yearly mammogram beginning at age 11, or 87 years younger than the earliest onset of cancer, an annual clinical breast exam, and perform monthly breast self-exams. Women in this family should also have a gynecological exam as recommended by their primary  provider. All family members should have a colonoscopy by age 22.  FOLLOW-UP: Lastly, we discussed with Ms. Gane that cancer genetics is a rapidly advancing field and it is possible that new genetic tests will be appropriate for her and/or her family members in the future. We encouraged her to remain in contact with cancer genetics on an annual basis so we can update her personal and family histories and let her know of advances in cancer genetics that may benefit this family.   Our contact number was provided. Ms. Pavlovich questions were answered to her satisfaction, and she knows she is welcome to call us at anytime with additional questions or concerns.   Roma Kayser, [place], [facility] Licensed, Certified Genetic Counselor [email or web address]

## 2019-11-17 ENCOUNTER — Inpatient Hospital Stay: Payer: Medicare HMO

## 2019-11-17 ENCOUNTER — Encounter: Payer: Self-pay | Admitting: Oncology

## 2019-11-17 ENCOUNTER — Ambulatory Visit
Admission: RE | Admit: 2019-11-17 | Discharge: 2019-11-17 | Disposition: A | Discharge status: Home / Self Care | Payer: Medicare HMO | Source: Ambulatory Visit | Attending: Oncology | Admitting: Oncology

## 2019-11-17 ENCOUNTER — Other Ambulatory Visit: Payer: Self-pay

## 2019-11-17 ENCOUNTER — Ambulatory Visit
Admission: RE | Admit: 2019-11-17 | Discharge: 2019-11-17 | Disposition: A | Discharge status: Home / Self Care | Payer: Medicare HMO | Attending: Oncology | Admitting: Oncology

## 2019-11-17 ENCOUNTER — Inpatient Hospital Stay (HOSPITAL_BASED_OUTPATIENT_CLINIC_OR_DEPARTMENT_OTHER): Payer: Medicare HMO | Admitting: Oncology

## 2019-11-17 VITALS — BP 155/99 | HR 102 | Temp 98.4°F | Resp 20 | Wt 161.3 lb

## 2019-11-17 VITALS — BP 169/97 | HR 106 | Resp 18

## 2019-11-17 DIAGNOSIS — T451X5A Adverse effect of antineoplastic and immunosuppressive drugs, initial encounter: Secondary | ICD-10-CM

## 2019-11-17 DIAGNOSIS — D6481 Anemia due to antineoplastic chemotherapy: Secondary | ICD-10-CM

## 2019-11-17 DIAGNOSIS — Z5111 Encounter for antineoplastic chemotherapy: Secondary | ICD-10-CM

## 2019-11-17 DIAGNOSIS — E860 Dehydration: Secondary | ICD-10-CM | POA: Diagnosis not present

## 2019-11-17 DIAGNOSIS — Z171 Estrogen receptor negative status [ER-]: Secondary | ICD-10-CM

## 2019-11-17 DIAGNOSIS — M25551 Pain in right hip: Secondary | ICD-10-CM

## 2019-11-17 DIAGNOSIS — C50411 Malignant neoplasm of upper-outer quadrant of right female breast: Secondary | ICD-10-CM

## 2019-11-17 DIAGNOSIS — E876 Hypokalemia: Secondary | ICD-10-CM

## 2019-11-17 DIAGNOSIS — Z79899 Other long term (current) drug therapy: Secondary | ICD-10-CM | POA: Diagnosis not present

## 2019-11-17 LAB — COMPREHENSIVE METABOLIC PANEL
ALT: 12 U/L (ref 0–44)
AST: 18 U/L (ref 15–41)
Albumin: 3.7 g/dL (ref 3.5–5.0)
Alkaline Phosphatase: 32 U/L — ABNORMAL LOW (ref 38–126)
Anion gap: 10 (ref 5–15)
BUN: 13 mg/dL (ref 8–23)
CO2: 24 mmol/L (ref 22–32)
Calcium: 9.1 mg/dL (ref 8.9–10.3)
Chloride: 103 mmol/L (ref 98–111)
Creatinine, Ser: 0.72 mg/dL (ref 0.44–1.00)
GFR calc Af Amer: >60 mL/min (ref >60)
GFR calc non Af Amer: >60 mL/min (ref >60)
Glucose, Bld: 80 mg/dL (ref 70–99)
Potassium: 4.1 mmol/L (ref 3.5–5.1)
Sodium: 137 mmol/L (ref 135–145)
Total Bilirubin: 0.6 mg/dL (ref 0.3–1.2)
Total Protein: 7.9 g/dL (ref 6.5–8.1)

## 2019-11-17 LAB — CBC WITH DIFFERENTIAL/PLATELET
Abs Immature Granulocytes: 0.05 10*3/uL (ref 0.00–0.07)
Basophils Absolute: 0.1 10*3/uL (ref 0.0–0.1)
Basophils Relative: 1 %
Eosinophils Absolute: 0.3 10*3/uL (ref 0.0–0.5)
Eosinophils Relative: 4 %
HCT: 28.8 % — ABNORMAL LOW (ref 36.0–46.0)
Hemoglobin: 9.6 g/dL — ABNORMAL LOW (ref 12.0–15.0)
Immature Granulocytes: 1 %
Lymphocytes Relative: 18 %
Lymphs Abs: 1.3 10*3/uL (ref 0.7–4.0)
MCH: 30.3 pg (ref 26.0–34.0)
MCHC: 33.3 g/dL (ref 30.0–36.0)
MCV: 90.9 fL (ref 80.0–100.0)
Monocytes Absolute: 0.7 10*3/uL (ref 0.1–1.0)
Monocytes Relative: 10 %
Neutro Abs: 4.6 10*3/uL (ref 1.7–7.7)
Neutrophils Relative %: 66 %
Platelets: 379 10*3/uL (ref 150–400)
RBC: 3.17 MIL/uL — ABNORMAL LOW (ref 3.87–5.11)
RDW: 17.4 % — ABNORMAL HIGH (ref 11.5–15.5)
WBC: 7 10*3/uL (ref 4.0–10.5)
nRBC: 0 % (ref 0.0–0.2)

## 2019-11-17 LAB — MAGNESIUM: Magnesium: 2 mg/dL (ref 1.7–2.4)

## 2019-11-17 MED ORDER — SODIUM CHLORIDE 0.9 % IV SOLN
10.0000 mg | Freq: Once | INTRAVENOUS | Status: AC
  Administered 2019-11-17: 10 mg via INTRAVENOUS
  Filled 2019-11-17: qty 10

## 2019-11-17 MED ORDER — SODIUM CHLORIDE 0.9 % IV SOLN
Freq: Once | INTRAVENOUS | Status: AC
  Filled 2019-11-17: qty 250

## 2019-11-17 MED ORDER — FAMOTIDINE IN NACL 20-0.9 MG/50ML-% IV SOLN
20.0000 mg | Freq: Once | INTRAVENOUS | Status: AC
  Administered 2019-11-17: 20 mg via INTRAVENOUS
  Filled 2019-11-17: qty 50

## 2019-11-17 MED ORDER — HEPARIN SOD (PORK) LOCK FLUSH 100 UNIT/ML IV SOLN
500.0000 [IU] | Freq: Once | INTRAVENOUS | Status: AC | PRN
  Administered 2019-11-17: 500 [IU]
  Filled 2019-11-17: qty 5

## 2019-11-17 MED ORDER — SODIUM CHLORIDE 0.9 % IV SOLN
80.0000 mg/m2 | Freq: Once | INTRAVENOUS | Status: AC
  Administered 2019-11-17: 150 mg via INTRAVENOUS
  Filled 2019-11-17: qty 25

## 2019-11-17 MED ORDER — HEPARIN SOD (PORK) LOCK FLUSH 100 UNIT/ML IV SOLN
INTRAVENOUS | Status: AC
  Filled 2019-11-17: qty 5

## 2019-11-17 MED ORDER — DIPHENHYDRAMINE HCL 50 MG/ML IJ SOLN
50.0000 mg | Freq: Once | INTRAMUSCULAR | Status: AC
  Administered 2019-11-17: 50 mg via INTRAVENOUS
  Filled 2019-11-17: qty 1

## 2019-11-17 NOTE — Progress Notes (Signed)
HR 102 and BP 155/99 Ok to proceed per MD with tx today

## 2019-11-17 NOTE — Progress Notes (Signed)
Patient denied any problems or concerns but did request boost or ensure.

## 2019-11-17 NOTE — Progress Notes (Signed)
Per Darci Current, RN per Dr. Tasia Catchings, patient okay to receive treatment with additional IVF with BP being 155/99 and HR 102  1206: BP 169/97 HR 106; Dr. Tasia Catchings and team made aware. Dr. Tasia Catchings advised patient to monitor BP at home and contact PCP should it remain high. Okay to discharge home per Dr. Tasia Catchings. Educated patient on s/s as to when she should go to ED. Patient verbalizes understanding and denies any further questions or concerns.

## 2019-11-17 NOTE — Progress Notes (Signed)
Hematology/Oncology follow up note [facility] Telephone:([phone]) [phone] Fax:([phone]) [phone]   Patient Care Team: Lavera Guise, MD as PCP - General (Internal Medicine) Kate Sable, MD as PCP - Cardiology (Cardiology) Rico Junker, RN as Registered Nurse Theodore Demark, RN as Registered Nurse Earlie Server, MD as Consulting Physician (Oncology)  REFERRING PROVIDER: Lavera Guise, MD  CHIEF COMPLAINTS/REASON FOR VISIT:  Follow-up for breast cancer  HISTORY OF PRESENTING ILLNESS:   Dawn Alvarez is a  64 y.o.  female with PMH listed below was seen in consultation at the request of  Lavera Guise, MD  for evaluation of breast cancer Patient had screening mammogram done 08/12/2017 which showed right breast asymmetry.  A diagnostic mammogram was suggested and patient did not have it done. Patient felt her right breast mass for a few weeks.  She had bilateral diagnostic mammogram done on 06/30/2019. 2.8 x 2.4 x 2.6 cm right breast mass, 11:00, 10 cm from the nipple.  There is a single mildly abnormal node in the right axilla with a cortex measuring up to 4.4 mm.  No other suspicious findings. Patient underwent ultrasound-guided core biopsy of the right breast mass and right axilla lymph node Pathology showed invasive mammary carcinoma, no special type, grade 3, ER/PR HER-2 status are pending. Right axillary lymph node biopsy showed predominantly blood in the fibroadipose tissue, with scant lymphoid tissue present.  No definite malignancy was identified.  Patient was referred to cancer center to establish care and discuss treatment plan. Menarche 55 Postmenopausal.  LMP when she was 64 years old. She recalls use of birth control pills. Denies any hormone .  Replacement therapy. Denies any prior chest radiation. She reports family history of maternal grandmother and 2 maternal cousins were diagnosed with cancer.  She does not know about details. # 09/03/2019,  patient had an episode of syncope and EMS was called and patient sent to emergency room.  Patient was noted to have a heart rate 45-65 and blood glucose level of 286. Patient denies any history of nausea, vomiting, diarrhea.  Denies any dehydration.  She reported history of intermittent abdominal pain. CT head without contrast showed no acute intracranial abnormality.  CT abdomen pelvis with contrast is negative for evidence of acute abdominal abnormality.  Patient was given IV fluid. Patient was observed with holding her blood pressure medication including amlodipine, bisoprolol.  No arrhythmia was noted on telemetry.  Echocardiogram showed LVEF 60 to 65%.  No valvular abnormalities.  Beta-blocker and diuretics was discontinued.  # Patient was seen by cardiology Dr. Garen Lah for evaluation of syncope and was cleared for chemotherapy. # 10/03/2019, interval unilateral right diagnostic mammogram showed right breast mass 11:00 size has decreased to 3.7 x 1.3 x 2.6 cm.  Previously mass measured 2.8 x 2.4 x 2.6 cm  INTERVAL HISTORY Dawn Alvarez is a 64 y.o. female who has above history reviewed by me today presents for follow up visit for management of breast cancer, evaluation prior to chemotherapy. Problems and complaints are listed below: Patient denies any potential concerns today or side effects after last Taxol treatments. Today she is here accompanied by her husband. Denies any nausea vomiting diarrhea. Denies any numbness or tingling sensation. She reports right hip pain when she walks, no prior trauma Does not have much appetite. . Review of Systems  Constitutional: Positive for appetite change. Negative for chills, fatigue and fever.  HENT:   Negative for hearing loss and voice change.  Eyes: Negative for eye problems.  Respiratory: Negative for chest tightness and cough.   Cardiovascular: Negative for chest pain.  Gastrointestinal: Negative for abdominal distention, abdominal  pain and blood in stool.  Endocrine: Negative for hot flashes.  Genitourinary: Negative for difficulty urinating and frequency.   Musculoskeletal: Positive for arthralgias.  Skin: Negative for itching and rash.  Neurological: Negative for extremity weakness and numbness.  Hematological: Negative for adenopathy.  Psychiatric/Behavioral: Negative for confusion.    MEDICAL HISTORY:  Past Medical History:  Diagnosis Date  . Asthma   . Cancer ([place])    breast  . COPD exacerbation ([place]) 04/12/2016  . Diabetes mellitus without complication ([place])   . Hyperlipemia   . Hypertension   . Personal history of chemotherapy     SURGICAL HISTORY: Past Surgical History:  Procedure Laterality Date  . BREAST BIOPSY Right 07/05/2019   [place] bx venus marker, grade 3 invasive mammary carcinoma  . BREAST BIOPSY Right 07/05/2019   LN bx, hydromarker,PREDOMINANTLY BLOOD AND FIBROADIPOSE TISSUE, WITH SCANT LYMPHOID TISSUE PRESENT      . CESAREAN SECTION    . COLONOSCOPY WITH PROPOFOL N/A 09/11/2015   Procedure: COLONOSCOPY WITH PROPOFOL;  Surgeon: Lucilla Lame, MD;  Location: [facility] ENDOSCOPY;  Service: Endoscopy;  Laterality: N/A;  . IR IMAGING GUIDED PORT INSERTION  07/20/2019    SOCIAL HISTORY: Social History   Socioeconomic History  . Marital status: Married    Spouse name: Not on file  . Number of children: Not on file  . Years of education: Not on file  . Highest education level: Not on file  Occupational History  . Not on file  Tobacco Use  . Smoking status: Former Smoker    Packs/day: 1.00    Years: 2.00    Pack years: 2.00    Types: Cigarettes    Quit date: 05/19/2019    Years since quitting: 0.4  . Smokeless tobacco: Never Used  Substance and Sexual Activity  . Alcohol use: Not Currently  . Drug use: No  . Sexual activity: Never    Birth control/protection: Abstinence  Other Topics Concern  . Not on file  Social History Narrative  . Not on file   Social Determinants of Health     Financial Resource Strain:   . Difficulty of Paying Living Expenses:   Food Insecurity:   . Worried About Charity fundraiser in the Last Year:   . Arboriculturist in the Last Year:   Transportation Needs:   . Film/video editor (Medical):   Marland Kitchen Lack of Transportation (Non-Medical):   Physical Activity:   . Days of Exercise per Week:   . Minutes of Exercise per Session:   Stress:   . Feeling of Stress :   Social Connections:   . Frequency of Communication with Friends and Family:   . Frequency of Social Gatherings with Friends and Family:   . Attends Religious Services:   . Active Member of Clubs or Organizations:   . Attends Archivist Meetings:   Marland Kitchen Marital Status:   Intimate Partner Violence:   . Fear of Current or Ex-Partner:   . Emotionally Abused:   Mar[place] Kitchen Physically Abused:   . Sexually Abused:     FAMILY HISTORY: Family History  Problem Relation Age of Onset  . Diabetes Mother   . Hypertension Mother   . Hypertension Father   . Diabetes Father     ALLERGIES:  has No Known Allergies.  MEDICATIONS:  Current Outpatient Medications  Medication Sig Dispense Refill  . Alcohol Swabs (B-D SINGLE USE SWABS REGULAR) PADS Use as directed twice a day 300 each 3  . amLODipine (NORVASC) 10 MG tablet Take 10 mg by mouth once.    Marland Kitchen amLODipine (NORVASC) 5 MG tablet Take one tab po qd for htn 30 tablet 3  . Blood Glucose Monitoring Suppl (TRUE METRIX AIR GLUCOSE METER) w/Device KIT 1 Device by Does not apply route in the morning and at bedtime. Use ad directed e11.65 1 kit 0  . brimonidine (ALPHAGAN) 0.2 % ophthalmic solution Place 1 drop into both eyes 2 (two) times daily.    Marland Kitchen glucose blood (TRUE METRIX BLOOD GLUCOSE TEST) test strip Use as instructed twice a daily diag e11.65 100 each 3  . insulin detemir (LEVEMIR FLEXTOUCH) 100 UNIT/ML FlexPen Inject 44 Units into the skin daily. 15 mL 3  . Insulin Pen Needle (PEN NEEDLES) 31G X 8 MM MISC Use as directed with  insulin E11.65 100 each 1  . Lancets Misc. (ACCU-CHEK MULTICLIX LANCET DEV) KIT by Does not apply route. Use as directed twice a day diag E11.65    . lidocaine-prilocaine (EMLA) cream     . Omega-3 Fatty Acids (FISH OIL) 1000 MG CAPS Take 1,000 mg by mouth daily.     . potassium chloride (KLOR-CON) 10 MEQ tablet Take 1 tablet (10 mEq total) by mouth daily. 30 tablet 0  . prochlorperazine (COMPAZINE) 10 MG tablet Take 1 tablet (10 mg total) by mouth every 6 (six) hours as needed for nausea or vomiting. 60 tablet 1  . Semaglutide, 1 MG/DOSE, (OZEMPIC, 1 MG/DOSE,) 2 MG/1.5ML SOPN Inject 1 mg into the skin once a week. 5 pen 3  . SIMBRINZA 1-0.2 % SUSP     . TRUEplus Lancets 30G MISC Use as directed twice daily diag e11.65 100 each 3   No current facility-administered medications for this visit.   Facility-Administered Medications Ordered in Other Visits  Medication Dose Route Frequency Provider Last Rate Last Admin  . sodium chloride flush (NS) 0.9 % injection 10 mL  10 mL Intravenous PRN Earlie Server, MD   10 mL at 10/11/19 0817     PHYSICAL EXAMINATION: ECOG PERFORMANCE STATUS: 1 - Symptomatic but completely ambulatory Vitals:   11/17/19 0839  BP: (!) 184/99  Pulse: (!) 102  Resp: 20  Temp: 98.4 F (36.9 C)  SpO2: 100%   Filed Weights   11/17/19 0839  Weight: 161 lb 4.8 oz (73.2 kg)    Physical Exam Constitutional:      General: She is not in acute distress. HENT:     Head: Normocephalic and atraumatic.  Eyes:     General: No scleral icterus. Cardiovascular:     Rate and Rhythm: Regular rhythm. Tachycardia present.     Heart sounds: Normal heart sounds.  Pulmonary:     Effort: Pulmonary effort is normal. No respiratory distress.     Breath sounds: Normal breath sounds. No wheezing.  Abdominal:     General: Bowel sounds are normal. There is no distension.     Palpations: Abdomen is soft.  Musculoskeletal:        General: No deformity. Normal range of motion.     Cervical  back: Normal range of motion and neck supple.  Skin:    General: Skin is warm and dry.     Findings: No erythema or rash.  Neurological:     Mental Status: She is alert and oriented  to person, place, and time. Mental status is at baseline.     Cranial Nerves: No cranial nerve deficit.     Coordination: Coordination normal.  Psychiatric:        Mood and Affect: Mood normal.      LABORATORY DATA:  I have reviewed the data as listed Lab Results  Component Value Date   WBC 7.0 11/17/2019   HGB 9.6 (L) 11/17/2019   HCT 28.8 (L) 11/17/2019   MCV 90.9 11/17/2019   PLT 379 11/17/2019   Recent Labs    11/03/19 0904 11/10/19 0829 11/17/19 0824  NA 139 137 137  K 3.1* 3.5 4.1  CL 106 103 103  CO2 '25 24 24  ' GLUCOSE 126* 162* 80  BUN 10 7* 13  CREATININE 0.63 0.76 0.72  CALCIUM 9.0 9.2 9.1  GFRNONAA >60 >60 >60  GFRAA >60 >60 >60  PROT 6.7 7.0 7.9  ALBUMIN 3.5 3.6 3.7  AST '18 18 18  ' ALT '14 13 12  ' ALKPHOS 25* 31* 32*  BILITOT 0.6 0.5 0.6   Iron/TIBC/Ferritin/ %Sat No results found for: IRON, TIBC, FERRITIN, IRONPCTSAT    RADIOGRAPHIC STUDIES: I have personally reviewed the radiological images as listed and agreed with the findings in the report. CT Head Wo Contrast  Result Date: 09/03/2019 CLINICAL DATA:  Encephalopathy. EXAM: CT HEAD WITHOUT CONTRAST TECHNIQUE: Contiguous axial images were obtained from the base of the skull through the vertex without intravenous contrast. COMPARISON:  Head CT 07/30/2019 FINDINGS: Brain: Brain volume is normal for age. No intracranial hemorrhage, mass effect, or midline shift. No hydrocephalus. The basilar cisterns are patent. Unchanged periventricular and deep chronic small vessel ischemia. Punctate lacunar infarct in the right thalamus. No evidence of territorial infarct or acute ischemia. No extra-axial or intracranial fluid collection. Vascular: No hyperdense vessel. Skull: Normal. Negative for fracture or focal lesion. Sinuses/Orbits:  Paranasal sinuses and mastoid air cells are clear. The visualized orbits are unremarkable. Other: None. IMPRESSION: 1. No acute intracranial abnormality. 2. Stable chronic small vessel ischemia from prior. Electronically Signed   By: Keith Rake M.D.   On: 09/03/2019 19:53   CT ABDOMEN PELVIS W CONTRAST  Result Date: 09/03/2019 CLINICAL DATA:  Nausea vomiting. EXAM: CT ABDOMEN AND PELVIS WITH CONTRAST TECHNIQUE: Multidetector CT imaging of the abdomen and pelvis was performed using the standard protocol following bolus administration of intravenous contrast. CONTRAST:  132m OMNIPAQUE IOHEXOL 300 MG/ML  SOLN COMPARISON:  April 20, 2016 FINDINGS: Lower chest: No acute abnormality. Hepatobiliary: No focal liver abnormality is seen. No gallstones, gallbladder wall thickening, or biliary dilatation. Pancreas: Unremarkable. No pancreatic ductal dilatation or surrounding inflammatory changes. Spleen: Normal in size without focal abnormality. Adrenals/Urinary Tract: Adrenal glands are unremarkable. Kidneys are normal, without renal calculi, solid lesion, or hydronephrosis. 2 cm right renal cyst. Bladder is unremarkable. Stomach/Bowel: Stomach is within normal limits. Appendix appears normal. No evidence of bowel wall thickening, distention, or inflammatory changes. Vascular/Lymphatic: Aortic atherosclerosis. No enlarged abdominal or pelvic lymph nodes. Reproductive: Numerous some calcified myometrial masses. Other: No abdominal wall hernia or abnormality. No abdominopelvic ascites. Musculoskeletal: L4-L5 spondylosis. IMPRESSION: 1. No evidence of acute abnormalities within the solid abdominal organs. 2. Myometrial fibroids. Aortic Atherosclerosis (ICD10-I70.0). Electronically Signed   By: DFidela SalisburyM.D.   On: 09/03/2019 19:54   [place]ast Limited Uni Right Inc Axilla  Result Date: 10/03/2019 CLINICAL DATA:  Ultrasound-guided core biopsy of mass in the RIGHT breast 10-11 o'clock location performed  07/05/2019, showing grade 3 invasive  mammary carcinoma. Ultrasound-guided core biopsy of RIGHT axillary lymph node was benign and concordant. Patient has undergone neoadjuvant treatment scans feels the mass has gotten smaller. Assess for response to treatment. EXAM: DIGITAL DIAGNOSTIC RIGHT MAMMOGRAM WITH CAD AND TOMO ULTRASOUND RIGHT BREAST COMPARISON:  07/05/2019 and earlier ACR Breast Density Category b: There are scattered areas of fibroglandular density. FINDINGS: Within the UPPER-OUTER QUADRANT of the RIGHT breast there is a spiculated mass, marked with a tissue marking clip. Lesion appears smaller compared to prior study. No new suspicious findings in the RIGHT breast. Mammographic images were processed with CAD. Targeted ultrasound is performed, showingirregular hypoechoic mass in the 11 o'clock location of the RIGHT breast 10 centimeters from the nipple, measuring 2.7 x 1.3 x 2.6 centimeters. Previously mass measured 2.8 x 2.4 x 2.6 centimeters. IMPRESSION: Smaller mass in the 11 o'clock location of the RIGHT breast. RECOMMENDATION: Treatment plan for known RIGHT breast cancer. I have discussed the findings and recommendations with the patient. If applicable, a reminder letter will be sent to the patient regarding the next appointment. BI-RADS CATEGORY  6: Known biopsy-proven malignancy. Electronically Signed   By: Nolon Nations M.D.   On: 10/03/2019 14:36   MM DIAG BREAST TOMO UNI RIGHT  Result Date: 10/03/2019 CLINICAL DATA:  Ultrasound-guided core biopsy of mass in the RIGHT breast 10-11 o'clock location performed 07/05/2019, showing grade 3 invasive mammary carcinoma. Ultrasound-guided core biopsy of RIGHT axillary lymph node was benign and concordant. Patient has undergone neoadjuvant treatment scans feels the mass has gotten smaller. Assess for response to treatment. EXAM: DIGITAL DIAGNOSTIC RIGHT MAMMOGRAM WITH CAD AND TOMO ULTRASOUND RIGHT BREAST COMPARISON:  07/05/2019 and earlier ACR Breast  Density Category b: There are scattered areas of fibroglandular density. FINDINGS: Within the UPPER-OUTER QUADRANT of the RIGHT breast there is a spiculated mass, marked with a tissue marking clip. Lesion appears smaller compared to prior study. No new suspicious findings in the RIGHT breast. Mammographic images were processed with CAD. Targeted ultrasound is performed, showingirregular hypoechoic mass in the 11 o'clock location of the RIGHT breast 10 centimeters from the nipple, measuring 2.7 x 1.3 x 2.6 centimeters. Previously mass measured 2.8 x 2.4 x 2.6 centimeters. IMPRESSION: Smaller mass in the 11 o'clock location of the RIGHT breast. RECOMMENDATION: Treatment plan for known RIGHT breast cancer. I have discussed the findings and recommendations with the patient. If applicable, a reminder letter will be sent to the patient regarding the next appointment. BI-RADS CATEGORY  6: Known biopsy-proven malignancy. Electronically Signed   By: Nolon Nations M.D.   On: 10/03/2019 14:36   ECHOCARDIOGRAM COMPLETE  Result Date: 09/04/2019    ECHOCARDIOGRAM REPORT   Patient Name:   KEYUANA WANK Date of Exam: 09/04/2019 Medical Rec #:  [id number]        Height:       61.0 in Accession #:    [id number]       Weight:       169.2 lb Date of Birth:  07-03-1955        BSA:          1.759 m Patient Age:    64 years         BP:           175/83 mmHg Patient Gender: F                HR:           67 bpm. Exam Location:  [facility] Procedure: 2D Echo  Indications:     Syncope 780.2/ R55  History:         Patient has no prior history of Echocardiogram examinations.  Sonographer:     Arville Go RDCS Referring Phys:  [place] Diagnosing Phys: Ida Rogue MD IMPRESSIONS  1. Left ventricular ejection fraction, by estimation, is 60 to 65%. The left ventricle has normal function. The left ventricle has no regional wall motion abnormalities. There is mild left ventricular hypertrophy. Left ventricular diastolic parameters  were normal.  2. Right ventricular systolic function is normal. The right ventricular size is normal. Tricuspid regurgitation signal is inadequate for assessing PA pressure. FINDINGS  Left Ventricle: Left ventricular ejection fraction, by estimation, is 60 to 65%. The left ventricle has normal function. The left ventricle has no regional wall motion abnormalities. The left ventricular internal cavity size was normal in size. There is  mild left ventricular hypertrophy. Left ventricular diastolic parameters were normal. Right Ventricle: The right ventricular size is normal. No increase in right ventricular wall thickness. Right ventricular systolic function is normal. Tricuspid regurgitation signal is inadequate for assessing PA pressure. Left Atrium: Left atrial size was normal in size. Right Atrium: Right atrial size was normal in size. Pericardium: There is no evidence of pericardial effusion. Mitral Valve: The mitral valve is normal in structure. Normal mobility of the mitral valve leaflets. Trivial mitral valve regurgitation. No evidence of mitral valve stenosis. Tricuspid Valve: The tricuspid valve is normal in structure. Tricuspid valve regurgitation is not demonstrated. No evidence of tricuspid stenosis. Aortic Valve: The aortic valve is normal in structure. Aortic valve regurgitation is not visualized. No aortic stenosis is present. Aortic valve mean gradient measures 7.0 mmHg. Aortic valve peak gradient measures 13.5 mmHg. Aortic valve area, by VTI measures 1.48 cm. Pulmonic Valve: The pulmonic valve was normal in structure. Pulmonic valve regurgitation is not visualized. No evidence of pulmonic stenosis. Aorta: The aortic root is normal in size and structure. Venous: The inferior vena cava is normal in size with greater than 50% respiratory variability, suggesting right atrial pressure of 3 mmHg. IAS/Shunts: No atrial level shunt detected by color flow Doppler.  LEFT VENTRICLE PLAX 2D LVIDd:         4.11  cm  Diastology LVIDs:         2.51 cm  LV e' lateral:   9.25 cm/s LV PW:         1.35 cm  LV E/e' lateral: 11.8 LV IVS:        1.17 cm  LV e' medial:    7.51 cm/s LVOT diam:     1.90 cm  LV E/e' medial:  14.5 LV SV:         65 LV SV Index:   37 LVOT Area:     2.84 cm  RIGHT VENTRICLE RV Basal diam:  2.39 cm RV S prime:     19.00 cm/s TAPSE (M-mode): 2.1 cm LEFT ATRIUM             Index       RIGHT ATRIUM           Index LA diam:        3.30 cm 1.88 cm/m  RA Area:     10.60 cm LA Vol (A2C):   41.0 ml 23.31 ml/m RA Volume:   22.90 ml  13.02 ml/m LA Vol (A4C):   40.2 ml 22.85 ml/m LA Biplane Vol: 42.6 ml 24.22 ml/m  AORTIC VALVE  PULMONIC VALVE AV Area (Vmax):    1.59 cm     PV Vmax:       0.84 m/s AV Area (Vmean):   1.59 cm     PV Peak grad:  2.8 mmHg AV Area (VTI):     1.48 cm AV Vmax:           184.00 cm/s AV Vmean:          117.000 cm/s AV VTI:            0.441 m AV Peak Grad:      13.5 mmHg AV Mean Grad:      7.0 mmHg LVOT Vmax:         103.00 cm/s LVOT Vmean:        65.500 cm/s LVOT VTI:          0.230 m LVOT/AV VTI ratio: 0.52  AORTA Ao Root diam: 2.60 cm MITRAL VALVE MV Area (PHT): 3.12 cm     SHUNTS MV Decel Time: 243 msec     Systemic VTI:  0.23 m MV E velocity: 109.00 cm/s  Systemic Diam: 1.90 cm MV A velocity: 85.90 cm/s MV E/A ratio:  1.27 Ida Rogue MD Electronically signed by Ida Rogue MD Signature Date/Time: 09/04/2019/1:50:56 PM    Final    LONG TERM MONITOR (3-14 DAYS)  Result Date: 10/14/2019 Patient had a min HR of 58 bpm, max HR of 176 bpm, and avg HR of 88 bpm. Predominant underlying rhythm was Sinus Rhythm.  Occasional atrial tachycardia runs noted. . Isolated SVEs were rare (<1.0%).  No significant arrhythmias noted. Overall benign cardiac monitor.     ASSESSMENT & PLAN:  1. Malignant neoplasm of upper-outer quadrant of right breast in female, estrogen receptor negative ([place])   2. Encounter for antineoplastic chemotherapy   3. Antineoplastic  chemotherapy induced anemia   4. Dehydration   5. Hip pain, acute, right   6. Hypokalemia    cT2N0 grade 3 invasive mammary carcinoma.  ER negative, PR weakly positive (<=10%), HER-2 negative Status post 4 cycles of ddAC with growth factor support. Patient is currently on weekly Taxol.  She tolerates well. Labs are reviewed and discussed with patient. Proceed with Taxol today.  #Tachycardia, likely secondary to decreased oral intake/dehydration. Patient will receive 1 L of IV fluid normal saline along with her chemotherapy today.  #Right hip pain, likely secondary to osteoarthritis. Will obtain x-ray of right hip.  Chemotherapy-induced anemia.  Hemoglobin is 9.6 continue to monitor, Chronic hypokalemia, continue potassium chloride 10 mEq daily.  Potassium has normalized.  All questions were answered. The patient knows to call the clinic with any problems questions or concerns.   Return of visit: 1 week. Earlie Server, MD, PhD Hematology Oncology [facility] Pager- [phone] 11/17/2019

## 2019-11-24 ENCOUNTER — Other Ambulatory Visit: Payer: Self-pay

## 2019-11-24 ENCOUNTER — Inpatient Hospital Stay: Payer: Medicare HMO

## 2019-11-24 ENCOUNTER — Inpatient Hospital Stay (HOSPITAL_BASED_OUTPATIENT_CLINIC_OR_DEPARTMENT_OTHER): Payer: Medicare HMO | Admitting: Oncology

## 2019-11-24 ENCOUNTER — Encounter: Payer: Self-pay | Admitting: Oncology

## 2019-11-24 VITALS — BP 152/95 | HR 93 | Temp 97.6°F | Resp 20 | Wt 158.1 lb

## 2019-11-24 DIAGNOSIS — T451X5A Adverse effect of antineoplastic and immunosuppressive drugs, initial encounter: Secondary | ICD-10-CM | POA: Diagnosis not present

## 2019-11-24 DIAGNOSIS — Z171 Estrogen receptor negative status [ER-]: Secondary | ICD-10-CM

## 2019-11-24 DIAGNOSIS — D6481 Anemia due to antineoplastic chemotherapy: Secondary | ICD-10-CM

## 2019-11-24 DIAGNOSIS — C50411 Malignant neoplasm of upper-outer quadrant of right female breast: Secondary | ICD-10-CM

## 2019-11-24 DIAGNOSIS — G629 Polyneuropathy, unspecified: Secondary | ICD-10-CM | POA: Diagnosis not present

## 2019-11-24 DIAGNOSIS — Z79899 Other long term (current) drug therapy: Secondary | ICD-10-CM | POA: Diagnosis not present

## 2019-11-24 DIAGNOSIS — L729 Follicular cyst of the skin and subcutaneous tissue, unspecified: Secondary | ICD-10-CM | POA: Diagnosis not present

## 2019-11-24 DIAGNOSIS — M25551 Pain in right hip: Secondary | ICD-10-CM

## 2019-11-24 DIAGNOSIS — Z5111 Encounter for antineoplastic chemotherapy: Secondary | ICD-10-CM

## 2019-11-24 DIAGNOSIS — E876 Hypokalemia: Secondary | ICD-10-CM

## 2019-11-24 LAB — COMPREHENSIVE METABOLIC PANEL
ALT: 16 U/L (ref 0–44)
AST: 20 U/L (ref 15–41)
Albumin: 3.7 g/dL (ref 3.5–5.0)
Alkaline Phosphatase: 31 U/L — ABNORMAL LOW (ref 38–126)
Anion gap: 10 (ref 5–15)
BUN: 9 mg/dL (ref 8–23)
CO2: 23 mmol/L (ref 22–32)
Calcium: 9.1 mg/dL (ref 8.9–10.3)
Chloride: 103 mmol/L (ref 98–111)
Creatinine, Ser: 0.86 mg/dL (ref 0.44–1.00)
GFR calc Af Amer: >60 mL/min (ref >60)
GFR calc non Af Amer: >60 mL/min (ref >60)
Glucose, Bld: 106 mg/dL — ABNORMAL HIGH (ref 70–99)
Potassium: 3.5 mmol/L (ref 3.5–5.1)
Sodium: 136 mmol/L (ref 135–145)
Total Bilirubin: 0.5 mg/dL (ref 0.3–1.2)
Total Protein: 7.5 g/dL (ref 6.5–8.1)

## 2019-11-24 LAB — CBC WITH DIFFERENTIAL/PLATELET
Abs Immature Granulocytes: 0.04 10*3/uL (ref 0.00–0.07)
Basophils Absolute: 0.1 10*3/uL (ref 0.0–0.1)
Basophils Relative: 1 %
Eosinophils Absolute: 0.2 10*3/uL (ref 0.0–0.5)
Eosinophils Relative: 4 %
HCT: 27.8 % — ABNORMAL LOW (ref 36.0–46.0)
Hemoglobin: 9.2 g/dL — ABNORMAL LOW (ref 12.0–15.0)
Immature Granulocytes: 1 %
Lymphocytes Relative: 29 %
Lymphs Abs: 1.3 10*3/uL (ref 0.7–4.0)
MCH: 30.5 pg (ref 26.0–34.0)
MCHC: 33.1 g/dL (ref 30.0–36.0)
MCV: 92.1 fL (ref 80.0–100.0)
Monocytes Absolute: 0.5 10*3/uL (ref 0.1–1.0)
Monocytes Relative: 12 %
Neutro Abs: 2.4 10*3/uL (ref 1.7–7.7)
Neutrophils Relative %: 53 %
Platelets: 368 10*3/uL (ref 150–400)
RBC: 3.02 MIL/uL — ABNORMAL LOW (ref 3.87–5.11)
RDW: 16.3 % — ABNORMAL HIGH (ref 11.5–15.5)
Smear Review: NORMAL
WBC: 4.5 10*3/uL (ref 4.0–10.5)
nRBC: 0.4 % — ABNORMAL HIGH (ref 0.0–0.2)

## 2019-11-24 LAB — MAGNESIUM: Magnesium: 1.9 mg/dL (ref 1.7–2.4)

## 2019-11-24 MED ORDER — SODIUM CHLORIDE 0.9 % IV SOLN
Freq: Once | INTRAVENOUS | Status: AC
  Filled 2019-11-24: qty 250

## 2019-11-24 MED ORDER — POTASSIUM CHLORIDE ER 10 MEQ PO TBCR: 10.0000 meq | EXTENDED_RELEASE_TABLET | Freq: Every day | ORAL | 0 refills | Status: DC

## 2019-11-24 MED ORDER — HEPARIN SOD (PORK) LOCK FLUSH 100 UNIT/ML IV SOLN
500.0000 [IU] | Freq: Once | INTRAVENOUS | Status: AC | PRN
  Administered 2019-11-24: 500 [IU]
  Filled 2019-11-24: qty 5

## 2019-11-24 MED ORDER — SODIUM CHLORIDE 0.9 % IV SOLN
10.0000 mg | Freq: Once | INTRAVENOUS | Status: AC
  Administered 2019-11-24: 10 mg via INTRAVENOUS
  Filled 2019-11-24: qty 10

## 2019-11-24 MED ORDER — GABAPENTIN 100 MG PO CAPS: 100.0000 mg | ORAL_CAPSULE | Freq: Two times a day (BID) | ORAL | 0 refills | Status: DC

## 2019-11-24 MED ORDER — DIPHENHYDRAMINE HCL 50 MG/ML IJ SOLN
50.0000 mg | Freq: Once | INTRAMUSCULAR | Status: AC
  Administered 2019-11-24: 50 mg via INTRAVENOUS
  Filled 2019-11-24: qty 1

## 2019-11-24 MED ORDER — FAMOTIDINE IN NACL 20-0.9 MG/50ML-% IV SOLN
20.0000 mg | Freq: Once | INTRAVENOUS | Status: AC
  Administered 2019-11-24: 20 mg via INTRAVENOUS
  Filled 2019-11-24: qty 50

## 2019-11-24 MED ORDER — SODIUM CHLORIDE 0.9 % IV SOLN
80.0000 mg/m2 | Freq: Once | INTRAVENOUS | Status: AC
  Administered 2019-11-24: 150 mg via INTRAVENOUS
  Filled 2019-11-24: qty 25

## 2019-11-24 MED ORDER — HEPARIN SOD (PORK) LOCK FLUSH 100 UNIT/ML IV SOLN
INTRAVENOUS | Status: AC
  Filled 2019-11-24: qty 5

## 2019-11-24 NOTE — Progress Notes (Signed)
Hematology/Oncology follow up note [facility] Telephone:([phone] Fax:([phone]) [phone]   Patient Care Team: Lavera Guise, MD as PCP - General (Internal Medicine) Kate Sable, MD as PCP - Cardiology (Cardiology) Rico Junker, RN as Registered Nurse Theodore Demark, RN as Registered Nurse Earlie Server, MD as Consulting Physician (Oncology)  REFERRING PROVIDER: Lavera Guise, MD  CHIEF COMPLAINTS/REASON FOR VISIT:  Follow-up for breast cancer  HISTORY OF PRESENTING ILLNESS:   Dawn Alvarez is a  64 y.o.  female with PMH listed below was seen in consultation at the request of  Lavera Guise, MD  for evaluation of breast cancer Patient had screening mammogram done 08/12/2017 which showed right breast asymmetry.  A diagnostic mammogram was suggested and patient did not have it done. Patient felt her right breast mass for a few weeks.  She had bilateral diagnostic mammogram done on 06/30/2019. 2.8 x 2.4 x 2.6 cm right breast mass, 11:00, 10 cm from the nipple.  There is a single mildly abnormal node in the right axilla with a cortex measuring up to 4.4 mm.  No other suspicious findings. Patient underwent ultrasound-guided core biopsy of the right breast mass and right axilla lymph node Pathology showed invasive mammary carcinoma, no special type, grade 3, ER/PR HER-2 status are pending. Right axillary lymph node biopsy showed predominantly blood in the fibroadipose tissue, with scant lymphoid tissue present.  No definite malignancy was identified.  Patient was referred to cancer center to establish care and discuss treatment plan. Menarche 52 Postmenopausal.  LMP when she was 64 years old. She recalls use of birth control pills. Denies any hormone .  Replacement therapy. Denies any prior chest radiation. She reports family history of maternal grandmother and 2 maternal cousins were diagnosed with cancer.  She does not know about details. # 09/03/2019,  patient had an episode of syncope and EMS was called and patient sent to emergency room.  Patient was noted to have a heart rate 45-65 and blood glucose level of 286. Patient denies any history of nausea, vomiting, diarrhea.  Denies any dehydration.  She reported history of intermittent abdominal pain. CT head without contrast showed no acute intracranial abnormality.  CT abdomen pelvis with contrast is negative for evidence of acute abdominal abnormality.  Patient was given IV fluid. Patient was observed with holding her blood pressure medication including amlodipine, bisoprolol.  No arrhythmia was noted on telemetry.  Echocardiogram showed LVEF 60 to 65%.  No valvular abnormalities.  Beta-blocker and diuretics was discontinued.  # Patient was seen by cardiology Dr. Garen Lah for evaluation of syncope and was cleared for chemotherapy. # 10/03/2019, interval unilateral right diagnostic mammogram showed right breast mass 11:00 size has decreased to 3.7 x 1.3 x 2.6 cm.  Previously mass measured 2.8 x 2.4 x 2.6 cm  INTERVAL HISTORY Dawn Alvarez is a 64 y.o. female who has above history reviewed by me today presents for follow up visit for management of breast cancer, evaluation prior to chemotherapy. Problems and complaints are listed below: Denies any nausea vomiting diarrhea. She started feeling right hand numbness, intermittent, worsening with cold temperature exposure. Denies any numbness or tingling sensation of left hand and bilateral lower extremities. She noticed area of swelling around her right eye for a few days, swelling has improved today. Also noticed a small  bump on the left lower abdomen skin Right hip pain, intermittent sharp pain. Chronic.  Marland Kitchen Review of Systems  Constitutional: Negative for appetite  change, chills, fatigue and fever.  HENT:   Negative for hearing loss and voice change.        An area of swelling near her right eye  Eyes: Negative for eye problems.    Respiratory: Negative for chest tightness and cough.   Cardiovascular: Negative for chest pain.  Gastrointestinal: Negative for abdominal distention, abdominal pain and blood in stool.  Endocrine: Negative for hot flashes.  Genitourinary: Negative for difficulty urinating and frequency.   Musculoskeletal: Positive for arthralgias.  Skin: Negative for itching and rash.  Neurological: Negative for extremity weakness and numbness.  Hematological: Negative for adenopathy.  Psychiatric/Behavioral: Negative for confusion.    MEDICAL HISTORY:  Past Medical History:  Diagnosis Date  . Asthma   . Cancer ([place])    breast  . COPD exacerbation ([place]) 04/12/2016  . Diabetes mellitus without complication ([place])   . Hyperlipemia   . Hypertension   . Personal history of chemotherapy     SURGICAL HISTORY: Past Surgical History:  Procedure Laterality Date  . BREAST BIOPSY Right 07/05/2019   [place] bx venus marker, grade 3 invasive mammary carcinoma  . BREAST BIOPSY Right 07/05/2019   LN bx, hydromarker,PREDOMINANTLY BLOOD AND FIBROADIPOSE TISSUE, WITH SCANT LYMPHOID TISSUE PRESENT      . CESAREAN SECTION    . COLONOSCOPY WITH PROPOFOL N/A 09/11/2015   Procedure: COLONOSCOPY WITH PROPOFOL;  Surgeon: Lucilla Lame, MD;  Location: [facility] ENDOSCOPY;  Service: Endoscopy;  Laterality: N/A;  . IR IMAGING GUIDED PORT INSERTION  07/20/2019    SOCIAL HISTORY: Social History   Socioeconomic History  . Marital status: Married    Spouse name: Not on file  . Number of children: Not on file  . Years of education: Not on file  . Highest education level: Not on file  Occupational History  . Not on file  Tobacco Use  . Smoking status: Former Smoker    Packs/day: 1.00    Years: 2.00    Pack years: 2.00    Types: Cigarettes    Quit date: 05/19/2019    Years since quitting: 0.5  . Smokeless tobacco: Never Used  Substance and Sexual Activity  . Alcohol use: Not Currently  . Drug use: No  . Sexual activity:  Never    Birth control/protection: Abstinence  Other Topics Concern  . Not on file  Social History Narrative  . Not on file   Social Determinants of Health   Financial Resource Strain:   . Difficulty of Paying Living Expenses:   Food Insecurity:   . Worried About Charity fundraiser in the Last Year:   . Arboriculturist in the Last Year:   Transportation Needs:   . Film/video editor (Medical):   Marland Kitchen Lack of Transportation (Non-Medical):   Physical Activity:   . Days of Exercise per Week:   . Minutes of Exercise per Session:   Stress:   . Feeling of Stress :   Social Connections:   . Frequency of Communication with Friends and Family:   . Frequency of Social Gatherings with Friends and Family:   . Attends Religious Services:   . Active Member of Clubs or Organizations:   . Attends Archivist Meetings:   Marland Kitchen Marital Status:   Intimate Partner Violence:   . Fear of Current or Ex-Partner:   . Emotionally Abused:   Marland Kitchen Physically Abused:   . Sexually Abused:     FAMILY HISTORY: Family History  Problem Relation Age of  Onset  . Diabetes Mother   . Hypertension Mother   . Hypertension Father   . Diabetes Father     ALLERGIES:  has No Known Allergies.  MEDICATIONS:  Current Outpatient Medications  Medication Sig Dispense Refill  . Alcohol Swabs (B-D SINGLE USE SWABS REGULAR) PADS Use as directed twice a day 300 each 3  . amLODipine (NORVASC) 10 MG tablet Take 10 mg by mouth once.    Marland Kitchen amLODipine (NORVASC) 5 MG tablet Take one tab po qd for htn 30 tablet 3  . Blood Glucose Monitoring Suppl (TRUE METRIX AIR GLUCOSE METER) w/Device KIT 1 Device by Does not apply route in the morning and at bedtime. Use ad directed e11.65 1 kit 0  . brimonidine (ALPHAGAN) 0.2 % ophthalmic solution Place 1 drop into both eyes 2 (two) times daily.    Marland Kitchen glucose blood (TRUE METRIX BLOOD GLUCOSE TEST) test strip Use as instructed twice a daily diag e11.65 100 each 3  . insulin detemir  (LEVEMIR FLEXTOUCH) 100 UNIT/ML FlexPen Inject 44 Units into the skin daily. 15 mL 3  . Insulin Pen Needle (PEN NEEDLES) 31G X 8 MM MISC Use as directed with insulin E11.65 100 each 1  . Lancets Misc. (ACCU-CHEK MULTICLIX LANCET DEV) KIT by Does not apply route. Use as directed twice a day diag E11.65    . lidocaine-prilocaine (EMLA) cream     . Omega-3 Fatty Acids (FISH OIL) 1000 MG CAPS Take 1,000 mg by mouth daily.     . potassium chloride (KLOR-CON) 10 MEQ tablet Take 1 tablet (10 mEq total) by mouth daily. 30 tablet 0  . prochlorperazine (COMPAZINE) 10 MG tablet Take 1 tablet (10 mg total) by mouth every 6 (six) hours as needed for nausea or vomiting. 60 tablet 1  . Semaglutide, 1 MG/DOSE, (OZEMPIC, 1 MG/DOSE,) 2 MG/1.5ML SOPN Inject 1 mg into the skin once a week. 5 pen 3  . SIMBRINZA 1-0.2 % SUSP     . TRUEplus Lancets 30G MISC Use as directed twice daily diag e11.65 100 each 3   No current facility-administered medications for this visit.   Facility-Administered Medications Ordered in Other Visits  Medication Dose Route Frequency Provider Last Rate Last Admin  . sodium chloride flush (NS) 0.9 % injection 10 mL  10 mL Intravenous PRN Earlie Server, MD   10 mL at 10/11/19 0817     PHYSICAL EXAMINATION: ECOG PERFORMANCE STATUS: 1 - Symptomatic but completely ambulatory Vitals:   11/24/19 0903  BP: (!) 152/95  Pulse: 93  Resp: 20  Temp: 97.6 F (36.4 C)  SpO2: 100%   Filed Weights   11/24/19 0903  Weight: 158 lb 1.6 oz (71.7 kg)    Physical Exam Constitutional:      General: She is not in acute distress. HENT:     Head: Normocephalic and atraumatic.  Eyes:     General: No scleral icterus.    Comments: 1 cm cystic mass close to her right eye, not fixed and not tender.   Cardiovascular:     Rate and Rhythm: Regular rhythm. Tachycardia present.     Heart sounds: Normal heart sounds.  Pulmonary:     Effort: Pulmonary effort is normal. No respiratory distress.     Breath  sounds: Normal breath sounds. No wheezing.  Abdominal:     General: Bowel sounds are normal. There is no distension.     Palpations: Abdomen is soft.  Musculoskeletal:        General:  No deformity. Normal range of motion.     Cervical back: Normal range of motion and neck supple.  Skin:    General: Skin is warm and dry.     Findings: No erythema or rash.  Neurological:     Mental Status: She is alert and oriented to person, place, and time. Mental status is at baseline.     Cranial Nerves: No cranial nerve deficit.     Coordination: Coordination normal.  Psychiatric:        Mood and Affect: Mood normal.      LABORATORY DATA:  I have reviewed the data as listed Lab Results  Component Value Date   WBC 7.0 11/17/2019   HGB 9.6 (L) 11/17/2019   HCT 28.8 (L) 11/17/2019   MCV 90.9 11/17/2019   PLT 379 11/17/2019   Recent Labs    11/03/19 0904 11/10/19 0829 11/17/19 0824  NA 139 137 137  K 3.1* 3.5 4.1  CL 106 103 103  CO2 _0 GLUCOSE 126* 162* 80  BUN 10 7* 13  CREATININE 0.63 0.76 0.72  CALCIUM 9.0 9.2 9.1  GFRNONAA >60 >60 >60  GFRAA >60 >60 >60  PROT 6.7 7.0 7.9  ALBUMIN 3.5 3.6 3.7  AST _1 ALT _2 ALKPHOS 25* 31* 32*  BILITOT 0.6 0.5 0.6   Iron/TIBC/Ferritin/ %Sat No results found for: IRON, TIBC, FERRITIN, IRONPCTSAT    RADIOGRAPHIC STUDIES: I have personally reviewed the radiological images as listed and agreed with the findings in the report. CT Head Wo Contrast  Result Date: 09/03/2019 CLINICAL DATA:  Encephalopathy. EXAM: CT HEAD WITHOUT CONTRAST TECHNIQUE: Contiguous axial images were obtained from the base of the skull through the vertex without intravenous contrast. COMPARISON:  Head CT 07/30/2019 FINDINGS: Brain: Brain volume is normal for age. No intracranial hemorrhage, mass effect, or midline shift. No hydrocephalus. The basilar cisterns are patent. Unchanged periventricular and deep chronic small vessel ischemia. 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 No evidence of territorial infarct or acute ischemia. No extra-axial or intracranial fluid collection. Vascular: No hyperdense vessel. Skull: Normal. Negative for fracture or focal lesion. Sinuses/Orbits: Paranasal sinuses and mastoid air cells are clear. The visualized orbits are unremarkable. Other: None. IMPRESSION: 1. No acute intracranial abnormality. 2. Stable chronic small vessel ischemia from prior. Electronically Signed   By: Keith Rake M.D.   On: 09/03/2019 19:53   CT ABDOMEN PELVIS W CONTRAST  Result Date: 09/03/2019 CLINICAL DATA:  Nausea vomiting. EXAM: CT ABDOMEN AND PELVIS WITH CONTRAST TECHNIQUE: Multidetector CT imaging of the abdomen and pelvis was performed using the standard protocol following bolus administration of intravenous contrast. CONTRAST:  176m OMNIPAQUE IOHEXOL 300 MG/ML  SOLN COMPARISON:  April 20, 2016 FINDINGS: Lower chest: No acute abnormality. Hepatobiliary: No focal liver abnormality is seen. No gallstones, gallbladder wall thickening, or biliary dilatation. Pancreas: Unremarkable. No pancreatic ductal dilatation or surrounding inflammatory changes. Spleen: Normal in size without focal abnormality. Adrenals/Urinary Tract: Adrenal glands are unremarkable. Kidneys are normal, without renal calculi, solid lesion, or hydronephrosis. 2 cm right renal cyst. Bladder is unremarkable. Stomach/Bowel: Stomach is within normal limits. Appendix appears normal. No evidence of bowel wall thickening, distention, or inflammatory changes. Vascular/Lymphatic: Aortic atherosclerosis. No enlarged abdominal or pelvic lymph nodes. Reproductive: Numerous some calcified myometrial masses. Other: No abdominal wall hernia or abnormality. No abdominopelvic ascites. Musculoskeletal: L4-L5 spondylosis. IMPRESSION: 1. No evidence of acute abnormalities within the solid abdominal organs. 2. Myometrial fibroids.  Aortic Atherosclerosis (ICD10-I70.0).  Electronically Signed   By: Fidela Salisbury M.D.   On: 09/03/2019 19:54   US Breast Limited Uni Right Inc Axilla  Result Date: 10/03/2019 CLINICAL DATA:  Ultrasound-guided core biopsy of mass in the RIGHT breast 10-11 o'clock location performed 07/05/2019, showing grade 3 invasive mammary carcinoma. Ultrasound-guided core biopsy of RIGHT axillary lymph node was benign and concordant. Patient has undergone neoadjuvant treatment scans feels the mass has gotten smaller. Assess for response to treatment. EXAM: DIGITAL DIAGNOSTIC RIGHT MAMMOGRAM WITH CAD AND TOMO ULTRASOUND RIGHT BREAST COMPARISON:  07/05/2019 and earlier ACR Breast Density Category b: There are scattered areas of fibroglandular density. FINDINGS: Within the UPPER-OUTER QUADRANT of the RIGHT breast there is a spiculated mass, marked with a tissue marking clip. Lesion appears smaller compared to prior study. No new suspicious findings in the RIGHT breast. Mammographic images were processed with CAD. Targeted ultrasound is performed, showingirregular hypoechoic mass in the 11 o'clock location of the RIGHT breast 10 centimeters from the nipple, measuring 2.7 x 1.3 x 2.6 centimeters. Previously mass measured 2.8 x 2.4 x 2.6 centimeters. IMPRESSION: Smaller mass in the 11 o'clock location of the RIGHT breast. RECOMMENDATION: Treatment plan for known RIGHT breast cancer. I have discussed the findings and recommendations with the patient. If applicable, a reminder letter will be sent to the patient regarding the next appointment. BI-RADS CATEGORY  6: Known biopsy-proven malignancy. Electronically Signed   By: Nolon Nations M.D.   On: 10/03/2019 14:36   MM DIAG BREAST TOMO UNI RIGHT  Result Date: 10/03/2019 CLINICAL DATA:  Ultrasound-guided core biopsy of mass in the RIGHT breast 10-11 o'clock location performed 07/05/2019, showing grade 3 invasive mammary carcinoma. Ultrasound-guided core biopsy of RIGHT axillary lymph node was benign and  concordant. Patient has undergone neoadjuvant treatment scans feels the mass has gotten smaller. Assess for response to treatment. EXAM: DIGITAL DIAGNOSTIC RIGHT MAMMOGRAM WITH CAD AND TOMO ULTRASOUND RIGHT BREAST COMPARISON:  07/05/2019 and earlier ACR Breast Density Category b: There are scattered areas of fibroglandular density. FINDINGS: Within the UPPER-OUTER QUADRANT of the RIGHT breast there is a spiculated mass, marked with a tissue marking clip. Lesion appears smaller compared to prior study. No new suspicious findings in the RIGHT breast. Mammographic images were processed with CAD. Targeted ultrasound is performed, showingirregular hypoechoic mass in the 11 o'clock location of the RIGHT breast 10 centimeters from the nipple, measuring 2.7 x 1.3 x 2.6 centimeters. Previously mass measured 2.8 x 2.4 x 2.6 centimeters. IMPRESSION: Smaller mass in the 11 o'clock location of the RIGHT breast. RECOMMENDATION: Treatment plan for known RIGHT breast cancer. I have discussed the findings and recommendations with the patient. If applicable, a reminder letter will be sent to the patient regarding the next appointment. BI-RADS CATEGORY  6: Known biopsy-proven malignancy. Electronically Signed   By: Nolon Nations M.D.   On: 10/03/2019 14:36   ECHOCARDIOGRAM COMPLETE  Result Date: 09/04/2019    ECHOCARDIOGRAM REPORT   Patient Name:   Dawn Alvarez Date of Exam: 09/04/2019 Medical Rec #:  [id number]        Height:       61.0 in Accession #:    [id number]       Weight:       169.2 lb Date of Birth:  [birth year]        BSA:          1.759 m Patient Age:    19 years  BP:           175/83 mmHg Patient Gender: F                HR:           67 bpm. Exam Location:  [facility] Procedure: 2D Echo Indications:     Syncope 780.2/ R55  History:         Patient has no prior history of Echocardiogram examinations.  Sonographer:     Arville Go RDCS Referring Phys:  [place] Diagnosing Phys: Ida Rogue MD  IMPRESSIONS  1. Left ventricular ejection fraction, by estimation, is 60 to 65%. The left ventricle has normal function. The left ventricle has no regional wall motion abnormalities. There is mild left ventricular hypertrophy. Left ventricular diastolic parameters were normal.  2. Right ventricular systolic function is normal. The right ventricular size is normal. Tricuspid regurgitation signal is inadequate for assessing PA pressure. FINDINGS  Left Ventricle: Left ventricular ejection fraction, by estimation, is 60 to 65%. The left ventricle has normal function. The left ventricle has no regional wall motion abnormalities. The left ventricular internal cavity size was normal in size. There is  mild left ventricular hypertrophy. Left ventricular diastolic parameters were normal. Right Ventricle: The right ventricular size is normal. No increase in right ventricular wall thickness. Right ventricular systolic function is normal. Tricuspid regurgitation signal is inadequate for assessing PA pressure. Left Atrium: Left atrial size was normal in size. Right Atrium: Right atrial size was normal in size. Pericardium: There is no evidence of pericardial effusion. Mitral Valve: The mitral valve is normal in structure. Normal mobility of the mitral valve leaflets. Trivial mitral valve regurgitation. No evidence of mitral valve stenosis. Tricuspid Valve: The tricuspid valve is normal in structure. Tricuspid valve regurgitation is not demonstrated. No evidence of tricuspid stenosis. Aortic Valve: The aortic valve is normal in structure. Aortic valve regurgitation is not visualized. No aortic stenosis is present. Aortic valve mean gradient measures 7.0 mmHg. Aortic valve peak gradient measures 13.5 mmHg. Aortic valve area, by VTI measures 1.48 cm. Pulmonic Valve: The pulmonic valve was normal in structure. Pulmonic valve regurgitation is not visualized. No evidence of pulmonic stenosis. Aorta: The aortic root is normal in size  and structure. Venous: The inferior vena cava is normal in size with greater than 50% respiratory variability, suggesting right atrial pressure of 3 mmHg. IAS/Shunts: No atrial level shunt detected by color flow Doppler.  LEFT VENTRICLE PLAX 2D LVIDd:         4.11 cm  Diastology LVIDs:         2.51 cm  LV e' lateral:   9.25 cm/s LV PW:         1.35 cm  LV E/e' lateral: 11.8 LV IVS:        1.17 cm  LV e' medial:    7.51 cm/s LVOT diam:     1.90 cm  LV E/e' medial:  14.5 LV SV:         65 LV SV Index:   37 LVOT Area:     2.84 cm  RIGHT VENTRICLE RV Basal diam:  2.39 cm RV S prime:     19.00 cm/s TAPSE (M-mode): 2.1 cm LEFT ATRIUM             Index       RIGHT ATRIUM           Index LA diam:        3.30  cm 1.88 cm/m  RA Area:     10.60 cm LA Vol (A2C):   41.0 ml 23.31 ml/m RA Volume:   22.90 ml  13.02 ml/m LA Vol (A4C):   40.2 ml 22.85 ml/m LA Biplane Vol: 42.6 ml 24.22 ml/m  AORTIC VALVE                    PULMONIC VALVE AV Area (Vmax):    1.59 cm     PV Vmax:       0.84 m/s AV Area (Vmean):   1.59 cm     PV Peak grad:  2.8 mmHg AV Area (VTI):     1.48 cm AV Vmax:           184.00 cm/s AV Vmean:          117.000 cm/s AV VTI:            0.441 m AV Peak Grad:      13.5 mmHg AV Mean Grad:      7.0 mmHg LVOT Vmax:         103.00 cm/s LVOT Vmean:        65.500 cm/s LVOT VTI:          0.230 m LVOT/AV VTI ratio: 0.52  AORTA Ao Root diam: 2.60 cm MITRAL VALVE MV Area (PHT): 3.12 cm     SHUNTS MV Decel Time: 243 msec     Systemic VTI:  0.23 m MV E velocity: 109.00 cm/s  Systemic Diam: 1.90 cm MV A velocity: 85.90 cm/s MV E/A ratio:  1.27 Ida Rogue MD Electronically signed by Ida Rogue MD Signature Date/Time: 09/04/2019/1:50:56 PM    Final    DG HIP UNILAT WITH PELVIS MIN 4 VIEWS RIGHT  Result Date: 11/17/2019 CLINICAL DATA:  64 year old female with right hip pain. EXAM: DG HIP (WITH OR WITHOUT PELVIS) 4+V RIGHT COMPARISON:  CT of the abdomen pelvis dated 09/03/2019. FINDINGS: There is no acute fracture  or dislocation. The bones are mildly osteopenic. There is mild bilateral hip osteoarthritic changes. There is degenerative changes of the lower lumbar spine primarily at L4-L5 with endplate irregularity, disc degeneration and osteophyte. Calcified uterine fibroid noted over the pelvis. The soft tissues are unremarkable. Vascular calcifications noted. IMPRESSION: 1. No acute fracture or dislocation. 2. Mild bilateral hip osteoarthritic changes. Electronically Signed   By: Anner Crete M.D.   On: 11/17/2019 21:47   LONG TERM MONITOR (3-14 DAYS)  Result Date: 10/14/2019 Patient had a min HR of 58 bpm, max HR of 176 bpm, and avg HR of 88 bpm. Predominant underlying rhythm was Sinus Rhythm.  Occasional atrial tachycardia runs noted. . Isolated SVEs were rare (<1.0%).  No significant arrhythmias noted. Overall benign cardiac monitor.     ASSESSMENT & PLAN:  1. Malignant neoplasm of upper-outer quadrant of right breast in female, estrogen receptor negative ([place])   2. Encounter for antineoplastic chemotherapy   3. Antineoplastic chemotherapy induced anemia   4. Hip pain, acute, right   5. Neuropathy   6. Skin cyst    cT2N0 grade 3 invasive mammary carcinoma.  ER negative, PR weakly positive (<=10%), HER-2 negative Status post 4 cycles of ddAC with growth factor support. Patient is currently on weekly Taxol.   Overall she tolerates well.  Labs are reviewed and discussed with the patient. Proceed with Taxol treatments today.  #Tachycardia, resolved.  Normal heart rate today.  #Right hip pain, likely secondary to osteoarthritis. Hip x-ray showed no acute  fracture or dislocation.  Mild bilateral hip arthritis changes. Advise tylenol for pain.   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 Grade 1.  Recommend patient to try gabapentin 139m 1-2 times daily. Avoid cold temperature.  # Cyst close to her right eye, non tender and size has decreased.  ? [place]. Observation for now.    Chemotherapy-induced anemia.  Hemoglobin has slightly worsened to 9.2.   Continue to monitor.   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  Potassium has normalized.  All questions were answered. The patient knows to call the clinic with any problems questions or concerns.   Return of visit: 1 week. ZEarlie Server MD, PhD Hematology Oncology CKindred Hospital Baytownat AAllegheney Clinic Dba Wexford Surgery CenterPager- [phone]/24/2021

## 2019-11-28 ENCOUNTER — Ambulatory Visit: Payer: Medicare HMO | Admitting: Adult Health

## 2019-12-02 ENCOUNTER — Encounter: Payer: Self-pay | Admitting: Oncology

## 2019-12-02 ENCOUNTER — Inpatient Hospital Stay (HOSPITAL_BASED_OUTPATIENT_CLINIC_OR_DEPARTMENT_OTHER): Payer: Medicare HMO | Admitting: Oncology

## 2019-12-02 ENCOUNTER — Inpatient Hospital Stay: Payer: Medicare HMO

## 2019-12-02 ENCOUNTER — Other Ambulatory Visit: Payer: Self-pay

## 2019-12-02 ENCOUNTER — Inpatient Hospital Stay: Payer: Medicare HMO | Attending: Oncology

## 2019-12-02 VITALS — BP 121/71 | HR 91 | Temp 95.6°F | Resp 16 | Wt 163.5 lb

## 2019-12-02 DIAGNOSIS — G629 Polyneuropathy, unspecified: Secondary | ICD-10-CM

## 2019-12-02 DIAGNOSIS — Z006 Encounter for examination for normal comparison and control in clinical research program: Secondary | ICD-10-CM | POA: Diagnosis present

## 2019-12-02 DIAGNOSIS — C50411 Malignant neoplasm of upper-outer quadrant of right female breast: Secondary | ICD-10-CM | POA: Diagnosis not present

## 2019-12-02 DIAGNOSIS — D6481 Anemia due to antineoplastic chemotherapy: Secondary | ICD-10-CM

## 2019-12-02 DIAGNOSIS — Z5111 Encounter for antineoplastic chemotherapy: Secondary | ICD-10-CM | POA: Diagnosis not present

## 2019-12-02 DIAGNOSIS — E876 Hypokalemia: Secondary | ICD-10-CM | POA: Insufficient documentation

## 2019-12-02 DIAGNOSIS — G620 Drug-induced polyneuropathy: Secondary | ICD-10-CM | POA: Diagnosis not present

## 2019-12-02 DIAGNOSIS — T451X5A Adverse effect of antineoplastic and immunosuppressive drugs, initial encounter: Secondary | ICD-10-CM | POA: Diagnosis not present

## 2019-12-02 DIAGNOSIS — Z171 Estrogen receptor negative status [ER-]: Secondary | ICD-10-CM | POA: Insufficient documentation

## 2019-12-02 LAB — COMPREHENSIVE METABOLIC PANEL
ALT: 15 U/L (ref 0–44)
AST: 18 U/L (ref 15–41)
Albumin: 3.7 g/dL (ref 3.5–5.0)
Alkaline Phosphatase: 27 U/L — ABNORMAL LOW (ref 38–126)
Anion gap: 9 (ref 5–15)
BUN: 11 mg/dL (ref 8–23)
CO2: 23 mmol/L (ref 22–32)
Calcium: 8.8 mg/dL — ABNORMAL LOW (ref 8.9–10.3)
Chloride: 106 mmol/L (ref 98–111)
Creatinine, Ser: 0.86 mg/dL (ref 0.44–1.00)
GFR calc Af Amer: >60 mL/min (ref >60)
GFR calc non Af Amer: >60 mL/min (ref >60)
Glucose, Bld: 104 mg/dL — ABNORMAL HIGH (ref 70–99)
Potassium: 3.8 mmol/L (ref 3.5–5.1)
Sodium: 138 mmol/L (ref 135–145)
Total Bilirubin: 0.4 mg/dL (ref 0.3–1.2)
Total Protein: 7 g/dL (ref 6.5–8.1)

## 2019-12-02 LAB — CBC WITH DIFFERENTIAL/PLATELET
Abs Immature Granulocytes: 0.02 10*3/uL (ref 0.00–0.07)
Basophils Absolute: 0.1 10*3/uL (ref 0.0–0.1)
Basophils Relative: 1 %
Eosinophils Absolute: 0.2 10*3/uL (ref 0.0–0.5)
Eosinophils Relative: 3 %
HCT: 26.2 % — ABNORMAL LOW (ref 36.0–46.0)
Hemoglobin: 8.8 g/dL — ABNORMAL LOW (ref 12.0–15.0)
Immature Granulocytes: 0 %
Lymphocytes Relative: 26 %
Lymphs Abs: 1.5 10*3/uL (ref 0.7–4.0)
MCH: 31.2 pg (ref 26.0–34.0)
MCHC: 33.6 g/dL (ref 30.0–36.0)
MCV: 92.9 fL (ref 80.0–100.0)
Monocytes Absolute: 0.7 10*3/uL (ref 0.1–1.0)
Monocytes Relative: 12 %
Neutro Abs: 3.1 10*3/uL (ref 1.7–7.7)
Neutrophils Relative %: 58 %
Platelets: 320 10*3/uL (ref 150–400)
RBC: 2.82 MIL/uL — ABNORMAL LOW (ref 3.87–5.11)
RDW: 15.8 % — ABNORMAL HIGH (ref 11.5–15.5)
WBC: 5.5 10*3/uL (ref 4.0–10.5)
nRBC: 0 % (ref 0.0–0.2)

## 2019-12-02 LAB — MAGNESIUM: Magnesium: 1.7 mg/dL (ref 1.7–2.4)

## 2019-12-02 MED ORDER — SODIUM CHLORIDE 0.9 % IV SOLN
Freq: Once | INTRAVENOUS | Status: AC
  Filled 2019-12-02: qty 250

## 2019-12-02 MED ORDER — HEPARIN SOD (PORK) LOCK FLUSH 100 UNIT/ML IV SOLN
INTRAVENOUS | Status: AC
  Filled 2019-12-02: qty 5

## 2019-12-02 MED ORDER — HEPARIN SOD (PORK) LOCK FLUSH 100 UNIT/ML IV SOLN
500.0000 [IU] | Freq: Once | INTRAVENOUS | Status: AC | PRN
  Administered 2019-12-02: 500 [IU]
  Filled 2019-12-02: qty 5

## 2019-12-02 MED ORDER — SODIUM CHLORIDE 0.9% FLUSH
10.0000 mL | Freq: Once | INTRAVENOUS | Status: AC
  Administered 2019-12-02: 10 mL via INTRAVENOUS
  Filled 2019-12-02: qty 10

## 2019-12-02 MED ORDER — SODIUM CHLORIDE 0.9 % IV SOLN
10.0000 mg | Freq: Once | INTRAVENOUS | Status: AC
  Administered 2019-12-02: 10 mg via INTRAVENOUS
  Filled 2019-12-02: qty 10

## 2019-12-02 MED ORDER — SODIUM CHLORIDE 0.9 % IV SOLN
126.0000 mg | Freq: Once | INTRAVENOUS | Status: AC
  Administered 2019-12-02: 126 mg via INTRAVENOUS
  Filled 2019-12-02: qty 21

## 2019-12-02 MED ORDER — DIPHENHYDRAMINE HCL 50 MG/ML IJ SOLN
50.0000 mg | Freq: Once | INTRAMUSCULAR | Status: AC
  Administered 2019-12-02: 50 mg via INTRAVENOUS
  Filled 2019-12-02: qty 1

## 2019-12-02 MED ORDER — HEPARIN SOD (PORK) LOCK FLUSH 100 UNIT/ML IV SOLN
500.0000 [IU] | Freq: Once | INTRAVENOUS | Status: DC
  Filled 2019-12-02: qty 5

## 2019-12-02 MED ORDER — FAMOTIDINE IN NACL 20-0.9 MG/50ML-% IV SOLN
20.0000 mg | Freq: Once | INTRAVENOUS | Status: AC
  Administered 2019-12-02: 20 mg via INTRAVENOUS
  Filled 2019-12-02: qty 50

## 2019-12-02 NOTE — Progress Notes (Signed)
Patient here for follow up. No new concerns voiced. Ensures provided to patient per request.

## 2019-12-02 NOTE — Progress Notes (Signed)
Hematology/Oncology follow up note [facility] [phone] [phone]   Patient Care Team: Lavera Guise, MD as PCP - General (Internal Medicine) Kate Sable, MD as PCP - Cardiology (Cardiology) Rico Junker, RN as Registered Nurse Theodore Demark, RN as Registered Nurse Earlie Server, MD as Consulting Physician (Oncology)  REFERRING PROVIDER: Lavera Guise, MD  CHIEF COMPLAINTS/REASON FOR VISIT:  Follow-up for breast cancer  HISTORY OF PRESENTING ILLNESS:   Dawn Alvarez is a  64 y.o.  female with PMH listed below was seen in consultation at the request of  Lavera Guise, MD  for evaluation of breast cancer Patient had screening mammogram done 08/12/2017 which showed right breast asymmetry.  A diagnostic mammogram was suggested and patient did not have it done. Patient felt her right breast mass for a few weeks.  She had bilateral diagnostic mammogram done on 06/30/2019. 2.8 x 2.4 x 2.6 cm right breast mass, 11:00, 10 cm from the nipple.  There is a single mildly abnormal node in the right axilla with a cortex measuring up to 4.4 mm.  No other suspicious findings. Patient underwent ultrasound-guided core biopsy of the right breast mass and right axilla lymph node Pathology showed invasive mammary carcinoma, no special type, grade 3, ER/PR HER-2 status are pending. Right axillary lymph node biopsy showed predominantly blood in the fibroadipose tissue, with scant lymphoid tissue present.  No definite malignancy was identified.  Patient was referred to cancer center to establish care and discuss treatment plan. Menarche 63 Postmenopausal.  LMP when she was 64 years old. She recalls use of birth control pills. Denies any hormone .  Replacement therapy. Denies any prior chest radiation. She reports family history of maternal grandmother and 2 maternal cousins were diagnosed with cancer.  She does not know about details. # 09/03/2019,  patient had an episode of syncope and EMS was called and patient sent to emergency room.  Patient was noted to have a heart rate 45-65 and blood glucose level of 286. Patient denies any history of nausea, vomiting, diarrhea.  Denies any dehydration.  She reported history of intermittent abdominal pain. CT head without contrast showed no acute intracranial abnormality.  CT abdomen pelvis with contrast is negative for evidence of acute abdominal abnormality.  Patient was given IV fluid. Patient was observed with holding her blood pressure medication including amlodipine, bisoprolol.  No arrhythmia was noted on telemetry.  Echocardiogram showed LVEF 60 to 65%.  No valvular abnormalities.  Beta-blocker and diuretics was discontinued.  # Patient was seen by cardiology Dr. Garen Lah for evaluation of syncope and was cleared for chemotherapy. # 10/03/2019, interval unilateral right diagnostic mammogram showed right breast mass 11:00 size has decreased to 3.7 x 1.3 x 2.6 cm.  Previously mass measured 2.8 x 2.4 x 2.6 cm  INTERVAL HISTORY Dawn Alvarez is a 64 y.o. female who has above history reviewed by me today presents for follow up visit for management of breast cancer, evaluation prior to chemotherapy. Problems and complaints are listed below: Denies any nausea vomiting diarrhea.  Hand numbness and tingling sensation has improved after taking gabapentin 100 mg twice daily. Swelling around her right eye has resolved.  . Review of Systems  Constitutional: Negative for appetite change, chills, fatigue and fever.  HENT:   Negative for hearing loss and voice change.   Eyes: Negative for eye problems.  Respiratory: Negative for chest tightness and cough.   Cardiovascular: Negative for chest pain.  Gastrointestinal: Negative for abdominal distention, abdominal pain and blood in stool.  Endocrine: Negative for hot flashes.  Genitourinary: Negative for difficulty urinating and frequency.     Musculoskeletal: Positive for arthralgias.  Skin: Negative for itching and rash.  Neurological: Positive for numbness. Negative for extremity weakness.  Hematological: Negative for adenopathy.  Psychiatric/Behavioral: Negative for confusion.    MEDICAL HISTORY:  Past Medical History:  Diagnosis Date  . Asthma   . Cancer ([place])    breast  . COPD exacerbation ([place]) 04/12/2016  . Diabetes mellitus without complication ([place])   . Hyperlipemia   . Hypertension   . Personal history of chemotherapy     SURGICAL HISTORY: Past Surgical History:  Procedure Laterality Date  . BREAST BIOPSY Right 07/05/2019   [place] bx venus marker, grade 3 invasive mammary carcinoma  . BREAST BIOPSY Right 07/05/2019   LN bx, hydromarker,PREDOMINANTLY BLOOD AND FIBROADIPOSE TISSUE, WITH SCANT LYMPHOID TISSUE PRESENT      . CESAREAN SECTION    . COLONOSCOPY WITH PROPOFOL N/A 09/11/2015   Procedure: COLONOSCOPY WITH PROPOFOL;  Surgeon: Lucilla Lame, MD;  Location: [facility] ENDOSCOPY;  Service: Endoscopy;  Laterality: N/A;  . IR IMAGING GUIDED PORT INSERTION  07/20/2019    SOCIAL HISTORY: Social History   Socioeconomic History  . Marital status: Married    Spouse name: Not on file  . Number of children: Not on file  . Years of education: Not on file  . Highest education level: Not on file  Occupational History  . Not on file  Tobacco Use  . Smoking status: Former Smoker    Packs/day: 1.00    Years: 2.00    Pack years: 2.00    Types: Cigarettes    Quit date: 05/19/2019    Years since quitting: 0.5  . Smokeless tobacco: Never Used  Substance and Sexual Activity  . Alcohol use: Not Currently  . Drug use: No  . Sexual activity: Never    Birth control/protection: Abstinence  Other Topics Concern  . Not on file  Social History Narrative  . Not on file   Social Determinants of Health   Financial Resource Strain:   . Difficulty of Paying Living Expenses:   Food Insecurity:   . Worried About Paediatric nurse in the Last Year:   . Arboriculturist in the Last Year:   Transportation Needs:   . Film/video editor (Medical):   Marland Kitchen Lack of Transportation (Non-Medical):   Physical Activity:   . Days of Exercise per Week:   . Minutes of Exercise per Session:   Stress:   . Feeling of Stress :   Social Connections:   . Frequency of Communication with Friends and Family:   . Frequency of Social Gatherings with Friends and Family:   . Attends Religious Services:   . Active Member of Clubs or Organizations:   . Attends Archivist Meetings:   Marland Kitchen Marital Status:   Intimate Partner Violence:   . Fear of Current or Ex-Partner:   . Emotionally Abused:   Marland Kitchen Physically Abused:   . Sexually Abused:     FAMILY HISTORY: Family History  Problem Relation Age of Onset  . Diabetes Mother   . Hypertension Mother   . Hypertension Father   . Diabetes Father     ALLERGIES:  has No Known Allergies.  MEDICATIONS:  Current Outpatient Medications  Medication Sig Dispense Refill  . Alcohol Swabs (B-D SINGLE USE SWABS REGULAR) PADS Use  as directed twice a day 300 each 3  . amLODipine (NORVASC) 10 MG tablet Take 10 mg by mouth once.    . Blood Glucose Monitoring Suppl (TRUE METRIX AIR GLUCOSE METER) w/Device KIT 1 Device by Does not apply route in the morning and at bedtime. Use ad directed e11.65 1 kit 0  . brimonidine (ALPHAGAN) 0.2 % ophthalmic solution Place 1 drop into both eyes 2 (two) times daily.    Marland Kitchen gabapentin (NEURONTIN) 100 MG capsule Take 1 capsule (100 mg total) by mouth 2 (two) times daily. 60 capsule 0  . glucose blood (TRUE METRIX BLOOD GLUCOSE TEST) test strip Use as instructed twice a daily diag e11.65 100 each 3  . insulin detemir (LEVEMIR FLEXTOUCH) 100 UNIT/ML FlexPen Inject 44 Units into the skin daily. 15 mL 3  . Insulin Pen Needle (PEN NEEDLES) 31G X 8 MM MISC Use as directed with insulin E11.65 100 each 1  . Lancets Misc. (ACCU-CHEK MULTICLIX LANCET DEV) KIT by  Does not apply route. Use as directed twice a day diag E11.65    . lidocaine-prilocaine (EMLA) cream     . Omega-3 Fatty Acids (FISH OIL) 1000 MG CAPS Take 1,000 mg by mouth daily.     . potassium chloride (KLOR-CON) 10 MEQ tablet Take 1 tablet (10 mEq total) by mouth daily. 30 tablet 0  . prochlorperazine (COMPAZINE) 10 MG tablet Take 1 tablet (10 mg total) by mouth every 6 (six) hours as needed for nausea or vomiting. 60 tablet 1  . Semaglutide, 1 MG/DOSE, (OZEMPIC, 1 MG/DOSE,) 2 MG/1.5ML SOPN Inject 1 mg into the skin once a week. 5 pen 3  . SIMBRINZA 1-0.2 % SUSP     . TRUEplus Lancets 30G MISC Use as directed twice daily diag e11.65 100 each 3  . amLODipine (NORVASC) 5 MG tablet Take one tab po qd for htn (Patient not taking: Reported on 12/02/2019) 30 tablet 3   No current facility-administered medications for this visit.   Facility-Administered Medications Ordered in Other Visits  Medication Dose Route Frequency Provider Last Rate Last Admin  . heparin lock flush 100 unit/mL  500 Units Intravenous Once Earlie Server, MD      . sodium chloride flush (NS) 0.9 % injection 10 mL  10 mL Intravenous PRN Earlie Server, MD   10 mL at 10/11/19 0817     PHYSICAL EXAMINATION: ECOG PERFORMANCE STATUS: 1 - Symptomatic but completely ambulatory Vitals:   12/02/19 0846  BP: 121/71  Pulse: 91  Resp: 16  Temp: (!) 95.6 F (35.3 C)  SpO2: 100%   Filed Weights   12/02/19 0846  Weight: 163 lb 8 oz (74.2 kg)    Physical Exam Constitutional:      General: She is not in acute distress. HENT:     Head: Normocephalic and atraumatic.  Eyes:     General: No scleral icterus. Cardiovascular:     Rate and Rhythm: Normal rate and regular rhythm.     Heart sounds: Normal heart sounds.  Pulmonary:     Effort: Pulmonary effort is normal. No respiratory distress.     Breath sounds: Normal breath sounds. No wheezing.  Abdominal:     General: Bowel sounds are normal. There is no distension.     Palpations:  Abdomen is soft.  Musculoskeletal:        General: No deformity. Normal range of motion.     Cervical back: Normal range of motion and neck supple.  Skin:  General: Skin is warm and dry.     Findings: No erythema or rash.  Neurological:     Mental Status: She is alert and oriented to person, place, and time. Mental status is at baseline.     Cranial Nerves: No cranial nerve deficit.     Coordination: Coordination normal.  Psychiatric:        Mood and Affect: Mood normal.      LABORATORY DATA:  I have reviewed the data as listed Lab Results  Component Value Date   WBC 5.5 12/02/2019   HGB 8.8 (L) 12/02/2019   HCT 26.2 (L) 12/02/2019   MCV 92.9 12/02/2019   PLT 320 12/02/2019   Recent Labs    11/17/19 0824 11/24/19 0854 12/02/19 0829  NA 137 136 138  K 4.1 3.5 3.8  CL 103 103 106  CO2 _0 GLUCOSE 80 106* 104*  BUN _1 CREATININE 0.72 0.86 0.86  CALCIUM 9.1 9.1 8.8*  GFRNONAA >60 >60 >60  GFRAA >60 >60 >60  PROT 7.9 7.5 7.0  ALBUMIN 3.7 3.7 3.7  AST _2 ALT _3 ALKPHOS 32* 31* 27*  BILITOT 0.6 0.5 0.4   Iron/TIBC/Ferritin/ %Sat No results found for: IRON, TIBC, FERRITIN, IRONPCTSAT    RADIOGRAPHIC STUDIES: I have personally reviewed the radiological images as listed and agreed with the findings in the report. CT Head Wo Contrast  Result Date: 09/03/2019 CLINICAL DATA:  Encephalopathy. EXAM: CT HEAD WITHOUT CONTRAST TECHNIQUE: Contiguous axial images were obtained from the base of the skull through the vertex without intravenous contrast. COMPARISON:  Head CT 07/30/2019 FINDINGS: Brain: Brain volume is normal for age. No intracranial hemorrhage, mass effect, or midline shift. No hydrocephalus. The basilar cisterns are patent. Unchanged periventricular and deep chronic small vessel ischemia. Punctate lacunar infarct in the right thalamus. No evidence of territorial infarct or acute ischemia. No extra-axial or intracranial fluid collection.  Vascular: No hyperdense vessel. Skull: Normal. Negative for fracture or focal lesion. Sinuses/Orbits: Paranasal sinuses and mastoid air cells are clear. The visualized orbits are unremarkable. Other: None. IMPRESSION: 1. No acute intracranial abnormality. 2. Stable chronic small vessel ischemia from prior. Electronically Signed   By: Keith Rake M.D.   On: 09/03/2019 19:53   CT ABDOMEN PELVIS W CONTRAST  Result Date: 09/03/2019 CLINICAL DATA:  Nausea vomiting. EXAM: CT ABDOMEN AND PELVIS WITH CONTRAST TECHNIQUE: Multidetector CT imaging of the abdomen and pelvis was performed using the standard protocol following bolus administration of intravenous contrast. CONTRAST:  128m OMNIPAQUE IOHEXOL 300 MG/ML  SOLN COMPARISON:  April 20, 2016 FINDINGS: Lower chest: No acute abnormality. Hepatobiliary: No focal liver abnormality is seen. No gallstones, gallbladder wall thickening, or biliary dilatation. Pancreas: Unremarkable. No pancreatic ductal dilatation or surrounding inflammatory changes. Spleen: Normal in size without focal abnormality. Adrenals/Urinary Tract: Adrenal glands are unremarkable. Kidneys are normal, without renal calculi, solid lesion, or hydronephrosis. 2 cm right renal cyst. Bladder is unremarkable. Stomach/Bowel: Stomach is within normal limits. Appendix appears normal. No evidence of bowel wall thickening, distention, or inflammatory changes. Vascular/Lymphatic: Aortic atherosclerosis. No enlarged abdominal or pelvic lymph nodes. Reproductive: Numerous some calcified myometrial masses. Other: No abdominal wall hernia or abnormality. No abdominopelvic ascites. Musculoskeletal: L4-L5 spondylosis. IMPRESSION: 1. No evidence of acute abnormalities within the solid abdominal organs. 2. Myometrial fibroids. Aortic Atherosclerosis (ICD10-I70.0). Electronically Signed   By: DFidela SalisburyM.D.   On: 09/03/2019 19:54   [place]ast Limited Uni Right  Inc Axilla  Result Date: 10/03/2019 CLINICAL  DATA:  Ultrasound-guided core biopsy of mass in the RIGHT breast 10-11 o'clock location performed 07/05/2019, showing grade 3 invasive mammary carcinoma. Ultrasound-guided core biopsy of RIGHT axillary lymph node was benign and concordant. Patient has undergone neoadjuvant treatment scans feels the mass has gotten smaller. Assess for response to treatment. EXAM: DIGITAL DIAGNOSTIC RIGHT MAMMOGRAM WITH CAD AND TOMO ULTRASOUND RIGHT BREAST COMPARISON:  07/05/2019 and earlier ACR Breast Density Category b: There are scattered areas of fibroglandular density. FINDINGS: Within the UPPER-OUTER QUADRANT of the RIGHT breast there is a spiculated mass, marked with a tissue marking clip. Lesion appears smaller compared to prior study. No new suspicious findings in the RIGHT breast. Mammographic images were processed with CAD. Targeted ultrasound is performed, showingirregular hypoechoic mass in the 11 o'clock location of the RIGHT breast 10 centimeters from the nipple, measuring 2.7 x 1.3 x 2.6 centimeters. Previously mass measured 2.8 x 2.4 x 2.6 centimeters. IMPRESSION: Smaller mass in the 11 o'clock location of the RIGHT breast. RECOMMENDATION: Treatment plan for known RIGHT breast cancer. I have discussed the findings and recommendations with the patient. If applicable, a reminder letter will be sent to the patient regarding the next appointment. BI-RADS CATEGORY  6: Known biopsy-proven malignancy. Electronically Signed   By: Nolon Nations M.D.   On: 10/03/2019 14:36   MM DIAG BREAST TOMO UNI RIGHT  Result Date: 10/03/2019 CLINICAL DATA:  Ultrasound-guided core biopsy of mass in the RIGHT breast 10-11 o'clock location performed 07/05/2019, showing grade 3 invasive mammary carcinoma. Ultrasound-guided core biopsy of RIGHT axillary lymph node was benign and concordant. Patient has undergone neoadjuvant treatment scans feels the mass has gotten smaller. Assess for response to treatment. EXAM: DIGITAL DIAGNOSTIC RIGHT  MAMMOGRAM WITH CAD AND TOMO ULTRASOUND RIGHT BREAST COMPARISON:  07/05/2019 and earlier ACR Breast Density Category b: There are scattered areas of fibroglandular density. FINDINGS: Within the UPPER-OUTER QUADRANT of the RIGHT breast there is a spiculated mass, marked with a tissue marking clip. Lesion appears smaller compared to prior study. No new suspicious findings in the RIGHT breast. Mammographic images were processed with CAD. Targeted ultrasound is performed, showingirregular hypoechoic mass in the 11 o'clock location of the RIGHT breast 10 centimeters from the nipple, measuring 2.7 x 1.3 x 2.6 centimeters. Previously mass measured 2.8 x 2.4 x 2.6 centimeters. IMPRESSION: Smaller mass in the 11 o'clock location of the RIGHT breast. RECOMMENDATION: Treatment plan for known RIGHT breast cancer. I have discussed the findings and recommendations with the patient. If applicable, a reminder letter will be sent to the patient regarding the next appointment. BI-RADS CATEGORY  6: Known biopsy-proven malignancy. Electronically Signed   By: Nolon Nations M.D.   On: 10/03/2019 14:36   ECHOCARDIOGRAM COMPLETE  Result Date: 09/04/2019    ECHOCARDIOGRAM REPORT   Patient Name:   Dawn Alvarez Date of Exam: 09/04/2019 Medical Rec #:  [id number]        Height:       61.0 in Accession #:    [id number]       Weight:       169.2 lb Date of Birth:  Dec 14, 1955        BSA:          1.759 m Patient Age:    47 years         BP:           175/83 mmHg Patient Gender: F  HR:           67 bpm. Exam Location:  [facility] Procedure: 2D Echo Indications:     Syncope 780.2/ R55  History:         Patient has no prior history of Echocardiogram examinations.  Sonographer:     Arville Go RDCS Referring Phys:  [place] Diagnosing Phys: Ida Rogue MD IMPRESSIONS  1. Left ventricular ejection fraction, by estimation, is 60 to 65%. The left ventricle has normal function. The left ventricle has no regional wall motion  abnormalities. There is mild left ventricular hypertrophy. Left ventricular diastolic parameters were normal.  2. Right ventricular systolic function is normal. The right ventricular size is normal. Tricuspid regurgitation signal is inadequate for assessing PA pressure. FINDINGS  Left Ventricle: Left ventricular ejection fraction, by estimation, is 60 to 65%. The left ventricle has normal function. The left ventricle has no regional wall motion abnormalities. The left ventricular internal cavity size was normal in size. There is  mild left ventricular hypertrophy. Left ventricular diastolic parameters were normal. Right Ventricle: The right ventricular size is normal. No increase in right ventricular wall thickness. Right ventricular systolic function is normal. Tricuspid regurgitation signal is inadequate for assessing PA pressure. Left Atrium: Left atrial size was normal in size. Right Atrium: Right atrial size was normal in size. Pericardium: There is no evidence of pericardial effusion. Mitral Valve: The mitral valve is normal in structure. Normal mobility of the mitral valve leaflets. Trivial mitral valve regurgitation. No evidence of mitral valve stenosis. Tricuspid Valve: The tricuspid valve is normal in structure. Tricuspid valve regurgitation is not demonstrated. No evidence of tricuspid stenosis. Aortic Valve: The aortic valve is normal in structure. Aortic valve regurgitation is not visualized. No aortic stenosis is present. Aortic valve mean gradient measures 7.0 mmHg. Aortic valve peak gradient measures 13.5 mmHg. Aortic valve area, by VTI measures 1.48 cm. Pulmonic Valve: The pulmonic valve was normal in structure. Pulmonic valve regurgitation is not visualized. No evidence of pulmonic stenosis. Aorta: The aortic root is normal in size and structure. Venous: The inferior vena cava is normal in size with greater than 50% respiratory variability, suggesting right atrial pressure of 3 mmHg. IAS/Shunts:  No atrial level shunt detected by color flow Doppler.  LEFT VENTRICLE PLAX 2D LVIDd:         4.11 cm  Diastology LVIDs:         2.51 cm  LV e' lateral:   9.25 cm/s LV PW:         1.35 cm  LV E/e' lateral: 11.8 LV IVS:        1.17 cm  LV e' medial:    7.51 cm/s LVOT diam:     1.90 cm  LV E/e' medial:  14.5 LV SV:         65 LV SV Index:   37 LVOT Area:     2.84 cm  RIGHT VENTRICLE RV Basal diam:  2.39 cm RV S prime:     19.00 cm/s TAPSE (M-mode): 2.1 cm LEFT ATRIUM             Index       RIGHT ATRIUM           Index LA diam:        3.30 cm 1.88 cm/m  RA Area:     10.60 cm LA Vol (A2C):   41.0 ml 23.31 ml/m RA Volume:   22.90 ml  13.02 ml/m LA  Vol (A4C):   40.2 ml 22.85 ml/m LA Biplane Vol: 42.6 ml 24.22 ml/m  AORTIC VALVE                    PULMONIC VALVE AV Area (Vmax):    1.59 cm     PV Vmax:       0.84 m/s AV Area (Vmean):   1.59 cm     PV Peak grad:  2.8 mmHg AV Area (VTI):     1.48 cm AV Vmax:           184.00 cm/s AV Vmean:          117.000 cm/s AV VTI:            0.441 m AV Peak Grad:      13.5 mmHg AV Mean Grad:      7.0 mmHg LVOT Vmax:         103.00 cm/s LVOT Vmean:        65.500 cm/s LVOT VTI:          0.230 m LVOT/AV VTI ratio: 0.52  AORTA Ao Root diam: 2.60 cm MITRAL VALVE MV Area (PHT): 3.12 cm     SHUNTS MV Decel Time: 243 msec     Systemic VTI:  0.23 m MV E velocity: 109.00 cm/s  Systemic Diam: 1.90 cm MV A velocity: 85.90 cm/s MV E/A ratio:  1.27 Ida Rogue MD Electronically signed by Ida Rogue MD Signature Date/Time: 09/04/2019/1:50:56 PM    Final    DG HIP UNILAT WITH PELVIS MIN 4 VIEWS RIGHT  Result Date: 11/17/2019 CLINICAL DATA:  63 year old female with right hip pain. EXAM: DG HIP (WITH OR WITHOUT PELVIS) 4+V RIGHT COMPARISON:  CT of the abdomen pelvis dated 09/03/2019. FINDINGS: There is no acute fracture or dislocation. The bones are mildly osteopenic. There is mild bilateral hip osteoarthritic changes. There is degenerative changes of the lower lumbar spine primarily  at L4-L5 with endplate irregularity, disc degeneration and osteophyte. Calcified uterine fibroid noted over the pelvis. The soft tissues are unremarkable. Vascular calcifications noted. IMPRESSION: 1. No acute fracture or dislocation. 2. Mild bilateral hip osteoarthritic changes. Electronically Signed   By: Anner Crete M.D.   On: 11/17/2019 21:47   LONG TERM MONITOR (3-14 DAYS)  Result Date: 10/14/2019 Patient had a min HR of 58 bpm, max HR of 176 bpm, and avg HR of 88 bpm. Predominant underlying rhythm was Sinus Rhythm.  Occasional atrial tachycardia runs noted. . Isolated SVEs were rare (<1.0%).  No significant arrhythmias noted. Overall benign cardiac monitor.     ASSESSMENT & PLAN:  1. Encounter for antineoplastic chemotherapy   2. Antineoplastic chemotherapy induced anemia   3. Malignant neoplasm of upper-outer quadrant of right breast in female, estrogen receptor negative ([place])   4. Neuropathy    cT2N0 grade 3 invasive mammary carcinoma.  ER negative, PR weakly positive (<=10%), HER-2 negative Status post 4 cycles of ddAC with growth factor support. Patient is currently on weekly Taxol.  Overall she tolerates well with mild to moderate difficulties. Labs are reviewed and discussed with patient proceed with Taxol treatment today. I will dose decrease Taxol to 70 mg/m given her grade 2 neuropathy.  #Chemotherapy-induced neuropathy, continue gabapentin 100 mg twice daily #Chemotherapy-induced anemia.  Hemoglobin has gradually trended down to 8.8.  Continue to monitor. 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  Potassium has normalized.  All questions were answered. The patient knows to call the clinic with any  problems questions or concerns.   Return of visit: 1 week. Earlie Server, MD, PhD Hematology Oncology [facility] Pager- [phone] 12/02/2019

## 2019-12-08 ENCOUNTER — Ambulatory Visit (INDEPENDENT_AMBULATORY_CARE_PROVIDER_SITE_OTHER): Payer: Medicaid Other | Admitting: Adult Health

## 2019-12-08 ENCOUNTER — Other Ambulatory Visit: Payer: Self-pay

## 2019-12-08 ENCOUNTER — Encounter: Payer: Self-pay | Admitting: Adult Health

## 2019-12-08 VITALS — BP 140/76 | HR 92 | Temp 97.2°F | Resp 16 | Ht 64.0 in | Wt 166.4 lb

## 2019-12-08 DIAGNOSIS — R300 Dysuria: Secondary | ICD-10-CM

## 2019-12-08 DIAGNOSIS — N3000 Acute cystitis without hematuria: Secondary | ICD-10-CM

## 2019-12-08 LAB — POCT URINALYSIS DIPSTICK
Bilirubin, UA: NEGATIVE
Glucose, UA: NEGATIVE
Nitrite, UA: POSITIVE
Protein, UA: POSITIVE — AB
Spec Grav, UA: 1.015 (ref 1.010–1.025)
Urobilinogen, UA: 0.2 E.U./dL
pH, UA: 6 (ref 5.0–8.0)

## 2019-12-08 MED ORDER — NITROFURANTOIN MONOHYD MACRO 100 MG PO CAPS: 100.0000 mg | ORAL_CAPSULE | Freq: Two times a day (BID) | ORAL | 0 refills | Status: DC

## 2019-12-08 NOTE — Progress Notes (Signed)
[facility] [place], [place] [address]  Internal MEDICINE  Office Visit Note  Patient Name: Dawn Alvarez  [id number]  [id number]  Date of Service: 12/08/2019  Chief Complaint  Patient presents with  . Acute Visit    uti, hurts when urinating     HPI Pt is here for a sick visit.  Patient reports 4 days of dysuria and urinary frequency.  She denies any discharge, or hematuria.  No flank pain or abdominal pain.  She does have some slightly pressure with urinating.      Current Medication:  Outpatient Encounter Medications as of 12/08/2019  Medication Sig  . Alcohol Swabs (B-D SINGLE USE SWABS REGULAR) PADS Use as directed twice a day  . amLODipine (NORVASC) 10 MG tablet Take 10 mg by mouth once.  . Blood Glucose Monitoring Suppl (TRUE METRIX AIR GLUCOSE METER) w/Device KIT 1 Device by Does not apply route in the morning and at bedtime. Use ad directed e11.65  . brimonidine (ALPHAGAN) 0.2 % ophthalmic solution Place 1 drop into both eyes 2 (two) times daily.  Marland Kitchen gabapentin (NEURONTIN) 100 MG capsule Take 1 capsule (100 mg total) by mouth 2 (two) times daily.  Marland Kitchen glucose blood (TRUE METRIX BLOOD GLUCOSE TEST) test strip Use as instructed twice a daily diag e11.65  . insulin detemir (LEVEMIR FLEXTOUCH) 100 UNIT/ML FlexPen Inject 44 Units into the skin daily.  . Insulin Pen Needle (PEN NEEDLES) 31G X 8 MM MISC Use as directed with insulin E11.65  . Lancets Misc. (ACCU-CHEK MULTICLIX LANCET DEV) KIT by Does not apply route. Use as directed twice a day diag E11.65  . lidocaine-prilocaine (EMLA) cream   . Omega-3 Fatty Acids (FISH OIL) 1000 MG CAPS Take 1,000 mg by mouth daily.   . potassium chloride (KLOR-CON) 10 MEQ tablet Take 1 tablet (10 mEq total) by mouth daily.  . prochlorperazine (COMPAZINE) 10 MG tablet Take 1 tablet (10 mg total) by mouth every 6 (six) hours as needed for nausea or vomiting.  . Semaglutide, 1 MG/DOSE, (OZEMPIC, 1 MG/DOSE,) 2 MG/1.5ML  SOPN Inject 1 mg into the skin once a week.  Marland Kitchen SIMBRINZA 1-0.2 % SUSP   . TRUEplus Lancets 30G MISC Use as directed twice daily diag e11.65  . [DISCONTINUED] amLODipine (NORVASC) 5 MG tablet Take one tab po qd for htn (Patient not taking: Reported on 12/02/2019)   Facility-Administered Encounter Medications as of 12/08/2019  Medication  . sodium chloride flush (NS) 0.9 % injection 10 mL      Medical History: Past Medical History:  Diagnosis Date  . Asthma   . Cancer ([place])    breast  . COPD exacerbation ([place]) 04/12/2016  . Diabetes mellitus without complication ([place])   . Hyperlipemia   . Hypertension   . Personal history of chemotherapy      Vital Signs: BP 140/76   Pulse 92   Temp (!) 97.2 F (36.2 C)   Resp 16   Ht '5\' 4"'  (1.626 m)   Wt 166 lb 6.4 oz (75.5 kg)   SpO2 97%   BMI 28.56 kg/m    Review of Systems  Constitutional: Negative for chills, fatigue and unexpected weight change.  HENT: Negative for congestion, rhinorrhea, sneezing and sore throat.   Eyes: Negative for photophobia, pain and redness.  Respiratory: Negative for cough, chest tightness and shortness of breath.   Cardiovascular: Negative for chest pain and palpitations.  Gastrointestinal: Negative for abdominal pain, constipation, diarrhea, nausea and vomiting.  Endocrine: Negative.   Genitourinary: Positive for dysuria, frequency and urgency. Negative for hematuria.  Musculoskeletal: Negative for arthralgias, back pain, joint swelling and neck pain.  Skin: Negative for rash.  Allergic/Immunologic: Negative.   Neurological: Negative for tremors and numbness.  Hematological: Negative for adenopathy. Does not bruise/bleed easily.  Psychiatric/Behavioral: Negative for behavioral problems and sleep disturbance. The patient is not nervous/anxious.     Physical Exam Vitals and nursing note reviewed.  Constitutional:      General: She is not in acute distress.    Appearance: She is well-developed. She  is not diaphoretic.  HENT:     Head: Normocephalic and atraumatic.     Mouth/Throat:     Pharynx: No oropharyngeal exudate.  Eyes:     Pupils: Pupils are equal, round, and reactive to light.  Neck:     Thyroid: No thyromegaly.     Vascular: No JVD.     Trachea: No tracheal deviation.  Cardiovascular:     Rate and Rhythm: Normal rate and regular rhythm.     Heart sounds: Normal heart sounds. No murmur heard.  No friction rub. No gallop.   Pulmonary:     Effort: Pulmonary effort is normal. No respiratory distress.     Breath sounds: Normal breath sounds. No wheezing or rales.  Chest:     Chest wall: No tenderness.  Abdominal:     Palpations: Abdomen is soft.     Tenderness: There is no abdominal tenderness. There is no guarding.  Musculoskeletal:        General: Normal range of motion.     Cervical back: Normal range of motion and neck supple.  Lymphadenopathy:     Cervical: No cervical adenopathy.  Skin:    General: Skin is warm and dry.  Neurological:     Mental Status: She is alert and oriented to person, place, and time.     Cranial Nerves: No cranial nerve deficit.  Psychiatric:        Behavior: Behavior normal.        Thought Content: Thought content normal.        Judgment: Judgment normal.    Assessment/Plan: 1. Acute cystitis without hematuria Advised patient to take entire course of antibiotics as prescribed with food. Pt should return to clinic in 7-10 days if symptoms fail to improve or new symptoms develop.  - nitrofurantoin, macrocrystal-monohydrate, (MACROBID) 100 MG capsule; Take 1 capsule (100 mg total) by mouth 2 (two) times daily.  Dispense: 14 capsule; Refill: 0  2. Dysuria - POCT Urinalysis Dipstick - CULTURE, URINE COMPREHENSIVE  General Counseling: [name] verbalizes understanding of the findings of todays visit and agrees with plan of treatment. I have discussed any further diagnostic evaluation that may be needed or ordered today. We also reviewed  her medications today. she has been encouraged to call the office with any questions or concerns that should arise related to todays visit.   Orders Placed This Encounter  Procedures  . CULTURE, URINE COMPREHENSIVE  . POCT Urinalysis Dipstick    No orders of the defined types were placed in this encounter.   Time spent: 25 Minutes  This patient was seen by Orson Gear AGNP-C in Collaboration with Dr Lavera Guise as a part of collaborative care agreement.  Kendell Bane AGNP-C Internal Medicine

## 2019-12-09 ENCOUNTER — Inpatient Hospital Stay: Payer: Medicare HMO

## 2019-12-09 ENCOUNTER — Inpatient Hospital Stay (HOSPITAL_BASED_OUTPATIENT_CLINIC_OR_DEPARTMENT_OTHER): Payer: Medicare HMO | Admitting: Oncology

## 2019-12-09 ENCOUNTER — Encounter: Payer: Self-pay | Admitting: Oncology

## 2019-12-09 ENCOUNTER — Encounter: Payer: Self-pay | Admitting: *Deleted

## 2019-12-09 VITALS — BP 122/77 | HR 81 | Temp 96.3°F | Resp 18 | Wt 164.9 lb

## 2019-12-09 DIAGNOSIS — C50411 Malignant neoplasm of upper-outer quadrant of right female breast: Secondary | ICD-10-CM

## 2019-12-09 DIAGNOSIS — Z5111 Encounter for antineoplastic chemotherapy: Secondary | ICD-10-CM

## 2019-12-09 DIAGNOSIS — E876 Hypokalemia: Secondary | ICD-10-CM

## 2019-12-09 DIAGNOSIS — Z006 Encounter for examination for normal comparison and control in clinical research program: Secondary | ICD-10-CM | POA: Diagnosis not present

## 2019-12-09 DIAGNOSIS — Z171 Estrogen receptor negative status [ER-]: Secondary | ICD-10-CM

## 2019-12-09 DIAGNOSIS — D6481 Anemia due to antineoplastic chemotherapy: Secondary | ICD-10-CM

## 2019-12-09 DIAGNOSIS — G629 Polyneuropathy, unspecified: Secondary | ICD-10-CM | POA: Diagnosis not present

## 2019-12-09 DIAGNOSIS — Z95828 Presence of other vascular implants and grafts: Secondary | ICD-10-CM

## 2019-12-09 DIAGNOSIS — T451X5A Adverse effect of antineoplastic and immunosuppressive drugs, initial encounter: Secondary | ICD-10-CM

## 2019-12-09 DIAGNOSIS — G620 Drug-induced polyneuropathy: Secondary | ICD-10-CM | POA: Diagnosis not present

## 2019-12-09 LAB — COMPREHENSIVE METABOLIC PANEL
ALT: 13 U/L (ref 0–44)
AST: 17 U/L (ref 15–41)
Albumin: 3.4 g/dL — ABNORMAL LOW (ref 3.5–5.0)
Alkaline Phosphatase: 29 U/L — ABNORMAL LOW (ref 38–126)
Anion gap: 9 (ref 5–15)
BUN: 11 mg/dL (ref 8–23)
CO2: 26 mmol/L (ref 22–32)
Calcium: 8.8 mg/dL — ABNORMAL LOW (ref 8.9–10.3)
Chloride: 102 mmol/L (ref 98–111)
Creatinine, Ser: 0.8 mg/dL (ref 0.44–1.00)
GFR calc Af Amer: >60 mL/min (ref >60)
GFR calc non Af Amer: >60 mL/min (ref >60)
Glucose, Bld: 160 mg/dL — ABNORMAL HIGH (ref 70–99)
Potassium: 3.3 mmol/L — ABNORMAL LOW (ref 3.5–5.1)
Sodium: 137 mmol/L (ref 135–145)
Total Bilirubin: 0.3 mg/dL (ref 0.3–1.2)
Total Protein: 6.5 g/dL (ref 6.5–8.1)

## 2019-12-09 LAB — CBC WITH DIFFERENTIAL/PLATELET
Abs Immature Granulocytes: 0.03 10*3/uL (ref 0.00–0.07)
Basophils Absolute: 0.1 10*3/uL (ref 0.0–0.1)
Basophils Relative: 1 %
Eosinophils Absolute: 0.2 10*3/uL (ref 0.0–0.5)
Eosinophils Relative: 3 %
HCT: 25.6 % — ABNORMAL LOW (ref 36.0–46.0)
Hemoglobin: 8.7 g/dL — ABNORMAL LOW (ref 12.0–15.0)
Immature Granulocytes: 1 %
Lymphocytes Relative: 23 %
Lymphs Abs: 1.3 10*3/uL (ref 0.7–4.0)
MCH: 31.1 pg (ref 26.0–34.0)
MCHC: 34 g/dL (ref 30.0–36.0)
MCV: 91.4 fL (ref 80.0–100.0)
Monocytes Absolute: 0.6 10*3/uL (ref 0.1–1.0)
Monocytes Relative: 10 %
Neutro Abs: 3.7 10*3/uL (ref 1.7–7.7)
Neutrophils Relative %: 62 %
Platelets: 298 10*3/uL (ref 150–400)
RBC: 2.8 MIL/uL — ABNORMAL LOW (ref 3.87–5.11)
RDW: 15.6 % — ABNORMAL HIGH (ref 11.5–15.5)
WBC: 5.8 10*3/uL (ref 4.0–10.5)
nRBC: 0 % (ref 0.0–0.2)

## 2019-12-09 LAB — MAGNESIUM: Magnesium: 1.9 mg/dL (ref 1.7–2.4)

## 2019-12-09 MED ORDER — DIPHENHYDRAMINE HCL 50 MG/ML IJ SOLN
50.0000 mg | Freq: Once | INTRAMUSCULAR | Status: AC
  Administered 2019-12-09: 50 mg via INTRAVENOUS
  Filled 2019-12-09: qty 1

## 2019-12-09 MED ORDER — SODIUM CHLORIDE 0.9% FLUSH
10.0000 mL | INTRAVENOUS | Status: DC | PRN
  Administered 2019-12-09: 10 mL via INTRAVENOUS
  Filled 2019-12-09: qty 10

## 2019-12-09 MED ORDER — FAMOTIDINE IN NACL 20-0.9 MG/50ML-% IV SOLN
20.0000 mg | Freq: Once | INTRAVENOUS | Status: AC
  Administered 2019-12-09: 20 mg via INTRAVENOUS
  Filled 2019-12-09: qty 50

## 2019-12-09 MED ORDER — SODIUM CHLORIDE 0.9 % IV SOLN
10.0000 mg | Freq: Once | INTRAVENOUS | Status: AC
  Administered 2019-12-09: 10 mg via INTRAVENOUS
  Filled 2019-12-09: qty 10

## 2019-12-09 MED ORDER — HEPARIN SOD (PORK) LOCK FLUSH 100 UNIT/ML IV SOLN
INTRAVENOUS | Status: AC
  Filled 2019-12-09: qty 5

## 2019-12-09 MED ORDER — SODIUM CHLORIDE 0.9 % IV SOLN
Freq: Once | INTRAVENOUS | Status: AC
  Filled 2019-12-09: qty 250

## 2019-12-09 MED ORDER — POTASSIUM CHLORIDE 20 MEQ/100ML IV SOLN
20.0000 meq | Freq: Once | INTRAVENOUS | Status: AC
  Administered 2019-12-09: 20 meq via INTRAVENOUS

## 2019-12-09 MED ORDER — HEPARIN SOD (PORK) LOCK FLUSH 100 UNIT/ML IV SOLN
500.0000 [IU] | Freq: Once | INTRAVENOUS | Status: AC | PRN
  Administered 2019-12-09: 500 [IU]
  Filled 2019-12-09: qty 5

## 2019-12-09 MED ORDER — SODIUM CHLORIDE 0.9 % IV SOLN
67.0000 mg/m2 | Freq: Once | INTRAVENOUS | Status: AC
  Administered 2019-12-09: 126 mg via INTRAVENOUS
  Filled 2019-12-09: qty 21

## 2019-12-09 NOTE — Progress Notes (Signed)
Hematology/Oncology follow up note [facility] [phone] [phone]   Patient Care Team: Lavera Guise, MD as PCP - General (Internal Medicine) Kate Sable, MD as PCP - Cardiology (Cardiology) Rico Junker, RN as Registered Nurse Theodore Demark, RN as Registered Nurse Earlie Server, MD as Consulting Physician (Oncology)  REFERRING PROVIDER: Lavera Guise, MD  CHIEF COMPLAINTS/REASON FOR VISIT:  Follow-up for breast cancer  HISTORY OF PRESENTING ILLNESS:   Dawn Alvarez is a  64 y.o.  female with PMH listed below was seen in consultation at the request of  Lavera Guise, MD  for evaluation of breast cancer Patient had screening mammogram done 08/12/2017 which showed right breast asymmetry.  A diagnostic mammogram was suggested and patient did not have it done. Patient felt her right breast mass for a few weeks.  She had bilateral diagnostic mammogram done on 06/30/2019. 2.8 x 2.4 x 2.6 cm right breast mass, 11:00, 10 cm from the nipple.  There is a single mildly abnormal node in the right axilla with a cortex measuring up to 4.4 mm.  No other suspicious findings. Patient underwent ultrasound-guided core biopsy of the right breast mass and right axilla lymph node Pathology showed invasive mammary carcinoma, no special type, grade 3, ER/PR HER-2 status are pending. Right axillary lymph node biopsy showed predominantly blood in the fibroadipose tissue, with scant lymphoid tissue present.  No definite malignancy was identified.  Patient was referred to cancer center to establish care and discuss treatment plan. Menarche 89 Postmenopausal.  LMP when she was 64 years old. She recalls use of birth control pills. Denies any hormone .  Replacement therapy. Denies any prior chest radiation. She reports family history of maternal grandmother and 2 maternal cousins were diagnosed with cancer.  She does not know about details. # 09/03/2019,  patient had an episode of syncope and EMS was called and patient sent to emergency room.  Patient was noted to have a heart rate 45-65 and blood glucose level of 286. Patient denies any history of nausea, vomiting, diarrhea.  Denies any dehydration.  She reported history of intermittent abdominal pain. CT head without contrast showed no acute intracranial abnormality.  CT abdomen pelvis with contrast is negative for evidence of acute abdominal abnormality.  Patient was given IV fluid. Patient was observed with holding her blood pressure medication including amlodipine, bisoprolol.  No arrhythmia was noted on telemetry.  Echocardiogram showed LVEF 60 to 65%.  No valvular abnormalities.  Beta-blocker and diuretics was discontinued.  # Patient was seen by cardiology Dr. Garen Lah for evaluation of syncope and was cleared for chemotherapy. # 10/03/2019, interval unilateral right diagnostic mammogram showed right breast mass 11:00 size has decreased to 3.7 x 1.3 x 2.6 cm.  Previously mass measured 2.8 x 2.4 x 2.6 cm  INTERVAL HISTORY Dawn Alvarez is a 64 y.o. female who has above history reviewed by me today presents for follow up visit for management of breast cancer, evaluation prior to chemotherapy. Problems and complaints are listed below: Patient reports tolerating treatments well. Chemotherapy-induced neuropathy, fingertips and toes, intermittent.  She also has diabetes 2. Gabapentin 100 mg twice daily has been added to her regimen as she feels improvement cough symptoms. She denies being more tired than her baseline.  Appetite is fair.  . Review of Systems  Constitutional: Negative for appetite change, chills, fatigue and fever.  HENT:   Negative for hearing loss and voice change.   Eyes: Negative  for eye problems.  Respiratory: Negative for chest tightness and cough.   Cardiovascular: Negative for chest pain.  Gastrointestinal: Negative for abdominal distention, abdominal pain and blood  in stool.  Endocrine: Negative for hot flashes.  Genitourinary: Negative for difficulty urinating and frequency.   Musculoskeletal: Positive for arthralgias.  Skin: Negative for itching and rash.  Neurological: Positive for numbness. Negative for extremity weakness.  Hematological: Negative for adenopathy.  Psychiatric/Behavioral: Negative for confusion.    MEDICAL HISTORY:  Past Medical History:  Diagnosis Date  . Asthma   . Cancer ([place])    breast  . COPD exacerbation ([place]) 04/12/2016  . Diabetes mellitus without complication ([place])   . Hyperlipemia   . Hypertension   . Personal history of chemotherapy     SURGICAL HISTORY: Past Surgical History:  Procedure Laterality Date  . BREAST BIOPSY Right 07/05/2019   [place] bx venus marker, grade 3 invasive mammary carcinoma  . BREAST BIOPSY Right 07/05/2019   LN bx, hydromarker,PREDOMINANTLY BLOOD AND FIBROADIPOSE TISSUE, WITH SCANT LYMPHOID TISSUE PRESENT      . CESAREAN SECTION    . COLONOSCOPY WITH PROPOFOL N/A 09/11/2015   Procedure: COLONOSCOPY WITH PROPOFOL;  Surgeon: Lucilla Lame, MD;  Location: [facility] ENDOSCOPY;  Service: Endoscopy;  Laterality: N/A;  . IR IMAGING GUIDED PORT INSERTION  07/20/2019    SOCIAL HISTORY: Social History   Socioeconomic History  . Marital status: Married    Spouse name: Not on file  . Number of children: Not on file  . Years of education: Not on file  . Highest education level: Not on file  Occupational History  . Not on file  Tobacco Use  . Smoking status: Former Smoker    Packs/day: 1.00    Years: 2.00    Pack years: 2.00    Types: Cigarettes    Quit date: 05/19/2019    Years since quitting: 0.5  . Smokeless tobacco: Never Used  Substance and Sexual Activity  . Alcohol use: Not Currently  . Drug use: No  . Sexual activity: Never    Birth control/protection: Abstinence  Other Topics Concern  . Not on file  Social History Narrative  . Not on file   Social Determinants of Health    Financial Resource Strain:   . Difficulty of Paying Living Expenses:   Food Insecurity:   . Worried About Charity fundraiser in the Last Year:   . Arboriculturist in the Last Year:   Transportation Needs:   . Film/video editor (Medical):   Marland Kitchen Lack of Transportation (Non-Medical):   Physical Activity:   . Days of Exercise per Week:   . Minutes of Exercise per Session:   Stress:   . Feeling of Stress :   Social Connections:   . Frequency of Communication with Friends and Family:   . Frequency of Social Gatherings with Friends and Family:   . Attends Religious Services:   . Active Member of Clubs or Organizations:   . Attends Archivist Meetings:   Marland Kitchen Marital Status:   Intimate Partner Violence:   . Fear of Current or Ex-Partner:   . Emotionally Abused:   Marland Kitchen Physically Abused:   . Sexually Abused:     FAMILY HISTORY: Family History  Problem Relation Age of Onset  . Diabetes Mother   . Hypertension Mother   . Hypertension Father   . Diabetes Father     ALLERGIES:  has No Known Allergies.  MEDICATIONS:  Current  Outpatient Medications  Medication Sig Dispense Refill  . Alcohol Swabs (B-D SINGLE USE SWABS REGULAR) PADS Use as directed twice a day 300 each 3  . amLODipine (NORVASC) 10 MG tablet Take 10 mg by mouth once.    . Blood Glucose Monitoring Suppl (TRUE METRIX AIR GLUCOSE METER) w/Device KIT 1 Device by Does not apply route in the morning and at bedtime. Use ad directed e11.65 1 kit 0  . brimonidine (ALPHAGAN) 0.2 % ophthalmic solution Place 1 drop into both eyes 2 (two) times daily.    Marland Kitchen gabapentin (NEURONTIN) 100 MG capsule Take 1 capsule (100 mg total) by mouth 2 (two) times daily. 60 capsule 0  . glucose blood (TRUE METRIX BLOOD GLUCOSE TEST) test strip Use as instructed twice a daily diag e11.65 100 each 3  . insulin detemir (LEVEMIR FLEXTOUCH) 100 UNIT/ML FlexPen Inject 44 Units into the skin daily. 15 mL 3  . Insulin Pen Needle (PEN NEEDLES) 31G  X 8 MM MISC Use as directed with insulin E11.65 100 each 1  . Lancets Misc. (ACCU-CHEK MULTICLIX LANCET DEV) KIT by Does not apply route. Use as directed twice a day diag E11.65    . lidocaine-prilocaine (EMLA) cream     . nitrofurantoin, macrocrystal-monohydrate, (MACROBID) 100 MG capsule Take 1 capsule (100 mg total) by mouth 2 (two) times daily. 14 capsule 0  . Omega-3 Fatty Acids (FISH OIL) 1000 MG CAPS Take 1,000 mg by mouth daily.     . potassium chloride (KLOR-CON) 10 MEQ tablet Take 1 tablet (10 mEq total) by mouth daily. 30 tablet 0  . prochlorperazine (COMPAZINE) 10 MG tablet Take 1 tablet (10 mg total) by mouth every 6 (six) hours as needed for nausea or vomiting. 60 tablet 1  . Semaglutide, 1 MG/DOSE, (OZEMPIC, 1 MG/DOSE,) 2 MG/1.5ML SOPN Inject 1 mg into the skin once a week. 5 pen 3  . SIMBRINZA 1-0.2 % SUSP     . TRUEplus Lancets 30G MISC Use as directed twice daily diag e11.65 100 each 3   No current facility-administered medications for this visit.   Facility-Administered Medications Ordered in Other Visits  Medication Dose Route Frequency Provider Last Rate Last Admin  . sodium chloride flush (NS) 0.9 % injection 10 mL  10 mL Intravenous PRN Earlie Server, MD   10 mL at 10/11/19 0817  . sodium chloride flush (NS) 0.9 % injection 10 mL  10 mL Intravenous PRN Earlie Server, MD   10 mL at 12/09/19 0840     PHYSICAL EXAMINATION: ECOG PERFORMANCE STATUS: 1 - Symptomatic but completely ambulatory Vitals:   12/09/19 0857  BP: 122/77  Pulse: 81  Resp: 18  Temp: (!) 96.3 F (35.7 C)   Filed Weights   12/09/19 0857  Weight: 164 lb 14.4 oz (74.8 kg)    Physical Exam Constitutional:      General: She is not in acute distress. HENT:     Head: Normocephalic and atraumatic.  Eyes:     General: No scleral icterus. Cardiovascular:     Rate and Rhythm: Normal rate and regular rhythm.     Heart sounds: Normal heart sounds.  Pulmonary:     Effort: Pulmonary effort is normal. No  respiratory distress.     Breath sounds: Normal breath sounds. No wheezing.  Abdominal:     General: Bowel sounds are normal. There is no distension.     Palpations: Abdomen is soft.  Musculoskeletal:        General: No deformity.  Normal range of motion.     Cervical back: Normal range of motion and neck supple.  Skin:    General: Skin is warm and dry.     Findings: No erythema or rash.  Neurological:     Mental Status: She is alert and oriented to person, place, and time. Mental status is at baseline.     Cranial Nerves: No cranial nerve deficit.     Coordination: Coordination normal.  Psychiatric:        Mood and Affect: Mood normal.      LABORATORY DATA:  I have reviewed the data as listed Lab Results  Component Value Date   WBC 5.8 12/09/2019   HGB 8.7 (L) 12/09/2019   HCT 25.6 (L) 12/09/2019   MCV 91.4 12/09/2019   PLT 298 12/09/2019   Recent Labs    11/17/19 0824 11/24/19 0854 12/02/19 0829  NA 137 136 138  K 4.1 3.5 3.8  CL 103 103 106  CO2 _0 GLUCOSE 80 106* 104*  BUN _1 CREATININE 0.72 0.86 0.86  CALCIUM 9.1 9.1 8.8*  GFRNONAA >60 >60 >60  GFRAA >60 >60 >60  PROT 7.9 7.5 7.0  ALBUMIN 3.7 3.7 3.7  AST _2 ALT _3 ALKPHOS 32* 31* 27*  BILITOT 0.6 0.5 0.4   Iron/TIBC/Ferritin/ %Sat No results found for: IRON, TIBC, FERRITIN, IRONPCTSAT    RADIOGRAPHIC STUDIES: I have personally reviewed the radiological images as listed and agreed with the findings in the report. US Breast Limited Uni Right Inc Axilla  Result Date: 10/03/2019 CLINICAL DATA:  Ultrasound-guided core biopsy of mass in the RIGHT breast 10-11 o'clock location performed 07/05/2019, showing grade 3 invasive mammary carcinoma. Ultrasound-guided core biopsy of RIGHT axillary lymph node was benign and concordant. Patient has undergone neoadjuvant treatment scans feels the mass has gotten smaller. Assess for response to treatment. EXAM: DIGITAL DIAGNOSTIC RIGHT MAMMOGRAM  WITH CAD AND TOMO ULTRASOUND RIGHT BREAST COMPARISON:  07/05/2019 and earlier ACR Breast Density Category b: There are scattered areas of fibroglandular density. FINDINGS: Within the UPPER-OUTER QUADRANT of the RIGHT breast there is a spiculated mass, marked with a tissue marking clip. Lesion appears smaller compared to prior study. No new suspicious findings in the RIGHT breast. Mammographic images were processed with CAD. Targeted ultrasound is performed, showingirregular hypoechoic mass in the 11 o'clock location of the RIGHT breast 10 centimeters from the nipple, measuring 2.7 x 1.3 x 2.6 centimeters. Previously mass measured 2.8 x 2.4 x 2.6 centimeters. IMPRESSION: Smaller mass in the 11 o'clock location of the RIGHT breast. RECOMMENDATION: Treatment plan for known RIGHT breast cancer. I have discussed the findings and recommendations with the patient. If applicable, a reminder letter will be sent to the patient regarding the next appointment. BI-RADS CATEGORY  6: Known biopsy-proven malignancy. Electronically Signed   By: Nolon Nations M.D.   On: 10/03/2019 14:36   MM DIAG BREAST TOMO UNI RIGHT  Result Date: 10/03/2019 CLINICAL DATA:  Ultrasound-guided core biopsy of mass in the RIGHT breast 10-11 o'clock location performed 07/05/2019, showing grade 3 invasive mammary carcinoma. Ultrasound-guided core biopsy of RIGHT axillary lymph node was benign and concordant. Patient has undergone neoadjuvant treatment scans feels the mass has gotten smaller. Assess for response to treatment. EXAM: DIGITAL DIAGNOSTIC RIGHT MAMMOGRAM WITH CAD AND TOMO ULTRASOUND RIGHT BREAST COMPARISON:  07/05/2019 and earlier ACR Breast Density Category b: There are scattered areas of fibroglandular density. FINDINGS: Within the UPPER-OUTER  QUADRANT of the RIGHT breast there is a spiculated mass, marked with a tissue marking clip. Lesion appears smaller compared to prior study. No new suspicious findings in the RIGHT breast.  Mammographic images were processed with CAD. Targeted ultrasound is performed, showingirregular hypoechoic mass in the 11 o'clock location of the RIGHT breast 10 centimeters from the nipple, measuring 2.7 x 1.3 x 2.6 centimeters. Previously mass measured 2.8 x 2.4 x 2.6 centimeters. IMPRESSION: Smaller mass in the 11 o'clock location of the RIGHT breast. RECOMMENDATION: Treatment plan for known RIGHT breast cancer. I have discussed the findings and recommendations with the patient. If applicable, a reminder letter will be sent to the patient regarding the next appointment. BI-RADS CATEGORY  6: Known biopsy-proven malignancy. Electronically Signed   By: Nolon Nations M.D.   On: 10/03/2019 14:36   DG HIP UNILAT WITH PELVIS MIN 4 VIEWS RIGHT  Result Date: 11/17/2019 CLINICAL DATA:  64 year old female with right hip pain. EXAM: DG HIP (WITH OR WITHOUT PELVIS) 4+V RIGHT COMPARISON:  CT of the abdomen pelvis dated 09/03/2019. FINDINGS: There is no acute fracture or dislocation. The bones are mildly osteopenic. There is mild bilateral hip osteoarthritic changes. There is degenerative changes of the lower lumbar spine primarily at L4-L5 with endplate irregularity, disc degeneration and osteophyte. Calcified uterine fibroid noted over the pelvis. The soft tissues are unremarkable. Vascular calcifications noted. IMPRESSION: 1. No acute fracture or dislocation. 2. Mild bilateral hip osteoarthritic changes. Electronically Signed   By: Anner Crete M.D.   On: 11/17/2019 21:47   LONG TERM MONITOR (3-14 DAYS)  Result Date: 10/14/2019 Patient had a min HR of 58 bpm, max HR of 176 bpm, and avg HR of 88 bpm. Predominant underlying rhythm was Sinus Rhythm.  Occasional atrial tachycardia runs noted. . Isolated SVEs were rare (<1.0%).  No significant arrhythmias noted. Overall benign cardiac monitor.     ASSESSMENT & PLAN:  1. Encounter for antineoplastic chemotherapy   2. Antineoplastic chemotherapy induced anemia    3. Malignant neoplasm of upper-outer quadrant of right breast in female, estrogen receptor negative ([place])   4. Neuropathy   5. Hypokalemia    cT2N0 grade 3 invasive mammary carcinoma.  ER negative, PR weakly positive (<=10%), HER-2 negative Status post 4 cycles of ddAC with growth factor support. Patient is currently on weekly Taxol.  Overall she tolerates well with mild to moderate difficulties. Labs reviewed and discussed with patient.  Okay to proceed with Taxol treatment today.  Taxol has been dose reduced to 70 mg per metered squared given her neuropathy.   #Chemotherapy-induced neuropathy, grade 1, in a context of diabetes continue gabapentin 100 mg twice daily #Chemotherapy-induced anemia.  Hemoglobin has gradually trended down to 8.7.  She is asymptomatic.  Continue to monitor. 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  She will also receive IV potassium chloride 20 mEq today along with her chemotherapy.  All questions were answered. The patient knows to call the clinic with any problems questions or concerns. Return of visit: 1 week.  Earlie Server, MD, PhD Hematology Oncology [facility] Pager- [phone] 12/09/2019

## 2019-12-09 NOTE — Research (Signed)
Patient Dawn Alvarez presents to clinic with her husband this morning for consideration of Cycle 9 Taxol chemotherapy and week 8 assessment for the [facility] S1714 research study. Patient reports she is well today, but does state she is experiencing some numbness and tingling in her fingers down to the first joint and in her toes. She denies feeling the numbness/tingling in her hands at feet at present. Solicited Neuropathy symptoms evaluated and patient denies dysesthesia, neuralgia, paresthesia or any motor neuropathy symptoms. Dr. Tasia Catchings in to perform H&P and states that she started the patient on Gabapentin 2 week ago for her neuropathy symptoms and Ms. Manalang reports the symptoms have improved a lot with the use of Gabapentin. Patient states they neuropathy begins after she receives her Taxol infusion, and occurs a few times daily for 2-3 days before subsiding, and reports the episodes last about 15-20 minutes before they go away. She denies using any acupuncture, cold or compression gloves or socks, massage therapy or any other medication thatn the gabapentin for her neuropathy symptoms. After verifying that the patient is left-side dominant, the Neuropen and [place] assessments were completed by this RN with assistance of Jeral Fruit, Therapist, sports. Patient also completed the PROMIS-29, GSLTPAQ, EORTC QLQ-CIPN20, PRO-CTCAE, and the week 8 Patient Reported Symptom Burden Questionnaire while in exam room and prior to her Taxol infusion. At the patient's preference, and due to her report that she does not see well to read small print, all study-related questionnaires were read out loud and patient responses were recorded by this RN. Patient denies having any glasses for reading, and also denies that she has difficulty being able to see adequately to perform other tasks. Dr. Tasia Catchings also completed the Physician's Follow up Treatment Burden question and reviewed the week 8 CTCAE (neuropathy) Assessment, assigning a Grade 1 and  definitely related attribution to the patient's solicited neuropathy symptoms. Solicited and other AEs with grade and attribution are as noted below:   Adverse Event Log  Study/Protocol: SWOG S3159 Cycle: Week 8 solicited AEs Event Grade Onset Date Resolved Date Drug Name Attribution Treatment Comments  Dysesthesia 0        Neuralgia 0        Paresthesia 0        Peripheral Motor neuropathy 0        Peripheral Sensory neuropathy 1 11/24/2019  Taxol Definite gabapentin            Yolande Jolly, BSN, MHA, OCN 12/09/2019 10:06 AM

## 2019-12-09 NOTE — Progress Notes (Signed)
Patient denies new problems/concerns today.   °

## 2019-12-12 LAB — CULTURE, URINE COMPREHENSIVE

## 2019-12-16 ENCOUNTER — Inpatient Hospital Stay: Payer: Medicare HMO

## 2019-12-16 ENCOUNTER — Telehealth: Payer: Self-pay

## 2019-12-16 ENCOUNTER — Other Ambulatory Visit: Payer: Self-pay

## 2019-12-16 ENCOUNTER — Inpatient Hospital Stay (HOSPITAL_BASED_OUTPATIENT_CLINIC_OR_DEPARTMENT_OTHER): Payer: Medicare HMO | Admitting: Oncology

## 2019-12-16 ENCOUNTER — Encounter: Payer: Self-pay | Admitting: Oncology

## 2019-12-16 VITALS — BP 158/80 | HR 93 | Resp 16

## 2019-12-16 VITALS — BP 161/90 | HR 81 | Temp 96.2°F | Resp 18 | Wt 163.7 lb

## 2019-12-16 DIAGNOSIS — C50411 Malignant neoplasm of upper-outer quadrant of right female breast: Secondary | ICD-10-CM | POA: Diagnosis not present

## 2019-12-16 DIAGNOSIS — T451X5A Adverse effect of antineoplastic and immunosuppressive drugs, initial encounter: Secondary | ICD-10-CM | POA: Diagnosis not present

## 2019-12-16 DIAGNOSIS — D6481 Anemia due to antineoplastic chemotherapy: Secondary | ICD-10-CM

## 2019-12-16 DIAGNOSIS — Z5111 Encounter for antineoplastic chemotherapy: Secondary | ICD-10-CM

## 2019-12-16 DIAGNOSIS — Z95828 Presence of other vascular implants and grafts: Secondary | ICD-10-CM

## 2019-12-16 DIAGNOSIS — Z171 Estrogen receptor negative status [ER-]: Secondary | ICD-10-CM | POA: Diagnosis not present

## 2019-12-16 DIAGNOSIS — G629 Polyneuropathy, unspecified: Secondary | ICD-10-CM

## 2019-12-16 DIAGNOSIS — E876 Hypokalemia: Secondary | ICD-10-CM | POA: Diagnosis not present

## 2019-12-16 DIAGNOSIS — G620 Drug-induced polyneuropathy: Secondary | ICD-10-CM | POA: Diagnosis not present

## 2019-12-16 LAB — COMPREHENSIVE METABOLIC PANEL
ALT: 17 U/L (ref 0–44)
AST: 20 U/L (ref 15–41)
Albumin: 3.7 g/dL (ref 3.5–5.0)
Alkaline Phosphatase: 33 U/L — ABNORMAL LOW (ref 38–126)
Anion gap: 7 (ref 5–15)
BUN: 15 mg/dL (ref 8–23)
CO2: 26 mmol/L (ref 22–32)
Calcium: 9.2 mg/dL (ref 8.9–10.3)
Chloride: 103 mmol/L (ref 98–111)
Creatinine, Ser: 0.82 mg/dL (ref 0.44–1.00)
GFR calc Af Amer: >60 mL/min (ref >60)
GFR calc non Af Amer: >60 mL/min (ref >60)
Glucose, Bld: 154 mg/dL — ABNORMAL HIGH (ref 70–99)
Potassium: 3.9 mmol/L (ref 3.5–5.1)
Sodium: 136 mmol/L (ref 135–145)
Total Bilirubin: 0.4 mg/dL (ref 0.3–1.2)
Total Protein: 6.8 g/dL (ref 6.5–8.1)

## 2019-12-16 LAB — CBC WITH DIFFERENTIAL/PLATELET
Abs Immature Granulocytes: 0.04 10*3/uL (ref 0.00–0.07)
Basophils Absolute: 0.1 10*3/uL (ref 0.0–0.1)
Basophils Relative: 1 %
Eosinophils Absolute: 0.2 10*3/uL (ref 0.0–0.5)
Eosinophils Relative: 3 %
HCT: 27.7 % — ABNORMAL LOW (ref 36.0–46.0)
Hemoglobin: 9.3 g/dL — ABNORMAL LOW (ref 12.0–15.0)
Immature Granulocytes: 1 %
Lymphocytes Relative: 24 %
Lymphs Abs: 1.4 10*3/uL (ref 0.7–4.0)
MCH: 30.8 pg (ref 26.0–34.0)
MCHC: 33.6 g/dL (ref 30.0–36.0)
MCV: 91.7 fL (ref 80.0–100.0)
Monocytes Absolute: 0.5 10*3/uL (ref 0.1–1.0)
Monocytes Relative: 9 %
Neutro Abs: 3.6 10*3/uL (ref 1.7–7.7)
Neutrophils Relative %: 62 %
Platelets: 292 10*3/uL (ref 150–400)
RBC: 3.02 MIL/uL — ABNORMAL LOW (ref 3.87–5.11)
RDW: 15.8 % — ABNORMAL HIGH (ref 11.5–15.5)
WBC: 5.7 10*3/uL (ref 4.0–10.5)
nRBC: 0 % (ref 0.0–0.2)

## 2019-12-16 LAB — MAGNESIUM: Magnesium: 1.8 mg/dL (ref 1.7–2.4)

## 2019-12-16 MED ORDER — SODIUM CHLORIDE 0.9 % IV SOLN
70.0000 mg/m2 | Freq: Once | INTRAVENOUS | Status: AC
  Administered 2019-12-16: 126 mg via INTRAVENOUS
  Filled 2019-12-16: qty 21

## 2019-12-16 MED ORDER — DIPHENHYDRAMINE HCL 50 MG/ML IJ SOLN
50.0000 mg | Freq: Once | INTRAMUSCULAR | Status: AC
  Administered 2019-12-16: 50 mg via INTRAVENOUS
  Filled 2019-12-16: qty 1

## 2019-12-16 MED ORDER — FAMOTIDINE IN NACL 20-0.9 MG/50ML-% IV SOLN
20.0000 mg | Freq: Once | INTRAVENOUS | Status: AC
  Administered 2019-12-16: 20 mg via INTRAVENOUS
  Filled 2019-12-16: qty 50

## 2019-12-16 MED ORDER — SODIUM CHLORIDE 0.9 % IV SOLN
10.0000 mg | Freq: Once | INTRAVENOUS | Status: AC
  Administered 2019-12-16: 10 mg via INTRAVENOUS
  Filled 2019-12-16: qty 10

## 2019-12-16 MED ORDER — HEPARIN SOD (PORK) LOCK FLUSH 100 UNIT/ML IV SOLN
INTRAVENOUS | Status: AC
  Filled 2019-12-16: qty 5

## 2019-12-16 MED ORDER — SODIUM CHLORIDE 0.9 % IV SOLN
Freq: Once | INTRAVENOUS | Status: AC
  Filled 2019-12-16: qty 250

## 2019-12-16 MED ORDER — HEPARIN SOD (PORK) LOCK FLUSH 100 UNIT/ML IV SOLN
500.0000 [IU] | Freq: Once | INTRAVENOUS | Status: AC | PRN
  Administered 2019-12-16: 500 [IU]
  Filled 2019-12-16: qty 5

## 2019-12-16 MED ORDER — SODIUM CHLORIDE 0.9% FLUSH
10.0000 mL | INTRAVENOUS | Status: DC | PRN
  Administered 2019-12-16: 10 mL via INTRAVENOUS
  Filled 2019-12-16: qty 10

## 2019-12-16 NOTE — Telephone Encounter (Signed)
error 

## 2019-12-16 NOTE — Progress Notes (Signed)
Patient denies any concerns today.  

## 2019-12-16 NOTE — Progress Notes (Signed)
Hematology/Oncology follow up note [facility] [phone] [phone]   Patient Care Team: Lavera Guise, MD as PCP - General (Internal Medicine) Kate Sable, MD as PCP - Cardiology (Cardiology) Rico Junker, RN as Registered Nurse Theodore Demark, RN as Registered Nurse Earlie Server, MD as Consulting Physician (Oncology)  REFERRING PROVIDER: Lavera Guise, MD  CHIEF COMPLAINTS/REASON FOR VISIT:  Follow-up for breast cancer  HISTORY OF PRESENTING ILLNESS:   Dawn Alvarez is a  64 y.o.  female with PMH listed below was seen in consultation at the request of  Lavera Guise, MD  for evaluation of breast cancer Patient had screening mammogram done 08/12/2017 which showed right breast asymmetry.  A diagnostic mammogram was suggested and patient did not have it done. Patient felt her right breast mass for a few weeks.  She had bilateral diagnostic mammogram done on 06/30/2019. 2.8 x 2.4 x 2.6 cm right breast mass, 11:00, 10 cm from the nipple.  There is a single mildly abnormal node in the right axilla with a cortex measuring up to 4.4 mm.  No other suspicious findings. Patient underwent ultrasound-guided core biopsy of the right breast mass and right axilla lymph node Pathology showed invasive mammary carcinoma, no special type, grade 3, ER/PR HER-2 status are pending. Right axillary lymph node biopsy showed predominantly blood in the fibroadipose tissue, with scant lymphoid tissue present.  No definite malignancy was identified.  Patient was referred to cancer center to establish care and discuss treatment plan. Menarche 19 Postmenopausal.  LMP when she was 64 years old. She recalls use of birth control pills. Denies any hormone .  Replacement therapy. Denies any prior chest radiation. She reports family history of maternal grandmother and 2 maternal cousins were diagnosed with cancer.  She does not know about details. # 09/03/2019,  patient had an episode of syncope and EMS was called and patient sent to emergency room.  Patient was noted to have a heart rate 45-65 and blood glucose level of 286. Patient denies any history of nausea, vomiting, diarrhea.  Denies any dehydration.  She reported history of intermittent abdominal pain. CT head without contrast showed no acute intracranial abnormality.  CT abdomen pelvis with contrast is negative for evidence of acute abdominal abnormality.  Patient was given IV fluid. Patient was observed with holding her blood pressure medication including amlodipine, bisoprolol.  No arrhythmia was noted on telemetry.  Echocardiogram showed LVEF 60 to 65%.  No valvular abnormalities.  Beta-blocker and diuretics was discontinued.  # Patient was seen by cardiology Dr. Garen Lah for evaluation of syncope and was cleared for chemotherapy. # 10/03/2019, interval unilateral right diagnostic mammogram showed right breast mass 11:00 size has decreased to 3.7 x 1.3 x 2.6 cm.  Previously mass measured 2.8 x 2.4 x 2.6 cm  INTERVAL HISTORY Dawn Alvarez is a 64 y.o. female who has above history reviewed by me today presents for follow up visit for management of breast cancer, evaluation prior to chemotherapy. Problems and complaints are listed below: Patient reports tolerating chemotherapy well. Gabapentin is helping her neuropathy. Chronic fatigue is at baseline Appetite is fair.   . Review of Systems  Constitutional: Negative for appetite change, chills, fatigue and fever.  HENT:   Negative for hearing loss and voice change.   Eyes: Negative for eye problems.  Respiratory: Negative for chest tightness and cough.   Cardiovascular: Negative for chest pain.  Gastrointestinal: Negative for abdominal distention, abdominal pain and  blood in stool.  Endocrine: Negative for hot flashes.  Genitourinary: Negative for difficulty urinating and frequency.   Musculoskeletal: Positive for arthralgias.  Skin:  Negative for itching and rash.  Neurological: Positive for numbness. Negative for extremity weakness.  Hematological: Negative for adenopathy.  Psychiatric/Behavioral: Negative for confusion.    MEDICAL HISTORY:  Past Medical History:  Diagnosis Date  . Asthma   . Cancer ([place])    breast  . COPD exacerbation ([place]) 04/12/2016  . Diabetes mellitus without complication ([place])   . Hyperlipemia   . Hypertension   . Personal history of chemotherapy     SURGICAL HISTORY: Past Surgical History:  Procedure Laterality Date  . BREAST BIOPSY Right 07/05/2019   [place] bx venus marker, grade 3 invasive mammary carcinoma  . BREAST BIOPSY Right 07/05/2019   LN bx, hydromarker,PREDOMINANTLY BLOOD AND FIBROADIPOSE TISSUE, WITH SCANT LYMPHOID TISSUE PRESENT      . CESAREAN SECTION    . COLONOSCOPY WITH PROPOFOL N/A 09/11/2015   Procedure: COLONOSCOPY WITH PROPOFOL;  Surgeon: Lucilla Lame, MD;  Location: [facility] ENDOSCOPY;  Service: Endoscopy;  Laterality: N/A;  . IR IMAGING GUIDED PORT INSERTION  07/20/2019    SOCIAL HISTORY: Social History   Socioeconomic History  . Marital status: Married    Spouse name: Not on file  . Number of children: Not on file  . Years of education: Not on file  . Highest education level: Not on file  Occupational History  . Not on file  Tobacco Use  . Smoking status: Former Smoker    Packs/day: 1.00    Years: 2.00    Pack years: 2.00    Types: Cigarettes    Quit date: 05/19/2019    Years since quitting: 0.5  . Smokeless tobacco: Never Used  Substance and Sexual Activity  . Alcohol use: Not Currently  . Drug use: No  . Sexual activity: Never    Birth control/protection: Abstinence  Other Topics Concern  . Not on file  Social History Narrative  . Not on file   Social Determinants of Health   Financial Resource Strain:   . Difficulty of Paying Living Expenses:   Food Insecurity:   . Worried About Charity fundraiser in the Last Year:   . Arboriculturist in  the Last Year:   Transportation Needs:   . Film/video editor (Medical):   Marland Kitchen Lack of Transportation (Non-Medical):   Physical Activity:   . Days of Exercise per Week:   . Minutes of Exercise per Session:   Stress:   . Feeling of Stress :   Social Connections:   . Frequency of Communication with Friends and Family:   . Frequency of Social Gatherings with Friends and Family:   . Attends Religious Services:   . Active Member of Clubs or Organizations:   . Attends Archivist Meetings:   Marland Kitchen Marital Status:   Intimate Partner Violence:   . Fear of Current or Ex-Partner:   . Emotionally Abused:   Marland Kitchen Physically Abused:   . Sexually Abused:     FAMILY HISTORY: Family History  Problem Relation Age of Onset  . Diabetes Mother   . Hypertension Mother   . Hypertension Father   . Diabetes Father     ALLERGIES:  has No Known Allergies.  MEDICATIONS:  Current Outpatient Medications  Medication Sig Dispense Refill  . Alcohol Swabs (B-D SINGLE USE SWABS REGULAR) PADS Use as directed twice a day 300 each 3  .  amLODipine (NORVASC) 10 MG tablet Take 5 mg by mouth once.     . Blood Glucose Monitoring Suppl (TRUE METRIX AIR GLUCOSE METER) w/Device KIT 1 Device by Does not apply route in the morning and at bedtime. Use ad directed e11.65 1 kit 0  . brimonidine (ALPHAGAN) 0.2 % ophthalmic solution Place 1 drop into both eyes 2 (two) times daily.    . Calcium Carb-Cholecalciferol (CALCIUM 600 + D) 600-200 MG-UNIT TABS Take 1 tablet by mouth daily.    Marland Kitchen gabapentin (NEURONTIN) 100 MG capsule Take 1 capsule (100 mg total) by mouth 2 (two) times daily. 60 capsule 0  . glucose blood (TRUE METRIX BLOOD GLUCOSE TEST) test strip Use as instructed twice a daily diag e11.65 100 each 3  . insulin detemir (LEVEMIR FLEXTOUCH) 100 UNIT/ML FlexPen Inject 44 Units into the skin daily. 15 mL 3  . Insulin Pen Needle (PEN NEEDLES) 31G X 8 MM MISC Use as directed with insulin E11.65 100 each 1  . Lancets  Misc. (ACCU-CHEK MULTICLIX LANCET DEV) KIT by Does not apply route. Use as directed twice a day diag E11.65    . lidocaine-prilocaine (EMLA) cream     . lisinopril (ZESTRIL) 20 MG tablet     . nitrofurantoin, macrocrystal-monohydrate, (MACROBID) 100 MG capsule Take 1 capsule (100 mg total) by mouth 2 (two) times daily. 14 capsule 0  . Omega-3 Fatty Acids (FISH OIL) 1000 MG CAPS Take 1,000 mg by mouth daily.     . potassium chloride (KLOR-CON) 10 MEQ tablet Take 1 tablet (10 mEq total) by mouth daily. 30 tablet 0  . prochlorperazine (COMPAZINE) 10 MG tablet Take 1 tablet (10 mg total) by mouth every 6 (six) hours as needed for nausea or vomiting. 60 tablet 1  . Semaglutide, 1 MG/DOSE, (OZEMPIC, 1 MG/DOSE,) 2 MG/1.5ML SOPN Inject 1 mg into the skin once a week. 5 pen 3  . SIMBRINZA 1-0.2 % SUSP     . TRUEplus Lancets 30G MISC Use as directed twice daily diag e11.65 100 each 3   No current facility-administered medications for this visit.   Facility-Administered Medications Ordered in Other Visits  Medication Dose Route Frequency Provider Last Rate Last Admin  . famotidine (PEPCID) IVPB 20 mg premix  20 mg Intravenous Once Earlie Server, MD      . heparin lock flush 100 unit/mL  500 Units Intracatheter Once PRN Earlie Server, MD      . PACLitaxel (TAXOL) 126 mg in sodium chloride 0.9 % 250 mL chemo infusion (</= 77m/m2)  70 mg/m2 (Order-Specific) Intravenous Once YEarlie Server MD      . sodium chloride flush (NS) 0.9 % injection 10 mL  10 mL Intravenous PRN YEarlie Server MD   10 mL at 10/11/19 0817  . sodium chloride flush (NS) 0.9 % injection 10 mL  10 mL Intravenous PRN YEarlie Server MD   10 mL at 12/16/19 0845     PHYSICAL EXAMINATION: ECOG PERFORMANCE STATUS: 1 - Symptomatic but completely ambulatory Vitals:   12/16/19 0857  BP: (!) 161/90  Pulse: 81  Resp: 18  Temp: (!) 96.2 F (35.7 C)   Filed Weights   12/16/19 0857  Weight: 163 lb 11.2 oz (74.3 kg)    Physical Exam Constitutional:       General: She is not in acute distress. HENT:     Head: Normocephalic and atraumatic.  Eyes:     General: No scleral icterus. Cardiovascular:     Rate and Rhythm: Normal  rate and regular rhythm.     Heart sounds: Normal heart sounds.  Pulmonary:     Effort: Pulmonary effort is normal. No respiratory distress.     Breath sounds: Normal breath sounds. No wheezing.  Abdominal:     General: Bowel sounds are normal. There is no distension.     Palpations: Abdomen is soft.  Musculoskeletal:        General: No deformity. Normal range of motion.     Cervical back: Normal range of motion and neck supple.  Skin:    General: Skin is warm and dry.     Findings: No erythema or rash.  Neurological:     Mental Status: She is alert and oriented to person, place, and time. Mental status is at baseline.     Cranial Nerves: No cranial nerve deficit.     Coordination: Coordination normal.  Psychiatric:        Mood and Affect: Mood normal.      LABORATORY DATA:  I have reviewed the data as listed Lab Results  Component Value Date   WBC 5.7 12/16/2019   HGB 9.3 (L) 12/16/2019   HCT 27.7 (L) 12/16/2019   MCV 91.7 12/16/2019   PLT 292 12/16/2019   Recent Labs    12/02/19 0829 12/09/19 0837 12/16/19 0841  NA 138 137 136  K 3.8 3.3* 3.9  CL 106 102 103  CO2 '23 26 26  ' GLUCOSE 104* 160* 154*  BUN '11 11 15  ' CREATININE 0.86 0.80 0.82  CALCIUM 8.8* 8.8* 9.2  GFRNONAA >60 >60 >60  GFRAA >60 >60 >60  PROT 7.0 6.5 6.8  ALBUMIN 3.7 3.4* 3.7  AST '18 17 20  ' ALT '15 13 17  ' ALKPHOS 27* 29* 33*  BILITOT 0.4 0.3 0.4   Iron/TIBC/Ferritin/ %Sat No results found for: IRON, TIBC, FERRITIN, IRONPCTSAT    RADIOGRAPHIC STUDIES: I have personally reviewed the radiological images as listed and agreed with the findings in the report. US Breast Limited Uni Right Inc Axilla  Result Date: 10/03/2019 CLINICAL DATA:  Ultrasound-guided core biopsy of mass in the RIGHT breast 10-11 o'clock location  performed 07/05/2019, showing grade 3 invasive mammary carcinoma. Ultrasound-guided core biopsy of RIGHT axillary lymph node was benign and concordant. Patient has undergone neoadjuvant treatment scans feels the mass has gotten smaller. Assess for response to treatment. EXAM: DIGITAL DIAGNOSTIC RIGHT MAMMOGRAM WITH CAD AND TOMO ULTRASOUND RIGHT BREAST COMPARISON:  07/05/2019 and earlier ACR Breast Density Category b: There are scattered areas of fibroglandular density. FINDINGS: Within the UPPER-OUTER QUADRANT of the RIGHT breast there is a spiculated mass, marked with a tissue marking clip. Lesion appears smaller compared to prior study. No new suspicious findings in the RIGHT breast. Mammographic images were processed with CAD. Targeted ultrasound is performed, showingirregular hypoechoic mass in the 11 o'clock location of the RIGHT breast 10 centimeters from the nipple, measuring 2.7 x 1.3 x 2.6 centimeters. Previously mass measured 2.8 x 2.4 x 2.6 centimeters. IMPRESSION: Smaller mass in the 11 o'clock location of the RIGHT breast. RECOMMENDATION: Treatment plan for known RIGHT breast cancer. I have discussed the findings and recommendations with the patient. If applicable, a reminder letter will be sent to the patient regarding the next appointment. BI-RADS CATEGORY  6: Known biopsy-proven malignancy. Electronically Signed   By: Nolon Nations M.D.   On: 10/03/2019 14:36   MM DIAG BREAST TOMO UNI RIGHT  Result Date: 10/03/2019 CLINICAL DATA:  Ultrasound-guided core biopsy of mass in the RIGHT breast  10-11 o'clock location performed 07/05/2019, showing grade 3 invasive mammary carcinoma. Ultrasound-guided core biopsy of RIGHT axillary lymph node was benign and concordant. Patient has undergone neoadjuvant treatment scans feels the mass has gotten smaller. Assess for response to treatment. EXAM: DIGITAL DIAGNOSTIC RIGHT MAMMOGRAM WITH CAD AND TOMO ULTRASOUND RIGHT BREAST COMPARISON:  07/05/2019 and earlier  ACR Breast Density Category b: There are scattered areas of fibroglandular density. FINDINGS: Within the UPPER-OUTER QUADRANT of the RIGHT breast there is a spiculated mass, marked with a tissue marking clip. Lesion appears smaller compared to prior study. No new suspicious findings in the RIGHT breast. Mammographic images were processed with CAD. Targeted ultrasound is performed, showingirregular hypoechoic mass in the 11 o'clock location of the RIGHT breast 10 centimeters from the nipple, measuring 2.7 x 1.3 x 2.6 centimeters. Previously mass measured 2.8 x 2.4 x 2.6 centimeters. IMPRESSION: Smaller mass in the 11 o'clock location of the RIGHT breast. RECOMMENDATION: Treatment plan for known RIGHT breast cancer. I have discussed the findings and recommendations with the patient. If applicable, a reminder letter will be sent to the patient regarding the next appointment. BI-RADS CATEGORY  6: Known biopsy-proven malignancy. Electronically Signed   By: Nolon Nations M.D.   On: 10/03/2019 14:36   DG HIP UNILAT WITH PELVIS MIN 4 VIEWS RIGHT  Result Date: 11/17/2019 CLINICAL DATA:  64 year old female with right hip pain. EXAM: DG HIP (WITH OR WITHOUT PELVIS) 4+V RIGHT COMPARISON:  CT of the abdomen pelvis dated 09/03/2019. FINDINGS: There is no acute fracture or dislocation. The bones are mildly osteopenic. There is mild bilateral hip osteoarthritic changes. There is degenerative changes of the lower lumbar spine primarily at L4-L5 with endplate irregularity, disc degeneration and osteophyte. Calcified uterine fibroid noted over the pelvis. The soft tissues are unremarkable. Vascular calcifications noted. IMPRESSION: 1. No acute fracture or dislocation. 2. Mild bilateral hip osteoarthritic changes. Electronically Signed   By: Anner Crete M.D.   On: 11/17/2019 21:47      ASSESSMENT & PLAN:  1. Malignant neoplasm of upper-outer quadrant of right female breast, unspecified estrogen receptor status ([place])     2. Encounter for antineoplastic chemotherapy   3. Antineoplastic chemotherapy induced anemia   4. Neuropathy    cT2N0 grade 3 invasive mammary carcinoma.  ER negative, PR weakly positive (<=10%), HER-2 negative Status post 4 cycles of ddAC with growth factor support. Patient is currently on weekly Taxol.  Overall she tolerates with mild to moderate difficulties Labs reviewed and discussed with patient. Counts acceptable to proceed with Taxol treatments today.  #Chemotherapy-induced neuropathy, grade 1, in the context of diabetes, I have decreased Taxol to 70 mg/m.  Continue gabapentin 100 mg twice daily.  Symptoms are stable.  #Anemia, secondary to chemotherapy.  Hemoglobin is 9.3 today.  Stable.  Continue to monitor. Chronic hypokalemia, potassium level has improved.  Continue oral potassium chloride 10 mEq daily  All questions were answered. The patient knows to call the clinic with any problems questions or concerns. Return of visit: 1 week.  Earlie Server, MD, PhD Hematology Oncology [facility] Pager- [phone] 12/16/2019

## 2019-12-21 ENCOUNTER — Telehealth: Payer: Self-pay

## 2019-12-21 NOTE — Telephone Encounter (Signed)
Pt husband called that she had constipation no bowel movement for few days as per adam advised take miralax 2 times a day and need appt if not feeling better pt had appt Friday

## 2019-12-23 ENCOUNTER — Encounter: Payer: Self-pay | Admitting: Oncology

## 2019-12-23 ENCOUNTER — Inpatient Hospital Stay: Payer: Medicare HMO

## 2019-12-23 ENCOUNTER — Other Ambulatory Visit: Payer: Self-pay

## 2019-12-23 ENCOUNTER — Inpatient Hospital Stay (HOSPITAL_BASED_OUTPATIENT_CLINIC_OR_DEPARTMENT_OTHER): Payer: Medicare HMO | Admitting: Oncology

## 2019-12-23 VITALS — BP 155/91 | HR 84 | Temp 98.1°F | Resp 18 | Wt 162.1 lb

## 2019-12-23 DIAGNOSIS — Z171 Estrogen receptor negative status [ER-]: Secondary | ICD-10-CM | POA: Diagnosis not present

## 2019-12-23 DIAGNOSIS — C50411 Malignant neoplasm of upper-outer quadrant of right female breast: Secondary | ICD-10-CM

## 2019-12-23 DIAGNOSIS — G620 Drug-induced polyneuropathy: Secondary | ICD-10-CM | POA: Diagnosis not present

## 2019-12-23 DIAGNOSIS — T451X5A Adverse effect of antineoplastic and immunosuppressive drugs, initial encounter: Secondary | ICD-10-CM | POA: Diagnosis not present

## 2019-12-23 DIAGNOSIS — G629 Polyneuropathy, unspecified: Secondary | ICD-10-CM | POA: Diagnosis not present

## 2019-12-23 DIAGNOSIS — Z5111 Encounter for antineoplastic chemotherapy: Secondary | ICD-10-CM | POA: Diagnosis not present

## 2019-12-23 DIAGNOSIS — E876 Hypokalemia: Secondary | ICD-10-CM

## 2019-12-23 DIAGNOSIS — Z95828 Presence of other vascular implants and grafts: Secondary | ICD-10-CM

## 2019-12-23 DIAGNOSIS — D6481 Anemia due to antineoplastic chemotherapy: Secondary | ICD-10-CM

## 2019-12-23 LAB — CBC WITH DIFFERENTIAL/PLATELET
Abs Immature Granulocytes: 0.03 10*3/uL (ref 0.00–0.07)
Basophils Absolute: 0.1 10*3/uL (ref 0.0–0.1)
Basophils Relative: 1 %
Eosinophils Absolute: 0.2 10*3/uL (ref 0.0–0.5)
Eosinophils Relative: 3 %
HCT: 29.2 % — ABNORMAL LOW (ref 36.0–46.0)
Hemoglobin: 9.8 g/dL — ABNORMAL LOW (ref 12.0–15.0)
Immature Granulocytes: 1 %
Lymphocytes Relative: 22 %
Lymphs Abs: 1.2 10*3/uL (ref 0.7–4.0)
MCH: 31 pg (ref 26.0–34.0)
MCHC: 33.6 g/dL (ref 30.0–36.0)
MCV: 92.4 fL (ref 80.0–100.0)
Monocytes Absolute: 0.5 10*3/uL (ref 0.1–1.0)
Monocytes Relative: 9 %
Neutro Abs: 3.6 10*3/uL (ref 1.7–7.7)
Neutrophils Relative %: 64 %
Platelets: 297 10*3/uL (ref 150–400)
RBC: 3.16 MIL/uL — ABNORMAL LOW (ref 3.87–5.11)
RDW: 16.1 % — ABNORMAL HIGH (ref 11.5–15.5)
WBC: 5.5 10*3/uL (ref 4.0–10.5)
nRBC: 0 % (ref 0.0–0.2)

## 2019-12-23 LAB — COMPREHENSIVE METABOLIC PANEL
ALT: 16 U/L (ref 0–44)
AST: 19 U/L (ref 15–41)
Albumin: 3.6 g/dL (ref 3.5–5.0)
Alkaline Phosphatase: 31 U/L — ABNORMAL LOW (ref 38–126)
Anion gap: 7 (ref 5–15)
BUN: 12 mg/dL (ref 8–23)
CO2: 25 mmol/L (ref 22–32)
Calcium: 9.1 mg/dL (ref 8.9–10.3)
Chloride: 106 mmol/L (ref 98–111)
Creatinine, Ser: 0.7 mg/dL (ref 0.44–1.00)
GFR calc Af Amer: >60 mL/min (ref >60)
GFR calc non Af Amer: >60 mL/min (ref >60)
Glucose, Bld: 162 mg/dL — ABNORMAL HIGH (ref 70–99)
Potassium: 3.8 mmol/L (ref 3.5–5.1)
Sodium: 138 mmol/L (ref 135–145)
Total Bilirubin: 0.5 mg/dL (ref 0.3–1.2)
Total Protein: 6.9 g/dL (ref 6.5–8.1)

## 2019-12-23 LAB — MAGNESIUM: Magnesium: 1.8 mg/dL (ref 1.7–2.4)

## 2019-12-23 MED ORDER — HEPARIN SOD (PORK) LOCK FLUSH 100 UNIT/ML IV SOLN
500.0000 [IU] | Freq: Once | INTRAVENOUS | Status: AC | PRN
  Administered 2019-12-23: 500 [IU]
  Filled 2019-12-23: qty 5

## 2019-12-23 MED ORDER — SODIUM CHLORIDE 0.9 % IV SOLN
Freq: Once | INTRAVENOUS | Status: AC
  Filled 2019-12-23: qty 250

## 2019-12-23 MED ORDER — FAMOTIDINE IN NACL 20-0.9 MG/50ML-% IV SOLN
20.0000 mg | Freq: Once | INTRAVENOUS | Status: AC
  Administered 2019-12-23: 20 mg via INTRAVENOUS
  Filled 2019-12-23: qty 50

## 2019-12-23 MED ORDER — HEPARIN SOD (PORK) LOCK FLUSH 100 UNIT/ML IV SOLN
INTRAVENOUS | Status: AC
  Filled 2019-12-23: qty 5

## 2019-12-23 MED ORDER — SODIUM CHLORIDE 0.9% FLUSH
10.0000 mL | INTRAVENOUS | Status: DC | PRN
  Administered 2019-12-23: 10 mL via INTRAVENOUS
  Filled 2019-12-23: qty 10

## 2019-12-23 MED ORDER — SODIUM CHLORIDE 0.9 % IV SOLN
10.0000 mg | Freq: Once | INTRAVENOUS | Status: AC
  Administered 2019-12-23: 10 mg via INTRAVENOUS
  Filled 2019-12-23: qty 10

## 2019-12-23 MED ORDER — SODIUM CHLORIDE 0.9 % IV SOLN
70.0000 mg/m2 | Freq: Once | INTRAVENOUS | Status: AC
  Administered 2019-12-23: 126 mg via INTRAVENOUS
  Filled 2019-12-23: qty 21

## 2019-12-23 MED ORDER — DIPHENHYDRAMINE HCL 50 MG/ML IJ SOLN
50.0000 mg | Freq: Once | INTRAMUSCULAR | Status: AC
  Administered 2019-12-23: 50 mg via INTRAVENOUS
  Filled 2019-12-23: qty 1

## 2019-12-23 NOTE — Progress Notes (Signed)
Hematology/Oncology follow up note [facility] Telephone:([phone] Fax:([phone]) [phone]   Patient Care Team: Lavera Guise, MD as PCP - General (Internal Medicine) Kate Sable, MD as PCP - Cardiology (Cardiology) Rico Junker, RN as Registered Nurse Theodore Demark, RN as Registered Nurse Earlie Server, MD as Consulting Physician (Oncology)  REFERRING PROVIDER: Lavera Guise, MD  CHIEF COMPLAINTS/REASON FOR VISIT:  Follow-up for breast cancer  HISTORY OF PRESENTING ILLNESS:   Dawn Alvarez is a  64 y.o.  female with PMH listed below was seen in consultation at the request of  Lavera Guise, MD  for evaluation of breast cancer Patient had screening mammogram done 08/12/2017 which showed right breast asymmetry.  A diagnostic mammogram was suggested and patient did not have it done. Patient felt her right breast mass for a few weeks.  She had bilateral diagnostic mammogram done on 06/30/2019. 2.8 x 2.4 x 2.6 cm right breast mass, 11:00, 10 cm from the nipple.  There is a single mildly abnormal node in the right axilla with a cortex measuring up to 4.4 mm.  No other suspicious findings. Patient underwent ultrasound-guided core biopsy of the right breast mass and right axilla lymph node Pathology showed invasive mammary carcinoma, no special type, grade 3, ER/PR HER-2 status are pending. Right axillary lymph node biopsy showed predominantly blood in the fibroadipose tissue, with scant lymphoid tissue present.  No definite malignancy was identified.  Patient was referred to cancer center to establish care and discuss treatment plan. Menarche 59 Postmenopausal.  LMP when she was 64 years old. She recalls use of birth control pills. Denies any hormone .  Replacement therapy. Denies any prior chest radiation. She reports family history of maternal grandmother and 2 maternal cousins were diagnosed with cancer.  She does not know about details. # 09/03/2019,  patient had an episode of syncope and EMS was called and patient sent to emergency room.  Patient was noted to have a heart rate 45-65 and blood glucose level of 286. Patient denies any history of nausea, vomiting, diarrhea.  Denies any dehydration.  She reported history of intermittent abdominal pain. CT head without contrast showed no acute intracranial abnormality.  CT abdomen pelvis with contrast is negative for evidence of acute abdominal abnormality.  Patient was given IV fluid. Patient was observed with holding her blood pressure medication including amlodipine, bisoprolol.  No arrhythmia was noted on telemetry.  Echocardiogram showed LVEF 60 to 65%.  No valvular abnormalities.  Beta-blocker and diuretics was discontinued.  # Patient was seen by cardiology Dr. Garen Lah for evaluation of syncope and was cleared for chemotherapy. # 10/03/2019, interval unilateral right diagnostic mammogram showed right breast mass 11:00 size has decreased to 3.7 x 1.3 x 2.6 cm.  Previously mass measured 2.8 x 2.4 x 2.6 cm  INTERVAL HISTORY Dawn Alvarez is a 64 y.o. female who has above history reviewed by me today presents for follow up visit for management of breast cancer, evaluation prior to chemotherapy. Problems and complaints are listed below: Patient reports tolerating chemotherapy well. She reports that her neuropathy symptoms are stable. Gabapentin is helping her neuropathy. Chronic fatigue is at baseline Appetite is fair.   . Review of Systems  Constitutional: Negative for appetite change, chills, fatigue and fever.  HENT:   Negative for hearing loss and voice change.   Eyes: Negative for eye problems.  Respiratory: Negative for chest tightness and cough.   Cardiovascular: Negative for chest pain.  Gastrointestinal: Negative for abdominal distention, abdominal pain and blood in stool.  Endocrine: Negative for hot flashes.  Genitourinary: Negative for difficulty urinating and frequency.     Musculoskeletal: Positive for arthralgias.  Skin: Negative for itching and rash.  Neurological: Positive for numbness. Negative for extremity weakness.  Hematological: Negative for adenopathy.  Psychiatric/Behavioral: Negative for confusion.    MEDICAL HISTORY:  Past Medical History:  Diagnosis Date  . Asthma   . Cancer ([place])    breast  . COPD exacerbation ([place]) 04/12/2016  . Diabetes mellitus without complication ([place])   . Hyperlipemia   . Hypertension   . Personal history of chemotherapy     SURGICAL HISTORY: Past Surgical History:  Procedure Laterality Date  . BREAST BIOPSY Right 07/05/2019   [place] bx venus marker, grade 3 invasive mammary carcinoma  . BREAST BIOPSY Right 07/05/2019   LN bx, hydromarker,PREDOMINANTLY BLOOD AND FIBROADIPOSE TISSUE, WITH SCANT LYMPHOID TISSUE PRESENT      . CESAREAN SECTION    . COLONOSCOPY WITH PROPOFOL N/A 09/11/2015   Procedure: COLONOSCOPY WITH PROPOFOL;  Surgeon: Lucilla Lame, MD;  Location: [facility] ENDOSCOPY;  Service: Endoscopy;  Laterality: N/A;  . IR IMAGING GUIDED PORT INSERTION  07/20/2019    SOCIAL HISTORY: Social History   Socioeconomic History  . Marital status: Married    Spouse name: Not on file  . Number of children: Not on file  . Years of education: Not on file  . Highest education level: Not on file  Occupational History  . Not on file  Tobacco Use  . Smoking status: Former Smoker    Packs/day: 1.00    Years: 2.00    Pack years: 2.00    Types: Cigarettes    Quit date: 05/19/2019    Years since quitting: 0.5  . Smokeless tobacco: Never Used  Substance and Sexual Activity  . Alcohol use: Not Currently  . Drug use: No  . Sexual activity: Never    Birth control/protection: Abstinence  Other Topics Concern  . Not on file  Social History Narrative  . Not on file   Social Determinants of Health   Financial Resource Strain:   . Difficulty of Paying Living Expenses:   Food Insecurity:   . Worried About Paediatric nurse in the Last Year:   . Arboriculturist in the Last Year:   Transportation Needs:   . Film/video editor (Medical):   Marland Kitchen Lack of Transportation (Non-Medical):   Physical Activity:   . Days of Exercise per Week:   . Minutes of Exercise per Session:   Stress:   . Feeling of Stress :   Social Connections:   . Frequency of Communication with Friends and Family:   . Frequency of Social Gatherings with Friends and Family:   . Attends Religious Services:   . Active Member of Clubs or Organizations:   . Attends Archivist Meetings:   Marland Kitchen Marital Status:   Intimate Partner Violence:   . Fear of Current or Ex-Partner:   . Emotionally Abused:   Marland Kitchen Physically Abused:   . Sexually Abused:     FAMILY HISTORY: Family History  Problem Relation Age of Onset  . Diabetes Mother   . Hypertension Mother   . Hypertension Father   . Diabetes Father     ALLERGIES:  has No Known Allergies.  MEDICATIONS:  Current Outpatient Medications  Medication Sig Dispense Refill  . Alcohol Swabs (B-D SINGLE USE SWABS REGULAR) PADS Use  as directed twice a day 300 each 3  . amLODipine (NORVASC) 10 MG tablet Take 5 mg by mouth once.     . Blood Glucose Monitoring Suppl (TRUE METRIX AIR GLUCOSE METER) w/Device KIT 1 Device by Does not apply route in the morning and at bedtime. Use ad directed e11.65 1 kit 0  . brimonidine (ALPHAGAN) 0.2 % ophthalmic solution Place 1 drop into both eyes 2 (two) times daily.    . Calcium Carb-Cholecalciferol (CALCIUM 600 + D) 600-200 MG-UNIT TABS Take 1 tablet by mouth daily.    Marland Kitchen gabapentin (NEURONTIN) 100 MG capsule Take 1 capsule (100 mg total) by mouth 2 (two) times daily. 60 capsule 0  . glucose blood (TRUE METRIX BLOOD GLUCOSE TEST) test strip Use as instructed twice a daily diag e11.65 100 each 3  . insulin detemir (LEVEMIR FLEXTOUCH) 100 UNIT/ML FlexPen Inject 44 Units into the skin daily. 15 mL 3  . Insulin Pen Needle (PEN NEEDLES) 31G X 8 MM MISC Use  as directed with insulin E11.65 100 each 1  . Lancets Misc. (ACCU-CHEK MULTICLIX LANCET DEV) KIT by Does not apply route. Use as directed twice a day diag E11.65    . lidocaine-prilocaine (EMLA) cream     . lisinopril (ZESTRIL) 20 MG tablet     . nitrofurantoin, macrocrystal-monohydrate, (MACROBID) 100 MG capsule Take 1 capsule (100 mg total) by mouth 2 (two) times daily. 14 capsule 0  . Omega-3 Fatty Acids (FISH OIL) 1000 MG CAPS Take 1,000 mg by mouth daily.     . potassium chloride (KLOR-CON) 10 MEQ tablet Take 1 tablet (10 mEq total) by mouth daily. 30 tablet 0  . prochlorperazine (COMPAZINE) 10 MG tablet Take 1 tablet (10 mg total) by mouth every 6 (six) hours as needed for nausea or vomiting. 60 tablet 1  . Semaglutide, 1 MG/DOSE, (OZEMPIC, 1 MG/DOSE,) 2 MG/1.5ML SOPN Inject 1 mg into the skin once a week. 5 pen 3  . SIMBRINZA 1-0.2 % SUSP     . TRUEplus Lancets 30G MISC Use as directed twice daily diag e11.65 100 each 3   No current facility-administered medications for this visit.   Facility-Administered Medications Ordered in Other Visits  Medication Dose Route Frequency Provider Last Rate Last Admin  . sodium chloride flush (NS) 0.9 % injection 10 mL  10 mL Intravenous PRN [name], MD   10 mL at 10/11/19 0817     PHYSICAL EXAMINATION: ECOG PERFORMANCE STATUS: 1 - Symptomatic but completely ambulatory There were no vitals filed for this visit. There were no vitals filed for this visit.  Physical Exam Constitutional:      General: She is not in acute distress. HENT:     Head: Normocephalic and atraumatic.  Eyes:     General: No scleral icterus. Cardiovascular:     Rate and Rhythm: Normal rate and regular rhythm.     Heart sounds: Normal heart sounds.  Pulmonary:     Effort: Pulmonary effort is normal. No respiratory distress.     Breath sounds: Normal breath sounds. No wheezing.  Abdominal:     General: Bowel sounds are normal. There is no distension.     Palpations:  Abdomen is soft.  Musculoskeletal:        General: No deformity. Normal range of motion.     Cervical back: Normal range of motion and neck supple.  Skin:    General: Skin is warm and dry.     Findings: No erythema or rash.  Neurological:     Mental Status: She is alert and oriented to person, place, and time. Mental status is at baseline.     Cranial Nerves: No cranial nerve deficit.     Coordination: Coordination normal.  Psychiatric:        Mood and Affect: Mood normal.      LABORATORY DATA:  I have reviewed the data as listed Lab Results  Component Value Date   WBC 5.7 12/16/2019   HGB 9.3 (L) 12/16/2019   HCT 27.7 (L) 12/16/2019   MCV 91.7 12/16/2019   PLT 292 12/16/2019   Recent Labs    12/02/19 0829 12/09/19 0837 12/16/19 0841  NA 138 137 136  K 3.8 3.3* 3.9  CL 106 102 103  CO2 '23 26 26  '$ GLUCOSE 104* 160* 154*  BUN '11 11 15  '$ CREATININE 0.86 0.80 0.82  CALCIUM 8.8* 8.8* 9.2  GFRNONAA >60 >60 >60  GFRAA >60 >60 >60  PROT 7.0 6.5 6.8  ALBUMIN 3.7 3.4* 3.7  AST '18 17 20  '$ ALT '15 13 17  '$ ALKPHOS 27* 29* 33*  BILITOT 0.4 0.3 0.4   Iron/TIBC/Ferritin/ %Sat No results found for: IRON, TIBC, FERRITIN, IRONPCTSAT    RADIOGRAPHIC STUDIES: I have personally reviewed the radiological images as listed and agreed with the findings in the report. US Breast Limited Uni Right Inc Axilla  Result Date: 10/03/2019 CLINICAL DATA:  Ultrasound-guided core biopsy of mass in the RIGHT breast 10-11 o'clock location performed 07/05/2019, showing grade 3 invasive mammary carcinoma. Ultrasound-guided core biopsy of RIGHT axillary lymph node was benign and concordant. Patient has undergone neoadjuvant treatment scans feels the mass has gotten smaller. Assess for response to treatment. EXAM: DIGITAL DIAGNOSTIC RIGHT MAMMOGRAM WITH CAD AND TOMO ULTRASOUND RIGHT BREAST COMPARISON:  07/05/2019 and earlier ACR Breast Density Category b: There are scattered areas of fibroglandular density.  FINDINGS: Within the UPPER-OUTER QUADRANT of the RIGHT breast there is a spiculated mass, marked with a tissue marking clip. Lesion appears smaller compared to prior study. No new suspicious findings in the RIGHT breast. Mammographic images were processed with CAD. Targeted ultrasound is performed, showingirregular hypoechoic mass in the 11 o'clock location of the RIGHT breast 10 centimeters from the nipple, measuring 2.7 x 1.3 x 2.6 centimeters. Previously mass measured 2.8 x 2.4 x 2.6 centimeters. IMPRESSION: Smaller mass in the 11 o'clock location of the RIGHT breast. RECOMMENDATION: Treatment plan for known RIGHT breast cancer. I have discussed the findings and recommendations with the patient. If applicable, a reminder letter will be sent to the patient regarding the next appointment. BI-RADS CATEGORY  6: Known biopsy-proven malignancy. Electronically Signed   By: Nolon Nations M.D.   On: 10/03/2019 14:36   MM DIAG BREAST TOMO UNI RIGHT  Result Date: 10/03/2019 CLINICAL DATA:  Ultrasound-guided core biopsy of mass in the RIGHT breast 10-11 o'clock location performed 07/05/2019, showing grade 3 invasive mammary carcinoma. Ultrasound-guided core biopsy of RIGHT axillary lymph node was benign and concordant. Patient has undergone neoadjuvant treatment scans feels the mass has gotten smaller. Assess for response to treatment. EXAM: DIGITAL DIAGNOSTIC RIGHT MAMMOGRAM WITH CAD AND TOMO ULTRASOUND RIGHT BREAST COMPARISON:  07/05/2019 and earlier ACR Breast Density Category b: There are scattered areas of fibroglandular density. FINDINGS: Within the UPPER-OUTER QUADRANT of the RIGHT breast there is a spiculated mass, marked with a tissue marking clip. Lesion appears smaller compared to prior study. No new suspicious findings in the RIGHT breast. Mammographic images were processed with CAD. Targeted  ultrasound is performed, showingirregular hypoechoic mass in the 11 o'clock location of the RIGHT breast 10  centimeters from the nipple, measuring 2.7 x 1.3 x 2.6 centimeters. Previously mass measured 2.8 x 2.4 x 2.6 centimeters. IMPRESSION: Smaller mass in the 11 o'clock location of the RIGHT breast. RECOMMENDATION: Treatment plan for known RIGHT breast cancer. I have discussed the findings and recommendations with the patient. If applicable, a reminder letter will be sent to the patient regarding the next appointment. BI-RADS CATEGORY  6: Known biopsy-proven malignancy. Electronically Signed   By: Nolon Nations M.D.   On: 10/03/2019 14:36   DG HIP UNILAT WITH PELVIS MIN 4 VIEWS RIGHT  Result Date: 11/17/2019 CLINICAL DATA:  64 year old female with right hip pain. EXAM: DG HIP (WITH OR WITHOUT PELVIS) 4+V RIGHT COMPARISON:  CT of the abdomen pelvis dated 09/03/2019. FINDINGS: There is no acute fracture or dislocation. The bones are mildly osteopenic. There is mild bilateral hip osteoarthritic changes. There is degenerative changes of the lower lumbar spine primarily at L4-L5 with endplate irregularity, disc degeneration and osteophyte. Calcified uterine fibroid noted over the pelvis. The soft tissues are unremarkable. Vascular calcifications noted. IMPRESSION: 1. No acute fracture or dislocation. 2. Mild bilateral hip osteoarthritic changes. Electronically Signed   By: Anner Crete M.D.   On: 11/17/2019 21:47      ASSESSMENT & PLAN:  1. Malignant neoplasm of upper-outer quadrant of right female breast, unspecified estrogen receptor status ([place])   2. Encounter for antineoplastic chemotherapy   3. Antineoplastic chemotherapy induced anemia   4. Neuropathy   5. Hypokalemia    cT2N0 grade 3 invasive mammary carcinoma.  ER negative, PR weakly positive (<=10%), HER-2 negative Status post 4 cycles of ddAC with growth factor support. Patient is currently on weekly Taxol.   overall she tolerates chemotherapy. Labs are reviewed and discussed with patient.   Counts acceptable to proceed with Taxol  treatments today. Patient has 1 more dose of Taxol.  After that plan to proceed with image for evaluation of treatment response.  #Chemotherapy-induced neuropathy, in the context of diabetes Taxol decreased to 70 mg/m.  Continue gabapentin 100 mg 2-3 times daily.  #Anemia, secondary to chemotherapy.  Hemoglobin stable at 9.8.  Continue to monitor. Chronic hypokalemia, potassium level has improved.  Continue oral potassium chloride 10 mEq daily  All questions were answered. The patient knows to call the clinic with any problems questions or concerns. Return of visit: 1 week.  Earlie Server, MD, PhD Hematology Oncology [facility] Pager- [phone] 12/23/2019

## 2019-12-23 NOTE — Progress Notes (Signed)
Patient here for follow up. Reports she had been having constipation and started taking bisacodyl, which is helping

## 2019-12-27 ENCOUNTER — Telehealth: Payer: Self-pay

## 2019-12-27 NOTE — Telephone Encounter (Signed)
Lmom to confirm and screen for 12-29-19 ov. 

## 2019-12-27 NOTE — Telephone Encounter (Signed)
Confirmed and screened for 12-29-19 ov. 

## 2019-12-29 ENCOUNTER — Ambulatory Visit (INDEPENDENT_AMBULATORY_CARE_PROVIDER_SITE_OTHER): Payer: Medicaid Other | Admitting: Nurse Practitioner

## 2019-12-29 ENCOUNTER — Other Ambulatory Visit: Payer: Self-pay

## 2019-12-29 ENCOUNTER — Encounter: Payer: Self-pay | Admitting: Nurse Practitioner

## 2019-12-29 VITALS — BP 128/80 | HR 106 | Temp 97.4°F | Resp 16 | Ht 64.0 in | Wt 166.2 lb

## 2019-12-29 DIAGNOSIS — Z794 Long term (current) use of insulin: Secondary | ICD-10-CM | POA: Diagnosis not present

## 2019-12-29 DIAGNOSIS — E1165 Type 2 diabetes mellitus with hyperglycemia: Secondary | ICD-10-CM

## 2019-12-29 DIAGNOSIS — Z171 Estrogen receptor negative status [ER-]: Secondary | ICD-10-CM

## 2019-12-29 DIAGNOSIS — C50411 Malignant neoplasm of upper-outer quadrant of right female breast: Secondary | ICD-10-CM

## 2019-12-29 DIAGNOSIS — I10 Essential (primary) hypertension: Secondary | ICD-10-CM | POA: Diagnosis not present

## 2019-12-29 LAB — POCT GLYCOSYLATED HEMOGLOBIN (HGB A1C): Hemoglobin A1C: 6.3 % — AB (ref 4.0–5.6)

## 2019-12-29 MED ORDER — LEVEMIR FLEXTOUCH 100 UNIT/ML ~~LOC~~ SOPN: 44.0000 [IU] | PEN_INJECTOR | Freq: Every day | SUBCUTANEOUS | 3 refills | Status: DC

## 2019-12-29 MED ORDER — PEN NEEDLES 31G X 8 MM MISC: 1 refills | Status: DC

## 2019-12-29 MED ORDER — LISINOPRIL 20 MG PO TABS: 20.0000 mg | ORAL_TABLET | Freq: Every day | ORAL | 3 refills | Status: DC

## 2019-12-29 MED ORDER — GABAPENTIN 100 MG PO CAPS: 100.0000 mg | ORAL_CAPSULE | Freq: Two times a day (BID) | ORAL | 3 refills | Status: DC

## 2019-12-29 MED ORDER — AMLODIPINE BESYLATE 10 MG PO TABS: ORAL_TABLET | ORAL | 3 refills | Status: DC

## 2019-12-29 NOTE — Progress Notes (Signed)
[facility] [place], [place] [address]  Internal MEDICINE  Office Visit Note  Patient Name: Dawn Alvarez  [id number]  [id number]  Date of Service: 01/22/2020  Chief Complaint  Patient presents with  . Follow-up  . Diabetes  . Hypertension  . Hyperlipidemia  . Quality Metric Gaps    TDAP    The patient is here for follow up regarding to diabetes. Her basal insulin dose has been increased to 44 units daily. She does take prn regular insulin for mealtime coverage. She states that she is tolerating the dosing well. Her HgbA1c has improved from 10.6 to 6.3 today.  Her blood pressure is well controlled. She continues to have treatment for breast caner. She is followed per oncology. She was advised to postpone taking COVID 19 vaccines at this time. She states that she has no new concerns or complaints.       Current Medication: Outpatient Encounter Medications as of 12/29/2019  Medication Sig  . Alcohol Swabs (B-D SINGLE USE SWABS REGULAR) PADS Use as directed twice a day  . amLODipine (NORVASC) 10 MG tablet Take 1/2 tablet po QD  . bisacodyl (DULCOLAX) 5 MG EC tablet Take 5 mg by mouth daily as needed for moderate constipation.  . Blood Glucose Monitoring Suppl (TRUE METRIX AIR GLUCOSE METER) w/Device KIT 1 Device by Does not apply route in the morning and at bedtime. Use ad directed e11.65  . brimonidine (ALPHAGAN) 0.2 % ophthalmic solution Place 1 drop into both eyes 2 (two) times daily.  . Calcium Carb-Cholecalciferol (CALCIUM 600 + D) 600-200 MG-UNIT TABS Take 1 tablet by mouth daily.  Marland Kitchen gabapentin (NEURONTIN) 100 MG capsule Take 1 capsule (100 mg total) by mouth 2 (two) times daily.  Marland Kitchen glucose blood (TRUE METRIX BLOOD GLUCOSE TEST) test strip Use as instructed twice a daily diag e11.65  . insulin detemir (LEVEMIR FLEXTOUCH) 100 UNIT/ML FlexPen Inject 44 Units into the skin daily.  . Insulin Pen Needle (PEN NEEDLES) 31G X 8 MM MISC Use as directed with  insulin E11.65  . Lancets Misc. (ACCU-CHEK MULTICLIX LANCET DEV) KIT by Does not apply route. Use as directed twice a day diag E11.65  . lidocaine-prilocaine (EMLA) cream   . lisinopril (ZESTRIL) 20 MG tablet Take 1 tablet (20 mg total) by mouth daily.  . nitrofurantoin, macrocrystal-monohydrate, (MACROBID) 100 MG capsule Take 1 capsule (100 mg total) by mouth 2 (two) times daily. (Patient not taking: Reported on 01/02/2020)  . Omega-3 Fatty Acids (FISH OIL) 1000 MG CAPS Take 1,000 mg by mouth daily.   . potassium chloride (KLOR-CON) 10 MEQ tablet Take 1 tablet (10 mEq total) by mouth daily.  . prochlorperazine (COMPAZINE) 10 MG tablet Take 1 tablet (10 mg total) by mouth every 6 (six) hours as needed for nausea or vomiting.  . Semaglutide, 1 MG/DOSE, (OZEMPIC, 1 MG/DOSE,) 2 MG/1.5ML SOPN Inject 1 mg into the skin once a week.  Marland Kitchen SIMBRINZA 1-0.2 % SUSP   . TRUEplus Lancets 30G MISC Use as directed twice daily diag e11.65  . [DISCONTINUED] amLODipine (NORVASC) 10 MG tablet Take 5 mg by mouth once.   . [DISCONTINUED] gabapentin (NEURONTIN) 100 MG capsule Take 1 capsule (100 mg total) by mouth 2 (two) times daily.  . [DISCONTINUED] insulin detemir (LEVEMIR FLEXTOUCH) 100 UNIT/ML FlexPen Inject 44 Units into the skin daily.  . [DISCONTINUED] Insulin Pen Needle (PEN NEEDLES) 31G X 8 MM MISC Use as directed with insulin E11.65  . [DISCONTINUED] lisinopril (ZESTRIL)  20 MG tablet    Facility-Administered Encounter Medications as of 12/29/2019  Medication  . sodium chloride flush (NS) 0.9 % injection 10 mL    Surgical History: Past Surgical History:  Procedure Laterality Date  . BREAST BIOPSY Right 07/05/2019   [place] bx venus marker, grade 3 invasive mammary carcinoma  . BREAST BIOPSY Right 07/05/2019   LN bx, hydromarker,PREDOMINANTLY BLOOD AND FIBROADIPOSE TISSUE, WITH SCANT LYMPHOID TISSUE PRESENT      . CESAREAN SECTION    . COLONOSCOPY WITH PROPOFOL N/A 09/11/2015   Procedure: COLONOSCOPY WITH  PROPOFOL;  Surgeon: Lucilla Lame, MD;  Location: [facility] ENDOSCOPY;  Service: Endoscopy;  Laterality: N/A;  . IR IMAGING GUIDED PORT INSERTION  07/20/2019    Medical History: Past Medical History:  Diagnosis Date  . Asthma   . Cancer ([place])    breast  . COPD exacerbation ([place]) 04/12/2016  . Diabetes mellitus without complication ([place])   . Hyperlipemia   . Hypertension   . Personal history of chemotherapy 2021    Family History: Family History  Problem Relation Age of Onset  . Diabetes Mother   . Hypertension Mother   . Hypertension Father   . Diabetes Father   . Breast cancer Neg Hx     Social History   Socioeconomic History  . Marital status: Married    Spouse name: Not on file  . Number of children: Not on file  . Years of education: Not on file  . Highest education level: Not on file  Occupational History  . Not on file  Tobacco Use  . Smoking status: Former Smoker    Packs/day: 1.00    Years: 2.00    Pack years: 2.00    Types: Cigarettes    Quit date: 05/19/2019    Years since quitting: 0.6  . Smokeless tobacco: Never Used  Substance and Sexual Activity  . Alcohol use: Not Currently  . Drug use: No  . Sexual activity: Never    Birth control/protection: Abstinence  Other Topics Concern  . Not on file  Social History Narrative  . Not on file   Social Determinants of Health   Financial Resource Strain:   . Difficulty of Paying Living Expenses: Not on file  Food Insecurity:   . Worried About Charity fundraiser in the Last Year: Not on file  . Ran Out of Food in the Last Year: Not on file  Transportation Needs:   . Lack of Transportation (Medical): Not on file  . Lack of Transportation (Non-Medical): Not on file  Physical Activity:   . Days of Exercise per Week: Not on file  . Minutes of Exercise per Session: Not on file  Stress:   . Feeling of Stress : Not on file  Social Connections:   . Frequency of Communication with Friends and Family: Not on  file  . Frequency of Social Gatherings with Friends and Family: Not on file  . Attends Religious Services: Not on file  . Active Member of Clubs or Organizations: Not on file  . Attends Archivist Meetings: Not on file  . Marital Status: Not on file  Intimate Partner Violence:   . Fear of Current or Ex-Partner: Not on file  . Emotionally Abused: Not on file  . Physically Abused: Not on file  . Sexually Abused: Not on file      Review of Systems  Constitutional: Negative for activity change, chills, fatigue and unexpected weight change.  HENT: Negative for  congestion, postnasal drip, rhinorrhea, sneezing and sore throat.   Respiratory: Negative for cough, chest tightness, shortness of breath and wheezing.   Cardiovascular: Negative for chest pain and palpitations.       Well managed blood pressure.   Gastrointestinal: Negative for abdominal pain, constipation, diarrhea, nausea and vomiting.  Endocrine: Negative for cold intolerance, heat intolerance, polydipsia and polyuria.       Blood sugars are much improved.   Musculoskeletal: Negative for arthralgias, back pain, joint swelling and neck pain.  Skin: Negative for rash.  Allergic/Immunologic: Negative for environmental allergies.  Neurological: Negative for dizziness, tremors, numbness and headaches.  Hematological: Negative for adenopathy. Does not bruise/bleed easily.  Psychiatric/Behavioral: Negative for behavioral problems (Depression), sleep disturbance and suicidal ideas. The patient is not nervous/anxious.     Today's Vitals   12/29/19 1042  BP: 128/80  Pulse: (!) 106  Resp: 16  Temp: (!) 97.4 F (36.3 C)  SpO2: 100%  Weight: 166 lb 3.2 oz (75.4 kg)   Body mass index is 28.53 kg/m.  Physical Exam Vitals and nursing note reviewed.  Constitutional:      General: She is not in acute distress.    Appearance: Normal appearance. She is well-developed. She is not diaphoretic.  HENT:     Head:  Normocephalic and atraumatic.     Nose: Nose normal.     Mouth/Throat:     Pharynx: No oropharyngeal exudate.  Eyes:     Pupils: Pupils are equal, round, and reactive to light.  Neck:     Thyroid: No thyromegaly.     Vascular: No carotid bruit or JVD.     Trachea: No tracheal deviation.  Cardiovascular:     Rate and Rhythm: Normal rate and regular rhythm.     Heart sounds: Normal heart sounds. No murmur heard.  No friction rub. No gallop.   Pulmonary:     Effort: Pulmonary effort is normal. No respiratory distress.     Breath sounds: Normal breath sounds. No wheezing or rales.  Chest:     Chest wall: No tenderness.  Abdominal:     Palpations: Abdomen is soft.  Musculoskeletal:        General: Normal range of motion.     Cervical back: Normal range of motion and neck supple.  Lymphadenopathy:     Cervical: No cervical adenopathy.  Skin:    General: Skin is warm and dry.  Neurological:     Mental Status: She is alert and oriented to person, place, and time.     Cranial Nerves: No cranial nerve deficit.  Psychiatric:        Mood and Affect: Mood normal.        Behavior: Behavior normal.        Thought Content: Thought content normal.        Judgment: Judgment normal.    Assessment/Plan: 1. Type 2 diabetes mellitus with hyperglycemia, with long-term current use of insulin (HCC) - POCT HgB A1C 6.3 today, down from 10.6 at the most recent check. Continue levemir as prescribed. Check sugars regularly. Will adjust dosing as indicated.  - insulin detemir (LEVEMIR FLEXTOUCH) 100 UNIT/ML FlexPen; Inject 44 Units into the skin daily.  Dispense: 15 mL; Refill: 3 - Insulin Pen Needle (PEN NEEDLES) 31G X 8 MM MISC; Use as directed with insulin E11.65  Dispense: 100 each; Refill: 1 - gabapentin (NEURONTIN) 100 MG capsule; Take 1 capsule (100 mg total) by mouth 2 (two) times daily.  Dispense: 180 capsule;  Refill: 3  2. Essential hypertension Well managed. Continue bp medication as  prescribed  - lisinopril (ZESTRIL) 20 MG tablet; Take 1 tablet (20 mg total) by mouth daily.  Dispense: 90 tablet; Refill: 3 - amLODipine (NORVASC) 10 MG tablet; Take 1/2 tablet po QD  Dispense: 45 tablet; Refill: 3  3. Malignant neoplasm of upper-outer quadrant of right breast in female, estrogen receptor negative ([place]) Continue regular visits with oncology and treatments as scheduled.   General Counseling: Mykia verbalizes understanding of the findings of todays visit and agrees with plan of treatment. I have discussed any further diagnostic evaluation that may be needed or ordered today. We also reviewed her medications today. she has been encouraged to call the office with any questions or concerns that should arise related to todays visit.  Diabetes Counseling:  1. Addition of ACE inh/ ARB'S for nephroprotection. Microalbumin is updated  2. Diabetic foot care, prevention of complications. Podiatry consult 3. Exercise and lose weight.  4. Diabetic eye examination, Diabetic eye exam is updated  5. Monitor blood sugar closlely. nutrition counseling.  6. Sign and symptoms of hypoglycemia including shaking sweating,confusion and headaches.  This patient was seen by Leretha Pol FNP Collaboration with Dr Lavera Guise as a part of collaborative care agreement  Orders Placed This Encounter  Procedures  . POCT HgB A1C    Meds ordered this encounter  Medications  . insulin detemir (LEVEMIR FLEXTOUCH) 100 UNIT/ML FlexPen    Sig: Inject 44 Units into the skin daily.    Dispense:  15 mL    Refill:  3    Order Specific Question:   Supervising Provider    Answer:   Lavera Guise [[id number]]  . Insulin Pen Needle (PEN NEEDLES) 31G X 8 MM MISC    Sig: Use as directed with insulin E11.65    Dispense:  100 each    Refill:  1    Order Specific Question:   Supervising Provider    Answer:   Lavera Guise [place]  . lisinopril (ZESTRIL) 20 MG tablet    Sig: Take 1 tablet (20 mg total) by mouth  daily.    Dispense:  90 tablet    Refill:  3    Order Specific Question:   Supervising Provider    Answer:   Lavera Guise [[id number]]  . gabapentin (NEURONTIN) 100 MG capsule    Sig: Take 1 capsule (100 mg total) by mouth 2 (two) times daily.    Dispense:  180 capsule    Refill:  3    Order Specific Question:   Supervising Provider    Answer:   Lavera Guise [[id number]]  . amLODipine (NORVASC) 10 MG tablet    Sig: Take 1/2 tablet po QD    Dispense:  45 tablet    Refill:  3    Order Specific Question:   Supervising Provider    Answer:   Lavera Guise [[id number]]    Total time spent: 30 Minutes   Time spent includes review of chart, medications, test results, and follow up plan with the patient.      Dr Lavera Guise Internal medicine

## 2020-01-02 ENCOUNTER — Other Ambulatory Visit: Payer: Self-pay

## 2020-01-02 ENCOUNTER — Inpatient Hospital Stay: Payer: Medicare HMO | Attending: Oncology

## 2020-01-02 ENCOUNTER — Encounter: Payer: Self-pay | Admitting: *Deleted

## 2020-01-02 ENCOUNTER — Encounter: Payer: Self-pay | Admitting: Oncology

## 2020-01-02 ENCOUNTER — Inpatient Hospital Stay (HOSPITAL_BASED_OUTPATIENT_CLINIC_OR_DEPARTMENT_OTHER): Payer: Medicare HMO | Admitting: Oncology

## 2020-01-02 ENCOUNTER — Inpatient Hospital Stay: Payer: Medicare HMO

## 2020-01-02 VITALS — BP 151/89 | HR 85 | Temp 96.8°F | Resp 18 | Wt 165.3 lb

## 2020-01-02 DIAGNOSIS — D6481 Anemia due to antineoplastic chemotherapy: Secondary | ICD-10-CM

## 2020-01-02 DIAGNOSIS — E876 Hypokalemia: Secondary | ICD-10-CM

## 2020-01-02 DIAGNOSIS — T451X5A Adverse effect of antineoplastic and immunosuppressive drugs, initial encounter: Secondary | ICD-10-CM

## 2020-01-02 DIAGNOSIS — C50411 Malignant neoplasm of upper-outer quadrant of right female breast: Secondary | ICD-10-CM | POA: Diagnosis not present

## 2020-01-02 DIAGNOSIS — G629 Polyneuropathy, unspecified: Secondary | ICD-10-CM

## 2020-01-02 DIAGNOSIS — Z171 Estrogen receptor negative status [ER-]: Secondary | ICD-10-CM | POA: Diagnosis not present

## 2020-01-02 DIAGNOSIS — Z5111 Encounter for antineoplastic chemotherapy: Secondary | ICD-10-CM

## 2020-01-02 LAB — CBC WITH DIFFERENTIAL/PLATELET
Abs Immature Granulocytes: 0.02 10*3/uL (ref 0.00–0.07)
Basophils Absolute: 0.1 10*3/uL (ref 0.0–0.1)
Basophils Relative: 1 %
Eosinophils Absolute: 0.1 10*3/uL (ref 0.0–0.5)
Eosinophils Relative: 2 %
HCT: 29.6 % — ABNORMAL LOW (ref 36.0–46.0)
Hemoglobin: 9.9 g/dL — ABNORMAL LOW (ref 12.0–15.0)
Immature Granulocytes: 0 %
Lymphocytes Relative: 23 %
Lymphs Abs: 1.1 10*3/uL (ref 0.7–4.0)
MCH: 30.7 pg (ref 26.0–34.0)
MCHC: 33.4 g/dL (ref 30.0–36.0)
MCV: 91.6 fL (ref 80.0–100.0)
Monocytes Absolute: 0.6 10*3/uL (ref 0.1–1.0)
Monocytes Relative: 13 %
Neutro Abs: 2.9 10*3/uL (ref 1.7–7.7)
Neutrophils Relative %: 61 %
Platelets: 286 10*3/uL (ref 150–400)
RBC: 3.23 MIL/uL — ABNORMAL LOW (ref 3.87–5.11)
RDW: 15.7 % — ABNORMAL HIGH (ref 11.5–15.5)
WBC: 4.7 10*3/uL (ref 4.0–10.5)
nRBC: 0 % (ref 0.0–0.2)

## 2020-01-02 LAB — COMPREHENSIVE METABOLIC PANEL
ALT: 15 U/L (ref 0–44)
AST: 19 U/L (ref 15–41)
Albumin: 3.6 g/dL (ref 3.5–5.0)
Alkaline Phosphatase: 39 U/L (ref 38–126)
Anion gap: 8 (ref 5–15)
BUN: 13 mg/dL (ref 8–23)
CO2: 22 mmol/L (ref 22–32)
Calcium: 8.7 mg/dL — ABNORMAL LOW (ref 8.9–10.3)
Chloride: 106 mmol/L (ref 98–111)
Creatinine, Ser: 0.82 mg/dL (ref 0.44–1.00)
GFR calc Af Amer: >60 mL/min (ref >60)
GFR calc non Af Amer: >60 mL/min (ref >60)
Glucose, Bld: 190 mg/dL — ABNORMAL HIGH (ref 70–99)
Potassium: 3.6 mmol/L (ref 3.5–5.1)
Sodium: 136 mmol/L (ref 135–145)
Total Bilirubin: 0.4 mg/dL (ref 0.3–1.2)
Total Protein: 6.6 g/dL (ref 6.5–8.1)

## 2020-01-02 LAB — MAGNESIUM: Magnesium: 1.8 mg/dL (ref 1.7–2.4)

## 2020-01-02 MED ORDER — SODIUM CHLORIDE 0.9 % IV SOLN
70.0000 mg/m2 | Freq: Once | INTRAVENOUS | Status: AC
  Administered 2020-01-02: 126 mg via INTRAVENOUS
  Filled 2020-01-02: qty 21

## 2020-01-02 MED ORDER — SODIUM CHLORIDE 0.9% FLUSH
10.0000 mL | Freq: Once | INTRAVENOUS | Status: AC
  Administered 2020-01-02: 10 mL via INTRAVENOUS
  Filled 2020-01-02: qty 10

## 2020-01-02 MED ORDER — DIPHENHYDRAMINE HCL 50 MG/ML IJ SOLN
50.0000 mg | Freq: Once | INTRAMUSCULAR | Status: AC
  Administered 2020-01-02: 50 mg via INTRAVENOUS
  Filled 2020-01-02: qty 1

## 2020-01-02 MED ORDER — HEPARIN SOD (PORK) LOCK FLUSH 100 UNIT/ML IV SOLN
500.0000 [IU] | Freq: Once | INTRAVENOUS | Status: AC | PRN
  Administered 2020-01-02: 500 [IU]
  Filled 2020-01-02: qty 5

## 2020-01-02 MED ORDER — SODIUM CHLORIDE 0.9 % IV SOLN
Freq: Once | INTRAVENOUS | Status: AC
  Filled 2020-01-02: qty 250

## 2020-01-02 MED ORDER — SODIUM CHLORIDE 0.9 % IV SOLN
10.0000 mg | Freq: Once | INTRAVENOUS | Status: AC
  Administered 2020-01-02: 10 mg via INTRAVENOUS
  Filled 2020-01-02: qty 10

## 2020-01-02 MED ORDER — FAMOTIDINE IN NACL 20-0.9 MG/50ML-% IV SOLN
20.0000 mg | Freq: Once | INTRAVENOUS | Status: AC
  Administered 2020-01-02: 20 mg via INTRAVENOUS
  Filled 2020-01-02: qty 50

## 2020-01-02 MED ORDER — HEPARIN SOD (PORK) LOCK FLUSH 100 UNIT/ML IV SOLN
INTRAVENOUS | Status: AC
  Filled 2020-01-02: qty 5

## 2020-01-02 NOTE — Progress Notes (Signed)
Hematology/Oncology follow up note [facility] [phone] [phone]   Patient Care Team: Lavera Guise, MD as PCP - General (Internal Medicine) Kate Sable, MD as PCP - Cardiology (Cardiology) Rico Junker, RN as Registered Nurse Theodore Demark, RN as Registered Nurse Earlie Server, MD as Consulting Physician (Oncology)  REFERRING PROVIDER: Lavera Guise, MD  CHIEF COMPLAINTS/REASON FOR VISIT:  Follow-up for breast cancer  HISTORY OF PRESENTING ILLNESS:   Dawn Alvarez is a  64 y.o.  female with PMH listed below was seen in consultation at the request of  Lavera Guise, MD  for evaluation of breast cancer Patient had screening mammogram done 08/12/2017 which showed right breast asymmetry.  A diagnostic mammogram was suggested and patient did not have it done. Patient felt her right breast mass for a few weeks.  She had bilateral diagnostic mammogram done on 06/30/2019. 2.8 x 2.4 x 2.6 cm right breast mass, 11:00, 10 cm from the nipple.  There is a single mildly abnormal node in the right axilla with a cortex measuring up to 4.4 mm.  No other suspicious findings. Patient underwent ultrasound-guided core biopsy of the right breast mass and right axilla lymph node Pathology showed invasive mammary carcinoma, no special type, grade 3, ER/PR HER-2 status are pending. Right axillary lymph node biopsy showed predominantly blood in the fibroadipose tissue, with scant lymphoid tissue present.  No definite malignancy was identified.  Patient was referred to cancer center to establish care and discuss treatment plan. Menarche 52 Postmenopausal.  LMP when she was 64 years old. She recalls use of birth control pills. Denies any hormone .  Replacement therapy. Denies any prior chest radiation. She reports family history of maternal grandmother and 2 maternal cousins were diagnosed with cancer.  She does not know about details. # 09/03/2019,  patient had an episode of syncope and EMS was called and patient sent to emergency room.  Patient was noted to have a heart rate 45-65 and blood glucose level of 286. Patient denies any history of nausea, vomiting, diarrhea.  Denies any dehydration.  She reported history of intermittent abdominal pain. CT head without contrast showed no acute intracranial abnormality.  CT abdomen pelvis with contrast is negative for evidence of acute abdominal abnormality.  Patient was given IV fluid. Patient was observed with holding her blood pressure medication including amlodipine, bisoprolol.  No arrhythmia was noted on telemetry.  Echocardiogram showed LVEF 60 to 65%.  No valvular abnormalities.  Beta-blocker and diuretics was discontinued.  # Patient was seen by cardiology Dr. Garen Lah for evaluation of syncope and was cleared for chemotherapy. # 10/03/2019, interval unilateral right diagnostic mammogram showed right breast mass 11:00 size has decreased to 3.7 x 1.3 x 2.6 cm.  Previously mass measured 2.8 x 2.4 x 2.6 cm  INTERVAL HISTORY Dawn Alvarez is a 65 y.o. female who has above history reviewed by me today presents for follow up visit for management of breast cancer, evaluation prior to chemotherapy. Problems and complaints are listed below: Patient reports tolerating chemotherapy well. 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  Patient is taking gabapentin 100 mg twice daily. Appetite is fair.  No fever, chills.   . Review of Systems  Constitutional: Negative for appetite change, chills, fatigue and fever.  HENT:   Negative for hearing loss and voice change.   Eyes: Negative for eye problems.  Respiratory: Negative for chest tightness and cough.   Cardiovascular: Negative for chest pain.  Gastrointestinal: Negative for abdominal distention, abdominal pain and blood in stool.  Endocrine: Negative for hot flashes.  Genitourinary: Negative for difficulty urinating and  frequency.   Musculoskeletal: Positive for arthralgias.  Skin: Negative for itching and rash.  Neurological: Positive for numbness. Negative for extremity weakness.  Hematological: Negative for adenopathy.  Psychiatric/Behavioral: Negative for confusion.    MEDICAL HISTORY:  Past Medical History:  Diagnosis Date  . Asthma   . Cancer ([place])    breast  . COPD exacerbation ([place]) 04/12/2016  . Diabetes mellitus without complication ([place])   . Hyperlipemia   . Hypertension   . Personal history of chemotherapy     SURGICAL HISTORY: Past Surgical History:  Procedure Laterality Date  . BREAST BIOPSY Right 07/05/2019   [place] bx venus marker, grade 3 invasive mammary carcinoma  . BREAST BIOPSY Right 07/05/2019   LN bx, hydromarker,PREDOMINANTLY BLOOD AND FIBROADIPOSE TISSUE, WITH SCANT LYMPHOID TISSUE PRESENT      . CESAREAN SECTION    . COLONOSCOPY WITH PROPOFOL N/A 09/11/2015   Procedure: COLONOSCOPY WITH PROPOFOL;  Surgeon: Lucilla Lame, MD;  Location: [facility] ENDOSCOPY;  Service: Endoscopy;  Laterality: N/A;  . IR IMAGING GUIDED PORT INSERTION  07/20/2019    SOCIAL HISTORY: Social History   Socioeconomic History  . Marital status: Married    Spouse name: Not on file  . Number of children: Not on file  . Years of education: Not on file  . Highest education level: Not on file  Occupational History  . Not on file  Tobacco Use  . Smoking status: Former Smoker    Packs/day: 1.00    Years: 2.00    Pack years: 2.00    Types: Cigarettes    Quit date: 05/19/2019    Years since quitting: 0.6  . Smokeless tobacco: Never Used  Substance and Sexual Activity  . Alcohol use: Not Currently  . Drug use: No  . Sexual activity: Never    Birth control/protection: Abstinence  Other Topics Concern  . Not on file  Social History Narrative  . Not on file   Social Determinants of Health   Financial Resource Strain:   . Difficulty of Paying Living Expenses:   Food Insecurity:   . Worried  About Charity fundraiser in the Last Year:   . Arboriculturist in the Last Year:   Transportation Needs:   . Film/video editor (Medical):   Marland Kitchen Lack of Transportation (Non-Medical):   Physical Activity:   . Days of Exercise per Week:   . Minutes of Exercise per Session:   Stress:   . Feeling of Stress :   Social Connections:   . Frequency of Communication with Friends and Family:   . Frequency of Social Gatherings with Friends and Family:   . Attends Religious Services:   . Active Member of Clubs or Organizations:   . Attends Archivist Meetings:   Marland Kitchen Marital Status:   Intimate Partner Violence:   . Fear of Current or Ex-Partner:   . Emotionally Abused:   Marland Kitchen Physically Abused:   . Sexually Abused:     FAMILY HISTORY: Family History  Problem Relation Age of Onset  . Diabetes Mother   . Hypertension Mother   . Hypertension Father   . Diabetes Father     ALLERGIES:  has No Known Allergies.  MEDICATIONS:  Current Outpatient Medications  Medication Sig Dispense Refill  . Alcohol Swabs (B-D SINGLE USE SWABS REGULAR) PADS Use as  directed twice a day 300 each 3  . amLODipine (NORVASC) 10 MG tablet Take 1/2 tablet po QD 45 tablet 3  . bisacodyl (DULCOLAX) 5 MG EC tablet Take 5 mg by mouth daily as needed for moderate constipation.    . Blood Glucose Monitoring Suppl (TRUE METRIX AIR GLUCOSE METER) w/Device KIT 1 Device by Does not apply route in the morning and at bedtime. Use ad directed e11.65 1 kit 0  . brimonidine (ALPHAGAN) 0.2 % ophthalmic solution Place 1 drop into both eyes 2 (two) times daily.    . Calcium Carb-Cholecalciferol (CALCIUM 600 + D) 600-200 MG-UNIT TABS Take 1 tablet by mouth daily.    Marland Kitchen gabapentin (NEURONTIN) 100 MG capsule Take 1 capsule (100 mg total) by mouth 2 (two) times daily. 180 capsule 3  . glucose blood (TRUE METRIX BLOOD GLUCOSE TEST) test strip Use as instructed twice a daily diag e11.65 100 each 3  . insulin detemir (LEVEMIR  FLEXTOUCH) 100 UNIT/ML FlexPen Inject 44 Units into the skin daily. 15 mL 3  . Insulin Pen Needle (PEN NEEDLES) 31G X 8 MM MISC Use as directed with insulin E11.65 100 each 1  . Lancets Misc. (ACCU-CHEK MULTICLIX LANCET DEV) KIT by Does not apply route. Use as directed twice a day diag E11.65    . lidocaine-prilocaine (EMLA) cream     . lisinopril (ZESTRIL) 20 MG tablet Take 1 tablet (20 mg total) by mouth daily. 90 tablet 3  . Omega-3 Fatty Acids (FISH OIL) 1000 MG CAPS Take 1,000 mg by mouth daily.     . potassium chloride (KLOR-CON) 10 MEQ tablet Take 1 tablet (10 mEq total) by mouth daily. 30 tablet 0  . prochlorperazine (COMPAZINE) 10 MG tablet Take 1 tablet (10 mg total) by mouth every 6 (six) hours as needed for nausea or vomiting. 60 tablet 1  . Semaglutide, 1 MG/DOSE, (OZEMPIC, 1 MG/DOSE,) 2 MG/1.5ML SOPN Inject 1 mg into the skin once a week. 5 pen 3  . SIMBRINZA 1-0.2 % SUSP     . TRUEplus Lancets 30G MISC Use as directed twice daily diag e11.65 100 each 3  . nitrofurantoin, macrocrystal-monohydrate, (MACROBID) 100 MG capsule Take 1 capsule (100 mg total) by mouth 2 (two) times daily. (Patient not taking: Reported on 01/02/2020) 14 capsule 0   No current facility-administered medications for this visit.   Facility-Administered Medications Ordered in Other Visits  Medication Dose Route Frequency Provider Last Rate Last Admin  . sodium chloride flush (NS) 0.9 % injection 10 mL  10 mL Intravenous PRN Earlie Server, MD   10 mL at 10/11/19 0817     PHYSICAL EXAMINATION: ECOG PERFORMANCE STATUS: 1 - Symptomatic but completely ambulatory Vitals:   01/02/20 0905  BP: (!) 151/89  Pulse: 85  Resp: 18  Temp: (!) 96.8 F (36 C)   Filed Weights   01/02/20 0905  Weight: 165 lb 4.8 oz (75 kg)    Physical Exam Constitutional:      General: She is not in acute distress. HENT:     Head: Normocephalic and atraumatic.  Eyes:     General: No scleral icterus. Cardiovascular:     Rate and  Rhythm: Normal rate and regular rhythm.     Heart sounds: Normal heart sounds.  Pulmonary:     Effort: Pulmonary effort is normal. No respiratory distress.     Breath sounds: Normal breath sounds. No wheezing.  Abdominal:     General: Bowel sounds are normal. There is no  distension.     Palpations: Abdomen is soft.  Musculoskeletal:        General: No deformity. Normal range of motion.     Cervical back: Normal range of motion and neck supple.  Skin:    General: Skin is warm and dry.     Findings: No erythema or rash.  Neurological:     Mental Status: She is alert and oriented to person, place, and time. Mental status is at baseline.     Cranial Nerves: No cranial nerve deficit.     Coordination: Coordination normal.  Psychiatric:        Mood and Affect: Mood normal.      LABORATORY DATA:  I have reviewed the data as listed Lab Results  Component Value Date   WBC 4.7 01/02/2020   HGB 9.9 (L) 01/02/2020   HCT 29.6 (L) 01/02/2020   MCV 91.6 01/02/2020   PLT 286 01/02/2020   Recent Labs    12/16/19 0841 12/23/19 0828 01/02/20 0848  NA 136 138 136  K 3.9 3.8 3.6  CL 103 106 106  CO2 '26 25 22  ' GLUCOSE 154* 162* 190*  BUN '15 12 13  ' CREATININE 0.82 0.70 0.82  CALCIUM 9.2 9.1 8.7*  GFRNONAA >60 >60 >60  GFRAA >60 >60 >60  PROT 6.8 6.9 6.6  ALBUMIN 3.7 3.6 3.6  AST '20 19 19  ' ALT '17 16 15  ' ALKPHOS 33* 31* 39  BILITOT 0.4 0.5 0.4   Iron/TIBC/Ferritin/ %Sat No results found for: IRON, TIBC, FERRITIN, IRONPCTSAT    RADIOGRAPHIC STUDIES: I have personally reviewed the radiological images as listed and agreed with the findings in the report. DG HIP UNILAT WITH PELVIS MIN 4 VIEWS RIGHT  Result Date: 11/17/2019 CLINICAL DATA:  64 year old female with right hip pain. EXAM: DG HIP (WITH OR WITHOUT PELVIS) 4+V RIGHT COMPARISON:  CT of the abdomen pelvis dated 09/03/2019. FINDINGS: There is no acute fracture or dislocation. The bones are mildly osteopenic. There is mild  bilateral hip osteoarthritic changes. There is degenerative changes of the lower lumbar spine primarily at L4-L5 with endplate irregularity, disc degeneration and osteophyte. Calcified uterine fibroid noted over the pelvis. The soft tissues are unremarkable. Vascular calcifications noted. IMPRESSION: 1. No acute fracture or dislocation. 2. Mild bilateral hip osteoarthritic changes. Electronically Signed   By: Anner Crete M.D.   On: 11/17/2019 21:47      ASSESSMENT & PLAN:  1. Malignant neoplasm of upper-outer quadrant of right female breast, unspecified estrogen receptor status ([place])   2. Encounter for antineoplastic chemotherapy   3. Antineoplastic chemotherapy induced anemia   4. Neuropathy   5. Hypokalemia    cT2N0 grade 3 invasive mammary carcinoma.  ER negative, PR weakly positive (<=10%), HER-2 negative Status post 4 cycles of ddAC with growth factor support. Patient is currently on weekly Taxol.  Overall she tolerates chemotherapy. Labs are reviewed and discussed with patient. Proceed with cycle 12 Taxol treatment today.  Taxol is at 70 mg/m2 Obtain unilateral right diagnostic mammogram/ultrasound 10 to 14 days after last chemotherapy. Refer patient back to reestablish care with surgery and plan for surgery.  Discussed with breast cancer nurse navigator Sheena  #Chemotherapy-induced neuropathy, pre-existing diabetes. Continue gabapentin 100 mg twice daily.  Patient may increase to 3 times daily if symptom gets worse.  #Anemia, secondary to chemotherapy.  Hemoglobin stable.  Continue to monitor. #Chronic hypokalemia, potassium level is stable at 3.6.  Continue and finish current supply of potassium chloride 10 mEq  daily. Stop after that.   All questions were answered. The patient knows to call the clinic with any problems questions or concerns. Return of visit: I will see patient back 1 to 2 weeks after her surgery.  Earlie Server, MD, PhD Hematology Oncology [facility] Pager- [phone] 01/02/2020

## 2020-01-02 NOTE — Progress Notes (Signed)
Pt here for follow up. No new concerns voiced.   

## 2020-01-02 NOTE — Progress Notes (Signed)
Received message from Dr. Tasia Catchings to assist with scheduling patient for her breast surgery.  Patient is completing her neoadjuvant chemotherapy.  I have scheduled her to return to see Dr. Marlou Starks on 01/19/20 @ 10:30.  Spoke to patient and her husband with the appointment date and time.

## 2020-01-17 ENCOUNTER — Ambulatory Visit
Admission: RE | Admit: 2020-01-17 | Discharge: 2020-01-17 | Disposition: A | Discharge status: Home / Self Care | Payer: Medicare HMO | Source: Ambulatory Visit | Attending: Oncology | Admitting: Oncology

## 2020-01-17 ENCOUNTER — Other Ambulatory Visit: Payer: Self-pay

## 2020-01-17 DIAGNOSIS — R928 Other abnormal and inconclusive findings on diagnostic imaging of breast: Secondary | ICD-10-CM | POA: Diagnosis not present

## 2020-01-17 DIAGNOSIS — C50411 Malignant neoplasm of upper-outer quadrant of right female breast: Secondary | ICD-10-CM

## 2020-01-17 DIAGNOSIS — H40003 Preglaucoma, unspecified, bilateral: Secondary | ICD-10-CM | POA: Diagnosis not present

## 2020-01-17 DIAGNOSIS — N6489 Other specified disorders of breast: Secondary | ICD-10-CM | POA: Diagnosis not present

## 2020-01-18 ENCOUNTER — Telehealth: Payer: Self-pay | Admitting: *Deleted

## 2020-01-18 NOTE — Telephone Encounter (Signed)
T/C made to patient Dawn Alvarez regarding her missed appointment for week 12 assessment for the SWOG S1714 study. Patient was supposed to come to the [facility] following her mammogram and U/S appointments yesterday afternoon, but she never arrived. Patient states she forgot the appointment. She was contacted by this RN on afternoon of 01/16/20 to remind her of the appointment. Plans made for patient to come to clinic in the morning at 9:30 for her week 12 assessment prior to her scheduled PCP appointment. Patient's husband was also on the call and states he will make sure she gets her tomorrow. Yolande Jolly, BSN, MHA, OCN 01/18/2020 2:20 PM

## 2020-01-19 ENCOUNTER — Encounter: Payer: Self-pay | Admitting: *Deleted

## 2020-01-19 DIAGNOSIS — C50411 Malignant neoplasm of upper-outer quadrant of right female breast: Secondary | ICD-10-CM

## 2020-01-19 NOTE — Research (Signed)
Patient Dawn Alvarez presents to clinic with her husband this morning for week 12 assessment for the [facility] S1714 research study. Patient reports she is experiencing numbness and tingling in her fingers down into her hand now and in her toes and feet bilaterally - mostly numbness, but some tingling as well and the feeling of needles sticking her in her feet. States she experiences the numbness and tingling constantly now, worse at times, and with some mid to moderate pain at times now, and this had started at some point toward the end of her chemotherapy treatments. Solicited Neuropathy symptoms evaluated and patient denies dysesthesia, paresthesia or any motor neuropathy symptoms. She does report some neuralgia now, experiencing occasional pain along with her numbness and tingling, especially in her hands. Patient has been taking Gabapentin for almost 8 weeks now for her neuropathy symptoms and reports the symptoms  improved with the Gabapentin, at first, but have now worsened. She does report they are not quite as bad as when she was actively receiving the Taxol infusions. She denies using any acupuncture, cold or compression gloves or socks, massage therapy or any other medication thatn the gabapentin for her neuropathy symptoms. After verifying that the patient is left-side dominant, the Neuropen and [place] assessments were completed by this RN with assistance of Jeral Fruit, Therapist, sports. Patient also completed the PROMIS-29, GSLTPAQ, EORTC QLQ-CIPN20, PRO-CTCAE, Patient Reported Symptom Burden Questionnaire and the week 12 [id number]-4. At the patient's preference, and due to her report that she does not see well to read small print, all study-related questionnaires were read out loud and patient responses were recorded by this RN. Patient denies having any glasses for reading, but did go to see her eye doctor about this yesterday. Dr. Tasia Catchings completed the Physician's Follow up Treatment Burden question and  reviewed the week 12 CTCAE (neuropathy) Assessment, assigning grades and attribution to the patient's solicited neuropathy symptoms. Solicited AEs with grade and attribution are as noted below:   Adverse Event Log  Study/Protocol: SWOG S5681 Cycle: Week 12 solicited AEs Event Grade Onset Date Resolved Date Drug Name Attribution Treatment Comments  Dysesthesia 0        Neuralgia 1 12/02/2019  Taxol Definite gabapentin   Paresthesia 0        Peripheral Motor neuropathy 0        Peripheral Sensory neuropathy 2 12/02/2019  Taxol Definite gabapentin Reports worse since last chemo           Yolande Jolly, BSN, MHA, OCN 01/19/2020 10:19 AM

## 2020-02-02 ENCOUNTER — Encounter: Payer: Self-pay | Admitting: *Deleted

## 2020-02-02 NOTE — Progress Notes (Signed)
Received message from Janeann Merl, RN is regards to patient's surgery date.  I called Dr. Ethlyn Gallery office to inquire and patient had no-showed for her scheduled appointment in August and has not rescheduled.  I have left the patient a message to return my call.

## 2020-02-03 ENCOUNTER — Encounter: Payer: Self-pay | Admitting: *Deleted

## 2020-02-03 NOTE — Progress Notes (Signed)
I was able to contact patient and she states she forgot about appointment. Since she has been hard to reach, I have provided her with Dr. Ethlyn Gallery office number to call and reschedule.

## 2020-02-03 NOTE — Progress Notes (Signed)
Called patient and her daughter Virgilio Belling, who is her emergency contact. Left a message for them to return my call.  I need to rescheduled to see her surgeon.  She no-showed her appointment.

## 2020-02-09 ENCOUNTER — Telehealth: Payer: Self-pay

## 2020-02-09 NOTE — Telephone Encounter (Signed)
[place] surgery in regards to missed appt with Dr. Marlou Starks. Patient has rescheduled and appt is on 9/30.

## 2020-03-01 DIAGNOSIS — C50411 Malignant neoplasm of upper-outer quadrant of right female breast: Secondary | ICD-10-CM | POA: Diagnosis not present

## 2020-03-01 DIAGNOSIS — Z171 Estrogen receptor negative status [ER-]: Secondary | ICD-10-CM | POA: Diagnosis not present

## 2020-03-02 ENCOUNTER — Telehealth: Payer: Self-pay | Admitting: *Deleted

## 2020-03-02 DIAGNOSIS — C801 Malignant (primary) neoplasm, unspecified: Secondary | ICD-10-CM

## 2020-03-02 HISTORY — DX: Malignant (primary) neoplasm, unspecified: C80.1

## 2020-03-02 NOTE — Telephone Encounter (Signed)
T/C made to patient's daughter Virgilio Belling to determine whether Ms. Mullin kept her appointment with her surgeon, Dr. Marlou Starks yesterday. Virgilio Belling states patient did go and see Dr. Marlou Starks and that she is now waiting for them to contact her with a date for her breast surgery. Patient thought they would remove her port-a-cath yesterday, but they did not want to take it out yet. I informed LaTonya that I'll be retiring effective next week and that Jeral Fruit will be taking over as her Programme researcher, broadcasting/film/video. She will inform Ms. Mungo of this. Virgilio Belling also had questions about when it would be appropriate for her mother to get a Covid-19 vaccine. Referred her back to her physician about this due to not yet knowing when the surgery will be, and whether the patient needs to receive the vaccine prior to this.  Yolande Jolly, BSN, MHA, OCN 03/02/2020 10:50 AM

## 2020-03-07 ENCOUNTER — Encounter: Payer: Self-pay | Admitting: *Deleted

## 2020-03-07 ENCOUNTER — Telehealth: Payer: Self-pay | Admitting: *Deleted

## 2020-03-07 NOTE — Telephone Encounter (Signed)
FYI

## 2020-03-07 NOTE — Telephone Encounter (Signed)
Spoke with patient's daughter Dawn Alvarez regarding Dawn Alvarez's surgery. Dawn Alvarez states they are still waiting to hear when her surgery will be scheduled, but do not yet have a date. Informed daughter that definitive surgery is usually performed within 3-4 weeks of patients completing chemotherapy and Dawn Alvarez's last chemotherapy infusion was on 01/02/2020. Daughter is aware of this, but states the patient saw Dr. Marlou Starks last Thursday and they have not heard anything back from his office yet. Yolande Jolly, BSN, MHA, OCN 03/07/2020 2:16 PM

## 2020-03-07 NOTE — Telephone Encounter (Signed)
Put a note in Epic.  Called the surgeons office.  No orders for surgery, but orders for an breast MRI. Office does not know if it's been scheduled.

## 2020-03-07 NOTE — Telephone Encounter (Signed)
Patient originally scheduled with Dr. Marlou Starks on 8/19 and missed appt because she forgot. The appt on 9/30 is the rescheduled missed appt.

## 2020-03-07 NOTE — Progress Notes (Signed)
Called Dr. Ethlyn Gallery office to see if patient's surgery has been scheduled.  Office states that there is no order for for surgery, but an order has been placed for an MRI of the breast.  Office is not aware if that has been scheduled.  Will cc Dr. Tasia Catchings.

## 2020-03-08 NOTE — Telephone Encounter (Addendum)
Dr. Tasia Catchings, does pt need to be scheduled for an MRI? I don't see an order for MRI in.

## 2020-03-09 ENCOUNTER — Other Ambulatory Visit: Payer: Self-pay

## 2020-03-09 DIAGNOSIS — N3000 Acute cystitis without hematuria: Secondary | ICD-10-CM

## 2020-03-09 DIAGNOSIS — E1165 Type 2 diabetes mellitus with hyperglycemia: Secondary | ICD-10-CM

## 2020-03-09 DIAGNOSIS — Z794 Long term (current) use of insulin: Secondary | ICD-10-CM

## 2020-03-09 MED ORDER — GABAPENTIN 100 MG PO CAPS: 100.0000 mg | ORAL_CAPSULE | Freq: Two times a day (BID) | ORAL | 3 refills | Status: DC

## 2020-03-09 MED ORDER — POTASSIUM CHLORIDE ER 10 MEQ PO TBCR: 10.0000 meq | EXTENDED_RELEASE_TABLET | Freq: Every day | ORAL | 0 refills | Status: DC

## 2020-03-12 ENCOUNTER — Other Ambulatory Visit: Payer: Self-pay | Admitting: General Surgery

## 2020-03-12 ENCOUNTER — Other Ambulatory Visit: Payer: Self-pay

## 2020-03-12 DIAGNOSIS — C50411 Malignant neoplasm of upper-outer quadrant of right female breast: Secondary | ICD-10-CM

## 2020-03-12 DIAGNOSIS — Z171 Estrogen receptor negative status [ER-]: Secondary | ICD-10-CM

## 2020-03-12 DIAGNOSIS — I10 Essential (primary) hypertension: Secondary | ICD-10-CM

## 2020-03-12 MED ORDER — LISINOPRIL 20 MG PO TABS: 20.0000 mg | ORAL_TABLET | Freq: Every day | ORAL | 3 refills | Status: DC

## 2020-03-12 NOTE — Telephone Encounter (Signed)
Please schedule patient to see Dr. Tasia Catchings and call her with appt details.

## 2020-03-12 NOTE — Telephone Encounter (Signed)
Please arrange her to see me . MD only. Thanks.

## 2020-03-12 NOTE — Telephone Encounter (Signed)
Order for Breast MRI forwarded to [facility] from Centralized scheduling. VM on mobile # to pt to schd breast MRI. Home # was busy, tried multiple times to call home #

## 2020-03-12 NOTE — Telephone Encounter (Signed)
Done.. Appt has been sched as requested  A detailed message was left on pt VM making her aware of the  Sched date and time

## 2020-03-12 NOTE — Telephone Encounter (Signed)
Patient came by office today requesting an appointment with Dr. Tasia Catchings and left the office without giving more information.  It is documented that she was to be scheduled for breast MRI by [facility].  Message sent to breast navigators [place] if they knew an update for the surgeon's office.  Anne called [facility] and they have not gotten the MRI scheduled but they will check on getting this scheduled.  Webb Silversmith called the patient to inform her that she should be getting a call from surgeon office.    I also called patient to let her know of the above as well and to see if she was needing to see Dr. Tasia Catchings for something other than discussing surgeon f/u.  Advised her to call triage line if she is having any other issues or concerns that need to be evaluated by Dr. Tasia Catchings.

## 2020-03-15 ENCOUNTER — Inpatient Hospital Stay: Payer: Medicare HMO | Admitting: Oncology

## 2020-03-15 ENCOUNTER — Encounter: Payer: Self-pay | Admitting: *Deleted

## 2020-03-15 NOTE — Progress Notes (Addendum)
Patient missed her appointment with Dr. Tasia Catchings today.  I have called and left her and her daughter Dawn Alvarez a message to return my call.  I would like to assist with scheduling her breast MRI and follow up with Dr. Tasia Catchings.  Patient's surgery is still pending.  Spoke to patient's daughter Dawn Alvarez.  She is aware of her mom's appointment to follow up with Dr. Tasia Catchings on 03/22/20 @ 8:45.  Discussed need for the breast MRI and that the breast center cannot get in touch with her mom.  I will call Dawn Alvarez back with an appointment for the MRI.  Dawn Alvarez informed of patient's appointment for her MRI tomorrow, 03/16/20 @ 11:45.

## 2020-03-16 ENCOUNTER — Other Ambulatory Visit: Payer: Self-pay

## 2020-03-16 ENCOUNTER — Ambulatory Visit
Admission: RE | Admit: 2020-03-16 | Discharge: 2020-03-16 | Disposition: A | Discharge status: Home / Self Care | Payer: Medicare HMO | Source: Ambulatory Visit | Attending: General Surgery | Admitting: General Surgery

## 2020-03-16 DIAGNOSIS — C50911 Malignant neoplasm of unspecified site of right female breast: Secondary | ICD-10-CM | POA: Diagnosis not present

## 2020-03-16 DIAGNOSIS — C50411 Malignant neoplasm of upper-outer quadrant of right female breast: Secondary | ICD-10-CM | POA: Insufficient documentation

## 2020-03-16 MED ORDER — GADOBUTROL 1 MMOL/ML IV SOLN
7.0000 mL | Freq: Once | INTRAVENOUS | Status: AC | PRN
  Administered 2020-03-16: 7 mL via INTRAVENOUS

## 2020-03-22 ENCOUNTER — Inpatient Hospital Stay: Payer: Medicare HMO

## 2020-03-22 ENCOUNTER — Encounter: Payer: Self-pay | Admitting: Oncology

## 2020-03-22 ENCOUNTER — Inpatient Hospital Stay: Payer: Medicare HMO | Attending: Oncology | Admitting: Oncology

## 2020-03-22 ENCOUNTER — Other Ambulatory Visit: Payer: Self-pay

## 2020-03-22 VITALS — BP 168/91 | HR 68 | Temp 95.9°F | Resp 18 | Wt 166.4 lb

## 2020-03-22 DIAGNOSIS — Z171 Estrogen receptor negative status [ER-]: Secondary | ICD-10-CM | POA: Diagnosis not present

## 2020-03-22 DIAGNOSIS — G620 Drug-induced polyneuropathy: Secondary | ICD-10-CM

## 2020-03-22 DIAGNOSIS — D6481 Anemia due to antineoplastic chemotherapy: Secondary | ICD-10-CM

## 2020-03-22 DIAGNOSIS — C50411 Malignant neoplasm of upper-outer quadrant of right female breast: Secondary | ICD-10-CM

## 2020-03-22 DIAGNOSIS — Z006 Encounter for examination for normal comparison and control in clinical research program: Secondary | ICD-10-CM

## 2020-03-22 DIAGNOSIS — Z79899 Other long term (current) drug therapy: Secondary | ICD-10-CM | POA: Diagnosis not present

## 2020-03-22 DIAGNOSIS — Z95828 Presence of other vascular implants and grafts: Secondary | ICD-10-CM

## 2020-03-22 DIAGNOSIS — T451X5A Adverse effect of antineoplastic and immunosuppressive drugs, initial encounter: Secondary | ICD-10-CM | POA: Diagnosis not present

## 2020-03-22 DIAGNOSIS — G629 Polyneuropathy, unspecified: Secondary | ICD-10-CM

## 2020-03-22 LAB — COMPREHENSIVE METABOLIC PANEL
ALT: 15 U/L (ref 0–44)
AST: 18 U/L (ref 15–41)
Albumin: 3.8 g/dL (ref 3.5–5.0)
Alkaline Phosphatase: 28 U/L — ABNORMAL LOW (ref 38–126)
Anion gap: 8 (ref 5–15)
BUN: 15 mg/dL (ref 8–23)
CO2: 26 mmol/L (ref 22–32)
Calcium: 9.4 mg/dL (ref 8.9–10.3)
Chloride: 105 mmol/L (ref 98–111)
Creatinine, Ser: 0.76 mg/dL (ref 0.44–1.00)
GFR, Estimated: >60 mL/min (ref >60)
Glucose, Bld: 79 mg/dL (ref 70–99)
Potassium: 4 mmol/L (ref 3.5–5.1)
Sodium: 139 mmol/L (ref 135–145)
Total Bilirubin: 0.5 mg/dL (ref 0.3–1.2)
Total Protein: 7.2 g/dL (ref 6.5–8.1)

## 2020-03-22 LAB — CBC
HCT: 33 % — ABNORMAL LOW (ref 36.0–46.0)
Hemoglobin: 11.3 g/dL — ABNORMAL LOW (ref 12.0–15.0)
MCH: 28.8 pg (ref 26.0–34.0)
MCHC: 34.2 g/dL (ref 30.0–36.0)
MCV: 84 fL (ref 80.0–100.0)
Platelets: 241 K/uL (ref 150–400)
RBC: 3.93 MIL/uL (ref 3.87–5.11)
RDW: 14.9 % (ref 11.5–15.5)
WBC: 7.2 K/uL (ref 4.0–10.5)
nRBC: 0 % (ref 0.0–0.2)

## 2020-03-22 MED ORDER — LIDOCAINE-PRILOCAINE 2.5-2.5 % EX CREA: TOPICAL_CREAM | Freq: Once | CUTANEOUS | 2 refills | Status: AC

## 2020-03-22 MED ORDER — SODIUM CHLORIDE 0.9% FLUSH
10.0000 mL | Freq: Once | INTRAVENOUS | Status: AC
  Administered 2020-03-22: 10 mL via INTRAVENOUS
  Filled 2020-03-22: qty 10

## 2020-03-22 MED ORDER — HEPARIN SOD (PORK) LOCK FLUSH 100 UNIT/ML IV SOLN
500.0000 [IU] | Freq: Once | INTRAVENOUS | Status: AC
  Administered 2020-03-22: 500 [IU] via INTRAVENOUS
  Filled 2020-03-22: qty 5

## 2020-03-22 NOTE — Research (Signed)
Patient Gwendolynn Merkey presents to clinic with her husband this morning for week 24 assessment for the [facility] [id number] research study. Patient was ambulatory without any difficulties noted. Patient reports she is experiencing numbness and tingling in her fingers down into her hand now and in her toes and feet bilaterally - mostly numbness, but some tingling as well and the feeling of needles sticking her in her feet. States she experiences the numbness and tingling constantly but denies any pain currently. Solicited Neuropathy symptoms evaluated and patient denies dysesthesia, paresthesia or any motor neuropathy symptoms.  Patient has been taking Gabapentin for almost 12 weeks now for her neuropathy symptoms and reports the symptoms  improved with the Gabapentin, at first, but have continued to stay the same now. Dr. Tasia Catchings offered to increase the gabapentin to three times daily for the patient to decrease her symptoms and the patient elected to increase.  She does report they are not quite as bad as when she was actively receiving the Taxol infusions. She denies using any acupuncture, cold or compression gloves or socks, massage therapy or any other medication than the gabapentin for her neuropathy symptoms. After verifying that the patient is left-side dominant, the Neuropen and [employer], and timed get up and go assessments were completed by this RN. Patient also completed the PROMIS-29, GSLTPAQ, EORTC QLQ-CIPN20, PRO-CTCAE, Patient Reported Symptom Burden Questionnaire and the week 24 FACT-GOG-NTX-4. At the patient's preference, and due to her report that she does not see well to read small print, all study-related questionnaires were read out loud and patient responses were recorded by this RN. Patient denies having any glasses for reading.  Dr. Tasia Catchings completed the Physician's Follow up Treatment Burden question and reviewed the week 24 CTCAE (neuropathy) Assessment, assigning grades and attribution to the patient's  solicited neuropathy symptoms. Solicited AEs with grade and attribution are as noted below:   Adverse Event Log  Study/Protocol: SWOG [id number] Cycle: Week 24 Solicited AEs Event Grade Onset Date Resolved Date Drug Name Attribution Treatment Comments  Dysesthesia 1 03/22/2020  Taxol Definite gabapentin Increase gabapentin to three times daily  Neuralgia 0 12/02/2019 03/22/2020 Taxol Definite gabapentin   Paresthesia 1 03/22/2020  Taxol Definite gabapentin ncrease gabapentin to three times daily  Peripheral Motor neuropathy 0        Peripheral Sensory neuropathy 1 12/02/2019  Taxol Definite gabapentin Improved some with gabapentin           Jeral Fruit 03/22/2020 9:46 AM

## 2020-03-22 NOTE — Progress Notes (Signed)
Patient here for oncology follow-up appointment, expresses concerns of chronic numbness, elevated BP, and question about port removal.

## 2020-03-22 NOTE — Progress Notes (Signed)
Hematology/Oncology follow up note [facility] [phone] [phone]   Patient Care Team: Lavera Guise, MD as PCP - General (Internal Medicine) Kate Sable, MD as PCP - Cardiology (Cardiology) Rico Junker, RN as Registered Nurse Theodore Demark, RN as Registered Nurse Earlie Server, MD as Consulting Physician (Oncology) Rico Junker, RN as Registered Nurse  REFERRING PROVIDER: Lavera Guise, MD  CHIEF COMPLAINTS/REASON FOR VISIT:  Follow-up for breast cancer  HISTORY OF PRESENTING ILLNESS:   Dawn Alvarez is a  64 y.o.  female with PMH listed below was seen in consultation at the request of  Lavera Guise, MD  for evaluation of breast cancer Patient had screening mammogram done 08/12/2017 which showed right breast asymmetry.  A diagnostic mammogram was suggested and patient did not have it done. Patient felt her right breast mass for a few weeks.  She had bilateral diagnostic mammogram done on 06/30/2019. 2.8 x 2.4 x 2.6 cm right breast mass, 11:00, 10 cm from the nipple.  There is a single mildly abnormal node in the right axilla with a cortex measuring up to 4.4 mm.  No other suspicious findings. Patient underwent ultrasound-guided core biopsy of the right breast mass and right axilla lymph node Pathology showed invasive mammary carcinoma, no special type, grade 3, ER/PR HER-2 status are pending. Right axillary lymph node biopsy showed predominantly blood in the fibroadipose tissue, with scant lymphoid tissue present.  No definite malignancy was identified.  Patient was referred to cancer center to establish care and discuss treatment plan. Menarche 4 Postmenopausal.  LMP when she was 64 years old. She recalls use of birth control pills. Denies any hormone .  Replacement therapy. Denies any prior chest radiation. She reports family history of maternal grandmother and 2 maternal cousins were diagnosed with cancer.  She does  not know about details. # 09/03/2019, patient had an episode of syncope and EMS was called and patient sent to emergency room.  Patient was noted to have a heart rate 45-65 and blood glucose level of 286. Patient denies any history of nausea, vomiting, diarrhea.  Denies any dehydration.  She reported history of intermittent abdominal pain. CT head without contrast showed no acute intracranial abnormality.  CT abdomen pelvis with contrast is negative for evidence of acute abdominal abnormality.  Patient was given IV fluid. Patient was observed with holding her blood pressure medication including amlodipine, bisoprolol.  No arrhythmia was noted on telemetry.  Echocardiogram showed LVEF 60 to 65%.  No valvular abnormalities.  Beta-blocker and diuretics was discontinued.  # Patient was seen by cardiology Dr. Garen Lah for evaluation of syncope and was cleared for chemotherapy. # 10/03/2019, interval unilateral right diagnostic mammogram showed right breast mass 11:00 size has decreased to 3.7 x 1.3 x 2.6 cm.  Previously mass measured 2.8 x 2.4 x 2.6 cm  INTERVAL HISTORY Dawn Alvarez is a 64 y.o. female who has above history reviewed by me today presents for follow up visit for management of breast cancer. Problems and complaints are listed below: Patient reports tolerating chemotherapy well. Patient was seen by me last on 01/02/2020.  She finished neoadjuvant chemotherapy and mammogram after chemotherapy showed decreased size of cancer at that time and was sent back to surgeon for surgery. Patient no showed to Dr. Ethlyn Gallery office in September 2021 Patient presents to reestablish care. 03/16/2020 bilateral breast MRI with and without contrast showed previously biopsied mass in the posterior aspect of the upper outer  quadrant of the right breast is no longer visualized.  There is a small amount of low-grade linear enhancement extending posteriorly and superiorly from the location towards the right axilla.   Measuring 5.2 x 2.0 x 1.1 cm.  No evidence of malignancy elsewhere in either breast.  No adenopathy.  For chemotherapy-induced neuropathy, on her fingertips and toes, mainly numbness, sometimes burning.  Patient is taking gabapentin 100 mg twice daily.   . Review of Systems  Constitutional: Negative for appetite change, chills, fatigue and fever.  HENT:   Negative for hearing loss and voice change.   Eyes: Negative for eye problems.  Respiratory: Negative for chest tightness and cough.   Cardiovascular: Negative for chest pain.  Gastrointestinal: Negative for abdominal distention, abdominal pain and blood in stool.  Endocrine: Negative for hot flashes.  Genitourinary: Negative for difficulty urinating and frequency.   Musculoskeletal: Positive for arthralgias.  Skin: Negative for itching and rash.  Neurological: Positive for numbness. Negative for extremity weakness.  Hematological: Negative for adenopathy.  Psychiatric/Behavioral: Negative for confusion.    MEDICAL HISTORY:  Past Medical History:  Diagnosis Date  . Asthma   . Cancer ([place])    breast  . COPD exacerbation ([place]) 04/12/2016  . Diabetes mellitus without complication ([place])   . Hyperlipemia   . Hypertension   . Personal history of chemotherapy 2021    SURGICAL HISTORY: Past Surgical History:  Procedure Laterality Date  . BREAST BIOPSY Right 07/05/2019   [place] bx venus marker, grade 3 invasive mammary carcinoma  . BREAST BIOPSY Right 07/05/2019   LN bx, hydromarker,PREDOMINANTLY BLOOD AND FIBROADIPOSE TISSUE, WITH SCANT LYMPHOID TISSUE PRESENT      . CESAREAN SECTION    . COLONOSCOPY WITH PROPOFOL N/A 09/11/2015   Procedure: COLONOSCOPY WITH PROPOFOL;  Surgeon: Lucilla Lame, MD;  Location: [facility] ENDOSCOPY;  Service: Endoscopy;  Laterality: N/A;  . IR IMAGING GUIDED PORT INSERTION  07/20/2019    SOCIAL HISTORY: Social History   Socioeconomic History  . Marital status: Married    Spouse name: Not on file  .  Number of children: Not on file  . Years of education: Not on file  . Highest education level: Not on file  Occupational History  . Not on file  Tobacco Use  . Smoking status: Former Smoker    Packs/day: 1.00    Years: 2.00    Pack years: 2.00    Types: Cigarettes    Quit date: 05/19/2019    Years since quitting: 0.8  . Smokeless tobacco: Never Used  Substance and Sexual Activity  . Alcohol use: Not Currently  . Drug use: No  . Sexual activity: Never    Birth control/protection: Abstinence  Other Topics Concern  . Not on file  Social History Narrative  . Not on file   Social Determinants of Health   Financial Resource Strain:   . Difficulty of Paying Living Expenses: Not on file  Food Insecurity:   . Worried About Charity fundraiser in the Last Year: Not on file  . Ran Out of Food in the Last Year: Not on file  Transportation Needs:   . Lack of Transportation (Medical): Not on file  . Lack of Transportation (Non-Medical): Not on file  Physical Activity:   . Days of Exercise per Week: Not on file  . Minutes of Exercise per Session: Not on file  Stress:   . Feeling of Stress : Not on file  Social Connections:   . Frequency of  Communication with Friends and Family: Not on file  . Frequency of Social Gatherings with Friends and Family: Not on file  . Attends Religious Services: Not on file  . Active Member of Clubs or Organizations: Not on file  . Attends Archivist Meetings: Not on file  . Marital Status: Not on file  Intimate Partner Violence:   . Fear of Current or Ex-Partner: Not on file  . Emotionally Abused: Not on file  . Physically Abused: Not on file  . Sexually Abused: Not on file    FAMILY HISTORY: Family History  Problem Relation Age of Onset  . Diabetes Mother   . Hypertension Mother   . Hypertension Father   . Diabetes Father   . Breast cancer Neg Hx     ALLERGIES:  has No Known Allergies.  MEDICATIONS:  Current Outpatient  Medications  Medication Sig Dispense Refill  . Alcohol Swabs (B-D SINGLE USE SWABS REGULAR) PADS Use as directed twice a day 300 each 3  . amLODipine (NORVASC) 10 MG tablet Take 1/2 tablet po QD 45 tablet 3  . Blood Glucose Monitoring Suppl (TRUE METRIX AIR GLUCOSE METER) w/Device KIT 1 Device by Does not apply route in the morning and at bedtime. Use ad directed e11.65 1 kit 0  . brimonidine (ALPHAGAN) 0.2 % ophthalmic solution Place 1 drop into both eyes 2 (two) times daily.    . Calcium Carb-Cholecalciferol (CALCIUM 600 + D) 600-200 MG-UNIT TABS Take 1 tablet by mouth daily.    Marland Kitchen gabapentin (NEURONTIN) 100 MG capsule Take 1 capsule (100 mg total) by mouth 2 (two) times daily. 180 capsule 3  . glucose blood (TRUE METRIX BLOOD GLUCOSE TEST) test strip Use as instructed twice a daily diag e11.65 100 each 3  . insulin detemir (LEVEMIR FLEXTOUCH) 100 UNIT/ML FlexPen Inject 44 Units into the skin daily. 15 mL 3  . Insulin Pen Needle (PEN NEEDLES) 31G X 8 MM MISC Use as directed with insulin E11.65 100 each 1  . Lancets Misc. (ACCU-CHEK MULTICLIX LANCET DEV) KIT by Does not apply route. Use as directed twice a day diag E11.65    . lidocaine-prilocaine (EMLA) cream     . lisinopril (ZESTRIL) 20 MG tablet Take 1 tablet (20 mg total) by mouth daily. 90 tablet 3  . Omega-3 Fatty Acids (FISH OIL) 1000 MG CAPS Take 1,000 mg by mouth daily.     . potassium chloride (KLOR-CON) 10 MEQ tablet Take 1 tablet (10 mEq total) by mouth daily. 30 tablet 0  . prochlorperazine (COMPAZINE) 10 MG tablet Take 1 tablet (10 mg total) by mouth every 6 (six) hours as needed for nausea or vomiting. 60 tablet 1  . Semaglutide, 1 MG/DOSE, (OZEMPIC, 1 MG/DOSE,) 2 MG/1.5ML SOPN Inject 1 mg into the skin once a week. 5 pen 3  . SIMBRINZA 1-0.2 % SUSP     . TRUEplus Lancets 30G MISC Use as directed twice daily diag e11.65 100 each 3  . bisacodyl (DULCOLAX) 5 MG EC tablet Take 5 mg by mouth daily as needed for moderate constipation.  (Patient not taking: Reported on 03/22/2020)    . nitrofurantoin, macrocrystal-monohydrate, (MACROBID) 100 MG capsule Take 1 capsule (100 mg total) by mouth 2 (two) times daily. (Patient not taking: Reported on 01/02/2020) 14 capsule 0   No current facility-administered medications for this visit.   Facility-Administered Medications Ordered in Other Visits  Medication Dose Route Frequency Provider Last Rate Last Admin  . sodium chloride flush (NS)  0.9 % injection 10 mL  10 mL Intravenous PRN Earlie Server, MD   10 mL at 10/11/19 0817     PHYSICAL EXAMINATION: ECOG PERFORMANCE STATUS: 1 - Symptomatic but completely ambulatory Vitals:   03/22/20 0854  BP: (!) 168/91  Pulse: 68  Resp: 18  Temp: (!) 95.9 F (35.5 C)  SpO2: 100%   Filed Weights   03/22/20 0854  Weight: 166 lb 6.4 oz (75.5 kg)    Physical Exam Constitutional:      General: She is not in acute distress. HENT:     Head: Normocephalic and atraumatic.  Eyes:     General: No scleral icterus. Cardiovascular:     Rate and Rhythm: Normal rate and regular rhythm.     Heart sounds: Normal heart sounds.  Pulmonary:     Effort: Pulmonary effort is normal. No respiratory distress.     Breath sounds: Normal breath sounds. No wheezing.  Abdominal:     General: Bowel sounds are normal. There is no distension.     Palpations: Abdomen is soft.  Musculoskeletal:        General: No deformity. Normal range of motion.     Cervical back: Normal range of motion and neck supple.  Skin:    General: Skin is warm and dry.     Findings: No erythema or rash.     Comments: Nail hyperpigmentation changes  Neurological:     Mental Status: She is alert and oriented to person, place, and time. Mental status is at baseline.     Cranial Nerves: No cranial nerve deficit.     Coordination: Coordination normal.  Psychiatric:        Mood and Affect: Mood normal.   Breast exam was performed in seated and lying down position. I do not appreciate  discrete mass in the right breast.  No palpable axillary lymphadenopathy.   LABORATORY DATA:  I have reviewed the data as listed Lab Results  Component Value Date   WBC 4.7 01/02/2020   HGB 9.9 (L) 01/02/2020   HCT 29.6 (L) 01/02/2020   MCV 91.6 01/02/2020   PLT 286 01/02/2020   Recent Labs    12/16/19 0841 12/23/19 0828 01/02/20 0848  NA 136 138 136  K 3.9 3.8 3.6  CL 103 106 106  CO2 _0 GLUCOSE 154* 162* 190*  BUN _1 CREATININE 0.82 0.70 0.82  CALCIUM 9.2 9.1 8.7*  GFRNONAA >60 >60 >60  GFRAA >60 >60 >60  PROT 6.8 6.9 6.6  ALBUMIN 3.7 3.6 3.6  AST _2 ALT _3 ALKPHOS 33* 31* 39  BILITOT 0.4 0.5 0.4   Iron/TIBC/Ferritin/ %Sat No results found for: IRON, TIBC, FERRITIN, IRONPCTSAT    RADIOGRAPHIC STUDIES: I have personally reviewed the radiological images as listed and agreed with the findings in the report. MR BREAST BILATERAL W WO CONTRAST INC CAD  Result Date: 03/16/2020 CLINICAL DATA:  Grade 3 invasive mammary carcinoma diagnosed ultrasound-guided core needle biopsy in the 10-11 o'clock position of the right breast on 07/05/2019. The patient has had subsequent neoadjuvant chemotherapy. On a follow-up right diagnostic mammogram and right breast ultrasound dated 01/17/2020, it was difficult to determine how much residual tumor there was. LABS:  None obtained on site today. EXAM: BILATERAL BREAST MRI WITH AND WITHOUT CONTRAST TECHNIQUE: Multiplanar, multisequence MR images of both breasts were obtained prior to and following the intravenous administration of 7 ml of Gadavist Three-dimensional MR images  were rendered by post-processing of the original MR data on an independent workstation. The three-dimensional MR images were interpreted, and findings are reported in the following complete MRI report for this study. Three dimensional images were evaluated at the independent interpreting workstation using the DynaCAD thin client. COMPARISON:   Previous mammogram, ultrasound and biopsy examinations. The patient has not had a previous MRI. FINDINGS: Breast composition: b. Scattered fibroglandular tissue. Background parenchymal enhancement: Minimal Right breast: Biopsy marker clip artifact in the posterior aspect of the upper-outer quadrant of the right breast at the location previously biopsied malignancy. There is a small amount of linear, ill-defined, low-grade enhancement extending from that area posteriorly and superiorly toward the right axilla. This area measures 5.2 x 1.1 cm on image number 66 series 16 and 2.0 cm in cephalocaudal dimension. This has persistent enhancement kinetics. No masses or enhancement suspicious for malignancy elsewhere in the right breast. Left breast: No mass or abnormal enhancement. Lymph nodes: No abnormal appearing lymph nodes. The previously biopsied right inferior axillary lymph node has a maximum cortical thickness of 4 mm. This measured 4.4 mm on the ultrasound dated 01/20/2020. This does not show any evidence of malignancy on the biopsy at that time. Ancillary findings:  None. IMPRESSION: 1. The previously biopsied mass in the posterior aspect of the upper-outer quadrant of the right breast is no longer visualized. There is a small amount of low-grade linear enhancement extending posteriorly and superiorly from that location toward the right axilla, measuring 5.2 x 2.0 x 1.1 cm. 2. No evidence of malignancy elsewhere in either breast. 3. No adenopathy. RECOMMENDATION: Treatment plan. BI-RADS CATEGORY  6: Known biopsy-proven malignancy. Electronically Signed   By: Claudie Revering M.D.   On: 03/16/2020 13:24   US Breast Limited Uni Right Inc Axilla  Result Date: 01/17/2020 CLINICAL DATA:  64 year old female diagnosed with right breast cancer in February of 2021 presenting today for follow-up post neoadjuvant chemotherapy. EXAM: DIGITAL DIAGNOSTIC RIGHT MAMMOGRAM WITH CAD AND TOMO ULTRASOUND RIGHT BREAST COMPARISON:   Previous exam(s). ACR Breast Density Category b: There are scattered areas of fibroglandular density. FINDINGS: The mass in the upper-outer quadrant at the site of biopsy proven malignancy has significantly decreased in size and density, now with a residual asymmetry at the site of the venous shaped biopsy marking clip. No new suspicious calcifications, masses or areas of distortion are seen in the right breast. Mammographic images were processed with CAD. Ultrasound targeted to the right breast at 11 o'clock, 10 cm from the nipple demonstrates an ill-defined elongated hypoechoic area at the site of biopsy proven malignancy. The mass has clearly decreased in thickness, however is difficult to confidently determine where the mass begins and ends in the longitudinal plane. Previously, the mass measured 2.4 cm in thickness, today measuring 0.6 cm. Best estimate in the longitudinal plane today is 4.2 x 2.9 cm, previously 2.8 x 2.6 cm, however again due to the ill-defined margins these measurements may not be accurate. IMPRESSION: Mammographic and targeted sonographic evidence of decrease in bulk of the biopsy proven malignancy in the right breast at 11 o'clock. RECOMMENDATION: 1. Continue treatment plan. If a more precise estimate of residual tumor is desired, breast MRI is recommended. 2. The patient will be due for bilateral mammography in January of 2022. I have discussed the findings and recommendations with the patient. If applicable, a reminder letter will be sent to the patient regarding the next appointment. BI-RADS CATEGORY  6: Known biopsy-proven malignancy. Electronically Signed  By: Ammie Ferrier M.D.   On: 01/17/2020 15:08   MM DIAG BREAST TOMO UNI RIGHT  Result Date: 01/17/2020 CLINICAL DATA:  64 year old female diagnosed with right breast cancer in February of 2021 presenting today for follow-up post neoadjuvant chemotherapy. EXAM: DIGITAL DIAGNOSTIC RIGHT MAMMOGRAM WITH CAD AND TOMO ULTRASOUND  RIGHT BREAST COMPARISON:  Previous exam(s). ACR Breast Density Category b: There are scattered areas of fibroglandular density. FINDINGS: The mass in the upper-outer quadrant at the site of biopsy proven malignancy has significantly decreased in size and density, now with a residual asymmetry at the site of the venous shaped biopsy marking clip. No new suspicious calcifications, masses or areas of distortion are seen in the right breast. Mammographic images were processed with CAD. Ultrasound targeted to the right breast at 11 o'clock, 10 cm from the nipple demonstrates an ill-defined elongated hypoechoic area at the site of biopsy proven malignancy. The mass has clearly decreased in thickness, however is difficult to confidently determine where the mass begins and ends in the longitudinal plane. Previously, the mass measured 2.4 cm in thickness, today measuring 0.6 cm. Best estimate in the longitudinal plane today is 4.2 x 2.9 cm, previously 2.8 x 2.6 cm, however again due to the ill-defined margins these measurements may not be accurate. IMPRESSION: Mammographic and targeted sonographic evidence of decrease in bulk of the biopsy proven malignancy in the right breast at 11 o'clock. RECOMMENDATION: 1. Continue treatment plan. If a more precise estimate of residual tumor is desired, breast MRI is recommended. 2. The patient will be due for bilateral mammography in January of 2022. I have discussed the findings and recommendations with the patient. If applicable, a reminder letter will be sent to the patient regarding the next appointment. BI-RADS CATEGORY  6: Known biopsy-proven malignancy. Electronically Signed   By: Ammie Ferrier M.D.   On: 01/17/2020 15:08      ASSESSMENT & PLAN:  1. Malignant neoplasm of upper-outer quadrant of right breast in female, estrogen receptor negative ([place])   2. Neuropathy   3. Port-A-Cath in place    cT2N0 grade 3 invasive mammary carcinoma.  ER negative, PR weakly  positive (<=10%), HER-2 negative Status post 4 cycles of ddAC with growth factor support, and 12 weekly treatments of Taxol. Postchemotherapy mammogram showed decrease of cancer size. Patient was recommended to see Dr. Marlou Starks for discussion of surgery. Per note, patient no showed to Dr. Marlou Starks appointment in September.  It was plan for MRI breast for further evaluation which was not done until this week. MRI breast images were independently reviewed by me and discussed with patient.  Previously biopsied mass was no longer visualized.  There is an area of low-grade enhancement. I discussed with patient and her husband that it is very important to proceed with the surgery.  I encourage patient to get an appointment with Dr. Marlou Starks ASAP for further discussion.  #Chemotherapy induced neuropathy, grade 1-2.   She takes gabapentin 100 mg twice daily.  And I recommend patient to go up on the gabapentin to 3 times daily.  #Port-A-Cath  I recommend patient to keep Mediport for about a year at least. Recommend port flush every 8 weeks.  We will see if she can get a port flush today. Check CBC CMP.   All questions were answered. The patient knows to call the clinic with any problems questions or concerns. Return of visit: I discussed with nurse navigator Vita Barley has called Dr. Marlou Starks office and will follow up tomorrow  to arrange an appointment for patient.  Earlie Server, MD, PhD Hematology Oncology [facility] at [facility] Pager- [phone] 03/22/2020

## 2020-03-23 ENCOUNTER — Ambulatory Visit: Payer: Self-pay | Admitting: General Surgery

## 2020-03-23 DIAGNOSIS — C50411 Malignant neoplasm of upper-outer quadrant of right female breast: Secondary | ICD-10-CM

## 2020-03-27 ENCOUNTER — Encounter: Payer: Self-pay | Admitting: *Deleted

## 2020-03-27 NOTE — Progress Notes (Signed)
Called Dr. Ethlyn Gallery office last week and again on Friday.  I spoke with his CMA in regards to patient's surgery.  CMA stated that Dr. Marlou Starks had reviewed the MRI and can go straight to surgery.  His office was to contact patient with surgery date.  I called patient today, but her number went straight to voicemail which is not set-up.  I spoke to her daughter Virgilio Belling, and she will follow up with her mom to see if her surgery has been scheduled.  Virgilio Belling may also call [facility] to find out her surgery date.  They are to call with any questions or needs.

## 2020-04-03 ENCOUNTER — Encounter: Payer: Self-pay | Admitting: Nurse Practitioner

## 2020-04-03 ENCOUNTER — Ambulatory Visit (INDEPENDENT_AMBULATORY_CARE_PROVIDER_SITE_OTHER): Payer: Medicaid Other | Admitting: Internal Medicine

## 2020-04-03 ENCOUNTER — Other Ambulatory Visit: Payer: Self-pay

## 2020-04-03 ENCOUNTER — Telehealth: Payer: Self-pay

## 2020-04-03 DIAGNOSIS — Z794 Long term (current) use of insulin: Secondary | ICD-10-CM | POA: Diagnosis not present

## 2020-04-03 DIAGNOSIS — E782 Mixed hyperlipidemia: Secondary | ICD-10-CM | POA: Diagnosis not present

## 2020-04-03 DIAGNOSIS — C50411 Malignant neoplasm of upper-outer quadrant of right female breast: Secondary | ICD-10-CM | POA: Diagnosis not present

## 2020-04-03 DIAGNOSIS — Z23 Encounter for immunization: Secondary | ICD-10-CM | POA: Diagnosis not present

## 2020-04-03 DIAGNOSIS — E1165 Type 2 diabetes mellitus with hyperglycemia: Secondary | ICD-10-CM | POA: Diagnosis not present

## 2020-04-03 DIAGNOSIS — I700 Atherosclerosis of aorta: Secondary | ICD-10-CM | POA: Diagnosis not present

## 2020-04-03 DIAGNOSIS — I10 Essential (primary) hypertension: Secondary | ICD-10-CM

## 2020-04-03 DIAGNOSIS — F17200 Nicotine dependence, unspecified, uncomplicated: Secondary | ICD-10-CM | POA: Diagnosis not present

## 2020-04-03 DIAGNOSIS — Z171 Estrogen receptor negative status [ER-]: Secondary | ICD-10-CM | POA: Diagnosis not present

## 2020-04-03 LAB — POCT GLYCOSYLATED HEMOGLOBIN (HGB A1C): Hemoglobin A1C: 6.5 % — AB (ref 4.0–5.6)

## 2020-04-03 MED ORDER — LISINOPRIL 30 MG PO TABS: ORAL_TABLET | ORAL | 3 refills | Status: DC

## 2020-04-03 MED ORDER — LEVEMIR FLEXTOUCH 100 UNIT/ML ~~LOC~~ SOPN: 44.0000 [IU] | PEN_INJECTOR | Freq: Every day | SUBCUTANEOUS | 3 refills | Status: DC

## 2020-04-03 MED ORDER — OZEMPIC (1 MG/DOSE) 2 MG/1.5ML ~~LOC~~ SOPN: 1.0000 mg | PEN_INJECTOR | SUBCUTANEOUS | 6 refills | Status: DC

## 2020-04-03 MED ORDER — ATORVASTATIN CALCIUM 10 MG PO TABS: 10.0000 mg | ORAL_TABLET | Freq: Every day | ORAL | 3 refills | Status: DC

## 2020-04-03 NOTE — Progress Notes (Signed)
[facility] [place], [place] [address]  Internal MEDICINE  Office Visit Note  Patient Name: Dawn Alvarez  [id number]  [id number]  Date of Service: 04/03/2020  Chief Complaint  Patient presents with  . Follow-up  . Diabetes  . Hyperlipidemia  . Hypertension  . Quality Metric Gaps    flu,lipid panel,tetnaus    HPI Pt is here for routine follow up  Feels well, has been going through chemo and radiation treatment for cT2N0 grade 3 invasive mammary carcinoma.  ER negative, PR weakly positive (<=10%), HER-2 negative Status post 4 cycles of ddAC with growth factor support, and 12 weekly treatments of Taxol. Postchemotherapy mammogram showed decrease of cancer size. Patient was recommended to see Dr. Marlou Starks for discussion of surgery. Per note, patient no showed to Dr. Marlou Starks appointment in September.  It was plan for MRI breast for further evaluation which was not done until this week. MRI breast images were independently reviewed by me and discussed with patient.  Previously biopsied mass was no longer visualized.  There is an area of low-grade enhancement. Plan is to proceed with surgery, pt has been a no show for her appointment for surgical consultation  2. BP is not well controlled either, she admits to take all medications for bp  3. Diabetic control is better 4. Pt also has abnormal Lipid profile   Current Medication: Outpatient Encounter Medications as of 04/03/2020  Medication Sig  . Alcohol Swabs (B-D SINGLE USE SWABS REGULAR) PADS Use as directed twice a day  . amLODipine (NORVASC) 10 MG tablet Take 1/2 tablet po QD  . Blood Glucose Monitoring Suppl (TRUE METRIX AIR GLUCOSE METER) w/Device KIT 1 Device by Does not apply route in the morning and at bedtime. Use ad directed e11.65  . brimonidine (ALPHAGAN) 0.2 % ophthalmic solution Place 1 drop into both eyes 2 (two) times daily.  . Calcium Carb-Cholecalciferol (CALCIUM 600 + D) 600-200 MG-UNIT TABS  Take 1 tablet by mouth daily.  Marland Kitchen gabapentin (NEURONTIN) 100 MG capsule Take 1 capsule (100 mg total) by mouth 2 (two) times daily.  Marland Kitchen glucose blood (TRUE METRIX BLOOD GLUCOSE TEST) test strip Use as instructed twice a daily diag e11.65  . insulin detemir (LEVEMIR FLEXTOUCH) 100 UNIT/ML FlexPen Inject 44 Units into the skin daily. ALONG WITH NEEDLES  . Insulin Pen Needle (PEN NEEDLES) 31G X 8 MM MISC Use as directed with insulin E11.65  . Lancets Misc. (ACCU-CHEK MULTICLIX LANCET DEV) KIT by Does not apply route. Use as directed twice a day diag E11.65  . lisinopril (ZESTRIL) 30 MG tablet Take one tab po qd for HTN  . nitrofurantoin, macrocrystal-monohydrate, (MACROBID) 100 MG capsule Take 1 capsule (100 mg total) by mouth 2 (two) times daily.  . Omega-3 Fatty Acids (FISH OIL) 1000 MG CAPS Take 1,000 mg by mouth daily.   . potassium chloride (KLOR-CON) 10 MEQ tablet Take 1 tablet (10 mEq total) by mouth daily.  . prochlorperazine (COMPAZINE) 10 MG tablet Take 1 tablet (10 mg total) by mouth every 6 (six) hours as needed for nausea or vomiting.  . Semaglutide, 1 MG/DOSE, (OZEMPIC, 1 MG/DOSE,) 2 MG/1.5ML SOPN Inject 1 mg into the skin once a week. FOR DM  . SIMBRINZA 1-0.2 % SUSP   . TRUEplus Lancets 30G MISC Use as directed twice daily diag e11.65  . [DISCONTINUED] insulin detemir (LEVEMIR FLEXTOUCH) 100 UNIT/ML FlexPen Inject 44 Units into the skin daily.  . [DISCONTINUED] lisinopril (ZESTRIL) 20 MG tablet  Take 1 tablet (20 mg total) by mouth daily.  . [DISCONTINUED] Semaglutide, 1 MG/DOSE, (OZEMPIC, 1 MG/DOSE,) 2 MG/1.5ML SOPN Inject 1 mg into the skin once a week.  Marland Kitchen atorvastatin (LIPITOR) 10 MG tablet Take 1 tablet (10 mg total) by mouth daily.  . bisacodyl (DULCOLAX) 5 MG EC tablet Take 5 mg by mouth daily as needed for moderate constipation. (Patient not taking: Reported on 03/22/2020)   Facility-Administered Encounter Medications as of 04/03/2020  Medication  . sodium chloride flush (NS)  0.9 % injection 10 mL    Surgical History: Past Surgical History:  Procedure Laterality Date  . BREAST BIOPSY Right 07/05/2019   [place] bx venus marker, grade 3 invasive mammary carcinoma  . BREAST BIOPSY Right 07/05/2019   LN bx, hydromarker,PREDOMINANTLY BLOOD AND FIBROADIPOSE TISSUE, WITH SCANT LYMPHOID TISSUE PRESENT      . CESAREAN SECTION    . COLONOSCOPY WITH PROPOFOL N/A 09/11/2015   Procedure: COLONOSCOPY WITH PROPOFOL;  Surgeon: Lucilla Lame, MD;  Location: [facility] ENDOSCOPY;  Service: Endoscopy;  Laterality: N/A;  . IR IMAGING GUIDED PORT INSERTION  07/20/2019    Medical History: Past Medical History:  Diagnosis Date  . Asthma   . Cancer ([place])    breast  . COPD exacerbation ([place]) 04/12/2016  . Diabetes mellitus without complication ([place])   . Hyperlipemia   . Hypertension   . Personal history of chemotherapy 2021    Family History: Family History  Problem Relation Age of Onset  . Diabetes Mother   . Hypertension Mother   . Hypertension Father   . Diabetes Father   . Breast cancer Neg Hx     Social History   Socioeconomic History  . Marital status: Married    Spouse name: Not on file  . Number of children: Not on file  . Years of education: Not on file  . Highest education level: Not on file  Occupational History  . Not on file  Tobacco Use  . Smoking status: Former Smoker    Packs/day: 1.00    Years: 2.00    Pack years: 2.00    Types: Cigarettes    Quit date: 05/19/2019    Years since quitting: 0.8  . Smokeless tobacco: Never Used  Substance and Sexual Activity  . Alcohol use: Not Currently  . Drug use: No  . Sexual activity: Never    Birth control/protection: Abstinence  Other Topics Concern  . Not on file  Social History Narrative  . Not on file   Social Determinants of Health   Financial Resource Strain:   . Difficulty of Paying Living Expenses: Not on file  Food Insecurity:   . Worried About Charity fundraiser in the Last Year: Not on file   . Ran Out of Food in the Last Year: Not on file  Transportation Needs:   . Lack of Transportation (Medical): Not on file  . Lack of Transportation (Non-Medical): Not on file  Physical Activity:   . Days of Exercise per Week: Not on file  . Minutes of Exercise per Session: Not on file  Stress:   . Feeling of Stress : Not on file  Social Connections:   . Frequency of Communication with Friends and Family: Not on file  . Frequency of Social Gatherings with Friends and Family: Not on file  . Attends Religious Services: Not on file  . Active Member of Clubs or Organizations: Not on file  . Attends Archivist Meetings: Not on file  .  Marital Status: Not on file  Intimate Partner Violence:   . Fear of Current or Ex-Partner: Not on file  . Emotionally Abused: Not on file  . Physically Abused: Not on file  . Sexually Abused: Not on file      Review of Systems  Constitutional: Negative for chills, diaphoresis and fatigue.  HENT: Negative for ear pain, postnasal drip and sinus pressure.   Eyes: Negative for photophobia, discharge, redness, itching and visual disturbance.  Respiratory: Negative for cough, shortness of breath and wheezing.   Cardiovascular: Negative for chest pain, palpitations and leg swelling.  Gastrointestinal: Negative for abdominal pain, constipation, diarrhea, nausea and vomiting.  Genitourinary: Negative for dysuria and flank pain.  Musculoskeletal: Negative for arthralgias, back pain, gait problem and neck pain.  Skin: Negative for color change.  Allergic/Immunologic: Negative for environmental allergies and food allergies.  Neurological: Negative for dizziness and headaches.  Hematological: Does not bruise/bleed easily.  Psychiatric/Behavioral: Negative for agitation, behavioral problems (depression) and hallucinations.    Vital Signs: BP (!) 153/90   Pulse 85   Temp 97.6 F (36.4 C)   Resp 16   Ht '5\' 4"'  (1.626 m)   Wt 169 lb (76.7 kg)   SpO2  99%   BMI 29.01 kg/m    Physical Exam Constitutional:      General: She is not in acute distress.    Appearance: She is well-developed. She is not diaphoretic.  HENT:     Head: Normocephalic and atraumatic.     Mouth/Throat:     Pharynx: No oropharyngeal exudate.  Eyes:     Pupils: Pupils are equal, round, and reactive to light.  Neck:     Thyroid: No thyromegaly.     Vascular: No JVD.     Trachea: No tracheal deviation.  Cardiovascular:     Rate and Rhythm: Normal rate and regular rhythm.     Heart sounds: Normal heart sounds. No murmur heard.  No friction rub. No gallop.   Pulmonary:     Effort: Pulmonary effort is normal. No respiratory distress.     Breath sounds: No wheezing or rales.  Chest:     Chest wall: No tenderness.  Abdominal:     General: Bowel sounds are normal.     Palpations: Abdomen is soft.  Musculoskeletal:        General: Normal range of motion.     Cervical back: Normal range of motion and neck supple.  Lymphadenopathy:     Cervical: No cervical adenopathy.  Skin:    General: Skin is warm and dry.  Neurological:     Mental Status: She is alert and oriented to person, place, and time.     Cranial Nerves: No cranial nerve deficit.  Psychiatric:        Behavior: Behavior normal.        Thought Content: Thought content normal.        Judgment: Judgment normal.        Assessment/Plan: 1. Malignant neoplasm of upper-outer quadrant of right breast in female, estrogen receptor negative ([place]) Pt is encouraged to keep Barbra Sarks app for surgical resection for right breat tumor   2. Essential hypertension Continue Norvasc as before, increase Zestril 30 mg qd, it can be further tirtated on next visit  - lisinopril (ZESTRIL) 30 MG tablet; Take one tab po qd for HTN  Dispense: 90 tablet; Refill: 3  3. Type 2 diabetes mellitus with hyperglycemia, with long-term current use of insulin (HCC) Improved hg  a1c, will continue on current dose  - POCT HgB A1C -  Semaglutide, 1 MG/DOSE, (OZEMPIC, 1 MG/DOSE,) 2 MG/1.5ML SOPN; Inject 1 mg into the skin once a week. FOR DM  Dispense: 6 mL; Refill: 6 - insulin detemir (LEVEMIR FLEXTOUCH) 100 UNIT/ML FlexPen; Inject 44 Units into the skin daily. ALONG WITH NEEDLES  Dispense: 15 mL; Refill: 3  4. Atherosclerosis of aorta (HCC) Start Lipitor, will order another vascular study ( carotid dopplers)  Smoking cessation counseling: 1. Pt acknowledges the risks of long term smoking, she will try to quite smoking. 2. Options for different medications including nicotine products, chewing gum, patch etc, Wellbutrin and Chantix is discussed 3. Goal and date of compete cessation is discussed 4. Total time spent in smoking cessation is 10 min.  5. Mixed hyperlipidemia Will start anti lipid therapy  - atorvastatin (LIPITOR) 10 MG tablet; Take 1 tablet (10 mg total) by mouth daily.  Dispense: 90 tablet; Refill: 3  6. Flu vaccine need - Flu Vaccine MDCK QUAD PF  General Counseling: Lonia verbalizes understanding of the findings of todays visit and agrees with plan of treatment. I have discussed any further diagnostic evaluation that may be needed or ordered today. We also reviewed her medications today. she has been encouraged to call the office with any questions or concerns that should arise related to todays visit.  Orders Placed This Encounter  Procedures  . Flu Vaccine MDCK QUAD PF  . POCT HgB A1C    Meds ordered this encounter  Medications  . lisinopril (ZESTRIL) 30 MG tablet    Sig: Take one tab po qd for HTN    Dispense:  90 tablet    Refill:  3  . Semaglutide, 1 MG/DOSE, (OZEMPIC, 1 MG/DOSE,) 2 MG/1.5ML SOPN    Sig: Inject 1 mg into the skin once a week. FOR DM    Dispense:  6 mL    Refill:  6    Increased dose from 0.78m to 147mweekly.  . insulin detemir (LEVEMIR FLEXTOUCH) 100 UNIT/ML FlexPen    Sig: Inject 44 Units into the skin daily. ALONG WITH NEEDLES    Dispense:  15 mL    Refill:  3  .  atorvastatin (LIPITOR) 10 MG tablet    Sig: Take 1 tablet (10 mg total) by mouth daily.    Dispense:  90 tablet    Refill:  3    Total time spent: 35Minutes Time spent includes review of chart, medications, test results, and follow up plan with the patient.    Dr FoLavera Guisenternal medicine

## 2020-04-03 NOTE — Telephone Encounter (Signed)
Patient has been advised to call [place] [facility] dr toth office asap!, I gave patient contact information, it looks like they tried calling her 03/30/20. Dawn Alvarez

## 2020-04-06 NOTE — Progress Notes (Signed)
Received call from Jarrett Soho RN , with patient questions regarding surgery.  Phone patient and her husband.  They had questions about surgery date, location.  Per Amy at [facility] in scheduling spoke to husband yesterday to let him know surgery will be scheduled at [facility] .  They will call him when appointment made.  Left message for husband and daughter Virgilio Belling with this information.

## 2020-04-12 ENCOUNTER — Encounter: Payer: Self-pay | Admitting: *Deleted

## 2020-04-12 ENCOUNTER — Ambulatory Visit: Payer: Self-pay | Admitting: General Surgery

## 2020-04-12 ENCOUNTER — Other Ambulatory Visit: Payer: Self-pay | Admitting: General Surgery

## 2020-04-12 DIAGNOSIS — C50411 Malignant neoplasm of upper-outer quadrant of right female breast: Secondary | ICD-10-CM

## 2020-04-12 NOTE — Progress Notes (Signed)
Dr. Tasia Catchings would like to see patient about 2 weeks after her surgery.  I have scheduled her to return on 05/18/20 @ 10:00.  Tried to call and inform patient of her appointment, but no answer and no voicemail.  I left a message with her daughter Virgilio Belling about her appointment.  I did request that Virgilio Belling confirm receipt of her message by returning my call.

## 2020-04-30 ENCOUNTER — Other Ambulatory Visit: Payer: Self-pay

## 2020-04-30 ENCOUNTER — Encounter (HOSPITAL_BASED_OUTPATIENT_CLINIC_OR_DEPARTMENT_OTHER): Payer: Self-pay | Admitting: General Surgery

## 2020-05-01 ENCOUNTER — Other Ambulatory Visit
Admission: RE | Admit: 2020-05-01 | Discharge: 2020-05-01 | Disposition: A | Discharge status: Home / Self Care | Payer: Medicare HMO | Source: Ambulatory Visit | Attending: General Surgery | Admitting: General Surgery

## 2020-05-01 DIAGNOSIS — Z01812 Encounter for preprocedural laboratory examination: Secondary | ICD-10-CM | POA: Diagnosis not present

## 2020-05-01 DIAGNOSIS — Z20822 Contact with and (suspected) exposure to covid-19: Secondary | ICD-10-CM | POA: Insufficient documentation

## 2020-05-02 LAB — SARS CORONAVIRUS 2 (TAT 6-24 HRS): SARS Coronavirus 2: NEGATIVE

## 2020-05-03 ENCOUNTER — Encounter (HOSPITAL_BASED_OUTPATIENT_CLINIC_OR_DEPARTMENT_OTHER)
Admission: RE | Admit: 2020-05-03 | Discharge: 2020-05-03 | Disposition: A | Discharge status: Home / Self Care | Payer: Medicare HMO | Source: Ambulatory Visit | Attending: General Surgery | Admitting: General Surgery

## 2020-05-03 ENCOUNTER — Other Ambulatory Visit: Payer: Self-pay

## 2020-05-03 ENCOUNTER — Ambulatory Visit
Admission: RE | Admit: 2020-05-03 | Discharge: 2020-05-03 | Disposition: A | Discharge status: Home / Self Care | Payer: Medicare HMO | Source: Ambulatory Visit | Attending: General Surgery | Admitting: General Surgery

## 2020-05-03 DIAGNOSIS — C50411 Malignant neoplasm of upper-outer quadrant of right female breast: Secondary | ICD-10-CM

## 2020-05-03 DIAGNOSIS — Z01812 Encounter for preprocedural laboratory examination: Secondary | ICD-10-CM | POA: Diagnosis not present

## 2020-05-03 DIAGNOSIS — C50911 Malignant neoplasm of unspecified site of right female breast: Secondary | ICD-10-CM | POA: Diagnosis not present

## 2020-05-03 LAB — BASIC METABOLIC PANEL
Anion gap: 10 (ref 5–15)
BUN: 15 mg/dL (ref 8–23)
CO2: 23 mmol/L (ref 22–32)
Calcium: 9.7 mg/dL (ref 8.9–10.3)
Chloride: 106 mmol/L (ref 98–111)
Creatinine, Ser: 1.16 mg/dL — ABNORMAL HIGH (ref 0.44–1.00)
GFR, Estimated: 53 mL/min — ABNORMAL LOW (ref >60)
Glucose, Bld: 126 mg/dL — ABNORMAL HIGH (ref 70–99)
Potassium: 4.7 mmol/L (ref 3.5–5.1)
Sodium: 139 mmol/L (ref 135–145)

## 2020-05-03 NOTE — Progress Notes (Signed)

## 2020-05-04 ENCOUNTER — Encounter (HOSPITAL_COMMUNITY)
Admission: RE | Admit: 2020-05-04 | Discharge: 2020-05-04 | Disposition: A | Discharge status: Home / Self Care | Payer: Medicare HMO | Source: Ambulatory Visit | Attending: General Surgery | Admitting: General Surgery

## 2020-05-04 ENCOUNTER — Ambulatory Visit (HOSPITAL_BASED_OUTPATIENT_CLINIC_OR_DEPARTMENT_OTHER)
Admission: RE | Admit: 2020-05-04 | Discharge: 2020-05-04 | Disposition: A | Discharge status: Home / Self Care | Payer: Medicare HMO | Source: Ambulatory Visit | Attending: General Surgery | Admitting: General Surgery

## 2020-05-04 ENCOUNTER — Ambulatory Visit
Admission: RE | Admit: 2020-05-04 | Discharge: 2020-05-04 | Disposition: A | Discharge status: Home / Self Care | Payer: Medicare HMO | Source: Ambulatory Visit | Attending: General Surgery | Admitting: General Surgery

## 2020-05-04 ENCOUNTER — Ambulatory Visit (HOSPITAL_BASED_OUTPATIENT_CLINIC_OR_DEPARTMENT_OTHER): Payer: Medicare HMO | Admitting: Certified Registered"

## 2020-05-04 ENCOUNTER — Encounter (HOSPITAL_BASED_OUTPATIENT_CLINIC_OR_DEPARTMENT_OTHER): Payer: Self-pay | Admitting: General Surgery

## 2020-05-04 ENCOUNTER — Other Ambulatory Visit: Payer: Self-pay

## 2020-05-04 ENCOUNTER — Encounter (HOSPITAL_BASED_OUTPATIENT_CLINIC_OR_DEPARTMENT_OTHER)
Admission: RE | Disposition: A | Discharge status: Home / Self Care | Payer: Self-pay | Source: Ambulatory Visit | Attending: General Surgery

## 2020-05-04 DIAGNOSIS — G8918 Other acute postprocedural pain: Secondary | ICD-10-CM | POA: Diagnosis not present

## 2020-05-04 DIAGNOSIS — J449 Chronic obstructive pulmonary disease, unspecified: Secondary | ICD-10-CM | POA: Diagnosis not present

## 2020-05-04 DIAGNOSIS — E119 Type 2 diabetes mellitus without complications: Secondary | ICD-10-CM | POA: Diagnosis not present

## 2020-05-04 DIAGNOSIS — Z79899 Other long term (current) drug therapy: Secondary | ICD-10-CM | POA: Insufficient documentation

## 2020-05-04 DIAGNOSIS — D0511 Intraductal carcinoma in situ of right breast: Secondary | ICD-10-CM | POA: Diagnosis not present

## 2020-05-04 DIAGNOSIS — I10 Essential (primary) hypertension: Secondary | ICD-10-CM | POA: Diagnosis not present

## 2020-05-04 DIAGNOSIS — N6489 Other specified disorders of breast: Secondary | ICD-10-CM | POA: Diagnosis not present

## 2020-05-04 DIAGNOSIS — Z171 Estrogen receptor negative status [ER-]: Secondary | ICD-10-CM | POA: Diagnosis not present

## 2020-05-04 DIAGNOSIS — N6011 Diffuse cystic mastopathy of right breast: Secondary | ICD-10-CM | POA: Diagnosis not present

## 2020-05-04 DIAGNOSIS — Z87891 Personal history of nicotine dependence: Secondary | ICD-10-CM | POA: Insufficient documentation

## 2020-05-04 DIAGNOSIS — C50411 Malignant neoplasm of upper-outer quadrant of right female breast: Secondary | ICD-10-CM

## 2020-05-04 DIAGNOSIS — R928 Other abnormal and inconclusive findings on diagnostic imaging of breast: Secondary | ICD-10-CM | POA: Diagnosis not present

## 2020-05-04 DIAGNOSIS — Z823 Family history of stroke: Secondary | ICD-10-CM | POA: Insufficient documentation

## 2020-05-04 DIAGNOSIS — E782 Mixed hyperlipidemia: Secondary | ICD-10-CM | POA: Diagnosis not present

## 2020-05-04 DIAGNOSIS — C50911 Malignant neoplasm of unspecified site of right female breast: Secondary | ICD-10-CM | POA: Diagnosis not present

## 2020-05-04 HISTORY — PX: BREAST LUMPECTOMY: SHX2

## 2020-05-04 HISTORY — PX: BREAST LUMPECTOMY WITH RADIOACTIVE SEED AND SENTINEL LYMPH NODE BIOPSY: SHX6550

## 2020-05-04 LAB — GLUCOSE, CAPILLARY
Glucose-Capillary: 97 mg/dL (ref 70–99)
Glucose-Capillary: 135 mg/dL — ABNORMAL HIGH (ref 70–99)

## 2020-05-04 SURGERY — BREAST LUMPECTOMY WITH RADIOACTIVE SEED AND SENTINEL LYMPH NODE BIOPSY
Anesthesia: Regional | Site: Breast | Laterality: Right

## 2020-05-04 MED ORDER — MIDAZOLAM HCL 2 MG/2ML IJ SOLN
INTRAMUSCULAR | Status: AC
  Filled 2020-05-04: qty 2

## 2020-05-04 MED ORDER — GABAPENTIN 300 MG PO CAPS
ORAL_CAPSULE | ORAL | Status: AC
  Filled 2020-05-04: qty 1

## 2020-05-04 MED ORDER — OXYCODONE HCL 5 MG PO TABS
5.0000 mg | ORAL_TABLET | Freq: Once | ORAL | Status: AC | PRN
  Administered 2020-05-04: 5 mg via ORAL

## 2020-05-04 MED ORDER — TECHNETIUM TC 99M TILMANOCEPT KIT
1.0000 | PACK | Freq: Once | INTRAVENOUS | Status: AC | PRN
  Administered 2020-05-04: 1 via INTRADERMAL

## 2020-05-04 MED ORDER — FENTANYL CITRATE (PF) 100 MCG/2ML IJ SOLN
INTRAMUSCULAR | Status: AC
  Filled 2020-05-04: qty 2

## 2020-05-04 MED ORDER — ACETAMINOPHEN 500 MG PO TABS
1000.0000 mg | ORAL_TABLET | ORAL | Status: AC
  Administered 2020-05-04: 1000 mg via ORAL

## 2020-05-04 MED ORDER — CEFAZOLIN SODIUM-DEXTROSE 2-4 GM/100ML-% IV SOLN
2.0000 g | INTRAVENOUS | Status: AC
  Administered 2020-05-04: 2 g via INTRAVENOUS

## 2020-05-04 MED ORDER — GABAPENTIN 300 MG PO CAPS
300.0000 mg | ORAL_CAPSULE | ORAL | Status: AC
  Administered 2020-05-04: 300 mg via ORAL

## 2020-05-04 MED ORDER — CELECOXIB 200 MG PO CAPS: 200.0000 mg | ORAL_CAPSULE | ORAL | Status: DC

## 2020-05-04 MED ORDER — BUPIVACAINE HCL (PF) 0.25 % IJ SOLN
INTRAMUSCULAR | Status: DC | PRN
  Administered 2020-05-04: 10 mL

## 2020-05-04 MED ORDER — CHLORHEXIDINE GLUCONATE CLOTH 2 % EX PADS: 6.0000 | MEDICATED_PAD | Freq: Once | CUTANEOUS | Status: DC

## 2020-05-04 MED ORDER — OXYCODONE HCL 5 MG PO TABS
ORAL_TABLET | ORAL | Status: AC
  Filled 2020-05-04: qty 1

## 2020-05-04 MED ORDER — BUPIVACAINE LIPOSOME 1.3 % IJ SUSP
INTRAMUSCULAR | Status: DC | PRN
  Administered 2020-05-04: 10 mL

## 2020-05-04 MED ORDER — CELECOXIB 200 MG PO CAPS
200.0000 mg | ORAL_CAPSULE | ORAL | Status: AC
  Administered 2020-05-04: 200 mg via ORAL

## 2020-05-04 MED ORDER — DEXAMETHASONE SODIUM PHOSPHATE 4 MG/ML IJ SOLN
INTRAMUSCULAR | Status: DC | PRN
  Administered 2020-05-04: 4 mg via INTRAVENOUS

## 2020-05-04 MED ORDER — FENTANYL CITRATE (PF) 100 MCG/2ML IJ SOLN: 25.0000 ug | INTRAMUSCULAR | Status: DC | PRN

## 2020-05-04 MED ORDER — FENTANYL CITRATE (PF) 100 MCG/2ML IJ SOLN
100.0000 ug | Freq: Once | INTRAMUSCULAR | Status: AC
  Administered 2020-05-04: 50 ug via INTRAVENOUS

## 2020-05-04 MED ORDER — FENTANYL CITRATE (PF) 100 MCG/2ML IJ SOLN
INTRAMUSCULAR | Status: DC | PRN
  Administered 2020-05-04: 25 ug via INTRAVENOUS

## 2020-05-04 MED ORDER — CEFAZOLIN SODIUM-DEXTROSE 2-4 GM/100ML-% IV SOLN
INTRAVENOUS | Status: AC
  Filled 2020-05-04: qty 100

## 2020-05-04 MED ORDER — ACETAMINOPHEN 500 MG PO TABS: 1000.0000 mg | ORAL_TABLET | ORAL | Status: DC

## 2020-05-04 MED ORDER — LACTATED RINGERS IV SOLN: INTRAVENOUS | Status: DC

## 2020-05-04 MED ORDER — ACETAMINOPHEN 500 MG PO TABS
ORAL_TABLET | ORAL | Status: AC
  Filled 2020-05-04: qty 2

## 2020-05-04 MED ORDER — AMISULPRIDE (ANTIEMETIC) 5 MG/2ML IV SOLN: 10.0000 mg | Freq: Once | INTRAVENOUS | Status: DC | PRN

## 2020-05-04 MED ORDER — LIDOCAINE 2% (20 MG/ML) 5 ML SYRINGE
INTRAMUSCULAR | Status: DC | PRN
  Administered 2020-05-04: 30 mg via INTRAVENOUS

## 2020-05-04 MED ORDER — CELECOXIB 200 MG PO CAPS
ORAL_CAPSULE | ORAL | Status: AC
  Filled 2020-05-04: qty 1

## 2020-05-04 MED ORDER — EPHEDRINE SULFATE 50 MG/ML IJ SOLN
INTRAMUSCULAR | Status: DC | PRN
  Administered 2020-05-04: 10 mg via INTRAVENOUS

## 2020-05-04 MED ORDER — ONDANSETRON HCL 4 MG/2ML IJ SOLN
INTRAMUSCULAR | Status: DC | PRN
  Administered 2020-05-04: 4 mg via INTRAVENOUS

## 2020-05-04 MED ORDER — OXYCODONE HCL 5 MG/5ML PO SOLN: 5.0000 mg | Freq: Once | ORAL | Status: AC | PRN

## 2020-05-04 MED ORDER — HYDROCODONE-ACETAMINOPHEN 5-325 MG PO TABS: 1.0000 | ORAL_TABLET | Freq: Four times a day (QID) | ORAL | 0 refills | Status: DC | PRN

## 2020-05-04 MED ORDER — CEFAZOLIN SODIUM-DEXTROSE 2-4 GM/100ML-% IV SOLN: 2.0000 g | INTRAVENOUS | Status: DC

## 2020-05-04 MED ORDER — PROMETHAZINE HCL 25 MG/ML IJ SOLN: 6.2500 mg | INTRAMUSCULAR | Status: DC | PRN

## 2020-05-04 MED ORDER — GABAPENTIN 300 MG PO CAPS: 300.0000 mg | ORAL_CAPSULE | ORAL | Status: DC

## 2020-05-04 MED ORDER — PROPOFOL 10 MG/ML IV BOLUS
INTRAVENOUS | Status: DC | PRN
  Administered 2020-05-04: 150 mg via INTRAVENOUS

## 2020-05-04 MED ORDER — BUPIVACAINE HCL (PF) 0.5 % IJ SOLN
INTRAMUSCULAR | Status: DC | PRN
  Administered 2020-05-04: 20 mL

## 2020-05-04 MED ORDER — MIDAZOLAM HCL 2 MG/2ML IJ SOLN
2.0000 mg | Freq: Once | INTRAMUSCULAR | Status: AC
  Administered 2020-05-04: 2 mg via INTRAVENOUS

## 2020-05-04 SURGICAL SUPPLY — 45 items
ADH SKN CLS APL DERMABOND .7 (GAUZE/BANDAGES/DRESSINGS) ×1
APL PRP STRL LF DISP 70% ISPRP (MISCELLANEOUS) ×1
APPLIER CLIP 9.375 MED OPEN (MISCELLANEOUS) ×6
APR CLP MED 9.3 20 MLT OPN (MISCELLANEOUS) ×2
BLADE SURG 15 STRL LF DISP TIS (BLADE) ×1 IMPLANT
BLADE SURG 15 STRL SS (BLADE) ×3
CANISTER SUC SOCK COL 7IN (MISCELLANEOUS) IMPLANT
CANISTER SUCT 1200ML W/VALVE (MISCELLANEOUS) ×3 IMPLANT
CHLORAPREP W/TINT 26 (MISCELLANEOUS) ×3 IMPLANT
CLIP APPLIE 9.375 MED OPEN (MISCELLANEOUS) ×2 IMPLANT
COVER BACK TABLE 60X90IN (DRAPES) ×3 IMPLANT
COVER MAYO STAND STRL (DRAPES) ×3 IMPLANT
COVER PROBE W GEL 5X96 (DRAPES) ×3 IMPLANT
COVER WAND RF STERILE (DRAPES) IMPLANT
DECANTER SPIKE VIAL GLASS SM (MISCELLANEOUS) ×3 IMPLANT
DERMABOND ADVANCED (GAUZE/BANDAGES/DRESSINGS) ×2
DERMABOND ADVANCED .7 DNX12 (GAUZE/BANDAGES/DRESSINGS) ×1 IMPLANT
DRAPE LAPAROSCOPIC ABDOMINAL (DRAPES) ×3 IMPLANT
DRAPE UTILITY XL STRL (DRAPES) ×3 IMPLANT
ELECT COATED BLADE 2.86 ST (ELECTRODE) ×3 IMPLANT
ELECT REM PT RETURN 9FT ADLT (ELECTROSURGICAL) ×3
ELECTRODE REM PT RTRN 9FT ADLT (ELECTROSURGICAL) ×1 IMPLANT
GLOVE BIO SURGEON STRL SZ7.5 (GLOVE) ×3 IMPLANT
GOWN STRL REUS W/ TWL LRG LVL3 (GOWN DISPOSABLE) ×2 IMPLANT
GOWN STRL REUS W/TWL LRG LVL3 (GOWN DISPOSABLE) ×6
ILLUMINATOR WAVEGUIDE N/F (MISCELLANEOUS) IMPLANT
KIT MARKER MARGIN INK (KITS) ×3 IMPLANT
LIGHT WAVEGUIDE WIDE FLAT (MISCELLANEOUS) IMPLANT
NDL SAFETY ECLIPSE 18X1.5 (NEEDLE) IMPLANT
NEEDLE HYPO 18GX1.5 SHARP (NEEDLE)
NEEDLE HYPO 25X1 1.5 SAFETY (NEEDLE) ×3 IMPLANT
NS IRRIG 1000ML POUR BTL (IV SOLUTION) ×3 IMPLANT
PACK BASIN DAY SURGERY FS (CUSTOM PROCEDURE TRAY) ×3 IMPLANT
PENCIL SMOKE EVACUATOR (MISCELLANEOUS) ×3 IMPLANT
SLEEVE SCD COMPRESS KNEE MED (MISCELLANEOUS) ×3 IMPLANT
SPONGE LAP 18X18 RF (DISPOSABLE) ×3 IMPLANT
SUT MON AB 4-0 PC3 18 (SUTURE) ×3 IMPLANT
SUT SILK 2 0 SH (SUTURE) IMPLANT
SUT VICRYL 3-0 CR8 SH (SUTURE) ×3 IMPLANT
SYR CONTROL 10ML LL (SYRINGE) ×3 IMPLANT
TOWEL GREEN STERILE FF (TOWEL DISPOSABLE) ×3 IMPLANT
TRAY FAXITRON CT DISP (TRAY / TRAY PROCEDURE) ×3 IMPLANT
TUBE CONNECTING 20'X1/4 (TUBING) ×1
TUBE CONNECTING 20X1/4 (TUBING) ×2 IMPLANT
YANKAUER SUCT BULB TIP NO VENT (SUCTIONS) ×3 IMPLANT

## 2020-05-04 NOTE — Op Note (Signed)
05/04/2020  9:52 AM  PATIENT:  Dawn Alvarez  64 y.o. female  PRE-OPERATIVE DIAGNOSIS:  RIGHT BREAST CANCER  POST-OPERATIVE DIAGNOSIS:  RIGHT BREAST CANCER  PROCEDURE:  Procedure(s): RIGHT BREAST LUMPECTOMY WITH BRACKETED RADIOACTIVE SEED LOCALIZATION AND DEEP RIGHT AXILLARY SENTINEL LYMPH NODE BIOPSY (Right)  SURGEON:  Surgeon(s) and Role:    * Jovita Kussmaul, MD - Primary  PHYSICIAN ASSISTANT:   ASSISTANTS: none   ANESTHESIA:   local and general  EBL:  minimal   BLOOD ADMINISTERED:none  DRAINS: none   LOCAL MEDICATIONS USED:  MARCAINE     SPECIMEN:  Source of Specimen:  right breast tissue with additional medial margin and sentinel nodes x 3  DISPOSITION OF SPECIMEN:  PATHOLOGY  COUNTS:  YES  TOURNIQUET:  * No tourniquets in log *  DICTATION: .Dragon Dictation   After informed consent was obtained the patient was brought to the operating room and placed in the supine position on the operating table.  After adequate induction of general anesthesia the patient's right chest, breast, and axillary area were prepped with ChloraPrep, allowed to dry, and draped in usual sterile manner.  An appropriate timeout was performed.  Previously an I-125 seed was placed in the upper outer quadrant of the right breast to mark an area of invasive breast cancer.  Also earlier in the day the patient underwent injection of 1 mCi of technetium sulfur colloid in the subareolar position on the right.  The neoprobe was initially set to technetium and there was a good signal in the right axilla.  The neoprobe was then set to I-125 in the area of radioactivity from the seed was also identified readily in the high upper outer quadrant of the breast very close to the axilla.  Because of the proximity of the 2 I elected to do all of this through 1 incision.  I made an elliptical incision in the skin overlying the area of the radioactive seed with a 15 blade knife.  The area around this was infiltrated  with quarter percent Marcaine.  The incision was carried through the skin and subcutaneous tissue sharply with the electrocautery.  Dissection was then carried out widely around the radioactive seed while checking the area of radioactivity frequently and the dissection was carried all the way to the chest wall.  Once the specimen was removed it was oriented with the appropriate paint colors.  A specimen radiograph was obtained that showed the clip and seed to be near the center of the specimen.  I did elect to remove an additional medial margin based on the image.  This was marked appropriately and also sent to pathology with the lumpectomy specimen.  The neoprobe was then set to technetium and the neoprobe was used to direct sharp Bovie dissection into the deep right axillary space.  The neoprobe was then used to direct blunt hemostat dissection.  I was able to identify 3 hot lymph nodes.  Each of these was excised sharply with the electrocautery and the surrounding small vessels and lymphatics were controlled with clips.  Ex vivo counts on these nodes ranged from 200 to 2000.  No other hot or palpable nodes were identified in the right axilla.  Hemostasis was achieved using the Bovie electrocautery.  The lumpectomy cavity was marked with clips.  The axilla was then closed with interrupted 3-0 Vicryl stitches.  The lumpectomy cavity was then closed also with layers of interrupted 3-0 Vicryl stitches.  The skin was closed with a  running 4-0 Monocryl subcuticular stitch.  Dermabond dressings were applied.  The patient tolerated the procedure well.  At the end of the case all needle sponge and instrument counts were correct.  The patient was then awakened and taken to recovery in stable condition.  PLAN OF CARE: Discharge to home after PACU  PATIENT DISPOSITION:  PACU - hemodynamically stable.   Delay start of Pharmacological VTE agent (>24hrs) due to surgical blood loss or risk of bleeding: not applicable

## 2020-05-04 NOTE — Progress Notes (Signed)
Nuclear med at bedside to do lymphoseek injections in right breast.  Patient tolerated procedure well.

## 2020-05-04 NOTE — Discharge Instructions (Signed)
  Post Anesthesia Home Care Instructions  Activity: Get plenty of rest for the remainder of the day. A responsible individual must stay with you for 24 hours following the procedure.  For the next 24 hours, DO NOT: -Drive a car -Operate machinery -Drink alcoholic beverages -Take any medication unless instructed by your physician -Make any legal decisions or sign important papers.  Meals: Start with liquid foods such as gelatin or soup. Progress to regular foods as tolerated. Avoid greasy, spicy, heavy foods. If nausea and/or vomiting occur, drink only clear liquids until the nausea and/or vomiting subsides. Call your physician if vomiting continues.  Special Instructions/Symptoms: Your throat may feel dry or sore from the anesthesia or the breathing tube placed in your throat during surgery. If this causes discomfort, gargle with warm salt water. The discomfort should disappear within 24 hours.  If you had a scopolamine patch placed behind your ear for the management of post- operative nausea and/or vomiting:  1. The medication in the patch is effective for 72 hours, after which it should be removed.  Wrap patch in a tissue and discard in the trash. Wash hands thoroughly with soap and water. 2. You may remove the patch earlier than 72 hours if you experience unpleasant side effects which may include dry mouth, dizziness or visual disturbances. 3. Avoid touching the patch. Wash your hands with soap and water after contact with the patch.    Information for Discharge Teaching: EXPAREL (bupivacaine liposome injectable suspension)   Your surgeon or anesthesiologist gave you EXPAREL(bupivacaine) to help control your pain after surgery.  EXPAREL is a local anesthetic that provides pain relief by numbing the tissue around the surgical site. EXPAREL is designed to release pain medication over time and can control pain for up to 72 hours. Depending on how you respond to EXPAREL, you may require  less pain medication during your recovery.  Possible side effects: Temporary loss of sensation or ability to move in the area where bupivacaine was injected. Nausea, vomiting, constipation Rarely, numbness and tingling in your mouth or lips, lightheadedness, or anxiety may occur. Call your doctor right away if you think you may be experiencing any of these sensations, or if you have other questions regarding possible side effects.  Follow all other discharge instructions given to you by your surgeon or nurse. Eat a healthy diet and drink plenty of water or other fluids.  If you return to the hospital for any reason within 96 hours following the administration of EXPAREL, it is important for health care providers to know that you have received this anesthetic. A teal colored band has been placed on your arm with the date, time and amount of EXPAREL you have received in order to alert and inform your health care providers. Please leave this armband in place for the full 96 hours following administration, and then you may remove the band.  

## 2020-05-04 NOTE — Anesthesia Procedure Notes (Signed)
Anesthesia Regional Block: Pectoralis block   Pre-Anesthetic Checklist: ,, timeout performed, Correct Patient, Correct Site, Correct Laterality, Correct Procedure, Correct Position, site marked, Risks and benefits discussed,  Surgical consent,  Pre-op evaluation,  At surgeon's request and post-op pain management  Laterality: Right  Prep: chloraprep       Needles:  Injection technique: Single-shot  Needle Type: Echogenic Stimulator Needle     Needle Length: 10cm  Needle Gauge: 20     Additional Needles:   Procedures:,,,, ultrasound used (permanent image in chart),,,,  Narrative:  Start time: 05/04/2020 7:55 AM End time: 05/04/2020 8:00 AM Injection made incrementally with aspirations every 5 mL.  Performed by: Personally  Anesthesiologist: Duane Boston, MD  Additional Notes: A functioning IV was confirmed and monitors were applied.  Sterile prep and drape, hand hygiene and sterile gloves were used.  Negative aspiration and test dose prior to incremental administration of local anesthetic. The patient tolerated the procedure well.Ultrasound  guidance: relevant anatomy identified, needle position confirmed, local anesthetic spread visualized around nerve(s), vascular puncture avoided.  Image printed for medical record.

## 2020-05-04 NOTE — Anesthesia Postprocedure Evaluation (Signed)
Anesthesia Post Note  Patient: Dawn Alvarez  Procedure(s) Performed: RIGHT BREAST LUMPECTOMY WITH BRACKETED RADIOACTIVE SEED AND SENTINEL LYMPH NODE BIOPSY (Right Breast)     Patient location during evaluation: PACU Anesthesia Type: Regional and General Level of consciousness: awake Pain management: pain level controlled Vital Signs Assessment: post-procedure vital signs reviewed and stable Respiratory status: spontaneous breathing and respiratory function stable Cardiovascular status: stable Postop Assessment: no apparent nausea or vomiting Anesthetic complications: no   No complications documented.  Last Vitals:  Vitals:   05/04/20 1030 05/04/20 1115  BP: (!) 145/81 (!) 153/81  Pulse: (!) 103 (!) 103  Resp: 13 16  Temp:  (!) 36.4 C  SpO2: 99% 99%    Last Pain:  Vitals:   05/04/20 1130  TempSrc:   PainSc: 5                  Candra R Dieter Hane

## 2020-05-04 NOTE — Progress Notes (Signed)
Assisted Dr. Elgie Congo with right, ultrasound guided, axillary block. Side rails up, monitors on throughout procedure. See vital signs in flow sheet. Tolerated Procedure well.

## 2020-05-04 NOTE — Interval H&P Note (Signed)
History and Physical Interval Note:  05/04/2020 8:15 AM  Dawn Alvarez  has presented today for surgery, with the diagnosis of RIGHT BREAST CANCER.  The various methods of treatment have been discussed with the patient and family. After consideration of risks, benefits and other options for treatment, the patient has consented to  Procedure(s): RIGHT BREAST LUMPECTOMY WITH BRACKETED RADIOACTIVE SEED AND SENTINEL LYMPH NODE BIOPSY (Right) as a surgical intervention.  The patient's history has been reviewed, patient examined, no change in status, stable for surgery.  I have reviewed the patient's chart and labs.  Questions were answered to the patient's satisfaction.     Autumn Messing III

## 2020-05-04 NOTE — Anesthesia Procedure Notes (Signed)
Procedure Name: LMA Insertion Date/Time: 05/04/2020 8:38 AM Performed by: Signe Colt, CRNA Pre-anesthesia Checklist: Patient identified, Emergency Drugs available, Suction available and Patient being monitored Patient Re-evaluated:Patient Re-evaluated prior to induction Oxygen Delivery Method: Circle System Utilized Preoxygenation: Pre-oxygenation with 100% oxygen Induction Type: IV induction Ventilation: Mask ventilation without difficulty LMA: LMA inserted LMA Size: 4.0 Number of attempts: 1 Airway Equipment and Method: bite block Placement Confirmation: positive ETCO2 Tube secured with: Tape Dental Injury: Teeth and Oropharynx as per pre-operative assessment

## 2020-05-04 NOTE — Transfer of Care (Signed)
Immediate Anesthesia Transfer of Care Note  Patient: Dawn Alvarez  Procedure(s) Performed: RIGHT BREAST LUMPECTOMY WITH BRACKETED RADIOACTIVE SEED AND SENTINEL LYMPH NODE BIOPSY (Right Breast)  Patient Location: PACU  Anesthesia Type:GA combined with regional for post-op pain  Level of Consciousness: drowsy and patient cooperative  Airway & Oxygen Therapy: Patient Spontanous Breathing and Patient connected to face mask oxygen  Post-op Assessment: Report given to RN and Post -op Vital signs reviewed and stable  Post vital signs: Reviewed and stable  Last Vitals:  Vitals Value Taken Time  BP    Temp    Pulse 80 05/04/20 1000  Resp    SpO2 100 % 05/04/20 1000  Vitals shown include unvalidated device data.  Last Pain:  Vitals:   05/04/20 0710  TempSrc: Oral  PainSc: 0-No pain      Patients Stated Pain Goal: 3 (92/17/83 7542)  Complications: No complications documented.

## 2020-05-04 NOTE — Anesthesia Preprocedure Evaluation (Addendum)
Anesthesia Evaluation  Patient identified by MRN, date of birth, ID band Patient awake    Reviewed: Allergy & Precautions, NPO status , Patient's Chart, lab work & pertinent test results  Airway Mallampati: II  TM Distance: >3 FB Neck ROM: Full    Dental  (+)  Only 2 remaining teeth:   Pulmonary asthma , COPD, former smoker,    Pulmonary exam normal breath sounds clear to auscultation       Cardiovascular Exercise Tolerance: Good hypertension, Normal cardiovascular exam Rhythm:Regular Rate:Normal     Neuro/Psych  Headaches, negative psych ROS   GI/Hepatic negative GI ROS, Neg liver ROS,   Endo/Other  diabetes  Renal/GU negative Renal ROS     Musculoskeletal negative musculoskeletal ROS (+)   Abdominal   Peds  Hematology negative hematology ROS (+)   Anesthesia Other Findings   Reproductive/Obstetrics                            Anesthesia Physical Anesthesia Plan  ASA: III  Anesthesia Plan: Regional and General   Post-op Pain Management: GA combined w/ Regional for post-op pain   Induction:   PONV Risk Score and Plan: 3 and Midazolam, Dexamethasone, Ondansetron and Scopolamine patch - Pre-op  Airway Management Planned: LMA  Additional Equipment:   Intra-op Plan:   Post-operative Plan: Extubation in OR  Informed Consent: I have reviewed the patients History and Physical, chart, labs and discussed the procedure including the risks, benefits and alternatives for the proposed anesthesia with the patient or authorized representative who has indicated his/her understanding and acceptance.       Plan Discussed with: CRNA and Anesthesiologist  Anesthesia Plan Comments: (PEC block with Exparel and 0.5% bupiv. GA/LMA)        Anesthesia Quick Evaluation

## 2020-05-04 NOTE — H&P (Signed)
Dawn Alvarez  Location: [facility] Patient #: [id number] DOB: [birth year] Married / Language: English / Race: Black or African American Female   History of Present Illness  The patient is a 64 year old female who presents for a follow-up for Breast cancer. the patient is a 64 year old black female who was diagnosed with a 2.8 cm cancer in the upper outer quadrant of the right breast back in January. 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 there was one questionable lymph node that was negative on biopsy. She was treated with neoadjuvant chemotherapy and seemed to tolerate it well. she is now ready for her definitive surgery. She completed chemotherapy about 2 weeks ago.   Problem List/Past Medical MALIGNANT NEOPLASM OF UPPER-OUTER QUADRANT OF RIGHT BREAST IN FEMALE, ESTROGEN RECEPTOR NEGATIVE (C50.411)   Past Surgical History  No pertinent past surgical history   Diagnostic Studies History  Colonoscopy  1-5 years ago  Allergies  No Known Drug Allergies   Medication History  amLODIPine Besylate (10MG  Tablet, Oral) Active. hydrALAZINE HCl (50MG  Tablet, Oral) Active. Lisinopril (20MG  Tablet, Oral) Active. Vitamin D3 (Oral) Specific strength unknown - Active. Brimonidine Tartrate (0.2% Solution, Ophthalmic) Active. Medications Reconciled  Social History Alcohol use  Moderate alcohol use. No caffeine use  No drug use  Tobacco use  Former smoker.  Family History  Cerebrovascular Accident  Mother.  Pregnancy / Birth History  Age of menopause  <45 Maternal age  52-25 Para  17  Other Problems  Diabetes Mellitus  High blood pressure     Review of Systems  General Not Present- Appetite Loss, Chills, Fatigue, Fever, Night Sweats, Weight Gain and Weight Loss. Skin Not Present- Change in Wart/Mole, Dryness, Hives, Jaundice, New Lesions, Non-Healing Wounds, Rash and Ulcer. HEENT Not Present- Earache, Hearing Loss, Hoarseness, Nose Bleed, Oral Ulcers,  Ringing in the Ears, Seasonal Allergies, Sinus Pain, Sore Throat, Visual Disturbances, Wears glasses/contact lenses and Yellow Eyes. Respiratory Not Present- Bloody sputum, Chronic Cough, Difficulty Breathing, Snoring and Wheezing. Cardiovascular Not Present- Chest Pain, Difficulty Breathing Lying Down, Leg Cramps, Palpitations, Rapid Heart Rate, Shortness of Breath and Swelling of Extremities. Gastrointestinal Not Present- Abdominal Pain, Bloating, Bloody Stool, Change in Bowel Habits, Chronic diarrhea, Constipation, Difficulty Swallowing, Excessive gas, Gets full quickly at meals, Hemorrhoids, Indigestion, Nausea, Rectal Pain and Vomiting.  Vitals  Weight: 159.38 lb Height: 61in Body Surface Area: 1.72 m Body Mass Index: 30.11 kg/m  Pulse: 77 (Regular)        Physical Exam  General Mental Status-Alert. General Appearance-Consistent with stated age. Hydration-Well hydrated. Voice-Normal.  Head and Neck Head-normocephalic, atraumatic with no lesions or palpable masses. Trachea-midline. Thyroid Gland Characteristics - normal size and consistency.  Eye Eyeball - Bilateral-Extraocular movements intact. Sclera/Conjunctiva - Bilateral-No scleral icterus.  Chest and Lung Exam Chest and lung exam reveals -quiet, even and easy respiratory effort with no use of accessory muscles and on auscultation, normal breath sounds, no adventitious sounds and normal vocal resonance. Inspection Chest Wall - Normal. Back - normal.  Breast Note: there is now no significant palpable mass in either breast. There is no palpable axillary, supraclavicular, or cervical lymphadenopathy.   Cardiovascular Cardiovascular examination reveals -normal heart sounds, regular rate and rhythm with no murmurs and normal pedal pulses bilaterally.  Abdomen Inspection Inspection of the abdomen reveals - No Hernias. Skin - Scar - no surgical scars. Palpation/Percussion Palpation and  Percussion of the abdomen reveal - Soft, Non Tender, No Rebound tenderness, No Rigidity (guarding) and No hepatosplenomegaly. Auscultation Auscultation of the  abdomen reveals - Bowel sounds normal.  Neurologic Neurologic evaluation reveals -alert and oriented x 3 with no impairment of recent or remote memory. Mental Status-Normal.  Musculoskeletal Normal Exam - Left-Upper Extremity Strength Normal and Lower Extremity Strength Normal. Normal Exam - Right-Upper Extremity Strength Normal and Lower Extremity Strength Normal.  Lymphatic Head & Neck  General Head & Neck Lymphatics: Bilateral - Description - Normal. Axillary  General Axillary Region: Bilateral - Description - Normal. Tenderness - Non Tender. Femoral & Inguinal  Generalized Femoral & Inguinal Lymphatics: Bilateral - Description - Normal. Tenderness - Non Tender.    Assessment & Plan  MALIGNANT NEOPLASM OF UPPER-OUTER QUADRANT OF RIGHT BREAST IN FEMALE, ESTROGEN RECEPTOR NEGATIVE (C50.411) Impression: the patient had a 2.8 cm cancer in the upper outer quadrant of the right breast earlier this year. She was treated with neoadjuvant chemotherapy and tolerated it well. She finished chemotherapy about 2 weeks ago. It is not clear from her mammogram and ultrasound whether the mass is completely shrunk or not. We will evaluate her with MRI to better assess the response. I have discussed with her the different options for treatment and at this point she favors breast conservation if it is appropriate. She will also be a candidate for sentinel node biopsy. I have discussed with her in detail the risks and benefits of the operation as well as some of the technical aspects including the use of radiofrequency seeds for localization and she understands and wishes to proceed. We will call her with the results of her MRI and then proceed accordingly. This patient encounter took 30 minutes today to perform the following: take history,  perform exam, review outside records, interpret imaging, counsel the patient on their diagnosis and document encounter, findings & plan in the EHR Current Plans MRI, breast, w/o and w contrast material(s), including computer-aided detection (CAD real-time lesion detection, characterization and pharmacokinetic analysis), when performed; bilateral(77049)(ACR 9 )(DSN [id number])(G-Code G1004(ME)) (Clinical Indications: Breast cancer, assess treatment response)

## 2020-05-06 IMAGING — MG US  BREAST BX W/ LOC DEV 1ST LESION IMG BX SPEC US GUIDE*R*
1 series · 8 of 8 positions shown · non-contrast
Comparison: Previous exam(s).
COMPARISON: Previous exam(s).

Addendum:
CLINICAL DATA: Ultrasound-guided core needle biopsy was recommended
of a suspicious palpable mass in the upper-outer quadrant of the
right breast.

EXAM:
ULTRASOUND GUIDED RIGHT BREAST CORE NEEDLE BIOPSY

[Series 1: MG view · 0.07mm/px · 8 of 22 slices shown]
[im 1/22]
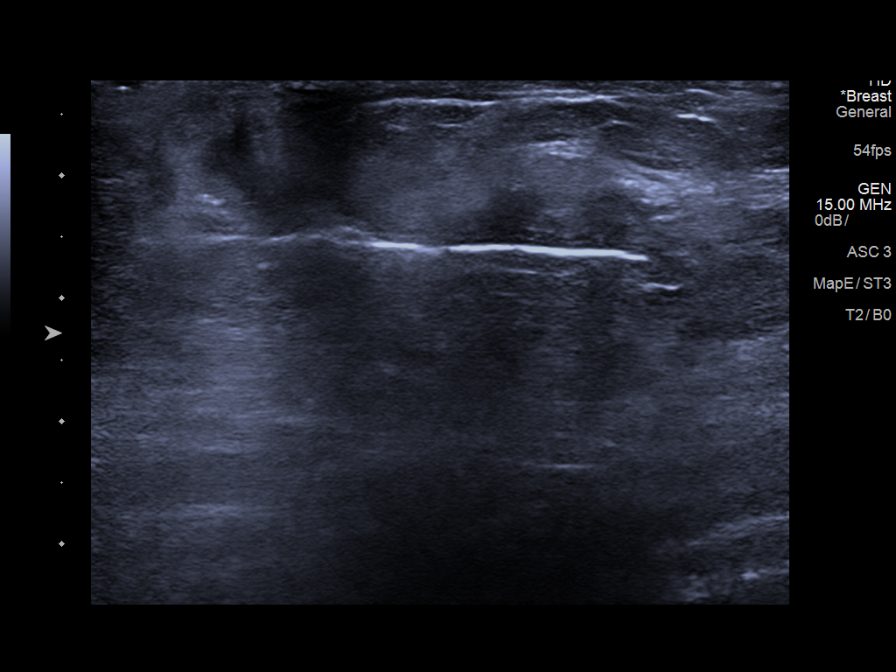
[im 4/22]
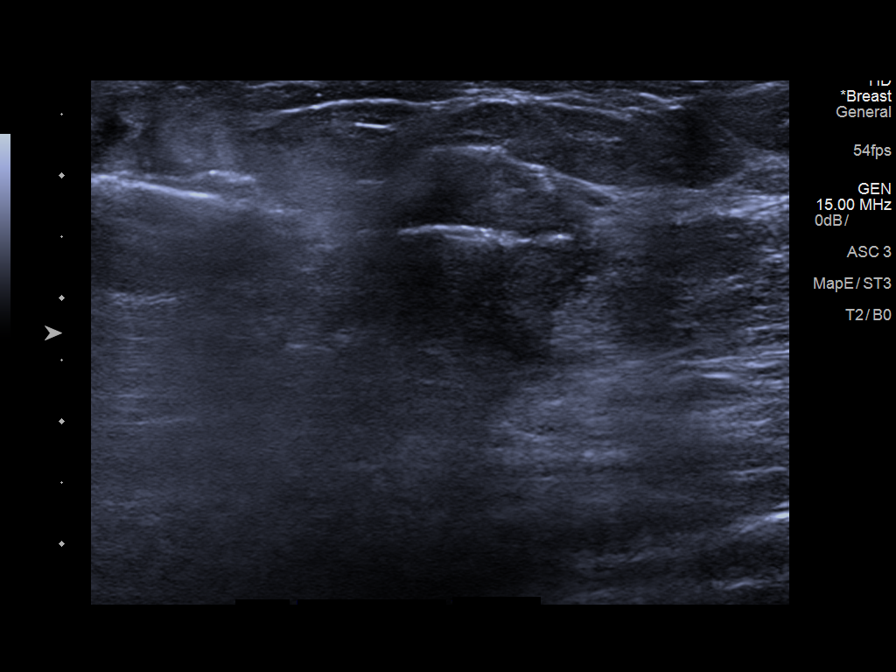
[im 7/22]
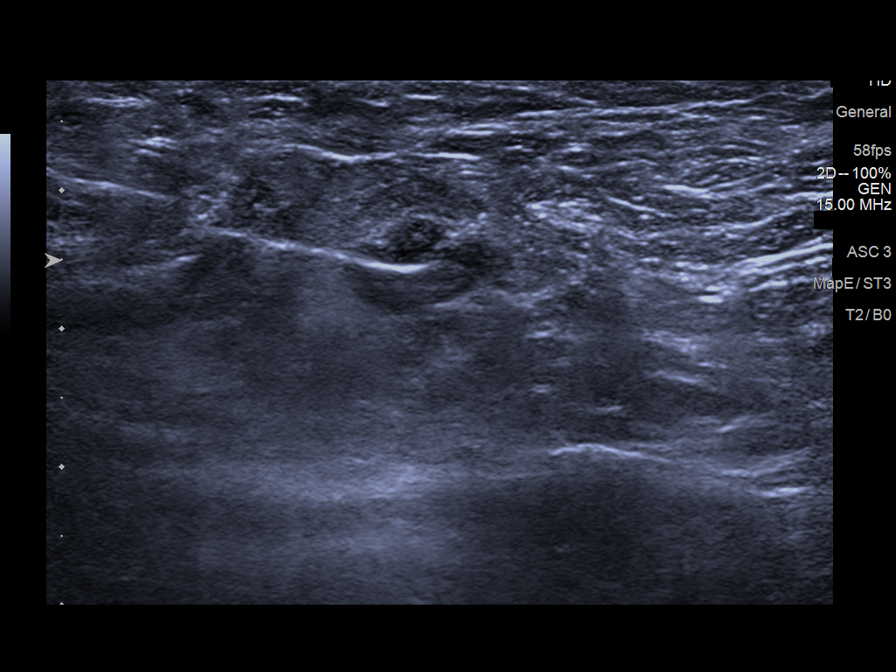
[im 10/22]
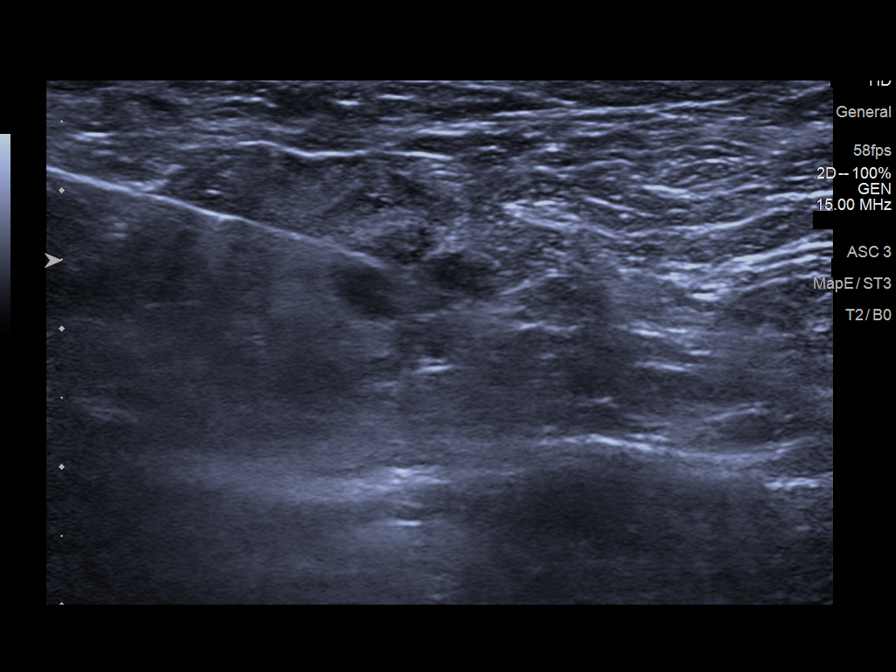
[im 13/22]
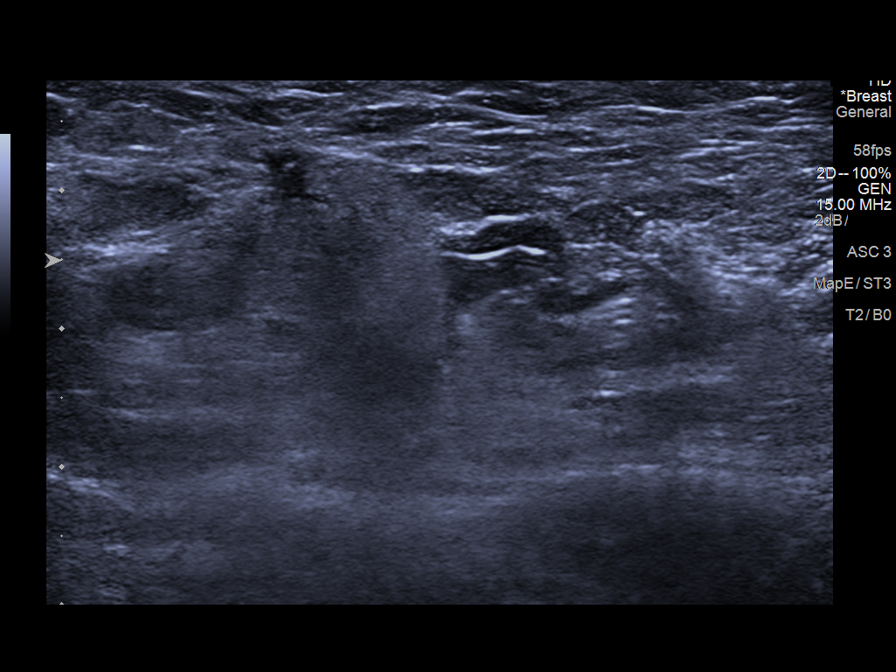
[im 16/22]
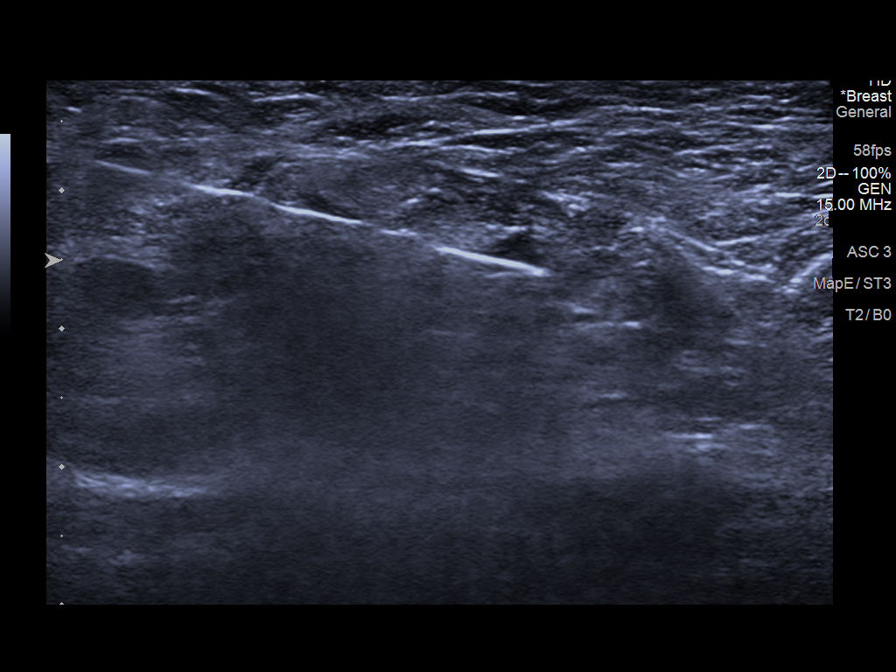
[im 19/22]
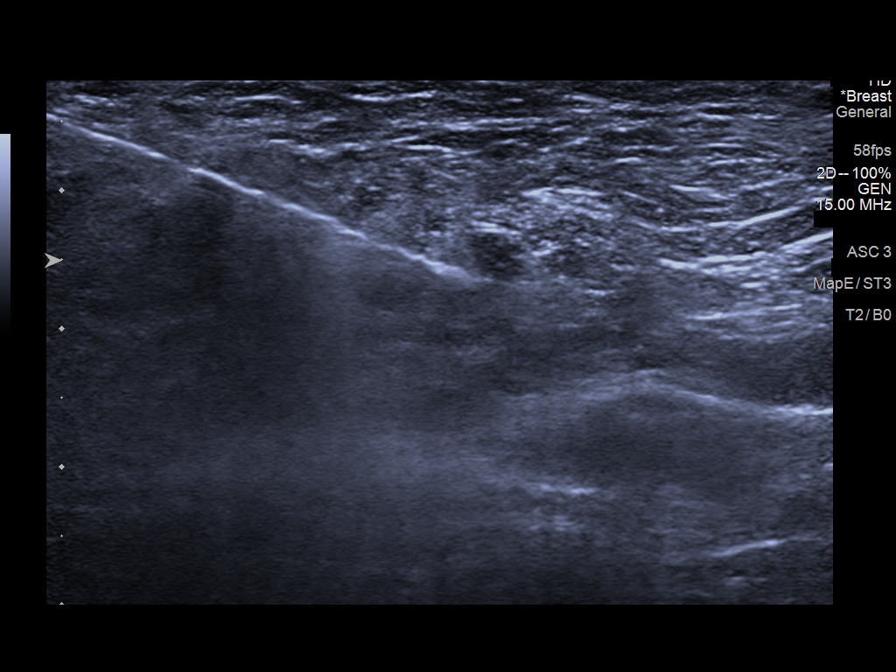
[im 22/22]
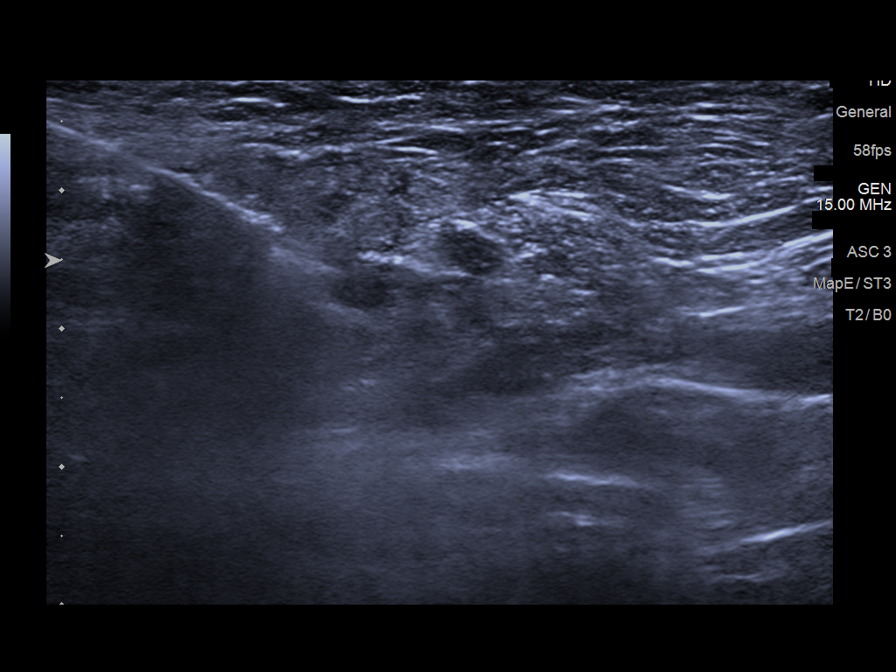

[8 of 8 positions shown; findings below may reference images not displayed]



Lesion quadrant: Upper outer quadrant

Using sterile technique and 1% Lidocaine as local anesthetic, under
direct ultrasound visualization, a 12 gauge Rinnah device was
used to perform biopsy of a 2.8 cm palpable mass in the 10-11
o'clock axis of the right breast approximately 10 cm from the nipple
using a lateral approach. At the conclusion of the procedure Sawafuji
tissue marker clip was deployed into the biopsy cavity. Follow up 2
view mammogram was performed and dictated separately.
IMPRESSION: Ultrasound guided biopsy of the right breast. No apparent
complications.

ADDENDUM:
Pathology revealed GRADE III INVASIVE MAMMARY CARCINOMA, NO SPECIAL
TYPE of the RIGHT breast at 10-11 o'clock, 10 cm from nipple. This
was found to be concordant by Dr. Jullon Lienad.

Pathology revealed PREDOMINANTLY BLOOD AND FIBROADIPOSE TISSUE, WITH
SCANT LYMPHOID TISSUE PRESENT of the RIGHT axillary lymph node. This
was found to be concordant by Dr. Jullon Lienad. Correlate with
sentinel lymph node biopsy.

Pathology results were discussed with the patient by telephone. The
patient reported doing well after the biopsy with tenderness at the
site. Post biopsy instructions and care were reviewed and questions
were answered. The patient was encouraged to call The [HOSPITAL]
[REDACTED] for any additional
concerns.

Surgical and Oncologist referral will be arranged by Kwaiwah Pey RN
and Nichelle Sarpong RN of [HOSPITAL] [HOSPITAL] [REDACTED].

Pathology results reported by Alison Levitt RN on 07/06/2019.



Lesion quadrant: Upper outer quadrant

Using sterile technique and 1% Lidocaine as local anesthetic, under
direct ultrasound visualization, a 12 gauge Rinnah device was
used to perform biopsy of a 2.8 cm palpable mass in the 10-11
o'clock axis of the right breast approximately 10 cm from the nipple
using a lateral approach. At the conclusion of the procedure Sawafuji
tissue marker clip was deployed into the biopsy cavity. Follow up 2
view mammogram was performed and dictated separately.
IMPRESSION: Ultrasound guided biopsy of the right breast. No apparent
complications.

## 2020-05-06 IMAGING — MG MM BREAST LOCALIZATION CLIP
6 series · 6 of 18 positions shown · non-contrast
Comparison: Previous exam(s).

CLINICAL DATA: Ultrasound-guided core needle biopsies were
performed of a suspicious palpable mass upper-outer quadrant right
breast and an axillary lymph node with focal cortical thickening.

EXAM:
DIAGNOSTIC RIGHT MAMMOGRAM POST ULTRASOUND BIOPSIES

[R CC synth-2D]
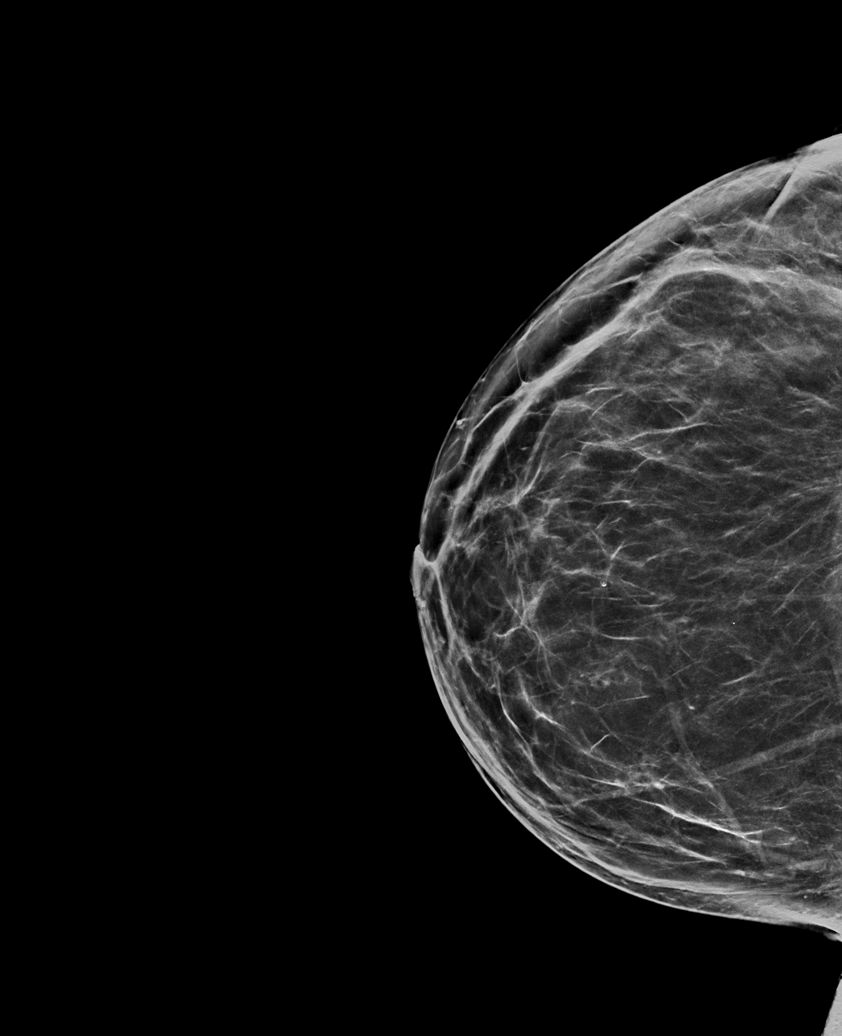

[R LM synth-2D]
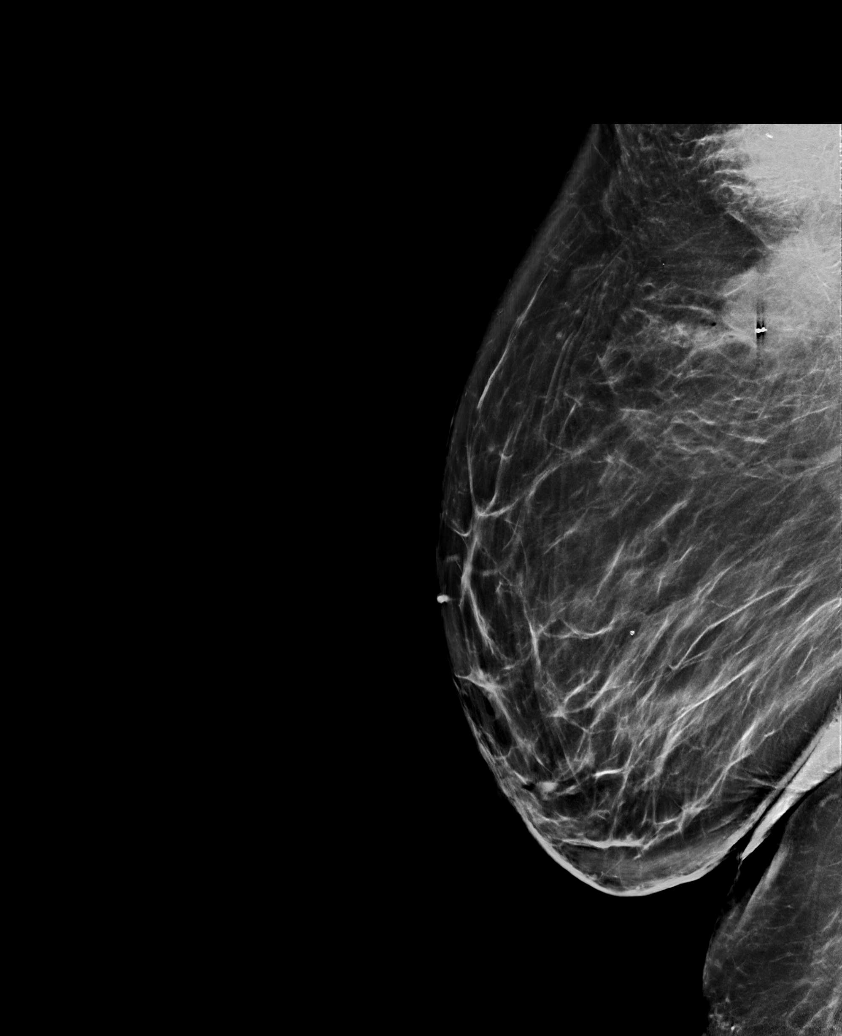

[R XCCL synth-2D]
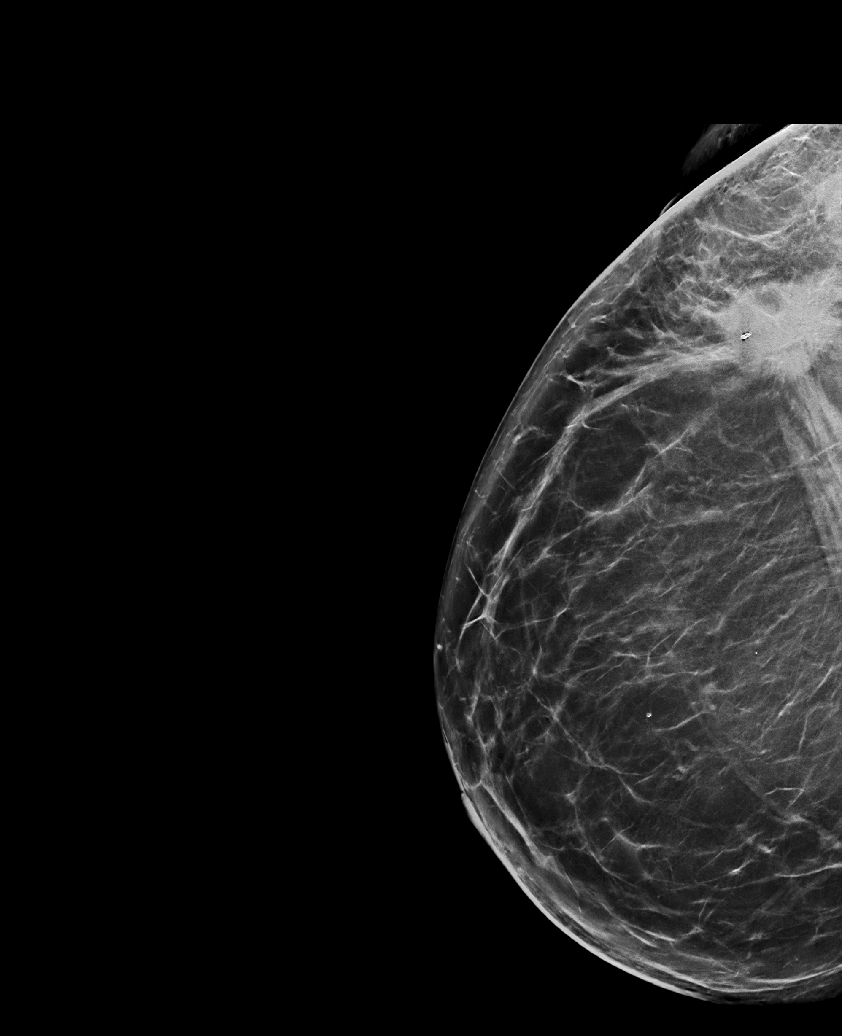

[R XCCL tomo · tomo slice 43/84.0]
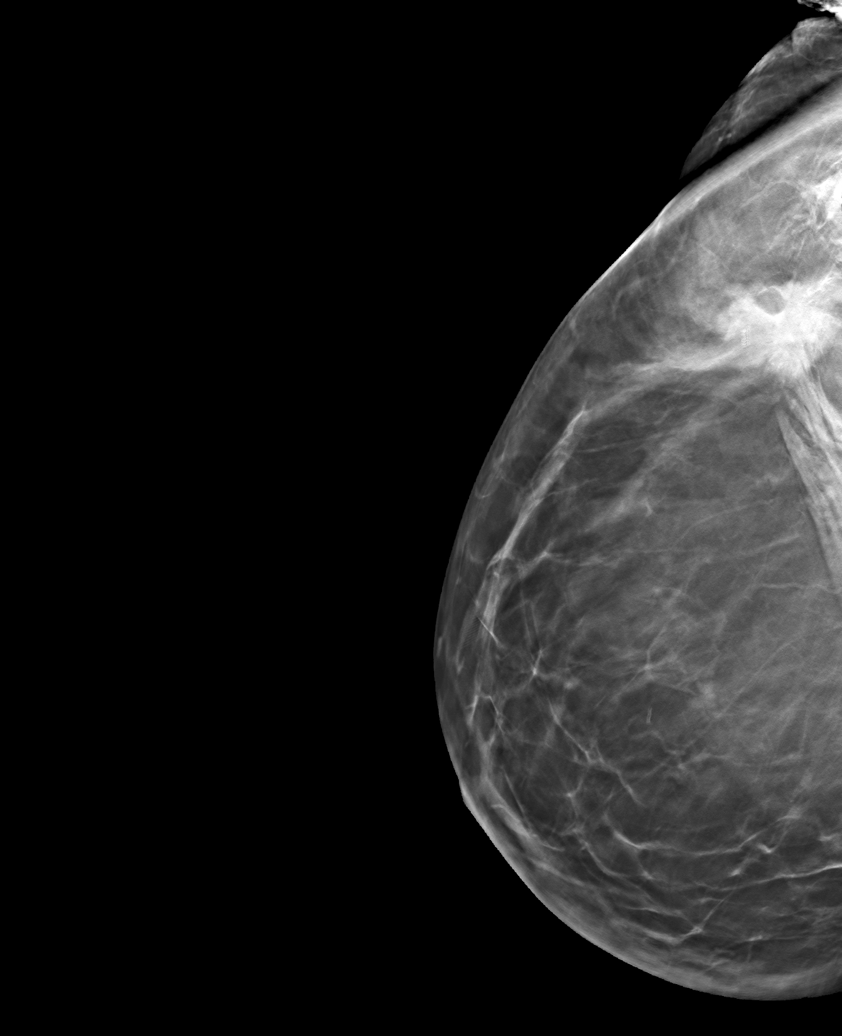

[R LM tomo · tomo slice 48/95.0]
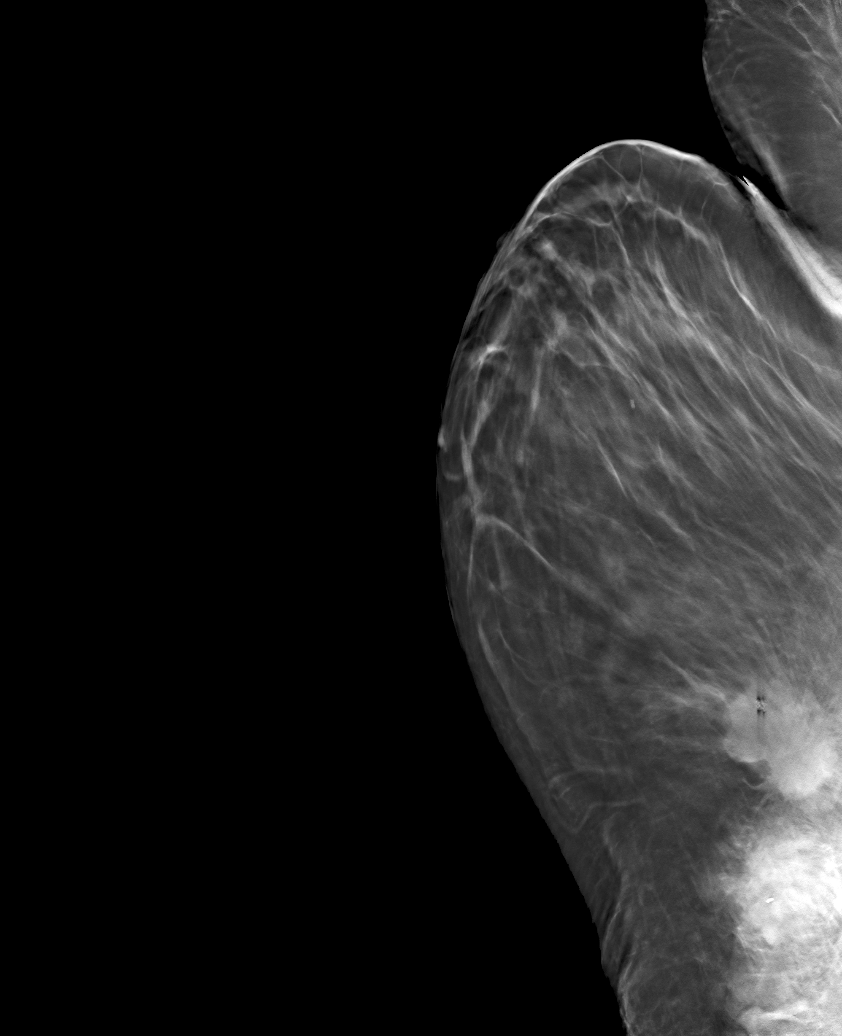

[R CC tomo · tomo slice 37/73.0]
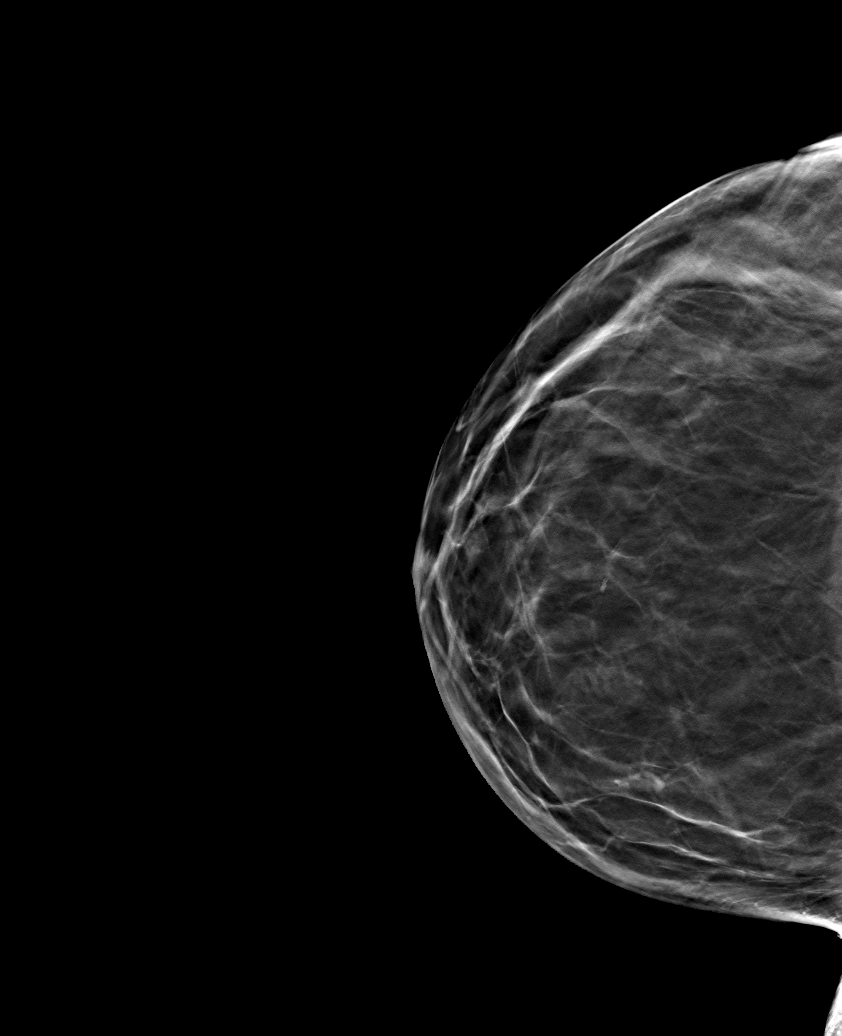

[6 of 18 positions shown; findings below may reference images not displayed]

FINDINGS: Mammographic images were obtained following ultrasound guided biopsy
of a right breast mass in the upper-outer quadrant and
ultrasound-guided biopsy of a right axillary lymph node. The Polin
biopsy marking clip is in expected position at the site of the
biopsied breast mass. HydroMARK biopsy clip is in the expected
location of the biopsied lymph node. There are post biopsy changes
surrounding the axillary biopsy clip.
IMPRESSION: Appropriate positioning of the Polin and HydroMARK biopsy marking
clips in the right breast and right axilla.

Final Assessment: Post Procedure Mammograms for Marker Placement

## 2020-05-07 ENCOUNTER — Encounter (HOSPITAL_BASED_OUTPATIENT_CLINIC_OR_DEPARTMENT_OTHER): Payer: Self-pay | Admitting: General Surgery

## 2020-05-07 LAB — SURGICAL PATHOLOGY

## 2020-05-18 ENCOUNTER — Other Ambulatory Visit: Payer: Self-pay

## 2020-05-18 ENCOUNTER — Encounter: Payer: Self-pay | Admitting: Oncology

## 2020-05-18 ENCOUNTER — Inpatient Hospital Stay: Payer: Medicare HMO

## 2020-05-18 ENCOUNTER — Inpatient Hospital Stay: Payer: Medicare HMO | Attending: Oncology | Admitting: Oncology

## 2020-05-18 VITALS — BP 151/83 | HR 97 | Temp 96.1°F | Resp 16 | Wt 161.4 lb

## 2020-05-18 DIAGNOSIS — C50411 Malignant neoplasm of upper-outer quadrant of right female breast: Secondary | ICD-10-CM | POA: Diagnosis not present

## 2020-05-18 DIAGNOSIS — G629 Polyneuropathy, unspecified: Secondary | ICD-10-CM | POA: Diagnosis not present

## 2020-05-18 DIAGNOSIS — I10 Essential (primary) hypertension: Secondary | ICD-10-CM | POA: Diagnosis not present

## 2020-05-18 DIAGNOSIS — Z171 Estrogen receptor negative status [ER-]: Secondary | ICD-10-CM | POA: Insufficient documentation

## 2020-05-18 DIAGNOSIS — Z79899 Other long term (current) drug therapy: Secondary | ICD-10-CM | POA: Insufficient documentation

## 2020-05-18 DIAGNOSIS — Z87891 Personal history of nicotine dependence: Secondary | ICD-10-CM | POA: Insufficient documentation

## 2020-05-18 DIAGNOSIS — Z95828 Presence of other vascular implants and grafts: Secondary | ICD-10-CM

## 2020-05-18 MED ORDER — HEPARIN SOD (PORK) LOCK FLUSH 100 UNIT/ML IV SOLN
500.0000 [IU] | Freq: Once | INTRAVENOUS | Status: AC
  Administered 2020-05-18: 10:00:00 500 [IU] via INTRAVENOUS
  Filled 2020-05-18: qty 5

## 2020-05-18 MED ORDER — SODIUM CHLORIDE 0.9% FLUSH
10.0000 mL | Freq: Once | INTRAVENOUS | Status: AC
  Administered 2020-05-18: 10:00:00 10 mL via INTRAVENOUS
  Filled 2020-05-18: qty 10

## 2020-05-18 NOTE — Progress Notes (Signed)
Patient denies new problems/concerns today.   °

## 2020-05-18 NOTE — Progress Notes (Signed)
Hematology/Oncology follow up note [facility] [phone] [phone]   Patient Care Team: Lavera Guise, MD as PCP - General (Internal Medicine) Kate Sable, MD as PCP - Cardiology (Cardiology) Rico Junker, RN as Registered Nurse Theodore Demark, RN as Registered Nurse Earlie Server, MD as Consulting Physician (Oncology) Rico Junker, RN as Registered Nurse  REFERRING PROVIDER: Lavera Guise, MD  CHIEF COMPLAINTS/REASON FOR VISIT:  Follow-up for breast cancer  HISTORY OF PRESENTING ILLNESS:   Dawn Alvarez is a  64 y.o.  female with PMH listed below was seen in consultation at the request of  Lavera Guise, MD  for evaluation of breast cancer Patient had screening mammogram done 08/12/2017 which showed right breast asymmetry.  A diagnostic mammogram was suggested and patient did not have it done. Patient felt her right breast mass for a few weeks.  She had bilateral diagnostic mammogram done on 06/30/2019. 2.8 x 2.4 x 2.6 cm right breast mass, 11:00, 10 cm from the nipple.  There is a single mildly abnormal node in the right axilla with a cortex measuring up to 4.4 mm.  No other suspicious findings. Patient underwent ultrasound-guided core biopsy of the right breast mass and right axilla lymph node Pathology showed invasive mammary carcinoma, no special type, grade 3, ER/PR HER-2 status are pending. Right axillary lymph node biopsy showed predominantly blood in the fibroadipose tissue, with scant lymphoid tissue present.  No definite malignancy was identified.  Patient was referred to cancer center to establish care and discuss treatment plan. Menarche 29 Postmenopausal.  LMP when she was 64 years old. She recalls use of birth control pills. Denies any hormone .  Replacement therapy. Denies any prior chest radiation. She reports family history of maternal grandmother and 2 maternal cousins were diagnosed with cancer.  She does  not know about details. # 09/03/2019, patient had an episode of syncope and EMS was called and patient sent to emergency room.  Patient was noted to have a heart rate 45-65 and blood glucose level of 286. Patient denies any history of nausea, vomiting, diarrhea.  Denies any dehydration.  She reported history of intermittent abdominal pain. CT head without contrast showed no acute intracranial abnormality.  CT abdomen pelvis with contrast is negative for evidence of acute abdominal abnormality.  Patient was given IV fluid. Patient was observed with holding her blood pressure medication including amlodipine, bisoprolol.  No arrhythmia was noted on telemetry.  Echocardiogram showed LVEF 60 to 65%.  No valvular abnormalities.  Beta-blocker and diuretics was discontinued.  # Patient was seen by cardiology Dr. Garen Lah for evaluation of syncope and was cleared for chemotherapy. # 10/03/2019, interval unilateral right diagnostic mammogram showed right breast mass 11:00 size has decreased to 3.7 x 1.3 x 2.6 cm.  Previously mass measured 2.8 x 2.4 x 2.6 cm  Neoadjuvant chemotherapy  08/04/2019-09/21/2019 dose dense AC 10/11/2019-01/02/2020 weekly Taxol mammogram after chemotherapy showed decreased size of cancer at that time and was sent back to surgeon for surgery. Patient no showed to Dr. Ethlyn Gallery office in September 2021  03/16/2020 bilateral breast MRI with and without contrast showed previously biopsied mass in the posterior aspect of the upper outer quadrant of the right breast is no longer visualized.  There is a small amount of low-grade linear enhancement extending posteriorly and superiorly from the location towards the right axilla.  Measuring 5.2 x 2.0 x 1.1 cm.  No evidence of malignancy elsewhere in either breast.  No adenopathy.  11/03/2019 genetic testing is negative   INTERVAL HISTORY Dawn Alvarez is a 64 y.o. female who has above history reviewed by me today presents for follow up visit for  management of breast cancer. Problems and complaints are listed below: Patient reports tolerating chemotherapy well. .   05/04/2020 right lumpectomy SLNB ypTis ypN0 High-grade DCIS with calcifications, 1 cm, no residual invasive carcinoma.  Margins not involved.  Extensive fibrosis with patchy mild chronic inflammation.  Patient had 5 sentinel lymph node excision and all lymph nodes were negative. Margins were negative  For chemotherapy-induced neuropathy, on her fingertips and toes, mainly numbness, sometimes burning.  Patient is taking gabapentin 100 mg twice daily.  Patient reports the symptoms are stable. . Review of Systems  Constitutional: Negative for appetite change, chills, fatigue and fever.  HENT:   Negative for hearing loss and voice change.   Eyes: Negative for eye problems.  Respiratory: Negative for chest tightness and cough.   Cardiovascular: Negative for chest pain.  Gastrointestinal: Negative for abdominal distention, abdominal pain and blood in stool.  Endocrine: Negative for hot flashes.  Genitourinary: Negative for difficulty urinating and frequency.   Musculoskeletal: Positive for arthralgias.  Skin: Negative for itching and rash.  Neurological: Positive for numbness. Negative for extremity weakness.  Hematological: Negative for adenopathy.  Psychiatric/Behavioral: Negative for confusion.    MEDICAL HISTORY:  Past Medical History:  Diagnosis Date  . Asthma   . Cancer ([place]) 03/2020   right breast IMC  . COPD exacerbation ([place]) 04/12/2016  . Diabetes mellitus without complication ([place])   . Hyperlipemia   . Hypertension   . Personal history of chemotherapy 2021    SURGICAL HISTORY: Past Surgical History:  Procedure Laterality Date  . BREAST BIOPSY Right 07/05/2019   [place] bx venus marker, grade 3 invasive mammary carcinoma  . BREAST BIOPSY Right 07/05/2019   LN bx, hydromarker,PREDOMINANTLY BLOOD AND FIBROADIPOSE TISSUE, WITH SCANT LYMPHOID TISSUE PRESENT       . BREAST LUMPECTOMY WITH RADIOACTIVE SEED AND SENTINEL LYMPH NODE BIOPSY Right 05/04/2020   Procedure: RIGHT BREAST LUMPECTOMY WITH BRACKETED RADIOACTIVE SEED AND SENTINEL LYMPH NODE BIOPSY;  Surgeon: Jovita Kussmaul, MD;  Location: [facility];  Service: General;  Laterality: Right;  . CESAREAN SECTION    . COLONOSCOPY WITH PROPOFOL N/A 09/11/2015   Procedure: COLONOSCOPY WITH PROPOFOL;  Surgeon: Lucilla Lame, MD;  Location: [facility] ENDOSCOPY;  Service: Endoscopy;  Laterality: N/A;  . IR IMAGING GUIDED PORT INSERTION  07/20/2019    SOCIAL HISTORY: Social History   Socioeconomic History  . Marital status: Married    Spouse name: Not on file  . Number of children: Not on file  . Years of education: Not on file  . Highest education level: Not on file  Occupational History  . Not on file  Tobacco Use  . Smoking status: Former Smoker    Packs/day: 1.00    Years: 2.00    Pack years: 2.00    Types: Cigarettes    Quit date: 02/29/2020    Years since quitting: 0.2  . Smokeless tobacco: Never Used  Substance and Sexual Activity  . Alcohol use: Yes    Comment: social  . Drug use: No  . Sexual activity: Not Currently    Birth control/protection: Post-menopausal  Other Topics Concern  . Not on file  Social History Narrative  . Not on file   Social Determinants of Health   Financial Resource Strain: Not on file  Food Insecurity: Not on file  Transportation Needs: Not on file  Physical Activity: Not on file  Stress: Not on file  Social Connections: Not on file  Intimate Partner Violence: Not on file    FAMILY HISTORY: Family History  Problem Relation Age of Onset  . Diabetes Mother   . Hypertension Mother   . Hypertension Father   . Diabetes Father   . Breast cancer Neg Hx     ALLERGIES:  has No Known Allergies.  MEDICATIONS:  Current Outpatient Medications  Medication Sig Dispense Refill  . Alcohol Swabs (B-D SINGLE USE SWABS REGULAR) PADS Use as directed  twice a day 300 each 3  . amLODipine (NORVASC) 10 MG tablet Take 1/2 tablet po QD 45 tablet 3  . atorvastatin (LIPITOR) 10 MG tablet Take 1 tablet (10 mg total) by mouth daily. 90 tablet 3  . Blood Glucose Monitoring Suppl (TRUE METRIX AIR GLUCOSE METER) w/Device KIT 1 Device by Does not apply route in the morning and at bedtime. Use ad directed e11.65 1 kit 0  . brimonidine (ALPHAGAN) 0.2 % ophthalmic solution Place 1 drop into both eyes 2 (two) times daily.    . Calcium Carb-Cholecalciferol (CALCIUM 600 + D) 600-200 MG-UNIT TABS Take 1 tablet by mouth daily.    Marland Kitchen gabapentin (NEURONTIN) 100 MG capsule Take 1 capsule (100 mg total) by mouth 2 (two) times daily. 180 capsule 3  . glucose blood (TRUE METRIX BLOOD GLUCOSE TEST) test strip Use as instructed twice a daily diag e11.65 100 each 3  . HYDROcodone-acetaminophen (NORCO/VICODIN) 5-325 MG tablet Take 1-2 tablets by mouth every 6 (six) hours as needed for moderate pain or severe pain. 15 tablet 0  . insulin detemir (LEVEMIR FLEXTOUCH) 100 UNIT/ML FlexPen Inject 44 Units into the skin daily. ALONG WITH NEEDLES 15 mL 3  . Insulin Pen Needle (PEN NEEDLES) 31G X 8 MM MISC Use as directed with insulin E11.65 100 each 1  . Lancets Misc. (ACCU-CHEK MULTICLIX LANCET DEV) KIT by Does not apply route. Use as directed twice a day diag E11.65    . lidocaine-prilocaine (EMLA) cream SMARTSIG:1 Topical Every Night    . lisinopril (ZESTRIL) 30 MG tablet Take one tab po qd for HTN 90 tablet 3  . Omega-3 Fatty Acids (FISH OIL) 1000 MG CAPS Take 1,000 mg by mouth daily.     . Semaglutide, 1 MG/DOSE, (OZEMPIC, 1 MG/DOSE,) 2 MG/1.5ML SOPN Inject 1 mg into the skin once a week. FOR DM 6 mL 6  . SIMBRINZA 1-0.2 % SUSP     . TRUEplus Lancets 30G MISC Use as directed twice daily diag e11.65 100 each 3   No current facility-administered medications for this visit.   Facility-Administered Medications Ordered in Other Visits  Medication Dose Route Frequency Provider  Last Rate Last Admin  . sodium chloride flush (NS) 0.9 % injection 10 mL  10 mL Intravenous PRN Earlie Server, MD   10 mL at 10/11/19 0817     PHYSICAL EXAMINATION: ECOG PERFORMANCE STATUS: 1 - Symptomatic but completely ambulatory Vitals:   05/18/20 0941  BP: (!) 151/83  Pulse: 97  Resp: 16  Temp: (!) 96.1 F (35.6 C)   Filed Weights   05/18/20 0941  Weight: 161 lb 6.4 oz (73.2 kg)    Physical Exam Constitutional:      General: She is not in acute distress. HENT:     Head: Normocephalic and atraumatic.  Eyes:     General: No scleral icterus.  Cardiovascular:     Rate and Rhythm: Normal rate and regular rhythm.     Heart sounds: Normal heart sounds.  Pulmonary:     Effort: Pulmonary effort is normal. No respiratory distress.     Breath sounds: Normal breath sounds. No wheezing.  Abdominal:     General: Bowel sounds are normal. There is no distension.     Palpations: Abdomen is soft.  Musculoskeletal:        General: No deformity. Normal range of motion.     Cervical back: Normal range of motion and neck supple.  Skin:    General: Skin is warm and dry.     Findings: No erythema or rash.     Comments: Nail hyperpigmentation changes  Neurological:     Mental Status: She is alert and oriented to person, place, and time. Mental status is at baseline.     Cranial Nerves: No cranial nerve deficit.     Coordination: Coordination normal.  Psychiatric:        Mood and Affect: Mood normal.    LABORATORY DATA:  I have reviewed the data as listed Lab Results  Component Value Date   WBC 7.2 03/22/2020   HGB 11.3 (L) 03/22/2020   HCT 33.0 (L) 03/22/2020   MCV 84.0 03/22/2020   PLT 241 03/22/2020   Recent Labs    12/16/19 0841 12/23/19 0828 01/02/20 0848 03/22/20 0951 05/03/20 1507  NA 136 138 136 139 139  K 3.9 3.8 3.6 4.0 4.7  CL 103 106 106 105 106  CO2 _0 GLUCOSE 154* 162* 190* 79 126*  BUN _1 CREATININE 0.82 0.70 0.82 0.76 1.16*   CALCIUM 9.2 9.1 8.7* 9.4 9.7  GFRNONAA >60 >60 >60 >60 53*  GFRAA >60 >60 >60  --   --   PROT 6.8 6.9 6.6 7.2  --   ALBUMIN 3.7 3.6 3.6 3.8  --   AST _2 --   ALT _3 --   ALKPHOS 33* 31* 39 28*  --   BILITOT 0.4 0.5 0.4 0.5  --    Iron/TIBC/Ferritin/ %Sat No results found for: IRON, TIBC, FERRITIN, IRONPCTSAT    RADIOGRAPHIC STUDIES: I have personally reviewed the radiological images as listed and agreed with the findings in the report. MR BREAST BILATERAL W WO CONTRAST INC CAD  Result Date: 03/16/2020 CLINICAL DATA:  Grade 3 invasive mammary carcinoma diagnosed ultrasound-guided core needle biopsy in the 10-11 o'clock position of the right breast on 07/05/2019. The patient has had subsequent neoadjuvant chemotherapy. On a follow-up right diagnostic mammogram and right breast ultrasound dated 01/17/2020, it was difficult to determine how much residual tumor there was. LABS:  None obtained on site today. EXAM: BILATERAL BREAST MRI WITH AND WITHOUT CONTRAST TECHNIQUE: Multiplanar, multisequence MR images of both breasts were obtained prior to and following the intravenous administration of 7 ml of Gadavist Three-dimensional MR images were rendered by post-processing of the original MR data on an independent workstation. The three-dimensional MR images were interpreted, and findings are reported in the following complete MRI report for this study. Three dimensional images were evaluated at the independent interpreting workstation using the DynaCAD thin client. COMPARISON:  Previous mammogram, ultrasound and biopsy examinations. The patient has not had a previous MRI. FINDINGS: Breast composition: b. Scattered fibroglandular tissue. Background parenchymal enhancement: Minimal Right breast: Biopsy marker clip artifact in the posterior aspect of the upper-outer  quadrant of the right breast at the location previously biopsied malignancy. There is a small amount of linear,  ill-defined, low-grade enhancement extending from that area posteriorly and superiorly toward the right axilla. This area measures 5.2 x 1.1 cm on image number 66 series 16 and 2.0 cm in cephalocaudal dimension. This has persistent enhancement kinetics. No masses or enhancement suspicious for malignancy elsewhere in the right breast. Left breast: No mass or abnormal enhancement. Lymph nodes: No abnormal appearing lymph nodes. The previously biopsied right inferior axillary lymph node has a maximum cortical thickness of 4 mm. This measured 4.4 mm on the ultrasound dated 01/20/2020. This does not show any evidence of malignancy on the biopsy at that time. Ancillary findings:  None. IMPRESSION: 1. The previously biopsied mass in the posterior aspect of the upper-outer quadrant of the right breast is no longer visualized. There is a small amount of low-grade linear enhancement extending posteriorly and superiorly from that location toward the right axilla, measuring 5.2 x 2.0 x 1.1 cm. 2. No evidence of malignancy elsewhere in either breast. 3. No adenopathy. RECOMMENDATION: Treatment plan. BI-RADS CATEGORY  6: Known biopsy-proven malignancy. Electronically Signed   By: Claudie Revering M.D.   On: 03/16/2020 13:24   NM Sentinel Node Inj-No Rpt (Breast)  Result Date: 05/04/2020 Sulfur colloid was injected by the nuclear medicine technologist for melanoma sentinel node.   MM Breast Surgical Specimen  Result Date: 05/04/2020 CLINICAL DATA:  Status post lumpectomy today after earlier radioactive seed localization. EXAM: SPECIMEN RADIOGRAPH OF THE RIGHT BREAST COMPARISON:  Previous exam(s). FINDINGS: Status post excision of the right breast. The radioactive seed and biopsy marker clip are present, completely intact, and were marked for pathology. Findings discussed with the OR staff during the procedure. IMPRESSION: Specimen radiograph of the right breast. Electronically Signed   By: Franki Cabot M.D.   On: 05/04/2020  09:21   MM RT RADIOACTIVE SEED LOC MAMMO GUIDE  Result Date: 05/03/2020 CLINICAL DATA:  Localization prior to surgery EXAM: MAMMOGRAPHIC GUIDED RADIOACTIVE SEED LOCALIZATION OF THE RIGHT BREAST COMPARISON:  Previous exam(s). FINDINGS: Patient presents for radioactive seed localization prior to surgery. I met with the patient and we discussed the procedure of seed localization including benefits and alternatives. We discussed the high likelihood of a successful procedure. We discussed the risks of the procedure including infection, bleeding, tissue injury and further surgery. We discussed the low dose of radioactivity involved in the procedure. Informed, written consent was given. The usual time-out protocol was performed immediately prior to the procedure. Using mammographic guidance, sterile technique, 1% lidocaine and an I-125 radioactive seed, the site of the patient's known malignancy was localized using a lateral approach. The follow-up mammogram images confirm the seed in the expected location and were marked for the surgeon. Follow-up survey of the patient confirms presence of the radioactive seed. Order number of I-125 seed:  [id number]. Total activity:  4.627 millicuries reference Date: March 28, 2020 The patient tolerated the procedure well and was released from the [facility]. She was given instructions regarding seed removal. IMPRESSION: Radioactive seed localization right breast. No apparent complications. Electronically Signed   By: Dorise Bullion III M.D   On: 05/03/2020 14:14      ASSESSMENT & PLAN:  1. Malignant neoplasm of upper-outer quadrant of right breast in female, estrogen receptor negative ([place])   2. 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  ER negative, PR weakly positive (<=10%), HER-2 negative Status post  neoadjuvant dose dense AC followed by 12 weekly Taxol. Status post lumpectomy/sentinel lymph node biopsy ypTis ypN0 No residual  invasive carcinoma. Patient did not get immunotherapy in the neoadjuvant setting. I would not add immunotherapy in the adjuvant setting. I recommend surveillance. Patient is due for bilateral diagnostic mammogram in October 2022.  #Chemotherapy induced neuropathy, grade 1-2.  Continue gabapentin 100 mg 3 times daily refill sent to pharmacy.  #Port-A-Cath  Continue port flush every 8 weeks, schedule 6 flush. Recommend patient to keep Mediport for at least a year, prefer 2 years.   All questions were answered. The patient knows to call the clinic with any problems questions or concerns. Return of visit 3 months  Earlie Server, MD, PhD Hematology Oncology [facility] Pager- [phone] 05/18/2020

## 2020-07-05 ENCOUNTER — Ambulatory Visit: Payer: Medicare HMO | Admitting: Internal Medicine

## 2020-07-06 ENCOUNTER — Encounter: Payer: Self-pay | Admitting: *Deleted

## 2020-07-06 DIAGNOSIS — C50911 Malignant neoplasm of unspecified site of right female breast: Secondary | ICD-10-CM

## 2020-07-06 NOTE — Progress Notes (Addendum)
Called patient and discussed need for Radiation Oncology Consult.  Informed I would call her back with an appointment to see Dr. Baruch Gouty.  She is agreeable.  I called patient back to give her her appointment, but no answer and no voicemail.  I left a message with her daughter Virgilio Belling with her appointment for consult with Dr. Baruch Gouty on 07/12/20 at 11:00.  I did request that Vivian call me back to confirm receipt of the message.

## 2020-07-11 ENCOUNTER — Encounter: Payer: Self-pay | Admitting: Radiation Oncology

## 2020-07-11 DIAGNOSIS — H40003 Preglaucoma, unspecified, bilateral: Secondary | ICD-10-CM | POA: Diagnosis not present

## 2020-07-12 ENCOUNTER — Ambulatory Visit
Admission: RE | Admit: 2020-07-12 | Discharge: 2020-07-12 | Disposition: A | Discharge status: Home / Self Care | Payer: Medicare HMO | Source: Ambulatory Visit | Attending: Radiation Oncology | Admitting: Radiation Oncology

## 2020-07-12 DIAGNOSIS — Z794 Long term (current) use of insulin: Secondary | ICD-10-CM | POA: Insufficient documentation

## 2020-07-12 DIAGNOSIS — E119 Type 2 diabetes mellitus without complications: Secondary | ICD-10-CM | POA: Diagnosis not present

## 2020-07-12 DIAGNOSIS — Z79899 Other long term (current) drug therapy: Secondary | ICD-10-CM | POA: Diagnosis not present

## 2020-07-12 DIAGNOSIS — C50411 Malignant neoplasm of upper-outer quadrant of right female breast: Secondary | ICD-10-CM | POA: Diagnosis not present

## 2020-07-12 DIAGNOSIS — I10 Essential (primary) hypertension: Secondary | ICD-10-CM | POA: Insufficient documentation

## 2020-07-12 DIAGNOSIS — Z9221 Personal history of antineoplastic chemotherapy: Secondary | ICD-10-CM | POA: Insufficient documentation

## 2020-07-12 DIAGNOSIS — E785 Hyperlipidemia, unspecified: Secondary | ICD-10-CM | POA: Insufficient documentation

## 2020-07-12 DIAGNOSIS — J449 Chronic obstructive pulmonary disease, unspecified: Secondary | ICD-10-CM | POA: Insufficient documentation

## 2020-07-12 DIAGNOSIS — Z171 Estrogen receptor negative status [ER-]: Secondary | ICD-10-CM | POA: Insufficient documentation

## 2020-07-12 DIAGNOSIS — Z87891 Personal history of nicotine dependence: Secondary | ICD-10-CM | POA: Diagnosis not present

## 2020-07-12 NOTE — Consult Note (Signed)
NEW PATIENT EVALUATION  Name: Dawn Alvarez  MRN: [id number]  Date:   07/12/2020     DOB: 1956/03/29   This 65 y.o. 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  REFERRING PHYSICIAN: Lavera Guise, MD  CHIEF COMPLAINT:  Chief Complaint  Patient presents with  . Breast Cancer    INitial consultation    DIAGNOSIS: The encounter diagnosis was Malignant neoplasm of upper-outer quadrant of right breast in female, estrogen receptor negative ([place]).   PREVIOUS INVESTIGATIONS:  Mammogram ultrasound reviewed Pathology report reviewed Clinical notes reviewed  HPI: Patient is a 65 year old female who presented with an abnormal mammogram of the right breast.  Mammogram and ultrasound demonstrated a lesion at the 11 o'clock position 10 cm from the nipple measuring approximate 2.8 x 2.6 cm.  Biopsy was positive for MRI scan showed lesion in the upper outer quadrant of the right breast with a low-grade linear enhancement extending posterior superior from that location towards the axilla measuring 5.2 x 2 x 1 cm.  No evidence of adenopathy.  Except for a mildly abnormal right axillary lymph node measuring 4.4 mm.  Ultrasound-guided biopsy the right breast breast mass and right axilla showed invasive mammary carcinoma.  The right axilla lymph node showed predominantly blood in the fatty breast tissue with scant lymphoid tissue present no malignancy was identified.  Patient underwent neoadjuvant chemotherapy with dose dense AC plus weekly Taxol.  Mammogram showed decreased size of cancer at that time.  She underwent wide local excision showing high-grade DCIS with calcifications measuring 1 cm no residual invasive component was seen margins were clear.  5 sentinel lymph nodes all were  negative.  Margins were clear with 2 mm for the DCIS component.  Tumor was ER negative PR borderline positive HER-2/neu not overexpressed.  She has done well postoperatively.  She is now seen for radiation oncology opinion.  PLANNED TREATMENT REGIMEN: Right whole breast hypofractionated course of radiation  PAST MEDICAL HISTORY:  has a past medical history of Asthma, Cancer ([place]) (03/2020), COPD exacerbation ([place]) (04/12/2016), Diabetes mellitus without complication ([place]), Hyperlipemia, Hypertension, and Personal history of chemotherapy (2021).    PAST SURGICAL HISTORY:  Past Surgical History:  Procedure Laterality Date  . BREAST BIOPSY Right 07/05/2019   [place] bx venus marker, grade 3 invasive mammary carcinoma  . BREAST BIOPSY Right 07/05/2019   LN bx, hydromarker,PREDOMINANTLY BLOOD AND FIBROADIPOSE TISSUE, WITH SCANT LYMPHOID TISSUE PRESENT      . BREAST LUMPECTOMY WITH RADIOACTIVE SEED AND SENTINEL LYMPH NODE BIOPSY Right 05/04/2020   Procedure: RIGHT BREAST LUMPECTOMY WITH BRACKETED RADIOACTIVE SEED AND SENTINEL LYMPH NODE BIOPSY;  Surgeon: Jovita Kussmaul, MD;  Location: [facility];  Service: General;  Laterality: Right;  . CESAREAN SECTION    . COLONOSCOPY WITH PROPOFOL N/A 09/11/2015   Procedure: COLONOSCOPY WITH PROPOFOL;  Surgeon: Lucilla Lame, MD;  Location: [facility] ENDOSCOPY;  Service: Endoscopy;  Laterality: N/A;  . IR IMAGING GUIDED PORT INSERTION  07/20/2019    FAMILY HISTORY: family history includes Diabetes in her father and mother; Hypertension in her father and mother.  SOCIAL HISTORY:  reports that she quit smoking about 4 months ago. Her smoking use included cigarettes. She has a 2.00 pack-year smoking history. She has never used smokeless tobacco. She reports current alcohol use. She  reports that she does not use drugs.  ALLERGIES: Patient has no known allergies.  MEDICATIONS:  Current Outpatient Medications  Medication Sig Dispense Refill  . Alcohol Swabs  (B-D SINGLE USE SWABS REGULAR) PADS Use as directed twice a day 300 each 3  . amLODipine (NORVASC) 10 MG tablet Take 1/2 tablet po QD 45 tablet 3  . atorvastatin (LIPITOR) 10 MG tablet Take 1 tablet (10 mg total) by mouth daily. 90 tablet 3  . Blood Glucose Monitoring Suppl (TRUE METRIX AIR GLUCOSE METER) w/Device KIT 1 Device by Does not apply route in the morning and at bedtime. Use ad directed e11.65 1 kit 0  . brimonidine (ALPHAGAN) 0.2 % ophthalmic solution Place 1 drop into both eyes 2 (two) times daily.    . Calcium Carb-Cholecalciferol (CALCIUM 600 + D) 600-200 MG-UNIT TABS Take 1 tablet by mouth daily.    Marland Kitchen gabapentin (NEURONTIN) 100 MG capsule Take 1 capsule (100 mg total) by mouth 2 (two) times daily. 180 capsule 3  . glucose blood (TRUE METRIX BLOOD GLUCOSE TEST) test strip Use as instructed twice a daily diag e11.65 100 each 3  . HYDROcodone-acetaminophen (NORCO/VICODIN) 5-325 MG tablet Take 1-2 tablets by mouth every 6 (six) hours as needed for moderate pain or severe pain. 15 tablet 0  . insulin detemir (LEVEMIR FLEXTOUCH) 100 UNIT/ML FlexPen Inject 44 Units into the skin daily. ALONG WITH NEEDLES 15 mL 3  . Insulin Pen Needle (PEN NEEDLES) 31G X 8 MM MISC Use as directed with insulin E11.65 100 each 1  . Lancets Misc. (ACCU-CHEK MULTICLIX LANCET DEV) KIT by Does not apply route. Use as directed twice a day diag E11.65    . lidocaine-prilocaine (EMLA) cream SMARTSIG:1 Topical Every Night    . lisinopril (ZESTRIL) 30 MG tablet Take one tab po qd for HTN 90 tablet 3  . Omega-3 Fatty Acids (FISH OIL) 1000 MG CAPS Take 1,000 mg by mouth daily.     . Semaglutide, 1 MG/DOSE, (OZEMPIC, 1 MG/DOSE,) 2 MG/1.5ML SOPN Inject 1 mg into the skin once a week. FOR DM 6 mL 6  . SIMBRINZA 1-0.2 % SUSP     . TRUEplus Lancets 30G MISC Use as directed twice daily diag e11.65 100 each 3  . lisinopril (ZESTRIL) 20 MG tablet      No current facility-administered medications for this encounter.    Facility-Administered Medications Ordered in Other Encounters  Medication Dose Route Frequency Provider Last Rate Last Admin  . sodium chloride flush (NS) 0.9 % injection 10 mL  10 mL Intravenous PRN Earlie Server, MD   10 mL at 10/11/19 0817    ECOG PERFORMANCE STATUS:  0 - Asymptomatic  REVIEW OF SYSTEMS: Patient denies any weight loss, fatigue, weakness, fever, chills or night sweats. Patient denies any loss of vision, blurred vision. Patient denies any ringing  of the ears or hearing loss. No irregular heartbeat. Patient denies heart murmur or history of fainting. Patient denies any chest pain or pain radiating to her upper extremities. Patient denies any shortness of breath, difficulty breathing at night, cough or hemoptysis. Patient denies any swelling in the lower legs. Patient denies any nausea vomiting, vomiting of blood, or coffee ground material in the vomitus. Patient denies any stomach pain. Patient states has had normal bowel movements no significant constipation or diarrhea. Patient denies any dysuria, hematuria or significant nocturia. Patient denies any problems walking, swelling in the joints or loss of balance. Patient denies any skin changes, loss of hair  or loss of weight. Patient denies any excessive worrying or anxiety or significant depression. Patient denies any problems with insomnia. Patient denies excessive thirst, polyuria, polydipsia. Patient denies any swollen glands, patient denies easy bruising or easy bleeding. Patient denies any recent infections, allergies or URI. Patient "s visual fields have not changed significantly in recent time.   PHYSICAL EXAM: BP (!) (P) 141/76 (BP Location: Left Arm, Patient Position: Sitting)   Pulse (P) 79   Temp (!) (P) 95.5 F (35.3 C) (Tympanic)   Resp (P) 16   Wt (P) 166 lb 1.6 oz (75.3 kg)   BMI (P) 31.38 kg/m  Patient status post wide local excision of the upper outer quadrant of the right breast and sentinel node biopsy scar  both healed well.  No dominant masses noted in either breast.  No axillary or supraclavicular adenopathy is identified.  Well-developed well-nourished patient in NAD. HEENT reveals PERLA, EOMI, discs not visualized.  Oral cavity is clear. No oral mucosal lesions are identified. Neck is clear without evidence of cervical or supraclavicular adenopathy. Lungs are clear to A&P. Cardiac examination is essentially unremarkable with regular rate and rhythm without murmur rub or thrill. Abdomen is benign with no organomegaly or masses noted. Motor sensory and DTR levels are equal and symmetric in the upper and lower extremities. Cranial nerves II through XII are grossly intact. Proprioception is intact. No peripheral adenopathy or edema is identified. No motor or sensory levels are noted. Crude visual fields are within normal range.  LABORATORY DATA: Pathology report reviewed    RADIOLOGY RESULTS: Mammogram ultrasound and MRI scan of breast all reviewed compatible with above-stated findings   IMPRESSION: Stage IIa initial invasive mammary carcinoma with status post neoadjuvant chemotherapy with no invasive component remaining in 65 year old female  PLAN: At this time would like to go ahead with whole breast radiation to her right breast.  I believe I can try a hypofractionated course of treatment over 3 weeks boosting her scar another 1000 cGy using electron beam based on the close DCIS margin.  Risks and benefits of treatment including skin reaction fatigue alteration of blood counts possible inclusion of superficial lung all were described in detail.  Based on her negative sentinel nodes do not think she needs axillary radiation.  Patient comprehends my recommendations well.  I have personally set up and ordered CT simulation for early next week.  I would like to take this opportunity to thank you for allowing me to participate in the care of your patient.Noreene Filbert, MD

## 2020-07-13 ENCOUNTER — Inpatient Hospital Stay: Payer: Medicare HMO | Attending: Oncology

## 2020-07-17 ENCOUNTER — Ambulatory Visit
Admission: RE | Admit: 2020-07-17 | Discharge: 2020-07-17 | Disposition: A | Discharge status: Home / Self Care | Payer: Medicare HMO | Source: Ambulatory Visit | Attending: Radiation Oncology | Admitting: Radiation Oncology

## 2020-07-17 DIAGNOSIS — C50411 Malignant neoplasm of upper-outer quadrant of right female breast: Secondary | ICD-10-CM | POA: Insufficient documentation

## 2020-07-17 DIAGNOSIS — Z171 Estrogen receptor negative status [ER-]: Secondary | ICD-10-CM | POA: Diagnosis not present

## 2020-07-17 DIAGNOSIS — Z510 Encounter for antineoplastic radiation therapy: Secondary | ICD-10-CM | POA: Diagnosis not present

## 2020-07-19 DIAGNOSIS — Z510 Encounter for antineoplastic radiation therapy: Secondary | ICD-10-CM | POA: Diagnosis not present

## 2020-07-19 DIAGNOSIS — C50411 Malignant neoplasm of upper-outer quadrant of right female breast: Secondary | ICD-10-CM | POA: Diagnosis not present

## 2020-07-19 DIAGNOSIS — Z171 Estrogen receptor negative status [ER-]: Secondary | ICD-10-CM | POA: Diagnosis not present

## 2020-07-20 ENCOUNTER — Other Ambulatory Visit: Payer: Self-pay | Admitting: *Deleted

## 2020-07-20 DIAGNOSIS — C50411 Malignant neoplasm of upper-outer quadrant of right female breast: Secondary | ICD-10-CM

## 2020-07-20 DIAGNOSIS — Z171 Estrogen receptor negative status [ER-]: Secondary | ICD-10-CM

## 2020-07-24 ENCOUNTER — Ambulatory Visit: Admission: RE | Admit: 2020-07-24 | Payer: Medicare HMO | Source: Ambulatory Visit

## 2020-07-24 DIAGNOSIS — Z171 Estrogen receptor negative status [ER-]: Secondary | ICD-10-CM | POA: Diagnosis not present

## 2020-07-24 DIAGNOSIS — Z510 Encounter for antineoplastic radiation therapy: Secondary | ICD-10-CM | POA: Diagnosis not present

## 2020-07-24 DIAGNOSIS — C50411 Malignant neoplasm of upper-outer quadrant of right female breast: Secondary | ICD-10-CM | POA: Diagnosis not present

## 2020-07-25 ENCOUNTER — Ambulatory Visit
Admission: RE | Admit: 2020-07-25 | Discharge: 2020-07-25 | Disposition: A | Discharge status: Home / Self Care | Payer: Medicare HMO | Source: Ambulatory Visit | Attending: Radiation Oncology | Admitting: Radiation Oncology

## 2020-07-25 DIAGNOSIS — C50411 Malignant neoplasm of upper-outer quadrant of right female breast: Secondary | ICD-10-CM | POA: Diagnosis not present

## 2020-07-25 DIAGNOSIS — Z510 Encounter for antineoplastic radiation therapy: Secondary | ICD-10-CM | POA: Diagnosis not present

## 2020-07-25 DIAGNOSIS — Z171 Estrogen receptor negative status [ER-]: Secondary | ICD-10-CM | POA: Diagnosis not present

## 2020-07-26 ENCOUNTER — Ambulatory Visit
Admission: RE | Admit: 2020-07-26 | Discharge: 2020-07-26 | Disposition: A | Discharge status: Home / Self Care | Payer: Medicare HMO | Source: Ambulatory Visit | Attending: Radiation Oncology | Admitting: Radiation Oncology

## 2020-07-26 DIAGNOSIS — Z171 Estrogen receptor negative status [ER-]: Secondary | ICD-10-CM | POA: Diagnosis not present

## 2020-07-26 DIAGNOSIS — Z510 Encounter for antineoplastic radiation therapy: Secondary | ICD-10-CM | POA: Diagnosis not present

## 2020-07-26 DIAGNOSIS — C50411 Malignant neoplasm of upper-outer quadrant of right female breast: Secondary | ICD-10-CM | POA: Diagnosis not present

## 2020-07-27 ENCOUNTER — Ambulatory Visit
Admission: RE | Admit: 2020-07-27 | Discharge: 2020-07-27 | Disposition: A | Discharge status: Home / Self Care | Payer: Medicare HMO | Source: Ambulatory Visit | Attending: Radiation Oncology | Admitting: Radiation Oncology

## 2020-07-27 DIAGNOSIS — Z171 Estrogen receptor negative status [ER-]: Secondary | ICD-10-CM | POA: Diagnosis not present

## 2020-07-27 DIAGNOSIS — C50411 Malignant neoplasm of upper-outer quadrant of right female breast: Secondary | ICD-10-CM | POA: Diagnosis not present

## 2020-07-27 DIAGNOSIS — Z510 Encounter for antineoplastic radiation therapy: Secondary | ICD-10-CM | POA: Diagnosis not present

## 2020-07-30 ENCOUNTER — Ambulatory Visit
Admission: RE | Admit: 2020-07-30 | Discharge: 2020-07-30 | Disposition: A | Discharge status: Home / Self Care | Payer: Medicare HMO | Source: Ambulatory Visit | Attending: Radiation Oncology | Admitting: Radiation Oncology

## 2020-07-30 DIAGNOSIS — Z171 Estrogen receptor negative status [ER-]: Secondary | ICD-10-CM | POA: Diagnosis not present

## 2020-07-30 DIAGNOSIS — Z510 Encounter for antineoplastic radiation therapy: Secondary | ICD-10-CM | POA: Diagnosis not present

## 2020-07-30 DIAGNOSIS — C50411 Malignant neoplasm of upper-outer quadrant of right female breast: Secondary | ICD-10-CM | POA: Diagnosis not present

## 2020-07-31 ENCOUNTER — Ambulatory Visit
Admission: RE | Admit: 2020-07-31 | Discharge: 2020-07-31 | Disposition: A | Discharge status: Home / Self Care | Payer: Medicare HMO | Source: Ambulatory Visit | Attending: Radiation Oncology | Admitting: Radiation Oncology

## 2020-07-31 DIAGNOSIS — Z510 Encounter for antineoplastic radiation therapy: Secondary | ICD-10-CM | POA: Diagnosis not present

## 2020-07-31 DIAGNOSIS — C50411 Malignant neoplasm of upper-outer quadrant of right female breast: Secondary | ICD-10-CM | POA: Insufficient documentation

## 2020-07-31 DIAGNOSIS — Z171 Estrogen receptor negative status [ER-]: Secondary | ICD-10-CM | POA: Insufficient documentation

## 2020-08-01 ENCOUNTER — Ambulatory Visit
Admission: RE | Admit: 2020-08-01 | Discharge: 2020-08-01 | Disposition: A | Discharge status: Home / Self Care | Payer: Medicare HMO | Source: Ambulatory Visit | Attending: Radiation Oncology | Admitting: Radiation Oncology

## 2020-08-01 DIAGNOSIS — Z510 Encounter for antineoplastic radiation therapy: Secondary | ICD-10-CM | POA: Diagnosis not present

## 2020-08-01 DIAGNOSIS — C50411 Malignant neoplasm of upper-outer quadrant of right female breast: Secondary | ICD-10-CM | POA: Diagnosis not present

## 2020-08-01 DIAGNOSIS — Z171 Estrogen receptor negative status [ER-]: Secondary | ICD-10-CM | POA: Diagnosis not present

## 2020-08-02 ENCOUNTER — Ambulatory Visit
Admission: RE | Admit: 2020-08-02 | Discharge: 2020-08-02 | Disposition: A | Discharge status: Home / Self Care | Payer: Medicare HMO | Source: Ambulatory Visit | Attending: Radiation Oncology | Admitting: Radiation Oncology

## 2020-08-02 DIAGNOSIS — C50411 Malignant neoplasm of upper-outer quadrant of right female breast: Secondary | ICD-10-CM | POA: Diagnosis not present

## 2020-08-02 DIAGNOSIS — Z171 Estrogen receptor negative status [ER-]: Secondary | ICD-10-CM | POA: Diagnosis not present

## 2020-08-02 DIAGNOSIS — Z510 Encounter for antineoplastic radiation therapy: Secondary | ICD-10-CM | POA: Diagnosis not present

## 2020-08-03 ENCOUNTER — Ambulatory Visit
Admission: RE | Admit: 2020-08-03 | Discharge: 2020-08-03 | Disposition: A | Discharge status: Home / Self Care | Payer: Medicare HMO | Source: Ambulatory Visit | Attending: Radiation Oncology | Admitting: Radiation Oncology

## 2020-08-03 DIAGNOSIS — Z171 Estrogen receptor negative status [ER-]: Secondary | ICD-10-CM | POA: Diagnosis not present

## 2020-08-03 DIAGNOSIS — C50411 Malignant neoplasm of upper-outer quadrant of right female breast: Secondary | ICD-10-CM | POA: Diagnosis not present

## 2020-08-03 DIAGNOSIS — Z510 Encounter for antineoplastic radiation therapy: Secondary | ICD-10-CM | POA: Diagnosis not present

## 2020-08-06 ENCOUNTER — Ambulatory Visit
Admission: RE | Admit: 2020-08-06 | Discharge: 2020-08-06 | Disposition: A | Discharge status: Home / Self Care | Payer: Medicare HMO | Source: Ambulatory Visit | Attending: Radiation Oncology | Admitting: Radiation Oncology

## 2020-08-06 DIAGNOSIS — Z171 Estrogen receptor negative status [ER-]: Secondary | ICD-10-CM | POA: Diagnosis not present

## 2020-08-06 DIAGNOSIS — Z510 Encounter for antineoplastic radiation therapy: Secondary | ICD-10-CM | POA: Diagnosis not present

## 2020-08-06 DIAGNOSIS — C50411 Malignant neoplasm of upper-outer quadrant of right female breast: Secondary | ICD-10-CM | POA: Diagnosis not present

## 2020-08-07 ENCOUNTER — Ambulatory Visit
Admission: RE | Admit: 2020-08-07 | Discharge: 2020-08-07 | Disposition: A | Discharge status: Home / Self Care | Payer: Medicare HMO | Source: Ambulatory Visit | Attending: Radiation Oncology | Admitting: Radiation Oncology

## 2020-08-07 DIAGNOSIS — Z510 Encounter for antineoplastic radiation therapy: Secondary | ICD-10-CM | POA: Diagnosis not present

## 2020-08-07 DIAGNOSIS — Z171 Estrogen receptor negative status [ER-]: Secondary | ICD-10-CM | POA: Diagnosis not present

## 2020-08-07 DIAGNOSIS — C50411 Malignant neoplasm of upper-outer quadrant of right female breast: Secondary | ICD-10-CM | POA: Diagnosis not present

## 2020-08-08 ENCOUNTER — Ambulatory Visit
Admission: RE | Admit: 2020-08-08 | Discharge: 2020-08-08 | Disposition: A | Discharge status: Home / Self Care | Payer: Medicare HMO | Source: Ambulatory Visit | Attending: Radiation Oncology | Admitting: Radiation Oncology

## 2020-08-08 DIAGNOSIS — G620 Drug-induced polyneuropathy: Secondary | ICD-10-CM | POA: Diagnosis not present

## 2020-08-08 DIAGNOSIS — Z87891 Personal history of nicotine dependence: Secondary | ICD-10-CM | POA: Diagnosis not present

## 2020-08-08 DIAGNOSIS — Z510 Encounter for antineoplastic radiation therapy: Secondary | ICD-10-CM | POA: Diagnosis not present

## 2020-08-08 DIAGNOSIS — C50411 Malignant neoplasm of upper-outer quadrant of right female breast: Secondary | ICD-10-CM | POA: Diagnosis not present

## 2020-08-08 DIAGNOSIS — Z79899 Other long term (current) drug therapy: Secondary | ICD-10-CM | POA: Diagnosis not present

## 2020-08-08 DIAGNOSIS — Z171 Estrogen receptor negative status [ER-]: Secondary | ICD-10-CM | POA: Diagnosis not present

## 2020-08-09 ENCOUNTER — Inpatient Hospital Stay: Payer: Medicare HMO | Attending: Oncology

## 2020-08-09 ENCOUNTER — Ambulatory Visit
Admission: RE | Admit: 2020-08-09 | Discharge: 2020-08-09 | Disposition: A | Discharge status: Home / Self Care | Payer: Medicare HMO | Source: Ambulatory Visit | Attending: Radiation Oncology | Admitting: Radiation Oncology

## 2020-08-09 DIAGNOSIS — Z171 Estrogen receptor negative status [ER-]: Secondary | ICD-10-CM | POA: Diagnosis not present

## 2020-08-09 DIAGNOSIS — C50411 Malignant neoplasm of upper-outer quadrant of right female breast: Secondary | ICD-10-CM | POA: Diagnosis not present

## 2020-08-09 DIAGNOSIS — Z87891 Personal history of nicotine dependence: Secondary | ICD-10-CM | POA: Insufficient documentation

## 2020-08-09 DIAGNOSIS — Z79899 Other long term (current) drug therapy: Secondary | ICD-10-CM | POA: Diagnosis not present

## 2020-08-09 DIAGNOSIS — G620 Drug-induced polyneuropathy: Secondary | ICD-10-CM | POA: Diagnosis not present

## 2020-08-09 DIAGNOSIS — Z510 Encounter for antineoplastic radiation therapy: Secondary | ICD-10-CM | POA: Diagnosis not present

## 2020-08-09 LAB — CBC
HCT: 33.8 % — ABNORMAL LOW (ref 36.0–46.0)
Hemoglobin: 11.3 g/dL — ABNORMAL LOW (ref 12.0–15.0)
MCH: 28.7 pg (ref 26.0–34.0)
MCHC: 33.4 g/dL (ref 30.0–36.0)
MCV: 85.8 fL (ref 80.0–100.0)
Platelets: 204 10*3/uL (ref 150–400)
RBC: 3.94 MIL/uL (ref 3.87–5.11)
RDW: 15.2 % (ref 11.5–15.5)
WBC: 5.6 10*3/uL (ref 4.0–10.5)
nRBC: 0 % (ref 0.0–0.2)

## 2020-08-10 ENCOUNTER — Ambulatory Visit
Admission: RE | Admit: 2020-08-10 | Discharge: 2020-08-10 | Disposition: A | Discharge status: Home / Self Care | Payer: Medicare HMO | Source: Ambulatory Visit | Attending: Radiation Oncology | Admitting: Radiation Oncology

## 2020-08-10 DIAGNOSIS — Z510 Encounter for antineoplastic radiation therapy: Secondary | ICD-10-CM | POA: Diagnosis not present

## 2020-08-10 DIAGNOSIS — Z171 Estrogen receptor negative status [ER-]: Secondary | ICD-10-CM | POA: Diagnosis not present

## 2020-08-10 DIAGNOSIS — C50411 Malignant neoplasm of upper-outer quadrant of right female breast: Secondary | ICD-10-CM | POA: Diagnosis not present

## 2020-08-13 ENCOUNTER — Ambulatory Visit
Admission: RE | Admit: 2020-08-13 | Discharge: 2020-08-13 | Disposition: A | Discharge status: Home / Self Care | Payer: Medicare HMO | Source: Ambulatory Visit | Attending: Radiation Oncology | Admitting: Radiation Oncology

## 2020-08-13 DIAGNOSIS — Z171 Estrogen receptor negative status [ER-]: Secondary | ICD-10-CM | POA: Diagnosis not present

## 2020-08-13 DIAGNOSIS — C50411 Malignant neoplasm of upper-outer quadrant of right female breast: Secondary | ICD-10-CM | POA: Diagnosis not present

## 2020-08-13 DIAGNOSIS — Z510 Encounter for antineoplastic radiation therapy: Secondary | ICD-10-CM | POA: Diagnosis not present

## 2020-08-14 ENCOUNTER — Ambulatory Visit: Payer: Medicare HMO

## 2020-08-15 ENCOUNTER — Ambulatory Visit
Admission: RE | Admit: 2020-08-15 | Discharge: 2020-08-15 | Disposition: A | Discharge status: Home / Self Care | Payer: Medicare HMO | Source: Ambulatory Visit | Attending: Radiation Oncology | Admitting: Radiation Oncology

## 2020-08-15 DIAGNOSIS — Z510 Encounter for antineoplastic radiation therapy: Secondary | ICD-10-CM | POA: Diagnosis not present

## 2020-08-15 DIAGNOSIS — C50411 Malignant neoplasm of upper-outer quadrant of right female breast: Secondary | ICD-10-CM | POA: Diagnosis not present

## 2020-08-15 DIAGNOSIS — Z171 Estrogen receptor negative status [ER-]: Secondary | ICD-10-CM | POA: Diagnosis not present

## 2020-08-16 ENCOUNTER — Ambulatory Visit: Payer: Medicare HMO

## 2020-08-16 ENCOUNTER — Ambulatory Visit
Admission: RE | Admit: 2020-08-16 | Discharge: 2020-08-16 | Disposition: A | Discharge status: Home / Self Care | Payer: Medicare HMO | Source: Ambulatory Visit | Attending: Radiation Oncology | Admitting: Radiation Oncology

## 2020-08-16 DIAGNOSIS — Z171 Estrogen receptor negative status [ER-]: Secondary | ICD-10-CM | POA: Diagnosis not present

## 2020-08-16 DIAGNOSIS — C50411 Malignant neoplasm of upper-outer quadrant of right female breast: Secondary | ICD-10-CM | POA: Diagnosis not present

## 2020-08-16 DIAGNOSIS — Z510 Encounter for antineoplastic radiation therapy: Secondary | ICD-10-CM | POA: Diagnosis not present

## 2020-08-17 ENCOUNTER — Ambulatory Visit: Payer: Medicare HMO

## 2020-08-17 ENCOUNTER — Ambulatory Visit
Admission: RE | Admit: 2020-08-17 | Discharge: 2020-08-17 | Disposition: A | Discharge status: Home / Self Care | Payer: Medicare HMO | Source: Ambulatory Visit | Attending: Radiation Oncology | Admitting: Radiation Oncology

## 2020-08-17 DIAGNOSIS — Z510 Encounter for antineoplastic radiation therapy: Secondary | ICD-10-CM | POA: Diagnosis not present

## 2020-08-17 DIAGNOSIS — C50411 Malignant neoplasm of upper-outer quadrant of right female breast: Secondary | ICD-10-CM | POA: Diagnosis not present

## 2020-08-17 DIAGNOSIS — Z171 Estrogen receptor negative status [ER-]: Secondary | ICD-10-CM | POA: Diagnosis not present

## 2020-08-20 ENCOUNTER — Inpatient Hospital Stay: Payer: Medicare HMO

## 2020-08-20 ENCOUNTER — Ambulatory Visit
Admission: RE | Admit: 2020-08-20 | Discharge: 2020-08-20 | Disposition: A | Discharge status: Home / Self Care | Payer: Medicare HMO | Source: Ambulatory Visit | Attending: Radiation Oncology | Admitting: Radiation Oncology

## 2020-08-20 DIAGNOSIS — C50411 Malignant neoplasm of upper-outer quadrant of right female breast: Secondary | ICD-10-CM | POA: Diagnosis not present

## 2020-08-20 DIAGNOSIS — Z171 Estrogen receptor negative status [ER-]: Secondary | ICD-10-CM | POA: Diagnosis not present

## 2020-08-20 DIAGNOSIS — Z510 Encounter for antineoplastic radiation therapy: Secondary | ICD-10-CM | POA: Diagnosis not present

## 2020-08-21 ENCOUNTER — Ambulatory Visit
Admission: RE | Admit: 2020-08-21 | Discharge: 2020-08-21 | Disposition: A | Discharge status: Home / Self Care | Payer: Medicare HMO | Source: Ambulatory Visit | Attending: Radiation Oncology | Admitting: Radiation Oncology

## 2020-08-21 DIAGNOSIS — Z171 Estrogen receptor negative status [ER-]: Secondary | ICD-10-CM | POA: Diagnosis not present

## 2020-08-21 DIAGNOSIS — C50411 Malignant neoplasm of upper-outer quadrant of right female breast: Secondary | ICD-10-CM | POA: Diagnosis not present

## 2020-08-21 DIAGNOSIS — Z510 Encounter for antineoplastic radiation therapy: Secondary | ICD-10-CM | POA: Diagnosis not present

## 2020-08-22 ENCOUNTER — Other Ambulatory Visit: Payer: Self-pay

## 2020-08-22 ENCOUNTER — Inpatient Hospital Stay (HOSPITAL_BASED_OUTPATIENT_CLINIC_OR_DEPARTMENT_OTHER): Payer: Medicare HMO | Admitting: Oncology

## 2020-08-22 ENCOUNTER — Encounter: Payer: Self-pay | Admitting: Oncology

## 2020-08-22 ENCOUNTER — Ambulatory Visit
Admission: RE | Admit: 2020-08-22 | Discharge: 2020-08-22 | Disposition: A | Discharge status: Home / Self Care | Payer: Medicare HMO | Source: Ambulatory Visit | Attending: Radiation Oncology | Admitting: Radiation Oncology

## 2020-08-22 ENCOUNTER — Ambulatory Visit: Payer: Medicare HMO

## 2020-08-22 ENCOUNTER — Inpatient Hospital Stay: Payer: Medicare HMO

## 2020-08-22 VITALS — BP 159/90 | HR 82 | Temp 96.5°F | Resp 16 | Wt 166.8 lb

## 2020-08-22 DIAGNOSIS — C50411 Malignant neoplasm of upper-outer quadrant of right female breast: Secondary | ICD-10-CM | POA: Diagnosis not present

## 2020-08-22 DIAGNOSIS — Z95828 Presence of other vascular implants and grafts: Secondary | ICD-10-CM

## 2020-08-22 DIAGNOSIS — Z510 Encounter for antineoplastic radiation therapy: Secondary | ICD-10-CM | POA: Diagnosis not present

## 2020-08-22 DIAGNOSIS — Z171 Estrogen receptor negative status [ER-]: Secondary | ICD-10-CM | POA: Diagnosis not present

## 2020-08-22 DIAGNOSIS — Z79899 Other long term (current) drug therapy: Secondary | ICD-10-CM | POA: Diagnosis not present

## 2020-08-22 DIAGNOSIS — T451X5A Adverse effect of antineoplastic and immunosuppressive drugs, initial encounter: Secondary | ICD-10-CM

## 2020-08-22 DIAGNOSIS — Z87891 Personal history of nicotine dependence: Secondary | ICD-10-CM | POA: Diagnosis not present

## 2020-08-22 DIAGNOSIS — G620 Drug-induced polyneuropathy: Secondary | ICD-10-CM

## 2020-08-22 MED ORDER — GABAPENTIN 100 MG PO CAPS: 100.0000 mg | ORAL_CAPSULE | Freq: Three times a day (TID) | ORAL | 1 refills | Status: DC

## 2020-08-22 MED ORDER — HEPARIN SOD (PORK) LOCK FLUSH 100 UNIT/ML IV SOLN
500.0000 [IU] | Freq: Once | INTRAVENOUS | Status: AC
  Administered 2020-08-22: 500 [IU] via INTRAVENOUS
  Filled 2020-08-22: qty 5

## 2020-08-22 MED ORDER — SODIUM CHLORIDE 0.9% FLUSH
10.0000 mL | INTRAVENOUS | Status: DC | PRN
  Administered 2020-08-22: 10 mL via INTRAVENOUS
  Filled 2020-08-22: qty 10

## 2020-08-22 NOTE — Progress Notes (Signed)
Neuropathy in feet all the time and occasional neuropathy in hands is stable.

## 2020-08-22 NOTE — Progress Notes (Signed)
Hematology/Oncology follow up note [facility] Telephone:([phone] Fax:([phone]   Patient Care Team: Lavera Guise, MD as PCP - General (Internal Medicine) Kate Sable, MD as PCP - Cardiology (Cardiology) Rico Junker, RN as Registered Nurse Theodore Demark, RN as Registered Nurse Earlie Server, MD as Consulting Physician (Oncology) Rico Junker, RN as Registered Nurse  REFERRING PROVIDER: Lavera Guise, MD  CHIEF COMPLAINTS/REASON FOR VISIT:  Follow-up for breast cancer  HISTORY OF PRESENTING ILLNESS:   Dawn Alvarez is a  65 y.o.  female with PMH listed below was seen in consultation at the request of  Lavera Guise, MD  for evaluation of breast cancer Patient had screening mammogram done 08/12/2017 which showed right breast asymmetry.  A diagnostic mammogram was suggested and patient did not have it done. Patient felt her right breast mass for a few weeks.  She had bilateral diagnostic mammogram done on 06/30/2019. 2.8 x 2.4 x 2.6 cm right breast mass, 11:00, 10 cm from the nipple.  There is a single mildly abnormal node in the right axilla with a cortex measuring up to 4.4 mm.  No other suspicious findings. Patient underwent ultrasound-guided core biopsy of the right breast mass and right axilla lymph node Pathology showed invasive mammary carcinoma, no special type, grade 3, ER/PR HER-2 status are pending. Right axillary lymph node biopsy showed predominantly blood in the fibroadipose tissue, with scant lymphoid tissue present.  No definite malignancy was identified.  Patient was referred to cancer center to establish care and discuss treatment plan. Menarche 40 Postmenopausal.  LMP when she was 65 years old. She recalls use of birth control pills. Denies any hormone .  Replacement therapy. Denies any prior chest radiation. She reports family history of maternal grandmother and 2 maternal cousins were diagnosed with cancer.  She does  not know about details. # 09/03/2019, patient had an episode of syncope and EMS was called and patient sent to emergency room.  Patient was noted to have a heart rate 45-65 and blood glucose level of 286. Patient denies any history of nausea, vomiting, diarrhea.  Denies any dehydration.  She reported history of intermittent abdominal pain. CT head without contrast showed no acute intracranial abnormality.  CT abdomen pelvis with contrast is negative for evidence of acute abdominal abnormality.  Patient was given IV fluid. Patient was observed with holding her blood pressure medication including amlodipine, bisoprolol.  No arrhythmia was noted on telemetry.  Echocardiogram showed LVEF 60 to 65%.  No valvular abnormalities.  Beta-blocker and diuretics was discontinued.  # Patient was seen by cardiology Dr. Garen Lah for evaluation of syncope and was cleared for chemotherapy. # 10/03/2019, interval unilateral right diagnostic mammogram showed right breast mass 11:00 size has decreased to 3.7 x 1.3 x 2.6 cm.  Previously mass measured 2.8 x 2.4 x 2.6 cm  Neoadjuvant chemotherapy  08/04/2019-09/21/2019 dose dense AC 10/11/2019-01/02/2020 weekly Taxol mammogram after chemotherapy showed decreased size of cancer at that time and was sent back to surgeon for surgery. Patient no showed to Dr. Ethlyn Gallery office in September 2021  03/16/2020 bilateral breast MRI with and without contrast showed previously biopsied mass in the posterior aspect of the upper outer quadrant of the right breast is no longer visualized.  There is a small amount of low-grade linear enhancement extending posteriorly and superiorly from the location towards the right axilla.  Measuring 5.2 x 2.0 x 1.1 cm.  No evidence of malignancy elsewhere in either breast.  No adenopathy.  11/03/2019 genetic testing is negative   # 05/04/2020 right lumpectomy SLNB ypTis ypN0 High-grade DCIS with calcifications, 1 cm, no residual invasive carcinoma.  Margins not  involved.  Extensive fibrosis with patchy mild chronic inflammation.  Patient had 5 sentinel lymph node excision and all lymph nodes were negative. Margins were negative  #Adjuvant radiation INTERVAL HISTORY Dawn Alvarez is a 65 y.o. female who has above history reviewed by me today presents for follow up visit for management of breast cancer. Problems and complaints are listed below: Patient reports tolerating chemotherapy well. Patient is still undergoing adjuvant radiation.  She tolerates well. Last radiation is on 08/23/2020. For chemotherapy-induced neuropathy, on her fingertips and toes, mainly numbness, sometimes burning.  Patient is taking gabapentin 100 mg once daily with some relief of her symptoms. . Review of Systems  Constitutional: Negative for appetite change, chills, fatigue and fever.  HENT:   Negative for hearing loss and voice change.   Eyes: Negative for eye problems.  Respiratory: Negative for chest tightness and cough.   Cardiovascular: Negative for chest pain.  Gastrointestinal: Negative for abdominal distention, abdominal pain and blood in stool.  Endocrine: Negative for hot flashes.  Genitourinary: Negative for difficulty urinating and frequency.   Musculoskeletal: Positive for arthralgias.  Skin: Negative for itching and rash.  Neurological: Positive for numbness. Negative for extremity weakness.  Hematological: Negative for adenopathy.  Psychiatric/Behavioral: Negative for confusion.    MEDICAL HISTORY:  Past Medical History:  Diagnosis Date  . Asthma   . Cancer ([place]) 03/2020   right breast IMC  . COPD exacerbation ([place]) 04/12/2016  . Diabetes mellitus without complication ([place])   . Hyperlipemia   . Hypertension   . Personal history of chemotherapy 2021    SURGICAL HISTORY: Past Surgical History:  Procedure Laterality Date  . BREAST BIOPSY Right 07/05/2019   [place] bx venus marker, grade 3 invasive mammary carcinoma  . BREAST BIOPSY Right  07/05/2019   LN bx, hydromarker,PREDOMINANTLY BLOOD AND FIBROADIPOSE TISSUE, WITH SCANT LYMPHOID TISSUE PRESENT      . BREAST LUMPECTOMY WITH RADIOACTIVE SEED AND SENTINEL LYMPH NODE BIOPSY Right 05/04/2020   Procedure: RIGHT BREAST LUMPECTOMY WITH BRACKETED RADIOACTIVE SEED AND SENTINEL LYMPH NODE BIOPSY;  Surgeon: Jovita Kussmaul, MD;  Location: [facility];  Service: General;  Laterality: Right;  . CESAREAN SECTION    . COLONOSCOPY WITH PROPOFOL N/A 09/11/2015   Procedure: COLONOSCOPY WITH PROPOFOL;  Surgeon: Lucilla Lame, MD;  Location: [facility] ENDOSCOPY;  Service: Endoscopy;  Laterality: N/A;  . IR IMAGING GUIDED PORT INSERTION  07/20/2019    SOCIAL HISTORY: Social History   Socioeconomic History  . Marital status: Married    Spouse name: Not on file  . Number of children: Not on file  . Years of education: Not on file  . Highest education level: Not on file  Occupational History  . Not on file  Tobacco Use  . Smoking status: Former Smoker    Packs/day: 1.00    Years: 2.00    Pack years: 2.00    Types: Cigarettes    Quit date: 02/29/2020    Years since quitting: 0.4  . Smokeless tobacco: Never Used  Substance and Sexual Activity  . Alcohol use: Yes    Comment: social  . Drug use: No  . Sexual activity: Not Currently    Birth control/protection: Post-menopausal  Other Topics Concern  . Not on file  Social History Narrative  . Not on file  Social Determinants of Health   Financial Resource Strain: Not on file  Food Insecurity: Not on file  Transportation Needs: Not on file  Physical Activity: Not on file  Stress: Not on file  Social Connections: Not on file  Intimate Partner Violence: Not on file    FAMILY HISTORY: Family History  Problem Relation Age of Onset  . Diabetes Mother   . Hypertension Mother   . Hypertension Father   . Diabetes Father   . Breast cancer Neg Hx     ALLERGIES:  has No Known Allergies.  MEDICATIONS:  Current Outpatient  Medications  Medication Sig Dispense Refill  . Alcohol Swabs (B-D SINGLE USE SWABS REGULAR) PADS Use as directed twice a day 300 each 3  . amLODipine (NORVASC) 10 MG tablet Take 1/2 tablet po QD 45 tablet 3  . atorvastatin (LIPITOR) 10 MG tablet Take 1 tablet (10 mg total) by mouth daily. 90 tablet 3  . Blood Glucose Monitoring Suppl (TRUE METRIX AIR GLUCOSE METER) w/Device KIT 1 Device by Does not apply route in the morning and at bedtime. Use ad directed e11.65 1 kit 0  . brimonidine (ALPHAGAN) 0.2 % ophthalmic solution Place 1 drop into both eyes 2 (two) times daily.    . Calcium Carb-Cholecalciferol (CALCIUM 600 + D) 600-200 MG-UNIT TABS Take 1 tablet by mouth daily.    Marland Kitchen glucose blood (TRUE METRIX BLOOD GLUCOSE TEST) test strip Use as instructed twice a daily diag e11.65 100 each 3  . HYDROcodone-acetaminophen (NORCO/VICODIN) 5-325 MG tablet Take 1-2 tablets by mouth every 6 (six) hours as needed for moderate pain or severe pain. 15 tablet 0  . insulin detemir (LEVEMIR FLEXTOUCH) 100 UNIT/ML FlexPen Inject 44 Units into the skin daily. ALONG WITH NEEDLES 15 mL 3  . Insulin Pen Needle (PEN NEEDLES) 31G X 8 MM MISC Use as directed with insulin E11.65 100 each 1  . Lancets Misc. (ACCU-CHEK MULTICLIX LANCET DEV) KIT by Does not apply route. Use as directed twice a day diag E11.65    . lidocaine-prilocaine (EMLA) cream SMARTSIG:1 Topical Every Night    . lisinopril (ZESTRIL) 30 MG tablet Take one tab po qd for HTN 90 tablet 3  . Omega-3 Fatty Acids (FISH OIL) 1000 MG CAPS Take 1,000 mg by mouth daily.     . Semaglutide, 1 MG/DOSE, (OZEMPIC, 1 MG/DOSE,) 2 MG/1.5ML SOPN Inject 1 mg into the skin once a week. FOR DM 6 mL 6  . SIMBRINZA 1-0.2 % SUSP     . TRUEplus Lancets 30G MISC Use as directed twice daily diag e11.65 100 each 3  . gabapentin (NEURONTIN) 100 MG capsule Take 1 capsule (100 mg total) by mouth 3 (three) times daily. 270 capsule 1  . lisinopril (ZESTRIL) 20 MG tablet  (Patient not  taking: Reported on 08/22/2020)     No current facility-administered medications for this visit.   Facility-Administered Medications Ordered in Other Visits  Medication Dose Route Frequency Provider Last Rate Last Admin  . sodium chloride flush (NS) 0.9 % injection 10 mL  10 mL Intravenous PRN Earlie Server, MD   10 mL at 10/11/19 0817  . sodium chloride flush (NS) 0.9 % injection 10 mL  10 mL Intravenous PRN Earlie Server, MD   10 mL at 08/22/20 1115     PHYSICAL EXAMINATION: ECOG PERFORMANCE STATUS: 1 - Symptomatic but completely ambulatory Vitals:   08/22/20 1045  BP: (!) 159/90  Pulse: 82  Resp: 16  Temp: (!) 96.5 F (  35.8 C)   Filed Weights   08/22/20 1045  Weight: 166 lb 12.8 oz (75.7 kg)    Physical Exam Constitutional:      General: She is not in acute distress. HENT:     Head: Normocephalic and atraumatic.  Eyes:     General: No scleral icterus. Cardiovascular:     Rate and Rhythm: Normal rate and regular rhythm.     Heart sounds: Normal heart sounds.  Pulmonary:     Effort: Pulmonary effort is normal. No respiratory distress.     Breath sounds: Normal breath sounds. No wheezing.  Abdominal:     General: Bowel sounds are normal. There is no distension.     Palpations: Abdomen is soft.  Musculoskeletal:        General: No deformity. Normal range of motion.     Cervical back: Normal range of motion and neck supple.  Skin:    General: Skin is warm and dry.     Findings: No erythema or rash.     Comments: Nail hyperpigmentation changes  Neurological:     Mental Status: She is alert and oriented to person, place, and time. Mental status is at baseline.     Cranial Nerves: No cranial nerve deficit.     Coordination: Coordination normal.  Psychiatric:        Mood and Affect: Mood normal.    LABORATORY DATA:  I have reviewed the data as listed Lab Results  Component Value Date   WBC 5.6 08/09/2020   HGB 11.3 (L) 08/09/2020   HCT 33.8 (L) 08/09/2020   MCV 85.8  08/09/2020   PLT 204 08/09/2020   Recent Labs    12/16/19 0841 12/23/19 0828 01/02/20 0848 03/22/20 0951 05/03/20 1507  NA 136 138 136 139 139  K 3.9 3.8 3.6 4.0 4.7  CL 103 106 106 105 106  CO2 _0 GLUCOSE 154* 162* 190* 79 126*  BUN _1 CREATININE 0.82 0.70 0.82 0.76 1.16*  CALCIUM 9.2 9.1 8.7* 9.4 9.7  GFRNONAA >60 >60 >60 >60 53*  GFRAA >60 >60 >60  --   --   PROT 6.8 6.9 6.6 7.2  --   ALBUMIN 3.7 3.6 3.6 3.8  --   AST _2 --   ALT _3 --   ALKPHOS 33* 31* 39 28*  --   BILITOT 0.4 0.5 0.4 0.5  --    Iron/TIBC/Ferritin/ %Sat No results found for: IRON, TIBC, FERRITIN, IRONPCTSAT    RADIOGRAPHIC STUDIES: I have personally reviewed the radiological images as listed and agreed with the findings in the report. No results found.    ASSESSMENT & PLAN:  1. Chemotherapy-induced neuropathy ([place])   2. Port-A-Cath in place   3. Malignant neoplasm of upper-outer quadrant of right breast in female, estrogen receptor negative ([place])    Triple negative cT2N0 grade 3 invasive mammary carcinoma.  ER negative, PR weakly positive (<=10%), HER-2 negative Status post neoadjuvant dose dense AC followed by 12 weekly Taxol. Status post lumpectomy/sentinel lymph node biopsy ypTis ypN0 No residual invasive carcinoma. Patient did not get immunotherapy in the neoadjuvant setting. I would not add immunotherapy in the adjuvant setting. Continue surveillance.  She is about to finish radiation. Annual bilateral diagnostic mammogram in October 2022.  #Chemotherapy induced neuropathy, grade 1-2.  Recommend patient to take gabapentin 100 mg 3 times daily.  Refills were sent to pharmacy. #Port-A-Cath  Continue port flush every 8 weeks Recommend patient to keep Mediport for at least a year, prefer 2 years.   All questions were answered. The patient knows to call the clinic with any problems questions or concerns. Return of visit 2 months  Earlie Server,  MD, PhD Hematology Oncology [facility] Pager- [phone] 08/22/2020

## 2020-08-23 ENCOUNTER — Ambulatory Visit
Admission: RE | Admit: 2020-08-23 | Discharge: 2020-08-23 | Disposition: A | Discharge status: Home / Self Care | Payer: Medicare HMO | Source: Ambulatory Visit | Attending: Radiation Oncology | Admitting: Radiation Oncology

## 2020-08-23 ENCOUNTER — Telehealth: Payer: Self-pay | Admitting: Emergency Medicine

## 2020-08-23 DIAGNOSIS — Z171 Estrogen receptor negative status [ER-]: Secondary | ICD-10-CM | POA: Diagnosis not present

## 2020-08-23 DIAGNOSIS — Z510 Encounter for antineoplastic radiation therapy: Secondary | ICD-10-CM | POA: Diagnosis not present

## 2020-08-23 DIAGNOSIS — C50411 Malignant neoplasm of upper-outer quadrant of right female breast: Secondary | ICD-10-CM | POA: Diagnosis not present

## 2020-08-23 NOTE — Telephone Encounter (Addendum)
SWOG [id number]  Outgoing call: scheduled week 52 research visit on 09/27/20 at 9:30am after her appointment with Dr. Baruch Gouty.  Patient agreed to this appointment.  She denied questions at this time.  Clabe Seal Clinical Research Coordinator I  08/23/20  2:21 PM

## 2020-09-03 ENCOUNTER — Telehealth: Payer: Self-pay | Admitting: Internal Medicine

## 2020-09-03 NOTE — Progress Notes (Signed)
  Chronic Care Management   Note  09/03/2020 Name: Dawn Alvarez MRN: [id number] DOB: [birth year]  Dawn Alvarez is a 65 y.o. year old female who is a primary care patient of Lavera Guise, MD. I reached out to Shirleen Schirmer by phone today in response to a referral sent by [name]'s PCP, Lavera Guise, MD.   [name] was given information about Chronic Care Management services today including:  1. CCM service includes personalized support from designated clinical staff supervised by her physician, including individualized plan of care and coordination with other care providers 2. 24/7 contact phone numbers for assistance for urgent and routine care needs. 3. Service will only be billed when office clinical staff spend 20 minutes or more in a month to coordinate care. 4. Only one practitioner may furnish and bill the service in a calendar month. 5. The patient may stop CCM services at any time (effective at the end of the month) by phone call to the office staff.   Patient agreed to services and verbal consent obtained.   Follow up plan:   Carley Perdue [employer] Scheduler

## 2020-09-27 ENCOUNTER — Ambulatory Visit
Admission: RE | Admit: 2020-09-27 | Discharge: 2020-09-27 | Disposition: A | Discharge status: Home / Self Care | Payer: Medicare HMO | Source: Ambulatory Visit | Attending: Radiation Oncology | Admitting: Radiation Oncology

## 2020-09-27 ENCOUNTER — Inpatient Hospital Stay: Payer: Medicare HMO | Attending: Oncology

## 2020-09-27 ENCOUNTER — Encounter: Payer: Self-pay | Admitting: Radiation Oncology

## 2020-09-27 ENCOUNTER — Other Ambulatory Visit: Payer: Self-pay

## 2020-09-27 VITALS — BP 186/98 | HR 88 | Temp 96.2°F | Resp 16 | Wt 164.8 lb

## 2020-09-27 DIAGNOSIS — C50411 Malignant neoplasm of upper-outer quadrant of right female breast: Secondary | ICD-10-CM | POA: Diagnosis not present

## 2020-09-27 DIAGNOSIS — Z9221 Personal history of antineoplastic chemotherapy: Secondary | ICD-10-CM | POA: Diagnosis not present

## 2020-09-27 DIAGNOSIS — Z923 Personal history of irradiation: Secondary | ICD-10-CM | POA: Diagnosis not present

## 2020-09-27 DIAGNOSIS — G620 Drug-induced polyneuropathy: Secondary | ICD-10-CM | POA: Diagnosis not present

## 2020-09-27 DIAGNOSIS — T451X5A Adverse effect of antineoplastic and immunosuppressive drugs, initial encounter: Secondary | ICD-10-CM | POA: Insufficient documentation

## 2020-09-27 DIAGNOSIS — Z171 Estrogen receptor negative status [ER-]: Secondary | ICD-10-CM | POA: Diagnosis not present

## 2020-09-27 NOTE — Research (Signed)
S1714 - A Prospective Observational Cohort Study to Develop a Predictive Model of Taxane-Induced Peripheral Neuropathy in Cancer Patients  09/27/20  52 Week Research Visit: Patient presented to the clinic for her visit with Dr. Baruch Gouty and research visit.  Neuropathy Assessment: Neuropathy assessment was performed by Jeral Fruit, RN, per protocol.  This Research officer, political party assisted with timing and recording.  Patient reports no falls in the past 6 months.  Timed get up and go assessment was performed by this research coordinator per protocol.  The patients medication list was reviewed.  Patient reports taking hydrocodone-acetaminophen 5-325mg  twice daily and that she is not taking any vitamins or supplements.  The solicited neuropathy events form and follow up physician assessment was completed by Dr. Tasia Catchings.  Patient reports tingling and numbness in bilateral feet and hands.  PROs:  Patient requested help filling out the required week 52 PROs.  The questions were read out loud to the patient and her responses recorded by this research coordinator.  Clabe Seal Clinical Research Coordinator I  09/27/20 1:47 PM

## 2020-09-27 NOTE — Progress Notes (Signed)
Radiation Oncology Follow up Note  Name: Dawn Alvarez   Date:   09/27/2020 MRN:  [id number] DOB: 08/09/1955    This 65 y.o. 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  She is seen today in routine follow-up is doing well specifically denies breast tenderness cough or bone pain.  She is not on any antiestrogen therapy based on the triple negative nature of her disease.  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  COMPLICATIONS OF TREATMENT: none  FOLLOW UP COMPLIANCE: keeps appointments   PHYSICAL EXAM:  BP (!) 186/98   Pulse 88   Temp (!) 96.2 F (35.7 C)   Resp 16   Wt 164 lb 12.8 oz (74.8 kg)   SpO2 100%   BMI 31.14 kg/m  Lungs are clear to A&P cardiac examination essentially unremarkable with regular rate and rhythm. No dominant mass or nodularity is noted in either breast in 2 positions examined. Incision is well-healed. No axillary or supraclavicular adenopathy is appreciated. Cosmetic result is excellent.  She still does have some hyperpigmentation of the skin which we would expect.  Well-developed well-nourished patient in NAD. HEENT reveals PERLA, EOMI, discs not visualized.  Oral cavity is clear. No oral mucosal lesions are identified. Neck is clear without evidence of cervical or supraclavicular adenopathy. Lungs are clear to A&P. Cardiac examination is essentially unremarkable with regular rate and rhythm without murmur rub or thrill. Abdomen is benign with no organomegaly or masses noted. Motor sensory and DTR levels are equal and  symmetric in the upper and lower extremities. Cranial nerves II through XII are grossly intact. Proprioception is intact. No peripheral adenopathy or edema is identified. No motor or sensory levels are noted. Crude visual fields are within normal range.  RADIOLOGY RESULTS: No current films for review  PLAN: Present time patient is doing well recovering nicely from her whole breast radiation.  I am pleased with her overall progress.  Of asked to see her back in 4 to 5 months for follow-up.  Patient knows to call with any concerns.  I would like to take this opportunity to thank you for allowing me to participate in the care of your patient.Noreene Filbert, MD

## 2020-10-01 ENCOUNTER — Encounter: Payer: Self-pay | Admitting: Hospice and Palliative Medicine

## 2020-10-01 ENCOUNTER — Ambulatory Visit (INDEPENDENT_AMBULATORY_CARE_PROVIDER_SITE_OTHER): Payer: Medicare Other | Admitting: Hospice and Palliative Medicine

## 2020-10-01 ENCOUNTER — Other Ambulatory Visit: Payer: Self-pay

## 2020-10-01 VITALS — BP 136/82 | HR 85 | Temp 97.1°F | Resp 16 | Ht 61.0 in | Wt 163.2 lb

## 2020-10-01 DIAGNOSIS — Z853 Personal history of malignant neoplasm of breast: Secondary | ICD-10-CM

## 2020-10-01 DIAGNOSIS — R5383 Other fatigue: Secondary | ICD-10-CM

## 2020-10-01 DIAGNOSIS — R300 Dysuria: Secondary | ICD-10-CM

## 2020-10-01 DIAGNOSIS — I10 Essential (primary) hypertension: Secondary | ICD-10-CM | POA: Diagnosis not present

## 2020-10-01 DIAGNOSIS — Z124 Encounter for screening for malignant neoplasm of cervix: Secondary | ICD-10-CM

## 2020-10-01 DIAGNOSIS — Z0001 Encounter for general adult medical examination with abnormal findings: Secondary | ICD-10-CM | POA: Diagnosis not present

## 2020-10-01 DIAGNOSIS — E1165 Type 2 diabetes mellitus with hyperglycemia: Secondary | ICD-10-CM

## 2020-10-01 DIAGNOSIS — I700 Atherosclerosis of aorta: Secondary | ICD-10-CM

## 2020-10-01 DIAGNOSIS — Z794 Long term (current) use of insulin: Secondary | ICD-10-CM

## 2020-10-01 DIAGNOSIS — Z113 Encounter for screening for infections with a predominantly sexual mode of transmission: Secondary | ICD-10-CM

## 2020-10-01 LAB — POCT GLYCOSYLATED HEMOGLOBIN (HGB A1C): Hemoglobin A1C: 6.6 % — AB (ref 4.0–5.6)

## 2020-10-01 MED ORDER — LISINOPRIL 30 MG PO TABS: ORAL_TABLET | ORAL | 3 refills | Status: DC

## 2020-10-01 MED ORDER — OZEMPIC (1 MG/DOSE) 2 MG/1.5ML ~~LOC~~ SOPN: 1.0000 mg | PEN_INJECTOR | SUBCUTANEOUS | 6 refills | Status: DC

## 2020-10-01 NOTE — Progress Notes (Signed)
[facility] [address] [place], [place] [address]  Internal MEDICINE  Office Visit Note  Patient Name: Dawn Alvarez  [id number]  [id number]  Date of Service: 10/04/2020  Chief Complaint  Patient presents with  . Medicare Wellness    Refill request  . Diabetes  . Hyperlipidemia  . Hypertension  . COPD     HPI Pt is here for routine health maintenance examination Followed by oncology for breast cancer--completed radiation therapy as well as chemotherapy Continues to take gabapentin for chemotherapy induced neuropathy, symptoms well controlled Good appetiete, no issues with constipation, sleeping well, denies pain Requesting refills for lisinopril and ozempic DM-home glucose levels remain well controlled  Colonoscopy up to date until 2027 Due for routine fasting labs  Current Medication: Outpatient Encounter Medications as of 10/01/2020  Medication Sig  . Alcohol Swabs (B-D SINGLE USE SWABS REGULAR) PADS Use as directed twice a day  . amLODipine (NORVASC) 10 MG tablet Take 1/2 tablet po QD  . atorvastatin (LIPITOR) 10 MG tablet Take 1 tablet (10 mg total) by mouth daily.  . Blood Glucose Monitoring Suppl (TRUE METRIX AIR GLUCOSE METER) w/Device KIT 1 Device by Does not apply route in the morning and at bedtime. Use ad directed e11.65  . brimonidine (ALPHAGAN) 0.2 % ophthalmic solution Place 1 drop into both eyes 2 (two) times daily.  . Calcium Carb-Cholecalciferol (CALCIUM 600 + D) 600-200 MG-UNIT TABS Take 1 tablet by mouth daily.  Marland Kitchen gabapentin (NEURONTIN) 100 MG capsule Take 1 capsule (100 mg total) by mouth 3 (three) times daily.  Marland Kitchen glucose blood (TRUE METRIX BLOOD GLUCOSE TEST) test strip Use as instructed twice a daily diag e11.65  . HYDROcodone-acetaminophen (NORCO/VICODIN) 5-325 MG tablet Take 1-2 tablets by mouth every 6 (six) hours as needed for moderate pain or severe pain.  Marland Kitchen insulin detemir (LEVEMIR FLEXTOUCH) 100 UNIT/ML FlexPen Inject 44 Units  into the skin daily. ALONG WITH NEEDLES  . Insulin Pen Needle (PEN NEEDLES) 31G X 8 MM MISC Use as directed with insulin E11.65  . Lancets Misc. (ACCU-CHEK MULTICLIX LANCET DEV) KIT by Does not apply route. Use as directed twice a day diag E11.65  . lidocaine-prilocaine (EMLA) cream SMARTSIG:1 Topical Every Night  . lisinopril (ZESTRIL) 30 MG tablet Take one tab po qd for HTN  . Omega-3 Fatty Acids (FISH OIL) 1000 MG CAPS Take 1,000 mg by mouth daily.   . Semaglutide, 1 MG/DOSE, (OZEMPIC, 1 MG/DOSE,) 2 MG/1.5ML SOPN Inject 1 mg into the skin once a week. FOR DM  . SIMBRINZA 1-0.2 % SUSP   . TRUEplus Lancets 30G MISC Use as directed twice daily diag e11.65  . [DISCONTINUED] lisinopril (ZESTRIL) 20 MG tablet   . [DISCONTINUED] lisinopril (ZESTRIL) 30 MG tablet Take one tab po qd for HTN  . [DISCONTINUED] Semaglutide, 1 MG/DOSE, (OZEMPIC, 1 MG/DOSE,) 2 MG/1.5ML SOPN Inject 1 mg into the skin once a week. FOR DM   Facility-Administered Encounter Medications as of 10/01/2020  Medication  . sodium chloride flush (NS) 0.9 % injection 10 mL    Surgical History: Past Surgical History:  Procedure Laterality Date  . BREAST BIOPSY Right 07/05/2019   [place] bx venus marker, grade 3 invasive mammary carcinoma  . BREAST BIOPSY Right 07/05/2019   LN bx, hydromarker,PREDOMINANTLY BLOOD AND FIBROADIPOSE TISSUE, WITH SCANT LYMPHOID TISSUE PRESENT      . BREAST LUMPECTOMY WITH RADIOACTIVE SEED AND SENTINEL LYMPH NODE BIOPSY Right 05/04/2020   Procedure: RIGHT BREAST LUMPECTOMY WITH BRACKETED RADIOACTIVE SEED  AND SENTINEL LYMPH NODE BIOPSY;  Surgeon: Jovita Kussmaul, MD;  Location: [facility];  Service: General;  Laterality: Right;  . CESAREAN SECTION    . COLONOSCOPY WITH PROPOFOL N/A 09/11/2015   Procedure: COLONOSCOPY WITH PROPOFOL;  Surgeon: Lucilla Lame, MD;  Location: [facility] ENDOSCOPY;  Service: Endoscopy;  Laterality: N/A;  . IR IMAGING GUIDED PORT INSERTION  07/20/2019    Medical History: Past  Medical History:  Diagnosis Date  . Asthma   . Cancer ([place]) 03/2020   right breast IMC  . COPD exacerbation ([place]) 04/12/2016  . Diabetes mellitus without complication ([place])   . Hyperlipemia   . Hypertension   . Personal history of chemotherapy 2021    Family History: Family History  Problem Relation Age of Onset  . Diabetes Mother   . Hypertension Mother   . Hypertension Father   . Diabetes Father   . Breast cancer Neg Hx       Review of Systems  Constitutional: Negative for chills, diaphoresis and fatigue.  HENT: Negative for ear pain, postnasal drip and sinus pressure.   Eyes: Negative for photophobia, discharge, redness, itching and visual disturbance.  Respiratory: Negative for cough, shortness of breath and wheezing.   Cardiovascular: Negative for chest pain, palpitations and leg swelling.  Gastrointestinal: Negative for abdominal pain, constipation, diarrhea, nausea and vomiting.  Genitourinary: Negative for dysuria and flank pain.  Musculoskeletal: Negative for arthralgias, back pain, gait problem and neck pain.  Skin: Negative for color change.  Allergic/Immunologic: Negative for environmental allergies and food allergies.  Neurological: Negative for dizziness and headaches.  Hematological: Does not bruise/bleed easily.  Psychiatric/Behavioral: Negative for agitation, behavioral problems (depression) and hallucinations.     Vital Signs: BP 136/82   Pulse 85   Temp (!) 97.1 F (36.2 C)   Resp 16   Ht '5\' 1"'  (1.549 m)   Wt 163 lb 3.2 oz (74 kg)   SpO2 99%   BMI 30.84 kg/m    Physical Exam Vitals reviewed. Exam conducted with a chaperone present.  Constitutional:      Appearance: Normal appearance. She is normal weight.  Cardiovascular:     Rate and Rhythm: Normal rate and regular rhythm.     Pulses: Normal pulses.     Heart sounds: Normal heart sounds.  Pulmonary:     Effort: Pulmonary effort is normal.     Breath sounds: Normal breath sounds.   Chest:  Breasts:     Right: Normal.     Left: Normal.      Comments: Hyperpigmentation to right breast--s/p radiation and lumpectomy Abdominal:     General: Abdomen is flat.     Palpations: Abdomen is soft.  Genitourinary:    Vagina: Normal.     Cervix: Normal.     Uterus: Normal.   Musculoskeletal:        General: Normal range of motion.     Cervical back: Normal range of motion.  Skin:    General: Skin is warm.  Neurological:     General: No focal deficit present.     Mental Status: She is alert and oriented to person, place, and time. Mental status is at baseline.  Psychiatric:        Mood and Affect: Mood normal.        Behavior: Behavior normal.        Thought Content: Thought content normal.        Judgment: Judgment normal.      LABS:  Recent Results (from the past 2160 hour(s))  CBC     Status: Abnormal   Collection Time: 08/09/20  8:51 AM  Result Value Ref Range   WBC 5.6 4.0 - 10.5 K/uL   RBC 3.94 3.87 - 5.11 MIL/uL   Hemoglobin 11.3 (L) 12.0 - 15.0 g/dL   HCT 33.8 (L) 36.0 - 46.0 %   MCV 85.8 80.0 - 100.0 fL   MCH 28.7 26.0 - 34.0 pg   MCHC 33.4 30.0 - 36.0 g/dL   RDW 15.2 11.5 - 15.5 %   Platelets 204 150 - 400 K/uL   nRBC 0.0 0.0 - 0.2 %    Comment: Performed at [facility], [address] [place], [place] [address]  UA/M w/rflx Culture, Routine     Status: None   Collection Time: 10/01/20 10:53 AM   Specimen: Urine   Urine  Result Value Ref Range   Specific Gravity, UA 1.016 1.005 - 1.030   pH, UA 6.0 5.0 - 7.5   Color, UA Yellow Yellow   Appearance Ur Clear Clear   Leukocytes,UA Negative Negative   Protein,UA Negative Negative/Trace   Glucose, UA Negative Negative   Ketones, UA Negative Negative   RBC, UA Negative Negative   Bilirubin, UA Negative Negative   Urobilinogen, Ur 0.2 0.2 - 1.0 mg/dL   Nitrite, UA Negative Negative   Microscopic Examination Comment     Comment: Microscopic follows if indicated.   Microscopic  Examination See below:     Comment: Microscopic was indicated and was performed.   Urinalysis Reflex Comment     Comment: This specimen will not reflex to a Urine Culture.  Microscopic Examination     Status: Abnormal   Collection Time: 10/01/20 10:53 AM   Urine  Result Value Ref Range   WBC, UA None seen 0 - 5 /hpf   RBC 0-2 0 - 2 /hpf   Epithelial Cells (non renal) >10 (A) 0 - 10 /hpf   Casts None seen None seen /lpf   Bacteria, UA Few None seen/Few  POCT HgB A1C     Status: Abnormal   Collection Time: 10/01/20 10:54 AM  Result Value Ref Range   Hemoglobin A1C 6.6 (A) 4.0 - 5.6 %   HbA1c POC (<> result, manual entry)     HbA1c, POC (prediabetic range)     HbA1c, POC (controlled diabetic range)    NuSwab Vaginitis Plus (VG+)     Status: Abnormal   Collection Time: 10/01/20  4:03 PM  Result Value Ref Range   Atopobium vaginae High - 2 (A) Score   BVAB 2 High - 2 (A) Score   Megasphaera 1 High - 2 (A) Score    Comment: Calculate total score by adding the 3 individual bacterial vaginosis (BV) marker scores together.  Total score is interpreted as follows: Total score 0-1: Indicates the absence of BV. Total score   2: Indeterminate for BV. Additional clinical                  data should be evaluated to establish a                  diagnosis. Total score 3-6: Indicates the presence of BV. This test was developed and its performance characteristics determined by [facility].  It has not been cleared or approved by the Food and Drug Administration.    Candida albicans, NAA Negative Negative   Candida glabrata, NAA Negative Negative   Trich  vag by NAA Negative Negative   Chlamydia trachomatis, NAA Negative Negative   Neisseria gonorrhoeae, NAA Negative Negative    Assessment/Plan: 1. Encounter for routine adult health examination with abnormal findings Well appearing 65 year old female Up to date on PHM  2. Type 2 diabetes mellitus with hyperglycemia, with long-term current  use of insulin (HCC) A1C 6.6--continue with current management, requesting refills of Ozempic, continue to monitor - POCT HgB A1C - Semaglutide, 1 MG/DOSE, (OZEMPIC, 1 MG/DOSE,) 2 MG/1.5ML SOPN; Inject 1 mg into the skin once a week. FOR DM  Dispense: 6 mL; Refill: 6  3. Essential hypertension BP and HR remain well controlled, requesting refills today, continue to monitor - lisinopril (ZESTRIL) 30 MG tablet; Take one tab po qd for HTN  Dispense: 90 tablet; Refill: 3  4. Atherosclerosis of aorta ([place]) Continue with statin therapy, tolerating well, consider further vascular studies  5. History of right breast cancer Followed and managed by oncology, s/p lumpectomy, chemotherapy as well as radiation treatments  6. Encounter for screening for cervical cancer - IGP, Aptima HPV  7. Screen for STD (sexually transmitted disease) - NuSwab Vaginitis Plus (VG+)  8. Dysuria - UA/M w/rflx Culture, Routine - Microscopic Examination  9. Other fatigue - CBC w/Diff/Platelet - Comprehensive Metabolic Panel (CMET) - Lipid Panel With LDL/HDL Ratio - TSH + free T4  General Counseling: Dawn Alvarez verbalizes understanding of the findings of todays visit and agrees with plan of treatment. I have discussed any further diagnostic evaluation that may be needed or ordered today. We also reviewed her medications today. she has been encouraged to call the office with any questions or concerns that should arise related to todays visit.    Counseling:    Orders Placed This Encounter  Procedures  . Microscopic Examination  . UA/M w/rflx Culture, Routine  . CBC w/Diff/Platelet  . Comprehensive Metabolic Panel (CMET)  . Lipid Panel With LDL/HDL Ratio  . TSH + free T4  . NuSwab Vaginitis Plus (VG+)  . POCT HgB A1C    Meds ordered this encounter  Medications  . lisinopril (ZESTRIL) 30 MG tablet    Sig: Take one tab po qd for HTN    Dispense:  90 tablet    Refill:  3  . Semaglutide, 1 MG/DOSE,  (OZEMPIC, 1 MG/DOSE,) 2 MG/1.5ML SOPN    Sig: Inject 1 mg into the skin once a week. FOR DM    Dispense:  6 mL    Refill:  6    Total time spent: 30 Minutes  Time spent includes review of chart, medications, test results, and follow up plan with the patient.   This patient was seen by Theodoro Grist AGNP-C Collaboration with Dr Lavera Guise as a part of collaborative care agreement   Tanna Furry. [facility] Internal Medicine

## 2020-10-02 LAB — MICROSCOPIC EXAMINATION
Casts: NONE SEEN /lpf
Epithelial Cells (non renal): >10 /hpf — AB (ref 0–10)
WBC, UA: NONE SEEN /hpf (ref 0–5)

## 2020-10-02 LAB — UA/M W/RFLX CULTURE, ROUTINE
Bilirubin, UA: NEGATIVE
Glucose, UA: NEGATIVE
Ketones, UA: NEGATIVE
Leukocytes,UA: NEGATIVE
Nitrite, UA: NEGATIVE
Protein,UA: NEGATIVE
RBC, UA: NEGATIVE
Specific Gravity, UA: 1.016 (ref 1.005–1.030)
Urobilinogen, Ur: 0.2 mg/dL (ref 0.2–1.0)
pH, UA: 6 (ref 5.0–7.5)

## 2020-10-03 ENCOUNTER — Other Ambulatory Visit: Payer: Self-pay | Admitting: Hospice and Palliative Medicine

## 2020-10-03 LAB — NUSWAB VAGINITIS PLUS (VG+)
Atopobium vaginae: HIGH Score — AB
BVAB 2: HIGH Score — AB
Candida albicans, NAA: NEGATIVE
Candida glabrata, NAA: NEGATIVE
Chlamydia trachomatis, NAA: NEGATIVE
Megasphaera 1: HIGH Score — AB
Neisseria gonorrhoeae, NAA: NEGATIVE
Trich vag by NAA: NEGATIVE

## 2020-10-03 MED ORDER — METRONIDAZOLE 500 MG PO TABS: 500.0000 mg | ORAL_TABLET | Freq: Two times a day (BID) | ORAL | 0 refills | Status: AC

## 2020-10-03 NOTE — Progress Notes (Signed)
Please call and let her know based on vaginal swab she has tested positive for bacterial vaginosis. I have sent Metronidazole to her pharmacy for her to take for treatment. Please educate her that while taking Metronidazole she should not consume alcohol. Thanks!

## 2020-10-04 ENCOUNTER — Encounter: Payer: Self-pay | Admitting: Hospice and Palliative Medicine

## 2020-10-05 LAB — CBC WITH DIFFERENTIAL/PLATELET
Basophils Absolute: 0.1 10*3/uL (ref 0.0–0.2)
Basos: 1 %
EOS (ABSOLUTE): 0.2 10*3/uL (ref 0.0–0.4)
Eos: 3 %
Hematocrit: 35.9 % (ref 34.0–46.6)
Hemoglobin: 11.8 g/dL (ref 11.1–15.9)
Immature Grans (Abs): 0 10*3/uL (ref 0.0–0.1)
Immature Granulocytes: 0 %
Lymphocytes Absolute: 1.5 10*3/uL (ref 0.7–3.1)
Lymphs: 24 %
MCH: 28.1 pg (ref 26.6–33.0)
MCHC: 32.9 g/dL (ref 31.5–35.7)
MCV: 86 fL (ref 79–97)
Monocytes Absolute: 0.6 10*3/uL (ref 0.1–0.9)
Monocytes: 9 %
Neutrophils Absolute: 3.9 10*3/uL (ref 1.4–7.0)
Neutrophils: 63 %
Platelets: 224 10*3/uL (ref 150–450)
RBC: 4.2 x10E6/uL (ref 3.77–5.28)
RDW: 15.9 % — ABNORMAL HIGH (ref 11.7–15.4)
WBC: 6.2 10*3/uL (ref 3.4–10.8)

## 2020-10-05 LAB — IGP, APTIMA HPV: HPV Aptima: NEGATIVE

## 2020-10-05 LAB — COMPREHENSIVE METABOLIC PANEL
ALT: 14 IU/L (ref 0–32)
AST: 17 IU/L (ref 0–40)
Albumin/Globulin Ratio: 1.8 (ref 1.2–2.2)
Albumin: 4.4 g/dL (ref 3.8–4.8)
Alkaline Phosphatase: 40 IU/L — ABNORMAL LOW (ref 44–121)
BUN/Creatinine Ratio: 10 — ABNORMAL LOW (ref 12–28)
BUN: 8 mg/dL (ref 8–27)
Bilirubin Total: 0.3 mg/dL (ref 0.0–1.2)
CO2: 23 mmol/L (ref 20–29)
Calcium: 9.3 mg/dL (ref 8.7–10.3)
Chloride: 101 mmol/L (ref 96–106)
Creatinine, Ser: 0.82 mg/dL (ref 0.57–1.00)
Globulin, Total: 2.5 g/dL (ref 1.5–4.5)
Glucose: 121 mg/dL — ABNORMAL HIGH (ref 65–99)
Potassium: 4 mmol/L (ref 3.5–5.2)
Sodium: 139 mmol/L (ref 134–144)
Total Protein: 6.9 g/dL (ref 6.0–8.5)
eGFR: 80 mL/min/{1.73_m2} (ref >59)

## 2020-10-05 LAB — LIPID PANEL WITH LDL/HDL RATIO
Cholesterol, Total: 239 mg/dL — ABNORMAL HIGH (ref 100–199)
HDL: 58 mg/dL (ref >39)
LDL Chol Calc (NIH): 143 mg/dL — ABNORMAL HIGH (ref 0–99)
LDL/HDL Ratio: 2.5 ratio (ref 0.0–3.2)
Triglycerides: 210 mg/dL — ABNORMAL HIGH (ref 0–149)
VLDL Cholesterol Cal: 38 mg/dL (ref 5–40)

## 2020-10-05 LAB — TSH+FREE T4
Free T4: 1.35 ng/dL (ref 0.82–1.77)
TSH: 2.46 u[IU]/mL (ref 0.450–4.500)

## 2020-10-17 ENCOUNTER — Telehealth: Payer: Self-pay | Admitting: Pharmacist

## 2020-10-17 NOTE — Progress Notes (Signed)
Chronic Care Management Pharmacy Note  10/24/2020 Name:  Dawn Alvarez MRN:  [id number] DOB:  May 01, 1956  Subjective: Dawn Alvarez is an 65 y.o. year old female who is a primary patient of Humphrey Rolls Timoteo Gaul, MD.  The CCM team was consulted for assistance with disease management and care coordination needs.    Engaged with patient by telephone for initial visit in response to provider referral for pharmacy case management and/or care coordination services.   Consent to Services:  The patient was given the following information about Chronic Care Management services today, agreed to services, and gave verbal consent: 1. CCM service includes personalized support from designated clinical staff supervised by the primary care provider, including individualized plan of care and coordination with other care providers 2. 24/7 contact phone numbers for assistance for urgent and routine care needs. 3. Service will only be billed when office clinical staff spend 20 minutes or more in a month to coordinate care. 4. Only one practitioner may furnish and bill the service in a calendar month. 5.The patient may stop CCM services at any time (effective at the end of the month) by phone call to the office staff. 6. The patient will be responsible for cost sharing (co-pay) of up to 20% of the service fee (after annual deductible is met). Patient agreed to services and consent obtained.  Patient Care Team: Lavera Guise, MD as PCP - General (Internal Medicine) Kate Sable, MD as PCP - Cardiology (Cardiology) Rico Junker, RN as Registered Nurse Theodore Demark, RN as Registered Nurse Earlie Server, MD as Consulting Physician (Oncology) Rico Junker, RN as Registered Nurse Edythe Clarity, [facility] as Pharmacist (Pharmacist)  Recent office visits: None since last 6 months   Recent consult visits: 08/22/20 Oncology Earlie Server, MD. For follow-Up. CHANGED/INCREASED Gabapentin to 100 mg 3 times daily.   07/11/20 Ophthalmoglogy Dingeldein, Remo Lipps No information given.  05/18/20 Oncology Earlie Server, MD. For follow-Up. No medication changes.  Hospital visits: 05/04/20 Jovita Kussmaul, [place] (5 Hours) For right breast lumpectomy with bracketed radioactive seed and sentinel lymph node biopsy   Medication History: Atorvastatin 20 mg 90 DS 07/30/20 Lisinopril 30 mg 90 DS 09/27/20 Gabapentin 100 mg 90 DS 08/22/20  Objective:  Lab Results  Component Value Date   CREATININE 0.84 10/22/2020   BUN 20 10/22/2020   GFRNONAA >60 10/22/2020   GFRAA >60 01/02/2020   NA 137 10/22/2020   K 3.8 10/22/2020   CALCIUM 9.1 10/22/2020   CO2 23 10/22/2020   GLUCOSE 151 (H) 10/22/2020    Lab Results  Component Value Date/Time   HGBA1C 6.6 (A) 10/01/2020 10:54 AM   HGBA1C 6.5 (A) 04/03/2020 10:51 AM   HGBA1C 10.6 (H) 09/03/2019 06:25 PM   HGBA1C 11.2 (H) 07/27/2011 09:36 AM    Last diabetic Eye exam: No results found for: HMDIABEYEEXA  Last diabetic Foot exam: No results found for: HMDIABFOOTEX   Lab Results  Component Value Date   CHOL 239 (H) 10/04/2020   HDL 58 10/04/2020   LDLCALC 143 (H) 10/04/2020   TRIG 210 (H) 10/04/2020   CHOLHDL 5.8 (H) 10/05/2017    Hepatic Function Latest Ref Rng & Units 10/22/2020 10/04/2020 03/22/2020  Total Protein 6.5 - 8.1 g/dL 7.3 6.9 7.2  Albumin 3.5 - 5.0 g/dL 3.8 4.4 3.8  AST 15 - 41 U/L '25 17 18  ' ALT 0 - 44 U/L '16 14 15  ' Alk Phosphatase 38 - 126  U/L 35(L) 40(L) 28(L)  Total Bilirubin 0.3 - 1.2 mg/dL 0.6 0.3 0.5  Bilirubin, Direct 0.1 - 0.5 mg/dL - - -    Lab Results  Component Value Date/Time   TSH 2.460 10/04/2020 08:51 AM   TSH 0.908 09/03/2019 06:25 PM   TSH 1.680 10/05/2017 09:38 AM   FREET4 1.35 10/04/2020 08:51 AM   FREET4 1.36 10/06/2018 11:21 AM    CBC Latest Ref Rng & Units 10/22/2020 10/04/2020 08/09/2020  WBC 4.0 - 10.5 K/uL 5.6 6.2 5.6  Hemoglobin 12.0 - 15.0 g/dL 11.6(L) 11.8 11.3(L)  Hematocrit 36.0 - 46.0 %  34.2(L) 35.9 33.8(L)  Platelets 150 - 400 K/uL 252 224 204    Lab Results  Component Value Date/Time   VD25OH 18.1 (L) 10/06/2018 11:21 AM    Clinical ASCVD: No  The ASCVD Risk score Mikey Bussing DC Jr., et al., 2013) failed to calculate for the following reasons:   The patient has a prior MI or stroke diagnosis    Depression screen [facility] 2/9 10/01/2020 04/03/2020 12/29/2019  Decreased Interest 0 0 0  Down, Depressed, Hopeless 0 0 0  PHQ - 2 Score 0 0 0  Some recent data might be hidden     Social History   Tobacco Use  Smoking Status Former Smoker  . Packs/day: 1.00  . Years: 2.00  . Pack years: 2.00  . Types: Cigarettes  . Quit date: 02/29/2020  . Years since quitting: 0.6  Smokeless Tobacco Never Used   BP Readings from Last 3 Encounters:  10/22/20 121/79  10/01/20 136/82  09/27/20 (!) 186/98   Pulse Readings from Last 3 Encounters:  10/22/20 80  10/01/20 85  09/27/20 88   Wt Readings from Last 3 Encounters:  10/22/20 164 lb 6.4 oz (74.6 kg)  10/01/20 163 lb 3.2 oz (74 kg)  09/27/20 164 lb 12.8 oz (74.8 kg)   BMI Readings from Last 3 Encounters:  10/22/20 31.06 kg/m  10/01/20 30.84 kg/m  09/27/20 31.14 kg/m    Assessment/Interventions: Review of patient past medical history, allergies, medications, health status, including review of consultants reports, laboratory and other test data, was performed as part of comprehensive evaluation and provision of chronic care management services.   SDOH:  (Social Determinants of Health) assessments and interventions performed: Yes   Financial Resource Strain: Low Risk   . Difficulty of Paying Living Expenses: Not very hard    SDOH Screenings   Alcohol Screen: Low Risk   . Last Alcohol Screening Score (AUDIT): 4  Depression (PHQ2-9): Low Risk   . PHQ-2 Score: 0  Financial Resource Strain: Low Risk   . Difficulty of Paying Living Expenses: Not very hard  Food Insecurity: Not on file  Housing: Not on file  Physical  Activity: Not on file  Social Connections: Not on file  Stress: Not on file  Tobacco Use: Medium Risk  . Smoking Tobacco Use: Former Smoker  . Smokeless Tobacco Use: Never Used  Transportation Needs: Not on file    [place]  No Known Allergies  Medications Reviewed Today    Reviewed by Edythe Clarity, [facility] (Pharmacist) on 10/24/20 at [phone]  Med List Status: <None>  Medication Order Taking? Sig Documenting Provider Last Dose Status Informant  Alcohol Swabs (B-D SINGLE USE SWABS REGULAR) PADS [id number] Yes Use as directed twice a day Lavera Guise, MD Taking Active   amLODipine (NORVASC) 10 MG tablet [id number] Yes Take 1/2 tablet po QD [name], NP Taking Active  atorvastatin (LIPITOR) 10 MG tablet [id number] No Take 1 tablet (10 mg total) by mouth daily.  Patient not taking: No sig reported   Lavera Guise, MD Not Taking Active   Blood Glucose Monitoring Suppl (TRUE METRIX AIR GLUCOSE METER) w/Device KIT [id number] Yes 1 Device by Does not apply route in the morning and at bedtime. Use ad directed e11.65 Lavera Guise, MD Taking Active   brimonidine [facility]) 0.2 % ophthalmic solution [id number] Yes Place 1 drop into both eyes 2 (two) times daily. [provider] Taking Active Self           Med Note Vanice Sarah   Tue Oct 18, 2019  8:40 AM)    Calcium Carb-Cholecalciferol (CALCIUM 600 + D) 600-200 MG-UNIT TABS [id number] Yes Take 1 tablet by mouth daily. [provider] Taking Active   gabapentin (NEURONTIN) 100 MG capsule [id number] Yes Take 1 capsule (100 mg total) by mouth 3 (three) times daily. Earlie Server, MD Taking Active   glucose blood (TRUE METRIX BLOOD GLUCOSE TEST) test strip [id number] Yes Use as instructed twice a daily diag e11.65 Kendell Bane, NP Taking Active   HYDROcodone-acetaminophen (NORCO/VICODIN) 5-325 MG tablet [id number] Yes Take 1-2 tablets by mouth every 6 (six) hours as needed for moderate pain or severe pain. Autumn Messing III,  MD Taking Active   insulin detemir (LEVEMIR FLEXTOUCH) 100 UNIT/ML FlexPen [id number] Yes Inject 44 Units into the skin daily. ALONG WITH NEEDLES Lavera Guise, MD Taking Active            Med Note (Sarah Ann   Wed Oct 24, 2020  9:48 AM) Patient taking 30 units   Insulin Pen Needle (PEN NEEDLES) 31G X 8 MM MISC [id number] Yes Use as directed with insulin E11.65 [name], NP Taking Active   Lancets Misc. (ACCU-CHEK MULTICLIX LANCET DEV) KIT [id number] Yes by Does not apply route. Use as directed twice a day diag E11.65 [provider] Taking Active Self  lidocaine-prilocaine (EMLA) cream [id number] Yes SMARTSIG:1 Topical Every Night [provider] Taking Active   lisinopril (ZESTRIL) 30 MG tablet [id number] Yes Take one tab po qd for HTN Luiz Ochoa, NP Taking Active   Omega-3 Fatty Acids (FISH OIL) 1000 MG CAPS [id number] Yes Take 1,000 mg by mouth daily.  [provider] Taking Active Self  Semaglutide, 1 MG/DOSE, (OZEMPIC, 1 MG/DOSE,) 2 MG/1.5ML SOPN [id number] Yes Inject 1 mg into the skin once a week. FOR DM Luiz Ochoa, NP Taking Active   SIMBRINZA 1-0.2 % SUSP [id number] Yes  [provider] Taking Active Self  sodium chloride flush (NS) 0.9 % injection 10 mL [id number]   Earlie Server, MD  Active   TRUEplus Lancets 30G MISC [id number] Yes Use as directed twice daily diag e11.65 [name], NP Taking Active           Patient Active Problem List   Diagnosis Date Noted  . Neuropathy 03/22/2020  . Port-A-Cath in place 03/22/2020  . Antineoplastic chemotherapy induced anemia 11/17/2019  . Encounter for antineoplastic chemotherapy 11/17/2019  . Genetic testing 11/14/2019  . Sinus bradycardia 09/03/2019  . Hypokalemia 09/03/2019  . Dehydration 08/04/2019  . Goals of care, counseling/discussion 07/27/2019  . Malignant neoplasm of upper-outer quadrant of right breast in female, estrogen receptor negative ([place]) 07/27/2019  .  Malignant neoplasm of upper-outer quadrant of right female breast ([place]) 07/08/2019  . Mass of soft tissue of chest 06/18/2019  . Mass of  axilla, left 06/18/2019  . Encounter for screening mammogram for malignant neoplasm of breast 06/18/2019  . Visual changes 06/18/2019  . 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 Acute upper respiratory infection 04/15/2018  . Mixed hyperlipidemia 01/10/2018  . Dysuria 10/11/2017  . Headache, acute 06/24/2017  . Type 2 diabetes mellitus with hyperglycemia, with long-term current use of insulin ([place]) 06/23/2017  . Essential hypertension 06/23/2017  . Syncope 04/12/2016  . Dizziness and giddiness 04/12/2016  . Acute bronchitis 04/12/2016  . COPD exacerbation ([employer]) 04/12/2016  . Tobacco abuse counseling 04/12/2016  . Atypical chest pain 04/11/2016  . Encounter for general adult medical examination with abnormal findings   . Benign neoplasm of sigmoid colon     Immunization History  Administered Date(s) Administered  . Influenza Inj Mdck Quad Pf 06/23/2017, 04/03/2020  . Influenza, Quadrivalent, Recombinant, Inj, Pf 03/10/2019  . Influenza,inj,Quad PF,6+ Mos 04/12/2016  . Pneumococcal Polysaccharide-23 04/12/2016, 03/10/2019    Conditions to be addressed/monitored:  HTN, Type II DM w/ Neuropathy, HLD,   Care Plan : General Pharmacy (Adult)  Updates made by Edythe Clarity, RPH since 10/24/2020 12:00 AM    Problem: HTN, HLD, DM   Priority: High  Onset Date: 10/24/2020    Long-Range Goal: Patient-Specific Goal   Start Date: 10/24/2020  Expected End Date: 04/26/2021  This Visit's Progress: On track  Priority: High  Note:   Current Barriers:  . Unable to achieve control of cholesterol   Pharmacist Clinical Goal(s):  Marland Kitchen Patient will achieve control of cholesterol as evidenced by follow up labs through collaboration with PharmD and provider.   Interventions: . 1:1 collaboration with Lavera Guise, MD regarding development and update of  comprehensive plan of care as evidenced by provider attestation and co-signature . Inter-disciplinary care team collaboration (see longitudinal plan of care) . Comprehensive medication review performed; medication list updated in electronic medical record  Hypertension (BP goal <130/80) -Controlled -Current treatment: . Amlodipine 8m daily - one half tablet daily . Lisinopril 349mdaily -Medications previously tried: hydralazine, bisoprolol/HCTZ  -Current home readings: none noted, not checking -Current dietary habits: mentions mainly drinking water -Current exercise habits: "walks all the time" -Denies hypotensive/hypertensive symptoms -Educated on BP goals and benefits of medications for prevention of heart attack, stroke and kidney damage; Daily salt intake goal < 2300 mg; Exercise goal of 150 minutes per week; Importance of home blood pressure monitoring; -Counseled to monitor BP at home at least a few times per week, document, and provide log at future appointments -Recommended to continue current medication Recommended home monitoring, call provider if BP consistently > 130/90  Hyperlipidemia: (LDL goal < 70) -Uncontrolled -Current treatment: . None - patient was not taking her atorvastatin -Medications previously tried: pravastatin, gemfibrozil   -Current exercise habits: walks alot -Educated on Cholesterol goals;  Benefits of statin for ASCVD risk reduction; Importance of limiting foods high in cholesterol; Exercise goal of 150 minutes per week;  -Reviewed most recent LDL - elevated to 143 -Discussed risk of stroke/heart attack and need for medication to help reduce this risk -Recommended Atorvastatin 2026maily, reinforced importance of adherence with this medication and to work on eliminating fatty and fried foods from diet.  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 Levemir 30 units once daily . Ozempic 1mg86mce weekly . Gabapentin  100mg73m - recent increase -Medications previously tried: glyburide  -Current home glucose readings . fasting glucose: not checking . post prandial glucose: not checking -Denies hypoglycemic/hyperglycemic symptoms -  Current exercise: walks a lot -Educated on A1c and blood sugar goals; Complications of diabetes including kidney damage, retinal damage, and cardiovascular disease; Prevention and management of hypoglycemic episodes; Benefits of routine self-monitoring of blood sugar; -Counseled to check feet daily and get yearly eye exams -Does reports some neuropathy in her feet that comes and goes -Recommended to continue current medication Recommended patient check her fasting blood sugar at least a few times per week, she does not have a meter at [facility] so I will request we call in Rx for meter and testing supplies today.     Patient Goals/Self-Care Activities . Patient will:  - take medications as prescribed check glucose a few times per week, document, and provide at future appointments check blood pressure a few times per week, document, and provide at future appointments target a minimum of 150 minutes of moderate intensity exercise weekly  Follow Up Plan: The care management team will reach out to the patient again over the next 120 days.         Medication Assistance: None required.  Patient affirms current coverage meets needs.  Patient's preferred pharmacy is:  [facility] [address] ([address]), [place] - [place] ([place]) [place] [address] Phone: [phone] Fax: [facility] [place], [place] [address] Phone: [phone] Fax: [phone]  Uses pill box? Yes Pt endorses 100% compliance  We discussed: Benefits of medication synchronization, packaging and delivery as well as enhanced pharmacist oversight with [employer]. Patient decided  to: Continue current medication management strategy  Care Plan and Follow Up Patient Decision:  Patient agrees to Care Plan and Follow-up.  Plan: The care management team will reach out to the patient again over the next 120 days.  Beverly Milch, PharmD Clinical Pharmacist [facility] [phone]

## 2020-10-17 NOTE — Progress Notes (Addendum)
Chronic Care Management Pharmacy Assistant   Name: Dawn Alvarez  MRN: [id number] DOB: 02/25/1956  Dawn Alvarez is an 65 y.o. year old female who presents for his initial CCM visit with the clinical pharmacist.  Reason for Encounter: Chart Prep   Conditions to be addressed/monitored: HTN, COPD, DM II, Tobacco Abuse, HLD.  Primary concerns for visit include: HTN, DM II and Numbness in her feet.   Recent office visits:  None since last 6 months   Recent consult visits:  08/22/20 Oncology Earlie Server, MD. For follow-Up. CHANGED/INCREASED Gabapentin to 100 mg 3 times daily.  07/11/20 Ophthalmoglogy Dingeldein, Remo Lipps No information given.  05/18/20 Oncology Earlie Server, MD. For follow-Up. No medication changes.  Hospital visits:  05/04/20 Jovita Kussmaul, [place] (5 Hours) For right breast lumpectomy with bracketed radioactive seed and sentinel lymph node biopsy.  Medication History: Atorvastatin 20 mg 90 DS 07/30/20 Lisinopril 30 mg 90 DS 09/27/20 Gabapentin 100 mg 90 DS 08/22/20  Medications: Outpatient Encounter Medications as of 10/17/2020  Medication Sig   Alcohol Swabs (B-D SINGLE USE SWABS REGULAR) PADS Use as directed twice a day   amLODipine (NORVASC) 10 MG tablet Take 1/2 tablet po QD   atorvastatin (LIPITOR) 10 MG tablet Take 1 tablet (10 mg total) by mouth daily.   Blood Glucose Monitoring Suppl (TRUE METRIX AIR GLUCOSE METER) w/Device KIT 1 Device by Does not apply route in the morning and at bedtime. Use ad directed e11.65   brimonidine (ALPHAGAN) 0.2 % ophthalmic solution Place 1 drop into both eyes 2 (two) times daily.   Calcium Carb-Cholecalciferol (CALCIUM 600 + D) 600-200 MG-UNIT TABS Take 1 tablet by mouth daily.   gabapentin (NEURONTIN) 100 MG capsule Take 1 capsule (100 mg total) by mouth 3 (three) times daily.   glucose blood (TRUE METRIX BLOOD GLUCOSE TEST) test strip Use as instructed twice a daily diag e11.65    HYDROcodone-acetaminophen (NORCO/VICODIN) 5-325 MG tablet Take 1-2 tablets by mouth every 6 (six) hours as needed for moderate pain or severe pain.   insulin detemir (LEVEMIR FLEXTOUCH) 100 UNIT/ML FlexPen Inject 44 Units into the skin daily. ALONG WITH NEEDLES   Insulin Pen Needle (PEN NEEDLES) 31G X 8 MM MISC Use as directed with insulin E11.65   Lancets Misc. (ACCU-CHEK MULTICLIX LANCET DEV) KIT by Does not apply route. Use as directed twice a day diag E11.65   lidocaine-prilocaine (EMLA) cream SMARTSIG:1 Topical Every Night   lisinopril (ZESTRIL) 30 MG tablet Take one tab po qd for HTN   Omega-3 Fatty Acids (FISH OIL) 1000 MG CAPS Take 1,000 mg by mouth daily.    Semaglutide, 1 MG/DOSE, (OZEMPIC, 1 MG/DOSE,) 2 MG/1.5ML SOPN Inject 1 mg into the skin once a week. FOR DM   SIMBRINZA 1-0.2 % SUSP    TRUEplus Lancets 30G MISC Use as directed twice daily diag e11.65   Facility-Administered Encounter Medications as of 10/17/2020  Medication   sodium chloride flush (NS) 0.9 % injection 10 mL    Have you seen any other providers since your last visit? Patient stated no.  Any changes in your medications or health? Patient stated no.  Any side effects from any medications? Patient stated no.   Do you have an symptoms or problems not managed by your medications? Patient stated no.  Any concerns about your health right now?  Patient stated her feet are numb all the time.  Has your provider asked that you check blood pressure,  blood sugar, or follow special diet at home? Patient stated she monitors her blood pressure sometimes and blood sugar.   Do you get any type of exercise on a regular basis? Patient stated she walks all the time.   Can you think of a goal you would like to reach for your health? Patient stated to improve the numbness in her feet.   Do you have any problems getting your medications?Patient stated no.  Is there anything that you would like to discuss during the  appointment? Patient stated no.  Please bring medications and supplements to appointment, patient reminded of 5/25 face to face appointment at 9 am.  [place], [place] Pharmacist Assistant [phone]

## 2020-10-22 ENCOUNTER — Inpatient Hospital Stay: Payer: Medicare Other | Attending: Oncology

## 2020-10-22 ENCOUNTER — Inpatient Hospital Stay (HOSPITAL_BASED_OUTPATIENT_CLINIC_OR_DEPARTMENT_OTHER): Payer: Medicare Other | Admitting: Oncology

## 2020-10-22 ENCOUNTER — Encounter: Payer: Self-pay | Admitting: Oncology

## 2020-10-22 VITALS — BP 121/79 | HR 80 | Temp 96.8°F | Resp 16 | Wt 164.4 lb

## 2020-10-22 DIAGNOSIS — T451X5A Adverse effect of antineoplastic and immunosuppressive drugs, initial encounter: Secondary | ICD-10-CM | POA: Diagnosis not present

## 2020-10-22 DIAGNOSIS — G620 Drug-induced polyneuropathy: Secondary | ICD-10-CM | POA: Diagnosis not present

## 2020-10-22 DIAGNOSIS — Z95828 Presence of other vascular implants and grafts: Secondary | ICD-10-CM | POA: Diagnosis not present

## 2020-10-22 DIAGNOSIS — C50411 Malignant neoplasm of upper-outer quadrant of right female breast: Secondary | ICD-10-CM | POA: Diagnosis present

## 2020-10-22 DIAGNOSIS — N63 Unspecified lump in unspecified breast: Secondary | ICD-10-CM | POA: Diagnosis not present

## 2020-10-22 DIAGNOSIS — Z171 Estrogen receptor negative status [ER-]: Secondary | ICD-10-CM | POA: Insufficient documentation

## 2020-10-22 DIAGNOSIS — Z79899 Other long term (current) drug therapy: Secondary | ICD-10-CM | POA: Insufficient documentation

## 2020-10-22 LAB — COMPREHENSIVE METABOLIC PANEL
ALT: 16 U/L (ref 0–44)
AST: 25 U/L (ref 15–41)
Albumin: 3.8 g/dL (ref 3.5–5.0)
Alkaline Phosphatase: 35 U/L — ABNORMAL LOW (ref 38–126)
Anion gap: 11 (ref 5–15)
BUN: 20 mg/dL (ref 8–23)
CO2: 23 mmol/L (ref 22–32)
Calcium: 9.1 mg/dL (ref 8.9–10.3)
Chloride: 103 mmol/L (ref 98–111)
Creatinine, Ser: 0.84 mg/dL (ref 0.44–1.00)
GFR, Estimated: >60 mL/min (ref >60)
Glucose, Bld: 151 mg/dL — ABNORMAL HIGH (ref 70–99)
Potassium: 3.8 mmol/L (ref 3.5–5.1)
Sodium: 137 mmol/L (ref 135–145)
Total Bilirubin: 0.6 mg/dL (ref 0.3–1.2)
Total Protein: 7.3 g/dL (ref 6.5–8.1)

## 2020-10-22 LAB — CBC WITH DIFFERENTIAL/PLATELET
Abs Immature Granulocytes: 0.02 10*3/uL (ref 0.00–0.07)
Basophils Absolute: 0.1 10*3/uL (ref 0.0–0.1)
Basophils Relative: 1 %
Eosinophils Absolute: 0.2 10*3/uL (ref 0.0–0.5)
Eosinophils Relative: 3 %
HCT: 34.2 % — ABNORMAL LOW (ref 36.0–46.0)
Hemoglobin: 11.6 g/dL — ABNORMAL LOW (ref 12.0–15.0)
Immature Granulocytes: 0 %
Lymphocytes Relative: 20 %
Lymphs Abs: 1.1 10*3/uL (ref 0.7–4.0)
MCH: 28.9 pg (ref 26.0–34.0)
MCHC: 33.9 g/dL (ref 30.0–36.0)
MCV: 85.3 fL (ref 80.0–100.0)
Monocytes Absolute: 0.6 10*3/uL (ref 0.1–1.0)
Monocytes Relative: 10 %
Neutro Abs: 3.7 10*3/uL (ref 1.7–7.7)
Neutrophils Relative %: 66 %
Platelets: 252 10*3/uL (ref 150–400)
RBC: 4.01 MIL/uL (ref 3.87–5.11)
RDW: 15.5 % (ref 11.5–15.5)
WBC: 5.6 10*3/uL (ref 4.0–10.5)
nRBC: 0 % (ref 0.0–0.2)

## 2020-10-22 MED ORDER — SODIUM CHLORIDE 0.9% FLUSH
10.0000 mL | INTRAVENOUS | Status: DC | PRN
  Administered 2020-10-22: 10 mL via INTRAVENOUS
  Filled 2020-10-22: qty 10

## 2020-10-22 MED ORDER — HEPARIN SOD (PORK) LOCK FLUSH 100 UNIT/ML IV SOLN
500.0000 [IU] | Freq: Once | INTRAVENOUS | Status: AC
  Administered 2020-10-22: 500 [IU] via INTRAVENOUS
  Filled 2020-10-22: qty 5

## 2020-10-22 MED ORDER — HEPARIN SOD (PORK) LOCK FLUSH 100 UNIT/ML IV SOLN
INTRAVENOUS | Status: AC
  Filled 2020-10-22: qty 5

## 2020-10-22 NOTE — Progress Notes (Signed)
Patient's neuropathy has improved in the hands but still has in feet on occasion.

## 2020-10-22 NOTE — Progress Notes (Signed)
Hematology/Oncology follow up note Dawn Alvarez [phone] [phone]   Patient Care Team: Lavera Guise, MD as PCP - General (Internal Medicine) Kate Sable, MD as PCP - Cardiology (Cardiology) Rico Junker, RN as Registered Nurse Theodore Demark, RN as Registered Nurse Earlie Server, MD as Consulting Physician (Oncology) Rico Junker, RN as Registered Nurse Edythe Clarity, [facility] as Pharmacist (Pharmacist)  REFERRING PROVIDER: Lavera Guise, MD  CHIEF COMPLAINTS/REASON FOR VISIT:  Follow-up for breast cancer  HISTORY OF PRESENTING ILLNESS:   Dawn Alvarez is a  65 y.o.  female with PMH listed below was seen in consultation at the request of  Lavera Guise, MD  for evaluation of breast cancer Patient had screening mammogram done 08/12/2017 which showed right breast asymmetry.  A diagnostic mammogram was suggested and patient did not have it done. Patient felt her right breast mass for a few weeks.  She had bilateral diagnostic mammogram done on 06/30/2019. 2.8 x 2.4 x 2.6 cm right breast mass, 11:00, 10 cm from the nipple.  There is a single mildly abnormal node in the right axilla with a cortex measuring up to 4.4 mm.  No other suspicious findings. Patient underwent ultrasound-guided core biopsy of the right breast mass and right axilla lymph node Pathology showed invasive mammary carcinoma, no special type, grade 3, ER/PR HER-2 status are pending. Right axillary lymph node biopsy showed predominantly blood in the fibroadipose tissue, with scant lymphoid tissue present.  No definite malignancy was identified.  Patient was referred to cancer center to establish care and discuss treatment plan. Menarche 24 Postmenopausal.  LMP when she was 65 years old. She recalls use of birth control pills. Denies any hormone .  Replacement therapy. Denies any prior chest radiation. She reports family history of maternal grandmother and 2  maternal cousins were diagnosed with cancer.  She does not know about details. # 09/03/2019, patient had an episode of syncope and EMS was called and patient sent to emergency room.  Patient was noted to have a heart rate 45-65 and blood glucose level of 286. Patient denies any history of nausea, vomiting, diarrhea.  Denies any dehydration.  She reported history of intermittent abdominal pain. CT head without contrast showed no acute intracranial abnormality.  CT abdomen pelvis with contrast is negative for evidence of acute abdominal abnormality.  Patient was given IV fluid. Patient was observed with holding her blood pressure medication including amlodipine, bisoprolol.  No arrhythmia was noted on telemetry.  Echocardiogram showed LVEF 60 to 65%.  No valvular abnormalities.  Beta-blocker and diuretics was discontinued.  # Patient was seen by cardiology Dr. Garen Lah for evaluation of syncope and was cleared for chemotherapy. # 10/03/2019, interval unilateral right diagnostic mammogram showed right breast mass 11:00 size has decreased to 3.7 x 1.3 x 2.6 cm.  Previously mass measured 2.8 x 2.4 x 2.6 cm  Neoadjuvant chemotherapy  08/04/2019-09/21/2019 dose dense AC 10/11/2019-01/02/2020 weekly Taxol mammogram after chemotherapy showed decreased size of cancer at that time and was sent back to surgeon for surgery. Patient no showed to Dr. Ethlyn Gallery office in September 2021  03/16/2020 bilateral breast MRI with and without contrast showed previously biopsied mass in the posterior aspect of the upper outer quadrant of the right breast is no longer visualized.  There is a small amount of low-grade linear enhancement extending posteriorly and superiorly from the location towards the right axilla.  Measuring 5.2 x 2.0 x 1.1 cm.  No  evidence of malignancy elsewhere in either breast.  No adenopathy.  11/03/2019 genetic testing is negative   # 05/04/2020 right lumpectomy SLNB ypTis ypN0 High-grade DCIS with  calcifications, 1 cm, no residual invasive carcinoma.  Margins not involved.  Extensive fibrosis with patchy mild chronic inflammation.  Patient had 5 sentinel lymph node excision and all lymph nodes were negative. Margins were negative  #Adjuvant radiation finished on 08/23/2020. INTERVAL HISTORY Dawn Alvarez is a 65 y.o. female who has above history reviewed by me today presents for follow up visit for management of breast cancer. Problems and complaints are listed below: Patient reports tolerating chemotherapy well. Patient has stable neuropathy.  Gabapentin 100 mg 3 times daily helped relieving her symptoms. Some right breast swelling. e daily with some relief of her symptoms. . Review of Systems  Constitutional: Negative for appetite change, chills, fatigue and fever.  HENT:   Negative for hearing loss and voice change.   Eyes: Negative for eye problems.  Respiratory: Negative for chest tightness and cough.   Cardiovascular: Negative for chest pain.  Gastrointestinal: Negative for abdominal distention, abdominal pain and blood in stool.  Endocrine: Negative for hot flashes.  Genitourinary: Negative for difficulty urinating and frequency.   Musculoskeletal: Positive for arthralgias.  Skin: Negative for itching and rash.  Neurological: Positive for numbness. Negative for extremity weakness.  Hematological: Negative for adenopathy.  Psychiatric/Behavioral: Negative for confusion.    MEDICAL HISTORY:  Past Medical History:  Diagnosis Date  . Asthma   . Cancer ([place]) 03/2020   right breast IMC  . COPD exacerbation ([place]) 04/12/2016  . Diabetes mellitus without complication ([place])   . Hyperlipemia   . Hypertension   . Personal history of chemotherapy 2021    SURGICAL HISTORY: Past Surgical History:  Procedure Laterality Date  . BREAST BIOPSY Right 07/05/2019   [place] bx venus marker, grade 3 invasive mammary carcinoma  . BREAST BIOPSY Right 07/05/2019   LN bx,  hydromarker,PREDOMINANTLY BLOOD AND FIBROADIPOSE TISSUE, WITH SCANT LYMPHOID TISSUE PRESENT      . BREAST LUMPECTOMY WITH RADIOACTIVE SEED AND SENTINEL LYMPH NODE BIOPSY Right 05/04/2020   Procedure: RIGHT BREAST LUMPECTOMY WITH BRACKETED RADIOACTIVE SEED AND SENTINEL LYMPH NODE BIOPSY;  Surgeon: Jovita Kussmaul, MD;  Location: [facility];  Service: General;  Laterality: Right;  . CESAREAN SECTION    . COLONOSCOPY WITH PROPOFOL N/A 09/11/2015   Procedure: COLONOSCOPY WITH PROPOFOL;  Surgeon: Lucilla Lame, MD;  Location: [facility];  Service: Endoscopy;  Laterality: N/A;  . IR IMAGING GUIDED PORT INSERTION  07/20/2019    SOCIAL HISTORY: Social History   Socioeconomic History  . Marital status: Married    Spouse name: Not on file  . Number of children: Not on file  . Years of education: Not on file  . Highest education level: Not on file  Occupational History  . Not on file  Tobacco Use  . Smoking status: Former Smoker    Packs/day: 1.00    Years: 2.00    Pack years: 2.00    Types: Cigarettes    Quit date: 02/29/2020    Years since quitting: 0.6  . Smokeless tobacco: Never Used  Substance and Sexual Activity  . Alcohol use: Yes    Comment: social  . Drug use: No  . Sexual activity: Not Currently    Birth control/protection: Post-menopausal  Other Topics Concern  . Not on file  Social History Narrative  . Not on file   Social Determinants  of Health   Financial Resource Strain: Not on file  Food Insecurity: Not on file  Transportation Needs: Not on file  Physical Activity: Not on file  Stress: Not on file  Social Connections: Not on file  Intimate Partner Violence: Not on file    FAMILY HISTORY: Family History  Problem Relation Age of Onset  . Diabetes Mother   . Hypertension Mother   . Hypertension Father   . Diabetes Father   . Breast cancer Neg Hx     ALLERGIES:  has No Known Allergies.  MEDICATIONS:  Current Outpatient Medications   Medication Sig Dispense Refill  . Alcohol Swabs (B-D SINGLE USE SWABS REGULAR) PADS Use as directed twice a day 300 each 3  . amLODipine (NORVASC) 10 MG tablet Take 1/2 tablet po QD 45 tablet 3  . Blood Glucose Monitoring Suppl (TRUE METRIX AIR GLUCOSE METER) w/Device KIT 1 Device by Does not apply route in the morning and at bedtime. Use ad directed e11.65 1 kit 0  . brimonidine (ALPHAGAN) 0.2 % ophthalmic solution Place 1 drop into both eyes 2 (two) times daily.    . Calcium Carb-Cholecalciferol (CALCIUM 600 + D) 600-200 MG-UNIT TABS Take 1 tablet by mouth daily.    Marland Kitchen gabapentin (NEURONTIN) 100 MG capsule Take 1 capsule (100 mg total) by mouth 3 (three) times daily. 270 capsule 1  . HYDROcodone-acetaminophen (NORCO/VICODIN) 5-325 MG tablet Take 1-2 tablets by mouth every 6 (six) hours as needed for moderate pain or severe pain. 15 tablet 0  . Insulin Pen Needle (PEN NEEDLES) 31G X 8 MM MISC Use as directed with insulin E11.65 100 each 1  . Lancets Misc. (ACCU-CHEK MULTICLIX LANCET DEV) KIT by Does not apply route. Use as directed twice a day diag E11.65    . lidocaine-prilocaine (EMLA) cream SMARTSIG:1 Topical Every Night    . lisinopril (ZESTRIL) 30 MG tablet Take one tab po qd for HTN 90 tablet 3  . Omega-3 Fatty Acids (FISH OIL) 1000 MG CAPS Take 1,000 mg by mouth daily.     . Semaglutide, 1 MG/DOSE, (OZEMPIC, 1 MG/DOSE,) 2 MG/1.5ML SOPN Inject 1 mg into the skin once a week. FOR DM 6 mL 6  . SIMBRINZA 1-0.2 % SUSP     . TRUEplus Lancets 30G MISC Use as directed twice daily diag e11.65 100 each 3  . atorvastatin (LIPITOR) 10 MG tablet Take 1 tablet (10 mg total) by mouth daily. (Patient not taking: Reported on 10/22/2020) 90 tablet 3  . glucose blood (TRUE METRIX BLOOD GLUCOSE TEST) test strip Use as instructed twice a daily diag e11.65 (Patient not taking: Reported on 10/22/2020) 100 each 3  . insulin detemir (LEVEMIR FLEXTOUCH) 100 UNIT/ML FlexPen Inject 44 Units into the skin daily. ALONG  WITH NEEDLES (Patient not taking: Reported on 10/22/2020) 15 mL 3   No current facility-administered medications for this visit.   Facility-Administered Medications Ordered in Other Visits  Medication Dose Route Frequency Provider Last Rate Last Admin  . sodium chloride flush (NS) 0.9 % injection 10 mL  10 mL Intravenous PRN Earlie Server, MD   10 mL at 10/11/19 0817     PHYSICAL EXAMINATION: ECOG PERFORMANCE STATUS: 1 - Symptomatic but completely ambulatory Vitals:   10/22/20 1006  BP: 121/79  Pulse: 80  Resp: 16  Temp: (!) 96.8 F (36 C)   Filed Weights   10/22/20 1006  Weight: 164 lb 6.4 oz (74.6 kg)    Physical Exam Constitutional:  General: She is not in acute distress. HENT:     Head: Normocephalic and atraumatic.  Eyes:     General: No scleral icterus. Cardiovascular:     Rate and Rhythm: Normal rate and regular rhythm.     Heart sounds: Normal heart sounds.  Pulmonary:     Effort: Pulmonary effort is normal. No respiratory distress.     Breath sounds: Normal breath sounds. No wheezing.  Abdominal:     General: Bowel sounds are normal. There is no distension.     Palpations: Abdomen is soft.  Musculoskeletal:        General: No deformity. Normal range of motion.     Cervical back: Normal range of motion and neck supple.  Skin:    General: Skin is warm and dry.     Findings: No erythema or rash.     Comments: Nail hyperpigmentation changes  Neurological:     Mental Status: She is alert and oriented to person, place, and time. Mental status is at baseline.     Cranial Nerves: No cranial nerve deficit.     Coordination: Coordination normal.  Psychiatric:        Mood and Affect: Mood normal.   Right breast skin hyperpigmentation with right lower quadrant tissue swelling/soft tissue thickening    LABORATORY DATA:  I have reviewed the data as listed Lab Results  Component Value Date   WBC 5.6 10/22/2020   HGB 11.6 (L) 10/22/2020   HCT 34.2 (L)  10/22/2020   MCV 85.3 10/22/2020   PLT 252 10/22/2020   Recent Labs    12/16/19 0841 12/23/19 0828 01/02/20 0848 03/22/20 0951 05/03/20 1507 10/04/20 0851 10/22/20 0935  NA 136 138 136 139 139 139 137  K 3.9 3.8 3.6 4.0 4.7 4.0 3.8  CL 103 106 106 105 106 101 103  CO2 '26 25 22 26 23 23 23  ' GLUCOSE 154* 162* 190* 79 126* 121* 151*  BUN '15 12 13 15 15 8 20  ' CREATININE 0.82 0.70 0.82 0.76 1.16* 0.82 0.84  CALCIUM 9.2 9.1 8.7* 9.4 9.7 9.3 9.1  GFRNONAA >60 >60 >60 >60 53*  --  >60  GFRAA >60 >60 >60  --   --   --   --   PROT 6.8 6.9 6.6 7.2  --  6.9 7.3  ALBUMIN 3.7 3.6 3.6 3.8  --  4.4 3.8  AST '20 19 19 18  ' --  17 25  ALT '17 16 15 15  ' --  14 16  ALKPHOS 33* 31* 39 28*  --  40* 35*  BILITOT 0.4 0.5 0.4 0.5  --  0.3 0.6   Iron/TIBC/Ferritin/ %Sat No results found for: IRON, TIBC, FERRITIN, IRONPCTSAT    RADIOGRAPHIC STUDIES: I have personally reviewed the radiological images as listed and agreed with the findings in the report. No results found.    ASSESSMENT & PLAN:  1. Malignant neoplasm of upper-outer quadrant of right breast in female, estrogen receptor negative ([place])   2. Chemotherapy-induced neuropathy ([place])   3. Port-A-Cath in place   4. 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  ER negative, PR weakly positive (<=10%), HER-2 negative Status post neoadjuvant dose dense AC followed by 12 weekly Taxol. Status post lumpectomy/sentinel lymph node biopsy ypTis ypN0 No residual invasive carcinoma.  Status post adjuvant radiation. Patient did not get immunotherapy in the neoadjuvant setting.I would not add immunotherapy in the adjuvant setting.  Right breast  swelling, skin hyperpigmentation I discussed with patient that this is likely secondary to radiation.  Right breast tissue thickening/swelling, likely radiation side effects, I will obtain unilateral right diagnostic mammogram for evaluation.   #Chemotherapy induced  neuropathy, grade 1-2.  Continue gabapentin 100 mg 3 times daily  #Port-A-Cath  Continue port flush every 8 weeks Recommend patient to keep Mediport for at least a year, prefer 2 years.   All questions were answered. The patient knows to call the clinic with any problems questions or concerns. Return of visit 4 months  Earlie Server, MD, PhD Hematology Oncology [facility] Pager- [phone] 10/22/2020

## 2020-10-23 ENCOUNTER — Ambulatory Visit
Admission: RE | Admit: 2020-10-23 | Discharge: 2020-10-23 | Disposition: A | Discharge status: Home / Self Care | Payer: Medicare Other | Source: Ambulatory Visit | Attending: Oncology | Admitting: Oncology

## 2020-10-23 ENCOUNTER — Other Ambulatory Visit: Payer: Self-pay

## 2020-10-23 ENCOUNTER — Encounter: Payer: Self-pay | Admitting: Oncology

## 2020-10-23 DIAGNOSIS — N63 Unspecified lump in unspecified breast: Secondary | ICD-10-CM

## 2020-10-23 DIAGNOSIS — Z171 Estrogen receptor negative status [ER-]: Secondary | ICD-10-CM | POA: Diagnosis present

## 2020-10-23 DIAGNOSIS — C50411 Malignant neoplasm of upper-outer quadrant of right female breast: Secondary | ICD-10-CM | POA: Insufficient documentation

## 2020-10-23 LAB — CANCER ANTIGEN 15-3: CA 15-3: 12.5 U/mL (ref 0.0–25.0)

## 2020-10-23 LAB — CANCER ANTIGEN 27.29: CA 27.29: 13.2 U/mL (ref 0.0–38.6)

## 2020-10-24 ENCOUNTER — Ambulatory Visit: Payer: Medicaid Other | Admitting: Pharmacist

## 2020-10-24 ENCOUNTER — Other Ambulatory Visit: Payer: Self-pay

## 2020-10-24 DIAGNOSIS — E1165 Type 2 diabetes mellitus with hyperglycemia: Secondary | ICD-10-CM

## 2020-10-24 DIAGNOSIS — E782 Mixed hyperlipidemia: Secondary | ICD-10-CM

## 2020-10-24 DIAGNOSIS — I10 Essential (primary) hypertension: Secondary | ICD-10-CM

## 2020-10-24 MED ORDER — TRUEPLUS LANCETS 30G MISC: 3 refills | Status: DC

## 2020-10-24 MED ORDER — TRUE METRIX BLOOD GLUCOSE TEST VI STRP: ORAL_STRIP | 3 refills | Status: DC

## 2020-10-24 MED ORDER — TRUE METRIX AIR GLUCOSE METER W/DEVICE KIT
1.0000 | PACK | Freq: Two times a day (BID) | 0 refills | Status: DC
Start: 2020-10-24 — End: 2023-11-24

## 2020-10-24 NOTE — Patient Instructions (Addendum)
Visit Information  Goals Addressed            This Visit's Progress   . Monitor and Manage My Blood Sugar-Diabetes Type 2       Timeframe:  Long-Range Goal Priority:  High Start Date:  10/24/20                           Expected End Date:   04/26/21                    Follow Up Date 01/24/21   - check blood sugar at prescribed times - check blood sugar if I feel it is too high or too low - enter blood sugar readings and medication or insulin into daily log - take the blood sugar log to all doctor visits    Why is this important?    Checking your blood sugar at home helps to keep it from getting very high or very low.   Writing the results in a diary or log helps the doctor know how to care for you.   Your blood sugar log should have the time, date and the results.   Also, write down the amount of insulin or other medicine that you take.   Other information, like what you ate, exercise done and how you were feeling, will also be helpful.     Notes:       Patient Care Plan: General Pharmacy (Adult)    Problem Identified: HTN, HLD, DM   Priority: High  Onset Date: 10/24/2020    Long-Range Goal: Patient-Specific Goal   Start Date: 10/24/2020  Expected End Date: 04/26/2021  This Visit's Progress: On track  Priority: High  Note:   Current Barriers:  . Unable to achieve control of cholesterol   Pharmacist Clinical Goal(s):  Marland Kitchen Patient will achieve control of cholesterol as evidenced by follow up labs through collaboration with PharmD and provider.   Interventions: . 1:1 collaboration with Lavera Guise, MD regarding development and update of comprehensive plan of care as evidenced by provider attestation and co-signature . Inter-disciplinary care team collaboration (see longitudinal plan of care) . Comprehensive medication review performed; medication list updated in electronic medical record  Hypertension (BP goal <130/80) -Controlled -Current  treatment: . Amlodipine 5mg  daily - one half tablet daily . Lisinopril 30mg  daily -Medications previously tried: hydralazine, bisoprolol/HCTZ  -Current home readings: none noted, not checking -Current dietary habits: mentions mainly drinking water -Current exercise habits: "walks all the time" -Denies hypotensive/hypertensive symptoms -Educated on BP goals and benefits of medications for prevention of heart attack, stroke and kidney damage; Daily salt intake goal < 2300 mg; Exercise goal of 150 minutes per week; Importance of home blood pressure monitoring; -Counseled to monitor BP at home at least a few times per week, document, and provide log at future appointments -Recommended to continue current medication Recommended home monitoring, call provider if BP consistently > 130/90  Hyperlipidemia: (LDL goal < 70) -Uncontrolled -Current treatment: . None - patient was not taking her atorvastatin -Medications previously tried: pravastatin, gemfibrozil   -Current exercise habits: walks alot -Educated on Cholesterol goals;  Benefits of statin for ASCVD risk reduction; Importance of limiting foods high in cholesterol; Exercise goal of 150 minutes per week;  -Reviewed most recent LDL - elevated to 143 -Discussed risk of stroke/heart attack and need for medication to help reduce this risk -Recommended Atorvastatin 20mg  daily, reinforced importance of adherence with  this medication and to work on eliminating fatty and fried foods from diet.  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 Levemir 30 units once daily . Ozempic 1mg  once weekly . Gabapentin 100mg  tid - recent increase -Medications previously tried: glyburide  -Current home glucose readings . fasting glucose: not checking . post prandial glucose: not checking -Denies hypoglycemic/hyperglycemic symptoms -Current exercise: walks a lot -Educated on A1c and blood sugar goals; Complications of  diabetes including kidney damage, retinal damage, and cardiovascular disease; Prevention and management of hypoglycemic episodes; Benefits of routine self-monitoring of blood sugar; -Counseled to check feet daily and get yearly eye exams -Does reports some neuropathy in her feet that comes and goes -Recommended to continue current medication Recommended patient check her fasting blood sugar at least a few times per week, she does not have a meter at [facility] so I will request we call in Rx for meter and testing supplies today.     Patient Goals/Self-Care Activities . Patient will:  - take medications as prescribed check glucose a few times per week, document, and provide at future appointments check blood pressure a few times per week, document, and provide at future appointments target a minimum of 150 minutes of moderate intensity exercise weekly  Follow Up Plan: The care management team will reach out to the patient again over the next 120 days.        Dawn Alvarez was given information about Chronic Care Management services today including:  1. CCM service includes personalized support from designated clinical staff supervised by her physician, including individualized plan of care and coordination with other care providers 2. 24/7 contact phone numbers for assistance for urgent and routine care needs. 3. Standard insurance, coinsurance, copays and deductibles apply for chronic care management only during months in which we provide at least 20 minutes of these services. Most insurances cover these services at 100%, however patients may be responsible for any copay, coinsurance and/or deductible if applicable. This service may help you avoid the need for more expensive face-to-face services. 4. Only one practitioner may furnish and bill the service in a calendar month. 5. The patient may stop CCM services at any time (effective at the end of the month) by phone call to the office  staff.  Patient agreed to services and verbal consent obtained.   The patient verbalized understanding of instructions, educational materials, and care plan provided today and agreed to receive a mailed copy of patient instructions, educational materials, and care plan.  Telephone follow up appointment with pharmacy team member scheduled for: 4 months  Edythe Clarity, [facility]  High Cholesterol  High cholesterol is a condition in which the blood has high levels of a white, waxy substance similar to fat (cholesterol). The liver makes all the cholesterol that the body needs. The human body needs small amounts of cholesterol to help build cells. A person gets extra or excess cholesterol from the food that he or she eats. The blood carries cholesterol from the liver to the rest of the body. If you have high cholesterol, deposits (plaques) may build up on the walls of your arteries. Arteries are the blood vessels that carry blood away from your heart. These plaques make the arteries narrow and stiff. Cholesterol plaques increase your risk for heart attack and stroke. Work with your health care provider to keep your cholesterol levels in a healthy range. What increases the risk? The following factors may make you more likely to develop  this condition:  Eating foods that are high in animal fat (saturated fat) or cholesterol.  Being overweight.  Not getting enough exercise.  A family history of high cholesterol (familial hypercholesterolemia).  Use of tobacco products.  Having diabetes. What are the signs or symptoms? There are no symptoms of this condition. How is this diagnosed? This condition may be diagnosed based on the results of a blood test.  If you are older than 65 years of age, your health care provider may check your cholesterol levels every 4-6 years.  You may be checked more often if you have high cholesterol or other risk factors for heart disease. The blood test for  cholesterol measures:  "Bad" cholesterol, or LDL cholesterol. This is the main type of cholesterol that causes heart disease. The desired level is less than 100 mg/dL.  "Good" cholesterol, or HDL cholesterol. HDL helps protect against heart disease by cleaning the arteries and carrying the LDL to the liver for processing. The desired level for HDL is 60 mg/dL or higher.  Triglycerides. These are fats that your body can store or burn for energy. The desired level is less than 150 mg/dL.  Total cholesterol. This measures the total amount of cholesterol in your blood and includes LDL, HDL, and triglycerides. The desired level is less than 200 mg/dL. How is this treated? This condition may be treated with:  Diet changes. You may be asked to eat foods that have more fiber and less saturated fats or added sugar.  Lifestyle changes. These may include regular exercise, maintaining a healthy weight, and quitting use of tobacco products.  Medicines. These are given when diet and lifestyle changes have not worked. You may be prescribed a statin medicine to help lower your cholesterol levels. Follow these instructions at home: Eating and drinking  Eat a healthy, balanced diet. This diet includes: ? Daily servings of a variety of fresh, frozen, or canned fruits and vegetables. ? Daily servings of whole grain foods that are rich in fiber. ? Foods that are low in saturated fats and trans fats. These include poultry and fish without skin, lean cuts of meat, and low-fat dairy products. ? A variety of fish, especially oily fish that contain omega-3 fatty acids. Aim to eat fish at least 2 times a week.  Avoid foods and drinks that have added sugar.  Use healthy cooking methods, such as roasting, grilling, broiling, baking, poaching, steaming, and stir-frying. Do not fry your food except for stir-frying.   Lifestyle  Get regular exercise. Aim to exercise for a total of 150 minutes a week. Increase your  activity level by doing activities such as gardening, walking, and taking the stairs.  Do not use any products that contain nicotine or tobacco, such as cigarettes, e-cigarettes, and chewing tobacco. If you need help quitting, ask your health care provider.   General instructions  Take over-the-counter and prescription medicines only as told by your health care provider.  Keep all follow-up visits as told by your health care provider. This is important. Where to find more information  American Heart Association: www.heart.org  National Heart, Lung, and Blood Institute: https://wilson-eaton.com/ Contact a health care provider if:  You have trouble achieving or maintaining a healthy diet or weight.  You are starting an exercise program.  You are unable to stop smoking. Get help right away if:  You have chest pain.  You have trouble breathing.  You have any symptoms of a stroke. "BE FAST" is an easy way  to remember the main warning signs of a stroke: ? B - Balance. Signs are dizziness, sudden trouble walking, or loss of balance. ? E - Eyes. Signs are trouble seeing or a sudden change in vision. ? F - Face. Signs are sudden weakness or numbness of the face, or the face or eyelid drooping on one side. ? A - Arms. Signs are weakness or numbness in an arm. This happens suddenly and usually on one side of the body. ? S - Speech. Signs are sudden trouble speaking, slurred speech, or trouble understanding what people say. ? T - Time. Time to call emergency services. Write down what time symptoms started.  You have other signs of a stroke, such as: ? A sudden, severe headache with no known cause. ? Nausea or vomiting. ? Seizure. These symptoms may represent a serious problem that is an emergency. Do not wait to see if the symptoms will go away. Get medical help right away. Call your local emergency services (911 in the [place] Do not drive yourself to the hospital. Summary  Cholesterol plaques  increase your risk for heart attack and stroke. Work with your health care provider to keep your cholesterol levels in a healthy range.  Eat a healthy, balanced diet, get regular exercise, and maintain a healthy weight.  Do not use any products that contain nicotine or tobacco, such as cigarettes, e-cigarettes, and chewing tobacco.  Get help right away if you have any symptoms of a stroke. This information is not intended to replace advice given to you by your health care provider. Make sure you discuss any questions you have with your health care provider. Document Revised: 04/18/2019 Document Reviewed: 04/18/2019 Elsevier Patient Education  2021 [employer].

## 2020-10-29 ENCOUNTER — Other Ambulatory Visit: Payer: Self-pay | Admitting: Nurse Practitioner

## 2020-10-29 DIAGNOSIS — E782 Mixed hyperlipidemia: Secondary | ICD-10-CM

## 2020-10-29 MED ORDER — ATORVASTATIN CALCIUM 20 MG PO TABS: 20.0000 mg | ORAL_TABLET | Freq: Every day | ORAL | 1 refills | Status: DC

## 2020-10-30 DIAGNOSIS — I10 Essential (primary) hypertension: Secondary | ICD-10-CM | POA: Diagnosis not present

## 2020-10-30 DIAGNOSIS — J441 Chronic obstructive pulmonary disease with (acute) exacerbation: Secondary | ICD-10-CM | POA: Diagnosis not present

## 2020-10-30 DIAGNOSIS — E1165 Type 2 diabetes mellitus with hyperglycemia: Secondary | ICD-10-CM | POA: Diagnosis not present

## 2020-11-22 ENCOUNTER — Other Ambulatory Visit: Payer: Self-pay

## 2020-11-22 ENCOUNTER — Telehealth: Payer: Self-pay

## 2020-11-22 NOTE — Telephone Encounter (Signed)
Pt insurance send paper that true metrix test strips not covered preferred meter will one touch spoke with pt had enough strips until her next appt we can give her new meter and send teststrips at her visit

## 2020-12-10 ENCOUNTER — Encounter: Payer: Self-pay | Admitting: Oncology

## 2020-12-17 ENCOUNTER — Other Ambulatory Visit: Payer: Self-pay

## 2020-12-17 ENCOUNTER — Inpatient Hospital Stay: Payer: Medicare (Managed Care) | Attending: Oncology

## 2020-12-17 DIAGNOSIS — Z95828 Presence of other vascular implants and grafts: Secondary | ICD-10-CM

## 2020-12-17 DIAGNOSIS — Z171 Estrogen receptor negative status [ER-]: Secondary | ICD-10-CM | POA: Diagnosis not present

## 2020-12-17 DIAGNOSIS — C50411 Malignant neoplasm of upper-outer quadrant of right female breast: Secondary | ICD-10-CM

## 2020-12-17 DIAGNOSIS — Z452 Encounter for adjustment and management of vascular access device: Secondary | ICD-10-CM | POA: Insufficient documentation

## 2020-12-17 MED ORDER — HEPARIN SOD (PORK) LOCK FLUSH 100 UNIT/ML IV SOLN
INTRAVENOUS | Status: AC
  Filled 2020-12-17: qty 5

## 2020-12-17 MED ORDER — HEPARIN SOD (PORK) LOCK FLUSH 100 UNIT/ML IV SOLN
500.0000 [IU] | Freq: Once | INTRAVENOUS | Status: AC
  Administered 2020-12-17: 500 [IU] via INTRAVENOUS
  Filled 2020-12-17: qty 5

## 2020-12-17 MED ORDER — SODIUM CHLORIDE 0.9% FLUSH
10.0000 mL | INTRAVENOUS | Status: DC | PRN
  Administered 2020-12-17: 10 mL via INTRAVENOUS
  Filled 2020-12-17: qty 10

## 2020-12-17 NOTE — Progress Notes (Signed)
Survivorship Care Plan visit completed.  Treatment summary reviewed and given to patient.  ASCO answers booklet reviewed and given to patient.  CARE program and Cancer Transitions discussed with patient along with other resources cancer center offers to patients and caregivers.  Patient verbalized understanding.    

## 2020-12-18 ENCOUNTER — Encounter: Payer: Self-pay | Admitting: Oncology

## 2020-12-25 ENCOUNTER — Encounter: Payer: Self-pay | Admitting: Oncology

## 2020-12-26 ENCOUNTER — Encounter: Payer: Self-pay | Admitting: Oncology

## 2021-01-01 ENCOUNTER — Other Ambulatory Visit: Payer: Self-pay

## 2021-01-01 ENCOUNTER — Encounter: Payer: Self-pay | Admitting: Nurse Practitioner

## 2021-01-01 ENCOUNTER — Ambulatory Visit (INDEPENDENT_AMBULATORY_CARE_PROVIDER_SITE_OTHER): Payer: Medicare Other | Admitting: Nurse Practitioner

## 2021-01-01 VITALS — BP 140/88 | HR 87 | Temp 97.1°F | Resp 16 | Ht 61.0 in | Wt 161.8 lb

## 2021-01-01 DIAGNOSIS — Z794 Long term (current) use of insulin: Secondary | ICD-10-CM | POA: Diagnosis not present

## 2021-01-01 DIAGNOSIS — I10 Essential (primary) hypertension: Secondary | ICD-10-CM | POA: Diagnosis not present

## 2021-01-01 DIAGNOSIS — E1165 Type 2 diabetes mellitus with hyperglycemia: Secondary | ICD-10-CM | POA: Diagnosis not present

## 2021-01-01 DIAGNOSIS — E782 Mixed hyperlipidemia: Secondary | ICD-10-CM

## 2021-01-01 DIAGNOSIS — Z23 Encounter for immunization: Secondary | ICD-10-CM | POA: Diagnosis not present

## 2021-01-01 DIAGNOSIS — G629 Polyneuropathy, unspecified: Secondary | ICD-10-CM | POA: Diagnosis not present

## 2021-01-01 LAB — POCT GLYCOSYLATED HEMOGLOBIN (HGB A1C): Hemoglobin A1C: 6 % — AB (ref 4.0–5.6)

## 2021-01-01 MED ORDER — PNEUMOCOCCAL 20-VAL CONJ VACC 0.5 ML IM SUSY: 0.5000 mL | PREFILLED_SYRINGE | INTRAMUSCULAR | 0 refills | Status: AC

## 2021-01-01 MED ORDER — TETANUS-DIPHTH-ACELL PERTUSSIS 5-2.5-18.5 LF-MCG/0.5 IM SUSP: 0.5000 mL | Freq: Once | INTRAMUSCULAR | 0 refills | Status: AC

## 2021-01-01 MED ORDER — ZOSTER VAC RECOMB ADJUVANTED 50 MCG/0.5ML IM SUSR: 0.5000 mL | Freq: Once | INTRAMUSCULAR | 0 refills | Status: AC

## 2021-01-01 MED ORDER — BRIMONIDINE TARTRATE 0.2 % OP SOLN: 1.0000 [drp] | Freq: Two times a day (BID) | OPHTHALMIC | 3 refills | Status: DC

## 2021-01-01 MED ORDER — OZEMPIC (1 MG/DOSE) 2 MG/1.5ML ~~LOC~~ SOPN: 1.0000 mg | PEN_INJECTOR | SUBCUTANEOUS | 6 refills | Status: DC

## 2021-01-01 MED ORDER — GABAPENTIN 100 MG PO CAPS: 200.0000 mg | ORAL_CAPSULE | Freq: Three times a day (TID) | ORAL | 2 refills | Status: DC

## 2021-01-01 NOTE — Progress Notes (Signed)
[facility] [place], [place] [address]  Internal MEDICINE  Office Visit Note  Patient Name: Dawn Alvarez  [id number]  [id number]  Date of Service: 01/01/2021  Chief Complaint  Patient presents with   Follow-up    Still hurts at incision site where pt had surgery, she has told surgeon, please review gaps   Depression   Hyperlipidemia   Hypertension   COPD   Asthma   Quality Metric Gaps    Covid vaccine needed, tdap, shingrix, lipid panel, pneumovax, foot exam, flu    HPI Dawn Alvarez presents for a follow up visit for diabetes, hypertension, COPD. She also has a history of asthma, right breast cancer with recent surgery, hyperlipidemia, and depression. She reports some nerve pain near the incision site of the right breast and she also has peripheral neuropathy that has been bothering her.  -Her blood pressure is elevated today, she takes lisinopril and amlodipine.  -she is taking atorvastatin 20 mg daily for hyperlipidemia.  -she reports that she will get the COVID vaccine. She has not had any doses yet.  -She is due for a diabetic foot exam today.  -A1C was chaecked today and it is 6.0. It was 6.6 in may 2022.    Current Medication: Outpatient Encounter Medications as of 01/01/2021  Medication Sig Note   Alcohol Swabs (B-D SINGLE USE SWABS REGULAR) PADS Use as directed twice a day    amLODipine (NORVASC) 10 MG tablet Take 1/2 tablet po QD    atorvastatin (LIPITOR) 20 MG tablet Take 1 tablet (20 mg total) by mouth daily.    Blood Glucose Monitoring Suppl (TRUE METRIX AIR GLUCOSE METER) w/Device KIT 1 Device by Does not apply route in the morning and at bedtime. Use ad directed e11.65    Calcium Carb-Cholecalciferol (CALCIUM 600 + D) 600-200 MG-UNIT TABS Take 1 tablet by mouth daily.    gabapentin (NEURONTIN) 100 MG capsule Take 2 capsules (200 mg total) by mouth 3 (three) times daily.    glucose blood (TRUE METRIX BLOOD GLUCOSE TEST) test strip Use as  instructed twice a daily diag e11.65    HYDROcodone-acetaminophen (NORCO/VICODIN) 5-325 MG tablet Take 1-2 tablets by mouth every 6 (six) hours as needed for moderate pain or severe pain.    insulin detemir (LEVEMIR FLEXTOUCH) 100 UNIT/ML FlexPen Inject 44 Units into the skin daily. ALONG WITH NEEDLES 10/24/2020: Patient taking 30 units    Insulin Pen Needle (PEN NEEDLES) 31G X 8 MM MISC Use as directed with insulin E11.65    Lancets Misc. (ACCU-CHEK MULTICLIX LANCET DEV) KIT by Does not apply route. Use as directed twice a day diag E11.65    lidocaine-prilocaine (EMLA) cream SMARTSIG:1 Topical Every Night    lisinopril (ZESTRIL) 30 MG tablet Take one tab po qd for HTN    Omega-3 Fatty Acids (FISH OIL) 1000 MG CAPS Take 1,000 mg by mouth daily.     SIMBRINZA 1-0.2 % SUSP     TRUEplus Lancets 30G MISC Use as directed twice daily diag e11.65    [DISCONTINUED] brimonidine (ALPHAGAN) 0.2 % ophthalmic solution Place 1 drop into both eyes 2 (two) times daily.    [DISCONTINUED] gabapentin (NEURONTIN) 100 MG capsule Take 1 capsule (100 mg total) by mouth 3 (three) times daily.    [DISCONTINUED] pneumococcal 20-Val Conj Vacc (PREVNAR 20) 0.5 ML injection Inject 0.5 mLs into the muscle tomorrow at 10 am.    [DISCONTINUED] Semaglutide, 1 MG/DOSE, (OZEMPIC, 1 MG/DOSE,) 2 MG/1.5ML SOPN Inject  1 mg into the skin once a week. FOR DM    [DISCONTINUED] Tdap (BOOSTRIX) 5-2.5-18.5 LF-MCG/0.5 injection Inject 0.5 mLs into the muscle once.    [DISCONTINUED] Zoster Vaccine Adjuvanted [facility]) injection Inject 0.5 mLs into the muscle once.    brimonidine (ALPHAGAN) 0.2 % ophthalmic solution Place 1 drop into both eyes 2 (two) times daily.    [EXPIRED] pneumococcal 20-Val Conj Vacc (PREVNAR 20) 0.5 ML injection Inject 0.5 mLs into the muscle tomorrow at 10 am for 1 dose.    Semaglutide, 1 MG/DOSE, (OZEMPIC, 1 MG/DOSE,) 2 MG/1.5ML SOPN Inject 1 mg into the skin once a week. FOR DM    [EXPIRED] Tdap (BOOSTRIX) 5-2.5-18.5  LF-MCG/0.5 injection Inject 0.5 mLs into the muscle once for 1 dose.    [EXPIRED] Zoster Vaccine Adjuvanted [facility]) injection Inject 0.5 mLs into the muscle once for 1 dose.    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   BREAST LUMPECTOMY WITH RADIOACTIVE SEED AND SENTINEL LYMPH NODE BIOPSY Right 05/04/2020   Procedure: RIGHT BREAST LUMPECTOMY WITH BRACKETED RADIOACTIVE SEED AND SENTINEL LYMPH NODE BIOPSY;  Surgeon: Jovita Kussmaul, MD;  Location: [place];  Service: General;  Laterality: Right;   CESAREAN SECTION     COLONOSCOPY WITH PROPOFOL N/A 09/11/2015   Procedure: COLONOSCOPY WITH PROPOFOL;  Surgeon: Lucilla Lame, MD;  Location: [facility];  Service: Endoscopy;  Laterality: N/A;   IR IMAGING GUIDED PORT INSERTION  07/20/2019    Medical History: Past Medical History:  Diagnosis Date   Asthma    Cancer ([place]) 03/2020   right breast IMC   COPD exacerbation ([place]) 04/12/2016   Diabetes mellitus without complication (HCC)    Hyperlipemia    Hypertension    Personal history of chemotherapy 2021    Family History: Family History  Problem Relation Age of Onset   Diabetes Mother    Hypertension Mother    Hypertension Father    Diabetes Father    Breast cancer Neg Hx     Social History   Socioeconomic History   Marital status: Married    Spouse name: Not on file   Number of children: Not on file   Years of education: Not on file   Highest education level: Not on  file  Occupational History   Not on file  Tobacco Use   Smoking status: Former    Packs/day: 1.00    Years: 2.00    Pack years: 2.00    Types: Cigarettes    Quit date: 02/29/2020    Years since quitting: 0.8   Smokeless tobacco: Never  Substance and Sexual Activity   Alcohol use: Yes    Comment: social   Drug use: No   Sexual activity: Not Currently    Birth control/protection: Post-menopausal  Other Topics Concern   Not on file  Social History Narrative   Not on file   Social Determinants of Health   Financial Resource Strain: Low Risk    Difficulty of Paying Living Expenses:  Not very hard  Food Insecurity: Not on file  Transportation Needs: Not on file  Physical Activity: Not on file  Stress: Not on file  Social Connections: Not on file  Intimate Partner Violence: Not on file      Review of Systems  Constitutional:  Negative for chills, fatigue and unexpected weight change.  HENT:  Negative for congestion, rhinorrhea, sneezing and sore throat.   Eyes:  Negative for redness.  Respiratory:  Negative for cough, chest tightness and shortness of breath.   Cardiovascular:  Negative for chest pain and palpitations.  Gastrointestinal:  Negative for abdominal pain, constipation, diarrhea, nausea and vomiting.  Genitourinary:  Negative for dysuria and frequency.  Musculoskeletal:  Negative for arthralgias, back pain, joint swelling and neck pain.  Skin:  Negative for rash.  Neurological:  Positive for numbness. Negative for dizziness, tremors, light-headedness and headaches.  Hematological:  Negative for adenopathy. Does not bruise/bleed easily.  Psychiatric/Behavioral:  Negative for behavioral problems (Depression), self-injury, sleep disturbance and suicidal ideas. The patient is not nervous/anxious.    Vital Signs: BP 140/88 Comment: 152/80  Pulse 87   Temp (!) 97.1 F (36.2 C)   Resp 16   Ht $R'5\' 1"'RQ$  (1.549 m)   Wt 161 lb 12.8 oz (73.4 kg)   SpO2 98%   BMI 30.57  kg/m    Physical Exam Vitals reviewed.  Constitutional:      General: She is not in acute distress.    Appearance: Normal appearance. She is obese. She is not ill-appearing.  Cardiovascular:     Rate and Rhythm: Normal rate and regular rhythm.     Pulses:          Dorsalis pedis pulses are 2+ on the right side and 2+ on the left side.       Posterior tibial pulses are 2+ on the right side and 2+ on the left side.  Pulmonary:     Effort: Pulmonary effort is normal. No respiratory distress.  Musculoskeletal:     Right foot: Normal range of motion. No deformity, bunion, foot drop or prominent metatarsal heads.     Left foot: Normal range of motion. No deformity, bunion, foot drop or prominent metatarsal heads.  Feet:     Right foot:     Protective Sensation: 6 sites tested.  6 sites sensed.     Skin integrity: Skin integrity normal. No ulcer, blister, skin breakdown, erythema, warmth, callus, dry skin or fissure.     Toenail Condition: Right toenails are abnormally thick.     Left foot:     Protective Sensation: 6 sites tested.  6 sites sensed.     Skin integrity: Skin integrity normal. No ulcer, blister, skin breakdown, erythema, warmth, callus, dry skin or fissure.     Toenail Condition: Left toenails are abnormally thick.  Skin:    General: Skin is warm and dry.     Capillary Refill: Capillary refill takes less than 2 seconds.  Neurological:     Mental Status: She is alert and oriented to person, place, and time.  Psychiatric:        Mood and Affect: Mood normal.        Behavior: Behavior normal.     Assessment/Plan: 1. Type 2 diabetes mellitus with hyperglycemia, with long-term current use of insulin (HCC) A1C has improved in the past 2 months. Ozempic refill ordered. Continue as prescribed.  - POCT glycosylated hemoglobin (Hb A1C) - Semaglutide, 1 MG/DOSE, (OZEMPIC, 1 MG/DOSE,) 2 MG/1.5ML  SOPN; Inject 1 mg into the skin once a week. FOR DM  Dispense: 6 mL; Refill: 6  2.  Essential hypertension Blood pressure is stable with current medications.   3. Mixed hyperlipidemia Taking atorvastatin, continue as prescribed.   4. Neuropathy Stable, refill ordered - gabapentin (NEURONTIN) 100 MG capsule; Take 2 capsules (200 mg total) by mouth 3 (three) times daily.  Dispense: 360 capsule; Refill: 2  5. Need for vaccination Orders sent to pharmacy.  - Zoster Vaccine Adjuvanted [facility]) injection; Inject 0.5 mLs into the muscle once for 1 dose.  Dispense: 0.5 mL; Refill: 0 - Tdap (BOOSTRIX) 5-2.5-18.5 LF-MCG/0.5 injection; Inject 0.5 mLs into the muscle once for 1 dose.  Dispense: 0.5 mL; Refill: 0 - pneumococcal 20-Val Conj Vacc (PREVNAR 20) 0.5 ML injection; Inject 0.5 mLs into the muscle tomorrow at 10 am for 1 dose.  Dispense: 0.5 mL; Refill: 0   General Counseling: Malissia verbalizes understanding of the findings of todays visit and agrees with plan of treatment. I have discussed any further diagnostic evaluation that may be needed or ordered today. We also reviewed her medications today. she has been encouraged to call the office with any questions or concerns that should arise related to todays visit.    Orders Placed This Encounter  Procedures   POCT glycosylated hemoglobin (Hb A1C)    Meds ordered this encounter  Medications   Zoster Vaccine Adjuvanted [facility]) injection    Sig: Inject 0.5 mLs into the muscle once for 1 dose.    Dispense:  0.5 mL    Refill:  0   Tdap (BOOSTRIX) 5-2.5-18.5 LF-MCG/0.5 injection    Sig: Inject 0.5 mLs into the muscle once for 1 dose.    Dispense:  0.5 mL    Refill:  0   pneumococcal 20-Val Conj Vacc (PREVNAR 20) 0.5 ML injection    Sig: Inject 0.5 mLs into the muscle tomorrow at 10 am for 1 dose.    Dispense:  0.5 mL    Refill:  0   gabapentin (NEURONTIN) 100 MG capsule    Sig: Take 2 capsules (200 mg total) by mouth 3 (three) times daily.    Dispense:  360 capsule    Refill:  2   brimonidine (ALPHAGAN) 0.2 %  ophthalmic solution    Sig: Place 1 drop into both eyes 2 (two) times daily.    Dispense:  5 mL    Refill:  3   Semaglutide, 1 MG/DOSE, (OZEMPIC, 1 MG/DOSE,) 2 MG/1.5ML SOPN    Sig: Inject 1 mg into the skin once a week. FOR DM    Dispense:  6 mL    Refill:  6    Return in about 3 months (around 04/03/2021) for F/U, Recheck A1C, Johny Pitstick PCP.   Total time spent:30 Minutes Time spent includes review of chart, medications, test results, and follow up plan with the patient.   [place] Controlled Substance Database was reviewed by me.  This patient was seen by [name], FNP-C in collaboration with Dr. Clayborn Bigness as a part of collaborative care agreement.   Rosena Bartle R. Valetta Fuller, MSN, FNP-C Internal medicine

## 2021-01-11 ENCOUNTER — Other Ambulatory Visit: Payer: Self-pay

## 2021-01-11 ENCOUNTER — Encounter: Payer: Self-pay | Admitting: Oncology

## 2021-01-11 ENCOUNTER — Encounter: Payer: Self-pay | Admitting: Physician Assistant

## 2021-01-11 ENCOUNTER — Ambulatory Visit
Admission: RE | Admit: 2021-01-11 | Discharge: 2021-01-11 | Disposition: A | Discharge status: Home / Self Care | Payer: Medicare Other | Source: Ambulatory Visit | Attending: Physician Assistant | Admitting: Physician Assistant

## 2021-01-11 ENCOUNTER — Ambulatory Visit
Admission: RE | Admit: 2021-01-11 | Discharge: 2021-01-11 | Disposition: A | Discharge status: Home / Self Care | Payer: Medicare Other | Attending: Physician Assistant | Admitting: Physician Assistant

## 2021-01-11 ENCOUNTER — Ambulatory Visit (INDEPENDENT_AMBULATORY_CARE_PROVIDER_SITE_OTHER): Payer: Medicare Other | Admitting: Physician Assistant

## 2021-01-11 DIAGNOSIS — R2242 Localized swelling, mass and lump, left lower limb: Secondary | ICD-10-CM

## 2021-01-11 DIAGNOSIS — R55 Syncope and collapse: Secondary | ICD-10-CM | POA: Diagnosis not present

## 2021-01-11 DIAGNOSIS — I10 Essential (primary) hypertension: Secondary | ICD-10-CM

## 2021-01-11 DIAGNOSIS — M7989 Other specified soft tissue disorders: Secondary | ICD-10-CM | POA: Diagnosis not present

## 2021-01-11 DIAGNOSIS — M7732 Calcaneal spur, left foot: Secondary | ICD-10-CM | POA: Diagnosis not present

## 2021-01-11 DIAGNOSIS — R600 Localized edema: Secondary | ICD-10-CM | POA: Diagnosis not present

## 2021-01-11 NOTE — Progress Notes (Signed)
[facility] [place], [place] [address]  Internal MEDICINE  Office Visit Note  Patient Name: Dawn Alvarez  [id number]  [id number]  Date of Service: 01/11/2021  Chief Complaint  Patient presents with   left foot swollen    Monday woke up looked swollen, no injury, has been soaking in epson salt   fainted    Fainted on Tuesday at [place] and then again later that day at home, pt didn't have any symptoms before fainting     HPI Pt is here for a sick visit. -Her left foot was swollen on Monday AM and has not changed in size since then. She states it is tender along the top of her foot where swelling is localized and feels a little numb. No swelling through ankle or leg. Is able to walk on it ok. Discussed possible etiologies and will start with obtaining an xray of her foot--if this is normal or if any new or worsening symptoms may need to order [place] for further investigation. Pulses palpated on exam and based on localization and tenderness over bones, a blood clot is less likely but sill of some concern and patient is aware and will go to ED if any worsening or changes -Did have a fainting spell on Tuesday. She was cooking and got warm. States she was controlled to get down to the ground and did not hit her head and has felt fine since then. Denies any dizziness, heart racing, CP, or SOB.  -Discussed that she should call if any recurrences. Advised to stay well hydrated and make sure she is eating at regular intervals.  Current Medication:  Outpatient Encounter Medications as of 01/11/2021  Medication Sig Note   Alcohol Swabs (B-D SINGLE USE SWABS REGULAR) PADS Use as directed twice a day    amLODipine (NORVASC) 10 MG tablet Take 1/2 tablet po QD    atorvastatin (LIPITOR) 20 MG tablet Take 1 tablet (20 mg total) by mouth daily.    Blood Glucose Monitoring Suppl (TRUE METRIX AIR GLUCOSE METER) w/Device KIT 1 Device by Does not apply route in the morning and at  bedtime. Use ad directed e11.65    brimonidine (ALPHAGAN) 0.2 % ophthalmic solution Place 1 drop into both eyes 2 (two) times daily.    Calcium Carb-Cholecalciferol (CALCIUM 600 + D) 600-200 MG-UNIT TABS Take 1 tablet by mouth daily.    gabapentin (NEURONTIN) 100 MG capsule Take 2 capsules (200 mg total) by mouth 3 (three) times daily.    glucose blood (TRUE METRIX BLOOD GLUCOSE TEST) test strip Use as instructed twice a daily diag e11.65    HYDROcodone-acetaminophen (NORCO/VICODIN) 5-325 MG tablet Take 1-2 tablets by mouth every 6 (six) hours as needed for moderate pain or severe pain.    insulin detemir (LEVEMIR FLEXTOUCH) 100 UNIT/ML FlexPen Inject 44 Units into the skin daily. ALONG WITH NEEDLES 10/24/2020: Patient taking 30 units    Insulin Pen Needle (PEN NEEDLES) 31G X 8 MM MISC Use as directed with insulin E11.65    Lancets Misc. (ACCU-CHEK MULTICLIX LANCET DEV) KIT by Does not apply route. Use as directed twice a day diag E11.65    lidocaine-prilocaine (EMLA) cream SMARTSIG:1 Topical Every Night    lisinopril (ZESTRIL) 30 MG tablet Take one tab po qd for HTN    Omega-3 Fatty Acids (FISH OIL) 1000 MG CAPS Take 1,000 mg by mouth daily.     Semaglutide, 1 MG/DOSE, (OZEMPIC, 1 MG/DOSE,) 2 MG/1.5ML SOPN Inject 1 mg  into the skin once a week. FOR DM    SIMBRINZA 1-0.2 % SUSP     TRUEplus Lancets 30G MISC Use as directed twice daily diag e11.65    Facility-Administered Encounter Medications as of 01/11/2021  Medication   sodium chloride flush (NS) 0.9 % injection 10 mL      Medical History: Past Medical History:  Diagnosis Date   Asthma    Cancer ([place]) 03/2020   right breast IMC   COPD exacerbation ([place]) 04/12/2016   Diabetes mellitus without complication (HCC)    Hyperlipemia    Hypertension    Personal history of chemotherapy 2021     Vital Signs: BP 122/70   Pulse 74   Temp 97.9 F (36.6 C)   Resp 16   Ht '5\' 1"'  (1.549 m)   Wt 158 lb 3.2 oz (71.8 kg)   SpO2 98%   BMI  29.89 kg/m    Review of Systems  Constitutional:  Negative for fatigue and fever.  HENT:  Negative for congestion, mouth sores and postnasal drip.   Respiratory:  Negative for cough, chest tightness and shortness of breath.   Cardiovascular:  Negative for chest pain and palpitations.  Genitourinary:  Negative for flank pain.  Musculoskeletal:  Positive for joint swelling.       Left foot pain and swelling along the top of foot with a little numbness near point of tenderness  Neurological:  Positive for syncope and numbness. Negative for dizziness, light-headedness and headaches.       Fainting spell on Tuesday, did not hit head  Psychiatric/Behavioral: Negative.     Physical Exam Vitals and nursing note reviewed.  Constitutional:      General: She is not in acute distress.    Appearance: She is well-developed. She is not diaphoretic.  HENT:     Head: Normocephalic and atraumatic.     Mouth/Throat:     Pharynx: No oropharyngeal exudate.  Eyes:     Pupils: Pupils are equal, round, and reactive to light.  Neck:     Thyroid: No thyromegaly.     Vascular: No JVD.     Trachea: No tracheal deviation.  Cardiovascular:     Rate and Rhythm: Normal rate and regular rhythm.     Heart sounds: Normal heart sounds. No murmur heard.   No friction rub. No gallop.  Pulmonary:     Effort: Pulmonary effort is normal. No respiratory distress.     Breath sounds: No wheezing or rales.  Chest:     Chest wall: No tenderness.  Abdominal:     General: Bowel sounds are normal.     Palpations: Abdomen is soft.  Musculoskeletal:        General: Swelling and tenderness present. Normal range of motion.     Cervical back: Normal range of motion and neck supple.     Comments: Localized swelling along the top of left foot with tenderness over metatarsals and some reports numbness. No swelling or pain anywhere else through ankle or legg and strong pulses palpated. No color or temp changes   Lymphadenopathy:     Cervical: No cervical adenopathy.  Skin:    General: Skin is warm and dry.  Neurological:     Mental Status: She is alert and oriented to person, place, and time.     Cranial Nerves: No cranial nerve deficit.  Psychiatric:        Behavior: Behavior normal.        Thought Content:  Thought content normal.        Judgment: Judgment normal.      Assessment/Plan: 1. Localized swelling of left foot Based on swelling location and tenderness along bone with order xray of foot for further investigation. Patient educated to go to ED if any new or worsening symptoms and may need to consider [place] if xray does not reveal anything. - DG Foot Complete Left; Future  2. Fainting spell Educated to stay well hydrated and eat at regular intervals and to notify office if any recurrence or go to ED if new or worsening symptoms.  3. Essential hypertension Stable, continue current medications    General Counseling: Yanice verbalizes understanding of the findings of todays visit and agrees with plan of treatment. I have discussed any further diagnostic evaluation that may be needed or ordered today. We also reviewed her medications today. she has been encouraged to call the office with any questions or concerns that should arise related to todays visit.    Counseling:    Orders Placed This Encounter  Procedures   DG Foot Complete Left    No orders of the defined types were placed in this encounter.   Time spent:30 Minutes

## 2021-01-14 ENCOUNTER — Encounter: Payer: Self-pay | Admitting: Nurse Practitioner

## 2021-01-15 ENCOUNTER — Encounter: Payer: Self-pay | Admitting: Internal Medicine

## 2021-01-15 ENCOUNTER — Other Ambulatory Visit: Payer: Self-pay

## 2021-01-15 ENCOUNTER — Ambulatory Visit (INDEPENDENT_AMBULATORY_CARE_PROVIDER_SITE_OTHER): Payer: Medicare Other | Admitting: Internal Medicine

## 2021-01-15 VITALS — BP 132/84 | HR 90 | Resp 16 | Ht 60.0 in | Wt 157.8 lb

## 2021-01-15 DIAGNOSIS — R2242 Localized swelling, mass and lump, left lower limb: Secondary | ICD-10-CM | POA: Diagnosis not present

## 2021-01-15 DIAGNOSIS — E041 Nontoxic single thyroid nodule: Secondary | ICD-10-CM

## 2021-01-15 DIAGNOSIS — Z23 Encounter for immunization: Secondary | ICD-10-CM | POA: Diagnosis not present

## 2021-01-15 DIAGNOSIS — Z794 Long term (current) use of insulin: Secondary | ICD-10-CM | POA: Diagnosis not present

## 2021-01-15 DIAGNOSIS — E1140 Type 2 diabetes mellitus with diabetic neuropathy, unspecified: Secondary | ICD-10-CM

## 2021-01-15 DIAGNOSIS — I739 Peripheral vascular disease, unspecified: Secondary | ICD-10-CM | POA: Diagnosis not present

## 2021-01-15 MED ORDER — GABAPENTIN 300 MG PO CAPS
300.0000 mg | ORAL_CAPSULE | Freq: Three times a day (TID) | ORAL | 3 refills | Status: DC
Start: 2021-01-15 — End: 2022-02-24

## 2021-01-15 MED ORDER — CELECOXIB 200 MG PO CAPS: ORAL_CAPSULE | ORAL | 1 refills | Status: DC

## 2021-01-15 MED ORDER — TETANUS-DIPHTH-ACELL PERTUSSIS 5-2.5-18.5 LF-MCG/0.5 IM SUSP
0.5000 mL | Freq: Once | INTRAMUSCULAR | 0 refills | Status: AC
Start: 2021-01-15 — End: 2021-01-15

## 2021-01-15 NOTE — Progress Notes (Signed)
[facility] [place], [place] [address]  Internal MEDICINE  Office Visit Note  Patient Name: Dawn Alvarez  [id number]  [id number]  Date of Service: 01/15/2021  Chief Complaint  Patient presents with   Foot Swelling    fup   Results    Xrays of left foot    HPI  Pt is seen for acute visit for left foot pain and swelling. Xray results are abnormal with following finding There is mild midfoot spurring. Small osseous density adjacent to the anterior dorsal talus may represent sequela of remote injury or be a capsular calcification. No acute fracture, erosion, or periosteal reaction. Small plantar calcaneal spur. Moderate soft tissue edema over the dorsum of the foot. There are arterial vascular calcifications. C/o numbness and pain in both lower ext  breast cancer s/p chemo and radiation treatment for cT2N0 grade 3 invasive mammary  carcinoma.  ER negative, PR weakly positive (<=10%), HER-2, Had right breast lumpectomy   Current Medication: Outpatient Encounter Medications as of 01/15/2021  Medication Sig Note   Alcohol Swabs (B-D SINGLE USE SWABS REGULAR) PADS Use as directed twice a day    amLODipine (NORVASC) 10 MG tablet Take 1/2 tablet po QD    atorvastatin (LIPITOR) 20 MG tablet Take 1 tablet (20 mg total) by mouth daily.    Blood Glucose Monitoring Suppl (TRUE METRIX AIR GLUCOSE METER) w/Device KIT 1 Device by Does not apply route in the morning and at bedtime. Use ad directed e11.65    brimonidine (ALPHAGAN) 0.2 % ophthalmic solution Place 1 drop into both eyes 2 (two) times daily.    Calcium Carb-Cholecalciferol (CALCIUM 600 + D) 600-200 MG-UNIT TABS Take 1 tablet by mouth daily.    gabapentin (NEURONTIN) 100 MG capsule Take 2 capsules (200 mg total) by mouth 3 (three) times daily.    glucose blood (TRUE METRIX BLOOD GLUCOSE TEST) test strip Use as instructed twice a daily diag e11.65    HYDROcodone-acetaminophen (NORCO/VICODIN) 5-325 MG  tablet Take 1-2 tablets by mouth every 6 (six) hours as needed for moderate pain or severe pain.    insulin detemir (LEVEMIR FLEXTOUCH) 100 UNIT/ML FlexPen Inject 44 Units into the skin daily. ALONG WITH NEEDLES 10/24/2020: Patient taking 30 units    Insulin Pen Needle (PEN NEEDLES) 31G X 8 MM MISC Use as directed with insulin E11.65    Lancets Misc. (ACCU-CHEK MULTICLIX LANCET DEV) KIT by Does not apply route. Use as directed twice a day diag E11.65    lidocaine-prilocaine (EMLA) cream SMARTSIG:1 Topical Every Night    lisinopril (ZESTRIL) 30 MG tablet Take one tab po qd for HTN    Omega-3 Fatty Acids (FISH OIL) 1000 MG CAPS Take 1,000 mg by mouth daily.     Semaglutide, 1 MG/DOSE, (OZEMPIC, 1 MG/DOSE,) 2 MG/1.5ML SOPN Inject 1 mg into the skin once a week. FOR DM    SIMBRINZA 1-0.2 % SUSP     Tdap (BOOSTRIX) 5-2.5-18.5 LF-MCG/0.5 injection Inject 0.5 mLs into the muscle once for 1 dose.    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   BREAST LUMPECTOMY WITH RADIOACTIVE SEED AND SENTINEL LYMPH NODE BIOPSY Right 05/04/2020   Procedure: RIGHT BREAST LUMPECTOMY WITH BRACKETED RADIOACTIVE SEED AND SENTINEL LYMPH NODE BIOPSY;  Surgeon: Jovita Kussmaul, MD;  Location: [place];  Service: General;  Laterality: Right;   CESAREAN SECTION     COLONOSCOPY WITH PROPOFOL N/A 09/11/2015   Procedure: COLONOSCOPY WITH PROPOFOL;  Surgeon:  Lucilla Lame, MD;  Location: [facility] ENDOSCOPY;  Service: Endoscopy;  Laterality: N/A;   IR IMAGING GUIDED PORT INSERTION  07/20/2019    Medical History: Past Medical History:  Diagnosis Date   Asthma    Cancer ([place]) 03/2020   right breast IMC   COPD exacerbation ([place]) 04/12/2016   Diabetes mellitus without complication (HCC)    Hyperlipemia    Hypertension    Personal history of chemotherapy 2021    Family History: Family History  Problem Relation Age of Onset   Diabetes Mother    Hypertension Mother    Hypertension Father    Diabetes Father    Breast cancer Neg Hx     Social History   Socioeconomic History   Marital status: Married    Spouse name: Not on file   Number of children: Not on file   Years of education: Not on file   Highest education level: Not on file  Occupational History   Not on file  Tobacco Use   Smoking status: Former    Packs/day: 1.00    Years: 2.00    Pack years: 2.00    Types: Cigarettes    Quit date: 02/29/2020    Years since quitting: 0.8   Smokeless tobacco: Never  Substance and Sexual Activity   Alcohol use: Yes    Comment: social   Drug use: No   Sexual activity: Not Currently    Birth control/protection: Post-menopausal  Other Topics Concern   Not on file  Social History Narrative   Not on file   Social Determinants of Health   Financial Resource Strain: Low Risk    Difficulty of Paying Living Expenses: Not very hard  Food Insecurity: Not on file  Transportation Needs: Not on file  Physical Activity: Not on file  Stress: Not on file  Social Connections: Not on file  Intimate Partner Violence: Not on file      Review of Systems  Constitutional:  Negative for chills, fatigue and unexpected weight change.  HENT:  Positive for postnasal drip. Negative for congestion, rhinorrhea, sneezing and sore throat.   Eyes:  Negative for redness.  Respiratory:  Negative for cough, chest tightness and shortness of breath.    Cardiovascular:  Negative for chest pain and palpitations.  Gastrointestinal:  Negative for abdominal pain, constipation, diarrhea, nausea and vomiting.  Genitourinary:  Negative for dysuria and frequency.  Musculoskeletal:  Positive for arthralgias. Negative for back pain, joint swelling and neck pain.  Skin:  Negative for rash.  Neurological: Negative.  Negative for tremors and numbness.  Hematological:  Negative for adenopathy. Does not bruise/bleed easily.  Psychiatric/Behavioral:  Negative for behavioral problems (Depression), sleep disturbance and suicidal ideas. The patient is not nervous/anxious.    Vital Signs: BP 132/84   Pulse 90   Resp 16   Ht 5' (1.524 m)   Wt 157 lb 12.8 oz (71.6 kg)   SpO2  98%   BMI 30.82 kg/m    Physical Exam Constitutional:      Appearance: Normal appearance.  HENT:     Head: Normocephalic and atraumatic.     Nose: Nose normal.     Mouth/Throat:     Mouth: Mucous membranes are moist.     Pharynx: No posterior oropharyngeal erythema.  Eyes:     Extraocular Movements: Extraocular movements intact.     Pupils: Pupils are equal, round, and reactive to light.  Cardiovascular:     Heart sounds: Normal heart sounds.  Pulmonary:     Effort: Pulmonary effort is normal.     Breath sounds: Normal breath sounds.  Musculoskeletal:        General: Swelling and deformity present.     Right lower leg: Edema present.  Skin:    General: Skin is warm.     Capillary Refill: Capillary refill takes more than 3 seconds. Decreased pp  Neurological:     General: No focal deficit present.     Mental Status: She is alert.  Psychiatric:        Mood and Affect: Mood normal.        Behavior: Behavior normal.    Assessment/Plan: 1. Localized swelling of left foot Tender to touch, STS, will add celebrex for now, look into gout, might need allopurinol  - celecoxib (CELEBREX) 200 MG capsule; Take one tab po qd for one week and then as needed  Dispense: 30  capsule; Refill: 1 - Uric acid - Sed Rate (ESR)  2. Need for Tdap vaccination - Tdap ([place]) 5-2.5-18.5 LF-MCG/0.5 injection; Inject 0.5 mLs into the muscle once for 1 dose.  Dispense: 0.5 mL; Refill: 0  3. Need for shingles vaccine - Varicella-zoster vaccine IM (Shingrix)  4. Intermittent claudication (HCC) ABI is abnormal, will add arterial study for better assessment  - US ARTERIAL LOWER EXTREMITY DUPLEX BILATERAL; Future  5. Thyroid nodule Pt was suppose to have thyroid biopsy during COVID pandemic but she never did, will update  - US THYROID; Future  6. Type 2 diabetes mellitus with diabetic neuropathy, with long-term current use of insulin (HCC) Pt is only on long acting insuline. Will add gabapentin  - gabapentin (NEURONTIN) 300 MG capsule; Take 1 capsule (300 mg total) by mouth 3 (three) times daily.  Dispense: 90 capsule; Refill: 3   General Counseling: Ellah verbalizes understanding of the findings of todays visit and agrees with plan of treatment. I have discussed any further diagnostic evaluation that may be needed or ordered today. We also reviewed her medications today. she has been encouraged to call the office with any questions or concerns that should arise related to todays visit.    Orders Placed This Encounter  Procedures   Varicella-zoster vaccine IM (Shingrix)    Meds ordered this encounter  Medications   Tdap (BOOSTRIX) 5-2.5-18.5 LF-MCG/0.5 injection    Sig: Inject 0.5 mLs into the muscle once for 1 dose.    Dispense:  0.5 mL    Refill:  0    Total time spent:30 Minutes Time spent includes review of chart, medications, test results, and follow up plan with the patient.   [place] Controlled Substance Database was reviewed by me.   Dr Lavera Guise Internal medicine

## 2021-01-16 LAB — URIC ACID: Uric Acid: 8.5 mg/dL — ABNORMAL HIGH (ref 3.0–7.2)

## 2021-01-16 LAB — SEDIMENTATION RATE: Sed Rate: 10 mm/hr (ref 0–40)

## 2021-01-17 ENCOUNTER — Other Ambulatory Visit: Payer: Self-pay

## 2021-01-17 ENCOUNTER — Telehealth: Payer: Self-pay

## 2021-01-17 MED ORDER — ALLOPURINOL 100 MG PO TABS: 100.0000 mg | ORAL_TABLET | Freq: Every evening | ORAL | 3 refills | Status: DC

## 2021-01-17 NOTE — Telephone Encounter (Signed)
Pt informed that she has Gout and that we sent allopurinol to her pharmacy [facility] w/3 refills

## 2021-01-17 NOTE — Progress Notes (Signed)
LMOM on daughters phone for pt to return my call about her labs

## 2021-01-17 NOTE — Telephone Encounter (Signed)
-----   Message from Lavera Guise, MD sent at 01/17/2021 10:00 AM EDT ----- Please notify pt she has gout and will add Allopurinol 100 mg at night # 90 with 3 refills

## 2021-01-21 DIAGNOSIS — H40003 Preglaucoma, unspecified, bilateral: Secondary | ICD-10-CM | POA: Diagnosis not present

## 2021-01-23 ENCOUNTER — Other Ambulatory Visit: Payer: Medicare Other

## 2021-01-23 DIAGNOSIS — Z0289 Encounter for other administrative examinations: Secondary | ICD-10-CM

## 2021-01-29 ENCOUNTER — Telehealth: Payer: Self-pay

## 2021-01-29 NOTE — Telephone Encounter (Signed)
Pt's child left a VM on the after hours phone during lunch for [place] to return her call.  I tried to call pt back at the [phone] they left and I get message that vm isn't set up yet

## 2021-01-30 ENCOUNTER — Ambulatory Visit (INDEPENDENT_AMBULATORY_CARE_PROVIDER_SITE_OTHER): Payer: Medicare Other

## 2021-01-30 ENCOUNTER — Other Ambulatory Visit: Payer: Self-pay

## 2021-01-30 DIAGNOSIS — I739 Peripheral vascular disease, unspecified: Secondary | ICD-10-CM

## 2021-02-11 ENCOUNTER — Inpatient Hospital Stay: Payer: Medicare Other | Attending: Oncology

## 2021-02-11 DIAGNOSIS — C50411 Malignant neoplasm of upper-outer quadrant of right female breast: Secondary | ICD-10-CM | POA: Insufficient documentation

## 2021-02-11 DIAGNOSIS — Z171 Estrogen receptor negative status [ER-]: Secondary | ICD-10-CM | POA: Diagnosis not present

## 2021-02-11 DIAGNOSIS — Z452 Encounter for adjustment and management of vascular access device: Secondary | ICD-10-CM | POA: Diagnosis not present

## 2021-02-11 MED ORDER — SODIUM CHLORIDE 0.9% FLUSH
10.0000 mL | Freq: Once | INTRAVENOUS | Status: AC
Start: 2021-02-11 — End: 2021-02-11
  Administered 2021-02-11: 10 mL via INTRAVENOUS
  Filled 2021-02-11: qty 10

## 2021-02-11 MED ORDER — HEPARIN SOD (PORK) LOCK FLUSH 100 UNIT/ML IV SOLN
500.0000 [IU] | Freq: Once | INTRAVENOUS | Status: AC
Start: 2021-02-11 — End: 2021-02-11
  Administered 2021-02-11: 500 [IU] via INTRAVENOUS
  Filled 2021-02-11: qty 5

## 2021-02-11 MED ORDER — HEPARIN SOD (PORK) LOCK FLUSH 100 UNIT/ML IV SOLN
INTRAVENOUS | Status: AC
  Filled 2021-02-11: qty 5

## 2021-02-20 ENCOUNTER — Telehealth: Payer: Self-pay

## 2021-02-20 NOTE — Telephone Encounter (Signed)
Left vm w/ LaTonya, daughter, to let her know 02/26/21 appointment has been moved to 03/04/21 @ 3:40-Toni

## 2021-02-21 ENCOUNTER — Other Ambulatory Visit: Payer: Self-pay

## 2021-02-21 DIAGNOSIS — Z171 Estrogen receptor negative status [ER-]: Secondary | ICD-10-CM

## 2021-02-21 DIAGNOSIS — C50411 Malignant neoplasm of upper-outer quadrant of right female breast: Secondary | ICD-10-CM

## 2021-02-22 ENCOUNTER — Inpatient Hospital Stay: Payer: Medicare Other

## 2021-02-22 ENCOUNTER — Inpatient Hospital Stay: Payer: Medicaid Other

## 2021-02-22 ENCOUNTER — Inpatient Hospital Stay: Payer: Medicare Other | Admitting: Oncology

## 2021-02-25 ENCOUNTER — Other Ambulatory Visit: Payer: Self-pay | Admitting: Nurse Practitioner

## 2021-02-25 DIAGNOSIS — I10 Essential (primary) hypertension: Secondary | ICD-10-CM

## 2021-02-25 NOTE — Progress Notes (Signed)
Chronic Care Management Pharmacy Note  02/27/2021 Name:  Dawn Alvarez MRN:  [id number] DOB:  03/16/1956  Subjective: Dawn Alvarez is an 65 y.o. year old female who is a primary patient of Dawn Rolls Timoteo Gaul, MD.  The CCM team was consulted for assistance with disease management and care coordination needs.    Engaged with patient by telephone for follow up visit in response to provider referral for pharmacy case management and/or care coordination services.   Consent to Services:  The patient was given the following information about Chronic Care Management services today, agreed to services, and gave verbal consent: 1. CCM service includes personalized support from designated clinical staff supervised by the primary care provider, including individualized plan of care and coordination with other care providers 2. 24/7 contact phone numbers for assistance for urgent and routine care needs. 3. Service will only be billed when office clinical staff spend 20 minutes or more in a month to coordinate care. 4. Only one practitioner may furnish and bill the service in a calendar month. 5.The patient may stop CCM services at any time (effective at the end of the month) by phone call to the office staff. 6. The patient will be responsible for cost sharing (co-pay) of up to 20% of the service fee (after annual deductible is met). Patient agreed to services and consent obtained.  Patient Care Team: Lavera Guise, MD as PCP - General (Internal Medicine) Kate Sable, MD as PCP - Cardiology (Cardiology) Rico Junker, RN as Registered Nurse Theodore Demark, RN as Registered Nurse Earlie Server, MD as Consulting Physician (Oncology) Noreene Filbert, MD as Referring Physician (Radiation Oncology) Jovita Kussmaul, MD as Consulting Physician (General Surgery) Earlie Server, MD as Consulting Physician (Oncology) Rico Junker, RN as Registered Nurse Edythe Clarity, [facility] as Pharmacist  (Pharmacist)  Recent office visits: None since last 6 months   Recent consult visits: 08/22/20 Oncology Earlie Server, MD. For follow-Up. CHANGED/INCREASED Gabapentin to 100 mg 3 times daily.  07/11/20 Ophthalmoglogy Dingeldein, Remo Lipps No information given.  05/18/20 Oncology Earlie Server, MD. For follow-Up. No medication changes.  Hospital visits: 05/04/20 Jovita Kussmaul, [place] (5 Hours) For right breast lumpectomy with bracketed radioactive seed and sentinel lymph node biopsy   Medication History: Atorvastatin 20 mg 90 DS 07/30/20 Lisinopril 30 mg 90 DS 09/27/20 Gabapentin 100 mg 90 DS 08/22/20  Objective:  Lab Results  Component Value Date   CREATININE 0.84 10/22/2020   BUN 20 10/22/2020   GFRNONAA >60 10/22/2020   GFRAA >60 01/02/2020   NA 137 10/22/2020   K 3.8 10/22/2020   CALCIUM 9.1 10/22/2020   CO2 23 10/22/2020   GLUCOSE 151 (H) 10/22/2020    Lab Results  Component Value Date/Time   HGBA1C 6.0 (A) 01/01/2021 10:14 AM   HGBA1C 6.6 (A) 10/01/2020 10:54 AM   HGBA1C 10.6 (H) 09/03/2019 06:25 PM   HGBA1C 11.2 (H) 07/27/2011 09:36 AM    Last diabetic Eye exam: No results found for: HMDIABEYEEXA  Last diabetic Foot exam: No results found for: HMDIABFOOTEX   Lab Results  Component Value Date   CHOL 239 (H) 10/04/2020   HDL 58 10/04/2020   LDLCALC 143 (H) 10/04/2020   TRIG 210 (H) 10/04/2020   CHOLHDL 5.8 (H) 10/05/2017    Hepatic Function Latest Ref Rng & Units 10/22/2020 10/04/2020 03/22/2020  Total Protein 6.5 - 8.1 g/dL 7.3 6.9 7.2  Albumin 3.5 - 5.0 g/dL 3.8 4.4  3.8  AST 15 - 41 U/L _0 ALT 0 - 44 U/L _1 Alk Phosphatase 38 - 126 U/L 35(L) 40(L) 28(L)  Total Bilirubin 0.3 - 1.2 mg/dL 0.6 0.3 0.5  Bilirubin, Direct 0.1 - 0.5 mg/dL - - -    Lab Results  Component Value Date/Time   TSH 2.460 10/04/2020 08:51 AM   TSH 0.908 09/03/2019 06:25 PM   TSH 1.680 10/05/2017 09:38 AM   FREET4 1.35 10/04/2020 08:51 AM   FREET4 1.36  10/06/2018 11:21 AM    CBC Latest Ref Rng & Units 10/22/2020 10/04/2020 08/09/2020  WBC 4.0 - 10.5 K/uL 5.6 6.2 5.6  Hemoglobin 12.0 - 15.0 g/dL 11.6(L) 11.8 11.3(L)  Hematocrit 36.0 - 46.0 % 34.2(L) 35.9 33.8(L)  Platelets 150 - 400 K/uL 252 224 204    Lab Results  Component Value Date/Time   VD25OH 18.1 (L) 10/06/2018 11:21 AM    Clinical ASCVD: No  The ASCVD Risk score (Arnett DK, et al., 2019) failed to calculate for the following reasons:   The patient has a prior MI or stroke diagnosis    Depression screen [facility] 2/9 01/15/2021 01/01/2021 10/01/2020  Decreased Interest 0 0 0  Down, Depressed, Hopeless 0 0 0  PHQ - 2 Score 0 0 0  Some recent data might be hidden     Social History   Tobacco Use  Smoking Status Former   Packs/day: 1.00   Years: 2.00   Pack years: 2.00   Types: Cigarettes   Quit date: 02/29/2020   Years since quitting: 0.9  Smokeless Tobacco Never   BP Readings from Last 3 Encounters:  01/15/21 132/84  01/11/21 122/70  01/01/21 140/88   Pulse Readings from Last 3 Encounters:  01/15/21 90  01/11/21 74  01/01/21 87   Wt Readings from Last 3 Encounters:  01/15/21 157 lb 12.8 oz (71.6 kg)  01/11/21 158 lb 3.2 oz (71.8 kg)  01/01/21 161 lb 12.8 oz (73.4 kg)   BMI Readings from Last 3 Encounters:  01/15/21 30.82 kg/m  01/11/21 29.89 kg/m  01/01/21 30.57 kg/m    Assessment/Interventions: Review of patient past medical history, allergies, medications, health status, including review of consultants reports, laboratory and other test data, was performed as part of comprehensive evaluation and provision of chronic care management services.   SDOH:  (Social Determinants of Health) assessments and interventions performed: Yes   Financial Resource Strain: Low Risk    Difficulty of Paying Living Expenses: Not very hard    SDOH Screenings   Alcohol Screen: Low Risk    Last Alcohol Screening Score (AUDIT): 1  Depression (PHQ2-9): Low Risk    PHQ-2  Score: 0  Financial Resource Strain: Low Risk    Difficulty of Paying Living Expenses: Not very hard  Food Insecurity: Not on file  Housing: Not on file  Physical Activity: Not on file  Social Connections: Not on file  Stress: Not on file  Tobacco Use: Medium Risk   Smoking Tobacco Use: Former   Smokeless Tobacco Use: Never  Transportation Needs: Not on file    [place]  No Known Allergies  Medications Reviewed Today     Reviewed by Edythe Clarity, [place] (Pharmacist) on 02/27/21 at [place] List Status: <None>   Medication Order Taking? Sig Documenting Provider Last Dose Status Informant  Alcohol Swabs (B-D SINGLE USE SWABS REGULAR) PADS [id number] Yes Use as directed twice a day Lavera Guise, MD Taking  Active   allopurinol (ZYLOPRIM) 100 MG tablet [id number] Yes Take 1 tablet (100 mg total) by mouth at bedtime. Take 1 tablet by mouth at night for Gout Lavera Guise, MD Taking Active   amLODipine (NORVASC) 10 MG tablet [id number] Yes Take 1/2 tablet po QD [name], NP Taking Active   atorvastatin (LIPITOR) 20 MG tablet [id number] Yes Take 1 tablet (20 mg total) by mouth daily. [name], NP Taking Active   Blood Glucose Monitoring Suppl (TRUE METRIX AIR GLUCOSE METER) w/Device KIT [id number] Yes 1 Device by Does not apply route in the morning and at bedtime. Use ad directed e11.65 Lavera Guise, MD Taking Active   brimonidine [facility]) 0.2 % ophthalmic solution [id number] Yes Place 1 drop into both eyes 2 (two) times daily. [name], NP Taking Active   Calcium Carb-Cholecalciferol (CALCIUM 600 + D) 600-200 MG-UNIT TABS [id number] Yes Take 1 tablet by mouth daily. [provider] Taking Active   celecoxib (CELEBREX) 200 MG capsule [id number] Yes Take one tab po qd for one week and then as needed Lavera Guise, MD Taking Active   gabapentin (NEURONTIN) 300 MG capsule [id number] Yes Take 1 capsule (300 mg total) by mouth 3 (three) times daily. Lavera Guise, MD Taking Active   glucose blood (TRUE METRIX BLOOD GLUCOSE TEST) test strip [id number] Yes Use as instructed twice a daily diag e11.65 Lavera Guise, MD Taking Active   HYDROcodone-acetaminophen (NORCO/VICODIN) 5-325 MG tablet [id number] Yes Take 1-2 tablets by mouth every 6 (six) hours as needed for moderate pain or severe pain. Autumn Messing III, MD Taking Active   insulin detemir (LEVEMIR FLEXTOUCH) 100 UNIT/ML FlexPen [id number] Yes Inject 44 Units into the skin daily. ALONG WITH NEEDLES Lavera Guise, MD Taking Active            Med Note ([place]   Wed Oct 24, 2020  9:48 AM) Patient taking 30 units   Insulin Pen Needle (PEN NEEDLES) 31G X 8 MM MISC [id number] Yes Use as directed with insulin E11.65 [name], NP Taking Active   Lancets Misc. (ACCU-CHEK MULTICLIX LANCET DEV) KIT [id number] Yes by Does not apply route. Use as directed twice a day diag E11.65 [provider] Taking Active Self  lidocaine-prilocaine (EMLA) cream [id number] Yes SMARTSIG:1 Topical Every Night [provider] Taking Active   lisinopril (ZESTRIL) 30 MG tablet [id number] Yes Take one tab po qd for HTN Luiz Ochoa, NP Taking Active   Omega-3 Fatty Acids (FISH OIL) 1000 MG CAPS [id number] Yes Take 1,000 mg by mouth daily.  [provider] Taking Active Self  Semaglutide, 1 MG/DOSE, (OZEMPIC, 1 MG/DOSE,) 2 MG/1.5ML SOPN [id number] Yes Inject 1 mg into the skin once a week. FOR DM [name], NP Taking Active   SIMBRINZA 1-0.2 % SUSP [id number] Yes  [provider] Taking Active Self  TRUEplus Lancets 30G [place] [id number] Yes Use as directed twice daily diag e11.65 Lavera Guise, MD Taking Active             Patient Active Problem List   Diagnosis Date Noted   Neuropathy 03/22/2020   Port-A-Cath in place 03/22/2020   Antineoplastic chemotherapy induced anemia 11/17/2019   Encounter for antineoplastic chemotherapy 11/17/2019   Genetic testing 11/14/2019    Sinus bradycardia 09/03/2019   Hypokalemia 09/03/2019   Dehydration 08/04/2019   Goals of care, counseling/discussion 07/27/2019   Malignant neoplasm of upper-outer quadrant of right breast in female, estrogen  receptor negative ([place]) 07/27/2019   Malignant neoplasm of upper-outer quadrant of right female breast ([place]) 07/08/2019   Mass of soft tissue of chest 06/18/2019   Mass of axilla, left 06/18/2019   Encounter for screening mammogram for malignant neoplasm of breast 06/18/2019   Visual changes 06/18/2019   Goiter diffuse, nontoxic 07/21/2018   Acute upper respiratory infection 04/15/2018   Mixed hyperlipidemia 01/10/2018   Dysuria 10/11/2017   Headache, acute 06/24/2017   Type 2 diabetes mellitus with hyperglycemia, with long-term current use of insulin ([place]) 06/23/2017   Essential hypertension 06/23/2017   Syncope 04/12/2016   Dizziness and giddiness 04/12/2016   Acute bronchitis 04/12/2016   COPD exacerbation ([place]) 04/12/2016   Tobacco abuse counseling 04/12/2016   Atypical chest pain 04/11/2016   Encounter for general adult medical examination with abnormal findings    Benign neoplasm of sigmoid colon     Immunization History  Administered Date(s) Administered   Influenza Inj Mdck Quad Pf 06/23/2017, 04/03/2020   Influenza, Quadrivalent, Recombinant, Inj, Pf 03/10/2019   Influenza,inj,Quad PF,6+ Mos 04/12/2016   Pneumococcal Polysaccharide-23 04/12/2016, 03/10/2019    Conditions to be addressed/monitored:  HTN, Type II DM w/ Neuropathy, HLD,   Care Plan : General Pharmacy (Adult)  Updates made by Edythe Clarity, RPH since 02/27/2021 12:00 AM     Problem: HTN, HLD, DM   Priority: High  Onset Date: 10/24/2020     Long-Range Goal: Patient-Specific Goal   Start Date: 10/24/2020  Expected End Date: 04/26/2021  Recent Progress: On track  Priority: High  Note:   Current Barriers:  Unable to achieve control of cholesterol   Pharmacist Clinical Goal(s):   Patient will achieve control of cholesterol as evidenced by follow up labs through collaboration with PharmD and provider.   Interventions: 1:1 collaboration with Lavera Guise, MD regarding development and update of comprehensive plan of care as evidenced by provider attestation and co-signature Inter-disciplinary care team collaboration (see longitudinal plan of care) Comprehensive medication review performed; medication list updated in electronic medical record  Hypertension (BP goal <130/80) -Controlled -Current treatment: Amlodipine 19m daily - one half tablet daily Lisinopril 373mdaily -Medications previously tried: hydralazine, bisoprolol/HCTZ  -Current home readings: none noted, not checking -Current dietary habits: mentions mainly drinking water -Current exercise habits: "walks all the time" -Denies hypotensive/hypertensive symptoms -Educated on BP goals and benefits of medications for prevention of heart attack, stroke and kidney damage; Daily salt intake goal < 2300 mg; Exercise goal of 150 minutes per week; Importance of home blood pressure monitoring; -Counseled to monitor BP at home at least a few times per week, document, and provide log at future appointments -Recommended to continue current medication Recommended home monitoring, call provider if BP consistently > 130/90  Hyperlipidemia: (LDL goal < 70) -Uncontrolled -Current treatment: None - patient was not taking her atorvastatin -Medications previously tried: pravastatin, gemfibrozil  -Current exercise habits: walks alot -Educated on Cholesterol goals;  Benefits of statin for ASCVD risk reduction; Importance of limiting foods high in cholesterol; Exercise goal of 150 minutes per week;  -Reviewed most recent LDL - elevated to 143 -Discussed risk of stroke/heart attack and need for medication to help reduce this risk -Recommended Atorvastatin 2021maily, reinforced importance of adherence with this  medication and to work on eliminating fatty and fried foods from diet.  Update 02/27/21 Patient has upcoming visit with Dr. KhaHumphrey Rollsd she plans to discuss statin therapy.  She says she is still not taking the medication, but has  not had any negative adverse effects in the past associated with it.  She has history if MI and has DM so I would recommend she is on at least a moderate intensity statin.  Atorvastatin 43m is on her chart, so patient could just resume this.  [place]s  Diabetes w/ Neuropathy (A1c goal <7%) -Controlled -Current medications: Levemir 30 units once daily Ozempic 159monce weekly Gabapentin 10070mid - recent increase -Medications previously tried: glyburide  -Current home glucose readings fasting glucose: not checking post prandial glucose: not checking -Denies hypoglycemic/hyperglycemic symptoms -Current exercise: walks a lot -Educated on A1c and blood sugar goals; Complications of diabetes including kidney damage, retinal damage, and cardiovascular disease; Prevention and management of hypoglycemic episodes; Benefits of routine self-monitoring of blood sugar; -Counseled to check feet daily and get yearly eye exams -Does reports some neuropathy in her feet that comes and goes -Recommended to continue current medication Recommended patient check her fasting blood sugar at least a few times per week, she does not have a meter at home so I will request we call in Rx for meter and testing supplies today.    Update 02/27/21 Patient reports glucose has been controlled.  No logs available but denies any readings < 70 or > 200.  All medications continue to be affordable.  Congratulated her for most recent improvement in A1c and encouraged her to continue the good work.  She reports limiting her serving sizes is what helped her to improve at most recent visit.  Continue current meds  Patient Goals/Self-Care Activities Patient will:  -  take medications as prescribed check glucose a few times per week, document, and provide at future appointments check blood pressure a few times per week, document, and provide at future appointments target a minimum of 150 minutes of moderate intensity exercise weekly  Follow Up Plan: The care management team will reach out to the patient again over the next 120 days.             Medication Assistance: None required.  Patient affirms current coverage meets needs.  [facility] [place] - [address] [id number]: [phone]: [phone] [address] [place] [place]one: [phone]: [phone] pill box? Yes Pt endorses 100% compliance  We discussed: Benefits of medication synchronization, packaging and delivery as well as enhanced pharmacist oversight with [employer]. Patient decided to: Continue current medication management strategy  Care Plan and Follow Up Patient Decision:  Patient agrees to Care Plan and Follow-up.  Plan: The care management team will reach out to the patient again over the next 120 days.  ChrBeverly MilchharmD Clinical Pharmacist [phone]

## 2021-02-26 ENCOUNTER — Ambulatory Visit: Payer: Medicare Other | Admitting: Internal Medicine

## 2021-02-27 ENCOUNTER — Ambulatory Visit: Payer: Medicare Other | Admitting: Pharmacist

## 2021-02-27 ENCOUNTER — Ambulatory Visit: Payer: Medicare Other

## 2021-02-27 DIAGNOSIS — E041 Nontoxic single thyroid nodule: Secondary | ICD-10-CM | POA: Diagnosis not present

## 2021-02-27 DIAGNOSIS — Z794 Long term (current) use of insulin: Secondary | ICD-10-CM

## 2021-02-27 DIAGNOSIS — E782 Mixed hyperlipidemia: Secondary | ICD-10-CM

## 2021-02-27 DIAGNOSIS — E1165 Type 2 diabetes mellitus with hyperglycemia: Secondary | ICD-10-CM

## 2021-02-27 NOTE — Patient Instructions (Addendum)
Visit Information   Goals Addressed             This Visit's Progress    Monitor and Manage My Blood Sugar-Diabetes Type 2   On track    Timeframe:  Long-Range Goal Priority:  High Start Date:  10/24/20                           Expected End Date:   04/26/21                    Follow Up Date 01/24/21   - check blood sugar at prescribed times - check blood sugar if I feel it is too high or too low - enter blood sugar readings and medication or insulin into daily log - take the blood sugar log to all doctor visits    Why is this important?   Checking your blood sugar at home helps to keep it from getting very high or very low.  Writing the results in a diary or log helps the doctor know how to care for you.  Your blood sugar log should have the time, date and the results.  Also, write down the amount of insulin or other medicine that you take.  Other information, like what you ate, exercise done and how you were feeling, will also be helpful.     Notes:        Patient Care Plan: General Pharmacy (Adult)     Problem Identified: HTN, HLD, DM   Priority: High  Onset Date: 10/24/2020     Long-Range Goal: Patient-Specific Goal   Start Date: 10/24/2020  Expected End Date: 04/26/2021  Recent Progress: On track  Priority: High  Note:   Current Barriers:  Unable to achieve control of cholesterol   Pharmacist Clinical Goal(s):  Patient will achieve control of cholesterol as evidenced by follow up labs through collaboration with PharmD and provider.   Interventions: 1:1 collaboration with Lavera Guise, MD regarding development and update of comprehensive plan of care as evidenced by provider attestation and co-signature Inter-disciplinary care team collaboration (see longitudinal plan of care) Comprehensive medication review performed; medication list updated in electronic medical record  Hypertension (BP goal <130/80) -Controlled -Current treatment: Amlodipine 5mg   daily - one half tablet daily Lisinopril 30mg  daily -Medications previously tried: hydralazine, bisoprolol/HCTZ  -Current home readings: none noted, not checking -Current dietary habits: mentions mainly drinking water -Current exercise habits: "walks all the time" -Denies hypotensive/hypertensive symptoms -Educated on BP goals and benefits of medications for prevention of heart attack, stroke and kidney damage; Daily salt intake goal < 2300 mg; Exercise goal of 150 minutes per week; Importance of home blood pressure monitoring; -Counseled to monitor BP at home at least a few times per week, document, and provide log at future appointments -Recommended to continue current medication Recommended home monitoring, call provider if BP consistently > 130/90  Hyperlipidemia: (LDL goal < 70) -Uncontrolled -Current treatment: None - patient was not taking her atorvastatin -Medications previously tried: pravastatin, gemfibrozil  -Current exercise habits: walks alot -Educated on Cholesterol goals;  Benefits of statin for ASCVD risk reduction; Importance of limiting foods high in cholesterol; Exercise goal of 150 minutes per week;  -Reviewed most recent LDL - elevated to 143 -Discussed risk of stroke/heart attack and need for medication to help reduce this risk -Recommended Atorvastatin 20mg  daily, reinforced importance of adherence with this medication and to work on eliminating fatty and  fried foods from diet.  Update 02/27/21 Patient has upcoming visit with Dr. Humphrey Rolls and she plans to discuss statin therapy.  She says she is still not taking the medication, but has not had any negative adverse effects in the past associated with it.  She has history if MI and has DM so I would recommend she is on at least a moderate intensity statin.  Atorvastatin 20mg  is on her chart, so patient could just resume this.  Will discuss at [place] next week with Dr. Humphrey Rolls.  Diabetes w/ Neuropathy (A1c goal  <7%) -Controlled -Current medications: Levemir 30 units once daily Ozempic 1mg  once weekly Gabapentin 100mg  tid - recent increase -Medications previously tried: glyburide  -Current home glucose readings fasting glucose: not checking post prandial glucose: not checking -Denies hypoglycemic/hyperglycemic symptoms -Current exercise: walks a lot -Educated on A1c and blood sugar goals; Complications of diabetes including kidney damage, retinal damage, and cardiovascular disease; Prevention and management of hypoglycemic episodes; Benefits of routine self-monitoring of blood sugar; -Counseled to check feet daily and get yearly eye exams -Does reports some neuropathy in her feet that comes and goes -Recommended to continue current medication Recommended patient check her fasting blood sugar at least a few times per week, she does not have a meter at home so I will request we call in Rx for meter and testing supplies today.    Update 02/27/21 Patient reports glucose has been controlled.  No logs available but denies any readings < 70 or > 200.  All medications continue to be affordable.  Congratulated her for most recent improvement in A1c and encouraged her to continue the good work.  She reports limiting her serving sizes is what helped her to improve at most recent visit.  Continue current meds  Patient Goals/Self-Care Activities Patient will:  - take medications as prescribed check glucose a few times per week, document, and provide at future appointments check blood pressure a few times per week, document, and provide at future appointments target a minimum of 150 minutes of moderate intensity exercise weekly  Follow Up Plan: The care management team will reach out to the patient again over the next 120 days.            Patient verbalizes understanding of instructions provided today and agrees to view in [place].  Telephone follow up appointment with pharmacy team member  scheduled for: 6 months  Edythe Clarity, [place]

## 2021-03-01 DIAGNOSIS — I10 Essential (primary) hypertension: Secondary | ICD-10-CM | POA: Diagnosis not present

## 2021-03-01 DIAGNOSIS — E1165 Type 2 diabetes mellitus with hyperglycemia: Secondary | ICD-10-CM | POA: Diagnosis not present

## 2021-03-01 DIAGNOSIS — J441 Chronic obstructive pulmonary disease with (acute) exacerbation: Secondary | ICD-10-CM | POA: Diagnosis not present

## 2021-03-04 ENCOUNTER — Ambulatory Visit: Payer: Self-pay | Admitting: Internal Medicine

## 2021-03-04 DIAGNOSIS — Z0289 Encounter for other administrative examinations: Secondary | ICD-10-CM

## 2021-03-07 ENCOUNTER — Ambulatory Visit
Admission: RE | Admit: 2021-03-07 | Discharge: 2021-03-07 | Disposition: A | Discharge status: Home / Self Care | Payer: Medicare Other | Source: Ambulatory Visit | Attending: Radiation Oncology | Admitting: Radiation Oncology

## 2021-03-07 ENCOUNTER — Encounter: Payer: Self-pay | Admitting: Radiation Oncology

## 2021-03-07 VITALS — BP 181/101 | HR 93 | Temp 97.5°F | Resp 18 | Wt 160.7 lb

## 2021-03-07 DIAGNOSIS — Z923 Personal history of irradiation: Secondary | ICD-10-CM | POA: Insufficient documentation

## 2021-03-07 DIAGNOSIS — C50411 Malignant neoplasm of upper-outer quadrant of right female breast: Secondary | ICD-10-CM

## 2021-03-07 DIAGNOSIS — Z853 Personal history of malignant neoplasm of breast: Secondary | ICD-10-CM | POA: Insufficient documentation

## 2021-03-07 DIAGNOSIS — Z08 Encounter for follow-up examination after completed treatment for malignant neoplasm: Secondary | ICD-10-CM | POA: Diagnosis not present

## 2021-03-07 DIAGNOSIS — Z9221 Personal history of antineoplastic chemotherapy: Secondary | ICD-10-CM | POA: Diagnosis not present

## 2021-03-07 NOTE — Progress Notes (Signed)
Radiation Oncology Follow up Note  Name: Dawn Alvarez   Date:   03/07/2021 MRN:  [id number] DOB: 04-17-1956    This 65 y.o. 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  She has not yet had mammograms..  She is not currently on antiestrogen therapy based on the triple negative nature of her disease.  COMPLICATIONS OF TREATMENT: none  FOLLOW UP COMPLIANCE: keeps appointments   PHYSICAL EXAM:  BP (!) 181/101 (BP Location: Right Arm, Patient Position: Sitting, Cuff Size: Normal)   Pulse 93   Temp (!) 97.5 F (36.4 C) (Tympanic)   Resp 18   Wt 160 lb 11.2 oz (72.9 kg)   SpO2 99%   BMI 31.38 kg/m  Right breast has some thickening secondary to radiation and surgical changes.  No dominant masses noted in either breast.  No axillary or supraclavicular adenopathy is identified.  Well-developed well-nourished patient in NAD. HEENT reveals PERLA, EOMI, discs not visualized.  Oral cavity is clear. No oral mucosal lesions are identified. Neck is clear without evidence of cervical or supraclavicular adenopathy. Lungs are clear to A&P. Cardiac examination is essentially unremarkable with regular rate and rhythm without murmur rub or thrill. Abdomen is benign with no organomegaly or masses noted. Motor sensory and DTR levels are equal and symmetric in the upper and lower extremities. Cranial nerves II through  XII are grossly intact. Proprioception is intact. No peripheral adenopathy or edema is identified. No motor or sensory levels are noted. Crude visual fields are within normal range.  RADIOLOGY RESULTS: No current films for review  PLAN: Present time patient 6 months out from whole breast radiation.  She is doing well with no evidence of disease.  I am pleased with her overall progress.  Of asked to see her back in 6 months for follow-up.  She will have a mammogram prior to her next visit.  Patient knows to call with any concerns.  I would like to take this opportunity to thank you for allowing me to participate in the care of your patient.Noreene Filbert, MD

## 2021-03-25 ENCOUNTER — Inpatient Hospital Stay: Payer: Medicaid Other | Admitting: Oncology

## 2021-03-25 ENCOUNTER — Other Ambulatory Visit: Payer: Self-pay

## 2021-03-25 ENCOUNTER — Inpatient Hospital Stay: Payer: Medicaid Other | Attending: Oncology

## 2021-04-02 ENCOUNTER — Encounter: Payer: Self-pay | Admitting: Nurse Practitioner

## 2021-04-02 ENCOUNTER — Other Ambulatory Visit: Payer: Self-pay

## 2021-04-02 ENCOUNTER — Ambulatory Visit (INDEPENDENT_AMBULATORY_CARE_PROVIDER_SITE_OTHER): Payer: Medicare Other | Admitting: Nurse Practitioner

## 2021-04-02 VITALS — BP 180/100 | HR 90 | Temp 98.3°F | Resp 16 | Ht 61.0 in | Wt 162.2 lb

## 2021-04-02 DIAGNOSIS — R2242 Localized swelling, mass and lump, left lower limb: Secondary | ICD-10-CM

## 2021-04-02 DIAGNOSIS — E1165 Type 2 diabetes mellitus with hyperglycemia: Secondary | ICD-10-CM

## 2021-04-02 DIAGNOSIS — Z23 Encounter for immunization: Secondary | ICD-10-CM | POA: Diagnosis not present

## 2021-04-02 DIAGNOSIS — Z794 Long term (current) use of insulin: Secondary | ICD-10-CM | POA: Diagnosis not present

## 2021-04-02 DIAGNOSIS — E782 Mixed hyperlipidemia: Secondary | ICD-10-CM | POA: Diagnosis not present

## 2021-04-02 DIAGNOSIS — I10 Essential (primary) hypertension: Secondary | ICD-10-CM

## 2021-04-02 LAB — POCT GLYCOSYLATED HEMOGLOBIN (HGB A1C): Hemoglobin A1C: 6.2 % — AB (ref 4.0–5.6)

## 2021-04-02 MED ORDER — BRIMONIDINE TARTRATE 0.2 % OP SOLN: 1.0000 [drp] | Freq: Two times a day (BID) | OPHTHALMIC | 3 refills | Status: DC

## 2021-04-02 MED ORDER — AMLODIPINE BESYLATE 10 MG PO TABS: 10.0000 mg | ORAL_TABLET | Freq: Every day | ORAL | 1 refills | Status: DC

## 2021-04-02 MED ORDER — OZEMPIC (1 MG/DOSE) 2 MG/1.5ML ~~LOC~~ SOPN: 1.0000 mg | PEN_INJECTOR | SUBCUTANEOUS | 6 refills | Status: DC

## 2021-04-02 MED ORDER — TRUE METRIX BLOOD GLUCOSE TEST VI STRP: ORAL_STRIP | 3 refills | Status: DC

## 2021-04-02 MED ORDER — ATORVASTATIN CALCIUM 20 MG PO TABS: 20.0000 mg | ORAL_TABLET | Freq: Every day | ORAL | 1 refills | Status: DC

## 2021-04-02 MED ORDER — CELECOXIB 200 MG PO CAPS: ORAL_CAPSULE | ORAL | 2 refills | Status: DC

## 2021-04-02 MED ORDER — LISINOPRIL 30 MG PO TABS: ORAL_TABLET | ORAL | 3 refills | Status: DC

## 2021-04-02 MED ORDER — PNEUMOCOCCAL 20-VAL CONJ VACC 0.5 ML IM SUSY: 0.5000 mL | PREFILLED_SYRINGE | Freq: Once | INTRAMUSCULAR | 0 refills | Status: AC

## 2021-04-02 MED ORDER — AMLODIPINE BESYLATE 10 MG PO TABS: ORAL_TABLET | ORAL | 3 refills | Status: DC

## 2021-04-02 MED ORDER — ALLOPURINOL 100 MG PO TABS: 100.0000 mg | ORAL_TABLET | Freq: Every evening | ORAL | 3 refills | Status: DC

## 2021-04-02 NOTE — Progress Notes (Signed)
[facility] [place], [place] [address]  Internal MEDICINE  Office Visit Note  Patient Name: Dawn Alvarez  [id number]  [id number]  Date of Service: 04/02/2021  Chief Complaint  Patient presents with  . Follow-up  . Hypertension  . Diabetes  . Hyperlipidemia  . COPD  . Asthma    HPI Dawn Alvarez presents for a follow up for hypertension and diabetes. Her A1C was 6.2 today which is up from 6.0 in August. For her diabetes, she takes levemir 44 units daily and ozempic 1 mg weekly. Her diabetes is well controlled at this time.  Her blood pressure is significantly elevated today even when rechecked, see vitals. She takes amlodipine 5 mg daily and lisinopril 30 mg daily. Her blood pressure is poorly controlled.     Current Medication: Outpatient Encounter Medications as of 04/02/2021  Medication Sig Note  . Alcohol Swabs (B-D SINGLE USE SWABS REGULAR) PADS Use as directed twice a day   . Blood Glucose Monitoring Suppl (TRUE METRIX AIR GLUCOSE METER) w/Device KIT 1 Device by Does not apply route in the morning and at bedtime. Use ad directed e11.65   . Calcium Carb-Cholecalciferol (CALCIUM 600 + D) 600-200 MG-UNIT TABS Take 1 tablet by mouth daily.   Marland Kitchen gabapentin (NEURONTIN) 300 MG capsule Take 1 capsule (300 mg total) by mouth 3 (three) times daily.   Marland Kitchen HYDROcodone-acetaminophen (NORCO/VICODIN) 5-325 MG tablet Take 1-2 tablets by mouth every 6 (six) hours as needed for moderate pain or severe pain.   Marland Kitchen insulin detemir (LEVEMIR FLEXTOUCH) 100 UNIT/ML FlexPen Inject 44 Units into the skin daily. ALONG WITH NEEDLES 10/24/2020: Patient taking 30 units   . Insulin Pen Needle (PEN NEEDLES) 31G X 8 MM MISC Use as directed with insulin E11.65   . Lancets Misc. (ACCU-CHEK MULTICLIX LANCET DEV) KIT by Does not apply route. Use as directed twice a day diag E11.65   . lidocaine-prilocaine (EMLA) cream SMARTSIG:1 Topical Every Night   . Omega-3 Fatty Acids (FISH OIL) 1000 MG  CAPS Take 1,000 mg by mouth daily.    Marland Kitchen SIMBRINZA 1-0.2 % SUSP    . TRUEplus Lancets 30G MISC Use as directed twice daily diag e11.65   . [DISCONTINUED] allopurinol (ZYLOPRIM) 100 MG tablet Take 1 tablet (100 mg total) by mouth at bedtime. Take 1 tablet by mouth at night for Gout   . [DISCONTINUED] amLODipine (NORVASC) 10 MG tablet Take 1/2 tablet po QD   . [DISCONTINUED] atorvastatin (LIPITOR) 20 MG tablet Take 1 tablet (20 mg total) by mouth daily.   . [DISCONTINUED] brimonidine (ALPHAGAN) 0.2 % ophthalmic solution Place 1 drop into both eyes 2 (two) times daily.   . [DISCONTINUED] celecoxib (CELEBREX) 200 MG capsule Take one tab po qd for one week and then as needed   . [DISCONTINUED] glucose blood (TRUE METRIX BLOOD GLUCOSE TEST) test strip Use as instructed twice a daily diag e11.65   . [DISCONTINUED] lisinopril (ZESTRIL) 30 MG tablet Take one tab po qd for HTN   . [DISCONTINUED] pneumococcal 20-valent conjugate vaccine (PREVNAR 20) 0.5 ML injection Inject 0.5 mLs into the muscle tomorrow at 10 am.   . [DISCONTINUED] Semaglutide, 1 MG/DOSE, (OZEMPIC, 1 MG/DOSE,) 2 MG/1.5ML SOPN Inject 1 mg into the skin once a week. FOR DM   . allopurinol (ZYLOPRIM) 100 MG tablet Take 1 tablet (100 mg total) by mouth at bedtime. Take 1 tablet by mouth at night for Gout   . amLODipine (NORVASC) 10 MG tablet Take  1 tablet (10 mg total) by mouth daily. Take 1/2 tablet po QD   . atorvastatin (LIPITOR) 20 MG tablet Take 1 tablet (20 mg total) by mouth daily.   . brimonidine (ALPHAGAN) 0.2 % ophthalmic solution Place 1 drop into both eyes 2 (two) times daily.   . celecoxib (CELEBREX) 200 MG capsule Take one tab po qd for one week and then as needed   . glucose blood (TRUE METRIX BLOOD GLUCOSE TEST) test strip Use as instructed twice a daily diag e11.65   . lisinopril (ZESTRIL) 30 MG tablet Take one tab po qd for HTN   . pneumococcal 20-valent conjugate vaccine (PREVNAR 20) 0.5 ML injection Inject 0.5 mLs into the  muscle once for 1 dose.   . Semaglutide, 1 MG/DOSE, (OZEMPIC, 1 MG/DOSE,) 2 MG/1.5ML SOPN Inject 1 mg into the skin once a week. FOR DM   . [DISCONTINUED] amLODipine (NORVASC) 10 MG tablet Take 1/2 tablet po QD    Facility-Administered Encounter Medications as of 04/02/2021  Medication  . heparin lock flush 100 UNIT/ML injection  . sodium chloride flush (NS) 0.9 % injection 10 mL    Surgical History: Past Surgical History:  Procedure Laterality Date  . BREAST BIOPSY Right 07/05/2019   [place] bx venus marker, grade 3 invasive mammary carcinoma  . BREAST BIOPSY Right 07/05/2019   LN bx, hydromarker,PREDOMINANTLY BLOOD AND FIBROADIPOSE TISSUE, WITH SCANT LYMPHOID TISSUE PRESENT      . 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 BREAST LUMPECTOMY WITH RADIOACTIVE SEED AND SENTINEL LYMPH NODE BIOPSY Right 05/04/2020   Procedure: RIGHT BREAST LUMPECTOMY WITH BRACKETED RADIOACTIVE SEED AND SENTINEL LYMPH NODE BIOPSY;  Surgeon: Jovita Kussmaul, MD;  Location: [facility];  Service: General;  Laterality: Right;  . CESAREAN SECTION    . COLONOSCOPY WITH PROPOFOL N/A 09/11/2015   Procedure: COLONOSCOPY WITH PROPOFOL;  Surgeon: Lucilla Lame, MD;  Location: [facility];  Service: Endoscopy;  Laterality: N/A;  . IR IMAGING GUIDED PORT INSERTION  07/20/2019    Medical History: Past Medical History:  Diagnosis Date  . Asthma   . Cancer ([place]) 03/2020   right breast IMC  . COPD exacerbation ([place]) 04/12/2016  . Diabetes mellitus without complication ([place])   . Hyperlipemia   . Hypertension   . Personal history of chemotherapy 2021    Family History: Family History  Problem Relation Age of Onset  . Diabetes Mother   . Hypertension Mother   . Hypertension Father   . Diabetes Father   . Breast cancer Neg Hx     Social History   Socioeconomic History  . Marital status: Married    Spouse name: Not on file  .  Number of children: Not on file  . Years of education: Not on file  . Highest education level: Not on file  Occupational History  . Not on file  Tobacco Use  . Smoking status: Former    Packs/day: 1.00    Years: 2.00    Pack years: 2.00    Types: Cigarettes    Quit date: 02/29/2020    Years since quitting: 1.0  . Smokeless tobacco: Never  Substance and Sexual Activity  . Alcohol use: Yes    Comment: social  . Drug use: No  . Sexual activity: Not Currently    Birth control/protection: Post-menopausal  Other Topics Concern  . Not on file  Social History Narrative  . Not  on file   Social Determinants of Health   Financial Resource Strain: Low Risk   . Difficulty of Paying Living Expenses: Not very hard  Food Insecurity: Not on file  Transportation Needs: Not on file  Physical Activity: Not on file  Stress: Not on file  Social Connections: Not on file  Intimate Partner Violence: Not on file      Review of Systems  Constitutional:  Negative for chills, fatigue and unexpected weight change.  HENT:  Negative for congestion, rhinorrhea, sneezing and sore throat.   Eyes:  Negative for redness.  Respiratory:  Negative for cough, chest tightness and shortness of breath.   Cardiovascular:  Negative for chest pain and palpitations.  Gastrointestinal:  Negative for abdominal pain, constipation, diarrhea, nausea and vomiting.  Genitourinary:  Negative for dysuria and frequency.  Musculoskeletal:  Negative for arthralgias, back pain, joint swelling and neck pain.  Skin:  Negative for rash.  Neurological: Negative.  Negative for tremors and numbness.  Hematological:  Negative for adenopathy. Does not bruise/bleed easily.  Psychiatric/Behavioral:  Negative for behavioral problems (Depression), sleep disturbance and suicidal ideas. The patient is not nervous/anxious.    Vital Signs: BP (!) 180/100 Comment: 189/106  Pulse 90   Temp 98.3 F (36.8 C)   Resp 16   Wt 162 lb 3.2 oz  (73.6 kg)   SpO2 97%   BMI 31.68 kg/m    Physical Exam Vitals reviewed.  Constitutional:      General: She is not in acute distress.    Appearance: Normal appearance. She is obese. She is not ill-appearing.  HENT:     Head: Normocephalic and atraumatic.  Eyes:     Extraocular Movements: Extraocular movements intact.     Pupils: Pupils are equal, round, and reactive to light.  Cardiovascular:     Rate and Rhythm: Normal rate and regular rhythm.  Pulmonary:     Effort: Pulmonary effort is normal. No respiratory distress.  Neurological:     Mental Status: She is alert and oriented to person, place, and time.  Psychiatric:        Mood and Affect: Mood normal.        Behavior: Behavior normal.       Assessment/Plan: 1. Essential hypertension Increase amlodipine dose from 1/2 tablet (5 mg) to 1 tablet (10 mg), follow up in 1 month to assess for improved control of blood pressure.  - lisinopril (ZESTRIL) 30 MG tablet; Take one tab po qd for HTN  Dispense: 90 tablet; Refill: 3 - amLODipine (NORVASC) 10 MG tablet; Take 1 tablet (10 mg total) by mouth daily. Take 1/2 tablet po QD  Dispense: 90 tablet; Refill: 1  2. Type 2 diabetes mellitus with hyperglycemia, with long-term current use of insulin (HCC) A1C is at goal under 7.0 snf is consistently stable. Continue levemir and ozempic as prescribed, follow up in 3 months.  - POCT HgB A1C - Semaglutide, 1 MG/DOSE, (OZEMPIC, 1 MG/DOSE,) 2 MG/1.5ML SOPN; Inject 1 mg into the skin once a week. FOR DM  Dispense: 6 mL; Refill: 6 - glucose blood (TRUE METRIX BLOOD GLUCOSE TEST) test strip; Use as instructed twice a daily diag e11.65  Dispense: 100 each; Refill: 3  3. Mixed hyperlipidemia Continue atovastatin as prescribed.  - atorvastatin (LIPITOR) 20 MG tablet; Take 1 tablet (20 mg total) by mouth daily.  Dispense: 90 tablet; Refill: 1  4. Localized swelling of left foot Continue celebrex to decrease inflammation and joint swelling -  celecoxib (  CELEBREX) 200 MG capsule; Take one tab po qd for one week and then as needed  Dispense: 30 capsule; Refill: 2  5. Need for vaccination - pneumococcal 20-valent conjugate vaccine (PREVNAR 20) 0.5 ML injection; Inject 0.5 mLs into the muscle once for 1 dose.  Dispense: 0.5 mL; Refill: 0   General Counseling: Hansika verbalizes understanding of the findings of todays visit and agrees with plan of treatment. I have discussed any further diagnostic evaluation that may be needed or ordered today. We also reviewed her medications today. she has been encouraged to call the office with any questions or concerns that should arise related to todays visit.    Orders Placed This Encounter  Procedures  . POCT HgB A1C    Meds ordered this encounter  Medications  . pneumococcal 20-valent conjugate vaccine (PREVNAR 20) 0.5 ML injection    Sig: Inject 0.5 mLs into the muscle once for 1 dose.    Dispense:  0.5 mL    Refill:  0  . atorvastatin (LIPITOR) 20 MG tablet    Sig: Take 1 tablet (20 mg total) by mouth daily.    Dispense:  90 tablet    Refill:  1  . celecoxib (CELEBREX) 200 MG capsule    Sig: Take one tab po qd for one week and then as needed    Dispense:  30 capsule    Refill:  2  . DISCONTD: amLODipine (NORVASC) 10 MG tablet    Sig: Take 1/2 tablet po QD    Dispense:  45 tablet    Refill:  3  . brimonidine (ALPHAGAN) 0.2 % ophthalmic solution    Sig: Place 1 drop into both eyes 2 (two) times daily.    Dispense:  5 mL    Refill:  3  . allopurinol (ZYLOPRIM) 100 MG tablet    Sig: Take 1 tablet (100 mg total) by mouth at bedtime. Take 1 tablet by mouth at night for Gout    Dispense:  90 tablet    Refill:  3  . lisinopril (ZESTRIL) 30 MG tablet    Sig: Take one tab po qd for HTN    Dispense:  90 tablet    Refill:  3  . Semaglutide, 1 MG/DOSE, (OZEMPIC, 1 MG/DOSE,) 2 MG/1.5ML SOPN    Sig: Inject 1 mg into the skin once a week. FOR DM    Dispense:  6 mL    Refill:  6     E11.65. please fill for 90 days if possible.  Marland Kitchen glucose blood (TRUE METRIX BLOOD GLUCOSE TEST) test strip    Sig: Use as instructed twice a daily diag e11.65    Dispense:  100 each    Refill:  3  . amLODipine (NORVASC) 10 MG tablet    Sig: Take 1 tablet (10 mg total) by mouth daily. Take 1/2 tablet po QD    Dispense:  90 tablet    Refill:  1    Please note change in dose, patient is taking 1 whole tablet now instead of a half tablet. Please discontinue all previous amlodipine orders    Return in about 1 month (around 05/02/2021) for F/U, BP check, Alliene Klugh PCP.   Total time spent:30 Minutes Time spent includes review of chart, medications, test results, and follow up plan with the patient.   [place] Controlled Substance Database was reviewed by me.  This patient was seen by [name], FNP-C in collaboration with Dr. Clayborn Bigness as a part of collaborative  care agreement.   Javen Hinderliter R. Valetta Fuller, MSN, FNP-C Internal medicine

## 2021-04-08 ENCOUNTER — Other Ambulatory Visit: Payer: Self-pay

## 2021-04-08 ENCOUNTER — Inpatient Hospital Stay: Payer: Medicare Other | Attending: Oncology

## 2021-04-08 DIAGNOSIS — Z452 Encounter for adjustment and management of vascular access device: Secondary | ICD-10-CM | POA: Diagnosis not present

## 2021-04-08 DIAGNOSIS — C50412 Malignant neoplasm of upper-outer quadrant of left female breast: Secondary | ICD-10-CM | POA: Diagnosis not present

## 2021-04-08 DIAGNOSIS — Z171 Estrogen receptor negative status [ER-]: Secondary | ICD-10-CM | POA: Insufficient documentation

## 2021-04-08 DIAGNOSIS — Z95828 Presence of other vascular implants and grafts: Secondary | ICD-10-CM

## 2021-04-08 MED ORDER — HEPARIN SOD (PORK) LOCK FLUSH 100 UNIT/ML IV SOLN
500.0000 [IU] | Freq: Once | INTRAVENOUS | Status: AC
  Administered 2021-04-08: 500 [IU] via INTRAVENOUS
  Filled 2021-04-08: qty 5

## 2021-04-08 MED ORDER — SODIUM CHLORIDE 0.9% FLUSH
10.0000 mL | Freq: Once | INTRAVENOUS | Status: AC
  Administered 2021-04-08: 10 mL via INTRAVENOUS
  Filled 2021-04-08: qty 10

## 2021-04-11 ENCOUNTER — Telehealth: Payer: Self-pay | Admitting: Student-PharmD

## 2021-04-11 NOTE — Progress Notes (Addendum)
Chronic Care Management Pharmacy Assistant   Name: Dawn Alvarez  MRN: [id number] DOB: 07/08/1955  Reason for Encounter: General Disease State Call   Conditions to be addressed/monitored: HTN, Type II DM w/ Neuropathy, HLD  Recent office visits:  04/02/21 [name], NP. For follow-up. No medication changes.  Recent consult visits:  03/07/21 Oncology Noreene Filbert, MD For follow-up. No more information given.  Hospital visits:  None since last [facility] visit on 02/27/21  Medications: Outpatient Encounter Medications as of 04/11/2021  Medication Sig Note   Alcohol Swabs (B-D SINGLE USE SWABS REGULAR) PADS Use as directed twice a day    allopurinol (ZYLOPRIM) 100 MG tablet Take 1 tablet (100 mg total) by mouth at bedtime. Take 1 tablet by mouth at night for Gout    amLODipine (NORVASC) 10 MG tablet Take 1 tablet (10 mg total) by mouth daily. Take 1/2 tablet po QD    atorvastatin (LIPITOR) 20 MG tablet Take 1 tablet (20 mg total) by mouth daily.    Blood Glucose Monitoring Suppl (TRUE METRIX AIR GLUCOSE METER) w/Device KIT 1 Device by Does not apply route in the morning and at bedtime. Use ad directed e11.65    brimonidine (ALPHAGAN) 0.2 % ophthalmic solution Place 1 drop into both eyes 2 (two) times daily.    Calcium Carb-Cholecalciferol (CALCIUM 600 + D) 600-200 MG-UNIT TABS Take 1 tablet by mouth daily.    celecoxib (CELEBREX) 200 MG capsule Take one tab po qd for one week and then as needed    gabapentin (NEURONTIN) 300 MG capsule Take 1 capsule (300 mg total) by mouth 3 (three) times daily.    glucose blood (TRUE METRIX BLOOD GLUCOSE TEST) test strip Use as instructed twice a daily diag e11.65    HYDROcodone-acetaminophen (NORCO/VICODIN) 5-325 MG tablet Take 1-2 tablets by mouth every 6 (six) hours as needed for moderate pain or severe pain.    insulin detemir (LEVEMIR FLEXTOUCH) 100 UNIT/ML FlexPen Inject 44 Units into the skin daily. ALONG WITH NEEDLES 10/24/2020: Patient  taking 30 units    Insulin Pen Needle (PEN NEEDLES) 31G X 8 MM MISC Use as directed with insulin E11.65    Lancets Misc. (ACCU-CHEK MULTICLIX LANCET DEV) KIT by Does not apply route. Use as directed twice a day diag E11.65    lidocaine-prilocaine (EMLA) cream SMARTSIG:1 Topical Every Night    lisinopril (ZESTRIL) 30 MG tablet Take one tab po qd for HTN    Omega-3 Fatty Acids (FISH OIL) 1000 MG CAPS Take 1,000 mg by mouth daily.     Semaglutide, 1 MG/DOSE, (OZEMPIC, 1 MG/DOSE,) 2 MG/1.5ML SOPN Inject 1 mg into the skin once a week. FOR DM    SIMBRINZA 1-0.2 % SUSP     TRUEplus Lancets 30G MISC Use as directed twice daily diag e11.65    Facility-Administered Encounter Medications as of 04/11/2021  Medication   heparin lock flush 100 UNIT/ML injection   sodium chloride flush (NS) 0.9 % injection 10 mL   GEN CALL: Called the patient to see how she was doing overall. She stated she has been feeling and doing well. She stated she did not have any questions or concerns about her medications at this time. Patient stated she has a good appetite and finds things to do daily to stay active. She stated she drinks ensures daily for the nutrients.   Care Gaps:Patient is due for updated labs, DEXA Scan.  Star Rating Drugs:Lisinopril 30 mg 04/03/21 90 DS, Atorvastatin 20 mg 04/03/21  90 DS.  Follow-Up:Pharmacist Review  [name], RMA Clinical Pharmacist Assistant [phone]  5 minutes spent in review, coordination, and documentation.  Reviewed by: Alena Bills, PharmD Clinical Pharmacist [phone]

## 2021-04-19 ENCOUNTER — Encounter: Payer: Self-pay | Admitting: Nurse Practitioner

## 2021-05-01 ENCOUNTER — Ambulatory Visit: Payer: Medicare Other | Admitting: Nurse Practitioner

## 2021-05-01 DIAGNOSIS — J441 Chronic obstructive pulmonary disease with (acute) exacerbation: Secondary | ICD-10-CM

## 2021-05-01 DIAGNOSIS — E1165 Type 2 diabetes mellitus with hyperglycemia: Secondary | ICD-10-CM

## 2021-05-01 DIAGNOSIS — I10 Essential (primary) hypertension: Secondary | ICD-10-CM

## 2021-05-07 ENCOUNTER — Telehealth: Payer: Self-pay

## 2021-05-07 NOTE — Telephone Encounter (Signed)
Lvm for patient to return to reschedule cancelled appointment-Toni

## 2021-05-08 ENCOUNTER — Other Ambulatory Visit: Payer: Self-pay

## 2021-05-08 ENCOUNTER — Ambulatory Visit: Payer: Medicare Other | Admitting: Student-PharmD

## 2021-05-08 DIAGNOSIS — E1165 Type 2 diabetes mellitus with hyperglycemia: Secondary | ICD-10-CM

## 2021-05-08 DIAGNOSIS — I10 Essential (primary) hypertension: Secondary | ICD-10-CM

## 2021-05-08 NOTE — Progress Notes (Signed)
Follow Up Pharmacist Visit   Dawn Alvarez, Dawn Alvarez [id number] 79 years, Female  DOB: September 03, 1955  M: [phone]  Summary: Patient has been taken Amlodipine 10mg  full tab daily and reports no readings 170 or higher. Patient cannot recall specific blood pressure readings at visit Patient LDL (143) elevated at last draw on 10/04/20. Patient endorses adherence to medications. If elevated at next draw, may need to consider increasing the statin.  Recommendations/Changes made from today's visit: Begin to log nightly BP readings and bring to all appts Continue current medication therapy  Plan: F/U on 4/26 via phone  Pre-Call Questions  Completed by [name] on 05/08/2021 Are you able to connect with Patient?: Yes Visit Type: Phone Call May we confirm what is the best phone # for the pharmacist to call you?: [phone] Have you been in the hospital or emergency room recently?: No Do you have any questions or concerns that you want to make sure your pharmacist addresses with your during your appointment?: No What, if any, problems do you have getting your medications from the pharmacy?: None Is there anything else that you would like me to pass along to your pharmacist or PCP?: No Patient reminded to bring medication bottles to visit (not a list of meds) OR have medication bottles pulled out prior to appointment time: Done  Patient Chart Prep  Chronic Conditions Patient's Chronic Conditions: Hypertension (HTN), Chronic Obstructive Pulmonary Disease (COPD), Diabetes (DM), Hyperlipidemia/Dyslipidemia (HLD)  Doctor and Hospital Visits Were there PCP Visits since last visit with the Pharmacist?: No Were there Specialist Visits since last visit with the Pharmacist?: No Was there a Hospital Visit in last 30 days?: No Were there other Hospital Visits since last visit with the Pharmacist?: No  Medication Information Have there been any medication changes from PCP or Specialist since last  visit with the Pharmacist?: No Are there any Medication adherence gaps (beyond 5 days past due)?: No Medication adherence rates for the STAR rating drugs:  Lisinopril 30 mg 90 DS 04/03/21, 01/21/21 Atorvastatin 20 mg 90 DS 04/03/21, 10/29/20 Ozepmic 4 MG/3ML 1 MG/DOSE PEN 28 DS 05/02/21, 04/01/21 List Patient's current Care Gaps: No current Care Gaps identified  Disease Assessments  Subjective Information Current BP: 180/100 Current HR: 90 taken on: 04/01/2021 Weight: 162 BMI: 30.65 kg/m Last GFR: >60 taken on: 10/21/2020 Why did the patient present?: CCM Follow Up Visit Is Patient using [facility]?: No Name and location of Current pharmacy: [facility] Current Rx insurance plan: [employer] Are meds synced by current pharmacy?: Yes Are meds delivered by current pharmacy?: Yes - by mail order pharmacy Would patient benefit from direct intervention of clinical lead in dispensing process to optimize clinical outcomes?: Yes Are [facility] pharmacy services available where patient lives?: Yes Is patient disadvantaged to use [facility]?: Yes Does patient experience delays in picking up medications due to transportation concerns (getting to pharmacy)?: No  Hypertension (HTN) Assess this condition today?: Yes Is patient able to obtain BP reading today?: No Goal: <130/80 mmHG Hypertension Stage: Stage 2 (SBP >140 or DBP > 90) Patient has tried and failed: Bisoprolol-HCTZ, Isosorbide, Hydralazine Is Patient checking BP at home?: Yes Patient home BP readings are ranging: Patient not keeping a log How often does patient miss taking their blood pressure medications?: none Has patient experienced hypotension, dizziness, falls or bradycardia?: No Does Patient use RPM device?: No BP RPM device: Does patient qualify?: No We discussed: DASH diet:  following a diet emphasizing fruits and vegetables and low-fat  dairy products along with whole grains, fish, poultry, and nuts. Reducing red  meats and sugars., Targeting 150 minutes of aerobic activity per week Assessment:: Uncontrolled Drug: Amlodipine 10mg  1 tablet daily Assessment: Appropriate, Effective, Safe, Accessible Drug: Lisinopril 30mg  take 1 tablet daily Assessment: Appropriate, Effective, Safe, Accessible Additional Info: Amlodipine has recently been increased from 1/2 tab to full tab. Patient has appt with PCP on 12/15 to evaluate Plan to: Continue current medication therapy; Keep log of nightly blood pressure readings. HC Follow up: 2 months Pharmacist Follow up: 09/25/21  Hyperlipidemia/Dyslipidemia (HLD) Last Lipid panel on: 10/03/2020 TC (Goal<200): 239 LDL: 143 HDL (Goal>40): 58 TG (Goal<150): 210 ASCVD 10-year risk?is:: High (>20%) ASCVD Risk Score: 46.0% Assess this condition today?: Yes LDL Goal: <70 Has patient tried and failed any HLD Medications?: No Check present secondary causes (below) that can lead to increased cholesterol levels (multi-choice optional): Diabetes We discussed: How to reduce cholesterol through diet/weight management and physical activity., How a diet high in plant sterols (fruits/vegetables/nuts/whole grains/legumes) may reduce your cholesterol., Encouraged increasing fiber to a daily intake of 10-25g/day Assessment:: Uncontrolled Drug: Atorvastatin 20mg  1 tablet daily Assessment: Appropriate, Effective, Safe, Accessible Drug: Fish Oil 1000mg  1 daily Assessment: Appropriate, Effective, Safe, Accessible Additional Info: Patient's last lipid panel on 10/04/20 is elevated. If new lipid panel continues to be elevated, may need to increase statin dose. Plan to: Continue current medication therapy. HC Follow up: 2 months Pharmacist Follow up: 09/25/21  Exercise, Diet and Non-Drug Coordination Needs Additional exercise counseling points. We discussed: targeting at least 151 minutes per week of moderate-intensity aerobic exercise. Additional diet counseling points. We discussed: key  components of the DASH diet Discussed Non-Drug Care Coordination Needs: Yes Does Patient have Medication financial barriers?: No  [facility] Health-Related Social Needs Screening Tool -  SDOH  What is your living situation today? (ref #1): I have a steady place to live Think about the place you live. Do you have problems with any of the following? (ref #2): None of the above Within the past 12 months, you worried that your food would run out before you got money to buy more (ref #3): Never true Within the past 12 months, the food you bought just didn't last and you didn't have money to get more (ref #4): Never true In the past 12 months, has lack of reliable transportation kept you from medical appointments, meetings, work or from getting things needed for daily living? (ref #5): No In the past 12 months, has the electric, gas, oil, or water company threatened to shut off services in your home? (ref #6): No How often does anyone, including family and friends, physically hurt you? (ref #7): Never (1) How often does anyone, including family and friends, insult or talk down to you? (ref #8): Never (1) How often does anyone, including friends and family, threaten you with harm? (ref #9): Never (1) How often does anyone, including family and friends, scream or curse at you? (ref #10): Never (1)  COMPREHENSIVE CARE PLAN AND GOALS:     HYPERTENSION   OFFICE VISIT BLOOD PRESSURE:   Unable to assess due to telephone visit.Marland Kitchen   HOME BLOOD PRESSURE MONITORING: Takes BP nightly but no log  MY GOAL BLOOD PRESSURE: <130/80 mmHg  CURRENT MEDICATION AND DOSING:  Amlodipine 10mg  - take 1 tablet by mouth daily  Lisinopril 30mg  - take 1 tab daily  BARRIERS TO ACHIEVING GOALS: Choose an item.   PLAN:  Choose an item.  CHOLESTEROL   MOST RECENT LABS: 10/04/20  TOTAL CHOLESTEROL:  239  TRIGLYCERIDES: 210  HDL: 58  LDL:  143  CURRENT MEDICATION AND DOSING:  Atorvastatin 20mg  - take  one tab daily  Fish Oil 1000mg  1 cap daily  THE GOALS WE HAVE CHOSEN ARE:  Total Cholesterol goal under 200, Triglycerides goal under 150, HDL goal above 50, LDL goal under 100    BARRIERS TO ACHIEVING GOALS: None identified. Need updated lipid panel  PLAN TO WORK ON THESE GOALS: Continue current medications as prescribed.       DIABETES  MOST RECENT A1C:  6.2 on 04/02/21  MY GOAL A1C: less than 7%  LAST FOOT EXAM: 01/01/21  LAST EYE EXAM: 09/03/20  CURRENT MEDICATION AND DOSING:  Levemir 100 units/mL - inject 44 units into the skin daily  Ozempic - inject 1mg  into the skin weekly  enter text here   THE GOALS WE HAVE CHOSEN ARE:   Maintain A1c < 7%  BARRIERS TO ACHIEVING GOALS: None identified, blood sugar and A1C is within goal   PLAN TO WORK ON THESE GOALS: Continue current medications as prescribed.       HEALTHY HABITS (Diet and exercise)  CURRENT DIET/EXERCISE: walks daily and watches salt  THE GOALS WE HAVE CHOSEN ARE:   Stay active, engage in at least 150 minutes per week of moderate-intensity exercise such as brisk walking (15- to 20-minute mile) or something similar. Increase flexibility exercises, break up prolonged periods of sitting Increase seafood, lean meats, whole grains, legumes, nuts, fruits (for dessert), and vegetables. Limit salt intake, sugars, carbs, and red meat, refined and processed foods.  BARRIERS TO ACHIEVING GOALS:  None  PLAN TO WORK ON THESE GOALS:  Continue current regimen    ACTIVE MEDICATION LIST  MEDICATION DOSE DIRECTIONS CONDITION NOTES  Allopurinol  100mg  1 tab by mouth at night Gout   Amlodipine 10mg  1 tab daily Blood pressure   Atorvastatin 20mg  1 tab daily Cholesterol   Brimonidine 0.2% 1 drop both eyes twice daily Eyes   Celecoxib 200mg  1 tab daily for one week then as needed Pain   Gabapentin 300mg  Once cap three times daily Pain   Levemir  100units/ml 44 units into the skin daily Diabetes   Lisinopril 30mg  One tab daily Blood pressure    Omega 3 Fish Oil 1000mg  1 tab daily Cholesterol   Ozempic 1mg  1mg  into skin once weekly Diabetes    MEDICATION REVIEW  MEDICATION REVIEW CONDUCTED:   Yes DATE:  05/08/2021 BEST POSSIBLE MEDICATION HISTORY SOURCE:   Medical Records  PHARMACY [employer] VIALS OR PACKS: Vials   ALLERGIES/INTOLERANCES   NAME OF MEDICATION  REACTION    No known drug allergies          CURRENT HEALTHCARE PROVIDER TEAM   PROVIDER/TEAM MEMBER  ROLE  PHONE NUMBER  COMMENTS    Clayborn Bigness Primary Care Provider  [phone]    [place], [place] Pharmacist [phone]              Fat and Cholesterol Restricted Eating Plan Getting too much fat and cholesterol in your diet may cause health problems. Choosing the right foods helps keep your fat and cholesterol at normal levels. This can keep you from getting certain diseases. Your doctor may recommend an eating plan that includes: Total fat: ______% or less of total calories a day. This is ______g of fat a day. Saturated fat: ______% or less of total calories a day. This is ______g  of saturated fat a day. Cholesterol: less than _________mg a day. Fiber: ______g a day. What are tips for following this plan? General tips Work with your doctor to lose weight if you need to. Avoid: Foods with added sugar. Fried foods. Foods with trans fat or partially hydrogenated oils. This includes some margarines and baked goods. If you drink alcohol: Limit how much you have to: 0-1 drink a day for women who are not pregnant. 0-2 drinks a day for men. Know how much alcohol is in a drink. In the [place] one drink equals one 12 oz bottle of beer (355 mL), one 5 oz glass of wine (148 mL), or one 1 oz glass of hard liquor (44 mL). Reading food labels Check food labels for: Trans fats. Partially hydrogenated oils. Saturated fat (g) in each serving. Cholesterol (mg) in each serving. Fiber (g) in each serving. Choose foods with healthy fats, such as: Monounsaturated  fats and polyunsaturated fats. These include olive and canola oil, flaxseeds, walnuts, almonds, and seeds. Omega-3 fats. These are found in certain fish, flaxseed oil, and ground flaxseeds. Choose grain products that have whole grains. Look for the word "whole" as the first word in the ingredient list. Cooking Cook foods using low-fat methods. These include baking, boiling, grilling, and broiling. Eat more home-cooked foods. Eat at restaurants and buffets less often. Eat less fast food. Avoid cooking using saturated fats, such as butter, cream, palm oil, palm kernel oil, and coconut oil. Meal planning A plate with examples of foods in a healthy diet.  At meals, divide your plate into four equal parts: Fill one-half of your plate with vegetables, green salads, and fruit. Fill one-fourth of your plate with whole grains. Fill one-fourth of your plate with low-fat (lean) protein foods. Eat fish that is high in omega-3 fats at least two times a week. This includes mackerel, tuna, sardines, and salmon. Eat foods that are high in fiber, such as whole grains, beans, apples, pears, berries, broccoli, carrots, peas, and barley. What foods should I eat? Fruits All fresh, canned (in natural juice), or frozen fruits. Vegetables Fresh or frozen vegetables (raw, steamed, roasted, or grilled). Green salads. Grains Whole grains, such as whole wheat or whole grain breads, crackers, cereals, and pasta. Unsweetened oatmeal, bulgur, barley, quinoa, or brown rice. Corn or whole wheat flour tortillas. Meats and other protein foods Ground beef (85% or leaner), grass-fed beef, or beef trimmed of fat. Skinless chicken or [place]. Ground chicken or [place]. Pork trimmed of fat. All fish and seafood. Egg whites. Dried beans, peas, or lentils. Unsalted nuts or seeds. Unsalted canned beans. Nut butters without added sugar or oil. Dairy Low-fat or nonfat dairy products, such as skim or 1% milk, 2% or reduced-fat cheeses,  low-fat and fat-free ricotta or cottage cheese, or plain low-fat and nonfat yogurt. Fats and oils Tub margarine without trans fats. Light or reduced-fat mayonnaise and salad dressings. Avocado. Olive, canola, sesame, or safflower oils. The items listed above may not be a complete list of foods and beverages you can eat. Contact a dietitian for more information. What foods should I avoid? Fruits Canned fruit in heavy syrup. Fruit in cream or butter sauce. Fried fruit. Vegetables Vegetables cooked in cheese, cream, or butter sauce. Fried vegetables. Grains White bread. White pasta. White rice. Cornbread. Bagels, pastries, and croissants. Crackers and snack foods that contain trans fat and hydrogenated oils. Meats and other protein foods Fatty cuts of meat. Ribs, chicken wings, bacon, sausage, bologna, salami, chitterlings,  fatback, hot dogs, bratwurst, and packaged lunch meats. Liver and organ meats. Whole eggs and egg yolks. Chicken and [place] with skin. Fried meat. Dairy Whole or 2% milk, cream, half-and-half, and cream cheese. Whole milk cheeses. Whole-fat or sweetened yogurt. Full-fat cheeses. Nondairy creamers and whipped toppings. Processed cheese, cheese spreads, and cheese curds. Fats and oils Butter, stick margarine, lard, shortening, ghee, or bacon fat. Coconut, palm kernel, and palm oils. Beverages Alcohol. Sugar-sweetened drinks such as sodas, lemonade, and fruit drinks. Sweets and desserts Corn syrup, sugars, honey, and molasses. Candy. Jam and jelly. Syrup. Sweetened cereals. Cookies, pies, cakes, donuts, muffins, and ice cream. The items listed above may not be a complete list of foods and beverages you should avoid. Contact a dietitian for more information. Summary Choosing the right foods helps keep your fat and cholesterol at normal levels. This can keep you from getting certain diseases. At meals, fill one-half of your plate with vegetables, green salads, and fruits. Eat  high fiber foods, like whole grains, beans, apples, pears, berries, carrots, peas, and barley. Limit added sugar, saturated fats, alcohol, and fried foods. This information is not intended to replace advice given to you by your health care provider. Make sure you discuss any questions you have with your health care provider.  Alena Bills Clinical Pharmacist [phone]

## 2021-05-09 ENCOUNTER — Telehealth: Payer: Self-pay | Admitting: Student-PharmD

## 2021-05-09 NOTE — Progress Notes (Signed)
  Chronic Care Management Pharmacy Assistant   Name: Dawn Alvarez  MRN: [id number] DOB: 27-Oct-1955  Reason for Encounter: CCM Care plan  Medications: Outpatient Encounter Medications as of 05/09/2021  Medication Sig Note   Alcohol Swabs (B-D SINGLE USE SWABS REGULAR) PADS Use as directed twice a day    allopurinol (ZYLOPRIM) 100 MG tablet Take 1 tablet (100 mg total) by mouth at bedtime. Take 1 tablet by mouth at night for Gout    amLODipine (NORVASC) 10 MG tablet Take 1 tablet (10 mg total) by mouth daily. Take 1/2 tablet po QD    atorvastatin (LIPITOR) 20 MG tablet Take 1 tablet (20 mg total) by mouth daily.    Blood Glucose Monitoring Suppl (TRUE METRIX AIR GLUCOSE METER) w/Device KIT 1 Device by Does not apply route in the morning and at bedtime. Use ad directed e11.65    brimonidine (ALPHAGAN) 0.2 % ophthalmic solution Place 1 drop into both eyes 2 (two) times daily.    Calcium Carb-Cholecalciferol (CALCIUM 600 + D) 600-200 MG-UNIT TABS Take 1 tablet by mouth daily.    celecoxib (CELEBREX) 200 MG capsule Take one tab po qd for one week and then as needed    gabapentin (NEURONTIN) 300 MG capsule Take 1 capsule (300 mg total) by mouth 3 (three) times daily.    glucose blood (TRUE METRIX BLOOD GLUCOSE TEST) test strip Use as instructed twice a daily diag e11.65    HYDROcodone-acetaminophen (NORCO/VICODIN) 5-325 MG tablet Take 1-2 tablets by mouth every 6 (six) hours as needed for moderate pain or severe pain.    insulin detemir (LEVEMIR FLEXTOUCH) 100 UNIT/ML FlexPen Inject 44 Units into the skin daily. ALONG WITH NEEDLES 10/24/2020: Patient taking 30 units    Insulin Pen Needle (PEN NEEDLES) 31G X 8 MM MISC Use as directed with insulin E11.65    Lancets Misc. (ACCU-CHEK MULTICLIX LANCET DEV) KIT by Does not apply route. Use as directed twice a day diag E11.65    lidocaine-prilocaine (EMLA) cream SMARTSIG:1 Topical Every Night    lisinopril (ZESTRIL) 30 MG tablet Take one tab po qd for  HTN    Omega-3 Fatty Acids (FISH OIL) 1000 MG CAPS Take 1,000 mg by mouth daily.     Semaglutide, 1 MG/DOSE, (OZEMPIC, 1 MG/DOSE,) 2 MG/1.5ML SOPN Inject 1 mg into the skin once a week. FOR DM    SIMBRINZA 1-0.2 % SUSP     TRUEplus Lancets 30G MISC Use as directed twice daily diag e11.65    Facility-Administered Encounter Medications as of 05/09/2021  Medication   heparin lock flush 100 UNIT/ML injection   sodium chloride flush (NS) 0.9 % injection 10 mL   Reviewed the patients initial visit reinsured it was completed per the pharmacist Alena Bills request. Printed the CCM care plan. Mailed the patient CCM care plan to their most recent address on file.  Follow-Up:Pharmacist Review  [name], [place] Pharmacist Assistant [phone]

## 2021-05-20 ENCOUNTER — Ambulatory Visit (INDEPENDENT_AMBULATORY_CARE_PROVIDER_SITE_OTHER): Payer: Medicare Other | Admitting: Nurse Practitioner

## 2021-05-20 ENCOUNTER — Other Ambulatory Visit: Payer: Self-pay

## 2021-05-20 ENCOUNTER — Encounter: Payer: Self-pay | Admitting: Nurse Practitioner

## 2021-05-20 VITALS — BP 134/74 | HR 81 | Temp 98.0°F | Resp 16 | Ht 60.0 in | Wt 168.0 lb

## 2021-05-20 DIAGNOSIS — I10 Essential (primary) hypertension: Secondary | ICD-10-CM | POA: Diagnosis not present

## 2021-05-20 DIAGNOSIS — M5442 Lumbago with sciatica, left side: Secondary | ICD-10-CM

## 2021-05-20 DIAGNOSIS — M10072 Idiopathic gout, left ankle and foot: Secondary | ICD-10-CM

## 2021-05-20 MED ORDER — CYCLOBENZAPRINE HCL 10 MG PO TABS: 10.0000 mg | ORAL_TABLET | Freq: Every evening | ORAL | 0 refills | Status: DC | PRN

## 2021-05-20 MED ORDER — COLCHICINE 0.6 MG PO TABS: ORAL_TABLET | ORAL | 2 refills | Status: DC

## 2021-05-20 MED ORDER — HYDROCODONE-ACETAMINOPHEN 5-325 MG PO TABS: 1.0000 | ORAL_TABLET | ORAL | 0 refills | Status: DC | PRN

## 2021-05-20 NOTE — Progress Notes (Signed)
[facility] [place], [place] [address]  Internal MEDICINE  Office Visit Note  Patient Name: Dawn Alvarez  [id number]  [id number]  Date of Service: 05/20/2021  Chief Complaint  Patient presents with   Follow-up    Pain in back on left side from shoulder to mid back, pain in left leg, started last week   Diabetes   Hyperlipidemia   Hypertension   COPD   Asthma    HPI Dawn Alvarez presents for a follow up visit for low back pain. The pain started last week. She has not had any injury that she knows of. She has not had any falls. The low back pain radiates up her back to her shoulder on the left side. The pain also radiates down her leg on the left side and is sharp and shooting. The pain is constant and often a 10 out of 10 n the pain scale. She has not had any pain like this before. She has been taking OTC tylenol which has provided minimal relief.  Her blood pressure was significantly elevated at her previous visit and her amlodipine dose was increased from 5 mg to 10 mg daily. Her blood pressure is significantly improved today.     Current Medication: Outpatient Encounter Medications as of 05/20/2021  Medication Sig Note   Alcohol Swabs (B-D SINGLE USE SWABS REGULAR) PADS Use as directed twice a day    allopurinol (ZYLOPRIM) 100 MG tablet Take 1 tablet (100 mg total) by mouth at bedtime. Take 1 tablet by mouth at night for Gout    amLODipine (NORVASC) 10 MG tablet Take 1 tablet (10 mg total) by mouth daily. Take 1/2 tablet po QD    atorvastatin (LIPITOR) 20 MG tablet Take 1 tablet (20 mg total) by mouth daily.    Blood Glucose Monitoring Suppl (TRUE METRIX AIR GLUCOSE METER) w/Device KIT 1 Device by Does not apply route in the morning and at bedtime. Use ad directed e11.65    brimonidine (ALPHAGAN) 0.2 % ophthalmic solution Place 1 drop into both eyes 2 (two) times daily.    Calcium Carb-Cholecalciferol (CALCIUM 600 + D) 600-200 MG-UNIT TABS Take 1 tablet  by mouth daily.    celecoxib (CELEBREX) 200 MG capsule Take one tab po qd for one week and then as needed    colchicine 0.6 MG tablet For a gout flare/attack: take 2 tablets by mouth then take 1 more tablet 1 hour later on day 1, then take 1 tablet by mouth daily until it resolves.    cyclobenzaprine (FLEXERIL) 10 MG tablet Take 1 tablet (10 mg total) by mouth at bedtime as needed for muscle spasms.    gabapentin (NEURONTIN) 300 MG capsule Take 1 capsule (300 mg total) by mouth 3 (three) times daily.    glucose blood (TRUE METRIX BLOOD GLUCOSE TEST) test strip Use as instructed twice a daily diag e11.65    insulin detemir (LEVEMIR FLEXTOUCH) 100 UNIT/ML FlexPen Inject 44 Units into the skin daily. ALONG WITH NEEDLES 10/24/2020: Patient taking 30 units    Insulin Pen Needle (PEN NEEDLES) 31G X 8 MM MISC Use as directed with insulin E11.65    Lancets Misc. (ACCU-CHEK MULTICLIX LANCET DEV) KIT by Does not apply route. Use as directed twice a day diag E11.65    lidocaine-prilocaine (EMLA) cream SMARTSIG:1 Topical Every Night    lisinopril (ZESTRIL) 30 MG tablet Take one tab po qd for HTN    Omega-3 Fatty Acids (FISH OIL) 1000 MG  CAPS Take 1,000 mg by mouth daily.     Semaglutide, 1 MG/DOSE, (OZEMPIC, 1 MG/DOSE,) 2 MG/1.5ML SOPN Inject 1 mg into the skin once a week. FOR DM    SIMBRINZA 1-0.2 % SUSP     TRUEplus Lancets 30G MISC Use as directed twice daily diag e11.65    [DISCONTINUED] HYDROcodone-acetaminophen (NORCO/VICODIN) 5-325 MG tablet Take 1-2 tablets by mouth every 6 (six) hours as needed for moderate pain or severe pain.    HYDROcodone-acetaminophen (NORCO/VICODIN) 5-325 MG tablet Take 1-2 tablets by mouth every 4 (four) hours as needed for moderate pain or severe pain.    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   BREAST LUMPECTOMY WITH RADIOACTIVE SEED AND SENTINEL LYMPH NODE BIOPSY Right 05/04/2020   Procedure: RIGHT BREAST LUMPECTOMY WITH BRACKETED RADIOACTIVE SEED AND SENTINEL LYMPH NODE BIOPSY;  Surgeon: Jovita Kussmaul, MD;  Location: [facility];  Service: General;  Laterality: Right;   CESAREAN SECTION     COLONOSCOPY WITH PROPOFOL N/A 09/11/2015   Procedure: COLONOSCOPY WITH PROPOFOL;  Surgeon: Lucilla Lame, MD;  Location: [facility];  Service: Endoscopy;  Laterality: N/A;   IR IMAGING GUIDED PORT INSERTION  07/20/2019    Medical History: Past Medical History:  Diagnosis Date   Asthma    Cancer ([place]) 03/2020   right breast IMC   COPD exacerbation ([place]) 04/12/2016   Diabetes mellitus without complication (HCC)    Hyperlipemia    Hypertension    Personal history of chemotherapy 2021    Family History: Family History  Problem Relation Age of Onset   Diabetes Mother    Hypertension Mother    Hypertension Father    Diabetes Father    Breast cancer Neg Hx     Social History   Socioeconomic History   Marital status: Married    Spouse name: Not on file   Number of children: Not on file   Years of education: Not on file   Highest education level: Not on file  Occupational History   Not on file  Tobacco Use   Smoking status: Former    Packs/day: 1.00    Years: 2.00    Pack years: 2.00    Types: Cigarettes    Quit date: 02/29/2020    Years since quitting: 1.2   Smokeless tobacco: Never  Substance and Sexual Activity   Alcohol use: Yes    Comment: social   Drug use: No   Sexual activity: Not  Currently    Birth control/protection: Post-menopausal  Other Topics Concern   Not on file  Social History Narrative   Not on file   Social Determinants of Health   Financial Resource Strain: Low Risk    Difficulty of Paying Living Expenses: Not very hard  Food Insecurity: Not on file  Transportation Needs: Not on file  Physical Activity: Not on file  Stress: Not on file  Social Connections: Not on file  Intimate Partner Violence:  Not on file  ° ° ° ° °Review of Systems  °Constitutional:  Negative for chills, fatigue and unexpected weight change.  °HENT:  Negative for congestion, rhinorrhea, sneezing and sore throat.   °Eyes:  Negative for redness.  °Respiratory:  Negative for cough, chest tightness and shortness of breath.   °Cardiovascular:  Negative for chest pain and palpitations.  °Gastrointestinal:  Negative for abdominal pain, constipation, diarrhea, nausea and vomiting.  °Genitourinary:  Negative for dysuria and frequency.  °Musculoskeletal:  Negative for arthralgias, back pain, joint swelling and neck pain.  °Skin:  Negative for rash.  °Neurological: Negative.  Negative for tremors and numbness.  °Hematological:  Negative for adenopathy. Does not bruise/bleed easily.  °Psychiatric/Behavioral:  Negative for behavioral problems (Depression), sleep disturbance and suicidal ideas. The patient is not nervous/anxious.   ° °Vital Signs: °BP 134/74    Pulse 81    Temp 98 °F (36.7 °C)    Resp 16    Ht 5' (1.524 m)    Wt 168 lb (76.2 kg)    SpO2 99%    BMI 32.81 kg/m²  ° ° °Physical Exam °Vitals reviewed.  °Constitutional:   °   General: She is not in acute distress. °   Appearance: Normal appearance. She is obese. She is not ill-appearing.  °HENT:  °   Head: Normocephalic and atraumatic.  °Eyes:  °   Pupils: Pupils are equal, round, and reactive to light.  °Cardiovascular:  °   Rate and Rhythm: Normal rate and regular rhythm.  °Pulmonary:  °   Effort: Pulmonary effort is normal. No respiratory  distress.  °Neurological:  °   Mental Status: She is alert and oriented to person, place, and time.  °   Cranial Nerves: No cranial nerve deficit.  °   Coordination: Coordination normal.  °   Gait: Gait normal.  °Psychiatric:     °   Mood and Affect: Mood normal.     °   Behavior: Behavior normal.  ° ° ° ° ° °Assessment/Plan: °1. Acute left-sided low back pain with left-sided sciatica °Encouraged patient to use nonpharmacologic interventions including applying ice to decrease inflammation. Handout with back stretches provided, may apply heat prior to stretching. May continue OTC tylenol as needed. For severe pain, may take norco as prescribed. May take flexeril for muscle spasms at bedtime as needed. Follow up in 1 month to reassess back pain. If no improvement, will consider imaging and/or orthopedic referral.  °- HYDROcodone-acetaminophen (NORCO/VICODIN) 5-325 MG tablet; Take 1-2 tablets by mouth every 4 (four) hours as needed for moderate pain or severe pain, limit of 10 tablets per day.  Dispense: 30 tablet; Refill: 0 °- cyclobenzaprine (FLEXERIL) 10 MG tablet; Take 1 tablet (10 mg total) by mouth at bedtime as needed for muscle spasms.  Dispense: 15 tablet; Refill: 0 ° °2. Acute idiopathic gout of left foot °Patient is already on daily allopurinol. Colchicine prescribed for acute gout attacks. Explained process of taking colchicine for acute gout flare. Patient acknowledges and is agreeable to plan.  °- colchicine 0.6 MG tablet; For a gout flare/attack: take 2 tablets by mouth then take 1 more tablet 1 hour later on day 1, then take 1 tablet by mouth daily until it resolves.  Dispense: 60 tablet; Refill: 2 ° °3. Essential hypertension °Blood pressure is significantly improved with increased dose of amlodipine. Continue as prescribed.  ° ° °General Counseling: Bryla verbalizes understanding of the findings of todays visit and agrees with plan   of treatment. I have discussed any further diagnostic evaluation that  may be needed or ordered today. We also reviewed her medications today. she has been encouraged to call the office with any questions or concerns that should arise related to todays visit.    No orders of the defined types were placed in this encounter.   Meds ordered this encounter  Medications   colchicine 0.6 MG tablet    Sig: For a gout flare/attack: take 2 tablets by mouth then take 1 more tablet 1 hour later on day 1, then take 1 tablet by mouth daily until it resolves.    Dispense:  60 tablet    Refill:  2   HYDROcodone-acetaminophen (NORCO/VICODIN) 5-325 MG tablet    Sig: Take 1-2 tablets by mouth every 4 (four) hours as needed for moderate pain or severe pain.    Dispense:  30 tablet    Refill:  0   cyclobenzaprine (FLEXERIL) 10 MG tablet    Sig: Take 1 tablet (10 mg total) by mouth at bedtime as needed for muscle spasms.    Dispense:  15 tablet    Refill:  0    Return in about 1 month (around 06/20/2021) for F/U, Darly Massi PCP.   Total time spent:30 Minutes Time spent includes review of chart, medications, test results, and follow up plan with the patient.   [place] Controlled Substance Database was reviewed by me.  This patient was seen by [name], FNP-C in collaboration with Dr. Clayborn Bigness as a part of collaborative care agreement.   Lajune Perine R. Valetta Fuller, MSN, FNP-C Internal medicine

## 2021-05-20 NOTE — Patient Instructions (Signed)
Please read information about stretches for back that can help alleviate pain May apply ice to back to decrease pain and inflammation. Apply heat to back prior to stretching if possible May continue OTC tylenol as needed Please take cyclobenzaprine as needed.

## 2021-05-22 ENCOUNTER — Telehealth: Payer: Self-pay

## 2021-05-22 NOTE — Telephone Encounter (Signed)
Pt LMOM after hours stating that she couldn't make a bowel movement, per Alyssa pt can take OTC Senokot-S or just colace.  Pt understood

## 2021-05-28 ENCOUNTER — Encounter: Payer: Self-pay | Admitting: Oncology

## 2021-05-31 DIAGNOSIS — I10 Essential (primary) hypertension: Secondary | ICD-10-CM

## 2021-05-31 DIAGNOSIS — E1165 Type 2 diabetes mellitus with hyperglycemia: Secondary | ICD-10-CM

## 2021-05-31 DIAGNOSIS — J441 Chronic obstructive pulmonary disease with (acute) exacerbation: Secondary | ICD-10-CM

## 2021-06-04 ENCOUNTER — Encounter: Payer: Self-pay | Admitting: Oncology

## 2021-06-05 ENCOUNTER — Other Ambulatory Visit: Payer: Self-pay

## 2021-06-05 ENCOUNTER — Inpatient Hospital Stay: Payer: Medicaid Other | Attending: Oncology

## 2021-06-05 DIAGNOSIS — C50411 Malignant neoplasm of upper-outer quadrant of right female breast: Secondary | ICD-10-CM | POA: Insufficient documentation

## 2021-06-05 DIAGNOSIS — Z171 Estrogen receptor negative status [ER-]: Secondary | ICD-10-CM | POA: Diagnosis not present

## 2021-06-05 DIAGNOSIS — Z95828 Presence of other vascular implants and grafts: Secondary | ICD-10-CM

## 2021-06-05 DIAGNOSIS — Z452 Encounter for adjustment and management of vascular access device: Secondary | ICD-10-CM | POA: Insufficient documentation

## 2021-06-05 MED ORDER — HEPARIN SOD (PORK) LOCK FLUSH 100 UNIT/ML IV SOLN
INTRAVENOUS | Status: AC
  Administered 2021-06-05: 500 [IU] via INTRAVENOUS
  Filled 2021-06-05: qty 5

## 2021-06-05 MED ORDER — SODIUM CHLORIDE 0.9% FLUSH
10.0000 mL | Freq: Once | INTRAVENOUS | Status: AC
  Administered 2021-06-05: 10 mL via INTRAVENOUS
  Filled 2021-06-05: qty 10

## 2021-06-05 MED ORDER — HEPARIN SOD (PORK) LOCK FLUSH 100 UNIT/ML IV SOLN
500.0000 [IU] | Freq: Once | INTRAVENOUS | Status: AC
  Filled 2021-06-05: qty 5

## 2021-06-07 ENCOUNTER — Encounter: Payer: Self-pay | Admitting: Oncology

## 2021-06-13 ENCOUNTER — Encounter: Payer: Self-pay | Admitting: Oncology

## 2021-06-19 DIAGNOSIS — Z0289 Encounter for other administrative examinations: Secondary | ICD-10-CM

## 2021-06-20 ENCOUNTER — Ambulatory Visit: Payer: Medicaid Other | Admitting: Nurse Practitioner

## 2021-06-25 ENCOUNTER — Ambulatory Visit: Payer: Medicare Other | Admitting: Nurse Practitioner

## 2021-06-25 ENCOUNTER — Other Ambulatory Visit: Payer: Self-pay

## 2021-06-25 ENCOUNTER — Encounter: Payer: Self-pay | Admitting: Nurse Practitioner

## 2021-06-25 VITALS — BP 138/80 | HR 69 | Temp 97.8°F | Resp 16 | Ht 61.0 in | Wt 170.0 lb

## 2021-06-25 DIAGNOSIS — M5441 Lumbago with sciatica, right side: Secondary | ICD-10-CM

## 2021-06-25 DIAGNOSIS — I10 Essential (primary) hypertension: Secondary | ICD-10-CM | POA: Diagnosis not present

## 2021-06-25 DIAGNOSIS — Z794 Long term (current) use of insulin: Secondary | ICD-10-CM

## 2021-06-25 DIAGNOSIS — E1165 Type 2 diabetes mellitus with hyperglycemia: Secondary | ICD-10-CM

## 2021-06-25 DIAGNOSIS — Z23 Encounter for immunization: Secondary | ICD-10-CM

## 2021-06-25 DIAGNOSIS — M159 Polyosteoarthritis, unspecified: Secondary | ICD-10-CM

## 2021-06-25 LAB — POCT GLYCOSYLATED HEMOGLOBIN (HGB A1C): Hemoglobin A1C: 6.9 % — AB (ref 4.0–5.6)

## 2021-06-25 MED ORDER — AMLODIPINE BESYLATE 10 MG PO TABS: 10.0000 mg | ORAL_TABLET | Freq: Every day | ORAL | 1 refills | Status: DC

## 2021-06-25 MED ORDER — HYDROCODONE-ACETAMINOPHEN 5-325 MG PO TABS: 1.0000 | ORAL_TABLET | Freq: Four times a day (QID) | ORAL | 0 refills | Status: DC | PRN

## 2021-06-25 MED ORDER — LEVEMIR FLEXTOUCH 100 UNIT/ML ~~LOC~~ SOPN: 44.0000 [IU] | PEN_INJECTOR | Freq: Every day | SUBCUTANEOUS | 3 refills | Status: DC

## 2021-06-25 MED ORDER — ZOSTER VAC RECOMB ADJUVANTED 50 MCG/0.5ML IM SUSR: 0.5000 mL | Freq: Once | INTRAMUSCULAR | 0 refills | Status: AC

## 2021-06-25 MED ORDER — CELECOXIB 200 MG PO CAPS: ORAL_CAPSULE | ORAL | 1 refills | Status: DC

## 2021-06-25 MED ORDER — PNEUMOCOCCAL 20-VAL CONJ VACC 0.5 ML IM SUSY: 0.5000 mL | PREFILLED_SYRINGE | INTRAMUSCULAR | 0 refills | Status: AC

## 2021-06-25 MED ORDER — HYDROCODONE-ACETAMINOPHEN 5-325 MG PO TABS: 1.0000 | ORAL_TABLET | ORAL | 0 refills | Status: DC | PRN

## 2021-06-25 NOTE — Progress Notes (Signed)
[facility] °[address] °[place], [place] [address] ° °Internal MEDICINE  °Office Visit Note ° °Patient Name: Dawn Alvarez ° 06/22/1955  °[id number] ° °Date of Service: 06/25/2021 ° °Chief Complaint  °Patient presents with  ° Follow-up  °  Right side upper back pain, started recently off and on   ° Diabetes  ° Hyperlipidemia  ° Hypertension  ° COPD  ° ° °HPI °Dawn Alvarez presents for follow-up visit for diabetes, hypertension and back pain.  She is accompanied by her husband today.  Her A1c is 6.9 today which is elevated from previous A1c of 6.2.  She is working on diet modifications.  She is due for medication refills.  Her blood pressure is stable.  She is reporting right sided back pain off and on.  She reports that she takes Tylenol sometimes. ° ° °Current Medication: °Outpatient Encounter Medications as of 06/25/2021  °Medication Sig  ° Alcohol Swabs (B-D SINGLE USE SWABS REGULAR) PADS Use as directed twice a day  ° allopurinol (ZYLOPRIM) 100 MG tablet Take 1 tablet (100 mg total) by mouth at bedtime. Take 1 tablet by mouth at night for Gout  ° atorvastatin (LIPITOR) 20 MG tablet Take 1 tablet (20 mg total) by mouth daily.  ° Blood Glucose Monitoring Suppl (TRUE METRIX AIR GLUCOSE METER) w/Device KIT 1 Device by Does not apply route in the morning and at bedtime. Use ad directed e11.65  ° brimonidine (ALPHAGAN) 0.2 % ophthalmic solution Place 1 drop into both eyes 2 (two) times daily.  ° Calcium Carb-Cholecalciferol (CALCIUM 600 + D) 600-200 MG-UNIT TABS Take 1 tablet by mouth daily.  ° colchicine 0.6 MG tablet For a gout flare/attack: take 2 tablets by mouth then take 1 more tablet 1 hour later on day 1, then take 1 tablet by mouth daily until it resolves.  ° cyclobenzaprine (FLEXERIL) 10 MG tablet Take 1 tablet (10 mg total) by mouth at bedtime as needed for muscle spasms.  ° gabapentin (NEURONTIN) 300 MG capsule Take 1 capsule (300 mg total) by mouth 3 (three) times daily.  ° glucose blood (TRUE  METRIX BLOOD GLUCOSE TEST) test strip Use as instructed twice a daily diag e11.65  ° Insulin Pen Needle (PEN NEEDLES) 31G X 8 MM MISC Use as directed with insulin E11.65  ° Lancets Misc. (ACCU-CHEK MULTICLIX LANCET DEV) KIT by Does not apply route. Use as directed twice a day diag E11.65  ° lidocaine-prilocaine (EMLA) cream SMARTSIG:1 Topical Every Night  ° lisinopril (ZESTRIL) 30 MG tablet Take one tab po qd for HTN  ° Omega-3 Fatty Acids (FISH OIL) 1000 MG CAPS Take 1,000 mg by mouth daily.   ° Semaglutide, 1 MG/DOSE, (OZEMPIC, 1 MG/DOSE,) 2 MG/1.5ML SOPN Inject 1 mg into the skin once a week. FOR DM  ° SIMBRINZA 1-0.2 % SUSP   ° TRUEplus Lancets 30G MISC Use as directed twice daily diag e11.65  ° [DISCONTINUED] amLODipine (NORVASC) 10 MG tablet Take 1 tablet (10 mg total) by mouth daily. Take 1/2 tablet po QD  ° [DISCONTINUED] celecoxib (CELEBREX) 200 MG capsule Take one tab po qd for one week and then as needed  ° [DISCONTINUED] HYDROcodone-acetaminophen (NORCO/VICODIN) 5-325 MG tablet Take 1-2 tablets by mouth every 4 (four) hours as needed for moderate pain or severe pain.  ° [DISCONTINUED] insulin detemir (LEVEMIR FLEXTOUCH) 100 UNIT/ML FlexPen Inject 44 Units into the skin daily. ALONG WITH NEEDLES  ° [DISCONTINUED] pneumococcal 20-valent conjugate vaccine (PREVNAR 20) 0.5 ML injection Inject into   the muscle tomorrow at 10 am.   [DISCONTINUED] Zoster Vaccine Adjuvanted [facility]) injection Inject 0.5 mLs into the muscle once.   amLODipine (NORVASC) 10 MG tablet Take 1 tablet (10 mg total) by mouth daily.   celecoxib (CELEBREX) 200 MG capsule Take one tab po qd for one week and then as needed   HYDROcodone-acetaminophen (NORCO/VICODIN) 5-325 MG tablet Take 1-2 tablets by mouth every 4 (four) hours as needed for moderate pain or severe pain.   insulin detemir (LEVEMIR FLEXTOUCH) 100 UNIT/ML FlexPen Inject 44 Units into the skin daily. ALONG WITH NEEDLES   pneumococcal 20-valent conjugate vaccine (PREVNAR  20) 0.5 ML injection Inject 0.5 mLs into the muscle tomorrow at 10 am for 1 dose.   Zoster Vaccine Adjuvanted [facility]) injection Inject 0.5 mLs into the muscle once for 1 dose.   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   BREAST LUMPECTOMY WITH RADIOACTIVE SEED AND SENTINEL LYMPH NODE BIOPSY Right 05/04/2020   Procedure: RIGHT BREAST LUMPECTOMY WITH BRACKETED RADIOACTIVE SEED AND SENTINEL LYMPH NODE BIOPSY;  Surgeon: Jovita Kussmaul, MD;  Location: [facility];  Service: General;  Laterality: Right;   CESAREAN SECTION     COLONOSCOPY WITH PROPOFOL N/A 09/11/2015   Procedure: COLONOSCOPY WITH PROPOFOL;  Surgeon: Lucilla Lame, MD;  Location: [facility];  Service: Endoscopy;  Laterality: N/A;   IR IMAGING GUIDED PORT INSERTION  07/20/2019    Medical History: Past Medical History:  Diagnosis Date   Asthma    Cancer ([facility]) 03/2020   right breast IMC   COPD exacerbation ([facility]) 04/12/2016   Diabetes mellitus without complication (HCC)    Hyperlipemia    Hypertension    Personal history of chemotherapy 2021    Family History: Family History  Problem Relation Age of Onset   Diabetes Mother    Hypertension Mother    Hypertension Father    Diabetes Father    Breast cancer Neg Hx     Social History   Socioeconomic History   Marital status: Married    Spouse name: Not on file   Number of children: Not on file   Years of education: Not  on file   Highest education level: Not on file  Occupational History   Not on file  Tobacco Use   Smoking status: Former    Packs/day: 1.00    Years: 2.00    Pack years: 2.00    Types: Cigarettes    Quit date: 02/29/2020    Years since quitting: 1.3   Smokeless tobacco: Never  Substance and Sexual Activity   Alcohol use: Yes    Comment: social   Drug use: No   Sexual activity: Not Currently    Birth control/protection: Post-menopausal  Other Topics Concern   Not on file  Social History Narrative   Not on file   Social Determinants of Health   Financial Resource Strain: Low Risk    Difficulty of Paying Living Expenses: Not  very hard  Food Insecurity: Not on file  Transportation Needs: Not on file  Physical Activity: Not on file  Stress: Not on file  Social Connections: Not on file  Intimate Partner Violence: Not on file      Review of Systems  Constitutional:  Negative for chills, fatigue and unexpected weight change.  HENT:  Negative for congestion, rhinorrhea, sneezing and sore throat.   Eyes:  Negative for redness.  Respiratory:  Negative for cough, chest tightness and shortness of breath.   Cardiovascular:  Negative for chest pain and palpitations.  Gastrointestinal:  Negative for abdominal pain, constipation, diarrhea, nausea and vomiting.  Genitourinary:  Negative for dysuria and frequency.  Musculoskeletal:  Positive for arthralgias and back pain. Negative for joint swelling and neck pain.  Skin:  Negative for rash.  Neurological: Negative.  Negative for tremors and numbness.  Hematological:  Negative for adenopathy. Does not bruise/bleed easily.  Psychiatric/Behavioral:  Negative for behavioral problems (Depression), sleep disturbance and suicidal ideas. The patient is not nervous/anxious.    Vital Signs: BP 138/80    Pulse 69    Temp 97.8 F (36.6 C)    Resp 16    Ht 5' 1" (1.549 m)    Wt 170 lb (77.1 kg)    SpO2 99%    BMI 32.12 kg/m    Physical  Exam Vitals reviewed.  Constitutional:      General: She is not in acute distress.    Appearance: Normal appearance. She is obese. She is not ill-appearing.  HENT:     Head: Normocephalic and atraumatic.  Eyes:     Pupils: Pupils are equal, round, and reactive to light.  Cardiovascular:     Rate and Rhythm: Normal rate and regular rhythm.  Pulmonary:     Effort: Pulmonary effort is normal. No respiratory distress.  Neurological:     Mental Status: She is alert and oriented to person, place, and time.     Cranial Nerves: No cranial nerve deficit.     Coordination: Coordination normal.     Gait: Gait normal.  Psychiatric:        Mood and Affect: Mood normal.        Behavior: Behavior normal.       Assessment/Plan: 1. Type 2 diabetes mellitus with hyperglycemia, with long-term current use of insulin (HCC) A1C is slightly elevated at 6.9,  still under 7 which is the goal. Continue current medications and doses, no changes. Discussed diet modifications and lifestyle modifications. Will repeat A1C in 3 months and if no improvement, will adjustment medication then.  - POCT HgB A1C - insulin detemir (LEVEMIR FLEXTOUCH) 100 UNIT/ML FlexPen; Inject 44 Units into the skin daily. ALONG WITH NEEDLES  Dispense: 15 mL; Refill: 3  2. Essential hypertension Stable, continue medication as prescribed.  - amLODipine (NORVASC) 10 MG tablet; Take 1 tablet (10 mg total) by mouth daily.  Dispense: 90 tablet; Refill: 1  3. Primary osteoarthritis involving multiple joints Celebrex refilled.  - celecoxib (CELEBREX) 200 MG capsule; Take one tab po qd for one week and then as needed  Dispense: 90 capsule; Refill: 1  4. Acute right-sided low back pain with right-sided sciatica Old order for norco reordered, but found out patient has never filled a prescription for norco when ordered. Initial prescription resent for 5 day script.  - HYDROcodone-acetaminophen (NORCO/VICODIN) 5-325 MG tablet; Take 1 tablet  by mouth every 6 (six) hours as needed for moderate pain or severe pain.  Dispense:  20 tablet; Refill: 0  5. Need for vaccination - Zoster Vaccine Adjuvanted [facility]) injection; Inject 0.5 mLs into the muscle once for 1 dose.  Dispense: 0.5 mL; Refill: 0 - pneumococcal 20-valent conjugate vaccine (PREVNAR 20) 0.5 ML injection; Inject 0.5 mLs into the muscle tomorrow at 10 am for 1 dose.  Dispense: 0.5 mL; Refill: 0   General Counseling: Arrabella verbalizes understanding of the findings of todays visit and agrees with plan of treatment. I have discussed any further diagnostic evaluation that may be needed or ordered today. We also reviewed her medications today. she has been encouraged to call the office with any questions or concerns that should arise related to todays visit.    Orders Placed This Encounter  Procedures   POCT HgB A1C    Meds ordered this encounter  Medications   Zoster Vaccine Adjuvanted [facility]) injection    Sig: Inject 0.5 mLs into the muscle once for 1 dose.    Dispense:  0.5 mL    Refill:  0   pneumococcal 20-valent conjugate vaccine (PREVNAR 20) 0.5 ML injection    Sig: Inject 0.5 mLs into the muscle tomorrow at 10 am for 1 dose.    Dispense:  0.5 mL    Refill:  0   celecoxib (CELEBREX) 200 MG capsule    Sig: Take one tab po qd for one week and then as needed    Dispense:  90 capsule    Refill:  1   amLODipine (NORVASC) 10 MG tablet    Sig: Take 1 tablet (10 mg total) by mouth daily.    Dispense:  90 tablet    Refill:  1    Please note change in dose, patient is taking 1 whole tablet now instead of a half tablet. Please discontinue all previous amlodipine orders   insulin detemir (LEVEMIR FLEXTOUCH) 100 UNIT/ML FlexPen    Sig: Inject 44 Units into the skin daily. ALONG WITH NEEDLES    Dispense:  15 mL    Refill:  3   HYDROcodone-acetaminophen (NORCO/VICODIN) 5-325 MG tablet    Sig: Take 1-2 tablets by mouth every 4 (four) hours as needed for moderate  pain or severe pain.    Dispense:  30 tablet    Refill:  0    Return in about 3 months (around 09/23/2021) for F/U, Recheck A1C, Jessie Schrieber PCP.   Total time spent:30 Minutes Time spent includes review of chart, medications, test results, and follow up plan with the patient.   [place] Controlled Substance Database was reviewed by me.  This patient was seen by [name], FNP-C in collaboration with Dr. Clayborn Bigness as a part of collaborative care agreement.   Stevie Ertle R. Valetta Fuller, MSN, FNP-C Internal medicine

## 2021-07-02 ENCOUNTER — Other Ambulatory Visit: Payer: Self-pay | Admitting: Nurse Practitioner

## 2021-07-03 ENCOUNTER — Encounter: Payer: Self-pay | Admitting: Oncology

## 2021-07-16 ENCOUNTER — Encounter (HOSPITAL_COMMUNITY): Payer: Self-pay

## 2021-07-26 ENCOUNTER — Other Ambulatory Visit: Payer: Self-pay

## 2021-07-26 ENCOUNTER — Telehealth: Payer: Self-pay | Admitting: Student-PharmD

## 2021-07-26 DIAGNOSIS — E1165 Type 2 diabetes mellitus with hyperglycemia: Secondary | ICD-10-CM

## 2021-07-26 MED ORDER — PEN NEEDLES 31G X 8 MM MISC: 3 refills | Status: DC

## 2021-07-26 NOTE — Progress Notes (Addendum)
Hypertension (HTN) Review Call   Dawn Alvarez, Dawn Alvarez 10 years, Female  DOB: [birth year]  M: [phone]  Hypertension Review (HC) Completed by [name] on 07/26/2021  Chart Review Is the patient enrolled in RPM with BP Monitor?: No  BP #1 reading (last): 130/80 on: 06/25/2021  BP #2 reading: 134/74 on: 05/20/2021  BP #3 reading: 180/100 on: 04/01/2021  Any of the last 3 BP > 140/90 mmHg?: Yes  What recent interventions have been made by any provider to improve the patient's conditions in the last 3 months?:  Office Visit:  05/20/21 Dawn Osgood, NP. For Type 2 diabetes mellitus with hyperglycemia, with long-term current use of insulin. CHANGED Hydrocodone-Acetaminophen 5-325 mg to 1-2 tablets every 6 hours PRN and Amlodipine 10 mg to daily. GIVEN: Pneumococcal vaccine.  06/25/21 Dawn Osgood, NP. For Acute left-sided low back pain with left-sided sciatica. STARTED Colchicine 0.6 mg  For a gout flare/attack: take 2 tablets by mouth then take 1 more tablet 1 hour later on day 1, then take 1 tablet by mouth daily until it resolves, Cyclobenzaprine 10 mg at bedtime PRN, and CHANGED Hydrocodone-Acetaminophen 5-325 mg to 1-2 tablets every 4 hours PRN.  Any recent hospitalizations or ED visits since last visit with CPP?: No  Adherence Review Adherence rates for STAR metric medications:  Lisinopril 130 mg 04/03/21 90 DS Atorvastatin 20 mg 04/03/21 90 DS  Adherence rates for medications indicated for disease state being reviewed: Lisinopril 130 mg 04/03/21 90 DS  Does the patient have >5 day gap between last estimated fill dates for any of the above medications?: Yes  Reasons for medication gaps: Lisinopril 130 mg 04/03/21 90 DS-Patient stated she has this medications.  Disease State Questions Able to connect with the Patient?: Yes  Is the patient monitoring his/her BP?: Yes  How often are you checking your BP?: occasionally  Home BP Reading #1  (most recent): 135/98  Is the patient having any low BP Readings <90/60?: No  Is the patient having any BP readings above >180/100?: No  Is the patient's average BP>140/90?: Yes  What is your blood pressure goal?: 120/80  Educate patient to inform proper points on checking BP at home:: When taking resting blood pressure: sit quietly for 5 minutes, not within 30 min. of exercising, no talking., Sit with feet flat on the floor, arm at heart level.  What diet changes have you made to improve your Blood Pressure Control?: eating more home-cooked meals, eating more fruits and vegetables  What exercise are you doing to improve your Blood Pressure Control?: walking  Engagement Notes [name] on 07/26/2021 11:59 AM HC Chart Review: 10 min 07/26/21 [facility] Assessment call time spent: 10 min 07/26/21  [name], [place]  [phone]  Pharmacist Review Adherence gaps identified?: No Drug Therapy Problems identified?: No Assessment: Controlled  Alena Bills Clinical Pharmacist

## 2021-07-29 ENCOUNTER — Inpatient Hospital Stay: Payer: Medicaid Other | Attending: Oncology

## 2021-07-29 ENCOUNTER — Other Ambulatory Visit: Payer: Self-pay

## 2021-07-29 DIAGNOSIS — Z452 Encounter for adjustment and management of vascular access device: Secondary | ICD-10-CM | POA: Diagnosis not present

## 2021-07-29 DIAGNOSIS — Z171 Estrogen receptor negative status [ER-]: Secondary | ICD-10-CM | POA: Insufficient documentation

## 2021-07-29 DIAGNOSIS — C50411 Malignant neoplasm of upper-outer quadrant of right female breast: Secondary | ICD-10-CM | POA: Diagnosis not present

## 2021-07-29 DIAGNOSIS — Z95828 Presence of other vascular implants and grafts: Secondary | ICD-10-CM

## 2021-07-29 MED ORDER — SODIUM CHLORIDE 0.9% FLUSH
10.0000 mL | Freq: Once | INTRAVENOUS | Status: AC
  Administered 2021-07-29: 10 mL via INTRAVENOUS
  Filled 2021-07-29: qty 10

## 2021-07-29 MED ORDER — HEPARIN SOD (PORK) LOCK FLUSH 100 UNIT/ML IV SOLN
INTRAVENOUS | Status: AC
  Filled 2021-07-29: qty 5

## 2021-07-29 MED ORDER — HEPARIN SOD (PORK) LOCK FLUSH 100 UNIT/ML IV SOLN
500.0000 [IU] | Freq: Once | INTRAVENOUS | Status: AC
  Administered 2021-07-29: 500 [IU] via INTRAVENOUS
  Filled 2021-07-29: qty 5

## 2021-07-30 DIAGNOSIS — E1165 Type 2 diabetes mellitus with hyperglycemia: Secondary | ICD-10-CM

## 2021-07-30 DIAGNOSIS — I10 Essential (primary) hypertension: Secondary | ICD-10-CM

## 2021-07-30 DIAGNOSIS — J449 Chronic obstructive pulmonary disease, unspecified: Secondary | ICD-10-CM

## 2021-08-05 ENCOUNTER — Other Ambulatory Visit: Payer: Self-pay

## 2021-08-05 DIAGNOSIS — E1165 Type 2 diabetes mellitus with hyperglycemia: Secondary | ICD-10-CM

## 2021-08-05 DIAGNOSIS — Z794 Long term (current) use of insulin: Secondary | ICD-10-CM

## 2021-08-05 MED ORDER — PEN NEEDLES 31G X 8 MM MISC: 3 refills | Status: DC

## 2021-08-05 MED ORDER — OZEMPIC (1 MG/DOSE) 2 MG/1.5ML ~~LOC~~ SOPN: 1.0000 mg | PEN_INJECTOR | SUBCUTANEOUS | 6 refills | Status: DC

## 2021-08-26 ENCOUNTER — Telehealth: Payer: Self-pay

## 2021-08-26 ENCOUNTER — Encounter: Payer: Self-pay | Admitting: Internal Medicine

## 2021-08-26 ENCOUNTER — Other Ambulatory Visit: Payer: Self-pay

## 2021-08-26 MED ORDER — BRIMONIDINE TARTRATE 0.2 % OP SOLN: OPHTHALMIC | 1 refills | Status: DC

## 2021-08-26 NOTE — Telephone Encounter (Signed)
Spoke to pt, she has picked up med  ?

## 2021-08-31 ENCOUNTER — Encounter: Payer: Self-pay | Admitting: Oncology

## 2021-09-04 ENCOUNTER — Other Ambulatory Visit: Payer: Self-pay

## 2021-09-04 DIAGNOSIS — I10 Essential (primary) hypertension: Secondary | ICD-10-CM

## 2021-09-04 MED ORDER — LISINOPRIL 30 MG PO TABS: ORAL_TABLET | ORAL | 1 refills | Status: DC

## 2021-09-05 ENCOUNTER — Ambulatory Visit
Admission: RE | Admit: 2021-09-05 | Discharge: 2021-09-05 | Disposition: A | Discharge status: Home / Self Care | Payer: Medicare Other | Source: Ambulatory Visit | Attending: Radiation Oncology | Admitting: Radiation Oncology

## 2021-09-05 ENCOUNTER — Other Ambulatory Visit: Payer: Self-pay

## 2021-09-05 ENCOUNTER — Encounter: Payer: Self-pay | Admitting: Radiation Oncology

## 2021-09-05 DIAGNOSIS — Z853 Personal history of malignant neoplasm of breast: Secondary | ICD-10-CM | POA: Insufficient documentation

## 2021-09-05 DIAGNOSIS — Z923 Personal history of irradiation: Secondary | ICD-10-CM | POA: Insufficient documentation

## 2021-09-05 DIAGNOSIS — Z171 Estrogen receptor negative status [ER-]: Secondary | ICD-10-CM

## 2021-09-05 DIAGNOSIS — Z08 Encounter for follow-up examination after completed treatment for malignant neoplasm: Secondary | ICD-10-CM | POA: Diagnosis not present

## 2021-09-05 NOTE — Progress Notes (Signed)
Radiation Oncology ?Follow up Note ? ?Name: Dawn Alvarez   ?Date:   09/05/2021 ?MRN:  [id number] ?DOB: November 06, 1955  ? ? ?This 66 y.o. 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 ? ?REFERRING PROVIDER: Lavera Guise, MD ? 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  Seen today in routine follow-up she is doing well.  She specifically denies breast tenderness cough or bone pain..  She had mammograms back in May which were BI-RADS 2 benign which I have reviewed.  She is not on antiestrogen therapy based on the triple negative nature of her disease. ? ?COMPLICATIONS OF TREATMENT: none ? ?FOLLOW UP COMPLIANCE: keeps appointments  ? ?PHYSICAL EXAM:  ?BP (!) (P) 159/76 (BP Location: Left Arm, Patient Position: Sitting)   Pulse (P) 90   Temp (!) (P) 96.3 ?F (35.7 ?C) (Tympanic)   Resp (P) 16   Ht (P) '5\' 2"'$  (1.575 m)   Wt (P) 170 lb 1.6 oz (77.2 kg)   BMI (P) 31.11 kg/m?  ?Lungs are clear to A&P cardiac examination essentially unremarkable with regular rate and rhythm. No dominant mass or nodularity is noted in either breast in 2 positions examined. Incision is well-healed. No axillary or supraclavicular adenopathy is appreciated. Cosmetic result is excellent.  Well-developed well-nourished patient in NAD. HEENT reveals PERLA, EOMI, discs not visualized.  Oral cavity is clear. No oral mucosal lesions are identified. Neck is clear without evidence of cervical or supraclavicular adenopathy. Lungs are clear to A&P. Cardiac examination is essentially unremarkable with regular rate and rhythm without murmur rub or thrill. Abdomen is benign with no organomegaly or masses noted. Motor sensory and DTR levels are equal and symmetric in the upper and lower  extremities. Cranial nerves II through XII are grossly intact. Proprioception is intact. No peripheral adenopathy or edema is identified. No motor or sensory levels are noted. Crude visual fields are within normal range. ? ?RADIOLOGY RESULTS: Mammogram and ultrasound reviewed compatible with above-stated findings ? ?PLAN: Present time patient is doing well 1 year out with no evidence of disease.  And pleased with her overall progress.  Of asked to see her back in 1 year for follow-up.  Patient knows to call with any concerns at any time. ? ?I would like to take this opportunity to thank you for allowing me to participate in the care of your patient.. ?  ? Noreene Filbert, MD ? ?

## 2021-09-10 ENCOUNTER — Encounter: Payer: Self-pay | Admitting: Oncology

## 2021-09-17 ENCOUNTER — Encounter: Payer: Self-pay | Admitting: Nurse Practitioner

## 2021-09-17 ENCOUNTER — Telehealth: Payer: Self-pay

## 2021-09-17 ENCOUNTER — Ambulatory Visit (INDEPENDENT_AMBULATORY_CARE_PROVIDER_SITE_OTHER): Payer: Medicare Other | Admitting: Nurse Practitioner

## 2021-09-17 VITALS — BP 120/68 | HR 78 | Temp 98.6°F | Resp 16 | Ht 61.0 in | Wt 172.0 lb

## 2021-09-17 DIAGNOSIS — Z794 Long term (current) use of insulin: Secondary | ICD-10-CM

## 2021-09-17 DIAGNOSIS — Z1231 Encounter for screening mammogram for malignant neoplasm of breast: Secondary | ICD-10-CM

## 2021-09-17 DIAGNOSIS — I10 Essential (primary) hypertension: Secondary | ICD-10-CM | POA: Diagnosis not present

## 2021-09-17 DIAGNOSIS — M10072 Idiopathic gout, left ankle and foot: Secondary | ICD-10-CM | POA: Diagnosis not present

## 2021-09-17 DIAGNOSIS — E1165 Type 2 diabetes mellitus with hyperglycemia: Secondary | ICD-10-CM | POA: Diagnosis not present

## 2021-09-17 DIAGNOSIS — Z780 Asymptomatic menopausal state: Secondary | ICD-10-CM

## 2021-09-17 DIAGNOSIS — Z853 Personal history of malignant neoplasm of breast: Secondary | ICD-10-CM

## 2021-09-17 LAB — POCT GLYCOSYLATED HEMOGLOBIN (HGB A1C): HbA1c POC (<> result, manual entry): 5.8 % (ref 4.0–5.6)

## 2021-09-17 MED ORDER — COLCHICINE 0.6 MG PO TABS: ORAL_TABLET | ORAL | 2 refills | Status: DC

## 2021-09-17 MED ORDER — LEVEMIR FLEXTOUCH 100 UNIT/ML ~~LOC~~ SOPN: 44.0000 [IU] | PEN_INJECTOR | Freq: Every day | SUBCUTANEOUS | 3 refills | Status: DC

## 2021-09-17 NOTE — Telephone Encounter (Signed)
Lvm notifying patient of mammogram, u/s & dexa scan appointment date & time. Instructed no deodorant, powder or lotion & two-piece outfit-Toni ?

## 2021-09-17 NOTE — Progress Notes (Signed)
[facility] ?[address] ?[place], [place] [address] ? ?Internal MEDICINE  ?Office Visit Note ? ?Patient Name: Dawn Alvarez ? [id number]  ?[id number] ? ?Date of Service: 09/17/2021 ? ?Chief Complaint  ?Patient presents with  ? Follow-up  ? Diabetes  ? Medication Refill  ? Hyperlipidemia  ? Hypertension  ? COPD  ? Asthma  ? ? ?HPI ?Dawn Alvarez presents for follow-up visit for diabetes, hypertension, hyperlipidemia, COPD, and medication refills.  Her A1c is 5.8 today which is significantly improved from 6.9 in January this year.  She is still on 44 units of Levemir once daily and does need refills.  She has not had any issues with her glucose level going to low so she has not had to decrease her dose of Levemir. ?Her blood pressure is stable and well-controlled with current medications.  She continues to take colchicine for gout flareups and she states they might happen a few times a year so she does not need a daily preventative at this time. ?She is due for a repeat diagnostic mammogram in May and has not had her bone density scan done yet. ? ? ?Current Medication: ?Outpatient Encounter Medications as of 09/17/2021  ?Medication Sig  ? Alcohol Swabs (B-D SINGLE USE SWABS REGULAR) PADS Use as directed twice a day  ? allopurinol (ZYLOPRIM) 100 MG tablet Take 1 tablet (100 mg total) by mouth at bedtime. Take 1 tablet by mouth at night for Gout  ? amLODipine (NORVASC) 10 MG tablet Take 1 tablet (10 mg total) by mouth daily.  ? atorvastatin (LIPITOR) 20 MG tablet Take 1 tablet (20 mg total) by mouth daily.  ? Blood Glucose Monitoring Suppl (TRUE METRIX AIR GLUCOSE METER) w/Device KIT 1 Device by Does not apply route in the morning and at bedtime. Use ad directed e11.65  ? brimonidine (ALPHAGAN) 0.2 % ophthalmic solution INSTILL 1 DROP INTO EACH EYE TWICE DAILY  ? Calcium Carb-Cholecalciferol (CALCIUM 600 + D) 600-200 MG-UNIT TABS Take 1 tablet by mouth daily.  ? celecoxib (CELEBREX) 200 MG capsule Take one tab po  qd for one week and then as needed  ? cyclobenzaprine (FLEXERIL) 10 MG tablet Take 1 tablet (10 mg total) by mouth at bedtime as needed for muscle spasms.  ? gabapentin (NEURONTIN) 300 MG capsule Take 1 capsule (300 mg total) by mouth 3 (three) times daily.  ? glucose blood (TRUE METRIX BLOOD GLUCOSE TEST) test strip Use as instructed twice a daily diag e11.65  ? HYDROcodone-acetaminophen (NORCO/VICODIN) 5-325 MG tablet Take 1 tablet by mouth every 6 (six) hours as needed for moderate pain or severe pain.  ? Insulin Pen Needle (PEN NEEDLES) 31G X 8 MM MISC Use as directed with insulin E11.65  ? Lancets Misc. (ACCU-CHEK MULTICLIX LANCET DEV) KIT by Does not apply route. Use as directed twice a day diag E11.65  ? lidocaine-prilocaine (EMLA) cream SMARTSIG:1 Topical Every Night  ? lisinopril (ZESTRIL) 30 MG tablet Take one tab po qd for HTN  ? Omega-3 Fatty Acids (FISH OIL) 1000 MG CAPS Take 1,000 mg by mouth daily.   ? Semaglutide, 1 MG/DOSE, (OZEMPIC, 1 MG/DOSE,) 2 MG/1.5ML SOPN Inject 1 mg into the skin once a week. FOR DM  ? SIMBRINZA 1-0.2 % SUSP   ? TRUEplus Lancets 30G MISC Use as directed twice daily diag e11.65  ? [DISCONTINUED] colchicine 0.6 MG tablet For a gout flare/attack: take 2 tablets by mouth then take 1 more tablet 1 hour later on day 1, then take  1 tablet by mouth daily until it resolves.  ? [DISCONTINUED] insulin detemir (LEVEMIR FLEXTOUCH) 100 UNIT/ML FlexPen Inject 44 Units into the skin daily. ALONG WITH NEEDLES  ? colchicine 0.6 MG tablet For a gout flare/attack: take 2 tablets by mouth then take 1 more tablet 1 hour later on day 1, then take 1 tablet by mouth daily until it resolves.  ? insulin detemir (LEVEMIR FLEXTOUCH) 100 UNIT/ML FlexPen Inject 44 Units into the skin daily. ALONG WITH NEEDLES  ? ?Facility-Administered Encounter Medications as of 09/17/2021  ?Medication  ? heparin lock flush 100 UNIT/ML injection  ? sodium chloride flush (NS) 0.9 % injection 10 mL  ? ? ?Surgical  History: ?Past Surgical History:  ?Procedure Laterality Date  ? BREAST BIOPSY Right 07/05/2019  ? [place] bx venus marker, grade 3 invasive mammary carcinoma  ? BREAST BIOPSY Right 07/05/2019  ? LN bx, hydromarker,PREDOMINANTLY BLOOD AND FIBROADIPOSE TISSUE, WITH SCANT LYMPHOID TISSUE PRESENT      ? BREAST LUMPECTOMY Right 05/04/2020  ? 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 BREAST LUMPECTOMY WITH RADIOACTIVE SEED AND SENTINEL LYMPH NODE BIOPSY Right 05/04/2020  ? Procedure: RIGHT BREAST LUMPECTOMY WITH BRACKETED RADIOACTIVE SEED AND SENTINEL LYMPH NODE BIOPSY;  Surgeon: Jovita Kussmaul, MD;  Location: [facility];  Service: General;  Laterality: Right;  ? CESAREAN SECTION    ? COLONOSCOPY WITH PROPOFOL N/A 09/11/2015  ? Procedure: COLONOSCOPY WITH PROPOFOL;  Surgeon: Lucilla Lame, MD;  Location: [facility] END[facility]COPY;  Service: Endoscopy;  Laterality: N/A;  ? IR IMAGING GUIDED PORT INSERTION  07/20/2019  ? ? ?Medical History: ?Past Medical History:  ?Diagnosis Date  ? Asthma   ? Cancer ([place]) 03/2020  ? right breast IMC  ? COPD exacerbation ([place]) 04/12/2016  ? Diabetes mellitus without complication ([place])   ? Hyperlipemia   ? Hypertension   ? Personal history of chemotherapy 2021  ? ? ?Family History: ?Family History  ?Problem Relation Age of Onset  ? Diabetes Mother   ? Hypertension Mother   ? Hypertension Father   ? Diabetes Father   ? Breast cancer Neg Hx   ? ? ?Social History  ? ?Socioeconomic History  ? Marital status: Married  ?  Spouse name: Not on file  ? Number of children: Not on file  ? Years of education: Not on file  ? Highest education level: Not on file  ?Occupational History  ? Not on file  ?Tobacco Use  ? Smoking status: Former  ?  Packs/day: 1.00  ?  Years: 2.00  ?  Pack years: 2.00  ?  Types: Cigarettes  ?  Quit date: 02/29/2020  ?  Years since quitting: 1.5  ? Smokeless tobacco: Never  ?Substance and Sexual Activity  ? Alcohol use: Yes  ?  Comment: social  ? Drug  use: No  ? Sexual activity: Not Currently  ?  Birth control/protection: Post-menopausal  ?Other Topics Concern  ? Not on file  ?Social History Narrative  ? Not on file  ? ?Social Determinants of Health  ? ?Financial Resource Strain: Low Risk   ? Difficulty of Paying Living Expenses: Not very hard  ?Food Insecurity: Not on file  ?Transportation Needs: Not on file  ?Physical Activity: Not on file  ?Stress: Not on file  ?Social Connections: Not on file  ?Intimate Partner Violence: Not on file  ? ? ? ? ?Review of Systems  ?Constitutional:  Negative for chills, fatigue and unexpected weight change.  ?  HENT:  Negative for congestion, rhinorrhea, sneezing and sore throat.   ?Eyes:  Negative for redness.  ?Respiratory:  Negative for cough, chest tightness and shortness of breath.   ?Cardiovascular:  Negative for chest pain and palpitations.  ?Gastrointestinal:  Negative for abdominal pain, constipation, diarrhea, nausea and vomiting.  ?Genitourinary:  Negative for dysuria and frequency.  ?Musculoskeletal:  Negative for arthralgias, back pain, joint swelling and neck pain.  ?Skin:  Negative for rash.  ?Neurological: Negative.  Negative for tremors and numbness.  ?Hematological:  Negative for adenopathy. Does not bruise/bleed easily.  ?Psychiatric/Behavioral:  Negative for behavioral problems (Depression), sleep disturbance and suicidal ideas. The patient is not nervous/anxious.   ? ?Vital Signs: ?BP 120/68   Pulse 78   Temp 98.6 ?F (37 ?C)   Resp 16   Ht '5\' 1"'  (1.549 m)   Wt 172 lb (78 kg)   SpO2 98%   BMI 32.50 kg/m?  ? ? ?Physical Exam ?Vitals reviewed.  ?Constitutional:   ?   Appearance: Normal appearance.  ?HENT:  ?   Head: Normocephalic and atraumatic.  ?Eyes:  ?   Pupils: Pupils are equal, round, and reactive to light.  ?Cardiovascular:  ?   Rate and Rhythm: Normal rate and regular rhythm.  ?Pulmonary:  ?   Effort: Pulmonary effort is normal. No respiratory distress.  ?Neurological:  ?   Mental Status: She is  alert.  ?Psychiatric:     ?   Mood and Affect: Mood normal.     ?   Behavior: Behavior normal.  ? ? ? ? ? ?Assessment/Plan: ?1. Type 2 diabetes mellitus with hyperglycemia, with long-term current use of insulin ([place]) ?A1C

## 2021-09-25 ENCOUNTER — Telehealth: Payer: Medicare Other

## 2021-10-02 ENCOUNTER — Ambulatory Visit: Payer: Medicaid Other | Admitting: Nurse Practitioner

## 2021-10-03 ENCOUNTER — Encounter: Payer: Self-pay | Admitting: Nurse Practitioner

## 2021-10-03 ENCOUNTER — Ambulatory Visit (INDEPENDENT_AMBULATORY_CARE_PROVIDER_SITE_OTHER): Payer: Medicare Other | Admitting: Nurse Practitioner

## 2021-10-03 VITALS — BP 140/85 | HR 80 | Temp 98.4°F | Resp 16 | Ht 61.0 in | Wt 172.2 lb

## 2021-10-03 DIAGNOSIS — I10 Essential (primary) hypertension: Secondary | ICD-10-CM | POA: Diagnosis not present

## 2021-10-03 DIAGNOSIS — E041 Nontoxic single thyroid nodule: Secondary | ICD-10-CM

## 2021-10-03 DIAGNOSIS — E559 Vitamin D deficiency, unspecified: Secondary | ICD-10-CM

## 2021-10-03 DIAGNOSIS — Z853 Personal history of malignant neoplasm of breast: Secondary | ICD-10-CM

## 2021-10-03 DIAGNOSIS — E782 Mixed hyperlipidemia: Secondary | ICD-10-CM

## 2021-10-03 DIAGNOSIS — R300 Dysuria: Secondary | ICD-10-CM | POA: Diagnosis not present

## 2021-10-03 DIAGNOSIS — M10072 Idiopathic gout, left ankle and foot: Secondary | ICD-10-CM

## 2021-10-03 DIAGNOSIS — E1165 Type 2 diabetes mellitus with hyperglycemia: Secondary | ICD-10-CM | POA: Diagnosis not present

## 2021-10-03 DIAGNOSIS — Z716 Tobacco abuse counseling: Secondary | ICD-10-CM | POA: Diagnosis not present

## 2021-10-03 DIAGNOSIS — Z794 Long term (current) use of insulin: Secondary | ICD-10-CM

## 2021-10-03 DIAGNOSIS — M1A0720 Idiopathic chronic gout, left ankle and foot, without tophus (tophi): Secondary | ICD-10-CM | POA: Diagnosis not present

## 2021-10-03 DIAGNOSIS — Z0001 Encounter for general adult medical examination with abnormal findings: Secondary | ICD-10-CM | POA: Diagnosis not present

## 2021-10-03 DIAGNOSIS — G629 Polyneuropathy, unspecified: Secondary | ICD-10-CM

## 2021-10-03 DIAGNOSIS — J449 Chronic obstructive pulmonary disease, unspecified: Secondary | ICD-10-CM | POA: Diagnosis not present

## 2021-10-03 DIAGNOSIS — J441 Chronic obstructive pulmonary disease with (acute) exacerbation: Secondary | ICD-10-CM

## 2021-10-03 MED ORDER — NICOTINE POLACRILEX 2 MG MT GUM: 2.0000 mg | CHEWING_GUM | OROMUCOSAL | 0 refills | Status: DC | PRN

## 2021-10-03 NOTE — Progress Notes (Cosign Needed)
[facility] [place], [place] [address]  Internal MEDICINE  Office Visit Note  Patient Name: Dawn Alvarez  [id number]  [id number]  Date of Service: 10/03/2021  Chief Complaint  Patient presents with   Medicare Wellness   Diabetes   Hyperlipidemia   Hypertension   COPD   Asthma   Medication Refill    HPI Dawn Alvarez presents for an annual well visit and physical exam.  She is a well-appearing 66 year old female with hypertension, COPD, diabetes, neuropathy, hyperlipidemia and a history of breast cancer.  She is a current smoker but she is interested in quitting and would like assistance with smoking cessation.  Her most recent A1c is significantly improved at 5.8 compared to her previous A1c of 6.9 in January this year. She denies any significant change in her respiratory status and denies any significant cough, shortness of breath, chest tightness or wheezing today. Her blood pressure is stable and controlled with current medications --She is due for routine labs as well as a bone density scan and mammogram in June --Her most recent Pap smear was in May last year. -Her last colonoscopy was 2017 and she is due for repeat routine colonoscopy in 2027 --He had diabetic eye exam in February this year    Current Medication: Outpatient Encounter Medications as of 10/03/2021  Medication Sig   Alcohol Swabs (B-D SINGLE USE SWABS REGULAR) PADS Use as directed twice a day   allopurinol (ZYLOPRIM) 100 MG tablet Take 1 tablet (100 mg total) by mouth at bedtime. Take 1 tablet by mouth at night for Gout   amLODipine (NORVASC) 10 MG tablet Take 1 tablet (10 mg total) by mouth daily.   atorvastatin (LIPITOR) 20 MG tablet Take 1 tablet (20 mg total) by mouth daily.   Blood Glucose Monitoring Suppl (TRUE METRIX AIR GLUCOSE METER) w/Device KIT 1 Device by Does not apply route in the morning and at bedtime. Use ad directed e11.65   brimonidine (ALPHAGAN) 0.2 % ophthalmic  solution INSTILL 1 DROP INTO EACH EYE TWICE DAILY   Calcium Carb-Cholecalciferol (CALCIUM 600 + D) 600-200 MG-UNIT TABS Take 1 tablet by mouth daily.   celecoxib (CELEBREX) 200 MG capsule Take one tab po qd for one week and then as needed   colchicine 0.6 MG tablet For a gout flare/attack: take 2 tablets by mouth then take 1 more tablet 1 hour later on day 1, then take 1 tablet by mouth daily until it resolves.   cyclobenzaprine (FLEXERIL) 10 MG tablet Take 1 tablet (10 mg total) by mouth at bedtime as needed for muscle spasms.   gabapentin (NEURONTIN) 300 MG capsule Take 1 capsule (300 mg total) by mouth 3 (three) times daily.   glucose blood (TRUE METRIX BLOOD GLUCOSE TEST) test strip Use as instructed twice a daily diag e11.65   HYDROcodone-acetaminophen (NORCO/VICODIN) 5-325 MG tablet Take 1 tablet by mouth every 6 (six) hours as needed for moderate pain or severe pain.   insulin detemir (LEVEMIR FLEXTOUCH) 100 UNIT/ML FlexPen Inject 44 Units into the skin daily. ALONG WITH NEEDLES   Insulin Pen Needle (PEN NEEDLES) 31G X 8 MM MISC Use as directed with insulin E11.65   Lancets Misc. (ACCU-CHEK MULTICLIX LANCET DEV) KIT by Does not apply route. Use as directed twice a day diag E11.65   lidocaine-prilocaine (EMLA) cream SMARTSIG:1 Topical Every Night   lisinopril (ZESTRIL) 30 MG tablet Take one tab po qd for HTN   nicotine polacrilex ([employer] NICOTINE) 2 MG gum  Take 1 each (2 mg total) by mouth as needed for smoking cessation. (Patient not taking: Reported on 10/11/2021)   Omega-3 Fatty Acids (FISH OIL) 1000 MG CAPS Take 1,000 mg by mouth daily.    Semaglutide, 1 MG/DOSE, (OZEMPIC, 1 MG/DOSE,) 2 MG/1.5ML SOPN Inject 1 mg into the skin once a week. FOR DM   SIMBRINZA 1-0.2 % SUSP    TRUEplus Lancets 30G MISC Use as directed twice daily diag e11.65   Facility-Administered Encounter Medications as of 10/03/2021  Medication   heparin lock flush 100 UNIT/ML injection   sodium chloride flush (NS) 0.9 %  injection 10 mL    Surgical History: Past Surgical History:  Procedure Laterality Date   BREAST BIOPSY Right 07/05/2019   [place] bx venus marker, grade 3 invasive mammary carcinoma   BREAST BIOPSY Right 07/05/2019   LN bx, hydromarker,PREDOMINANTLY BLOOD AND FIBROADIPOSE TISSUE, WITH SCANT LYMPHOID TISSUE PRESENT       BREAST LUMPECTOMY Right 05/04/2020   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   BREAST LUMPECTOMY WITH RADIOACTIVE SEED AND SENTINEL LYMPH NODE BIOPSY Right 05/04/2020   Procedure: RIGHT BREAST LUMPECTOMY WITH BRACKETED RADIOACTIVE SEED AND SENTINEL LYMPH NODE BIOPSY;  Surgeon: Jovita Kussmaul, MD;  Location: [place];  Service: General;  Laterality: Right;   CESAREAN SECTION     COLONOSCOPY WITH PROPOFOL N/A 09/11/2015   Procedure: COLONOSCOPY WITH PROPOFOL;  Surgeon: Lucilla Lame, MD;  Location: [facility];  Service: Endoscopy;  Laterality: N/A;   IR IMAGING GUIDED PORT INSERTION  07/20/2019    Medical History: Past Medical History:  Diagnosis Date   Asthma    Cancer ([place]) 03/2020   right breast IMC   COPD exacerbation ([place]) 04/12/2016   Diabetes mellitus without complication (HCC)    Hyperlipemia    Hypertension    Personal history of chemotherapy 2021    Family History: Family History  Problem Relation Age of Onset   Diabetes Mother    Hypertension Mother    Hypertension Father    Diabetes Father    Breast cancer Neg Hx     Social History   Socioeconomic History   Marital status: Married    Spouse name: Not on file   Number of children: Not on file   Years of education: Not on file   Highest education level: Not on file  Occupational History   Not on file  Tobacco Use   Smoking status: Former    Packs/day: 1.00    Years: 2.00    Total pack years: 2.00    Types: Cigarettes    Quit date: 02/29/2020    Years since quitting: 1.7   Smokeless tobacco: Never  Substance and Sexual Activity   Alcohol  use: Yes    Comment: social   Drug use: No   Sexual activity: Not Currently    Birth control/protection: Post-menopausal  Other Topics Concern   Not on file  Social History Narrative   Not on file   Social Determinants of Health   Financial Resource Strain: Low Risk  (10/24/2020)   Overall Financial Resource Strain (CARDIA)    Difficulty of Paying Living Expenses: Not very hard  Food Insecurity: Not on file  Transportation Needs: Not on file  Physical Activity: Not on file  Stress: Not on file  Social Connections: Not on file  Intimate Partner Violence: Not on file      Review of Systems  Constitutional:  Negative for activity  change, appetite change, chills, fatigue, fever and unexpected weight change.  HENT: Negative.  Negative for congestion, ear pain, rhinorrhea, sore throat and trouble swallowing.   Eyes: Negative.   Respiratory: Negative.  Negative for cough, chest tightness, shortness of breath and wheezing.   Cardiovascular: Negative.  Negative for chest pain.  Gastrointestinal: Negative.  Negative for abdominal pain, blood in stool, constipation, diarrhea, nausea and vomiting.  Endocrine: Negative.   Genitourinary: Negative.  Negative for difficulty urinating, dysuria, frequency, hematuria and urgency.  Musculoskeletal: Negative.  Negative for arthralgias, back pain, joint swelling, myalgias and neck pain.  Skin: Negative.  Negative for rash and wound.  Allergic/Immunologic: Negative.  Negative for immunocompromised state.  Neurological: Negative.  Negative for dizziness, seizures, numbness and headaches.  Hematological: Negative.   Psychiatric/Behavioral: Negative.  Negative for behavioral problems, self-injury and suicidal ideas. The patient is not nervous/anxious.     Vital Signs: BP 140/85   Pulse 80   Temp 98.4 F (36.9 C)   Resp 16   Ht '5\' 1"'  (1.549 m)   Wt 172 lb 3.2 oz (78.1 kg)   SpO2 99%   BMI 32.54 kg/m    Physical Exam Vitals reviewed.   Constitutional:      General: She is awake. She is not in acute distress.    Appearance: Normal appearance. She is well-developed and well-groomed. She is obese. She is not ill-appearing or diaphoretic.  HENT:     Head: Normocephalic and atraumatic.     Right Ear: Tympanic membrane, ear canal and external ear normal.     Left Ear: Tympanic membrane, ear canal and external ear normal.     Nose: Nose normal. No congestion or rhinorrhea.     Mouth/Throat:     Lips: Pink.     Mouth: Mucous membranes are moist.     Pharynx: Oropharynx is clear. Uvula midline. No oropharyngeal exudate or posterior oropharyngeal erythema.  Eyes:     General: Lids are normal. Vision grossly intact. Gaze aligned appropriately. No scleral icterus.       Right eye: No discharge.        Left eye: No discharge.     Extraocular Movements: Extraocular movements intact.     Conjunctiva/sclera: Conjunctivae normal.     Pupils: Pupils are equal, round, and reactive to light.     Funduscopic exam:    Right eye: Red reflex present.        Left eye: Red reflex present. Neck:     Thyroid: No thyromegaly.     Vascular: No JVD.     Trachea: Trachea and phonation normal. No tracheal deviation.  Cardiovascular:     Rate and Rhythm: Normal rate and regular rhythm.     Pulses:          Dorsalis pedis pulses are 3+ on the right side and 3+ on the left side.       Posterior tibial pulses are 3+ on the right side and 3+ on the left side.     Heart sounds: Normal heart sounds, S1 normal and S2 normal. No murmur heard.    No friction rub. No gallop.  Pulmonary:     Effort: Pulmonary effort is normal. No accessory muscle usage or respiratory distress.     Breath sounds: Normal breath sounds and air entry. No stridor. No decreased breath sounds, wheezing or rales.  Chest:     Chest wall: No tenderness.     Comments: Declined clinical breast exam, mammogram scheduled  in june Abdominal:     General: Bowel sounds are normal.  There is no distension.     Palpations: Abdomen is soft. There is no shifting dullness, fluid wave, mass or pulsatile mass.     Tenderness: There is no abdominal tenderness. There is no guarding or rebound.  Musculoskeletal:        General: No tenderness or deformity. Normal range of motion.     Cervical back: Normal range of motion and neck supple.     Right lower leg: No edema.     Left lower leg: No edema.     Right foot: Normal range of motion.     Left foot: Normal range of motion.  Feet:     Right foot:     Protective Sensation: 6 sites tested.  4 sites sensed.     Skin integrity: Callus and dry skin present.     Toenail Condition: Right toenails are abnormally thick, long and ingrown.     Left foot:     Protective Sensation: 6 sites tested.  4 sites sensed.     Skin integrity: Callus and dry skin present.     Toenail Condition: Left toenails are abnormally thick, long and ingrown.  Lymphadenopathy:     Cervical: No cervical adenopathy.  Skin:    General: Skin is warm and dry.     Capillary Refill: Capillary refill takes less than 2 seconds.     Coloration: Skin is not pale.     Findings: No erythema or rash.  Neurological:     Mental Status: She is alert and oriented to person, place, and time.     Cranial Nerves: No cranial nerve deficit.     Motor: No abnormal muscle tone.     Coordination: Coordination normal.     Gait: Gait normal.     Deep Tendon Reflexes: Reflexes are normal and symmetric.  Psychiatric:        Mood and Affect: Mood normal.        Behavior: Behavior normal. Behavior is cooperative.        Thought Content: Thought content normal.        Judgment: Judgment normal.    Diabetic Foot Exam - Simple   Simple Foot Form Diabetic Foot exam was performed with the following findings: Yes 10/03/2021 10:14 AM  Visual Inspection Sensation Testing Pulse Check Comments         Assessment/Plan: 1. Encounter for general adult medical examination with  abnormal findings Age-appropriate preventive screenings and vaccinations discussed, annual physical exam completed. Routine labs for health maintenance ordered, see below. PHM updated.  Mammogram and bone density scan are scheduled for June for routine screenings.  All other preventive screenings are up-to-date at this time. - CBC with Differential/Platelet - CMP14+EGFR - TSH + free T4 - Lipid Profile - Vitamin D (25 hydroxy)  2. Type 2 diabetes mellitus with hyperglycemia, with long-term current use of insulin (HCC) Routine labs ordered, most recent A1c is significantly improved, will repeat in 3 months - CBC with Differential/Platelet - CMP14+EGFR - TSH + free T4 - Lipid Profile - Vitamin D (25 hydroxy)  3. Essential hypertension Blood pressure currently stable with current medications, routine labs ordered - CBC with Differential/Platelet - CMP14+EGFR - TSH + free T4 - Lipid Profile - Vitamin D (25 hydroxy)  4. COPD without exacerbation ([place]) Current respiratory status is stable no significant change, patient wanting to quit smoking which will be beneficial to her respiratory status  5. Thyroid nodule Routine lab ordered - TSH + free T4  6. Idiopathic chronic gout of left foot without tophus Routine labs ordered - CBC with Differential/Platelet - CMP14+EGFR - TSH + free T4 - Lipid Profile - Vitamin D (25 hydroxy)  7. Vitamin D deficiency Routine labs ordered - Vitamin D (25 hydroxy)  8. Mixed hyperlipidemia Routine labs ordered - CMP14+EGFR - TSH + free T4 - Lipid Profile  9. Dysuria Routine urinalysis done - UA/M w/rflx Culture, Routine - Microscopic Examination - Urine Culture, Reflex  10. History of right breast cancer Followed by oncology  11. Tobacco abuse counseling Patient does want to quit smoking and is requesting nicotine - nicotine polacrilex (CVS NICOTINE) 2 MG gum; Take 1 each (2 mg total) by mouth as needed for smoking cessation.  Dispense:  100 tablet; Refill: 0      General Counseling: Enisa verbalizes understanding of the findings of todays visit and agrees with plan of treatment. I have discussed any further diagnostic evaluation that may be needed or ordered today. We also reviewed her medications today. she has been encouraged to call the office with any questions or concerns that should arise related to todays visit.    Orders Placed This Encounter  Procedures   Microscopic Examination   Urine Culture, Reflex   UA/M w/rflx Culture, Routine   CBC with Differential/Platelet   CMP14+EGFR   TSH + free T4   Lipid Profile   Vitamin D (25 hydroxy)    Meds ordered this encounter  Medications   nicotine polacrilex ([employer]S NICOTINE) 2 MG gum    Sig: Take 1 each (2 mg total) by mouth as needed for smoking cessation.    Dispense:  100 tablet    Refill:  0    Return for previously scheduled, F/U, Bama Hanselman PCP in july.   Total time spent:30 Minutes Time spent includes review of chart, medications, test results, and follow up plan with the patient.   [place] Controlled Substance Database was reviewed by me.  This patient was seen by [name], FNP-C in collaboration with Dr. Clayborn Bigness as a part of collaborative care agreement.  Cordarrel Stiefel R. Valetta Fuller, MSN, FNP-C Internal medicine

## 2021-10-08 ENCOUNTER — Telehealth: Payer: Self-pay | Admitting: *Deleted

## 2021-10-08 LAB — UA/M W/RFLX CULTURE, ROUTINE
Bilirubin, UA: NEGATIVE
Glucose, UA: NEGATIVE
Ketones, UA: NEGATIVE
Nitrite, UA: POSITIVE — AB
Protein,UA: NEGATIVE
RBC, UA: NEGATIVE
Specific Gravity, UA: 1.008 (ref 1.005–1.030)
Urobilinogen, Ur: 0.2 mg/dL (ref 0.2–1.0)
pH, UA: 5.5 (ref 5.0–7.5)

## 2021-10-08 LAB — MICROSCOPIC EXAMINATION
Casts: NONE SEEN /lpf
RBC, Urine: NONE SEEN /hpf (ref 0–2)
WBC, UA: >30 /hpf — AB (ref 0–5)

## 2021-10-08 LAB — URINE CULTURE, REFLEX

## 2021-10-08 NOTE — Telephone Encounter (Signed)
Patient called asking when her port flush is to be. She has no appts with [place] as she was a No Show 03/25/21. Please call her to reschedule missed appts and port flush ?

## 2021-10-09 DIAGNOSIS — M10072 Idiopathic gout, left ankle and foot: Secondary | ICD-10-CM | POA: Diagnosis not present

## 2021-10-09 DIAGNOSIS — E559 Vitamin D deficiency, unspecified: Secondary | ICD-10-CM | POA: Diagnosis not present

## 2021-10-09 DIAGNOSIS — I10 Essential (primary) hypertension: Secondary | ICD-10-CM | POA: Diagnosis not present

## 2021-10-09 DIAGNOSIS — R300 Dysuria: Secondary | ICD-10-CM | POA: Diagnosis not present

## 2021-10-09 DIAGNOSIS — E782 Mixed hyperlipidemia: Secondary | ICD-10-CM | POA: Diagnosis not present

## 2021-10-10 ENCOUNTER — Encounter: Payer: Self-pay | Admitting: Oncology

## 2021-10-10 LAB — LIPID PANEL
Chol/HDL Ratio: 4.9 ratio — ABNORMAL HIGH (ref 0.0–4.4)
Cholesterol, Total: 196 mg/dL (ref 100–199)
HDL: 40 mg/dL (ref >39)
LDL Chol Calc (NIH): 113 mg/dL — ABNORMAL HIGH (ref 0–99)
Triglycerides: 248 mg/dL — ABNORMAL HIGH (ref 0–149)
VLDL Cholesterol Cal: 43 mg/dL — ABNORMAL HIGH (ref 5–40)

## 2021-10-10 LAB — CMP14+EGFR
ALT: 30 IU/L (ref 0–32)
AST: 32 IU/L (ref 0–40)
Albumin/Globulin Ratio: 1.5 (ref 1.2–2.2)
Albumin: 4.3 g/dL (ref 3.8–4.8)
Alkaline Phosphatase: 57 IU/L (ref 44–121)
BUN/Creatinine Ratio: 13 (ref 12–28)
BUN: 11 mg/dL (ref 8–27)
Bilirubin Total: 0.3 mg/dL (ref 0.0–1.2)
CO2: 21 mmol/L (ref 20–29)
Calcium: 9.8 mg/dL (ref 8.7–10.3)
Chloride: 101 mmol/L (ref 96–106)
Creatinine, Ser: 0.84 mg/dL (ref 0.57–1.00)
Globulin, Total: 2.9 g/dL (ref 1.5–4.5)
Glucose: 126 mg/dL — ABNORMAL HIGH (ref 70–99)
Potassium: 4.5 mmol/L (ref 3.5–5.2)
Sodium: 140 mmol/L (ref 134–144)
Total Protein: 7.2 g/dL (ref 6.0–8.5)
eGFR: 77 mL/min/{1.73_m2} (ref >59)

## 2021-10-10 LAB — CBC WITH DIFFERENTIAL/PLATELET
Basophils Absolute: 0.1 10*3/uL (ref 0.0–0.2)
Basos: 1 %
EOS (ABSOLUTE): 0.2 10*3/uL (ref 0.0–0.4)
Eos: 3 %
Hematocrit: 39.9 % (ref 34.0–46.6)
Hemoglobin: 13.1 g/dL (ref 11.1–15.9)
Immature Grans (Abs): 0 10*3/uL (ref 0.0–0.1)
Immature Granulocytes: 0 %
Lymphocytes Absolute: 1.6 10*3/uL (ref 0.7–3.1)
Lymphs: 30 %
MCH: 28.5 pg (ref 26.6–33.0)
MCHC: 32.8 g/dL (ref 31.5–35.7)
MCV: 87 fL (ref 79–97)
Monocytes Absolute: 0.5 10*3/uL (ref 0.1–0.9)
Monocytes: 10 %
Neutrophils Absolute: 2.9 10*3/uL (ref 1.4–7.0)
Neutrophils: 56 %
Platelets: 236 10*3/uL (ref 150–450)
RBC: 4.6 x10E6/uL (ref 3.77–5.28)
RDW: 14.3 % (ref 11.7–15.4)
WBC: 5.2 10*3/uL (ref 3.4–10.8)

## 2021-10-10 LAB — VITAMIN D 25 HYDROXY (VIT D DEFICIENCY, FRACTURES): Vit D, 25-Hydroxy: 23.3 ng/mL — ABNORMAL LOW (ref 30.0–100.0)

## 2021-10-10 LAB — TSH+FREE T4
Free T4: 1.51 ng/dL (ref 0.82–1.77)
TSH: 1.48 u[IU]/mL (ref 0.450–4.500)

## 2021-10-11 ENCOUNTER — Inpatient Hospital Stay (HOSPITAL_BASED_OUTPATIENT_CLINIC_OR_DEPARTMENT_OTHER): Payer: Medicare Other | Admitting: Oncology

## 2021-10-11 ENCOUNTER — Encounter: Payer: Self-pay | Admitting: Oncology

## 2021-10-11 ENCOUNTER — Inpatient Hospital Stay: Payer: Medicare Other | Attending: Oncology

## 2021-10-11 VITALS — BP 138/78 | HR 75 | Temp 97.0°F | Resp 18 | Wt 166.0 lb

## 2021-10-11 DIAGNOSIS — Z171 Estrogen receptor negative status [ER-]: Secondary | ICD-10-CM | POA: Diagnosis not present

## 2021-10-11 DIAGNOSIS — Z79899 Other long term (current) drug therapy: Secondary | ICD-10-CM | POA: Diagnosis not present

## 2021-10-11 DIAGNOSIS — C50411 Malignant neoplasm of upper-outer quadrant of right female breast: Secondary | ICD-10-CM | POA: Insufficient documentation

## 2021-10-11 DIAGNOSIS — G629 Polyneuropathy, unspecified: Secondary | ICD-10-CM | POA: Diagnosis not present

## 2021-10-11 DIAGNOSIS — Z95828 Presence of other vascular implants and grafts: Secondary | ICD-10-CM | POA: Diagnosis not present

## 2021-10-11 DIAGNOSIS — I890 Lymphedema, not elsewhere classified: Secondary | ICD-10-CM

## 2021-10-11 MED ORDER — SODIUM CHLORIDE 0.9% FLUSH
10.0000 mL | INTRAVENOUS | Status: DC | PRN
  Administered 2021-10-11: 10 mL via INTRAVENOUS
  Filled 2021-10-11: qty 10

## 2021-10-11 MED ORDER — HEPARIN SOD (PORK) LOCK FLUSH 100 UNIT/ML IV SOLN
500.0000 [IU] | Freq: Once | INTRAVENOUS | Status: AC
  Administered 2021-10-11: 500 [IU] via INTRAVENOUS
  Filled 2021-10-11: qty 5

## 2021-10-11 NOTE — Progress Notes (Signed)
?Hematology/Oncology Progress note ?[phone] [phone] ?  ? ? ? ?Patient Care Team: ?[name], NP as PCP - General (Nurse Practitioner) ?Kate Sable, MD as PCP - Cardiology (Cardiology) ?Rico Junker, RN as Registered Nurse ?Theodore Demark, RN as Registered Nurse ?Earlie Server, MD as Consulting Physician (Oncology) ?Noreene Filbert, MD as Referring Physician (Radiation Oncology) ?Jovita Kussmaul, MD as Consulting Physician (General Surgery) ?Earlie Server, MD as Consulting Physician (Oncology) ?Rico Junker, RN as Registered Nurse ?Alena Bills, [facility] as Pharmacist (Pharmacist) ? ?REFERRING PROVIDER: ?[name], NP  ?CHIEF COMPLAINTS/REASON FOR VISIT:  ?Follow-up for breast cancer ? ?HISTORY OF PRESENTING ILLNESS:  ?Patient had screening mammogram done 08/12/2017 which showed right breast asymmetry.  A diagnostic mammogram was suggested and patient did not have it done. ?Patient felt her right breast mass for a few weeks.  She had bilateral diagnostic mammogram done on 06/30/2019. ?2.8 x 2.4 x 2.6 cm right breast mass, 11:00, 10 cm from the nipple.  There is a single mildly abnormal node in the right axilla with a cortex measuring up to 4.4 mm.  No other suspicious findings. ?Patient underwent ultrasound-guided core biopsy of the right breast mass and right axilla lymph node ?Pathology showed invasive mammary carcinoma, no special type, grade 3, ER/PR HER-2 status are pending. ?Right axillary lymph node biopsy showed predominantly blood in the fibroadipose tissue, with scant lymphoid tissue present.  No definite malignancy was identified. ? ?Patient was referred to cancer center to establish care and discuss treatment plan. ?Menarche 28 ?Postmenopausal.  LMP when she was 66 years old. ?She recalls use of birth control pills. ?Denies any hormone .  Replacement therapy. ?Denies any prior chest radiation. ?She reports family history of maternal grandmother and 2  maternal cousins were diagnosed with cancer.  She does not know about details. ?# 09/03/2019, patient had an episode of syncope and EMS was called and patient sent to emergency room.  Patient was noted to have a heart rate 45-65 and blood glucose level of 286. ?Patient denies any history of nausea, vomiting, diarrhea.  Denies any dehydration.  She reported history of intermittent abdominal pain. ?CT head without contrast showed no acute intracranial abnormality.  CT abdomen pelvis with contrast is negative for evidence of acute abdominal abnormality.  Patient was given IV fluid. ?Patient was observed with holding her blood pressure medication including amlodipine, bisoprolol.  No arrhythmia was noted on telemetry.  Echocardiogram showed LVEF 60 to 65%.  No valvular abnormalities.  Beta-blocker and diuretics was discontinued. ? ?# Patient was seen by cardiology Dr. Garen Lah for evaluation of syncope and was cleared for chemotherapy. ?# 10/03/2019, interval unilateral right diagnostic mammogram showed right breast mass 11:00 size has decreased to 3.7 x 1.3 x 2.6 cm.  Previously mass measured 2.8 x 2.4 x 2.6 cm ? ?Neoadjuvant chemotherapy  ?08/04/2019-09/21/2019 dose dense AC ?10/11/2019-01/02/2020 weekly Taxol ?mammogram after chemotherapy showed decreased size of cancer at that time and was sent back to surgeon for surgery. ?Patient no showed to Dr. Ethlyn Gallery office in September 2021 ? ?03/16/2020 bilateral breast MRI with and without contrast showed previously biopsied mass in the posterior aspect of the upper outer quadrant of the right breast is no longer visualized.  There is a small amount of low-grade linear enhancement extending posteriorly and superiorly from the location towards the right axilla.  Measuring 5.2 x 2.0 x 1.1 cm.  No evidence of malignancy elsewhere in either breast.  No adenopathy. ? ?11/03/2019 genetic testing is  negative  ? ?# 05/04/2020 right lumpectomy SLNB ypTis ypN0 ?High-grade DCIS with  calcifications, 1 cm, no residual invasive carcinoma.  Margins not involved.  Extensive fibrosis with patchy mild chronic inflammation.  Patient had 5 sentinel lymph node excision and all lymph nodes were negative. ?Margins were negative ? ?#Adjuvant radiation finished on 08/23/2020. ?INTERVAL HISTORY ?Dawn Alvarez is a 66 y.o. female who has above history reviewed by me today presents for follow up visit for management of breast cancer. ?Patient was last seen by me on 10/22/2020, lost follow-up.  This is a reestablish care appointment. ?Patient reports feeling well.  She does breast examination and has no new breast complaints. 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 ?Her primary care provider has ordered mammogram to be done in June 2023. ?. ?Review of Systems  ?Constitutional:  Negative for appetite change, chills, fatigue and fever.  ?HENT:   Negative for hearing loss and voice change.   ?Eyes:  Negative for eye problems.  ?Respiratory:  Negative for chest tightness and cough.   ?Cardiovascular:  Negative for chest pain.  ?Gastrointestinal:  Negative for abdominal distention, abdominal pain and blood in stool.  ?Endocrine: Negative for hot flashes.  ?Genitourinary:  Negative for difficulty urinating and frequency.   ?Musculoskeletal:  Positive for arthralgias.  ?Skin:  Negative for itching and rash.  ?Neurological:  Positive for numbness. Negative for extremity weakness.  ?Hematological:  Negative for adenopathy.  ?Psychiatric/Behavioral:  Negative for confusion.   ? ?MEDICAL HISTORY:  ?Past Medical History:  ?Diagnosis Date  ? Asthma   ? Cancer ([place]) 03/2020  ? right breast IMC  ? COPD exacerbation ([place]) 04/12/2016  ? Diabetes mellitus without complication ([place])   ? Hyperlipemia   ? Hypertension   ? Personal history of chemotherapy 2021  ? ? ?SURGICAL HISTORY: ?Past Surgical History:  ?Procedure Laterality Date  ? BREAST BIOPSY Right 07/05/2019  ? [place] bx venus marker, grade 3 invasive mammary carcinoma   ? BREAST BIOPSY Right 07/05/2019  ? LN bx, hydromarker,PREDOMINANTLY BLOOD AND FIBROADIPOSE TISSUE, WITH SCANT LYMPHOID TISSUE PRESENT      ? BREAST LUMPECTOMY Right 05/04/2020  ? 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 BREAST LUMPECTOMY WITH RADIOACTIVE SEED AND SENTINEL LYMPH NODE BIOPSY Right 05/04/2020  ? Procedure: RIGHT BREAST LUMPECTOMY WITH BRACKETED RADIOACTIVE SEED AND SENTINEL LYMPH NODE BIOPSY;  Surgeon: Jovita Kussmaul, MD;  Location: [place];  Service: General;  Laterality: Right;  ? CESAREAN SECTION    ? COLONOSCOPY WITH PROPOFOL N/A 09/11/2015  ? Procedure: COLONOSCOPY WITH PROPOFOL;  Surgeon: Lucilla Lame, MD;  Location: [facility];  Service: Endoscopy;  Laterality: N/A;  ? IR IMAGING GUIDED PORT INSERTION  07/20/2019  ? ? ?SOCIAL HISTORY: ?Social History  ? ?Socioeconomic History  ? Marital status: Married  ?  Spouse name: Not on file  ? Number of children: Not on file  ? Years of education: Not on file  ? Highest education level: Not on file  ?Occupational History  ? Not on file  ?Tobacco Use  ? Smoking status: Former  ?  Packs/day: 1.00  ?  Years: 2.00  ?  Pack years: 2.00  ?  Types: Cigarettes  ?  Quit date: 02/29/2020  ?  Years since quitting: 1.6  ? Smokeless tobacco: Never  ?Substance and Sexual Activity  ? Alcohol use: Yes  ?  Comment: social  ? Drug use: No  ? Sexual activity: Not Currently  ?  Birth  control/protection: Post-menopausal  ?Other Topics Concern  ? Not on file  ?Social History Narrative  ? Not on file  ? ?Social Determinants of Health  ? ?Financial Resource Strain: Low Risk   ? Difficulty of Paying Living Expenses: Not very hard  ?Food Insecurity: Not on file  ?Transportation Needs: Not on file  ?Physical Activity: Not on file  ?Stress: Not on file  ?Social Connections: Not on file  ?Intimate Partner Violence: Not on file  ? ? ?FAMILY HISTORY: ?Family History  ?Problem Relation Age of Onset  ? Diabetes Mother   ?  Hypertension Mother   ? Hypertension Father   ? Diabetes Father   ? Breast cancer Neg Hx   ? ? ?ALLERGIES:  has No Known Allergies. ? ?MEDICATIONS:  ?Current Outpatient Medications  ?Medication Sig Dispense Refill  ? Alcohol Swa

## 2021-10-11 NOTE — Progress Notes (Signed)
Pt here for follow up. No new breast concerns.  ?

## 2021-10-23 ENCOUNTER — Inpatient Hospital Stay: Payer: Medicare Other | Admitting: Occupational Therapy

## 2021-10-30 ENCOUNTER — Telehealth: Payer: Self-pay | Admitting: Medical Oncology

## 2021-10-30 ENCOUNTER — Inpatient Hospital Stay: Payer: Medicare Other | Admitting: Occupational Therapy

## 2021-10-30 NOTE — Telephone Encounter (Signed)
S1714 - A Prospective Observational Cohort Study to Develop a Predictive Model of Taxane-Induced Peripheral Neuropathy in Cancer Patients   Outgoing call: LVMOM w/patient @ 1430 Call to patient regarding 104 weeks assessment. Informed patient that study assessment is due at this time and asked patient for return call, to complete these study assessments. Informed patient phone-call assessment includes questions on any neuropathy symptoms she may be having, and call may take 20-30 minutes due to the questionnaire.  Patient thanked, call back number provided.   Maxwell Marion, RN, BSN, [place] Clinical Research Nurse Lead 10/30/2021 2:36 PM

## 2021-11-04 ENCOUNTER — Telehealth: Payer: Self-pay | Admitting: Medical Oncology

## 2021-11-04 NOTE — Telephone Encounter (Signed)
S1714 - A Prospective Observational Cohort Study to Develop a Predictive Model of Taxane-Induced Peripheral Neuropathy in Cancer Patients   Outgoing call: 104 Week study assessment.   Spoke with patient regarding time for neuropathy and questionnaire assessments. I introduced myself and inquired with patient if she felt comfortable and had fifteen-twenty minutes to complete study questionnaires over the phone with me. Patient confirmed and gave me her verbal consent to complete the questions over the phone. This nurse read over the questionnaires and patient provided her response. All required study questionnaires have been completed for this time point.    Patient states that she does have "quite a bit" of neuropathy to bilateral hands and fingers and toes and feet. Patient reports that tingling and numbness are her main concerns. Patient confirms that it does not interfere with her daily living. Patient was assessed by MD (see 10/11/21 note) last month and patient to continue gabapentin 100 mg 3 times daily.   I thanked patient for her time today and for her continued contribution to the study. Patient denies any questions at this time. Patient encouraged to call with questions.  Patient was informed next study assessment will be at 156 weeks, in one year's time.   Maxwell Marion, RN, BSN, [place] Clinical Research Nurse Lead 11/04/2021 4:18 PM

## 2021-11-13 ENCOUNTER — Inpatient Hospital Stay: Payer: Medicare Other | Attending: Oncology | Admitting: Occupational Therapy

## 2021-11-13 DIAGNOSIS — I890 Lymphedema, not elsewhere classified: Secondary | ICD-10-CM

## 2021-11-13 NOTE — Therapy (Signed)
[place] [facility] at [facility] [address], [place], [place], [address] Phone: [phone]   Fax:  [phone]  Occupational Therapy Screen:  Patient Details  Name: Dawn Alvarez MRN: [id number] Date of Birth: [birth year] No data recorded  Encounter Date: 11/13/2021   OT End of Session - 11/13/21 1337     Visit Number 0             Past Medical History:  Diagnosis Date   Asthma    Cancer ([place]) 03/2020   right breast [facility]   COPD exacerbation ([place]) 04/12/2016   Diabetes mellitus without complication ([place])    Hyperlipemia    Hypertension    Personal history of chemotherapy 2021    Past Surgical History:  Procedure Laterality Date   BREAST BIOPSY Right 07/05/2019   [place] bx venus marker, grade 3 invasive mammary carcinoma   BREAST BIOPSY Right 07/05/2019   LN bx, hydromarker,PREDOMINANTLY BLOOD AND FIBROADIPOSE TISSUE, WITH SCANT LYMPHOID TISSUE PRESENT       BREAST LUMPECTOMY Right 05/04/2020   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   Subjective Assessment - 11/13/21 1335     Subjective  Dr. Tasia Catchings referred me to you please my right breast and on the my arm I have sometimes increased swelling depending on what I do.  As well as hard area on side of breast and around the scar tissue -  that is tender    Currently in  Pain? Yes    Pain Score 2     Pain Location Breast    Pain Orientation Right    Pain Descriptors / Indicators Tender;Tightness                 LYMPHEDEMA/ONCOLOGY QUESTIONNAIRE - 11/13/21 0001       Right Upper Extremity Lymphedema   15 cm Proximal to Olecranon Process 30.4 cm    10 cm Proximal to Olecranon Process 30.2 cm    Olecranon Process 24.2 cm      Left Upper Extremity Lymphedema   15 cm Proximal to Olecranon Process 32.4 cm    10 cm Proximal to Olecranon Process 30.8 cm    Olecranon Process 24.8 cm             Earlie Server, MD, PhD Hematology Oncology 10/11/2021 ASSESSMENT & PLAN:  1. Malignant neoplasm of upper-outer quadrant of right breast in female, estrogen receptor negative ([place])   2. Port-A-Cath in place   3. Neuropathy   4. 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  ER negative, PR weakly positive (<=10%), HER-2 negative Status post neoadjuvant dose dense AC followed by 12 weekly Taxol. Status post lumpectomy/sentinel lymph node biopsy ypTis ypN0 No residual invasive carcinoma.  Status post adjuvant radiation. Continue annual mammogram-ordered about primary care provider   Right breast  lymphedema, skin hyperpigmentation Refer to physical therapy/lymphedema clinic.     #Chemotherapy induced neuropathy, grade 1-2.  Symptoms are stable and slightly improved.  Continue gabapentin 100 mg 3 times daily  #Port-A-Cath, we discussed the option of removal Mediport.  Patient prefers to keep Mediport until next visit and decide. Continue port flush every 8 weeks     All questions were answered. The patient knows to call the clinic with any problems questions or concerns. Return of visit 6 months     OT SCREEN 11/13/21: Patient referred to OT services for screening for lymphedema of right breast.  Patient reports she had a lumpectomy as well as radiation.  Patient reports increased swelling at times under the arm on  the thoracic as well as right breast depending on what she does.  Patient report tenderness over scar tissue on the lateral right breast as well as hard areas on the right side of the right breast that is tender. Patient with an area of scar tissue and fibrosis from 7:00 to 9:00 on right breast.  Did mini massager this date as well as educated patient on soft tissue massage to do twice a day for about 2 minutes.  As well as scar tissue massage.  Patient responded greatly on soft tissue massage the session. Patient is left-handed and do not work outside the house.  That is mostly cleaning around the house as well as cooking and laundry some light yard work.  To report increased swelling after using her arm a lot the day. Patient was educated on signs and symptoms of lymphedema as well as handout provided.  Also recommended for patient to get Jovi pack unilateral postmastectomy pad to wear under a camisole or bra.  Patient can pick it up at [facility] and will get the order sent to [facility].  Patient to work when feeling increased swelling after a busy day at home cleaning, cooking, laundry or yard work. Patient to follow-up with me in 2 to 3 weeks to reassess.                                   Visit Diagnosis: Lymphedema of breast    Problem List Patient Active Problem List   Diagnosis Date Noted   Neuropathy 03/22/2020   Port-A-Cath in place 03/22/2020   Antineoplastic chemotherapy induced anemia 11/17/2019   Encounter for antineoplastic chemotherapy 11/17/2019   Genetic testing 11/14/2019   Sinus bradycardia 09/03/2019   Hypokalemia 09/03/2019   Dehydration 08/04/2019   Goals of care, counseling/discussion 07/27/2019   Malignant neoplasm of upper-outer quadrant of right breast in female, estrogen receptor negative ([place]) 07/27/2019   Malignant neoplasm of upper-outer quadrant of right female breast ([place]) 07/08/2019   Mass of soft tissue of chest 06/18/2019   Mass  of axilla, left 06/18/2019   Encounter for screening mammogram for malignant neoplasm of breast 06/18/2019   Visual changes 06/18/2019   Goiter diffuse, nontoxic 07/21/2018   Acute upper respiratory infection 04/15/2018   Mixed hyperlipidemia 01/10/2018   Dysuria 10/11/2017   Headache, acute 06/24/2017   Type 2 diabetes mellitus with hyperglycemia, with long-term current use of insulin ([place]) 06/23/2017   Essential hypertension 06/23/2017   Syncope 04/12/2016   Dizziness and giddiness 04/12/2016   Acute bronchitis 04/12/2016   COPD exacerbation ([place]) 04/12/2016   Tobacco abuse counseling 04/12/2016   Atypical chest pain 04/11/2016   Encounter for general adult medical examination  with abnormal findings    Benign neoplasm of sigmoid colon     Rosalyn Gess, OTR/L,CLT 11/13/2021, 1:38 PM  [place] at [facility] [address], [place], [place], [address] Phone: [phone]   Fax:  [phone]  Name: SHIVAUN BILELLO MRN: [id number] Date of Birth: 1955-08-22

## 2021-11-16 ENCOUNTER — Other Ambulatory Visit: Payer: Self-pay | Admitting: Nurse Practitioner

## 2021-11-17 ENCOUNTER — Encounter: Payer: Self-pay | Admitting: Nurse Practitioner

## 2021-11-19 ENCOUNTER — Ambulatory Visit
Admission: RE | Admit: 2021-11-19 | Discharge: 2021-11-19 | Disposition: A | Discharge status: Home / Self Care | Payer: Medicare Other | Source: Ambulatory Visit | Attending: Nurse Practitioner | Admitting: Nurse Practitioner

## 2021-11-19 DIAGNOSIS — Z1231 Encounter for screening mammogram for malignant neoplasm of breast: Secondary | ICD-10-CM

## 2021-11-19 DIAGNOSIS — Z853 Personal history of malignant neoplasm of breast: Secondary | ICD-10-CM | POA: Diagnosis not present

## 2021-11-19 DIAGNOSIS — Z780 Asymptomatic menopausal state: Secondary | ICD-10-CM

## 2021-11-20 ENCOUNTER — Other Ambulatory Visit: Payer: Self-pay

## 2021-11-20 DIAGNOSIS — M10072 Idiopathic gout, left ankle and foot: Secondary | ICD-10-CM

## 2021-11-20 DIAGNOSIS — I10 Essential (primary) hypertension: Secondary | ICD-10-CM

## 2021-11-20 DIAGNOSIS — E1165 Type 2 diabetes mellitus with hyperglycemia: Secondary | ICD-10-CM

## 2021-11-21 ENCOUNTER — Other Ambulatory Visit: Payer: Self-pay

## 2021-11-21 DIAGNOSIS — M10072 Idiopathic gout, left ankle and foot: Secondary | ICD-10-CM

## 2021-11-21 DIAGNOSIS — E1165 Type 2 diabetes mellitus with hyperglycemia: Secondary | ICD-10-CM

## 2021-11-21 DIAGNOSIS — I10 Essential (primary) hypertension: Secondary | ICD-10-CM

## 2021-11-21 MED ORDER — BRIMONIDINE TARTRATE 0.2 % OP SOLN: OPHTHALMIC | 1 refills | Status: DC

## 2021-11-21 MED ORDER — LEVEMIR FLEXTOUCH 100 UNIT/ML ~~LOC~~ SOPN: 44.0000 [IU] | PEN_INJECTOR | Freq: Every day | SUBCUTANEOUS | 1 refills | Status: DC

## 2021-11-21 MED ORDER — PEN NEEDLES 31G X 8 MM MISC: 1 refills | Status: DC

## 2021-11-21 MED ORDER — COLCHICINE 0.6 MG PO TABS: ORAL_TABLET | ORAL | 1 refills | Status: DC

## 2021-11-21 MED ORDER — LISINOPRIL 30 MG PO TABS: ORAL_TABLET | ORAL | 1 refills | Status: DC

## 2021-12-06 ENCOUNTER — Inpatient Hospital Stay: Payer: Medicare Other | Attending: Oncology

## 2021-12-06 DIAGNOSIS — C50411 Malignant neoplasm of upper-outer quadrant of right female breast: Secondary | ICD-10-CM | POA: Insufficient documentation

## 2021-12-06 DIAGNOSIS — Z171 Estrogen receptor negative status [ER-]: Secondary | ICD-10-CM | POA: Diagnosis not present

## 2021-12-06 DIAGNOSIS — Z452 Encounter for adjustment and management of vascular access device: Secondary | ICD-10-CM | POA: Insufficient documentation

## 2021-12-06 DIAGNOSIS — Z95828 Presence of other vascular implants and grafts: Secondary | ICD-10-CM

## 2021-12-06 MED ORDER — SODIUM CHLORIDE 0.9% FLUSH
10.0000 mL | Freq: Once | INTRAVENOUS | Status: AC
  Administered 2021-12-06: 10 mL via INTRAVENOUS
  Filled 2021-12-06: qty 10

## 2021-12-06 MED ORDER — HEPARIN SOD (PORK) LOCK FLUSH 100 UNIT/ML IV SOLN
500.0000 [IU] | Freq: Once | INTRAVENOUS | Status: AC
  Administered 2021-12-06: 500 [IU] via INTRAVENOUS
  Filled 2021-12-06: qty 5

## 2021-12-16 ENCOUNTER — Telehealth: Payer: Self-pay

## 2021-12-16 NOTE — Telephone Encounter (Signed)
Lmom that we returning her call she left message this morning

## 2021-12-23 ENCOUNTER — Ambulatory Visit (INDEPENDENT_AMBULATORY_CARE_PROVIDER_SITE_OTHER): Payer: Medicare Other | Admitting: Nurse Practitioner

## 2021-12-23 ENCOUNTER — Encounter: Payer: Self-pay | Admitting: Nurse Practitioner

## 2021-12-23 VITALS — BP 160/88 | HR 84 | Temp 98.2°F | Resp 16 | Ht 61.0 in | Wt 157.0 lb

## 2021-12-23 DIAGNOSIS — E1165 Type 2 diabetes mellitus with hyperglycemia: Secondary | ICD-10-CM | POA: Diagnosis not present

## 2021-12-23 DIAGNOSIS — I10 Essential (primary) hypertension: Secondary | ICD-10-CM

## 2021-12-23 DIAGNOSIS — Z794 Long term (current) use of insulin: Secondary | ICD-10-CM | POA: Diagnosis not present

## 2021-12-23 DIAGNOSIS — Z716 Tobacco abuse counseling: Secondary | ICD-10-CM | POA: Diagnosis not present

## 2021-12-23 LAB — POCT GLYCOSYLATED HEMOGLOBIN (HGB A1C): Hemoglobin A1C: 5.8 % — AB (ref 4.0–5.6)

## 2021-12-23 MED ORDER — LEVEMIR FLEXTOUCH 100 UNIT/ML ~~LOC~~ SOPN
30.0000 [IU] | PEN_INJECTOR | Freq: Every day | SUBCUTANEOUS | 1 refills | Status: DC
Start: 2021-12-23 — End: 2022-06-24

## 2021-12-23 MED ORDER — OZEMPIC (1 MG/DOSE) 2 MG/1.5ML ~~LOC~~ SOPN: 1.0000 mg | PEN_INJECTOR | SUBCUTANEOUS | 6 refills | Status: DC

## 2021-12-23 MED ORDER — LISINOPRIL 40 MG PO TABS: 40.0000 mg | ORAL_TABLET | Freq: Every day | ORAL | 0 refills | Status: DC

## 2021-12-23 NOTE — Progress Notes (Unsigned)
[facility] [place], [place] [address]  Internal MEDICINE  Office Visit Note  Patient Name: Dawn Alvarez  [id number]  [id number]  Date of Service: 12/23/2021  Chief Complaint  Patient presents with   Follow-up   Diabetes   Hypertension   Hyperlipidemia   Quality Metric Gaps    Shingles Vaccine    HPI Dawn Alvarez presents for follow-up visit for diabetes, hypertension, hyperlipidemia, COPD, and medication refills.  Her A1c is 5.8 today which is no change from April this year. Her A1c is stable and consistent and her glucose levels have remained under 140 on average.  She is still on 44 units of Levemir once daily and ozempic 1 mg weekly.   Her blood pressure is elevated and remained elevated when rechecked, see vitals. She may need an adjustment of her blood pressure medications.  She continues to take colchicine for gout flareups and she states they might happen a few times a year so she does not need a daily preventative at this time. At her previous office visit we discussed smoking cessation and she was interested in trying the nicotine gum for NRT. She was not able to get the gum due to her pharmacy being completely out of stock. She reports that she is only smoking 1 cigarette per day which is significantly less than 1 ppd. After discussing it with her, she agrees that she does not need any NRT supplements to completely quit smoking.       Current Medication: Outpatient Encounter Medications as of 12/23/2021  Medication Sig   Alcohol Swabs (B-D SINGLE USE SWABS REGULAR) PADS Use as directed twice a day   allopurinol (ZYLOPRIM) 100 MG tablet Take 1 tablet (100 mg total) by mouth at bedtime. Take 1 tablet by mouth at night for Gout   amLODipine (NORVASC) 10 MG tablet Take 1 tablet (10 mg total) by mouth daily.   atorvastatin (LIPITOR) 20 MG tablet Take 1 tablet (20 mg total) by mouth daily.   Blood Glucose Monitoring Suppl (TRUE METRIX AIR GLUCOSE METER)  w/Device KIT 1 Device by Does not apply route in the morning and at bedtime. Use ad directed e11.65   brimonidine (ALPHAGAN) 0.2 % ophthalmic solution INSTILL 1 DROP INTO EACH EYE TWICE DAILY   Calcium Carb-Cholecalciferol (CALCIUM 600 + D) 600-200 MG-UNIT TABS Take 1 tablet by mouth daily.   celecoxib (CELEBREX) 200 MG capsule Take one tab po qd for one week and then as needed   colchicine 0.6 MG tablet For a gout flare/attack: take 2 tablets by mouth then take 1 more tablet 1 hour later on day 1, then take 1 tablet by mouth daily until it resolves.   cyclobenzaprine (FLEXERIL) 10 MG tablet Take 1 tablet (10 mg total) by mouth at bedtime as needed for muscle spasms.   gabapentin (NEURONTIN) 300 MG capsule Take 1 capsule (300 mg total) by mouth 3 (three) times daily.   glucose blood (TRUE METRIX BLOOD GLUCOSE TEST) test strip Use as instructed twice a daily diag e11.65   HYDROcodone-acetaminophen (NORCO/VICODIN) 5-325 MG tablet Take 1 tablet by mouth every 6 (six) hours as needed for moderate pain or severe pain.   Insulin Pen Needle (PEN NEEDLES) 31G X 8 MM MISC Use as directed with insulin E11.65   Lancets Misc. (ACCU-CHEK MULTICLIX LANCET DEV) KIT by Does not apply route. Use as directed twice a day diag E11.65   lidocaine-prilocaine (EMLA) cream SMARTSIG:1 Topical Every Night   lisinopril (ZESTRIL)  40 MG tablet Take 1 tablet (40 mg total) by mouth daily.   nicotine polacrilex ([employer] NICOTINE) 2 MG gum Take 1 each (2 mg total) by mouth as needed for smoking cessation.   Omega-3 Fatty Acids (FISH OIL) 1000 MG CAPS Take 1,000 mg by mouth daily.    SIMBRINZA 1-0.2 % SUSP    TRUEplus Lancets 30G MISC Use as directed twice daily diag e11.65   [DISCONTINUED] insulin detemir (LEVEMIR FLEXTOUCH) 100 UNIT/ML FlexPen Inject 44 Units into the skin daily. ALONG WITH NEEDLES   [DISCONTINUED] lisinopril (ZESTRIL) 30 MG tablet Take one tab po qd for HTN   [DISCONTINUED] Semaglutide, 1 MG/DOSE, (OZEMPIC, 1  MG/DOSE,) 2 MG/1.5ML SOPN Inject 1 mg into the skin once a week. FOR DM   insulin detemir (LEVEMIR FLEXTOUCH) 100 UNIT/ML FlexPen Inject 30 Units into the skin daily. ALONG WITH NEEDLES   Semaglutide, 1 MG/DOSE, (OZEMPIC, 1 MG/DOSE,) 2 MG/1.5ML SOPN Inject 1 mg into the skin once a week. 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   BREAST LUMPECTOMY WITH RADIOACTIVE SEED AND SENTINEL LYMPH NODE BIOPSY Right 05/04/2020   Procedure: RIGHT BREAST LUMPECTOMY WITH BRACKETED RADIOACTIVE SEED AND SENTINEL LYMPH NODE BIOPSY;  Surgeon: Jovita Kussmaul, MD;  Location: [place];  Service: General;  Laterality: Right;   CESAREAN SECTION     COLONOSCOPY WITH PROPOFOL N/A 09/11/2015   Procedure: COLONOSCOPY WITH PROPOFOL;  Surgeon: Lucilla Lame, MD;  Location: [facility] ENDOSCOPY;  Service: Endoscopy;  Laterality: N/A;   IR IMAGING GUIDED PORT INSERTION  07/20/2019    Medical History: Past Medical History:  Diagnosis Date   Asthma    Cancer ([place]) 03/2020   right breast IMC   COPD exacerbation ([place]) 04/12/2016   Diabetes mellitus without complication (HCC)    Hyperlipemia    Hypertension    Personal history of chemotherapy 2021    Family History: Family History  Problem Relation Age of Onset   Diabetes Mother    Hypertension Mother    Hypertension Father    Diabetes Father    Breast cancer Neg Hx     Social History   Socioeconomic History    Marital status: Married    Spouse name: Not on file   Number of children: Not on file   Years of education: Not on file   Highest education level: Not on file  Occupational History   Not on file  Tobacco Use   Smoking status: Former    Packs/day: 1.00    Years: 2.00    Total pack years: 2.00    Types: Cigarettes    Quit date: 02/29/2020    Years since quitting: 1.8   Smokeless tobacco: Never  Substance and Sexual Activity   Alcohol use: Yes    Comment: social   Drug use: No   Sexual activity: Not Currently    Birth control/protection: Post-menopausal  Other Topics Concern   Not on file  Social History Narrative   Not on file   Social Determinants of Health   Financial Resource Strain: Low Risk  (10/24/2020)   Overall Financial Resource Strain (CARDIA)    Difficulty of Paying Living Expenses: Not very hard  Food Insecurity: Not on file  Transportation Needs: Not on file  Physical Activity: Not on file  Stress: Not on file  Social Connections: Not on file  Intimate Partner Violence: Not on file      Review of Systems  Constitutional:  Negative for chills, fatigue and unexpected weight change.  HENT:  Negative for congestion, rhinorrhea, sneezing and sore throat.   Eyes:  Negative for redness.  Respiratory:  Negative for cough, chest tightness and shortness of breath.   Cardiovascular:  Negative for chest pain and palpitations.  Gastrointestinal:  Negative for abdominal pain, constipation, diarrhea, nausea and vomiting.  Genitourinary:  Negative for dysuria and frequency.  Musculoskeletal:  Negative for arthralgias, back pain, joint swelling and neck pain.  Skin:  Negative for rash.  Neurological: Negative.  Negative for tremors and numbness.  Hematological:  Negative for adenopathy. Does not bruise/bleed easily.  Psychiatric/Behavioral:  Negative for behavioral problems (Depression), sleep disturbance and suicidal ideas. The patient is not nervous/anxious.      Vital Signs: BP (!) 160/88 Comment: 172/100  Pulse 84   Temp 98.2 F (36.8 C)   Resp 16   Ht $R'5\' 1"'AF$  (1.549 m)   Wt 157 lb (71.2 kg)   SpO2 98%   BMI 29.66 kg/m    Physical Exam Vitals reviewed.  Constitutional:      Appearance: Normal appearance.  HENT:     Head: Normocephalic and atraumatic.  Eyes:     Pupils: Pupils are equal, round, and reactive to light.  Cardiovascular:     Rate and Rhythm: Normal rate and regular rhythm.  Pulmonary:     Effort: Pulmonary effort is normal. No respiratory distress.  Neurological:     Mental Status: She is alert.  Psychiatric:        Mood and Affect: Mood normal.        Behavior: Behavior normal.        Assessment/Plan: 1. Type 2 diabetes mellitus with hyperglycemia, with long-term current use of insulin (HCC) A1c is stable and consistent with no change, remains at 5.8. ozempic refills ordered. Discussed decreasing levemir dose and patient is agreeable to the plan. Patient instructed to check her glucose at least once daily and record them to keep track while her medications are being adjusted. Levemir dose decreased to 30 units daily. Follow up in 1 month to discuss medication adjustment and evaluate if her levemir dose can be titrated down more.  - POCT HgB A1C - Semaglutide, 1 MG/DOSE, (OZEMPIC, 1 MG/DOSE,) 2 MG/1.5ML SOPN; Inject 1 mg into the skin once a week. FOR DM  Dispense: 6 mL; Refill: 6 - insulin detemir (LEVEMIR FLEXTOUCH) 100 UNIT/ML FlexPen; Inject 30 Units into the skin daily. ALONG WITH NEEDLES  Dispense: 90 mL; Refill: 1  2. Essential hypertension Lisinopril dose increased to 40 mg daily. Will follow up in 1 month to evaluate dose adjustment.  - lisinopril (ZESTRIL) 40 MG tablet; Take 1 tablet (40 mg total) by mouth daily.  Dispense: 90 tablet; Refill: 0  3. Tobacco abuse counseling Patient has decreased to 1 cigarette per day. Patient agrees that she does not need NRT at this time. She is motivated to stop  the last cigarette on her own but will call  the clinic if she decides she needs additional assistance. Will reassess in 1 month.    General Counseling: Dawn Alvarez verbalizes understanding of the findings of todays visit and agrees with plan of treatment. I have discussed any further diagnostic evaluation that may be needed or ordered today. We also reviewed her medications today. she has been encouraged to call the office with any questions or concerns that should arise related to todays visit.    Orders Placed This Encounter  Procedures   POCT HgB A1C    Meds ordered this encounter  Medications   Semaglutide, 1 MG/DOSE, (OZEMPIC, 1 MG/DOSE,) 2 MG/1.5ML SOPN    Sig: Inject 1 mg into the skin once a week. FOR DM    Dispense:  6 mL    Refill:  6    E11.65. please fill for 90 days if possible.   insulin detemir (LEVEMIR FLEXTOUCH) 100 UNIT/ML FlexPen    Sig: Inject 30 Units into the skin daily. ALONG WITH NEEDLES    Dispense:  90 mL    Refill:  1   lisinopril (ZESTRIL) 40 MG tablet    Sig: Take 1 tablet (40 mg total) by mouth daily.    Dispense:  90 tablet    Refill:  0    Note increased dose, discontinue lisinopril 30 mg and fill new prescription asap. Thanks.    Return in about 1 month (around 01/23/2022) for F/U, BP check, Dawn Alvarez PCP BP and insulin dose adjustments. .   Total time spent:30 Minutes Time spent includes review of chart, medications, test results, and follow up plan with the patient.   [place] Controlled Substance Database was reviewed by me.  This patient was seen by [name], FNP-C in collaboration with Dr. Clayborn Bigness as a part of collaborative care agreement.   Dawn Magner R. Valetta Fuller, MSN, FNP-C Internal medicine

## 2021-12-23 NOTE — Patient Instructions (Signed)
Levemir insulin dose decreased to 30 units daily.   Stop lisinopril 30 mg, and pick up lisinopril 40 mg from [facility] and start tomorrow on 12/24/21

## 2022-01-20 DIAGNOSIS — H40003 Preglaucoma, unspecified, bilateral: Secondary | ICD-10-CM | POA: Diagnosis not present

## 2022-01-20 DIAGNOSIS — H2513 Age-related nuclear cataract, bilateral: Secondary | ICD-10-CM | POA: Diagnosis not present

## 2022-01-23 DIAGNOSIS — J449 Chronic obstructive pulmonary disease, unspecified: Secondary | ICD-10-CM | POA: Diagnosis not present

## 2022-01-23 DIAGNOSIS — E119 Type 2 diabetes mellitus without complications: Secondary | ICD-10-CM | POA: Insufficient documentation

## 2022-01-23 DIAGNOSIS — I10 Essential (primary) hypertension: Secondary | ICD-10-CM | POA: Diagnosis not present

## 2022-01-23 DIAGNOSIS — R0789 Other chest pain: Secondary | ICD-10-CM | POA: Diagnosis not present

## 2022-01-23 DIAGNOSIS — R079 Chest pain, unspecified: Secondary | ICD-10-CM | POA: Diagnosis not present

## 2022-01-24 ENCOUNTER — Emergency Department
Admission: EM | Admit: 2022-01-24 | Discharge: 2022-01-24 | Disposition: A | Discharge status: Home / Self Care | Payer: Medicare Other | Attending: Emergency Medicine | Admitting: Emergency Medicine

## 2022-01-24 ENCOUNTER — Emergency Department: Payer: Medicare Other

## 2022-01-24 ENCOUNTER — Other Ambulatory Visit: Payer: Self-pay

## 2022-01-24 DIAGNOSIS — I10 Essential (primary) hypertension: Secondary | ICD-10-CM | POA: Diagnosis not present

## 2022-01-24 DIAGNOSIS — R079 Chest pain, unspecified: Secondary | ICD-10-CM | POA: Diagnosis not present

## 2022-01-24 DIAGNOSIS — R0789 Other chest pain: Secondary | ICD-10-CM

## 2022-01-24 LAB — COMPREHENSIVE METABOLIC PANEL
ALT: 33 U/L (ref 0–44)
AST: 39 U/L (ref 15–41)
Albumin: 4.5 g/dL (ref 3.5–5.0)
Alkaline Phosphatase: 52 U/L (ref 38–126)
Anion gap: 11 (ref 5–15)
BUN: 12 mg/dL (ref 8–23)
CO2: 24 mmol/L (ref 22–32)
Calcium: 10.1 mg/dL (ref 8.9–10.3)
Chloride: 102 mmol/L (ref 98–111)
Creatinine, Ser: 0.87 mg/dL (ref 0.44–1.00)
GFR, Estimated: >60 mL/min (ref >60)
Glucose, Bld: 108 mg/dL — ABNORMAL HIGH (ref 70–99)
Potassium: 3.9 mmol/L (ref 3.5–5.1)
Sodium: 137 mmol/L (ref 135–145)
Total Bilirubin: 1 mg/dL (ref 0.3–1.2)
Total Protein: 8.5 g/dL — ABNORMAL HIGH (ref 6.5–8.1)

## 2022-01-24 LAB — CBC
HCT: 43.3 % (ref 36.0–46.0)
Hemoglobin: 14.4 g/dL (ref 12.0–15.0)
MCH: 28.7 pg (ref 26.0–34.0)
MCHC: 33.3 g/dL (ref 30.0–36.0)
MCV: 86.4 fL (ref 80.0–100.0)
Platelets: 255 10*3/uL (ref 150–400)
RBC: 5.01 MIL/uL (ref 3.87–5.11)
RDW: 14.8 % (ref 11.5–15.5)
WBC: 7.2 10*3/uL (ref 4.0–10.5)
nRBC: 0 % (ref 0.0–0.2)

## 2022-01-24 LAB — TROPONIN I (HIGH SENSITIVITY)
Troponin I (High Sensitivity): 10 ng/L (ref <18)
Troponin I (High Sensitivity): 12 ng/L (ref <18)

## 2022-01-24 LAB — LIPASE, BLOOD: Lipase: 37 U/L (ref 11–51)

## 2022-01-24 NOTE — ED Provider Notes (Signed)
[facility] Provider Note    Event Date/Time   First MD Initiated Contact with Patient 01/24/22 0141     (approximate)   History   Chest Pain   HPI  Dawn Alvarez is a 66 y.o. female with history of diabetes, hypertension, hyperlipidemia, and COPD who presents with chest pain, acute onset a couple of hours ago around 14 PM.  The patient states she was lying in bed when it started.  The pain was sharp and mainly located in the right upper chest.  It has now almost completely resolved.  She denies associated shortness of breath, lightheadedness, nausea or vomiting, or other acute symptoms.    Physical Exam   Triage Vital Signs: ED Triage Vitals  Enc Vitals Group     BP 01/24/22 0004 (!) 203/94     Pulse Rate 01/24/22 0004 84     Resp 01/24/22 0004 18     Temp 01/24/22 0004 97.8 F (36.6 C)     Temp Source 01/24/22 0004 Oral     SpO2 01/24/22 0004 98 %     Weight 01/24/22 0006 156 lb 8.4 oz (71 kg)     Height 01/24/22 0006 '5\' 1"'$  (1.549 m)     Head Circumference --      Peak Flow --      Pain Score 01/24/22 0006 10     Pain Loc --      Pain Edu? --      Excl. in [place]? --     Most recent vital signs: Vitals:   01/24/22 0230 01/24/22 0333  BP: (!) 177/100 (!) 176/98  Pulse: 75 73  Resp: 16 15  Temp:    SpO2: 100% 99%    General: And oriented, well-appearing. CV:  Good peripheral perfusion.  Resp:  Normal effort.  Abd:  No distention.  Other:  No significant peripheral edema.  Mild right chest wall tenderness reproducing the pain.   ED Results / Procedures / Treatments   Labs (all labs ordered are listed, but only abnormal results are displayed) Labs Reviewed  COMPREHENSIVE METABOLIC PANEL - Abnormal; Notable for the following components:      Result Value   Glucose, Bld 108 (*)    Total Protein 8.5 (*)    All other components within normal limits  CBC  LIPASE, BLOOD  TROPONIN I (HIGH SENSITIVITY)  TROPONIN I (HIGH  SENSITIVITY)     EKG  ED ECG REPORT I, Arta Silence, the attending physician, personally viewed and interpreted this ECG.  Date: 01/24/2022 EKG Time: 1200 Rate: 76 Rhythm: normal sinus rhythm QRS Axis: normal Intervals: normal ST/T Wave abnormalities: normal Narrative Interpretation: no evidence of acute ischemia    RADIOLOGY  Chest x-ray: I independently viewed and interpreted the images; there is no focal consolidation or edema  PROCEDURES:  Critical Care performed: No  Procedures   MEDICATIONS ORDERED IN ED: Medications - No data to display   IMPRESSION / MDM / [place] / ED COURSE  I reviewed the triage vital signs and the nursing notes.  66 year old female with PMH as noted above presents with an episode of atypical right-sided chest pain which has now almost completely resolved.  I reviewed the past medical records.  The patient was most recently evaluated by her primary care provider Abernathy on 12/23/2021 and had elevated blood pressure at that time.  There was consideration for possibly adjusting her antihypertensive.  On exam currently the patient is well-appearing.  She is hypertensive with otherwise normal vital signs.  The pain is somewhat reproducible on exam.  Differential diagnosis includes, but is not limited to, musculoskeletal chest wall pain, radiculopathy, GERD, less likely ACS.  Patient's presentation is most consistent with acute presentation with potential threat to life or bodily function.  The patient is on the cardiac monitor to evaluate for evidence of arrhythmia and/or significant heart rate changes.  Work-up is overall reassuring.  EKG is nonischemic.  Troponins are negative x2.  Chest x-ray is clear.  Metabolic panel shows no acute findings.  CBC is also normal with no leukocytosis.  At this time given that the symptoms have almost completely resolved, the patient is stable for discharge home.  She did have a few  elevated blood pressure readings but the repeat is improved.  She is not having any signs or symptoms of end organ dysfunction or any hypertensive emergency.  Presentation is most consistent with musculoskeletal or other benign chest pain.  I counseled the patient on the results of the work-up.  I advised her to follow-up with her primary care to discuss adjustment of her blood pressure medication.  I gave her thorough return precautions and she expressed understanding.   FINAL CLINICAL IMPRESSION(S) / ED DIAGNOSES   Final diagnoses:  Atypical chest pain  Hypertension, unspecified type     Rx / DC Orders   ED Discharge Orders     None        Note:  This document was prepared using Dragon voice recognition software and may include unintentional dictation errors.    Arta Silence, MD 01/24/22 0500

## 2022-01-24 NOTE — ED Notes (Signed)
ED Provider at bedside. 

## 2022-01-24 NOTE — ED Triage Notes (Signed)
Pt presents to ER with c/o mid-right chest pain that started this evening when pt was laying down to go to sleep.  Pt states chest pain is sharp in nature and non-radiating.  Pt states she is having some sob, and dizziness associated with her pain.  Pt denies hx of heart problems.  Pt is A&O x4 at this time in NAD in triage.

## 2022-01-24 NOTE — Discharge Instructions (Signed)
Make sure you continue to take your blood pressure medication.  Follow-up with your doctor within the next week.  Your blood pressure was still quite high here in the ER tonight so we recommend that you check in with your primary care provider to see if you may need to increase your dosage or add another medication to get better blood pressure control.  Return to the ER for new, worsening, or persistent severe chest pain, difficulty breathing, weakness or lightheadedness, severe headache, vomiting, or persistent high blood pressure readings especially over 180-200 on the top number or over 120 on the bottom number.

## 2022-01-24 NOTE — ED Notes (Signed)
PT brought to ED rm 7 at this time, this RN now assuming care.

## 2022-01-27 ENCOUNTER — Encounter: Payer: Self-pay | Admitting: Nurse Practitioner

## 2022-01-27 ENCOUNTER — Ambulatory Visit (INDEPENDENT_AMBULATORY_CARE_PROVIDER_SITE_OTHER): Payer: Medicare Other | Admitting: Nurse Practitioner

## 2022-01-27 VITALS — BP 134/74 | HR 78 | Temp 98.1°F | Resp 16 | Ht 61.0 in | Wt 152.8 lb

## 2022-01-27 DIAGNOSIS — I10 Essential (primary) hypertension: Secondary | ICD-10-CM | POA: Diagnosis not present

## 2022-01-27 MED ORDER — BISOPROLOL FUMARATE 5 MG PO TABS: 5.0000 mg | ORAL_TABLET | Freq: Every day | ORAL | 2 refills | Status: DC

## 2022-01-27 NOTE — Progress Notes (Signed)
[facility] [place], [place] [address]  Internal MEDICINE  Office Visit Note  Patient Name: Dawn Alvarez  [id number]  [id number]  Date of Service: 01/27/2022  Chief Complaint  Patient presents with   Hyperlipidemia   Hypertension   Hospitalization Follow-up    HPI Ahnya presents for a follow up visit for hypertension. Has an ED visit for elevated  Need adjustment in blood pressure medication. No headache today. BP ok today.      Current Medication: Outpatient Encounter Medications as of 01/27/2022  Medication Sig   Alcohol Swabs (B-D SINGLE USE SWABS REGULAR) PADS Use as directed twice a day   allopurinol (ZYLOPRIM) 100 MG tablet Take 1 tablet (100 mg total) by mouth at bedtime. Take 1 tablet by mouth at night for Gout   amLODipine (NORVASC) 10 MG tablet Take 1 tablet (10 mg total) by mouth daily.   atorvastatin (LIPITOR) 20 MG tablet Take 1 tablet (20 mg total) by mouth daily.   bisoprolol (ZEBETA) 5 MG tablet Take 1 tablet (5 mg total) by mouth daily.   Blood Glucose Monitoring Suppl (TRUE METRIX AIR GLUCOSE METER) w/Device KIT 1 Device by Does not apply route in the morning and at bedtime. Use ad directed e11.65   brimonidine (ALPHAGAN) 0.2 % ophthalmic solution INSTILL 1 DROP INTO EACH EYE TWICE DAILY   Calcium Carb-Cholecalciferol (CALCIUM 600 + D) 600-200 MG-UNIT TABS Take 1 tablet by mouth daily.   celecoxib (CELEBREX) 200 MG capsule Take one tab po qd for one week and then as needed   colchicine 0.6 MG tablet For a gout flare/attack: take 2 tablets by mouth then take 1 more tablet 1 hour later on day 1, then take 1 tablet by mouth daily until it resolves.   cyclobenzaprine (FLEXERIL) 10 MG tablet Take 1 tablet (10 mg total) by mouth at bedtime as needed for muscle spasms.   gabapentin (NEURONTIN) 300 MG capsule Take 1 capsule (300 mg total) by mouth 3 (three) times daily.   glucose blood (TRUE METRIX BLOOD GLUCOSE TEST) test strip Use as  instructed twice a daily diag e11.65   HYDROcodone-acetaminophen (NORCO/VICODIN) 5-325 MG tablet Take 1 tablet by mouth every 6 (six) hours as needed for moderate pain or severe pain.   insulin detemir (LEVEMIR FLEXTOUCH) 100 UNIT/ML FlexPen Inject 30 Units into the skin daily. ALONG WITH NEEDLES   Insulin Pen Needle (PEN NEEDLES) 31G X 8 MM MISC Use as directed with insulin E11.65   Lancets Misc. (ACCU-CHEK MULTICLIX LANCET DEV) KIT by Does not apply route. Use as directed twice a day diag E11.65   lidocaine-prilocaine (EMLA) cream SMARTSIG:1 Topical Every Night   lisinopril (ZESTRIL) 40 MG tablet Take 1 tablet (40 mg total) by mouth daily.   nicotine polacrilex ([employer] NICOTINE) 2 MG gum Take 1 each (2 mg total) by mouth as needed for smoking cessation.   Omega-3 Fatty Acids (FISH OIL) 1000 MG CAPS Take 1,000 mg by mouth daily.    Semaglutide, 1 MG/DOSE, (OZEMPIC, 1 MG/DOSE,) 2 MG/1.5ML SOPN Inject 1 mg into the skin once a week. FOR DM   SIMBRINZA 1-0.2 % SUSP    TRUEplus Lancets 30G MISC Use as directed twice daily diag e11.65   Facility-Administered Encounter Medications as of 01/27/2022  Medication   heparin lock flush 100 UNIT/ML injection   sodium chloride flush (NS) 0.9 % injection 10 mL    Surgical History: Past Surgical History:  Procedure Laterality Date   BREAST BIOPSY  Right 07/05/2019   [place] bx venus marker, grade 3 invasive mammary carcinoma   BREAST BIOPSY Right 07/05/2019   LN bx, hydromarker,PREDOMINANTLY BLOOD AND FIBROADIPOSE TISSUE, WITH SCANT LYMPHOID TISSUE PRESENT       BREAST LUMPECTOMY Right 05/04/2020   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   BREAST LUMPECTOMY WITH RADIOACTIVE SEED AND SENTINEL LYMPH NODE BIOPSY Right 05/04/2020   Procedure: RIGHT BREAST LUMPECTOMY WITH BRACKETED RADIOACTIVE SEED AND SENTINEL LYMPH NODE BIOPSY;  Surgeon: Jovita Kussmaul, MD;  Location: [facility];  Service: General;  Laterality:  Right;   CESAREAN SECTION     COLONOSCOPY WITH PROPOFOL N/A 09/11/2015   Procedure: COLONOSCOPY WITH PROPOFOL;  Surgeon: Lucilla Lame, MD;  Location: [facility] ENDOSCOPY;  Service: Endoscopy;  Laterality: N/A;   IR IMAGING GUIDED PORT INSERTION  07/20/2019    Medical History: Past Medical History:  Diagnosis Date   Asthma    Cancer ([place]) 03/2020   right breast IMC   COPD exacerbation ([place]) 04/12/2016   Diabetes mellitus without complication (HCC)    Hyperlipemia    Hypertension    Personal history of chemotherapy 2021    Family History: Family History  Problem Relation Age of Onset   Diabetes Mother    Hypertension Mother    Hypertension Father    Diabetes Father    Breast cancer Neg Hx     Social History   Socioeconomic History   Marital status: Married    Spouse name: Not on file   Number of children: Not on file   Years of education: Not on file   Highest education level: Not on file  Occupational History   Not on file  Tobacco Use   Smoking status: Former    Packs/day: 1.00    Years: 2.00    Total pack years: 2.00    Types: Cigarettes    Quit date: 02/29/2020    Years since quitting: 1.9   Smokeless tobacco: Never  Substance and Sexual Activity   Alcohol use: Yes    Comment: social   Drug use: No   Sexual activity: Not Currently    Birth control/protection: Post-menopausal  Other Topics Concern   Not on file  Social History Narrative   Not on file   Social Determinants of Health   Financial Resource Strain: Low Risk  (10/24/2020)   Overall Financial Resource Strain (CARDIA)    Difficulty of Paying Living Expenses: Not very hard  Food Insecurity: Not on file  Transportation Needs: Not on file  Physical Activity: Not on file  Stress: Not on file  Social Connections: Not on file  Intimate Partner Violence: Not on file      Review of Systems  Constitutional:  Negative for chills, fatigue and unexpected weight change.  HENT:  Negative for congestion,  rhinorrhea, sneezing and sore throat.   Eyes:  Negative for redness.  Respiratory:  Negative for cough, chest tightness and shortness of breath.   Cardiovascular:  Negative for chest pain and palpitations.  Gastrointestinal:  Negative for abdominal pain, constipation, diarrhea, nausea and vomiting.  Genitourinary:  Negative for dysuria and frequency.  Musculoskeletal:  Negative for arthralgias, back pain, joint swelling and neck pain.  Skin:  Negative for rash.  Neurological: Negative.  Negative for tremors and numbness.  Hematological:  Negative for adenopathy. Does not bruise/bleed easily.  Psychiatric/Behavioral:  Negative for behavioral problems (Depression), sleep disturbance and suicidal ideas. The patient is not nervous/anxious.  Vital Signs: BP 134/74   Pulse 78   Temp 98.1 F (36.7 C)   Resp 16   Ht '5\' 1"'  (1.549 m)   Wt 152 lb 12.8 oz (69.3 kg)   SpO2 99%   BMI 28.87 kg/m    Physical Exam Vitals reviewed.  Constitutional:      General: She is not in acute distress.    Appearance: Normal appearance. She is not ill-appearing.  HENT:     Head: Normocephalic and atraumatic.  Eyes:     Pupils: Pupils are equal, round, and reactive to light.  Cardiovascular:     Rate and Rhythm: Normal rate and regular rhythm.     Heart sounds: Normal heart sounds. No murmur heard. Pulmonary:     Effort: Pulmonary effort is normal. No respiratory distress.     Breath sounds: Normal breath sounds. No wheezing.  Neurological:     Mental Status: She is alert and oriented to person, place, and time.  Psychiatric:        Mood and Affect: Mood normal.        Behavior: Behavior normal.        Assessment/Plan: 1. Essential hypertension Bisoprolol added to medication, follow up in 4 weeks. - bisoprolol (ZEBETA) 5 MG tablet; Take 1 tablet (5 mg total) by mouth daily.  Dispense: 30 tablet; Refill: 2   General Counseling: Marvene verbalizes understanding of the findings of todays  visit and agrees with plan of treatment. I have discussed any further diagnostic evaluation that may be needed or ordered today. We also reviewed her medications today. she has been encouraged to call the office with any questions or concerns that should arise related to todays visit.    No orders of the defined types were placed in this encounter.   Meds ordered this encounter  Medications   bisoprolol (ZEBETA) 5 MG tablet    Sig: Take 1 tablet (5 mg total) by mouth daily.    Dispense:  30 tablet    Refill:  2    Return in about 4 weeks (around 02/24/2022) for F/U, BP check, Deontre Allsup PCP, eval new med.   Total time spent:20 Minutes Time spent includes review of chart, medications, test results, and follow up plan with the patient.   [place] Controlled Substance Database was reviewed by me.  This patient was seen by [name], FNP-C in collaboration with Dr. Clayborn Bigness as a part of collaborative care agreement.   Dorissa Stinnette R. Valetta Fuller, MSN, FNP-C Internal medicine

## 2022-01-27 NOTE — Patient Instructions (Signed)
Tomorrow: start taking lisinopril and bisoprolol in the morning and take amlodipine in the evening or at bedtime.

## 2022-01-29 ENCOUNTER — Other Ambulatory Visit: Payer: Self-pay | Admitting: Nurse Practitioner

## 2022-01-29 DIAGNOSIS — I10 Essential (primary) hypertension: Secondary | ICD-10-CM

## 2022-01-29 DIAGNOSIS — M10072 Idiopathic gout, left ankle and foot: Secondary | ICD-10-CM

## 2022-01-29 MED ORDER — LISINOPRIL 40 MG PO TABS: 40.0000 mg | ORAL_TABLET | Freq: Every day | ORAL | 1 refills | Status: DC

## 2022-01-31 ENCOUNTER — Inpatient Hospital Stay: Payer: Medicare Other | Attending: Oncology

## 2022-01-31 DIAGNOSIS — Z171 Estrogen receptor negative status [ER-]: Secondary | ICD-10-CM | POA: Diagnosis not present

## 2022-01-31 DIAGNOSIS — C50411 Malignant neoplasm of upper-outer quadrant of right female breast: Secondary | ICD-10-CM | POA: Diagnosis not present

## 2022-01-31 DIAGNOSIS — Z452 Encounter for adjustment and management of vascular access device: Secondary | ICD-10-CM | POA: Diagnosis not present

## 2022-01-31 DIAGNOSIS — Z95828 Presence of other vascular implants and grafts: Secondary | ICD-10-CM

## 2022-01-31 MED ORDER — SODIUM CHLORIDE 0.9% FLUSH
10.0000 mL | Freq: Once | INTRAVENOUS | Status: AC
  Administered 2022-01-31: 10 mL via INTRAVENOUS
  Filled 2022-01-31: qty 10

## 2022-01-31 MED ORDER — HEPARIN SOD (PORK) LOCK FLUSH 100 UNIT/ML IV SOLN
500.0000 [IU] | Freq: Once | INTRAVENOUS | Status: AC
  Administered 2022-01-31: 500 [IU] via INTRAVENOUS
  Filled 2022-01-31: qty 5

## 2022-02-24 ENCOUNTER — Ambulatory Visit (INDEPENDENT_AMBULATORY_CARE_PROVIDER_SITE_OTHER): Payer: Medicare Other | Admitting: Nurse Practitioner

## 2022-02-24 ENCOUNTER — Encounter: Payer: Self-pay | Admitting: Nurse Practitioner

## 2022-02-24 VITALS — BP 136/78 | HR 85 | Temp 97.0°F | Resp 16 | Ht 61.0 in | Wt 152.4 lb

## 2022-02-24 DIAGNOSIS — M10072 Idiopathic gout, left ankle and foot: Secondary | ICD-10-CM

## 2022-02-24 DIAGNOSIS — Z794 Long term (current) use of insulin: Secondary | ICD-10-CM | POA: Diagnosis not present

## 2022-02-24 DIAGNOSIS — G629 Polyneuropathy, unspecified: Secondary | ICD-10-CM | POA: Diagnosis not present

## 2022-02-24 DIAGNOSIS — E1140 Type 2 diabetes mellitus with diabetic neuropathy, unspecified: Secondary | ICD-10-CM

## 2022-02-24 DIAGNOSIS — I10 Essential (primary) hypertension: Secondary | ICD-10-CM | POA: Diagnosis not present

## 2022-02-24 MED ORDER — GABAPENTIN 300 MG PO CAPS: 300.0000 mg | ORAL_CAPSULE | Freq: Three times a day (TID) | ORAL | 3 refills | Status: DC

## 2022-02-24 NOTE — Progress Notes (Signed)
[facility] [place], [place] [address]  Internal MEDICINE  Office Visit Note  Patient Name: Dawn Alvarez  [id number]  [id number]  Date of Service: 02/24/2022  Chief Complaint  Patient presents with   Follow-up    Follow up blood pressure.    Diabetes   Hyperlipidemia   Hypertension    HPI Ambrea presents for a follow up visit for hypertension and diabetes.  Hypertension -- stable, bisoprolol added at last visit.  Diabetes -- last a1c 5.8, taking levemir 30 units daily and ozempic 1 mg weekly Acute gout attack -- left foot -- started yesterday. Takes allopurinol for prevention, and colchicine for acute attack    Current Medication: Outpatient Encounter Medications as of 02/24/2022  Medication Sig   Alcohol Swabs (B-D SINGLE USE SWABS REGULAR) PADS Use as directed twice a day   allopurinol (ZYLOPRIM) 100 MG tablet Take 1 tablet (100 mg total) by mouth at bedtime. Take 1 tablet by mouth at night for Gout   amLODipine (NORVASC) 10 MG tablet Take 1 tablet (10 mg total) by mouth daily.   atorvastatin (LIPITOR) 20 MG tablet Take 1 tablet (20 mg total) by mouth daily.   bisoprolol (ZEBETA) 5 MG tablet Take 1 tablet (5 mg total) by mouth daily.   Blood Glucose Monitoring Suppl (TRUE METRIX AIR GLUCOSE METER) w/Device KIT 1 Device by Does not apply route in the morning and at bedtime. Use ad directed e11.65   brimonidine (ALPHAGAN) 0.2 % ophthalmic solution INSTILL 1 DROP INTO BOTH EYES  TWICE DAILY   Calcium Carb-Cholecalciferol (CALCIUM 600 + D) 600-200 MG-UNIT TABS Take 1 tablet by mouth daily.   celecoxib (CELEBREX) 200 MG capsule Take one tab po qd for one week and then as needed   colchicine 0.6 MG tablet TAKE FOR A GOUT FLARE/ATTACK:  TAKE 2 TABLETS BY MOUTH THEN  TAKE 1 MORE TABLET 1 HOUR LATER  ON DAY 1, THEN TAKE 1 TABLET BY  MOUTH DAILY UNTIL IT RESOLVES   cyclobenzaprine (FLEXERIL) 10 MG tablet Take 1 tablet (10 mg total) by mouth at bedtime as  needed for muscle spasms.   glucose blood (TRUE METRIX BLOOD GLUCOSE TEST) test strip Use as instructed twice a daily diag e11.65   HYDROcodone-acetaminophen (NORCO/VICODIN) 5-325 MG tablet Take 1 tablet by mouth every 6 (six) hours as needed for moderate pain or severe pain.   insulin detemir (LEVEMIR FLEXTOUCH) 100 UNIT/ML FlexPen Inject 30 Units into the skin daily. ALONG WITH NEEDLES   Insulin Pen Needle (PEN NEEDLES) 31G X 8 MM MISC Use as directed with insulin E11.65   Lancets Misc. (ACCU-CHEK MULTICLIX LANCET DEV) KIT by Does not apply route. Use as directed twice a day diag E11.65   lidocaine-prilocaine (EMLA) cream SMARTSIG:1 Topical Every Night   lisinopril (ZESTRIL) 40 MG tablet Take 1 tablet (40 mg total) by mouth daily.   nicotine polacrilex ([employer] NICOTINE) 2 MG gum Take 1 each (2 mg total) by mouth as needed for smoking cessation.   Omega-3 Fatty Acids (FISH OIL) 1000 MG CAPS Take 1,000 mg by mouth daily.    Semaglutide, 1 MG/DOSE, (OZEMPIC, 1 MG/DOSE,) 2 MG/1.5ML SOPN Inject 1 mg into the skin once a week. FOR DM   SIMBRINZA 1-0.2 % SUSP    TRUEplus Lancets 30G MISC Use as directed twice daily diag e11.65   [DISCONTINUED] gabapentin (NEURONTIN) 300 MG capsule Take 1 capsule (300 mg total) by mouth 3 (three) times daily.   gabapentin (NEURONTIN)  300 MG capsule Take 1 capsule (300 mg total) by mouth 3 (three) times daily.   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   BREAST LUMPECTOMY WITH RADIOACTIVE SEED AND SENTINEL LYMPH NODE BIOPSY Right 05/04/2020   Procedure: RIGHT BREAST LUMPECTOMY WITH BRACKETED RADIOACTIVE SEED AND SENTINEL LYMPH NODE BIOPSY;  Surgeon: Jovita Kussmaul, MD;  Location: [facility];  Service: General;  Laterality: Right;   CESAREAN SECTION     COLONOSCOPY WITH PROPOFOL N/A 09/11/2015   Procedure: COLONOSCOPY WITH PROPOFOL;  Surgeon: Lucilla Lame, MD;  Location: [facility];  Service: Endoscopy;  Laterality: N/A;   IR IMAGING GUIDED PORT INSERTION  07/20/2019    Medical History: Past Medical History:  Diagnosis Date   Asthma    Cancer ([place]) 03/2020   right breast IMC   COPD exacerbation ([place]) 04/12/2016   Diabetes mellitus without complication (HCC)    Hyperlipemia    Hypertension    Personal history of chemotherapy 2021    Family History: Family History  Problem Relation Age of Onset   Diabetes Mother    Hypertension Mother    Hypertension Father    Diabetes Father    Breast cancer Neg Hx     Social History   Socioeconomic History   Marital status: Married    Spouse name: Not on file   Number of children: Not on file   Years of education: Not on file   Highest education level: Not on file  Occupational History   Not on file  Tobacco Use   Smoking status: Former    Packs/day: 1.00    Years: 2.00    Total pack years: 2.00    Types: Cigarettes    Quit date: 02/29/2020    Years since quitting: 1.9   Smokeless tobacco: Never  Substance and Sexual Activity   Alcohol use: Yes    Comment: social   Drug use: No   Sexual activity: Not Currently    Birth control/protection: Post-menopausal  Other Topics Concern   Not on file  Social History Narrative   Not on file   Social Determinants of Health   Financial Resource Strain: Low Risk  (10/24/2020)   Overall Financial Resource Strain (CARDIA)    Difficulty of Paying Living Expenses: Not very hard  Food Insecurity: Not on  file  Transportation Needs: Not on file  Physical Activity: Not on file  Stress: Not on file  Social Connections: Not on file  Intimate Partner Violence: Not on file      Review of Systems  Constitutional:  Negative for chills, fatigue and unexpected weight change.  HENT:  Negative for congestion, rhinorrhea, sneezing and sore throat.   Eyes:  Negative for redness.  Respiratory:  Negative for cough, chest tightness and shortness of breath.   Cardiovascular:  Negative for chest pain and palpitations.  Gastrointestinal:  Negative for abdominal pain, constipation, diarrhea, nausea and vomiting.  Genitourinary:  Negative for dysuria  and frequency.  Musculoskeletal:  Positive for arthralgias (left foot) and joint swelling (minimal left foot). Negative for back pain and neck pain.  Skin:  Negative for rash.  Neurological: Negative.  Negative for tremors and numbness.  Hematological:  Negative for adenopathy. Does not bruise/bleed easily.  Psychiatric/Behavioral:  Negative for behavioral problems (Depression), sleep disturbance and suicidal ideas. The patient is not nervous/anxious.     Vital Signs: BP 136/78   Pulse 85   Temp (!) 97 F (36.1 C)   Resp 16   Ht '5\' 1"'  (1.549 m)   Wt 152 lb 6.4 oz (69.1 kg)   SpO2 98%   BMI 28.80 kg/m    Physical Exam Vitals reviewed.  Constitutional:      Appearance: Normal appearance.  HENT:     Head: Normocephalic and atraumatic.  Eyes:     Pupils: Pupils are equal, round, and reactive to light.  Cardiovascular:     Rate and Rhythm: Normal rate and regular rhythm.  Pulmonary:     Effort: Pulmonary effort is normal. No respiratory distress.  Musculoskeletal:        General: Swelling (minimal left foot) present.  Neurological:     Mental Status: She is alert and oriented to person, place, and time.  Psychiatric:        Mood and Affect: Mood normal.        Behavior: Behavior normal.        Assessment/Plan: 1. Essential  hypertension Stable with current medications including the addition of bisoprolol 5 mg daily, continue medications as prescribed.   2. Type 2 diabetes mellitus with diabetic neuropathy, with long-term current use of insulin (HCC) Lat A1C was stable at 5.8. continue medications as prescribed, will repeat a1c in 6 weeks to assess for continued stability.  - gabapentin (NEURONTIN) 300 MG capsule; Take 1 capsule (300 mg total) by mouth 3 (three) times daily.  Dispense: 90 capsule; Refill: 3  3. Acute idiopathic gout of left foot Using acute tx, colchicine as prescribed, left foot should start improving in the next 2-3 days. If no improvement by Friday, call the clinic  4. Neuropathy Controlled with gabapentin   General Counseling: Avneet verbalizes understanding of the findings of todays visit and agrees with plan of treatment. I have discussed any further diagnostic evaluation that may be needed or ordered today. We also reviewed her medications today. she has been encouraged to call the office with any questions or concerns that should arise related to todays visit.    No orders of the defined types were placed in this encounter.   Meds ordered this encounter  Medications   gabapentin (NEURONTIN) 300 MG capsule    Sig: Take 1 capsule (300 mg total) by mouth 3 (three) times daily.    Dispense:  90 capsule    Refill:  3    Return in about 6 weeks (around 04/07/2022) for F/U, Recheck A1C, Hermine Feria PCP.   Total time spent:30 Minutes Time spent includes review of chart, medications, test results, and follow up plan with the patient.   [place] Controlled Substance Database was reviewed by me.  This patient was seen by [name], FNP-C in collaboration with Dr. Clayborn Bigness as a part of collaborative care agreement.   Garret Teale R. Valetta Fuller, MSN, FNP-C Internal medicine

## 2022-03-28 ENCOUNTER — Inpatient Hospital Stay: Payer: Medicare Other | Attending: Oncology

## 2022-03-28 DIAGNOSIS — Z171 Estrogen receptor negative status [ER-]: Secondary | ICD-10-CM | POA: Insufficient documentation

## 2022-03-28 DIAGNOSIS — C50411 Malignant neoplasm of upper-outer quadrant of right female breast: Secondary | ICD-10-CM | POA: Diagnosis not present

## 2022-03-28 DIAGNOSIS — Z95828 Presence of other vascular implants and grafts: Secondary | ICD-10-CM

## 2022-03-28 LAB — COMPREHENSIVE METABOLIC PANEL
ALT: 18 U/L (ref 0–44)
AST: 22 U/L (ref 15–41)
Albumin: 3.6 g/dL (ref 3.5–5.0)
Alkaline Phosphatase: 43 U/L (ref 38–126)
Anion gap: 8 (ref 5–15)
BUN: 17 mg/dL (ref 8–23)
CO2: 23 mmol/L (ref 22–32)
Calcium: 8.7 mg/dL — ABNORMAL LOW (ref 8.9–10.3)
Chloride: 106 mmol/L (ref 98–111)
Creatinine, Ser: 0.78 mg/dL (ref 0.44–1.00)
GFR, Estimated: >60 mL/min (ref >60)
Glucose, Bld: 114 mg/dL — ABNORMAL HIGH (ref 70–99)
Potassium: 3.6 mmol/L (ref 3.5–5.1)
Sodium: 137 mmol/L (ref 135–145)
Total Bilirubin: 0.4 mg/dL (ref 0.3–1.2)
Total Protein: 7.1 g/dL (ref 6.5–8.1)

## 2022-03-28 LAB — CBC WITH DIFFERENTIAL/PLATELET
Abs Immature Granulocytes: 0.02 10*3/uL (ref 0.00–0.07)
Basophils Absolute: 0 10*3/uL (ref 0.0–0.1)
Basophils Relative: 1 %
Eosinophils Absolute: 0.2 10*3/uL (ref 0.0–0.5)
Eosinophils Relative: 3 %
HCT: 36.3 % (ref 36.0–46.0)
Hemoglobin: 12.1 g/dL (ref 12.0–15.0)
Immature Granulocytes: 0 %
Lymphocytes Relative: 31 %
Lymphs Abs: 1.6 10*3/uL (ref 0.7–4.0)
MCH: 29.2 pg (ref 26.0–34.0)
MCHC: 33.3 g/dL (ref 30.0–36.0)
MCV: 87.7 fL (ref 80.0–100.0)
Monocytes Absolute: 0.5 10*3/uL (ref 0.1–1.0)
Monocytes Relative: 9 %
Neutro Abs: 2.8 10*3/uL (ref 1.7–7.7)
Neutrophils Relative %: 56 %
Platelets: 212 10*3/uL (ref 150–400)
RBC: 4.14 MIL/uL (ref 3.87–5.11)
RDW: 14.3 % (ref 11.5–15.5)
WBC: 5.1 10*3/uL (ref 4.0–10.5)
nRBC: 0 % (ref 0.0–0.2)

## 2022-03-28 MED ORDER — HEPARIN SOD (PORK) LOCK FLUSH 100 UNIT/ML IV SOLN
500.0000 [IU] | Freq: Once | INTRAVENOUS | Status: AC
  Administered 2022-03-28: 500 [IU] via INTRAVENOUS
  Filled 2022-03-28: qty 5

## 2022-03-28 MED ORDER — SODIUM CHLORIDE 0.9% FLUSH
10.0000 mL | Freq: Once | INTRAVENOUS | Status: AC
  Administered 2022-03-28: 10 mL via INTRAVENOUS
  Filled 2022-03-28: qty 10

## 2022-04-08 ENCOUNTER — Ambulatory Visit (INDEPENDENT_AMBULATORY_CARE_PROVIDER_SITE_OTHER): Payer: Medicare Other | Admitting: Nurse Practitioner

## 2022-04-08 ENCOUNTER — Encounter: Payer: Self-pay | Admitting: Nurse Practitioner

## 2022-04-08 VITALS — BP 134/88 | HR 86 | Temp 96.7°F | Resp 16 | Ht 61.0 in | Wt 152.8 lb

## 2022-04-08 DIAGNOSIS — E1140 Type 2 diabetes mellitus with diabetic neuropathy, unspecified: Secondary | ICD-10-CM | POA: Diagnosis not present

## 2022-04-08 DIAGNOSIS — Z760 Encounter for issue of repeat prescription: Secondary | ICD-10-CM | POA: Diagnosis not present

## 2022-04-08 DIAGNOSIS — I10 Essential (primary) hypertension: Secondary | ICD-10-CM | POA: Diagnosis not present

## 2022-04-08 DIAGNOSIS — E782 Mixed hyperlipidemia: Secondary | ICD-10-CM | POA: Diagnosis not present

## 2022-04-08 DIAGNOSIS — Z794 Long term (current) use of insulin: Secondary | ICD-10-CM | POA: Diagnosis not present

## 2022-04-08 LAB — POCT GLYCOSYLATED HEMOGLOBIN (HGB A1C): Hemoglobin A1C: 6.2 % — AB (ref 4.0–5.6)

## 2022-04-08 MED ORDER — BD SWAB SINGLE USE REGULAR PADS
MEDICATED_PAD | 3 refills | Status: DC
Start: 2022-04-08 — End: 2022-04-09

## 2022-04-08 MED ORDER — BRIMONIDINE TARTRATE 0.2 % OP SOLN: OPHTHALMIC | 2 refills | Status: DC

## 2022-04-08 MED ORDER — GABAPENTIN 300 MG PO CAPS: ORAL_CAPSULE | ORAL | 3 refills | Status: DC

## 2022-04-08 NOTE — Progress Notes (Signed)
[facility] [place], [place] [address]  Internal MEDICINE  Office Visit Note  Patient Name: Dawn Alvarez  [id number]  [id number]  Date of Service: 04/08/2022  Chief Complaint  Patient presents with   Follow-up   Hypertension   Hyperlipidemia   Diabetes    HPI Dawn Alvarez presents for a follow up visit for diabetes, hypertension and hyperlipidemia.  Diabetes -- A1c 6.2, slightly increased but still stable. Glucose readings range 98-106 at home  Hypertension -- controlled with current medications Hyperlipidemia - takes atorvastatin and a fish oil supplement    Current Medication: Outpatient Encounter Medications as of 04/08/2022  Medication Sig   allopurinol (ZYLOPRIM) 100 MG tablet Take 1 tablet (100 mg total) by mouth at bedtime. Take 1 tablet by mouth at night for Gout   amLODipine (NORVASC) 10 MG tablet Take 1 tablet (10 mg total) by mouth daily.   atorvastatin (LIPITOR) 20 MG tablet Take 1 tablet (20 mg total) by mouth daily.   bisoprolol (ZEBETA) 5 MG tablet Take 1 tablet (5 mg total) by mouth daily.   Blood Glucose Monitoring Suppl (TRUE METRIX AIR GLUCOSE METER) w/Device KIT 1 Device by Does not apply route in the morning and at bedtime. Use ad directed e11.65   Calcium Carb-Cholecalciferol (CALCIUM 600 + D) 600-200 MG-UNIT TABS Take 1 tablet by mouth daily.   celecoxib (CELEBREX) 200 MG capsule Take one tab po qd for one week and then as needed   colchicine 0.6 MG tablet TAKE FOR A GOUT FLARE/ATTACK:  TAKE 2 TABLETS BY MOUTH THEN  TAKE 1 MORE TABLET 1 HOUR LATER  ON DAY 1, THEN TAKE 1 TABLET BY  MOUTH DAILY UNTIL IT RESOLVES   cyclobenzaprine (FLEXERIL) 10 MG tablet Take 1 tablet (10 mg total) by mouth at bedtime as needed for muscle spasms.   glucose blood (TRUE METRIX BLOOD GLUCOSE TEST) test strip Use as instructed twice a daily diag e11.65   HYDROcodone-acetaminophen (NORCO/VICODIN) 5-325 MG tablet Take 1 tablet by mouth every 6 (six) hours as  needed for moderate pain or severe pain.   insulin detemir (LEVEMIR FLEXTOUCH) 100 UNIT/ML FlexPen Inject 30 Units into the skin daily. ALONG WITH NEEDLES   Insulin Pen Needle (PEN NEEDLES) 31G X 8 MM MISC Use as directed with insulin E11.65   Lancets Misc. (ACCU-CHEK MULTICLIX LANCET DEV) KIT by Does not apply route. Use as directed twice a day diag E11.65   lidocaine-prilocaine (EMLA) cream SMARTSIG:1 Topical Every Night   lisinopril (ZESTRIL) 40 MG tablet Take 1 tablet (40 mg total) by mouth daily.   nicotine polacrilex ([employer]S NICOTINE) 2 MG gum Take 1 each (2 mg total) by mouth as needed for smoking cessation.   Omega-3 Fatty Acids (FISH OIL) 1000 MG CAPS Take 1,000 mg by mouth daily.    Semaglutide, 1 MG/DOSE, (OZEMPIC, 1 MG/DOSE,) 2 MG/1.5ML SOPN Inject 1 mg into the skin once a week. FOR DM   SIMBRINZA 1-0.2 % SUSP    TRUEplus Lancets 30G MISC Use as directed twice daily diag e11.65   [DISCONTINUED] Alcohol Swabs (B-D SINGLE USE SWABS REGULAR) PADS Use as directed twice a day   [DISCONTINUED] brimonidine (ALPHAGAN) 0.2 % ophthalmic solution INSTILL 1 DROP INTO BOTH EYES  TWICE DAILY   [DISCONTINUED] gabapentin (NEURONTIN) 300 MG capsule Take 1 capsule (300 mg total) by mouth 3 (three) times daily.   Alcohol Swabs (B-D SINGLE USE SWABS REGULAR) PADS Use as directed twice a day   brimonidine (ALPHAGAN)  0.2 % ophthalmic solution INSTILL 1 DROP INTO BOTH EYES  TWICE DAILY   gabapentin (NEURONTIN) 300 MG capsule Take 1 capsule by mouth in the morning, 1 capsule by mouth at lunch/midday, and 2 capsule by mouth at bedtime.   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   BREAST LUMPECTOMY WITH RADIOACTIVE SEED AND SENTINEL LYMPH NODE BIOPSY Right 05/04/2020   Procedure: RIGHT BREAST LUMPECTOMY WITH BRACKETED RADIOACTIVE SEED AND SENTINEL LYMPH NODE BIOPSY;  Surgeon: Jovita Kussmaul, MD;  Location: [facility];  Service: General;  Laterality: Right;   CESAREAN SECTION     COLONOSCOPY WITH PROPOFOL N/A 09/11/2015   Procedure: COLONOSCOPY WITH PROPOFOL;  Surgeon: Lucilla Lame, MD;  Location: [facility];  Service: Endoscopy;  Laterality: N/A;   IR IMAGING GUIDED PORT INSERTION  07/20/2019    Medical History: Past Medical History:  Diagnosis Date   Asthma    Cancer ([place]) 03/2020   right breast IMC   COPD exacerbation ([place]) 04/12/2016   Diabetes mellitus without complication (HCC)    Hyperlipemia    Hypertension    Personal history of chemotherapy 2021    Family History: Family History  Problem Relation Age of Onset   Diabetes Mother    Hypertension Mother    Hypertension Father    Diabetes Father    Breast cancer Neg Hx     Social History   Socioeconomic History   Marital status: Married    Spouse name: Not on file   Number of children: Not on file   Years of education: Not on file   Highest education level: Not on file  Occupational History   Not on file  Tobacco Use   Smoking status: Former    Packs/day: 1.00    Years: 2.00    Total pack years: 2.00    Types: Cigarettes    Quit date: 02/29/2020    Years since quitting: 2.1   Smokeless tobacco: Never  Substance and Sexual Activity   Alcohol use: Yes    Comment: social   Drug use: No   Sexual activity: Not Currently    Birth control/protection: Post-menopausal  Other Topics Concern   Not on file  Social History Narrative   Not on file   Social  Determinants of Health   Financial Resource Strain: Low Risk  (10/24/2020)   Overall Financial Resource Strain (CARDIA)    Difficulty of Paying Living Expenses: Not very hard  Food Insecurity: Not on file  Transportation Needs: Not on file  Physical Activity: Not on file  Stress: Not on file  Social Connections: Not on file  Intimate Partner Violence: Not on file      Review of Systems  Constitutional:  Negative for chills, fatigue and unexpected weight change.  HENT:  Negative for congestion, rhinorrhea, sneezing and sore throat.   Eyes:  Negative for redness.  Respiratory:  Negative for cough, chest tightness and shortness of breath.  Cardiovascular:  Negative for chest pain and palpitations.  Gastrointestinal:  Negative for abdominal pain, constipation, diarrhea, nausea and vomiting.  Genitourinary:  Negative for dysuria and frequency.  Musculoskeletal:  Negative for back pain and neck pain.  Skin:  Negative for rash.  Neurological: Negative.  Negative for tremors and numbness.  Hematological:  Negative for adenopathy. Does not bruise/bleed easily.  Psychiatric/Behavioral:  Negative for behavioral problems (Depression), sleep disturbance and suicidal ideas. The patient is not nervous/anxious.     Vital Signs: BP 134/88 Comment: 153/88  Pulse 86   Temp (!) 96.7 F (35.9 C)   Resp 16   Ht _0  (1.549 m)   Wt 152 lb 12.8 oz (69.3 kg)   SpO2 99%   BMI 28.87 kg/m    Physical Exam Vitals reviewed.  Constitutional:      Appearance: Normal appearance.  HENT:     Head: Normocephalic and atraumatic.  Eyes:     Pupils: Pupils are equal, round, and reactive to light.  Cardiovascular:     Rate and Rhythm: Normal rate and regular rhythm.  Pulmonary:     Effort: Pulmonary effort is normal. No respiratory distress.  Neurological:     Mental Status: She is alert and oriented to person, place, and time.  Psychiatric:        Mood and Affect: Mood normal.        Behavior:  Behavior normal.        Assessment/Plan: 1. Type 2 diabetes mellitus with diabetic neuropathy, with long-term current use of insulin (HCC) Stable, no changes. Repeat A1c in 3 months - POCT glycosylated hemoglobin (Hb A1C)  2. Essential hypertension Stable, continue medications as prescribed  3. Mixed hyperlipidemia Continue atorvastatin and fish oil supplement as prescribed  4. Medication refill - brimonidine (ALPHAGAN) 0.2 % ophthalmic solution; INSTILL 1 DROP INTO BOTH EYES  TWICE DAILY  Dispense: 30 mL; Refill: 2 - gabapentin (NEURONTIN) 300 MG capsule; Take 1 capsule by mouth in the morning, 1 capsule by mouth at lunch/midday, and 2 capsule by mouth at bedtime.  Dispense: 120 capsule; Refill: 3   General Counseling: Dawn Alvarez verbalizes understanding of the findings of todays visit and agrees with plan of treatment. I have discussed any further diagnostic evaluation that may be needed or ordered today. We also reviewed her medications today. she has been encouraged to call the office with any questions or concerns that should arise related to todays visit.    Orders Placed This Encounter  Procedures   POCT glycosylated hemoglobin (Hb A1C)    Meds ordered this encounter  Medications   brimonidine (ALPHAGAN) 0.2 % ophthalmic solution    Sig: INSTILL 1 DROP INTO BOTH EYES  TWICE DAILY    Dispense:  30 mL    Refill:  2    Please send a replace/new response with 100-Day Supply if appropriate to maximize member benefit. Requesting 1 year supply.   gabapentin (NEURONTIN) 300 MG capsule    Sig: Take 1 capsule by mouth in the morning, 1 capsule by mouth at lunch/midday, and 2 capsule by mouth at bedtime.    Dispense:  120 capsule    Refill:  3    Note change in directions, dose and number of capsule   Alcohol Swabs (B-D SINGLE USE SWABS REGULAR) PADS    Sig: Use as directed twice a day    Dispense:  300 each    Refill:  3    Return in about 3 months (around 07/09/2022)  for  F/U, Recheck A1C, Dawn Alvarez PCP.   Total time spent:30 Minutes Time spent includes review of chart, medications, test results, and follow up plan with the patient.   [place] Controlled Substance Database was reviewed by me.  This patient was seen by [name], FNP-C in collaboration with Dr. Clayborn Bigness as a part of collaborative care agreement.   Constantin Hillery R. Valetta Fuller, MSN, FNP-C Internal medicine

## 2022-04-09 ENCOUNTER — Telehealth: Payer: Self-pay | Admitting: Nurse Practitioner

## 2022-04-09 ENCOUNTER — Other Ambulatory Visit: Payer: Self-pay

## 2022-04-09 MED ORDER — BD SWAB SINGLE USE REGULAR PADS: MEDICATED_PAD | 3 refills | Status: DC

## 2022-04-09 NOTE — Telephone Encounter (Signed)
Spoke with pt that refills already send yesterday resend alcohol swab again

## 2022-04-17 ENCOUNTER — Other Ambulatory Visit: Payer: Medicare Other

## 2022-04-17 ENCOUNTER — Inpatient Hospital Stay: Payer: Medicare Other | Attending: Oncology | Admitting: Oncology

## 2022-05-23 ENCOUNTER — Other Ambulatory Visit: Payer: Self-pay

## 2022-05-23 ENCOUNTER — Inpatient Hospital Stay: Payer: Medicare Other | Attending: Oncology

## 2022-05-23 DIAGNOSIS — Z452 Encounter for adjustment and management of vascular access device: Secondary | ICD-10-CM | POA: Insufficient documentation

## 2022-05-23 DIAGNOSIS — Z171 Estrogen receptor negative status [ER-]: Secondary | ICD-10-CM

## 2022-05-23 DIAGNOSIS — Z95828 Presence of other vascular implants and grafts: Secondary | ICD-10-CM

## 2022-05-23 DIAGNOSIS — C50411 Malignant neoplasm of upper-outer quadrant of right female breast: Secondary | ICD-10-CM | POA: Diagnosis not present

## 2022-05-23 MED ORDER — HEPARIN SOD (PORK) LOCK FLUSH 100 UNIT/ML IV SOLN
500.0000 [IU] | Freq: Once | INTRAVENOUS | Status: AC
  Administered 2022-05-23: 500 [IU] via INTRAVENOUS
  Filled 2022-05-23: qty 5

## 2022-05-23 MED ORDER — SODIUM CHLORIDE 0.9% FLUSH
10.0000 mL | Freq: Once | INTRAVENOUS | Status: AC
  Administered 2022-05-23: 10 mL via INTRAVENOUS
  Filled 2022-05-23: qty 10

## 2022-05-24 ENCOUNTER — Encounter: Payer: Self-pay | Admitting: Nurse Practitioner

## 2022-06-10 ENCOUNTER — Other Ambulatory Visit: Payer: Self-pay | Admitting: Internal Medicine

## 2022-06-11 ENCOUNTER — Other Ambulatory Visit: Payer: Self-pay | Admitting: Nurse Practitioner

## 2022-06-11 DIAGNOSIS — I10 Essential (primary) hypertension: Secondary | ICD-10-CM

## 2022-06-24 ENCOUNTER — Other Ambulatory Visit: Payer: Self-pay

## 2022-06-24 DIAGNOSIS — E1165 Type 2 diabetes mellitus with hyperglycemia: Secondary | ICD-10-CM

## 2022-06-24 MED ORDER — OZEMPIC (1 MG/DOSE) 2 MG/1.5ML ~~LOC~~ SOPN: 1.0000 mg | PEN_INJECTOR | SUBCUTANEOUS | 6 refills | Status: DC

## 2022-06-24 MED ORDER — LEVEMIR FLEXTOUCH 100 UNIT/ML ~~LOC~~ SOPN: 30.0000 [IU] | PEN_INJECTOR | Freq: Every day | SUBCUTANEOUS | 1 refills | Status: DC

## 2022-07-05 ENCOUNTER — Emergency Department
Admission: EM | Admit: 2022-07-05 | Discharge: 2022-07-05 | Disposition: A | Discharge status: Home / Self Care | Payer: 59 | Attending: Emergency Medicine | Admitting: Emergency Medicine

## 2022-07-05 ENCOUNTER — Emergency Department: Payer: 59

## 2022-07-05 ENCOUNTER — Encounter: Payer: Self-pay | Admitting: Emergency Medicine

## 2022-07-05 ENCOUNTER — Other Ambulatory Visit: Payer: Self-pay

## 2022-07-05 DIAGNOSIS — S12391A Other nondisplaced fracture of fourth cervical vertebra, initial encounter for closed fracture: Secondary | ICD-10-CM | POA: Insufficient documentation

## 2022-07-05 DIAGNOSIS — R519 Headache, unspecified: Secondary | ICD-10-CM | POA: Diagnosis not present

## 2022-07-05 DIAGNOSIS — I10 Essential (primary) hypertension: Secondary | ICD-10-CM | POA: Diagnosis not present

## 2022-07-05 DIAGNOSIS — S4991XA Unspecified injury of right shoulder and upper arm, initial encounter: Secondary | ICD-10-CM | POA: Insufficient documentation

## 2022-07-05 DIAGNOSIS — Z853 Personal history of malignant neoplasm of breast: Secondary | ICD-10-CM | POA: Diagnosis not present

## 2022-07-05 DIAGNOSIS — J449 Chronic obstructive pulmonary disease, unspecified: Secondary | ICD-10-CM | POA: Insufficient documentation

## 2022-07-05 DIAGNOSIS — Y9289 Other specified places as the place of occurrence of the external cause: Secondary | ICD-10-CM | POA: Insufficient documentation

## 2022-07-05 DIAGNOSIS — M25511 Pain in right shoulder: Secondary | ICD-10-CM | POA: Diagnosis not present

## 2022-07-05 DIAGNOSIS — R079 Chest pain, unspecified: Secondary | ICD-10-CM | POA: Diagnosis not present

## 2022-07-05 DIAGNOSIS — S0990XA Unspecified injury of head, initial encounter: Secondary | ICD-10-CM | POA: Diagnosis not present

## 2022-07-05 DIAGNOSIS — Y998 Other external cause status: Secondary | ICD-10-CM | POA: Insufficient documentation

## 2022-07-05 DIAGNOSIS — W01198A Fall on same level from slipping, tripping and stumbling with subsequent striking against other object, initial encounter: Secondary | ICD-10-CM | POA: Diagnosis not present

## 2022-07-05 DIAGNOSIS — Y9389 Activity, other specified: Secondary | ICD-10-CM | POA: Insufficient documentation

## 2022-07-05 DIAGNOSIS — S199XXA Unspecified injury of neck, initial encounter: Secondary | ICD-10-CM | POA: Diagnosis not present

## 2022-07-05 DIAGNOSIS — R55 Syncope and collapse: Secondary | ICD-10-CM | POA: Diagnosis not present

## 2022-07-05 DIAGNOSIS — S060X9A Concussion with loss of consciousness of unspecified duration, initial encounter: Secondary | ICD-10-CM | POA: Diagnosis present

## 2022-07-05 LAB — CBC
HCT: 39.5 % (ref 36.0–46.0)
Hemoglobin: 13.3 g/dL (ref 12.0–15.0)
MCH: 28.9 pg (ref 26.0–34.0)
MCHC: 33.7 g/dL (ref 30.0–36.0)
MCV: 85.7 fL (ref 80.0–100.0)
Platelets: 246 10*3/uL (ref 150–400)
RBC: 4.61 MIL/uL (ref 3.87–5.11)
RDW: 13.7 % (ref 11.5–15.5)
WBC: 6.8 10*3/uL (ref 4.0–10.5)
nRBC: 0 % (ref 0.0–0.2)

## 2022-07-05 LAB — BASIC METABOLIC PANEL
Anion gap: 8 (ref 5–15)
BUN: 13 mg/dL (ref 8–23)
CO2: 26 mmol/L (ref 22–32)
Calcium: 9.1 mg/dL (ref 8.9–10.3)
Chloride: 102 mmol/L (ref 98–111)
Creatinine, Ser: 0.94 mg/dL (ref 0.44–1.00)
GFR, Estimated: >60 mL/min (ref >60)
Glucose, Bld: 119 mg/dL — ABNORMAL HIGH (ref 70–99)
Potassium: 4.3 mmol/L (ref 3.5–5.1)
Sodium: 136 mmol/L (ref 135–145)

## 2022-07-05 LAB — CBG MONITORING, ED: Glucose-Capillary: 117 mg/dL — ABNORMAL HIGH (ref 70–99)

## 2022-07-05 MED ORDER — SODIUM CHLORIDE 0.9 % IV BOLUS
500.0000 mL | Freq: Once | INTRAVENOUS | Status: AC
  Administered 2022-07-05: 500 mL via INTRAVENOUS

## 2022-07-05 MED ORDER — ACETAMINOPHEN 500 MG PO TABS
1000.0000 mg | ORAL_TABLET | ORAL | Status: AC
  Administered 2022-07-05: 1000 mg via ORAL
  Filled 2022-07-05: qty 2

## 2022-07-05 NOTE — ED Triage Notes (Signed)
Patient to ED for syncopal episode that occurred this AM. Patient states she was washing dishes when this occurred. Complaining of headaches.

## 2022-07-05 NOTE — Progress Notes (Signed)
I was called about patient with very small chip fracture of C4. This is non structural and recommended cervical collar and follow up in clinic

## 2022-07-05 NOTE — ED Provider Notes (Signed)
[facility] Provider Note    Event Date/Time   First MD Initiated Contact with Patient 07/05/22 1021     (approximate)   History   Loss of Consciousness   HPI  Dawn Alvarez is a 67 y.o. female she also reports a history of breast cancer in remission following with Dr. Tasia Catchings.     Past medical history includes hyperlipidemia COPD diabetes hypertension.  At this morning she estimates sometime around 8 AM. She got up, took her medications including her blood pressure medicine, went to the kitchen and decided to wash dishes.  While standing at the sink she started to feel lightheaded and then woke up on the floor.  She reports she fell out.  She denies any injury except she reports a mild headache over the right side of the scalp and also some soreness around the right upper shoulder.  No numbness or new weakness.  No nausea or vomiting.  No recent illnesses.  No fevers or chills.  Has been her normal health.  No chest pain no shortness of breath.  No abdominal pain.  She reports send her sister also reports that she often does not hydrate well, she is known to get dehydrated, thinks that might of been the case and she took her medications on an empty stomach this morning without eating breakfast.  She is diabetic and used her insulin this morning   Physical Exam   Triage Vital Signs: ED Triage Vitals  Enc Vitals Group     BP 07/05/22 0958 107/78     Pulse Rate 07/05/22 0958 80     Resp 07/05/22 0958 18     Temp 07/05/22 0958 98.2 F (36.8 C)     Temp Source 07/05/22 0958 Oral     SpO2 07/05/22 0958 97 %     Weight --      Height --      Head Circumference --      Peak Flow --      Pain Score 07/05/22 0959 9     Pain Loc --      Pain Edu? --      Excl. in [place]? --     Most recent vital signs: Vitals:   07/05/22 1400 07/05/22 1430  BP: 125/69 130/82  Pulse:  76  Resp: 11 18  Temp:    SpO2:  99%     General: Awake, no distress.  Very  pleasant.  Normocephalic atraumatic [place] reports some mild tenderness over the right parietal region but no step-off deformity or hematoma noted.  Wake removed and examined closely CV:  Good peripheral perfusion.  Normal tones and rate.  Well-perfused extremities.  Normal heart tones. Resp:  Normal effort.  Clear bilaterally.  Normal oxygen saturation.  Normal work of breathing Abd:  No distention.  Soft nontender nondistended Other:  No edema.  No venous cords or congestion  Reports mild midline cervical tenderness.  She also reports tenderness more so along the right paraspinous cervical region and soreness over the right trapezius.  She is able to range the right arm right shoulder and upper extremities well bilaterally.  Strong pulsation normal neurologic function and sensation in the upper extremities bilateral.  Moves lower extremities without deficit or difficulty.  ED Results / Procedures / Treatments   Labs (all labs ordered are listed, but only abnormal results are displayed) Labs Reviewed  BASIC METABOLIC PANEL - Abnormal; Notable for the following components:  Result Value   Glucose, Bld 119 (*)    All other components within normal limits  CBG MONITORING, ED - Abnormal; Notable for the following components:   Glucose-Capillary 117 (*)    All other components within normal limits  CBC  CBG MONITORING, ED   Labs interpreted as normal CBC and comprehensive metabolic panel  EKG  Patient interpreted by me at 1015 heart rate 85 QRS 80 QTc 460 Normal sinus rhythm no evidence of acute ischemia.  Compared to prior EKG from January 24, 2022 no change   RADIOLOGY  Personally reviewed the patient's CT cervical spine imaging for acute abnormality as well as head CT.  I do not see evidence of gross abnormality.  However the radiologist did notes a C4 fracture.  I discussed this send Dr. Lacinda Axon, our neurosurgeon reviewed the imaging as well.  Dr. Lacinda Axon advised that this appears to be  aches very stable, recommends outpatient use of cervical collar and follow-up with neurosurgery in about 2 weeks   PROCEDURES:  Critical Care performed: No  Procedures   MEDICATIONS ORDERED IN ED: Medications  sodium chloride 0.9 % bolus 500 mL (0 mLs Intravenous Stopped 07/05/22 1529)  acetaminophen (TYLENOL) tablet 1,000 mg (1,000 mg Oral Given 07/05/22 1127)     IMPRESSION / MDM / ASSESSMENT AND PLAN / ED COURSE  I reviewed the triage vital signs and the nursing notes.                              Differential diagnosis includes, but is not limited to, syncope, closed head injury, cervical injury, contusion, cervical strain shoulder strain etc., etiology may be orthostatic, dehydration, arrhythmia genic though this seems unlikely, no chest pain no tachycardia no hypoxia no clinical findings of be suggestive of ACS, pulmonary embolism dissection etc.  She is alert well-oriented at this time.  Her labs reassuring no evidence of an acute anemia or metabolic abnormality.  She is currently at her baseline but reports tenderness along the right side of the neck steady across the trapezius towards the right shoulder without obvious traumatic finding on examination.  Additionally a mild headache.    CBC and metabolic panel are both normal.  Patient's presentation is most consistent with acute presentation with potential threat to life or bodily function.   The patient is on the cardiac monitor to evaluate for evidence of arrhythmia and/or significant heart rate changes.   Clinical Course as of 07/05/22 1605  Sat Jul 05, 2022  1430 Discussed cervical spine fracture and precautions, use of cervical collar at all times when out of bed.  Follow-up recommendations and careful return precautions.  Patient reports the pain in her neck is greatly improved she is able to move her upper extremities without any difficulty or weakness.  She is resting comfortably at this time. [MQ]    Clinical  Course User Index [MQ] Delman Kitten, MD   Patient up ambulating well feeling improved ready to go home.  Nursing has ambulated the patient without difficulty.  Suspect mild dehydration and at this point suspect her syncopal episode most likely related to potential orthostatic causation possibly related to use of her medications not eating or drinking anything this morning in standing position.  She is awake alert in no distress understands plan for use of cervical collar and to follow-up with Dr. Lacinda Axon or neurosurgery upcoming in 1 to 2 weeks.  Careful return precautions advised.  Sister  here and will be escorting her home.  The patient does not drive  Return precautions and treatment recommendations and follow-up discussed with the patient who is agreeable with the plan.   FINAL CLINICAL IMPRESSION(S) / ED DIAGNOSES   Final diagnoses:  Other closed nondisplaced fracture of fourth cervical vertebra, initial encounter ([place])     Rx / DC Orders   ED Discharge Orders     None        Note:  This document was prepared using Dragon voice recognition software and may include unintentional dictation errors.   Delman Kitten, MD 07/05/22 [phone]

## 2022-07-05 NOTE — ED Triage Notes (Signed)
Pt presents via wheelchair from [facility] c/o syncopal episode this am at home. Pt reports woke up this am around 0830 and reports syncopal episode occurred around 0900-0930. Pt reports unknown if she hit her head. Reports headache per report. Denies chest pain or SOB. Denies recent illness. Hx DM

## 2022-07-05 NOTE — ED Notes (Signed)
Pt verbalized understanding of discharge instructions.

## 2022-07-07 ENCOUNTER — Encounter: Payer: Self-pay | Admitting: Oncology

## 2022-07-07 ENCOUNTER — Inpatient Hospital Stay: Payer: 59

## 2022-07-07 ENCOUNTER — Inpatient Hospital Stay: Payer: 59 | Attending: Oncology | Admitting: Oncology

## 2022-07-07 VITALS — BP 134/82 | HR 76 | Temp 96.3°F | Wt 146.9 lb

## 2022-07-07 DIAGNOSIS — Z171 Estrogen receptor negative status [ER-]: Secondary | ICD-10-CM

## 2022-07-07 DIAGNOSIS — G620 Drug-induced polyneuropathy: Secondary | ICD-10-CM

## 2022-07-07 DIAGNOSIS — I890 Lymphedema, not elsewhere classified: Secondary | ICD-10-CM | POA: Diagnosis not present

## 2022-07-07 DIAGNOSIS — T451X5A Adverse effect of antineoplastic and immunosuppressive drugs, initial encounter: Secondary | ICD-10-CM | POA: Diagnosis not present

## 2022-07-07 DIAGNOSIS — C50411 Malignant neoplasm of upper-outer quadrant of right female breast: Secondary | ICD-10-CM | POA: Diagnosis not present

## 2022-07-07 DIAGNOSIS — Z95828 Presence of other vascular implants and grafts: Secondary | ICD-10-CM | POA: Diagnosis not present

## 2022-07-07 DIAGNOSIS — Z87891 Personal history of nicotine dependence: Secondary | ICD-10-CM | POA: Insufficient documentation

## 2022-07-07 DIAGNOSIS — Z79899 Other long term (current) drug therapy: Secondary | ICD-10-CM | POA: Diagnosis not present

## 2022-07-07 DIAGNOSIS — Z760 Encounter for issue of repeat prescription: Secondary | ICD-10-CM

## 2022-07-07 LAB — CBC WITH DIFFERENTIAL/PLATELET
Abs Immature Granulocytes: 0.08 10*3/uL — ABNORMAL HIGH (ref 0.00–0.07)
Basophils Absolute: 0.1 10*3/uL (ref 0.0–0.1)
Basophils Relative: 1 %
Eosinophils Absolute: 0.2 10*3/uL (ref 0.0–0.5)
Eosinophils Relative: 2 %
HCT: 39.2 % (ref 36.0–46.0)
Hemoglobin: 13.1 g/dL (ref 12.0–15.0)
Immature Granulocytes: 1 %
Lymphocytes Relative: 24 %
Lymphs Abs: 1.7 10*3/uL (ref 0.7–4.0)
MCH: 28.9 pg (ref 26.0–34.0)
MCHC: 33.4 g/dL (ref 30.0–36.0)
MCV: 86.3 fL (ref 80.0–100.0)
Monocytes Absolute: 0.7 10*3/uL (ref 0.1–1.0)
Monocytes Relative: 10 %
Neutro Abs: 4.3 10*3/uL (ref 1.7–7.7)
Neutrophils Relative %: 62 %
Platelets: 254 10*3/uL (ref 150–400)
RBC: 4.54 MIL/uL (ref 3.87–5.11)
RDW: 13.6 % (ref 11.5–15.5)
WBC: 7 10*3/uL (ref 4.0–10.5)
nRBC: 0 % (ref 0.0–0.2)

## 2022-07-07 LAB — COMPREHENSIVE METABOLIC PANEL
ALT: 21 U/L (ref 0–44)
AST: 24 U/L (ref 15–41)
Albumin: 3.9 g/dL (ref 3.5–5.0)
Alkaline Phosphatase: 42 U/L (ref 38–126)
Anion gap: 9 (ref 5–15)
BUN: 19 mg/dL (ref 8–23)
CO2: 26 mmol/L (ref 22–32)
Calcium: 9.4 mg/dL (ref 8.9–10.3)
Chloride: 102 mmol/L (ref 98–111)
Creatinine, Ser: 1.19 mg/dL — ABNORMAL HIGH (ref 0.44–1.00)
GFR, Estimated: 50 mL/min — ABNORMAL LOW (ref >60)
Glucose, Bld: 125 mg/dL — ABNORMAL HIGH (ref 70–99)
Potassium: 4.3 mmol/L (ref 3.5–5.1)
Sodium: 137 mmol/L (ref 135–145)
Total Bilirubin: 0.5 mg/dL (ref 0.3–1.2)
Total Protein: 7.7 g/dL (ref 6.5–8.1)

## 2022-07-07 MED ORDER — HEPARIN SOD (PORK) LOCK FLUSH 100 UNIT/ML IV SOLN
500.0000 [IU] | Freq: Once | INTRAVENOUS | Status: AC
  Administered 2022-07-07: 500 [IU] via INTRAVENOUS
  Filled 2022-07-07: qty 5

## 2022-07-07 MED ORDER — GABAPENTIN 300 MG PO CAPS: ORAL_CAPSULE | ORAL | 3 refills | Status: DC

## 2022-07-07 MED ORDER — SODIUM CHLORIDE 0.9% FLUSH
10.0000 mL | Freq: Once | INTRAVENOUS | Status: AC
  Administered 2022-07-07: 10 mL via INTRAVENOUS
  Filled 2022-07-07: qty 10

## 2022-07-07 NOTE — Assessment & Plan Note (Signed)
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  ER negative, PR weakly positive (<=10%), HER-2 negative, s/p neoadjuvant dose dense AC followed by 12 weekly Taxol. S/p  lumpectomy/sentinel lymph node biopsy ypTis ypN0 No residual invasive carcinoma.  Status post adjuvant radiation. Genetic testing negative.  Labs are reviewed and discussed with patient. Clinically she is doing well. Physical examination showed no signs of recurrence.  Continue annual mammogram- June 2024   Right breast lymphedema, skin hyperpigmentation Follow up with physical therapy/lymphedema clinic.

## 2022-07-07 NOTE — Progress Notes (Signed)
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  ER negative, PR weakly positive (<=10%), HER-2 negative, s/p neoadjuvant dose dense AC followed by 12 weekly Taxol. S/p  lumpectomy/sentinel lymph node biopsy ypTis ypN0 No residual invasive carcinoma.  Status post adjuvant radiation. Genetic testing negative.  Labs are reviewed and discussed with patient. Clinically she is doing well. Physical examination showed no signs of recurrence.  Continue annual mammogram- June 2024   Right breast lymphedema, skin hyperpigmentation Follow up with physical therapy/lymphedema clinic.  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  Symptoms are stable and slightly improved.  Continue gabapentin 100 mg 3 times daily   Port-A-Cath in place we discussed the option of removal Mediport.  Patient prefers to keep Mediport until next visit and decide. Continue port flush every 8 weeks  Orders Placed This Encounter  Procedures   MM DIAG BREAST TOMO BILATERAL    Standing Status:   Future    Standing Expiration Date:   07/08/2023    Order Specific Question:   Reason for Exam (SYMPTOM  OR DIAGNOSIS REQUIRED)    Answer:   hx breast cancer, Radiologist recommends Diagnostic mammo    Order Specific Question:   Preferred imaging location?    Answer:   [facility]   US BREAST LTD UNI RIGHT INC AXILLA    Standing Status:   Future    Standing Expiration Date:   07/08/2023    Order Specific Question:    Reason for Exam (SYMPTOM  OR DIAGNOSIS REQUIRED)    Answer:   hx breast cancer, Radiologist recommends Diagnostic mammo    Order Specific Question:   Preferred imaging location?    Answer:   [facility]   US BREAST LTD UNI LEFT INC AXILLA    Standing Status:   Future    Standing Expiration Date:   07/08/2023    Order Specific Question:   Reason for Exam (SYMPTOM  OR DIAGNOSIS REQUIRED)    Answer:   hx breast cancer, Radiologist recommends Diagnostic mammo    Order Specific Question:   Preferred imaging location?    Answer:   [facility]   Cancer antigen 27.29    Standing Status:   Future    Standing Expiration Date:   07/08/2023   Cancer antigen 15-3    Standing Status:   Future    Standing Expiration Date:   07/08/2023   CBC with Differential/Platelet    Standing Status:   Future    Standing Expiration Date:   07/08/2023   Comprehensive metabolic panel    Standing Status:   Future    Standing Expiration Date:   07/07/2023   Follow up in 6 months All questions were answered. The patient knows to call the clinic with any problems, questions or concerns.  Earlie Server, MD, PhD [facility] Hematology Oncology 07/07/2022   HISTORY OF PRESENTING ILLNESS:  Patient had screening mammogram done 08/12/2017 which showed right breast asymmetry.  A diagnostic mammogram was suggested and patient did not have it done. Patient felt her right breast mass  for a few weeks.  She had bilateral diagnostic mammogram done on 06/30/2019. 2.8 x 2.4 x 2.6 cm right breast mass, 11:00, 10 cm from the nipple.  There is a single mildly abnormal node in the right axilla with a cortex measuring up to 4.4 mm.  No other suspicious findings. Patient underwent ultrasound-guided core biopsy of the right breast mass and right axilla lymph node Pathology showed invasive mammary carcinoma, no special type, grade 3, ER/PR HER-2 status are pending. Right axillary lymph node biopsy showed predominantly blood in the  fibroadipose tissue, with scant lymphoid tissue present.  No definite malignancy was identified.  Patient was referred to cancer center to establish care and discuss treatment plan. Menarche 89 Postmenopausal.  LMP when she was 67 years old. She recalls use of birth control pills. Denies any hormone .  Replacement therapy. Denies any prior chest radiation. She reports family history of maternal grandmother and 2 maternal cousins were diagnosed with cancer.  She does not know about details. # 09/03/2019, patient had an episode of syncope and EMS was called and patient sent to emergency room.  Patient was noted to have a heart rate 45-65 and blood glucose level of 286. Patient denies any history of nausea, vomiting, diarrhea.  Denies any dehydration.  She reported history of intermittent abdominal pain. CT head without contrast showed no acute intracranial abnormality.  CT abdomen pelvis with contrast is negative for evidence of acute abdominal abnormality.  Patient was given IV fluid. Patient was observed with holding her blood pressure medication including amlodipine, bisoprolol.  No arrhythmia was noted on telemetry.  Echocardiogram showed LVEF 60 to 65%.  No valvular abnormalities.  Beta-blocker and diuretics was discontinued.  # Patient was seen by cardiology Dr. Garen Lah for evaluation of syncope and was cleared for chemotherapy. # 10/03/2019, interval unilateral right diagnostic mammogram showed right breast mass 11:00 size has decreased to 3.7 x 1.3 x 2.6 cm.  Previously mass measured 2.8 x 2.4 x 2.6 cm  Neoadjuvant chemotherapy  08/04/2019-09/21/2019 dose dense AC 10/11/2019-01/02/2020 weekly Taxol mammogram after chemotherapy showed decreased size of cancer at that time and was sent back to surgeon for surgery. Patient no showed to Dr. Ethlyn Gallery office in September 2021  03/16/2020 bilateral breast MRI with and without contrast showed previously biopsied mass in the posterior aspect of the upper  outer quadrant of the right breast is no longer visualized.  There is a small amount of low-grade linear enhancement extending posteriorly and superiorly from the location towards the right axilla.  Measuring 5.2 x 2.0 x 1.1 cm.  No evidence of malignancy elsewhere in either breast.  No adenopathy.  11/03/2019 genetic testing is negative   # 05/04/2020 right lumpectomy SLNB ypTis ypN0 High-grade DCIS with calcifications, 1 cm, no residual invasive carcinoma.  Margins not involved.  Extensive fibrosis with patchy mild chronic inflammation.  Patient had 5 sentinel lymph node excision and all lymph nodes were negative. Margins were negative  #Adjuvant radiation finished on 08/23/2020. INTERVAL HISTORY MAKYNNA MANOCCHIO is a 67 y.o. female who has above history reviewed by me today presents for follow up visit for management of breast cancer.  Patient reports feeling well.  She does breast examination and has no new breast complaints. Chemotherapy-induced neuropathy has improved. No new breast complaints.  . Review of Systems  Constitutional:  Negative for appetite change, chills, fatigue and fever.  HENT:   Negative for hearing loss and voice change.   Eyes:  Negative for eye  problems.  Respiratory:  Negative for chest tightness and cough.   Cardiovascular:  Negative for chest pain.  Gastrointestinal:  Negative for abdominal distention, abdominal pain and blood in stool.  Endocrine: Negative for hot flashes.  Genitourinary:  Negative for difficulty urinating and frequency.   Musculoskeletal:  Positive for arthralgias.  Skin:  Negative for itching and rash.  Neurological:  Positive for numbness. Negative for extremity weakness.  Hematological:  Negative for adenopathy.  Psychiatric/Behavioral:  Negative for confusion.     MEDICAL HISTORY:  Past Medical History:  Diagnosis Date   Asthma    Cancer ([place]) 03/2020   right breast IMC   COPD exacerbation ([place]) 04/12/2016   Diabetes mellitus  without complication (HCC)    Hyperlipemia    Hypertension    Personal history of chemotherapy 2021    SURGICAL HISTORY: Past Surgical History:  Procedure Laterality Date   BREAST BIOPSY Right 07/05/2019   [place] bx venus marker, grade 3 invasive mammary carcinoma   BREAST BIOPSY Right 07/05/2019   LN bx, hydromarker,PREDOMINANTLY BLOOD AND FIBROADIPOSE TISSUE, WITH SCANT LYMPHOID TISSUE PRESENT       BREAST LUMPECTOMY Right 05/04/2020   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;  Service: General;  Laterality: Right;   CESAREAN SECTION     COLONOSCOPY WITH PROPOFOL N/A 09/11/2015   Procedure: COLONOSCOPY WITH PROPOFOL;  Surgeon: Lucilla Lame, MD;  Location: [facility] END[facility];  Service: Endoscopy;  Laterality: N/A;   IR IMAGING GUIDED PORT INSERTION  07/20/2019    SOCIAL HISTORY: Social History   Socioeconomic History   Marital status: Married    Spouse name: Not on file   Number of children: Not on file   Years of education: Not on file   Highest education level: Not on file  Occupational History   Not on file  Tobacco Use   Smoking status: Former    Packs/day: 1.00    Years: 2.00    Total pack years: 2.00    Types: Cigarettes    Quit date: 02/29/2020    Years since quitting: 2.3   Smokeless tobacco: Never  Substance and Sexual Activity   Alcohol use: Yes    Comment: social   Drug use: No   Sexual activity: Not Currently    Birth control/protection: Post-menopausal  Other Topics Concern   Not on file  Social History Narrative   Not on file   Social Determinants of Health   Financial Resource Strain: Low Risk  (10/24/2020)   Overall Financial Resource Strain (CARDIA)    Difficulty of  Paying Living Expenses: Not very hard  Food Insecurity: Not on file  Transportation Needs: Not on file  Physical Activity: Not on file  Stress: Not on file  Social Connections: Not on file  Intimate Partner Violence: Not on file    FAMILY HISTORY: Family History  Problem Relation Age of Onset   Diabetes Mother    Hypertension Mother    Hypertension Father    Diabetes Father    Breast cancer Neg Hx     ALLERGIES:  has No Known Allergies.  MEDICATIONS:  Current Outpatient Medications  Medication Sig Dispense Refill   Alcohol Swabs (B-D SINGLE USE SWABS REGULAR) PADS Use as directed twice  a day 300 each 3   allopurinol (ZYLOPRIM) 100 MG tablet Take 1 tablet (100 mg total) by mouth at bedtime. Take 1 tablet by mouth at night for Gout 90 tablet 3   amLODipine (NORVASC) 10 MG tablet Take 1 tablet (10 mg total) by mouth daily. 90 tablet 1   atorvastatin (LIPITOR) 20 MG tablet Take 1 tablet (20 mg total) by mouth daily. 90 tablet 1   bisoprolol (ZEBETA) 5 MG tablet Take 1 tablet (5 mg total) by mouth daily. 30 tablet 2   Blood Glucose Monitoring Suppl (TRUE METRIX AIR GLUCOSE METER) w/Device KIT 1 Device by Does not apply route in the morning and at bedtime. Use ad directed e11.65 1 kit 0   brimonidine (ALPHAGAN) 0.2 % ophthalmic solution INSTILL 1 DROP INTO BOTH EYES  TWICE DAILY 30 mL 2   Calcium Carb-Cholecalciferol (CALCIUM 600 + D) 600-200 MG-UNIT TABS Take 1 tablet by mouth daily.     celecoxib (CELEBREX) 200 MG capsule Take one tab po qd for one week and then as needed 90 capsule 1   colchicine 0.6 MG tablet TAKE FOR A GOUT FLARE/ATTACK:  TAKE 2 TABLETS BY MOUTH THEN  TAKE 1 MORE TABLET 1 HOUR LATER  ON DAY 1, THEN TAKE 1 TABLET BY  MOUTH DAILY UNTIL IT RESOLVES 100 tablet 2   cyclobenzaprine (FLEXERIL) 10 MG tablet Take 1 tablet (10 mg total) by mouth at bedtime as needed for muscle spasms. 15 tablet 0   glucose blood (TRUE METRIX BLOOD GLUCOSE TEST) test strip Use as instructed  twice a daily diag e11.65 100 each 3   HYDROcodone-acetaminophen (NORCO/VICODIN) 5-325 MG tablet Take 1 tablet by mouth every 6 (six) hours as needed for moderate pain or severe pain. 20 tablet 0   insulin detemir (LEVEMIR FLEXTOUCH) 100 UNIT/ML FlexPen Inject 30 Units into the skin daily. ALONG WITH NEEDLES 90 mL 1   Insulin Pen Needle (PEN NEEDLES) 31G X 8 MM MISC Use as directed with insulin E11.65 300 each 1   Lancets Misc. (ACCU-CHEK MULTICLIX LANCET DEV) KIT by Does not apply route. Use as directed twice a day diag E11.65     lidocaine-prilocaine (EMLA) cream SMARTSIG:1 Topical Every Night     lisinopril (ZESTRIL) 40 MG tablet Take 1 tablet by mouth once daily 90 tablet 0   nicotine polacrilex ([employer] NICOTINE) 2 MG gum Take 1 each (2 mg total) by mouth as needed for smoking cessation. 100 tablet 0   Omega-3 Fatty Acids (FISH OIL) 1000 MG CAPS Take 1,000 mg by mouth daily.      Semaglutide, 1 MG/DOSE, (OZEMPIC, 1 MG/DOSE,) 2 MG/1.5ML SOPN Inject 1 mg into the skin once a week. FOR DM 6 mL 6   SIMBRINZA 1-0.2 % SUSP      TRUEplus Lancets 30G MISC Use as directed twice daily diag e11.65 100 each 3   gabapentin (NEURONTIN) 300 MG capsule Take 1 capsule by mouth in the morning, 1 capsule by mouth at lunch/midday, and 2 capsule by mouth at bedtime. 120 capsule 3   No current facility-administered medications for this visit.   Facility-Administered Medications Ordered in Other Visits  Medication Dose Route Frequency Provider Last Rate Last Admin   heparin lock flush 100 UNIT/ML injection            sodium chloride flush (NS) 0.9 % injection 10 mL  10 mL Intravenous PRN Earlie Server, MD   10 mL at 10/11/19 0817     PHYSICAL EXAMINATION:  ECOG PERFORMANCE STATUS: 1 - Symptomatic but completely ambulatory Vitals:   07/07/22 1339  BP: 134/82  Pulse: 76  Temp: (!) 96.3 F (35.7 C)  SpO2: 100%   Filed Weights   07/07/22 1339  Weight: 146 lb 14.4 oz (66.6 kg)    Physical Exam Constitutional:       General: She is not in acute distress. HENT:     Head: Normocephalic and atraumatic.  Eyes:     General: No scleral icterus. Cardiovascular:     Rate and Rhythm: Normal rate and regular rhythm.     Heart sounds: Normal heart sounds.  Pulmonary:     Effort: Pulmonary effort is normal. No respiratory distress.     Breath sounds: Normal breath sounds. No wheezing.  Abdominal:     General: Bowel sounds are normal. There is no distension.     Palpations: Abdomen is soft.  Musculoskeletal:        General: No deformity. Normal range of motion.     Cervical back: Normal range of motion and neck supple.  Skin:    General: Skin is warm and dry.     Findings: No erythema or rash.  Neurological:     Mental Status: She is alert and oriented to person, place, and time. Mental status is at baseline.     Cranial Nerves: No cranial nerve deficit.     Coordination: Coordination normal.  Psychiatric:        Mood and Affect: Mood normal.       LABORATORY DATA:  I have reviewed the data as listed     Latest Ref Rng & Units 07/07/2022   12:57 PM 07/05/2022   10:04 AM 03/28/2022   11:50 AM  CBC  WBC 4.0 - 10.5 K/uL 7.0  6.8  5.1   Hemoglobin 12.0 - 15.0 g/dL 13.1  13.3  12.1   Hematocrit 36.0 - 46.0 % 39.2  39.5  36.3   Platelets 150 - 400 K/uL 254  246  212       Latest Ref Rng & Units 07/07/2022   12:57 PM 07/05/2022   10:04 AM 03/28/2022   11:50 AM  CMP  Glucose 70 - 99 mg/dL 125  119  114   BUN 8 - 23 mg/dL '19  13  17   '$ Creatinine 0.44 - 1.00 mg/dL 1.19  0.94  0.78   Sodium 135 - 145 mmol/L 137  136  137   Potassium 3.5 - 5.1 mmol/L 4.3  4.3  3.6   Chloride 98 - 111 mmol/L 102  102  106   CO2 22 - 32 mmol/L '26  26  23   '$ Calcium 8.9 - 10.3 mg/dL 9.4  9.1  8.7   Total Protein 6.5 - 8.1 g/dL 7.7   7.1   Total Bilirubin 0.3 - 1.2 mg/dL 0.5   0.4   Alkaline Phos 38 - 126 U/L 42   43   AST 15 - 41 U/L 24   22   ALT 0 - 44 U/L 21   18        RADIOGRAPHIC STUDIES: I have  personally reviewed the radiological images as listed and agreed with the findings in the report. CT Head Wo Contrast  Result Date: 07/05/2022 CLINICAL DATA:  Facialtrauma EXAM: CT HEAD WITHOUT CONTRAST CT CERVICAL SPINE WITHOUT CONTRAST TECHNIQUE: Multidetector CT imaging of the head and cervical spine was performed following the standard protocol without intravenous contrast. Multiplanar CT image reconstructions of  the cervical spine were also generated. RADIATION DOSE REDUCTION: This exam was performed according to the departmental dose-optimization program which includes automated exposure control, adjustment of the mA and/or kV according to patient size and/or use of iterative reconstruction technique. COMPARISON:  CT Head 09/03/19 FINDINGS: CT HEAD FINDINGS Brain: No evidence of acute infarction, hemorrhage, hydrocephalus, extra-axial collection or mass lesion/mass effect. There is a extensive periventricular hypodense signal abnormality, favored to represent sequela of chronic microvascular ischemic change. Vascular: No disproportionately hyperdense vessel or unexpected calcification. Skull: Normal. Negative for fracture or focal lesion. Sinuses/Orbits: No acute finding. Other: None. CT CERVICAL SPINE FINDINGS Alignment: Normal. Skull base and vertebrae: There is a mildly displaced fracture of the superior articular process C4 on the right (series 5, image 19). Soft tissues and spinal canal: No prevertebral fluid or swelling. No visible canal hematoma. Disc levels:  No evidence of high-grade spinal canal stenosis. Upper chest: Not well-visualized. Other: Multinodular thyroid goiter.  Atherosclerotic calcifications. IMPRESSION: 1. No CT evidence of intracranial injury. 2. Age indeterminate mildly displaced fracture of the superior articular process of C4 on the right. Electronically Signed   By: Marin Roberts M.D.   On: 07/05/2022 12:08   CT Cervical Spine Wo Contrast  Result Date: 07/05/2022 CLINICAL DATA:   Facialtrauma EXAM: CT HEAD WITHOUT CONTRAST CT CERVICAL SPINE WITHOUT CONTRAST TECHNIQUE: Multidetector CT imaging of the head and cervical spine was performed following the standard protocol without intravenous contrast. Multiplanar CT image reconstructions of the cervical spine were also generated. RADIATION DOSE REDUCTION: This exam was performed according to the departmental dose-optimization program which includes automated exposure control, adjustment of the mA and/or kV according to patient size and/or use of iterative reconstruction technique. COMPARISON:  CT Head 09/03/19 FINDINGS: CT HEAD FINDINGS Brain: No evidence of acute infarction, hemorrhage, hydrocephalus, extra-axial collection or mass lesion/mass effect. There is a extensive periventricular hypodense signal abnormality, favored to represent sequela of chronic microvascular ischemic change. Vascular: No disproportionately hyperdense vessel or unexpected calcification. Skull: Normal. Negative for fracture or focal lesion. Sinuses/Orbits: No acute finding. Other: None. CT CERVICAL SPINE FINDINGS Alignment: Normal. Skull base and vertebrae: There is a mildly displaced fracture of the superior articular process C4 on the right (series 5, image 19). Soft tissues and spinal canal: No prevertebral fluid or swelling. No visible canal hematoma. Disc levels:  No evidence of high-grade spinal canal stenosis. Upper chest: Not well-visualized. Other: Multinodular thyroid goiter.  Atherosclerotic calcifications. IMPRESSION: 1. No CT evidence of intracranial injury. 2. Age indeterminate mildly displaced fracture of the superior articular process of C4 on the right. Electronically Signed   By: Marin Roberts M.D.   On: 07/05/2022 12:08   DG Chest 2 View  Result Date: 07/05/2022 CLINICAL DATA:  Syncopal episode, fall and right shoulder pain. EXAM: CHEST - 2 VIEW COMPARISON:  01/24/2022 FINDINGS: The heart size and mediastinal contours are within normal limits.  Stable positioning of Port-A-Cath. There is no evidence of pulmonary edema, consolidation, pneumothorax, nodule or pleural fluid. The visualized skeletal structures are unremarkable. Stable clips in the right axilla related to prior breast surgery and lymph node dissection. IMPRESSION: No active cardiopulmonary disease. Electronically Signed   By: Aletta Edouard M.D.   On: 07/05/2022 11:49

## 2022-07-07 NOTE — Assessment & Plan Note (Signed)
we discussed the option of removal Mediport.  Patient prefers to keep Mediport until next visit and decide. Continue port flush every 8 weeks

## 2022-07-07 NOTE — Assessment & Plan Note (Deleted)
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  ER negative, PR weakly positive (<=10%), HER-2 negative, s/p neoadjuvant dose dense AC followed by 12 weekly Taxol. S/p  lumpectomy/sentinel lymph node biopsy ypTis ypN0 No residual invasive carcinoma.  Status post adjuvant radiation. Genetic testing negative.  Labs are reviewed and discussed with patient. Clinically she is doing well. Physical examination showed no signs of recurrence.  Continue annual mammogram- June 2024   Right breast lymphedema, skin hyperpigmentation Follow up with physical therapy/lymphedema clinic.

## 2022-07-07 NOTE — Assessment & Plan Note (Signed)
#  Chemotherapy induced neuropathy, grade 1-2.  Symptoms are stable and slightly improved.  Continue gabapentin 100 mg 3 times daily

## 2022-07-08 LAB — CANCER ANTIGEN 15-3: CA 15-3: 12.4 U/mL (ref 0.0–25.0)

## 2022-07-08 LAB — CANCER ANTIGEN 27.29: CA 27.29: 11.6 U/mL (ref 0.0–38.6)

## 2022-07-10 ENCOUNTER — Telehealth: Payer: Self-pay

## 2022-07-10 ENCOUNTER — Encounter: Payer: Self-pay | Admitting: Nurse Practitioner

## 2022-07-10 ENCOUNTER — Ambulatory Visit (INDEPENDENT_AMBULATORY_CARE_PROVIDER_SITE_OTHER): Payer: 59 | Admitting: Nurse Practitioner

## 2022-07-10 VITALS — BP 113/63 | HR 90 | Temp 97.7°F | Resp 16 | Ht 61.0 in | Wt 149.4 lb

## 2022-07-10 DIAGNOSIS — Z794 Long term (current) use of insulin: Secondary | ICD-10-CM

## 2022-07-10 DIAGNOSIS — I10 Essential (primary) hypertension: Secondary | ICD-10-CM | POA: Diagnosis not present

## 2022-07-10 DIAGNOSIS — M5441 Lumbago with sciatica, right side: Secondary | ICD-10-CM

## 2022-07-10 DIAGNOSIS — M159 Polyosteoarthritis, unspecified: Secondary | ICD-10-CM

## 2022-07-10 DIAGNOSIS — M10072 Idiopathic gout, left ankle and foot: Secondary | ICD-10-CM

## 2022-07-10 DIAGNOSIS — E1140 Type 2 diabetes mellitus with diabetic neuropathy, unspecified: Secondary | ICD-10-CM | POA: Diagnosis not present

## 2022-07-10 DIAGNOSIS — M5442 Lumbago with sciatica, left side: Secondary | ICD-10-CM

## 2022-07-10 DIAGNOSIS — M150 Primary generalized (osteo)arthritis: Secondary | ICD-10-CM

## 2022-07-10 LAB — POCT GLYCOSYLATED HEMOGLOBIN (HGB A1C): Hemoglobin A1C: 6.3 % — AB (ref 4.0–5.6)

## 2022-07-10 MED ORDER — COLCHICINE 0.6 MG PO TABS: ORAL_TABLET | ORAL | 2 refills | Status: DC

## 2022-07-10 MED ORDER — BISOPROLOL FUMARATE 5 MG PO TABS: 5.0000 mg | ORAL_TABLET | Freq: Every day | ORAL | 3 refills | Status: DC

## 2022-07-10 MED ORDER — HYDROCODONE-ACETAMINOPHEN 5-325 MG PO TABS: 1.0000 | ORAL_TABLET | Freq: Four times a day (QID) | ORAL | 0 refills | Status: DC | PRN

## 2022-07-10 MED ORDER — CELECOXIB 200 MG PO CAPS: ORAL_CAPSULE | ORAL | 1 refills | Status: DC

## 2022-07-10 MED ORDER — CYCLOBENZAPRINE HCL 10 MG PO TABS: 10.0000 mg | ORAL_TABLET | Freq: Every evening | ORAL | 0 refills | Status: DC | PRN

## 2022-07-10 MED ORDER — AMLODIPINE BESYLATE 10 MG PO TABS: 10.0000 mg | ORAL_TABLET | Freq: Every day | ORAL | 1 refills | Status: DC

## 2022-07-10 MED ORDER — ALLOPURINOL 100 MG PO TABS: 100.0000 mg | ORAL_TABLET | Freq: Every evening | ORAL | 3 refills | Status: DC

## 2022-07-10 MED ORDER — LISINOPRIL 40 MG PO TABS: 40.0000 mg | ORAL_TABLET | Freq: Every day | ORAL | 3 refills | Status: DC

## 2022-07-10 NOTE — Telephone Encounter (Signed)
The ER called Dr Lacinda Axon about this patient on 07/05/22.   Per message from Dr Lacinda Axon: "presented with  tiny fracture of SAP at C4, no structural. Placed in collar and recommended outpatient follow up."

## 2022-07-10 NOTE — Progress Notes (Signed)
[facility] [place], [place] [address]  Internal MEDICINE  Office Visit Note  Patient Name: Dawn Alvarez  [id number]  [id number]  Date of Service: 07/10/2022  Chief Complaint  Patient presents with   Follow-up   Hypertension   Hyperlipidemia   Diabetes    HPI [name] presents for a follow-up visit for diabetes, osteoarthritis, hypertension and gout.  Diabetes -- A1c stable, increased by 0.1 to 6.3 Recent fall d/t dizziness, has injury to spinal cord, wearing a neck brace.  Hypertension -- controlled with current medication.  Osteoarthritis -- multiple joints, takes celebrex, muscle relaxer.    Current Medication: Outpatient Encounter Medications as of 07/10/2022  Medication Sig   Alcohol Swabs (B-D SINGLE USE SWABS REGULAR) PADS Use as directed twice a day   atorvastatin (LIPITOR) 20 MG tablet Take 1 tablet (20 mg total) by mouth daily.   Blood Glucose Monitoring Suppl (TRUE METRIX AIR GLUCOSE METER) w/Device KIT 1 Device by Does not apply route in the morning and at bedtime. Use ad directed e11.65   brimonidine (ALPHAGAN) 0.2 % ophthalmic solution INSTILL 1 DROP INTO BOTH EYES  TWICE DAILY   Calcium Carb-Cholecalciferol (CALCIUM 600 + D) 600-200 MG-UNIT TABS Take 1 tablet by mouth daily.   gabapentin (NEURONTIN) 300 MG capsule Take 1 capsule by mouth in the morning, 1 capsule by mouth at lunch/midday, and 2 capsule by mouth at bedtime.   glucose blood (TRUE METRIX BLOOD GLUCOSE TEST) test strip Use as instructed twice a daily diag e11.65   insulin detemir (LEVEMIR FLEXTOUCH) 100 UNIT/ML FlexPen Inject 30 Units into the skin daily. ALONG WITH NEEDLES   Insulin Pen Needle (PEN NEEDLES) 31G X 8 MM MISC Use as directed with insulin E11.65   Lancets Misc. (ACCU-CHEK MULTICLIX LANCET DEV) KIT by Does not apply route. Use as directed twice a day diag E11.65   lidocaine-prilocaine (EMLA) cream SMARTSIG:1 Topical Every Night   nicotine polacrilex ([employer]  NICOTINE) 2 MG gum Take 1 each (2 mg total) by mouth as needed for smoking cessation.   Omega-3 Fatty Acids (FISH OIL) 1000 MG CAPS Take 1,000 mg by mouth daily.    Semaglutide, 1 MG/DOSE, (OZEMPIC, 1 MG/DOSE,) 2 MG/1.5ML SOPN Inject 1 mg into the skin once a week. FOR DM   SIMBRINZA 1-0.2 % SUSP    TRUEplus Lancets 30G MISC Use as directed twice daily diag e11.65   [DISCONTINUED] allopurinol (ZYLOPRIM) 100 MG tablet Take 1 tablet (100 mg total) by mouth at bedtime. Take 1 tablet by mouth at night for Gout   [DISCONTINUED] amLODipine (NORVASC) 10 MG tablet Take 1 tablet (10 mg total) by mouth daily.   [DISCONTINUED] bisoprolol (ZEBETA) 5 MG tablet Take 1 tablet (5 mg total) by mouth daily.   [DISCONTINUED] celecoxib (CELEBREX) 200 MG capsule Take one tab po qd for one week and then as needed   [DISCONTINUED] colchicine 0.6 MG tablet TAKE FOR A GOUT FLARE/ATTACK:  TAKE 2 TABLETS BY MOUTH THEN  TAKE 1 MORE TABLET 1 HOUR LATER  ON DAY 1, THEN TAKE 1 TABLET BY  MOUTH DAILY UNTIL IT RESOLVES   [DISCONTINUED] cyclobenzaprine (FLEXERIL) 10 MG tablet Take 1 tablet (10 mg total) by mouth at bedtime as needed for muscle spasms.   [DISCONTINUED] HYDROcodone-acetaminophen (NORCO/VICODIN) 5-325 MG tablet Take 1 tablet by mouth every 6 (six) hours as needed for moderate pain or severe pain.   [DISCONTINUED] lisinopril (ZESTRIL) 40 MG tablet Take 1 tablet by mouth once daily  allopurinol (ZYLOPRIM) 100 MG tablet Take 1 tablet (100 mg total) by mouth at bedtime. Take 1 tablet by mouth at night for Gout   amLODipine (NORVASC) 10 MG tablet Take 1 tablet (10 mg total) by mouth daily.   bisoprolol (ZEBETA) 5 MG tablet Take 1 tablet (5 mg total) by mouth daily.   celecoxib (CELEBREX) 200 MG capsule Take one tab po qd for one week and then as needed   colchicine 0.6 MG tablet TAKE FOR A GOUT FLARE/ATTACK:  TAKE 2 TABLETS BY MOUTH THEN  TAKE 1 MORE TABLET 1 HOUR LATER  ON DAY 1, THEN TAKE 1 TABLET BY  MOUTH DAILY UNTIL  IT RESOLVES   cyclobenzaprine (FLEXERIL) 10 MG tablet Take 1 tablet (10 mg total) by mouth at bedtime as needed for muscle spasms.   HYDROcodone-acetaminophen (NORCO/VICODIN) 5-325 MG tablet Take 1 tablet by mouth every 6 (six) hours as needed for moderate pain or severe pain.   lisinopril (ZESTRIL) 40 MG tablet Take 1 tablet (40 mg total) by mouth daily.   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   BREAST LUMPECTOMY WITH RADIOACTIVE SEED AND SENTINEL LYMPH NODE BIOPSY Right 05/04/2020   Procedure: RIGHT BREAST LUMPECTOMY WITH BRACKETED RADIOACTIVE SEED AND SENTINEL LYMPH NODE BIOPSY;  Surgeon: Jovita Kussmaul, MD;  Location: [facility];  Service: General;  Laterality: Right;   CESAREAN SECTION     COLONOSCOPY WITH PROPOFOL N/A 09/11/2015   Procedure: COLONOSCOPY WITH PROPOFOL;  Surgeon: Lucilla Lame, MD;  Location: [facility];  Service: Endoscopy;  Laterality: N/A;   IR IMAGING GUIDED PORT INSERTION  07/20/2019    Medical History: Past Medical History:  Diagnosis Date   Asthma    Cancer ([place]) 03/2020   right breast IMC   COPD exacerbation ([place]) 04/12/2016   Diabetes mellitus without complication (HCC)    Hyperlipemia    Hypertension    Personal history of chemotherapy 2021    Family History: Family History  Problem Relation Age of Onset   Diabetes Mother    Hypertension Mother    Hypertension Father    Diabetes Father    Breast  cancer Neg Hx     Social History   Socioeconomic History   Marital status: Married    Spouse name: Not on file   Number of children: Not on file   Years of education: Not on file   Highest education level: Not on file  Occupational History   Not on file  Tobacco Use   Smoking status: Former    Packs/day: 1.00    Years: 2.00    Total pack years: 2.00    Types: Cigarettes    Quit date: 02/29/2020    Years since quitting: 2.3   Smokeless tobacco: Never  Substance and Sexual Activity   Alcohol use: Not Currently    Comment: social   Drug use: No   Sexual activity: Not Currently    Birth control/protection: Post-menopausal  Other Topics Concern   Not on file  Social History Narrative   Not on file   Social Determinants of Health   Financial Resource Strain: Low Risk  (10/24/2020)   Overall Financial Resource Strain (CARDIA)    Difficulty of Paying Living Expenses: Not very hard  Food Insecurity: Not on file  Transportation Needs: Not on file  Physical Activity: Not on file  Stress: Not on file  Social Connections: Not on file  Intimate Partner Violence: Not on file      Review of Systems  Constitutional:  Negative for chills, fatigue and unexpected weight change.  HENT:  Negative for congestion, rhinorrhea, sneezing and sore throat.   Eyes:  Negative for redness.  Respiratory:  Negative for cough, chest tightness and shortness of breath.   Cardiovascular:  Negative for chest pain and palpitations.  Gastrointestinal:  Negative for abdominal pain, constipation, diarrhea, nausea and vomiting.  Genitourinary:  Negative for dysuria and frequency.  Musculoskeletal:  Negative for back pain and neck pain.  Skin:  Negative for rash.  Neurological: Negative.  Negative for tremors and numbness.  Hematological:  Negative for adenopathy. Does not bruise/bleed easily.  Psychiatric/Behavioral:  Negative for behavioral problems (Depression), sleep disturbance and suicidal ideas.  The patient is not nervous/anxious.     Vital Signs: BP 113/63   Pulse 90   Temp 97.7 F (36.5 C)   Resp 16   Ht 5' 1"$  (1.549 m)   Wt 149 lb 6.4 oz (67.8 kg)   SpO2 99%   BMI 28.23 kg/m    Physical Exam Vitals reviewed.  Constitutional:      Appearance: Normal appearance.  HENT:     Head: Normocephalic and atraumatic.  Eyes:     Pupils: Pupils are equal, round, and reactive to light.  Cardiovascular:     Rate and Rhythm: Normal rate and regular rhythm.  Pulmonary:     Effort: Pulmonary effort is normal. No respiratory distress.  Neurological:     Mental Status: She is alert and oriented to person, place, and time.  Psychiatric:        Mood and Affect: Mood normal.        Behavior: Behavior normal.        Assessment/Plan: 1. Type 2 diabetes mellitus with diabetic neuropathy, with long-term current use of insulin (HCC) Stable, continue ozempic and levemir as prescribed. Follow up in 3 months to repeat A1c.  - POCT glycosylated hemoglobin (Hb A1C)  2. Essential hypertension Stable, continue bisoprolol, amlodipine and lisinopril as prescribed for hypertension.  - bisoprolol (ZEBETA) 5 MG tablet; Take 1 tablet (5 mg total) by mouth daily.  Dispense: 90 tablet; Refill: 3 - amLODipine (NORVASC) 10 MG tablet; Take 1 tablet (10 mg total) by mouth daily.  Dispense: 90 tablet; Refill: 1 - lisinopril (ZESTRIL) 40 MG tablet; Take 1 tablet (40 mg total) by mouth daily.  Dispense: 90 tablet; Refill: 3  3. Acute idiopathic gout of left foot Continue allopurinol as prescribed for prevention of gout attacks and colchicine for gout attack as needed.  - allopurinol (ZYLOPRIM) 100 MG tablet; Take 1 tablet (100 mg total) by mouth at bedtime. Take 1 tablet by mouth at night for Gout  Dispense: 90 tablet; Refill: 3 - colchicine 0.6 MG tablet; TAKE FOR A GOUT FLARE/ATTACK:  TAKE 2 TABLETS BY MOUTH THEN  TAKE 1 MORE TABLET 1 HOUR LATER  ON DAY 1, THEN TAKE 1 TABLET BY  MOUTH DAILY UNTIL IT  RESOLVES  Dispense: 100 tablet; Refill: 2  4. Primary osteoarthritis involving multiple joints Continue celebrex as prescribed. Take cyclobenzaprine and hydrocodone prn as prescribed.  - celecoxib (CELEBREX) 200 MG capsule; Take one tab po qd for one week and then as needed  Dispense: 90 capsule; Refill: 1 - cyclobenzaprine (FLEXERIL) 10 MG tablet; Take 1 tablet (10 mg total) by mouth at bedtime as needed for muscle spasms.  Dispense: 15 tablet; Refill: 0 - HYDROcodone-acetaminophen (NORCO/VICODIN) 5-325 MG tablet; Take 1 tablet by mouth every 6 (six) hours as needed for moderate pain or severe pain.  Dispense: 20 tablet; Refill: 0   General Counseling: Geraldy verbalizes understanding of the findings of todays visit and agrees with plan of treatment. I have discussed any further diagnostic evaluation that may be needed or ordered today. We also reviewed her medications today. she has been encouraged to call the office with any questions or concerns that should arise related to todays visit.    Orders Placed This Encounter  Procedures   POCT glycosylated hemoglobin (Hb A1C)    Meds ordered this encounter  Medications   allopurinol (ZYLOPRIM) 100 MG tablet    Sig: Take 1 tablet (100 mg total) by mouth at bedtime. Take 1 tablet by mouth at night for Gout    Dispense:  90 tablet    Refill:  3   colchicine 0.6 MG tablet    Sig: TAKE FOR A GOUT FLARE/ATTACK:  TAKE 2 TABLETS BY MOUTH THEN  TAKE 1 MORE TABLET 1 HOUR LATER  ON DAY 1, THEN TAKE 1 TABLET BY  MOUTH DAILY UNTIL IT RESOLVES    Dispense:  100 tablet    Refill:  2    Please send a replace/new response with 100-Day Supply if appropriate to maximize member benefit. Requesting 1 year supply.   celecoxib (CELEBREX) 200 MG capsule    Sig: Take one tab po qd for one week and then as needed    Dispense:  90 capsule    Refill:  1   bisoprolol (ZEBETA) 5 MG tablet    Sig: Take 1 tablet (5 mg total) by mouth daily.    Dispense:  90 tablet     Refill:  3   amLODipine (NORVASC) 10 MG tablet    Sig: Take 1 tablet (10 mg total) by mouth daily.    Dispense:  90 tablet    Refill:  1    Please note change in dose, patient is taking 1 whole tablet now instead of a half tablet. Please discontinue all previous amlodipine orders   cyclobenzaprine (FLEXERIL) 10 MG tablet    Sig: Take 1 tablet (10 mg total) by mouth at bedtime as needed for muscle spasms.    Dispense:  15 tablet    Refill:  0   HYDROcodone-acetaminophen (NORCO/VICODIN) 5-325 MG tablet    Sig: Take 1 tablet by mouth every 6 (six) hours as needed for moderate pain or severe pain.    Dispense:  20 tablet    Refill:  0   lisinopril (ZESTRIL) 40 MG tablet    Sig: Take 1 tablet (40 mg total) by mouth daily.    Dispense:  90 tablet    Refill:  3    Return in about 3 months (around 10/15/2022) for F/U, Recheck A1C, [place] PCP.   Total time spent:30 Minutes Time spent includes review of chart, medications, test results, and follow up plan with the patient.   [place] Controlled Substance Database was reviewed by me.  This patient was seen by [name], FNP-C in collaboration with Dr. Clayborn Bigness as a part of collaborative care agreement.   Jyron Turman R. Valetta Fuller, MSN, FNP-C Internal medicine

## 2022-07-11 NOTE — Telephone Encounter (Signed)
Left message to call back  

## 2022-07-18 NOTE — Progress Notes (Deleted)
Referring Physician:  Delman Kitten, MD [address] [place],  [place] [address]  Primary Physician:  [name], NP  History of Present Illness: 07/18/2022*** Ms. Dawn Alvarez has a history of HTN, COPD, DM, chemotherapy induced neuropathy, breast CA, hyperlipidemia.   She was seen in ED on 07/05/22 when she felt light headed and had a syncopal episode.   CT in ED showed small chip fracture of C4. This was reviewed with Dr. Lacinda Axon- he advised it was not structural. She was placed in a cervical collar and is here for follow up.       Duration: *** Location: *** Quality: *** Severity: ***  Precipitating: aggravated by *** Modifying factors: made better by *** Weakness: none Timing: *** Bowel/Bladder Dysfunction: none  Conservative measures:  Physical therapy: ***  Multimodal medical therapy including regular antiinflammatories: celebrex, flexeril, neurontin, norco 5  Injections: *** epidural steroid injections  Past Surgery: ***  JOCELYNN PANO has ***no symptoms of cervical myelopathy.  The symptoms are causing a significant impact on the patient's life.   Review of Systems:  A 10 point review of systems is negative, except for the pertinent positives and negatives detailed in the HPI.  Past Medical History: Past Medical History:  Diagnosis Date   Asthma    Cancer ([facility]) 03/2020   right breast IMC   COPD exacerbation ([facility]) 04/12/2016   Diabetes mellitus without complication (HCC)    Hyperlipemia    Hypertension    Personal history of chemotherapy 2021    Past Surgical History: Past Surgical History:  Procedure Laterality Date   BREAST BIOPSY Right 07/05/2019   [place] bx venus marker, grade 3 invasive mammary carcinoma   BREAST BIOPSY Right 07/05/2019   LN bx, hydromarker,PREDOMINANTLY BLOOD AND FIBROADIPOSE TISSUE, WITH SCANT LYMPHOID TISSUE PRESENT       BREAST LUMPECTOMY Right 05/04/2020   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  with rad and chemo   BREAST LUMPECTOMY WITH RADIOACTIVE SEED AND SENTINEL LYMPH NODE BIOPSY Right 05/04/2020   Procedure: RIGHT BREAST LUMPECTOMY WITH BRACKETED RADIOACTIVE SEED AND SENTINEL LYMPH NODE BIOPSY;  Surgeon: Jovita Kussmaul, MD;  Location: [facility];  Service: General;  Laterality: Right;   CESAREAN SECTION     COLONOSCOPY WITH PROPOFOL N/A 09/11/2015   Procedure: COLONOSCOPY WITH PROPOFOL;  Surgeon: Lucilla Lame, MD;  Location: [facility];  Service: Endoscopy;  Laterality: N/A;   IR IMAGING GUIDED PORT INSERTION  07/20/2019    Allergies: Allergies as of 07/24/2022   (No Known Allergies)    Medications: Outpatient Encounter Medications as of 07/24/2022  Medication Sig   Alcohol Swabs (B-D SINGLE USE SWABS REGULAR) PADS Use as directed twice a day   allopurinol (ZYLOPRIM) 100 MG tablet Take 1 tablet (100 mg total) by mouth at bedtime. Take 1 tablet by mouth at night for Gout   amLODipine (NORVASC) 10 MG tablet Take 1 tablet (10 mg total) by mouth daily.   atorvastatin (LIPITOR) 20 MG tablet Take 1 tablet (20 mg total) by mouth daily.   bisoprolol (ZEBETA) 5 MG tablet Take 1 tablet (5 mg total) by mouth daily.   Blood Glucose Monitoring Suppl (TRUE METRIX AIR GLUCOSE METER) w/Device KIT 1 Device by Does not apply route in the morning and at bedtime. Use ad directed e11.65   brimonidine (ALPHAGAN) 0.2 % ophthalmic solution INSTILL 1 DROP INTO BOTH EYES  TWICE DAILY   Calcium Carb-Cholecalciferol (CALCIUM 600 + D) 600-200 MG-UNIT TABS Take 1  tablet by mouth daily.   celecoxib (CELEBREX) 200 MG capsule Take one tab po qd for one week and then as needed   colchicine 0.6 MG tablet TAKE FOR A GOUT FLARE/ATTACK:  TAKE 2 TABLETS BY MOUTH THEN  TAKE 1 MORE TABLET 1 HOUR LATER  ON DAY 1, THEN TAKE 1 TABLET BY  MOUTH DAILY UNTIL IT RESOLVES   cyclobenzaprine (FLEXERIL) 10 MG tablet Take 1 tablet (10 mg total) by mouth at bedtime as needed for muscle spasms.   gabapentin  (NEURONTIN) 300 MG capsule Take 1 capsule by mouth in the morning, 1 capsule by mouth at lunch/midday, and 2 capsule by mouth at bedtime.   glucose blood (TRUE METRIX BLOOD GLUCOSE TEST) test strip Use as instructed twice a daily diag e11.65   HYDROcodone-acetaminophen (NORCO/VICODIN) 5-325 MG tablet Take 1 tablet by mouth every 6 (six) hours as needed for moderate pain or severe pain.   insulin detemir (LEVEMIR FLEXTOUCH) 100 UNIT/ML FlexPen Inject 30 Units into the skin daily. ALONG WITH NEEDLES   Insulin Pen Needle (PEN NEEDLES) 31G X 8 MM MISC Use as directed with insulin E11.65   Lancets Misc. (ACCU-CHEK MULTICLIX LANCET DEV) KIT by Does not apply route. Use as directed twice a day diag E11.65   lidocaine-prilocaine (EMLA) cream SMARTSIG:1 Topical Every Night   lisinopril (ZESTRIL) 40 MG tablet Take 1 tablet (40 mg total) by mouth daily.   nicotine polacrilex ([employer] NICOTINE) 2 MG gum Take 1 each (2 mg total) by mouth as needed for smoking cessation.   Omega-3 Fatty Acids (FISH OIL) 1000 MG CAPS Take 1,000 mg by mouth daily.    Semaglutide, 1 MG/DOSE, (OZEMPIC, 1 MG/DOSE,) 2 MG/1.5ML SOPN Inject 1 mg into the skin once a week. FOR DM   SIMBRINZA 1-0.2 % SUSP    TRUEplus Lancets 30G MISC Use as directed twice daily diag e11.65   Facility-Administered Encounter Medications as of 07/24/2022  Medication   heparin lock flush 100 UNIT/ML injection   sodium chloride flush (NS) 0.9 % injection 10 mL    Social History: Social History   Tobacco Use   Smoking status: Former    Packs/day: 1.00    Years: 2.00    Total pack years: 2.00    Types: Cigarettes    Quit date: 02/29/2020    Years since quitting: 2.3   Smokeless tobacco: Never  Substance Use Topics   Alcohol use: Not Currently    Comment: social   Drug use: No    Family Medical History: Family History  Problem Relation Age of Onset   Diabetes Mother    Hypertension Mother    Hypertension Father    Diabetes Father    Breast  cancer Neg Hx     Physical Examination: There were no vitals filed for this visit.  General: Patient is well developed, well nourished, calm, collected, and in no apparent distress. Attention to examination is appropriate.  Respiratory: Patient is breathing without any difficulty.   NEUROLOGICAL:     Awake, alert, oriented to person, place, and time.  Speech is clear and fluent. Fund of knowledge is appropriate.   Cranial Nerves: Pupils equal round and reactive to light.  Facial tone is symmetric.    She is in a cervical collar.  *** posterior cervical tenderness.  ROM of cervical spine not tested.   No abnormal lesions on exposed skin.   Strength: Side Biceps Triceps Deltoid Interossei Grip Wrist Ext. Wrist Flex.  R 5 5 5  $5 5 5 5  T$ L 5 5 5 5 5 5 5   $ Side Iliopsoas Quads Hamstring PF DF EHL  R 5 5 5 5 5 5  $ L 5 5 5 5 5 5   $ Reflexes are ***2+ and symmetric at the biceps, triceps, brachioradialis, patella and achilles.   Hoffman's is absent.  Clonus is not present.   Bilateral upper and lower extremity sensation is intact to light touch.     Gait is normal.   ***No difficulty with tandem gait.    Medical Decision Making  Imaging: Xrays of cervical spine dated:  ***  CT cervical spine and head dated 07/05/22:  FINDINGS: CT HEAD FINDINGS   Brain: No evidence of acute infarction, hemorrhage, hydrocephalus, extra-axial collection or mass lesion/mass effect. There is a extensive periventricular hypodense signal abnormality, favored to represent sequela of chronic microvascular ischemic change.   Vascular: No disproportionately hyperdense vessel or unexpected calcification.   Skull: Normal. Negative for fracture or focal lesion.   Sinuses/Orbits: No acute finding.   Other: None.   CT CERVICAL SPINE FINDINGS   Alignment: Normal.   Skull base and vertebrae: There is a mildly displaced fracture of the superior articular process C4 on the right (series 5, image  19).   Soft tissues and spinal canal: No prevertebral fluid or swelling. No visible canal hematoma.   Disc levels:  No evidence of high-grade spinal canal stenosis.   Upper chest: Not well-visualized.   Other: Multinodular thyroid goiter.  Atherosclerotic calcifications.   IMPRESSION: 1. No CT evidence of intracranial injury. 2. Age indeterminate mildly displaced fracture of the superior articular process of C4 on the right.     Electronically Signed   By: Marin Roberts M.D.   On: 07/05/2022 12:08    I have personally reviewed the images and agree with the above interpretation.  Assessment and Plan: Ms. Schepis is a pleasant 67 y.o. female has ***  Treatment options discussed with patient and following plan made:   - Order for physical therapy for *** spine ***. Patient to call to schedule appointment. *** - Continue current medications including ***. Reviewed dosing and side effects.  - Prescription for ***. Reviewed dosing and side effects. Take with food.  - Prescription for *** to take prn muscle spasms. Reviewed dosing and side effects. Discussed this can cause drowsiness.  - MRI of *** to further evaluate *** radiculopathy. No improvement time or medications (***).  - Referral to [facility] at [facility] to discuss possible *** injections.  - Will schedule phone visit to review MRI results once I get them back.   I spent a total of *** minutes in face-to-face and non-face-to-face activities related to this patient's care today including review of outside records, review of imaging, review of symptoms, physical exam, discussion of differential diagnosis, discussion of treatment options, and documentation.   Thank you for involving me in the care of this patient.   Geronimo Boot PA-C Dept. of Neurosurgery

## 2022-07-18 NOTE — Telephone Encounter (Signed)
Patient confirmed appt for 07/24/22 with xrays

## 2022-07-19 ENCOUNTER — Encounter: Payer: Self-pay | Admitting: Nurse Practitioner

## 2022-07-21 ENCOUNTER — Other Ambulatory Visit: Payer: Self-pay

## 2022-07-21 DIAGNOSIS — E1165 Type 2 diabetes mellitus with hyperglycemia: Secondary | ICD-10-CM

## 2022-07-21 MED ORDER — LEVEMIR FLEXTOUCH 100 UNIT/ML ~~LOC~~ SOPN: 30.0000 [IU] | PEN_INJECTOR | Freq: Every day | SUBCUTANEOUS | 1 refills | Status: DC

## 2022-07-21 MED ORDER — OZEMPIC (1 MG/DOSE) 2 MG/1.5ML ~~LOC~~ SOPN: 1.0000 mg | PEN_INJECTOR | SUBCUTANEOUS | 6 refills | Status: DC

## 2022-07-23 ENCOUNTER — Other Ambulatory Visit: Payer: Self-pay

## 2022-07-23 DIAGNOSIS — S12301A Unspecified nondisplaced fracture of fourth cervical vertebra, initial encounter for closed fracture: Secondary | ICD-10-CM

## 2022-07-24 ENCOUNTER — Ambulatory Visit: Payer: 59 | Admitting: Orthopedic Surgery

## 2022-07-24 DIAGNOSIS — H40003 Preglaucoma, unspecified, bilateral: Secondary | ICD-10-CM | POA: Diagnosis not present

## 2022-07-29 DIAGNOSIS — H2513 Age-related nuclear cataract, bilateral: Secondary | ICD-10-CM | POA: Diagnosis not present

## 2022-09-09 ENCOUNTER — Inpatient Hospital Stay: Payer: 59 | Attending: Oncology

## 2022-09-09 DIAGNOSIS — Z95828 Presence of other vascular implants and grafts: Secondary | ICD-10-CM

## 2022-09-09 DIAGNOSIS — Z452 Encounter for adjustment and management of vascular access device: Secondary | ICD-10-CM | POA: Insufficient documentation

## 2022-09-09 DIAGNOSIS — Z853 Personal history of malignant neoplasm of breast: Secondary | ICD-10-CM | POA: Diagnosis not present

## 2022-09-09 MED ORDER — HEPARIN SOD (PORK) LOCK FLUSH 100 UNIT/ML IV SOLN
500.0000 [IU] | Freq: Once | INTRAVENOUS | Status: AC
  Administered 2022-09-09: 500 [IU] via INTRAVENOUS
  Filled 2022-09-09: qty 5

## 2022-09-09 MED ORDER — SODIUM CHLORIDE 0.9% FLUSH
10.0000 mL | INTRAVENOUS | Status: DC | PRN
  Administered 2022-09-09: 10 mL via INTRAVENOUS
  Filled 2022-09-09: qty 10

## 2022-09-11 ENCOUNTER — Ambulatory Visit: Payer: 59 | Attending: Radiation Oncology | Admitting: Radiation Oncology

## 2022-09-17 ENCOUNTER — Other Ambulatory Visit: Payer: Self-pay | Admitting: Nurse Practitioner

## 2022-09-17 DIAGNOSIS — M159 Polyosteoarthritis, unspecified: Secondary | ICD-10-CM

## 2022-09-25 ENCOUNTER — Ambulatory Visit (INDEPENDENT_AMBULATORY_CARE_PROVIDER_SITE_OTHER): Payer: 59 | Admitting: Nurse Practitioner

## 2022-09-25 ENCOUNTER — Encounter: Payer: Self-pay | Admitting: Nurse Practitioner

## 2022-09-25 VITALS — BP 140/74 | HR 90 | Temp 98.1°F | Resp 16 | Ht 61.0 in | Wt 156.2 lb

## 2022-09-25 DIAGNOSIS — H4010X0 Unspecified open-angle glaucoma, stage unspecified: Secondary | ICD-10-CM

## 2022-09-25 DIAGNOSIS — H538 Other visual disturbances: Secondary | ICD-10-CM

## 2022-09-25 DIAGNOSIS — Z760 Encounter for issue of repeat prescription: Secondary | ICD-10-CM

## 2022-09-25 MED ORDER — BRIMONIDINE TARTRATE 0.2 % OP SOLN: OPHTHALMIC | 5 refills | Status: DC

## 2022-09-25 NOTE — Progress Notes (Signed)
[facility] [address] [place], [place] [address]  Internal MEDICINE  Office Visit Note  Patient Name: Dawn Alvarez  [id number]  [id number]  Date of Service: 09/25/2022  Chief Complaint  Patient presents with   Acute Visit    Blurry vision. Started Monday.      HPI Dawn Alvarez presents for an acute sick visit for  Blurred vision Comes and goes  Denies any floaters or spots  Wears bifocals not sure if they cause her eyes to be blurry Denies having high blood surgar or blood pressure when they are blurry. Has glaucoma, ran out of eye drops Needs to be seen early by eye doctor.    Current Medication:  Outpatient Encounter Medications as of 09/25/2022  Medication Sig   Alcohol Swabs (B-D SINGLE USE SWABS REGULAR) PADS Use as directed twice a day   allopurinol (ZYLOPRIM) 100 MG tablet Take 1 tablet (100 mg total) by mouth at bedtime. Take 1 tablet by mouth at night for Gout   amLODipine (NORVASC) 10 MG tablet Take 1 tablet (10 mg total) by mouth daily.   atorvastatin (LIPITOR) 20 MG tablet Take 1 tablet (20 mg total) by mouth daily.   bisoprolol (ZEBETA) 5 MG tablet Take 1 tablet (5 mg total) by mouth daily.   Blood Glucose Monitoring Suppl (TRUE METRIX AIR GLUCOSE METER) w/Device KIT 1 Device by Does not apply route in the morning and at bedtime. Use ad directed e11.65   Calcium Carb-Cholecalciferol (CALCIUM 600 + D) 600-200 MG-UNIT TABS Take 1 tablet by mouth daily.   celecoxib (CELEBREX) 200 MG capsule TAKE 1 CAPSULE BY MOUTH DAILY  FOR 1 WEEK AND THEN AS NEEDED   colchicine 0.6 MG tablet TAKE FOR A GOUT FLARE/ATTACK:  TAKE 2 TABLETS BY MOUTH THEN  TAKE 1 MORE TABLET 1 HOUR LATER  ON DAY 1, THEN TAKE 1 TABLET BY  MOUTH DAILY UNTIL IT RESOLVES   cyclobenzaprine (FLEXERIL) 10 MG tablet Take 1 tablet (10 mg total) by mouth at bedtime as needed for muscle spasms.   gabapentin (NEURONTIN) 300 MG capsule Take 1 capsule by mouth in the morning, 1 capsule by mouth at  lunch/midday, and 2 capsule by mouth at bedtime.   glucose blood (TRUE METRIX BLOOD GLUCOSE TEST) test strip Use as instructed twice a daily diag e11.65   HYDROcodone-acetaminophen (NORCO/VICODIN) 5-325 MG tablet Take 1 tablet by mouth every 6 (six) hours as needed for moderate pain or severe pain.   insulin detemir (LEVEMIR FLEXTOUCH) 100 UNIT/ML FlexPen Inject 30 Units into the skin daily. ALONG WITH NEEDLES   Insulin Pen Needle (PEN NEEDLES) 31G X 8 MM MISC Use as directed with insulin E11.65   Lancets Misc. (ACCU-CHEK MULTICLIX LANCET DEV) KIT by Does not apply route. Use as directed twice a day diag E11.65   lidocaine-prilocaine (EMLA) cream SMARTSIG:1 Topical Every Night   lisinopril (ZESTRIL) 40 MG tablet Take 1 tablet (40 mg total) by mouth daily.   nicotine polacrilex ([employer]S NICOTINE) 2 MG gum Take 1 each (2 mg total) by mouth as needed for smoking cessation.   Omega-3 Fatty Acids (FISH OIL) 1000 MG CAPS Take 1,000 mg by mouth daily.    Semaglutide, 1 MG/DOSE, (OZEMPIC, 1 MG/DOSE,) 2 MG/1.5ML SOPN Inject 1 mg into the skin once a week. FOR DM   SIMBRINZA 1-0.2 % SUSP    TRUEplus Lancets 30G MISC Use as directed twice daily diag e11.65   [DISCONTINUED] brimonidine (ALPHAGAN) 0.2 % ophthalmic solution INSTILL 1  DROP INTO BOTH EYES  TWICE DAILY   brimonidine (ALPHAGAN) 0.2 % ophthalmic solution INSTILL 1 DROP INTO BOTH EYES  TWICE DAILY   Facility-Administered Encounter Medications as of 09/25/2022  Medication   heparin lock flush 100 UNIT/ML injection   sodium chloride flush (NS) 0.9 % injection 10 mL      Medical History: Past Medical History:  Diagnosis Date   Asthma    Cancer 03/2020   right breast [facility]   COPD exacerbation 04/12/2016   Diabetes mellitus without complication    Hyperlipemia    Hypertension    Personal history of chemotherapy 2021     Vital Signs: BP (!) 140/74   Pulse 90   Temp 98.1 F (36.7 C)   Resp 16   Ht 5\' 1"  (1.549 m)   Wt 156 lb 3.2 oz (70.9  kg)   SpO2 95%   BMI 29.51 kg/m    Review of Systems  Constitutional:  Negative for chills, fatigue and unexpected weight change.  HENT:  Negative for congestion, rhinorrhea, sneezing and sore throat.   Eyes:  Positive for visual disturbance. Negative for redness.  Respiratory:  Negative for cough, chest tightness and shortness of breath.   Cardiovascular:  Negative for chest pain and palpitations.  Gastrointestinal:  Negative for abdominal pain, constipation, diarrhea, nausea and vomiting.  Genitourinary:  Negative for dysuria and frequency.  Musculoskeletal:  Negative for back pain and neck pain.  Skin:  Negative for rash.  Neurological: Negative.  Negative for tremors and numbness.  Hematological:  Negative for adenopathy. Does not bruise/bleed easily.  Psychiatric/Behavioral:  Negative for behavioral problems (Depression), sleep disturbance and suicidal ideas. The patient is not nervous/anxious.     Physical Exam Vitals reviewed.  Constitutional:      Appearance: Normal appearance.  HENT:     Head: Normocephalic and atraumatic.  Eyes:     Pupils: Pupils are equal, round, and reactive to light.  Cardiovascular:     Rate and Rhythm: Normal rate and regular rhythm.  Pulmonary:     Effort: Pulmonary effort is normal. No respiratory distress.  Neurological:     Mental Status: She is alert and oriented to person, place, and time.  Psychiatric:        Mood and Affect: Mood normal.        Behavior: Behavior normal.       Assessment/Plan: 1. Blurry vision, bilateral Patient is going to call her ophthalmologist and get an earlier appointment to discuss her blurry vision.   2. Open-angle glaucoma, unspecified glaucoma stage, unspecified laterality, unspecified open-angle glaucoma type Refills ordered. Patient had run out of her medication for her glaucoma.  - brimonidine (ALPHAGAN) 0.2 % ophthalmic solution; INSTILL 1 DROP INTO BOTH EYES  TWICE DAILY  Dispense: 30 mL;  Refill: 5   General Counseling: Dawn Alvarez verbalizes understanding of the findings of todays visit and agrees with plan of treatment. I have discussed any further diagnostic evaluation that may be needed or ordered today. We also reviewed her medications today. she has been encouraged to call the office with any questions or concerns that should arise related to todays visit.    Counseling:    No orders of the defined types were placed in this encounter.   Meds ordered this encounter  Medications   brimonidine (ALPHAGAN) 0.2 % ophthalmic solution    Sig: INSTILL 1 DROP INTO BOTH EYES  TWICE DAILY    Dispense:  30 mL    Refill:  5  Please send a replace/new response with 100-Day Supply if appropriate to maximize member benefit. Requesting 1 year supply.    Return if symptoms worsen or fail to improve.  [place] Controlled Substance Database was reviewed by me for overdose risk score (ORS)  Time spent:20 Minutes Time spent with patient included reviewing progress notes, labs, imaging studies, and discussing plan for follow up.   This patient was seen by Sallyanne Kuster, FNP-C in collaboration with Dr. Beverely Risen as a part of collaborative care agreement.  Drevin Ortner R. Tedd Sias, MSN, FNP-C Internal Medicine

## 2022-10-06 ENCOUNTER — Ambulatory Visit: Payer: 59 | Admitting: Nurse Practitioner

## 2022-10-08 ENCOUNTER — Encounter: Payer: Self-pay | Admitting: Pharmacist

## 2022-10-08 NOTE — Progress Notes (Signed)
[facility] Quality Pharmacy Team Statin Quality Measure Assessment   10/08/2022  Dawn Alvarez [birth year] [id number]  Per review of chart and payor information, [name] has a diagnosis of diabetes but is not currently filling a statin prescription.  This places patient into the Statin Use In Patients with Diabetes (SUPD) measure for CMS.    Atorvastatin is listed as an active medication but the patient has not filled this since October 2023.  If clinically appropriate, please discuss statin compliance at tomorrow's office visit or if she has experienced an intolerance, please consider adding the corresponding CPT code.  Her calculated 10-year ASCVD risk score (Arnett DK, et al., 2019) is: 46.3%  Code for past statin intolerance or  other exclusions (required annually)  Provider Requirements: Associate code during an office visit or telehealth encounter  Drug Induced Myopathy G72.0   Myopathy, unspecified G72.9   Myositis, unspecified M60.9   Rhabdomyolysis M62.82   Cirrhosis of liver K74.69   Prediabetes R73.03   PCOS E28.2   Thank you for allowing [facility] pharmacy to be a part of this patient's care. Dellie Burns, PharmD [facility]  [facility] Clinical Pharmacist Office: [phone]

## 2022-10-09 ENCOUNTER — Ambulatory Visit (INDEPENDENT_AMBULATORY_CARE_PROVIDER_SITE_OTHER): Payer: 59 | Admitting: Nurse Practitioner

## 2022-10-09 ENCOUNTER — Encounter: Payer: Self-pay | Admitting: Nurse Practitioner

## 2022-10-09 ENCOUNTER — Ambulatory Visit: Payer: 59 | Admitting: Nurse Practitioner

## 2022-10-09 VITALS — BP 136/80 | HR 64 | Temp 97.3°F | Resp 16 | Ht 61.0 in | Wt 156.6 lb

## 2022-10-09 DIAGNOSIS — E782 Mixed hyperlipidemia: Secondary | ICD-10-CM | POA: Diagnosis not present

## 2022-10-09 DIAGNOSIS — I10 Essential (primary) hypertension: Secondary | ICD-10-CM

## 2022-10-09 DIAGNOSIS — Z794 Long term (current) use of insulin: Secondary | ICD-10-CM | POA: Diagnosis not present

## 2022-10-09 DIAGNOSIS — Z23 Encounter for immunization: Secondary | ICD-10-CM

## 2022-10-09 DIAGNOSIS — E1140 Type 2 diabetes mellitus with diabetic neuropathy, unspecified: Secondary | ICD-10-CM | POA: Diagnosis not present

## 2022-10-09 DIAGNOSIS — Z0001 Encounter for general adult medical examination with abnormal findings: Secondary | ICD-10-CM | POA: Diagnosis not present

## 2022-10-09 DIAGNOSIS — K5909 Other constipation: Secondary | ICD-10-CM

## 2022-10-09 LAB — POCT GLYCOSYLATED HEMOGLOBIN (HGB A1C): Hemoglobin A1C: 7.4 % — AB (ref 4.0–5.6)

## 2022-10-09 MED ORDER — PEN NEEDLES 31G X 8 MM MISC: 1 refills | Status: DC

## 2022-10-09 MED ORDER — TETANUS-DIPHTH-ACELL PERTUSSIS 5-2.5-18.5 LF-MCG/0.5 IM SUSP
0.5000 mL | Freq: Once | INTRAMUSCULAR | 0 refills | Status: AC
Start: 2022-10-09 — End: 2022-10-09

## 2022-10-09 MED ORDER — GABAPENTIN 300 MG PO CAPS
ORAL_CAPSULE | ORAL | 5 refills | Status: DC
Start: 2022-10-09 — End: 2023-02-18

## 2022-10-09 MED ORDER — TRUEPLUS LANCETS 30G MISC: 3 refills | Status: DC

## 2022-10-09 NOTE — Progress Notes (Signed)
Dawn Alvarez [address] [place], [place] Al[place] [id number]  Internal MEDICINE  Office Visit Note  Patient Name: Dawn Alvarez  [id number]  [id number]  Date of Service: 10/09/2022  Chief Complaint  Patient presents with   Diabetes   Hypertension   Hyperlipidemia   Medicare Wellness    HPI Dawn Alvarez presents for an annual well visit and physical exam.  Well-appearing 67 y.o. female with diabetes, hypertension, high cholesterol, gout, arthritis, COPD, neuropathy and history of breast cancer Routine CRC screening: dune in 2027 Routine mammogram: due in June  DEXA scan: done in June 2023 Eye exam scheduled for august  foot exam: done today  Labs:  due for routine labs.  Has not had a BM in 3-4 weeks.  Feet are painful and numb -- is on gabapentin  Index finger of right hand is painful as well as the 5th finger of the right hand.    Current Medication: Outpatient Encounter Medications as of 10/09/2022  Medication Sig   Alcohol Swabs (B-D SINGLE USE SWABS REGULAR) PADS Use as directed twice a day   allopurinol (ZYLOPRIM) 100 MG tablet Take 1 tablet (100 mg total) by mouth at bedtime. Take 1 tablet by mouth at night for Gout   amLODipine (NORVASC) 10 MG tablet Take 1 tablet (10 mg total) by mouth daily.   atorvastatin (LIPITOR) 20 MG tablet Take 1 tablet (20 mg total) by mouth daily.   bisoprolol (ZEBETA) 5 MG tablet Take 1 tablet (5 mg total) by mouth daily.   Blood Glucose Monitoring Suppl (TRUE METRIX AIR GLUCOSE METER) w/Device KIT 1 Device by Does not apply route in the morning and at bedtime. Use ad directed e11.65   brimonidine (ALPHAGAN) 0.2 % ophthalmic solution INSTILL 1 DROP INTO BOTH EYES  TWICE DAILY   Calcium Carb-Cholecalciferol (CALCIUM 600 + D) 600-200 MG-UNIT TABS Take 1 tablet by mouth daily.   celecoxib (CELEBREX) 200 MG capsule TAKE 1 CAPSULE BY MOUTH DAILY  FOR 1 WEEK AND THEN AS NEEDED   colchicine 0.6 MG tablet TAKE FOR A GOUT FLARE/ATTACK:  TAKE 2  TABLETS BY MOUTH THEN  TAKE 1 MORE TABLET 1 HOUR LATER  ON DAY 1, THEN TAKE 1 TABLET BY  MOUTH DAILY UNTIL IT RESOLVES   cyclobenzaprine (FLEXERIL) 10 MG tablet Take 1 tablet (10 mg total) by mouth at bedtime as needed for muscle spasms.   glucose blood (TRUE METRIX BLOOD GLUCOSE TEST) test strip Use as instructed twice a daily diag e11.65   HYDROcodone-acetaminophen (NORCO/VICODIN) 5-325 MG tablet Take 1 tablet by mouth every 6 (six) hours as needed for moderate pain or severe pain.   insulin detemir (LEVEMIR FLEXTOUCH) 100 UNIT/ML FlexPen Inject 30 Units into the skin daily. ALONG WITH NEEDLES   lidocaine-prilocaine (EMLA) cream SMARTSIG:1 Topical Every Night   lisinopril (ZESTRIL) 40 MG tablet Take 1 tablet (40 mg total) by mouth daily.   nicotine polacrilex ([employer]S NICOTINE) 2 MG gum Take 1 each (2 mg total) by mouth as needed for smoking cessation.   Omega-3 Fatty Acids (FISH OIL) 1000 MG CAPS Take 1,000 mg by mouth daily.    Semaglutide, 1 MG/DOSE, (OZEMPIC, 1 MG/DOSE,) 2 MG/1.5ML SOPN Inject 1 mg into the skin once a week. FOR DM   SIMBRINZA 1-0.2 % SUSP    [DISCONTINUED] gabapentin (NEURONTIN) 300 MG capsule Take 1 capsule by mouth in the morning, 1 capsule by mouth at lunch/midday, and 2 capsule by mouth at bedtime.   [DISCONTINUED] Insulin Pen Needle (  PEN NEEDLES) 31G X 8 MM MISC Use as directed with insulin E11.65   [DISCONTINUED] Lancets Misc. (ACCU-CHEK MULTICLIX LANCET DEV) KIT by Does not apply route. Use as directed twice a day diag E11.65   [DISCONTINUED] Tdap (BOOSTRIX) 5-2.5-18.5 LF-MCG/0.5 injection Inject 0.5 mLs into the muscle once.   [DISCONTINUED] TRUEplus Lancets 30G MISC Use as directed twice daily diag e11.65   gabapentin (NEURONTIN) 300 MG capsule Take 2 capsule by mouth in the morning, 1 capsule by mouth at lunch/midday, and 3 capsule by mouth at bedtime.   Insulin Pen Needle (PEN NEEDLES) 31G X 8 MM MISC Use as directed with insulin E11.65   Tdap (BOOSTRIX) 5-2.5-18.5  LF-MCG/0.5 injection Inject 0.5 mLs into the muscle once for 1 dose.   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   BREAST LUMPECTOMY WITH RADIOACTIVE SEED AND SENTINEL LYMPH NODE BIOPSY Right 05/04/2020   Procedure: RIGHT BREAST LUMPECTOMY WITH BRACKETED RADIOACTIVE SEED AND SENTINEL LYMPH NODE BIOPSY;  Surgeon: Dawn Miner, MD;  Location: [facility];  Service: General;  Laterality: Right;   CESAREAN SECTION     COLONOSCOPY WITH PROPOFOL N/A 09/11/2015   Procedure: COLONOSCOPY WITH PROPOFOL;  Surgeon: Dawn Minium, MD;  Location: Dawn [facility];  Service: Alvarez;  Laterality: N/A;   IR IMAGING GUIDED PORT INSERTION  07/20/2019    Medical History: Past Medical History:  Diagnosis Date   Asthma    Cancer (Dawn Alvarez) 03/2020   right breast [facility]   COPD exacerbation (Dawn Alvarez) 04/12/2016   Diabetes mellitus without complication (Dawn Alvarez)    Hyperlipemia    Hypertension    Personal history of chemotherapy 2021    Family History: Family History  Problem Relation Age of Onset   Diabetes Mother    Hypertension Mother    Hypertension Father    Diabetes Father    Breast cancer Neg Hx     Social History   Socioeconomic History   Marital status: Married    Spouse name: Not on file   Number of children: Not on file   Years of education: Not on file   Highest education level: Not on  file  Occupational History   Not on file  Tobacco Use   Smoking status: Former    Packs/day: 1.00    Years: 2.00    Additional pack years: 0.00    Total pack years: 2.00    Types: Cigarettes    Quit date: 02/29/2020    Years since quitting: 2.6   Smokeless tobacco: Never  Substance and Sexual Activity   Alcohol use: Not Currently    Comment: social   Drug use: No   Sexual activity: Not Currently    Birth control/protection: Post-menopausal  Other Topics Concern   Not on file  Social History Narrative   Not on file   Social Determinants of Health   Financial Resource Strain: Low  Risk  (10/24/2020)   Overall Financial Resource Strain (CARDIA)    Difficulty of Paying Living Expenses: Not very hard  Food Insecurity: Not on file  Transportation Needs: Not on file  Physical Activity: Not on file  Stress: Not on file  Social Connections: Not on file  Intimate Partner Violence: Not on file      Review of Systems  Constitutional:  Negative for activity change, appetite change, chills, fatigue, fever and unexpected weight change.  HENT: Negative.  Negative for congestion, ear pain, rhinorrhea, sore throat and trouble swallowing.   Eyes: Negative.   Respiratory: Negative.  Negative for cough, chest tightness, shortness of breath and wheezing.   Cardiovascular: Negative.  Negative for chest pain.  Gastrointestinal: Negative.  Negative for abdominal pain, blood in stool, constipation, diarrhea, nausea and vomiting.  Endocrine: Negative.   Genitourinary: Negative.  Negative for difficulty urinating, dysuria, frequency, hematuria and urgency.  Musculoskeletal: Negative.  Negative for arthralgias, back pain, joint swelling, myalgias and neck pain.  Skin: Negative.  Negative for rash and wound.  Allergic/Immunologic: Negative.  Negative for immunocompromised state.  Neurological: Negative.  Negative for dizziness, seizures, numbness and headaches.  Hematological: Negative.    Psychiatric/Behavioral: Negative.  Negative for behavioral problems, self-injury and suicidal ideas. The patient is not nervous/anxious.     Vital Signs: BP 136/80   Pulse 64   Temp (!) 97.3 F (36.3 C)   Resp 16   Ht 5\' 1"  (1.549 m)   Wt 156 lb 9.6 oz (71 kg)   SpO2 95%   BMI 29.59 kg/m    Physical Exam Vitals reviewed.  Constitutional:      General: She is awake. She is not in acute distress.    Appearance: Normal appearance. She is well-developed and well-groomed. She is obese. She is not ill-appearing or diaphoretic.  HENT:     Head: Normocephalic and atraumatic.     Right Ear: Tympanic membrane, ear canal and external ear normal.     Left Ear: Tympanic membrane, ear canal and external ear normal.     Nose: Nose normal. No congestion or rhinorrhea.     Mouth/Throat:     Lips: Pink.     Mouth: Mucous membranes are moist.     Pharynx: Oropharynx is clear. Uvula midline. No oropharyngeal exudate or posterior oropharyngeal erythema.  Eyes:     General: Lids are normal. Vision grossly intact. Gaze aligned appropriately. No scleral icterus.       Right eye: No discharge.        Left eye: No discharge.     Extraocular Movements: Extraocular movements intact.     Conjunctiva/sclera: Conjunctivae normal.     Pupils: Pupils are equal, round, and reactive to light.     Funduscopic exam:    Right eye: Red reflex present.        Left eye: Red reflex present. Neck:     Thyroid: No thyromegaly.     Vascular: No JVD.     Trachea: Trachea and phonation normal. No tracheal deviation.  Cardiovascular:     Rate and Rhythm: Normal rate and regular rhythm.     Pulses:          Dorsalis pedis pulses are 3+ on the right side and 3+ on the left side.       Posterior tibial pulses are 3+ on the right side and 3+ on the left side.     Heart sounds: Normal heart sounds, S1 normal and S2  normal. No murmur heard.    No friction rub. No gallop.  Pulmonary:     Effort: Pulmonary effort is  normal. No accessory muscle usage or respiratory distress.     Breath sounds: Normal breath sounds and air entry. No stridor. No decreased breath sounds, wheezing or rales.  Chest:     Chest wall: No tenderness.     Comments: Declined clinical breast exam, mammogram scheduled in june Abdominal:     General: Bowel sounds are normal. There is no distension.     Palpations: Abdomen is soft. There is no shifting dullness, fluid wave, mass or pulsatile mass.     Tenderness: There is no abdominal tenderness. There is no guarding or rebound.  Musculoskeletal:        General: No tenderness or deformity. Normal range of motion.     Cervical back: Normal range of motion and neck supple.     Right lower leg: No edema.     Left lower leg: No edema.     Right foot: Normal range of motion.     Left foot: Normal range of motion.  Feet:     Right foot:     Protective Sensation: 6 sites tested.  4 sites sensed.     Skin integrity: Callus and dry skin present.     Toenail Condition: Right toenails are abnormally thick, long and ingrown.     Left foot:     Protective Sensation: 6 sites tested.  4 sites sensed.     Skin integrity: Callus and dry skin present.     Toenail Condition: Left toenails are abnormally thick, long and ingrown.  Lymphadenopathy:     Cervical: No cervical adenopathy.  Skin:    General: Skin is warm and dry.     Capillary Refill: Capillary refill takes less than 2 seconds.     Coloration: Skin is not pale.     Findings: No erythema or rash.  Neurological:     Mental Status: She is alert and oriented to person, place, and time.     Cranial Nerves: No cranial nerve deficit.     Motor: No abnormal muscle tone.     Coordination: Coordination normal.     Gait: Gait normal.     Deep Tendon Reflexes: Reflexes are normal and symmetric.  Psychiatric:        Mood and Affect: Mood normal.        Behavior: Behavior normal. Behavior is cooperative.        Thought Content: Thought  content normal.        Judgment: Judgment normal.       Assessment/Plan: 1. Encounter for routine adult health examination with abnormal findings Age-appropriate preventive screenings and vaccinations discussed, annual physical exam completed. Routine labs for health maintenance ordered, see below. PHM updated.  - CBC with Differential/Platelet - CMP14+EGFR - Lipid Profile  2. Type 2 diabetes mellitus with diabetic neuropathy, with long-term current use of insulin (Dawn Alvarez) A1c did increase slightly, routine labs ordered.  Continue current medications as prescribed. Discussed diet and lifestyle modifications.  Repeat A1c in 4 months - POCT glycosylated hemoglobin (Hb A1C) - CBC with Differential/Platelet - CMP14+EGFR - Lipid Profile - gabapentin (NEURONTIN) 300 MG capsule; Take 2 capsule by mouth in the morning, 1 capsule by mouth at lunch/midday, and 3 capsule by mouth at bedtime.  Dispense: 180 capsule; Refill: 5 - TRUEplus Lancets 30G MISC; Use as directed twice daily diag e11.65  Dispense: 100 each; Refill:  3 - Insulin Pen Needle (PEN NEEDLES) 31G X 8 MM MISC; Use as directed with insulin E11.65  Dispense: 300 each; Refill: 1  3. Other constipation Discussed OTC oral medications and OTC enemas that she can try at home. Also gave a sample of linzess. Will call clinic if none of the OTC options are not successful.  4. Essential hypertension Stable, continue current medications   5. Mixed hyperlipidemia Routine labs ordered - CBC with Differential/Platelet - CMP14+EGFR - Lipid Profile  6. Need for vaccination - Tdap (BOOSTRIX) 5-2.5-18.5 LF-MCG/0.5 injection; Inject 0.5 mLs into the muscle once for 1 dose.  Dispense: 0.5 mL; Refill: 0     General Counseling: Bernardina verbalizes understanding of the findings of todays visit and agrees with plan of treatment. I have discussed any further diagnostic evaluation that may be needed or ordered today. We also reviewed her medications  today. she has been encouraged to call the office with any questions or concerns that should arise related to todays visit.    Orders Placed This Encounter  Procedures   CBC with Differential/Platelet   CMP14+EGFR   Lipid Profile   POCT glycosylated hemoglobin (Hb A1C)    Meds ordered this encounter  Medications   Tdap (BOOSTRIX) 5-2.5-18.5 LF-MCG/0.5 injection    Sig: Inject 0.5 mLs into the muscle once for 1 dose.    Dispense:  0.5 mL    Refill:  0   gabapentin (NEURONTIN) 300 MG capsule    Sig: Take 2 capsule by mouth in the morning, 1 capsule by mouth at lunch/midday, and 3 capsule by mouth at bedtime.    Dispense:  180 capsule    Refill:  5    Note change in directions, dose and number of capsule   TRUEplus Lancets 30G MISC    Sig: Use as directed twice daily diag e11.65    Dispense:  100 each    Refill:  3   Insulin Pen Needle (PEN NEEDLES) 31G X 8 MM MISC    Sig: Use as directed with insulin E11.65    Dispense:  300 each    Refill:  1    Return in about 4 months (around 02/09/2023) for F/U, Recheck A1C, Layonna Dobie PCP.   Total time spent:30 Minutes Time spent includes review of chart, medications, test results, and follow up plan with the patient.   [place] Controlled Substance Database was reviewed by me.  This patient was seen by Sallyanne Kuster, FNP-C in collaboration with Dr. Beverely Risen as a part of collaborative care agreement.  Carla Rashad R. Tedd Sias, MSN, FNP-C Internal medicine

## 2022-10-11 ENCOUNTER — Encounter: Payer: Self-pay | Admitting: Nurse Practitioner

## 2022-10-21 ENCOUNTER — Telehealth: Payer: Self-pay | Admitting: Medical Oncology

## 2022-10-21 NOTE — Telephone Encounter (Signed)
Z6109 - A Prospective Observational Cohort Study to Develop a Predictive Model of Taxane-Induced Peripheral Neuropathy in Cancer Patients   Outgoing call: [phone]  Call to patient regarding Week 156, study follow-up. LMOM with patient, introducing myself and informing her that this is the final study call assessment for neuropathy. Asked patient to return call at her earliest convenience.  Patient thanked, call back number provided.   Rexene Edison, RN, BSN, [facility] Clinical Research Nurse Lead 10/21/2022 2:05 PM

## 2022-10-23 ENCOUNTER — Telehealth: Payer: Self-pay | Admitting: Medical Oncology

## 2022-10-23 NOTE — Telephone Encounter (Signed)
[id number] - A Prospective Observational Cohort Study to Develop a Predictive Model of Taxane-Induced Peripheral Neuropathy in Cancer Patients   Outgoing call: 1432 Week 156 assessment   Call to patient regarding this last study assessment. I introduced myself and inquired with patient if she felt comfortable and had 10 minutes to complete study questionnaires over the phone with me. Patient confirmed and gave me her verbal consent to complete the questions over the phone. This nurse read over the questionnaires and patient provided her response. All required study questionnaires have been completed for this time point.    Patient continues to report that she does have "quite a bit" of shooting and burning pain in toes and feet, as well as "a little" numbness, tingling, shooting and burning pain to fingers. Patient continues with gabapentin.    Patient informed that this was her final study assessment and that she has completed her participation in the study. I thanked patient for her time today and for her continued contribution to the study. Patient denies any questions at this time. Patient encouraged to call MD for questions or concerns   Rexene Edison, RN, BSN, [facility] Clinical Research Nurse Lead 10/23/2022 2:49 PM

## 2022-10-28 ENCOUNTER — Inpatient Hospital Stay: Payer: 59 | Attending: Oncology

## 2022-10-28 DIAGNOSIS — E1140 Type 2 diabetes mellitus with diabetic neuropathy, unspecified: Secondary | ICD-10-CM | POA: Diagnosis not present

## 2022-10-28 DIAGNOSIS — C50411 Malignant neoplasm of upper-outer quadrant of right female breast: Secondary | ICD-10-CM | POA: Insufficient documentation

## 2022-10-28 DIAGNOSIS — Z452 Encounter for adjustment and management of vascular access device: Secondary | ICD-10-CM | POA: Insufficient documentation

## 2022-10-28 DIAGNOSIS — Z0001 Encounter for general adult medical examination with abnormal findings: Secondary | ICD-10-CM | POA: Diagnosis not present

## 2022-10-28 DIAGNOSIS — Z794 Long term (current) use of insulin: Secondary | ICD-10-CM | POA: Diagnosis not present

## 2022-10-28 DIAGNOSIS — E782 Mixed hyperlipidemia: Secondary | ICD-10-CM | POA: Diagnosis not present

## 2022-10-28 DIAGNOSIS — Z171 Estrogen receptor negative status [ER-]: Secondary | ICD-10-CM | POA: Diagnosis not present

## 2022-10-28 DIAGNOSIS — Z95828 Presence of other vascular implants and grafts: Secondary | ICD-10-CM

## 2022-10-28 MED ORDER — SODIUM CHLORIDE 0.9% FLUSH
10.0000 mL | Freq: Once | INTRAVENOUS | Status: AC
  Administered 2022-10-28: 10 mL via INTRAVENOUS
  Filled 2022-10-28: qty 10

## 2022-10-28 MED ORDER — HEPARIN SOD (PORK) LOCK FLUSH 100 UNIT/ML IV SOLN
500.0000 [IU] | Freq: Once | INTRAVENOUS | Status: AC
  Administered 2022-10-28: 500 [IU] via INTRAVENOUS
  Filled 2022-10-28: qty 5

## 2022-10-29 LAB — CBC WITH DIFFERENTIAL/PLATELET
Basophils Absolute: 0.1 10*3/uL (ref 0.0–0.2)
Basos: 1 %
EOS (ABSOLUTE): 0.2 10*3/uL (ref 0.0–0.4)
Eos: 2 %
Hematocrit: 38.2 % (ref 34.0–46.6)
Hemoglobin: 12.7 g/dL (ref 11.1–15.9)
Immature Grans (Abs): 0 10*3/uL (ref 0.0–0.1)
Immature Granulocytes: 0 %
Lymphocytes Absolute: 1.5 10*3/uL (ref 0.7–3.1)
Lymphs: 19 %
MCH: 29.1 pg (ref 26.6–33.0)
MCHC: 33.2 g/dL (ref 31.5–35.7)
MCV: 87 fL (ref 79–97)
Monocytes Absolute: 0.5 10*3/uL (ref 0.1–0.9)
Monocytes: 6 %
Neutrophils Absolute: 5.4 10*3/uL (ref 1.4–7.0)
Neutrophils: 72 %
Platelets: 221 10*3/uL (ref 150–450)
RBC: 4.37 x10E6/uL (ref 3.77–5.28)
RDW: 14.2 % (ref 11.7–15.4)
WBC: 7.6 10*3/uL (ref 3.4–10.8)

## 2022-10-29 LAB — CMP14+EGFR
ALT: 16 IU/L (ref 0–32)
AST: 20 IU/L (ref 0–40)
Albumin/Globulin Ratio: 1.5 (ref 1.2–2.2)
Albumin: 4 g/dL (ref 3.9–4.9)
Alkaline Phosphatase: 59 IU/L (ref 44–121)
BUN/Creatinine Ratio: 16 (ref 12–28)
BUN: 15 mg/dL (ref 8–27)
Bilirubin Total: 0.2 mg/dL (ref 0.0–1.2)
CO2: 21 mmol/L (ref 20–29)
Calcium: 9.2 mg/dL (ref 8.7–10.3)
Chloride: 100 mmol/L (ref 96–106)
Creatinine, Ser: 0.91 mg/dL (ref 0.57–1.00)
Globulin, Total: 2.7 g/dL (ref 1.5–4.5)
Glucose: 223 mg/dL — ABNORMAL HIGH (ref 70–99)
Potassium: 4.2 mmol/L (ref 3.5–5.2)
Sodium: 137 mmol/L (ref 134–144)
Total Protein: 6.7 g/dL (ref 6.0–8.5)
eGFR: 70 mL/min/{1.73_m2} (ref >59)

## 2022-10-29 LAB — LIPID PANEL
Chol/HDL Ratio: 4 ratio (ref 0.0–4.4)
Cholesterol, Total: 240 mg/dL — ABNORMAL HIGH (ref 100–199)
HDL: 60 mg/dL (ref >39)
LDL Chol Calc (NIH): 150 mg/dL — ABNORMAL HIGH (ref 0–99)
Triglycerides: 170 mg/dL — ABNORMAL HIGH (ref 0–149)
VLDL Cholesterol Cal: 30 mg/dL (ref 5–40)

## 2022-11-11 ENCOUNTER — Other Ambulatory Visit: Payer: Self-pay | Admitting: Nurse Practitioner

## 2022-11-11 DIAGNOSIS — M159 Polyosteoarthritis, unspecified: Secondary | ICD-10-CM

## 2022-11-11 NOTE — Telephone Encounter (Signed)
Ok to send

## 2022-11-21 ENCOUNTER — Other Ambulatory Visit: Payer: 59

## 2022-12-01 NOTE — Progress Notes (Signed)
CBC and CMP are normal.   Cholesterol panel is abnormal -- LDL at 150. Continue to limit red meat, eat lean proteins. I also recommend increasing the atorvastatin dose if she is agreeable to this let me know and I will change the order.

## 2022-12-03 ENCOUNTER — Other Ambulatory Visit: Payer: Self-pay | Admitting: Nurse Practitioner

## 2022-12-03 DIAGNOSIS — E782 Mixed hyperlipidemia: Secondary | ICD-10-CM

## 2022-12-03 MED ORDER — ATORVASTATIN CALCIUM 40 MG PO TABS
40.0000 mg | ORAL_TABLET | Freq: Every day | ORAL | 3 refills | Status: DC
Start: 2022-12-03 — End: 2023-06-29

## 2022-12-03 NOTE — Progress Notes (Signed)
Patient notified

## 2023-01-05 ENCOUNTER — Inpatient Hospital Stay: Payer: 59

## 2023-01-05 ENCOUNTER — Inpatient Hospital Stay: Payer: 59 | Admitting: Oncology

## 2023-01-19 ENCOUNTER — Other Ambulatory Visit: Payer: Self-pay | Admitting: Oncology

## 2023-01-19 ENCOUNTER — Other Ambulatory Visit: Payer: Self-pay | Admitting: Nurse Practitioner

## 2023-01-19 ENCOUNTER — Inpatient Hospital Stay (HOSPITAL_BASED_OUTPATIENT_CLINIC_OR_DEPARTMENT_OTHER): Payer: 59 | Admitting: Oncology

## 2023-01-19 ENCOUNTER — Encounter: Payer: Self-pay | Admitting: Oncology

## 2023-01-19 ENCOUNTER — Inpatient Hospital Stay: Payer: 59 | Attending: Oncology

## 2023-01-19 VITALS — BP 124/72 | HR 58 | Temp 96.0°F | Resp 18 | Wt 157.4 lb

## 2023-01-19 DIAGNOSIS — C50411 Malignant neoplasm of upper-outer quadrant of right female breast: Secondary | ICD-10-CM

## 2023-01-19 DIAGNOSIS — Z79899 Other long term (current) drug therapy: Secondary | ICD-10-CM | POA: Diagnosis not present

## 2023-01-19 DIAGNOSIS — Z95828 Presence of other vascular implants and grafts: Secondary | ICD-10-CM | POA: Diagnosis not present

## 2023-01-19 DIAGNOSIS — T451X5A Adverse effect of antineoplastic and immunosuppressive drugs, initial encounter: Secondary | ICD-10-CM | POA: Diagnosis not present

## 2023-01-19 DIAGNOSIS — Z171 Estrogen receptor negative status [ER-]: Secondary | ICD-10-CM | POA: Insufficient documentation

## 2023-01-19 DIAGNOSIS — G620 Drug-induced polyneuropathy: Secondary | ICD-10-CM | POA: Diagnosis not present

## 2023-01-19 DIAGNOSIS — E1140 Type 2 diabetes mellitus with diabetic neuropathy, unspecified: Secondary | ICD-10-CM

## 2023-01-19 DIAGNOSIS — Z794 Long term (current) use of insulin: Secondary | ICD-10-CM

## 2023-01-19 LAB — COMPREHENSIVE METABOLIC PANEL
ALT: 19 U/L (ref 0–44)
AST: 21 U/L (ref 15–41)
Albumin: 4 g/dL (ref 3.5–5.0)
Alkaline Phosphatase: 44 U/L (ref 38–126)
Anion gap: 8 (ref 5–15)
BUN: 16 mg/dL (ref 8–23)
CO2: 24 mmol/L (ref 22–32)
Calcium: 9 mg/dL (ref 8.9–10.3)
Chloride: 102 mmol/L (ref 98–111)
Creatinine, Ser: 0.92 mg/dL (ref 0.44–1.00)
GFR, Estimated: >60 mL/min (ref >60)
Glucose, Bld: 216 mg/dL — ABNORMAL HIGH (ref 70–99)
Potassium: 3.3 mmol/L — ABNORMAL LOW (ref 3.5–5.1)
Sodium: 134 mmol/L — ABNORMAL LOW (ref 135–145)
Total Bilirubin: 0.5 mg/dL (ref 0.3–1.2)
Total Protein: 7.4 g/dL (ref 6.5–8.1)

## 2023-01-19 LAB — CBC WITH DIFFERENTIAL/PLATELET
Abs Immature Granulocytes: 0.02 10*3/uL (ref 0.00–0.07)
Basophils Absolute: 0.1 10*3/uL (ref 0.0–0.1)
Basophils Relative: 1 %
Eosinophils Absolute: 0.1 10*3/uL (ref 0.0–0.5)
Eosinophils Relative: 2 %
HCT: 39.3 % (ref 36.0–46.0)
Hemoglobin: 13.2 g/dL (ref 12.0–15.0)
Immature Granulocytes: 0 %
Lymphocytes Relative: 28 %
Lymphs Abs: 1.7 10*3/uL (ref 0.7–4.0)
MCH: 28.8 pg (ref 26.0–34.0)
MCHC: 33.6 g/dL (ref 30.0–36.0)
MCV: 85.6 fL (ref 80.0–100.0)
Monocytes Absolute: 0.6 10*3/uL (ref 0.1–1.0)
Monocytes Relative: 10 %
Neutro Abs: 3.6 10*3/uL (ref 1.7–7.7)
Neutrophils Relative %: 59 %
Platelets: 210 10*3/uL (ref 150–400)
RBC: 4.59 MIL/uL (ref 3.87–5.11)
RDW: 14.3 % (ref 11.5–15.5)
WBC: 6.1 10*3/uL (ref 4.0–10.5)
nRBC: 0 % (ref 0.0–0.2)

## 2023-01-19 MED ORDER — POTASSIUM CHLORIDE CRYS ER 20 MEQ PO TBCR: 20.0000 meq | EXTENDED_RELEASE_TABLET | Freq: Every day | ORAL | 0 refills | Status: DC

## 2023-01-19 MED ORDER — SODIUM CHLORIDE 0.9% FLUSH
10.0000 mL | Freq: Once | INTRAVENOUS | Status: AC
  Administered 2023-01-19: 10 mL via INTRAVENOUS
  Filled 2023-01-19: qty 10

## 2023-01-19 MED ORDER — HEPARIN SOD (PORK) LOCK FLUSH 100 UNIT/ML IV SOLN
500.0000 [IU] | Freq: Once | INTRAVENOUS | Status: AC
  Administered 2023-01-19: 500 [IU] via INTRAVENOUS
  Filled 2023-01-19: qty 5

## 2023-01-19 NOTE — Assessment & Plan Note (Addendum)
Patient prefers to keep Mediport  Continue port flush every 6-8 weeks

## 2023-01-19 NOTE — Progress Notes (Signed)
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  ER negative, PR weakly positive (<=10%), HER-2 negative, s/p neoadjuvant dose dense AC followed by 12 weekly Taxol. S/p  lumpectomy/sentinel lymph node biopsy ypTis ypN0 No residual invasive carcinoma.  Status post adjuvant radiation. Genetic testing negative.  Labs are reviewed and discussed with patient. Clinically she is doing well. Physical examination showed right breast upper outer quadrant nodule at the surgical site.  she missed mammogram in June 2024 - reschedule bilateral diagnostic mammogram  Right breast lymphedema, Follow up with physical therapy/lymphedema clinic.  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  Symptoms are stable and slightly improved.   Continue gabapentin, managed by pcp  Orders Placed This Encounter  Procedures   CBC with Differential (Cancer Center Only)    Standing Status:   Future    Standing Expiration Date:   01/19/2024   CMP (Cancer Center only)    Standing Status:   Future    Standing Expiration Date:   01/19/2024   Cancer Antigen 27.29    Standing Status:   Future    Standing Expiration Date:   01/19/2024   Cancer antigen 15-3    Standing Status:   Future    Standing Expiration Date:   01/19/2024   Follow up in 6 months All questions were answered. The patient knows to call the clinic with  any problems, questions or concerns.  Dawn Patience, MD, PhD [facility] Hematology Oncology 01/19/2023   HISTORY OF PRESENTING ILLNESS:  Patient had screening mammogram done 08/12/2017 which showed right breast asymmetry.  A diagnostic mammogram was suggested and patient did not have it done. Patient felt her right breast mass for a few weeks.  She had bilateral diagnostic mammogram done on 06/30/2019. 2.8 x 2.4 x 2.6 cm right breast mass, 11:00, 10 cm from the nipple.  There is a single mildly abnormal node in the right axilla with a cortex measuring up to 4.4 mm.  No other suspicious findings. Patient underwent ultrasound-guided core biopsy of the right breast mass and right axilla lymph node Pathology showed invasive mammary carcinoma, no special type, grade 3, ER/PR HER-2 status are pending. Right axillary lymph node biopsy showed predominantly blood in the fibroadipose tissue, with scant lymphoid tissue present.  No definite malignancy was identified.  Patient was referred to cancer center to establish care and discuss treatment plan. Menarche 13 Postmenopausal.  LMP when she was 67 years old. She recalls use of birth control pills. Denies any hormone .  Replacement therapy. Denies any prior chest radiation. She reports family history of maternal grandmother and 2 maternal cousins were diagnosed with cancer.  She does not know about details. # 09/03/2019, patient had an episode of syncope and EMS was called and patient sent to emergency room.  Patient was noted to have a heart rate 45-65 and blood glucose level of 286. Patient denies any history of nausea, vomiting, diarrhea.  Denies any dehydration.  She reported  history of intermittent abdominal pain. CT head without contrast showed no acute intracranial abnormality.  CT abdomen pelvis with contrast is negative for evidence of acute abdominal abnormality.  Patient was given IV fluid. Patient was observed with holding her blood pressure  medication including amlodipine, bisoprolol.  No arrhythmia was noted on telemetry.  Echocardiogram showed LVEF 60 to 65%.  No valvular abnormalities.  Beta-blocker and diuretics was discontinued.  # Patient was seen by cardiology Dr. Azucena Cecil for evaluation of syncope and was cleared for chemotherapy. # 10/03/2019, interval unilateral right diagnostic mammogram showed right breast mass 11:00 size has decreased to 3.7 x 1.3 x 2.6 cm.  Previously mass measured 2.8 x 2.4 x 2.6 cm  Neoadjuvant chemotherapy  08/04/2019-09/21/2019 dose dense AC 10/11/2019-01/02/2020 weekly Taxol mammogram after chemotherapy showed decreased size of cancer at that time and was sent back to surgeon for surgery. Patient no showed to Dr. Billey Chang office in September 2021  03/16/2020 bilateral breast MRI with and without contrast showed previously biopsied mass in the posterior aspect of the upper outer quadrant of the right breast is no longer visualized.  There is a small amount of low-grade linear enhancement extending posteriorly and superiorly from the location towards the right axilla.  Measuring 5.2 x 2.0 x 1.1 cm.  No evidence of malignancy elsewhere in either breast.  No adenopathy.  11/03/2019 genetic testing is negative   # 05/04/2020 right lumpectomy SLNB ypTis ypN0 High-grade DCIS with calcifications, 1 cm, no residual invasive carcinoma.  Margins not involved.  Extensive fibrosis with patchy mild chronic inflammation.  Patient had 5 sentinel lymph node excision and all lymph nodes were negative. Margins were negative  #Adjuvant radiation finished on 08/23/2020. INTERVAL HISTORY Dawn Alvarez is a 67 y.o. female who has above history reviewed by me today presents for follow up visit for management of breast cancer.  Patient reports feeling well.  She does breast examination and has no new breast complaints. Chemotherapy-induced neuropathy has improved. She takes gabapentin.  She noticed right breast nodule at  the site of previous lumpectomy . Review of Systems  Constitutional:  Negative for appetite change, chills, fatigue and fever.  HENT:   Negative for hearing loss and voice change.   Eyes:  Negative for eye problems.  Respiratory:  Negative for chest tightness and cough.   Cardiovascular:  Negative for chest pain.  Gastrointestinal:  Negative for abdominal distention, abdominal pain and blood in stool.  Endocrine: Negative for hot flashes.  Genitourinary:  Negative for difficulty urinating and frequency.   Musculoskeletal:  Positive for arthralgias.  Skin:  Negative for itching and rash.  Neurological:  Positive for numbness. Negative for extremity weakness.  Hematological:  Negative for adenopathy.  Psychiatric/Behavioral:  Negative for confusion.     MEDICAL HISTORY:  Past Medical History:  Diagnosis Date   Asthma    Cancer (HCC) 03/2020   right breast IMC   COPD exacerbation (HCC) 04/12/2016   Diabetes mellitus without complication (HCC)    Hyperlipemia    Hypertension    Personal history of chemotherapy 2021    SURGICAL HISTORY: Past Surgical History:  Procedure Laterality Date   BREAST BIOPSY Right 07/05/2019   [place] bx venus marker, grade 3 invasive mammary carcinoma   BREAST BIOPSY Right 07/05/2019   LN bx, hydromarker,PREDOMINANTLY BLOOD AND FIBROADIPOSE TISSUE, WITH SCANT LYMPHOID TISSUE PRESENT       BREAST LUMPECTOMY Right 05/04/2020   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  BREAST LUMPECTOMY WITH RADIOACTIVE SEED AND SENTINEL LYMPH NODE BIOPSY Right 05/04/2020   Procedure: RIGHT BREAST LUMPECTOMY WITH BRACKETED RADIOACTIVE SEED AND SENTINEL LYMPH NODE BIOPSY;  Surgeon: Griselda Miner, MD;  Location: [facility];  Service: General;  Laterality: Right;   CESAREAN SECTION     COLONOSCOPY WITH PROPOFOL N/A 09/11/2015   Procedure: COLONOSCOPY WITH PROPOFOL;  Surgeon: Midge Minium, MD;  Location: [facility];  Service:  Endoscopy;  Laterality: N/A;   IR IMAGING GUIDED PORT INSERTION  07/20/2019    SOCIAL HISTORY: Social History   Socioeconomic History   Marital status: Married    Spouse name: Not on file   Number of children: Not on file   Years of education: Not on file   Highest education level: Not on file  Occupational History   Not on file  Tobacco Use   Smoking status: Former    Current packs/day: 0.00    Average packs/day: 1 pack/day for 2.0 years (2.0 ttl pk-yrs)    Types: Cigarettes    Start date: 02/28/2018    Quit date: 02/29/2020    Years since quitting: 2.8   Smokeless tobacco: Never  Substance and Sexual Activity   Alcohol use: Not Currently    Comment: social   Drug use: No   Sexual activity: Not Currently    Birth control/protection: Post-menopausal  Other Topics Concern   Not on file  Social History Narrative   Not on file   Social Determinants of Health   Financial Resource Strain: Low Risk  (10/24/2020)   Overall Financial Resource Strain (CARDIA)    Difficulty of Paying Living Expenses: Not very hard  Food Insecurity: Not on file  Transportation Needs: Not on file  Physical Activity: Not on file  Stress: Not on file  Social Connections: Not on file  Intimate Partner Violence: Not on file    FAMILY HISTORY: Family History  Problem Relation Age of Onset   Diabetes Mother    Hypertension Mother    Hypertension Father    Diabetes Father    Breast cancer Neg Hx     ALLERGIES:  has No Known Allergies.  MEDICATIONS:  Current Outpatient Medications  Medication Sig Dispense Refill   Alcohol Swabs (B-D SINGLE USE SWABS REGULAR) PADS Use as directed twice a day 300 each 3   allopurinol (ZYLOPRIM) 100 MG tablet Take 1 tablet (100 mg total) by mouth at bedtime. Take 1 tablet by mouth at night for Gout 90 tablet 3   amLODipine (NORVASC) 10 MG tablet Take 1 tablet (10 mg total) by mouth daily. 90 tablet 1   atorvastatin (LIPITOR) 40 MG tablet Take 1 tablet (40 mg  total) by mouth daily. 90 tablet 3   bisoprolol (ZEBETA) 5 MG tablet Take 1 tablet (5 mg total) by mouth daily. 90 tablet 3   Blood Glucose Monitoring Suppl (TRUE METRIX AIR GLUCOSE METER) w/Device KIT 1 Device by Does not apply route in the morning and at bedtime. Use ad directed e11.65 1 kit 0   brimonidine (ALPHAGAN) 0.2 % ophthalmic solution INSTILL 1 DROP INTO BOTH EYES  TWICE DAILY 30 mL 5   Calcium Carb-Cholecalciferol (CALCIUM 600 + D) 600-200 MG-UNIT TABS Take 1 tablet by mouth daily.     celecoxib (CELEBREX) 200 MG capsule TAKE 1 CAPSULE BY MOUTH DAILY  FOR 1 WEEK AND THEN AS NEEDED 100 capsule 1   colchicine 0.6 MG tablet TAKE FOR A GOUT FLARE/ATTACK:  TAKE 2 TABLETS BY MOUTH  THEN  TAKE 1 MORE TABLET 1 HOUR LATER  ON DAY 1, THEN TAKE 1 TABLET BY  MOUTH DAILY UNTIL IT RESOLVES 100 tablet 2   cyclobenzaprine (FLEXERIL) 10 MG tablet TAKE 1 TABLET BY MOUTH AT  BEDTIME AS NEEDED FOR MUSCLE  SPASM(S) 15 tablet 0   gabapentin (NEURONTIN) 300 MG capsule Take 2 capsule by mouth in the morning, 1 capsule by mouth at lunch/midday, and 3 capsule by mouth at bedtime. 180 capsule 5   glucose blood (TRUE METRIX BLOOD GLUCOSE TEST) test strip Use as instructed twice a daily diag e11.65 100 each 3   HYDROcodone-acetaminophen (NORCO/VICODIN) 5-325 MG tablet Take 1 tablet by mouth every 6 (six) hours as needed for moderate pain or severe pain. 20 tablet 0   Insulin Pen Needle (PEN NEEDLES) 31G X 8 MM MISC Use as directed with insulin E11.65 300 each 1   LEVEMIR FLEXPEN 100 UNIT/ML FlexPen INJECT SUBCUTANEOUSLY 30 UNITS  DAILY 30 mL 2   lidocaine-prilocaine (EMLA) cream SMARTSIG:1 Topical Every Night     lisinopril (ZESTRIL) 40 MG tablet Take 1 tablet (40 mg total) by mouth daily. 90 tablet 3   nicotine polacrilex ([employer] NICOTINE) 2 MG gum Take 1 each (2 mg total) by mouth as needed for smoking cessation. 100 tablet 0   Omega-3 Fatty Acids (FISH OIL) 1000 MG CAPS Take 1,000 mg by mouth daily.      potassium  chloride SA (KLOR-CON M) 20 MEQ tablet Take 1 tablet (20 mEq total) by mouth daily. 3 tablet 0   Semaglutide, 1 MG/DOSE, (OZEMPIC, 1 MG/DOSE,) 2 MG/1.5ML SOPN Inject 1 mg into the skin once a week. FOR DM 6 mL 6   SIMBRINZA 1-0.2 % SUSP      TRUEplus Lancets 30G MISC Use as directed twice daily diag e11.65 100 each 3   No current facility-administered medications for this visit.   Facility-Administered Medications Ordered in Other Visits  Medication Dose Route Frequency Provider Last Rate Last Admin   heparin lock flush 100 UNIT/ML injection            sodium chloride flush (NS) 0.9 % injection 10 mL  10 mL Intravenous PRN Dawn Patience, MD   10 mL at 10/11/19 0817     PHYSICAL EXAMINATION: ECOG PERFORMANCE STATUS: 1 - Symptomatic but completely ambulatory Vitals:   01/19/23 1105  BP: 124/72  Pulse: (!) 58  Resp: 18  Temp: (!) 96 F (35.6 C)  SpO2: 100%   Filed Weights   01/19/23 1105  Weight: 157 lb 6.4 oz (71.4 kg)    Physical Exam Constitutional:      General: She is not in acute distress. HENT:     Head: Normocephalic and atraumatic.  Eyes:     General: No scleral icterus. Cardiovascular:     Rate and Rhythm: Normal rate and regular rhythm.     Heart sounds: Normal heart sounds.  Pulmonary:     Effort: Pulmonary effort is normal. No respiratory distress.     Breath sounds: Normal breath sounds. No wheezing.  Abdominal:     General: Bowel sounds are normal. There is no distension.     Palpations: Abdomen is soft.  Musculoskeletal:        General: No deformity. Normal range of motion.     Cervical back: Normal range of motion and neck supple.  Skin:    General: Skin is warm and dry.     Findings: No erythema or rash.  Neurological:  Mental Status: She is alert and oriented to person, place, and time. Mental status is at baseline.     Cranial Nerves: No cranial nerve deficit.     Coordination: Coordination normal.  Psychiatric:        Mood and Affect: Mood  normal.    Breast exam was performed in seated and lying down position. Patient is status post right breast lumpectomy with a well-healed surgical scar, palpable nodule at surgical site.  No palpable left breast mass. No palpable axillary adenopathy bilaterally.    LABORATORY DATA:  I have reviewed the data as listed     Latest Ref Rng & Units 01/19/2023   10:26 AM 10/28/2022   11:36 AM 07/07/2022   12:57 PM  CBC  WBC 4.0 - 10.5 K/uL 6.1  7.6  7.0   Hemoglobin 12.0 - 15.0 g/dL 25.3  66.4  40.3   Hematocrit 36.0 - 46.0 % 39.3  38.2  39.2   Platelets 150 - 400 K/uL 210  221  254       Latest Ref Rng & Units 01/19/2023   10:26 AM 10/28/2022   11:36 AM 07/07/2022   12:57 PM  CMP  Glucose 70 - 99 mg/dL 474  259  563   BUN 8 - 23 mg/dL 16  15  19    Creatinine 0.44 - 1.00 mg/dL 8.75  6.43  3.29   Sodium 135 - 145 mmol/L 134  137  137   Potassium 3.5 - 5.1 mmol/L 3.3  4.2  4.3   Chloride 98 - 111 mmol/L 102  100  102   CO2 22 - 32 mmol/L 24  21  26    Calcium 8.9 - 10.3 mg/dL 9.0  9.2  9.4   Total Protein 6.5 - 8.1 g/dL 7.4  6.7  7.7   Total Bilirubin 0.3 - 1.2 mg/dL 0.5  0.2  0.5   Alkaline Phos 38 - 126 U/L 44  59  42   AST 15 - 41 U/L 21  20  24    ALT 0 - 44 U/L 19  16  21         RADIOGRAPHIC STUDIES: I have personally reviewed the radiological images as listed and agreed with the findings in the report. No results found.

## 2023-01-19 NOTE — Assessment & Plan Note (Addendum)
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  ER negative, PR weakly positive (<=10%), HER-2 negative, s/p neoadjuvant dose dense AC followed by 12 weekly Taxol. S/p  lumpectomy/sentinel lymph node biopsy ypTis ypN0 No residual invasive carcinoma.  Status post adjuvant radiation. Genetic testing negative.  Labs are reviewed and discussed with patient. Clinically she is doing well. Physical examination showed right breast upper outer quadrant nodule at the surgical site.  she missed mammogram in June 2024 - reschedule bilateral diagnostic mammogram  Right breast lymphedema, Follow up with physical therapy/lymphedema clinic.

## 2023-01-19 NOTE — Assessment & Plan Note (Signed)
#  Chemotherapy induced neuropathy, grade 1-2.  Symptoms are stable and slightly improved.   Continue gabapentin, managed by pcp

## 2023-01-20 LAB — CANCER ANTIGEN 15-3: CA 15-3: 13.6 U/mL (ref 0.0–25.0)

## 2023-01-20 LAB — CANCER ANTIGEN 27.29: CA 27.29: 21.2 U/mL (ref 0.0–38.6)

## 2023-01-27 DIAGNOSIS — H2513 Age-related nuclear cataract, bilateral: Secondary | ICD-10-CM | POA: Diagnosis not present

## 2023-01-27 DIAGNOSIS — H40003 Preglaucoma, unspecified, bilateral: Secondary | ICD-10-CM | POA: Diagnosis not present

## 2023-02-09 ENCOUNTER — Telehealth: Payer: Self-pay | Admitting: Nurse Practitioner

## 2023-02-09 ENCOUNTER — Ambulatory Visit: Payer: 59 | Admitting: Nurse Practitioner

## 2023-02-09 NOTE — Telephone Encounter (Signed)
Called and s/w pt and rescheduled missed appt

## 2023-02-12 ENCOUNTER — Other Ambulatory Visit: Payer: 59

## 2023-02-18 ENCOUNTER — Ambulatory Visit: Payer: 59 | Admitting: Nurse Practitioner

## 2023-02-18 ENCOUNTER — Encounter: Payer: Self-pay | Admitting: Nurse Practitioner

## 2023-02-18 VITALS — BP 128/68 | HR 98 | Temp 96.8°F | Resp 16 | Ht 61.0 in | Wt 161.6 lb

## 2023-02-18 DIAGNOSIS — M10072 Idiopathic gout, left ankle and foot: Secondary | ICD-10-CM

## 2023-02-18 DIAGNOSIS — M159 Polyosteoarthritis, unspecified: Secondary | ICD-10-CM

## 2023-02-18 DIAGNOSIS — I10 Essential (primary) hypertension: Secondary | ICD-10-CM | POA: Diagnosis not present

## 2023-02-18 DIAGNOSIS — E1140 Type 2 diabetes mellitus with diabetic neuropathy, unspecified: Secondary | ICD-10-CM | POA: Diagnosis not present

## 2023-02-18 DIAGNOSIS — Z794 Long term (current) use of insulin: Secondary | ICD-10-CM | POA: Diagnosis not present

## 2023-02-18 DIAGNOSIS — E1165 Type 2 diabetes mellitus with hyperglycemia: Secondary | ICD-10-CM

## 2023-02-18 DIAGNOSIS — Z79899 Other long term (current) drug therapy: Secondary | ICD-10-CM

## 2023-02-18 DIAGNOSIS — H4010X0 Unspecified open-angle glaucoma, stage unspecified: Secondary | ICD-10-CM

## 2023-02-18 LAB — POCT GLYCOSYLATED HEMOGLOBIN (HGB A1C): Hemoglobin A1C: 8.7 % — AB (ref 4.0–5.6)

## 2023-02-18 MED ORDER — AMLODIPINE BESYLATE 10 MG PO TABS
10.0000 mg | ORAL_TABLET | Freq: Every day | ORAL | Status: DC
Start: 2023-02-18 — End: 2023-04-17

## 2023-02-18 MED ORDER — COLCHICINE 0.6 MG PO TABS: ORAL_TABLET | ORAL | 2 refills | Status: DC

## 2023-02-18 MED ORDER — BRIMONIDINE TARTRATE 0.2 % OP SOLN
OPHTHALMIC | 5 refills | Status: DC
Start: 2023-02-18 — End: 2024-04-05

## 2023-02-18 MED ORDER — ALLOPURINOL 100 MG PO TABS
100.0000 mg | ORAL_TABLET | Freq: Every evening | ORAL | 3 refills | Status: DC
Start: 2023-02-18 — End: 2024-02-18

## 2023-02-18 MED ORDER — BISOPROLOL FUMARATE 5 MG PO TABS: 5.0000 mg | ORAL_TABLET | Freq: Every day | ORAL | 3 refills | Status: DC

## 2023-02-18 MED ORDER — GABAPENTIN 300 MG PO CAPS
ORAL_CAPSULE | ORAL | 5 refills | Status: DC
Start: 2023-02-18 — End: 2023-10-12

## 2023-02-18 MED ORDER — LEVEMIR FLEXPEN 100 UNIT/ML ~~LOC~~ SOPN
34.0000 [IU] | PEN_INJECTOR | Freq: Every day | SUBCUTANEOUS | 2 refills | Status: DC
Start: 2023-02-18 — End: 2023-11-24

## 2023-02-18 MED ORDER — OZEMPIC (1 MG/DOSE) 2 MG/1.5ML ~~LOC~~ SOPN
1.0000 mg | PEN_INJECTOR | SUBCUTANEOUS | 3 refills | Status: DC
Start: 2023-02-18 — End: 2023-06-09

## 2023-02-18 MED ORDER — CELECOXIB 200 MG PO CAPS
ORAL_CAPSULE | ORAL | 1 refills | Status: DC
Start: 2023-02-18 — End: 2023-10-12

## 2023-02-18 MED ORDER — LISINOPRIL 40 MG PO TABS
40.0000 mg | ORAL_TABLET | Freq: Every day | ORAL | 3 refills | Status: DC
Start: 2023-02-18 — End: 2024-02-18

## 2023-02-18 NOTE — Progress Notes (Signed)
[facility] [address] [place], [place] [address]  Internal MEDICINE  Office Visit Note  Patient Name: Dawn Alvarez  [id number]  [id number]  Date of Service: 02/18/2023  Chief Complaint  Patient presents with   Diabetes   Hypertension   Hyperlipidemia   Follow-up    HPI Hazeleigh presents for a follow-up visit for diabetes, hypertension, breast cancer, refills Diabetes -- A1c elevated to 8.7 today from 7.4 in may 2024 Right breast cancer -- has had the port-a-cath placed and will be starting chemotherapy soon.  Quit smoking in July, great job! Hypertension -- BP controlled with current medications.  Due for multiple refills.    Current Medication: Outpatient Encounter Medications as of 02/18/2023  Medication Sig   Alcohol Swabs (B-D SINGLE USE SWABS REGULAR) PADS Use as directed twice a day   atorvastatin (LIPITOR) 40 MG tablet Take 1 tablet (40 mg total) by mouth daily.   Blood Glucose Monitoring Suppl (TRUE METRIX AIR GLUCOSE METER) w/Device KIT 1 Device by Does not apply route in the morning and at bedtime. Use ad directed e11.65   Calcium Carb-Cholecalciferol (CALCIUM 600 + D) 600-200 MG-UNIT TABS Take 1 tablet by mouth daily.   glucose blood (TRUE METRIX BLOOD GLUCOSE TEST) test strip Use as instructed twice a daily diag e11.65   Insulin Pen Needle (PEN NEEDLES) 31G X 8 MM MISC Use as directed with insulin E11.65   lidocaine-prilocaine (EMLA) cream SMARTSIG:1 Topical Every Night   nicotine polacrilex ([employer] NICOTINE) 2 MG gum Take 1 each (2 mg total) by mouth as needed for smoking cessation.   Omega-3 Fatty Acids (FISH OIL) 1000 MG CAPS Take 1,000 mg by mouth daily.    potassium chloride SA (KLOR-CON M) 20 MEQ tablet Take 1 tablet (20 mEq total) by mouth daily.   SIMBRINZA 1-0.2 % SUSP    TRUEplus Lancets 30G MISC Use as directed twice daily diag e11.65   [DISCONTINUED] allopurinol (ZYLOPRIM) 100 MG tablet Take 1 tablet (100 mg total) by mouth at bedtime.  Take 1 tablet by mouth at night for Gout   [DISCONTINUED] amLODipine (NORVASC) 10 MG tablet Take 1 tablet (10 mg total) by mouth daily.   [DISCONTINUED] bisoprolol (ZEBETA) 5 MG tablet Take 1 tablet (5 mg total) by mouth daily.   [DISCONTINUED] brimonidine (ALPHAGAN) 0.2 % ophthalmic solution INSTILL 1 DROP INTO BOTH EYES  TWICE DAILY   [DISCONTINUED] celecoxib (CELEBREX) 200 MG capsule TAKE 1 CAPSULE BY MOUTH DAILY  FOR 1 WEEK AND THEN AS NEEDED   [DISCONTINUED] colchicine 0.6 MG tablet TAKE FOR A GOUT FLARE/ATTACK:  TAKE 2 TABLETS BY MOUTH THEN  TAKE 1 MORE TABLET 1 HOUR LATER  ON DAY 1, THEN TAKE 1 TABLET BY  MOUTH DAILY UNTIL IT RESOLVES   [DISCONTINUED] cyclobenzaprine (FLEXERIL) 10 MG tablet TAKE 1 TABLET BY MOUTH AT  BEDTIME AS NEEDED FOR MUSCLE  SPASM(S)   [DISCONTINUED] gabapentin (NEURONTIN) 300 MG capsule Take 2 capsule by mouth in the morning, 1 capsule by mouth at lunch/midday, and 3 capsule by mouth at bedtime.   [DISCONTINUED] HYDROcodone-acetaminophen (NORCO/VICODIN) 5-325 MG tablet Take 1 tablet by mouth every 6 (six) hours as needed for moderate pain or severe pain.   [DISCONTINUED] LEVEMIR FLEXPEN 100 UNIT/ML FlexPen INJECT SUBCUTANEOUSLY 30 UNITS  DAILY   [DISCONTINUED] lisinopril (ZESTRIL) 40 MG tablet Take 1 tablet (40 mg total) by mouth daily.   [DISCONTINUED] Semaglutide, 1 MG/DOSE, (OZEMPIC, 1 MG/DOSE,) 2 MG/1.5ML SOPN Inject 1 mg into the skin once  a week. FOR DM   allopurinol (ZYLOPRIM) 100 MG tablet Take 1 tablet (100 mg total) by mouth at bedtime. Take 1 tablet by mouth at night for Gout   amLODipine (NORVASC) 10 MG tablet Take 1 tablet (10 mg total) by mouth daily.   bisoprolol (ZEBETA) 5 MG tablet Take 1 tablet (5 mg total) by mouth daily.   brimonidine (ALPHAGAN) 0.2 % ophthalmic solution INSTILL 1 DROP INTO BOTH EYES  TWICE DAILY   celecoxib (CELEBREX) 200 MG capsule TAKE 1 CAPSULE BY MOUTH DAILY  FOR 1 WEEK AND THEN AS NEEDED   colchicine 0.6 MG tablet TAKE FOR A  GOUT FLARE/ATTACK:  TAKE 2 TABLETS BY MOUTH THEN  TAKE 1 MORE TABLET 1 HOUR LATER  ON DAY 1, THEN TAKE 1 TABLET BY  MOUTH DAILY UNTIL IT RESOLVES   gabapentin (NEURONTIN) 300 MG capsule Take 2 capsule by mouth in the morning, 1 capsule by mouth at lunch/midday, and 3 capsule by mouth at bedtime.   insulin detemir (LEVEMIR FLEXPEN) 100 UNIT/ML FlexPen Inject 34 Units into the skin daily.   lisinopril (ZESTRIL) 40 MG tablet Take 1 tablet (40 mg total) by mouth daily.   Semaglutide, 1 MG/DOSE, (OZEMPIC, 1 MG/DOSE,) 2 MG/1.5ML SOPN Inject 1 mg into the skin once a week. 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   BREAST LUMPECTOMY WITH RADIOACTIVE SEED AND SENTINEL LYMPH NODE BIOPSY Right 05/04/2020   Procedure: RIGHT BREAST LUMPECTOMY WITH BRACKETED RADIOACTIVE SEED AND SENTINEL LYMPH NODE BIOPSY;  Surgeon: Griselda Miner, MD;  Location: [facility];  Service: General;  Laterality: Right;   CESAREAN SECTION     COLONOSCOPY WITH PROPOFOL N/A 09/11/2015   Procedure: COLONOSCOPY WITH PROPOFOL;  Surgeon: Midge Minium, MD;  Location: [facility];  Service: Endoscopy;  Laterality: N/A;   IR IMAGING GUIDED PORT INSERTION  07/20/2019    Medical History: Past Medical History:  Diagnosis Date   Asthma    Cancer (HCC) 03/2020   right breast IMC   COPD exacerbation (HCC) 04/12/2016   Diabetes mellitus without complication (HCC)    Hyperlipemia     Hypertension    Personal history of chemotherapy 2021    Family History: Family History  Problem Relation Age of Onset   Diabetes Mother    Hypertension Mother    Hypertension Father    Diabetes Father    Breast cancer Neg Hx     Social History   Socioeconomic History   Marital status: Married    Spouse name: Not on file   Number of children: Not on file   Years of education: Not on file   Highest education level: Not on file  Occupational History   Not on file  Tobacco Use   Smoking status: Former    Current packs/day: 0.00    Average packs/day: 1 pack/day for 2.0 years (2.0 ttl pk-yrs)    Types: Cigarettes    Start  date: 02/28/2018    Quit date: 02/29/2020    Years since quitting: 2.9   Smokeless tobacco: Never  Substance and Sexual Activity   Alcohol use: Not Currently    Comment: social   Drug use: No   Sexual activity: Not Currently    Birth control/protection: Post-menopausal  Other Topics Concern   Not on file  Social History Narrative   Not on file   Social Determinants of Health   Financial Resource Strain: Low Risk  (10/24/2020)   Overall Financial Resource Strain (CARDIA)    Difficulty of Paying Living Expenses: Not very hard  Food Insecurity: Not on file  Transportation Needs: Not on file  Physical Activity: Not on file  Stress: Not on file  Social Connections: Not on file  Intimate Partner Violence: Not on file      Review of Systems  Constitutional:  Negative for chills, fatigue and unexpected weight change.  HENT:  Negative for congestion, rhinorrhea, sneezing and sore throat.   Eyes:  Negative for redness.  Respiratory:  Negative for cough, chest tightness and shortness of breath.   Cardiovascular:  Negative for chest pain and palpitations.  Gastrointestinal:  Negative for abdominal pain, constipation, diarrhea, nausea and vomiting.  Genitourinary:  Negative for dysuria and frequency.  Musculoskeletal:  Negative for back pain and neck  pain.  Skin:  Negative for rash.  Neurological: Negative.  Negative for tremors and numbness.  Hematological:  Negative for adenopathy. Does not bruise/bleed easily.  Psychiatric/Behavioral:  Negative for behavioral problems (Depression), sleep disturbance and suicidal ideas. The patient is not nervous/anxious.     Vital Signs: BP 128/68   Pulse 98   Temp (!) 96.8 F (36 C)   Resp 16   Ht 5\' 1"  (1.549 m)   Wt 161 lb 9.6 oz (73.3 kg)   SpO2 96%   BMI 30.53 kg/m    Physical Exam Vitals reviewed.  Constitutional:      Appearance: Normal appearance.  HENT:     Head: Normocephalic and atraumatic.  Eyes:     Pupils: Pupils are equal, round, and reactive to light.  Cardiovascular:     Rate and Rhythm: Normal rate and regular rhythm.  Pulmonary:     Effort: Pulmonary effort is normal. No respiratory distress.  Neurological:     Mental Status: She is alert and oriented to person, place, and time.  Psychiatric:        Mood and Affect: Mood normal.        Behavior: Behavior normal.        Assessment/Plan: 1. Type 2 diabetes mellitus with diabetic neuropathy, with long-term current use of insulin (HCC) Insulin dose increased to 34 units daily, continue ozempic and follow up for A1c check in 4 months. Also follow diet modifications as discussed.  - POCT glycosylated hemoglobin (Hb A1C) - gabapentin (NEURONTIN) 300 MG capsule; Take 2 capsule by mouth in the morning, 1 capsule by mouth at lunch/midday, and 3 capsule by mouth at bedtime.  Dispense: 180 capsule; Refill: 5 - insulin detemir (LEVEMIR FLEXPEN) 100 UNIT/ML FlexPen; Inject 34 Units into the skin daily.  Dispense: 30 mL; Refill: 2 - Semaglutide, 1 MG/DOSE, (OZEMPIC, 1 MG/DOSE,) 2 MG/1.5ML SOPN; Inject 1 mg into the skin once a week. FOR DM  Dispense: 6 mL; Refill: 3  2. Essential hypertension Continue medications as prescribed, refills ordered  - amLODipine (NORVASC) 10 MG tablet; Take 1 tablet (10 mg total) by mouth  daily. - lisinopril (ZESTRIL) 40  MG tablet; Take 1 tablet (40 mg total) by mouth daily.  Dispense: 90 tablet; Refill: 3 - bisoprolol (ZEBETA) 5 MG tablet; Take 1 tablet (5 mg total) by mouth daily.  Dispense: 90 tablet; Refill: 3  3. Primary osteoarthritis involving multiple joints Continue celebrex as prescribed - celecoxib (CELEBREX) 200 MG capsule; TAKE 1 CAPSULE BY MOUTH DAILY  FOR 1 WEEK AND THEN AS NEEDED  Dispense: 100 capsule; Refill: 1  4. Encounter for medication review Medication list review, updated and refills ordered  - colchicine 0.6 MG tablet; TAKE FOR A GOUT FLARE/ATTACK:  TAKE 2 TABLETS BY MOUTH THEN  TAKE 1 MORE TABLET 1 HOUR LATER  ON DAY 1, THEN TAKE 1 TABLET BY  MOUTH DAILY UNTIL IT RESOLVES  Dispense: 100 tablet; Refill: 2 - brimonidine (ALPHAGAN) 0.2 % ophthalmic solution; INSTILL 1 DROP INTO BOTH EYES  TWICE DAILY  Dispense: 30 mL; Refill: 5 - allopurinol (ZYLOPRIM) 100 MG tablet; Take 1 tablet (100 mg total) by mouth at bedtime. Take 1 tablet by mouth at night for Gout  Dispense: 90 tablet; Refill: 3   General Counseling: Morgyn verbalizes understanding of the findings of todays visit and agrees with plan of treatment. I have discussed any further diagnostic evaluation that may be needed or ordered today. We also reviewed her medications today. she has been encouraged to call the office with any questions or concerns that should arise related to todays visit.    Orders Placed This Encounter  Procedures   POCT glycosylated hemoglobin (Hb A1C)    Meds ordered this encounter  Medications   celecoxib (CELEBREX) 200 MG capsule    Sig: TAKE 1 CAPSULE BY MOUTH DAILY  FOR 1 WEEK AND THEN AS NEEDED    Dispense:  100 capsule    Refill:  1    Please send a replace/new response with 100-Day Supply if appropriate to maximize member benefit. Requesting 1 year supply.   gabapentin (NEURONTIN) 300 MG capsule    Sig: Take 2 capsule by mouth in the morning, 1 capsule by mouth  at lunch/midday, and 3 capsule by mouth at bedtime.    Dispense:  180 capsule    Refill:  5    Note change in directions, dose and number of capsule   insulin detemir (LEVEMIR FLEXPEN) 100 UNIT/ML FlexPen    Sig: Inject 34 Units into the skin daily.    Dispense:  30 mL    Refill:  2    Please fill, patient due for refills now. Dx code E11.65   Semaglutide, 1 MG/DOSE, (OZEMPIC, 1 MG/DOSE,) 2 MG/1.5ML SOPN    Sig: Inject 1 mg into the skin once a week. FOR DM    Dispense:  6 mL    Refill:  3    E11.65. please fill for 90 days if possible.   amLODipine (NORVASC) 10 MG tablet    Sig: Take 1 tablet (10 mg total) by mouth daily.    Please note change in dose, patient is taking 1 whole tablet now instead of a half tablet. Please discontinue all previous amlodipine orders   lisinopril (ZESTRIL) 40 MG tablet    Sig: Take 1 tablet (40 mg total) by mouth daily.    Dispense:  90 tablet    Refill:  3   bisoprolol (ZEBETA) 5 MG tablet    Sig: Take 1 tablet (5 mg total) by mouth daily.    Dispense:  90 tablet    Refill:  3   colchicine  0.6 MG tablet    Sig: TAKE FOR A GOUT FLARE/ATTACK:  TAKE 2 TABLETS BY MOUTH THEN  TAKE 1 MORE TABLET 1 HOUR LATER  ON DAY 1, THEN TAKE 1 TABLET BY  MOUTH DAILY UNTIL IT RESOLVES    Dispense:  100 tablet    Refill:  2    Please send a replace/new response with 100-Day Supply if appropriate to maximize member benefit. Requesting 1 year supply.   brimonidine (ALPHAGAN) 0.2 % ophthalmic solution    Sig: INSTILL 1 DROP INTO BOTH EYES  TWICE DAILY    Dispense:  30 mL    Refill:  5    Please send a replace/new response with 100-Day Supply if appropriate to maximize member benefit. Requesting 1 year supply.   allopurinol (ZYLOPRIM) 100 MG tablet    Sig: Take 1 tablet (100 mg total) by mouth at bedtime. Take 1 tablet by mouth at night for Gout    Dispense:  90 tablet    Refill:  3    Return in about 4 months (around 06/22/2023) for F/U, Recheck A1C, Gorje Iyer  PCP.   Total time spent:30 Minutes Time spent includes review of chart, medications, test results, and follow up plan with the patient.   [place] Controlled Substance Database was reviewed by me.  This patient was seen by Sallyanne Kuster, FNP-C in collaboration with Dr. Beverely Risen as a part of collaborative care agreement.   Stanely Sexson R. Tedd Sias, MSN, FNP-C Internal medicine

## 2023-03-02 ENCOUNTER — Inpatient Hospital Stay: Payer: 59 | Attending: Oncology

## 2023-03-02 DIAGNOSIS — Z452 Encounter for adjustment and management of vascular access device: Secondary | ICD-10-CM | POA: Insufficient documentation

## 2023-03-02 DIAGNOSIS — C50411 Malignant neoplasm of upper-outer quadrant of right female breast: Secondary | ICD-10-CM | POA: Diagnosis present

## 2023-03-02 DIAGNOSIS — Z853 Personal history of malignant neoplasm of breast: Secondary | ICD-10-CM | POA: Insufficient documentation

## 2023-03-02 DIAGNOSIS — Z95828 Presence of other vascular implants and grafts: Secondary | ICD-10-CM

## 2023-03-02 MED ORDER — HEPARIN SOD (PORK) LOCK FLUSH 100 UNIT/ML IV SOLN
500.0000 [IU] | Freq: Once | INTRAVENOUS | Status: AC
  Administered 2023-03-02: 500 [IU] via INTRAVENOUS
  Filled 2023-03-02: qty 5

## 2023-03-02 MED ORDER — SODIUM CHLORIDE 0.9% FLUSH
10.0000 mL | Freq: Once | INTRAVENOUS | Status: AC
  Administered 2023-03-02: 10 mL via INTRAVENOUS
  Filled 2023-03-02: qty 10

## 2023-04-13 ENCOUNTER — Inpatient Hospital Stay: Payer: 59 | Attending: Oncology

## 2023-04-13 DIAGNOSIS — C50411 Malignant neoplasm of upper-outer quadrant of right female breast: Secondary | ICD-10-CM | POA: Diagnosis not present

## 2023-04-13 DIAGNOSIS — Z452 Encounter for adjustment and management of vascular access device: Secondary | ICD-10-CM | POA: Diagnosis not present

## 2023-04-13 DIAGNOSIS — Z95828 Presence of other vascular implants and grafts: Secondary | ICD-10-CM

## 2023-04-13 DIAGNOSIS — Z171 Estrogen receptor negative status [ER-]: Secondary | ICD-10-CM | POA: Insufficient documentation

## 2023-04-13 MED ORDER — HEPARIN SOD (PORK) LOCK FLUSH 100 UNIT/ML IV SOLN
500.0000 [IU] | Freq: Once | INTRAVENOUS | Status: AC
  Administered 2023-04-13: 500 [IU] via INTRAVENOUS
  Filled 2023-04-13: qty 5

## 2023-04-13 MED ORDER — SODIUM CHLORIDE 0.9% FLUSH
10.0000 mL | Freq: Once | INTRAVENOUS | Status: AC
  Administered 2023-04-13: 10 mL via INTRAVENOUS
  Filled 2023-04-13: qty 10

## 2023-04-15 ENCOUNTER — Encounter: Payer: Self-pay | Admitting: Nurse Practitioner

## 2023-04-15 DIAGNOSIS — E1140 Type 2 diabetes mellitus with diabetic neuropathy, unspecified: Secondary | ICD-10-CM | POA: Insufficient documentation

## 2023-04-17 ENCOUNTER — Other Ambulatory Visit: Payer: Self-pay | Admitting: Physician Assistant

## 2023-04-17 ENCOUNTER — Telehealth: Payer: Self-pay | Admitting: Physician Assistant

## 2023-04-17 DIAGNOSIS — I10 Essential (primary) hypertension: Secondary | ICD-10-CM

## 2023-04-17 MED ORDER — AMLODIPINE BESYLATE 10 MG PO TABS
10.0000 mg | ORAL_TABLET | Freq: Every day | ORAL | 0 refills | Status: DC
Start: 2023-04-17 — End: 2023-10-12

## 2023-04-17 NOTE — Telephone Encounter (Signed)
Patient called after hours requesting refill on BP meds. Lauren sent to [facility]

## 2023-04-24 ENCOUNTER — Other Ambulatory Visit: Payer: Self-pay

## 2023-04-24 NOTE — Progress Notes (Signed)
Patient taking Crestor 10mg  daily, added to med list.

## 2023-05-11 ENCOUNTER — Inpatient Hospital Stay: Payer: 59 | Attending: Oncology

## 2023-05-13 ENCOUNTER — Ambulatory Visit: Payer: 59

## 2023-05-13 ENCOUNTER — Other Ambulatory Visit: Payer: Self-pay

## 2023-05-14 ENCOUNTER — Ambulatory Visit: Payer: 59

## 2023-05-14 DIAGNOSIS — E1165 Type 2 diabetes mellitus with hyperglycemia: Secondary | ICD-10-CM

## 2023-05-15 LAB — MICROALBUMIN / CREATININE URINE RATIO
Creatinine, Urine: 76.9 mg/dL
Microalb/Creat Ratio: 68 mg/g{creat} — ABNORMAL HIGH (ref 0–29)
Microalbumin, Urine: 52.2 ug/mL

## 2023-05-21 NOTE — Progress Notes (Signed)
Slightly increased microalbuminuria, protein in urine. Kidney function labs were normal in August. Will repeat the urine test in 6 months.

## 2023-05-22 ENCOUNTER — Telehealth: Payer: Self-pay

## 2023-05-22 NOTE — Telephone Encounter (Signed)
-----   Message from [facility] sent at 05/21/2023  5:05 AM EST ----- Slightly increased microalbuminuria, protein in urine. Kidney function labs were normal in August. Will repeat the urine test in 6 months.

## 2023-05-22 NOTE — Telephone Encounter (Signed)
Patient notified

## 2023-06-01 ENCOUNTER — Other Ambulatory Visit: Payer: Self-pay

## 2023-06-09 ENCOUNTER — Other Ambulatory Visit: Payer: Self-pay

## 2023-06-09 DIAGNOSIS — E1140 Type 2 diabetes mellitus with diabetic neuropathy, unspecified: Secondary | ICD-10-CM

## 2023-06-09 MED ORDER — OZEMPIC (1 MG/DOSE) 2 MG/1.5ML ~~LOC~~ SOPN: 1.0000 mg | PEN_INJECTOR | SUBCUTANEOUS | 3 refills | Status: DC

## 2023-06-22 ENCOUNTER — Ambulatory Visit: Payer: 59 | Admitting: Nurse Practitioner

## 2023-06-29 ENCOUNTER — Ambulatory Visit (INDEPENDENT_AMBULATORY_CARE_PROVIDER_SITE_OTHER): Payer: 59 | Admitting: Nurse Practitioner

## 2023-06-29 ENCOUNTER — Encounter: Payer: Self-pay | Admitting: Nurse Practitioner

## 2023-06-29 VITALS — BP 130/72 | HR 71 | Temp 97.8°F | Resp 16 | Ht 61.0 in | Wt 167.8 lb

## 2023-06-29 DIAGNOSIS — E1169 Type 2 diabetes mellitus with other specified complication: Secondary | ICD-10-CM | POA: Diagnosis not present

## 2023-06-29 DIAGNOSIS — Z794 Long term (current) use of insulin: Secondary | ICD-10-CM

## 2023-06-29 DIAGNOSIS — E785 Hyperlipidemia, unspecified: Secondary | ICD-10-CM

## 2023-06-29 DIAGNOSIS — E1140 Type 2 diabetes mellitus with diabetic neuropathy, unspecified: Secondary | ICD-10-CM | POA: Diagnosis not present

## 2023-06-29 DIAGNOSIS — I152 Hypertension secondary to endocrine disorders: Secondary | ICD-10-CM

## 2023-06-29 DIAGNOSIS — E1159 Type 2 diabetes mellitus with other circulatory complications: Secondary | ICD-10-CM | POA: Diagnosis not present

## 2023-06-29 LAB — POCT GLYCOSYLATED HEMOGLOBIN (HGB A1C): Hemoglobin A1C: 6.5 % — AB (ref 4.0–5.6)

## 2023-06-29 MED ORDER — ACCU-CHEK GUIDE ME W/DEVICE KIT: PACK | 0 refills | Status: DC

## 2023-06-29 MED ORDER — ACCU-CHEK GUIDE TEST VI STRP: ORAL_STRIP | 12 refills | Status: DC

## 2023-06-29 MED ORDER — ROSUVASTATIN CALCIUM 10 MG PO TABS: 10.0000 mg | ORAL_TABLET | Freq: Every day | ORAL | 1 refills | Status: DC

## 2023-06-29 MED ORDER — ACCU-CHEK SOFTCLIX LANCETS MISC: 12 refills | Status: DC

## 2023-06-29 NOTE — Progress Notes (Unsigned)
[facility] [address] [place], [place] [address]  Internal MEDICINE  Office Visit Note  Patient Name: Dawn Alvarez  [id number]  [id number]  Date of Service: 06/29/2023  Chief Complaint  Patient presents with  . Diabetes  . Hypertension  . Hyperlipidemia    HPI Dawn Alvarez presents for a follow-up visit for diabetes, hypertension and high cholesterol Diabetes -- A1c has significantly improved to 6.5. patient reports that she ran out of basal insulin more than a month ago and has not been checking her glucose levels because she does not have a working glucose meter. Currently taking ozempic 1 mg weekly. Unsure if she still needs insulin but she definitely needs a new glucose meter.  Hypertension -- stable BP today. Currently taking amlodipine, bisoprolol, and lisinopril High cholesterol -- takes rosuvastatin    Current Medication: Outpatient Encounter Medications as of 06/29/2023  Medication Sig  . Accu-Chek Softclix Lancets lancets Use 1 lancet to check glucose once daily and as needed for diabetes  . Alcohol Swabs (B-D SINGLE USE SWABS REGULAR) PADS Use as directed twice a day  . allopurinol (ZYLOPRIM) 100 MG tablet Take 1 tablet (100 mg total) by mouth at bedtime. Take 1 tablet by mouth at night for Gout  . amLODipine (NORVASC) 10 MG tablet Take 1 tablet (10 mg total) by mouth daily.  . bisoprolol (ZEBETA) 5 MG tablet Take 1 tablet (5 mg total) by mouth daily.  . Blood Glucose Monitoring Suppl (ACCU-CHEK GUIDE ME) w/Device KIT Use as directed. E11.65  . Blood Glucose Monitoring Suppl (TRUE METRIX AIR GLUCOSE METER) w/Device KIT 1 Device by Does not apply route in the morning and at bedtime. Use ad directed e11.65  . brimonidine (ALPHAGAN) 0.2 % ophthalmic solution INSTILL 1 DROP INTO BOTH EYES  TWICE DAILY  . Calcium Carb-Cholecalciferol (CALCIUM 600 + D) 600-200 MG-UNIT TABS Take 1 tablet by mouth daily.  . celecoxib (CELEBREX) 200 MG capsule TAKE 1 CAPSULE BY  MOUTH DAILY  FOR 1 WEEK AND THEN AS NEEDED  . colchicine 0.6 MG tablet TAKE FOR A GOUT FLARE/ATTACK:  TAKE 2 TABLETS BY MOUTH THEN  TAKE 1 MORE TABLET 1 HOUR LATER  ON DAY 1, THEN TAKE 1 TABLET BY  MOUTH DAILY UNTIL IT RESOLVES  . gabapentin (NEURONTIN) 300 MG capsule Take 2 capsule by mouth in the morning, 1 capsule by mouth at lunch/midday, and 3 capsule by mouth at bedtime.  Marland Kitchen glucose blood (ACCU-CHEK GUIDE TEST) test strip Use 1 test strip to check glucose 3 times daily as needed for diabetes E11.65  . insulin detemir (LEVEMIR FLEXPEN) 100 UNIT/ML FlexPen Inject 34 Units into the skin daily.  . Insulin Pen Needle (PEN NEEDLES) 31G X 8 MM MISC Use as directed with insulin E11.65  . lidocaine-prilocaine (EMLA) cream SMARTSIG:1 Topical Every Night  . lisinopril (ZESTRIL) 40 MG tablet Take 1 tablet (40 mg total) by mouth daily.  . nicotine polacrilex ([employer] NICOTINE) 2 MG gum Take 1 each (2 mg total) by mouth as needed for smoking cessation.  . Omega-3 Fatty Acids (FISH OIL) 1000 MG CAPS Take 1,000 mg by mouth daily.   . potassium chloride SA (KLOR-CON M) 20 MEQ tablet Take 1 tablet (20 mEq total) by mouth daily.  . Semaglutide, 1 MG/DOSE, (OZEMPIC, 1 MG/DOSE,) 2 MG/1.5ML SOPN Inject 1 mg into the skin once a week. FOR DM  . SIMBRINZA 1-0.2 % SUSP   . [DISCONTINUED] atorvastatin (LIPITOR) 40 MG tablet Take 1 tablet (40 mg  total) by mouth daily.  . [DISCONTINUED] glucose blood (TRUE METRIX BLOOD GLUCOSE TEST) test strip Use as instructed twice a daily diag e11.65  . [DISCONTINUED] rosuvastatin (CRESTOR) 10 MG tablet Take 10 mg by mouth daily.  . [DISCONTINUED] TRUEplus Lancets 30G MISC Use as directed twice daily diag e11.65  . rosuvastatin (CRESTOR) 10 MG tablet Take 1 tablet (10 mg total) by mouth daily.   Facility-Administered Encounter Medications as of 06/29/2023  Medication  . heparin lock flush 100 UNIT/ML injection  . sodium chloride flush (NS) 0.9 % injection 10 mL    Surgical  History: Past Surgical History:  Procedure Laterality Date  . BREAST BIOPSY Right 07/05/2019   [place] bx venus marker, grade 3 invasive mammary carcinoma  . BREAST BIOPSY Right 07/05/2019   LN bx, hydromarker,PREDOMINANTLY BLOOD AND FIBROADIPOSE TISSUE, WITH SCANT LYMPHOID TISSUE PRESENT      . 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 BREAST LUMPECTOMY WITH RADIOACTIVE SEED AND SENTINEL LYMPH NODE BIOPSY Right 05/04/2020   Procedure: RIGHT BREAST LUMPECTOMY WITH BRACKETED RADIOACTIVE SEED AND SENTINEL LYMPH NODE BIOPSY;  Surgeon: Griselda Miner, MD;  Location: [facility];  Service: General;  Laterality: Right;  . CESAREAN SECTION    . COLONOSCOPY WITH PROPOFOL N/A 09/11/2015   Procedure: COLONOSCOPY WITH PROPOFOL;  Surgeon: Midge Minium, MD;  Location: [facility];  Service: Endoscopy;  Laterality: N/A;  . IR IMAGING GUIDED PORT INSERTION  07/20/2019    Medical History: Past Medical History:  Diagnosis Date  . Asthma   . Cancer (HCC) 03/2020   right breast IMC  . COPD exacerbation (HCC) 04/12/2016  . Diabetes mellitus without complication (HCC)   . Hyperlipemia   . Hypertension   . Personal history of chemotherapy 2021    Family History: Family History  Problem Relation Age of Onset  . Diabetes Mother   . Hypertension Mother   . Hypertension Father   . Diabetes Father   . Breast cancer Neg Hx     Social History   Socioeconomic History  . Marital status: Married    Spouse name: Not on file  . Number of children: Not on file  . Years of education: Not on file  . Highest education level: Not on file  Occupational History  . Not on file  Tobacco Use  . Smoking status: Every Day    Current packs/day: 0.00    Average packs/day: 1 pack/day for 2.0 years (2.0 ttl pk-yrs)    Types: Cigarettes    Start date: 02/28/2018    Last attempt to quit: 02/29/2020    Years since quitting: 3.3  .  Smokeless tobacco: Never  Substance and Sexual Activity  . Alcohol use: Not Currently    Comment: social  . Drug use: No  . Sexual activity: Not Currently    Birth control/protection: Post-menopausal  Other Topics Concern  . Not on file  Social History Narrative  . Not on file   Social Drivers of Health   Financial Resource Strain: Low Risk  (10/24/2020)   Overall Financial Resource Strain (CARDIA)   . Difficulty of Paying Living Expenses: Not very hard  Food Insecurity: Not on file  Transportation Needs: Not on file  Physical Activity: Not on file  Stress: Not on file  Social Connections: Not on file  Intimate Partner Violence: Not on file      Review of Systems  Constitutional:  Negative  for chills, fatigue and unexpected weight change.  HENT:  Negative for congestion, rhinorrhea, sneezing and sore throat.   Eyes:  Negative for redness.  Respiratory:  Negative for cough, chest tightness and shortness of breath.   Cardiovascular:  Negative for chest pain and palpitations.  Gastrointestinal:  Negative for abdominal pain, constipation, diarrhea, nausea and vomiting.  Genitourinary:  Negative for dysuria and frequency.  Musculoskeletal:  Negative for back pain and neck pain.  Skin:  Negative for rash.  Neurological: Negative.  Negative for tremors and numbness.  Hematological:  Negative for adenopathy. Does not bruise/bleed easily.  Psychiatric/Behavioral:  Negative for behavioral problems (Depression), sleep disturbance and suicidal ideas. The patient is not nervous/anxious.     Vital Signs: BP 130/72   Pulse 71   Temp 97.8 F (36.6 C)   Resp 16   Ht 5\' 1"  (1.549 m)   Wt 167 lb 12.8 oz (76.1 kg)   SpO2 98%   BMI 31.71 kg/m    Physical Exam Vitals reviewed.  Constitutional:      Appearance: Normal appearance.  HENT:     Head: Normocephalic and atraumatic.  Eyes:     Pupils: Pupils are equal, round, and reactive to light.  Cardiovascular:     Rate and  Rhythm: Normal rate and regular rhythm.  Pulmonary:     Effort: Pulmonary effort is normal. No respiratory distress.  Neurological:     Mental Status: She is alert and oriented to person, place, and time.  Psychiatric:        Mood and Affect: Mood normal.        Behavior: Behavior normal.       Assessment/Plan: 1. Type 2 diabetes mellitus with diabetic neuropathy, with long-term current use of insulin (HCC) (Primary) *** - POCT glycosylated hemoglobin (Hb A1C) - Blood Glucose Monitoring Suppl (ACCU-CHEK GUIDE ME) w/Device KIT; Use as directed. E11.65  Dispense: 1 kit; Refill: 0 - Accu-Chek Softclix Lancets lancets; Use 1 lancet to check glucose once daily and as needed for diabetes  Dispense: 100 each; Refill: 12 - glucose blood (ACCU-CHEK GUIDE TEST) test strip; Use 1 test strip to check glucose 3 times daily as needed for diabetes E11.65  Dispense: 100 each; Refill: 12  2. Hypertension associated with type 2 diabetes mellitus (HCC) ***  3. Hyperlipidemia associated with type 2 diabetes mellitus (HCC) *** - rosuvastatin (CRESTOR) 10 MG tablet; Take 1 tablet (10 mg total) by mouth daily.  Dispense: 90 tablet; Refill: 1   General Counseling: Sadaf verbalizes understanding of the findings of todays visit and agrees with plan of treatment. I have discussed any further diagnostic evaluation that may be needed or ordered today. We also reviewed her medications today. she has been encouraged to call the office with any questions or concerns that should arise related to todays visit.    Orders Placed This Encounter  Procedures  . POCT glycosylated hemoglobin (Hb A1C)    Meds ordered this encounter  Medications  . Blood Glucose Monitoring Suppl (ACCU-CHEK GUIDE ME) w/Device KIT    Sig: Use as directed. E11.65    Dispense:  1 kit    Refill:  0  . Accu-Chek Softclix Lancets lancets    Sig: Use 1 lancet to check glucose once daily and as needed for diabetes    Dispense:  100 each     Refill:  12  . glucose blood (ACCU-CHEK GUIDE TEST) test strip    Sig: Use 1 test strip to check glucose  3 times daily as needed for diabetes E11.65    Dispense:  100 each    Refill:  12  . rosuvastatin (CRESTOR) 10 MG tablet    Sig: Take 1 tablet (10 mg total) by mouth daily.    Dispense:  90 tablet    Refill:  1    Return in about 3 weeks (around 07/20/2023) for F/U, Desiraye Rolfson PCP being log of fasting glucose levels, will discuss insulin. .   Total time spent:30 Minutes Time spent includes review of chart, medications, test results, and follow up plan with the patient.   [place] Controlled Substance Database was reviewed by me.  This patient was seen by Sallyanne Kuster, FNP-C in collaboration with Dr. Beverely Risen as a part of collaborative care agreement.   Josslin Sanjuan R. Tedd Sias, MSN, FNP-C Internal medicine

## 2023-06-30 ENCOUNTER — Encounter: Payer: Self-pay | Admitting: Nurse Practitioner

## 2023-07-16 ENCOUNTER — Other Ambulatory Visit: Payer: Self-pay

## 2023-07-16 DIAGNOSIS — I10 Essential (primary) hypertension: Secondary | ICD-10-CM

## 2023-07-16 MED ORDER — BISOPROLOL FUMARATE 5 MG PO TABS: 5.0000 mg | ORAL_TABLET | Freq: Every day | ORAL | 3 refills | Status: DC

## 2023-07-20 ENCOUNTER — Encounter: Payer: Self-pay | Admitting: Oncology

## 2023-07-20 ENCOUNTER — Inpatient Hospital Stay (HOSPITAL_BASED_OUTPATIENT_CLINIC_OR_DEPARTMENT_OTHER): Payer: 59 | Admitting: Oncology

## 2023-07-20 ENCOUNTER — Inpatient Hospital Stay: Payer: 59 | Attending: Oncology

## 2023-07-20 VITALS — BP 161/77 | HR 64 | Temp 98.6°F | Resp 20 | Wt 167.8 lb

## 2023-07-20 DIAGNOSIS — Z95828 Presence of other vascular implants and grafts: Secondary | ICD-10-CM

## 2023-07-20 DIAGNOSIS — Z171 Estrogen receptor negative status [ER-]: Secondary | ICD-10-CM

## 2023-07-20 DIAGNOSIS — G620 Drug-induced polyneuropathy: Secondary | ICD-10-CM | POA: Diagnosis not present

## 2023-07-20 DIAGNOSIS — C50411 Malignant neoplasm of upper-outer quadrant of right female breast: Secondary | ICD-10-CM | POA: Insufficient documentation

## 2023-07-20 DIAGNOSIS — T451X5A Adverse effect of antineoplastic and immunosuppressive drugs, initial encounter: Secondary | ICD-10-CM | POA: Diagnosis not present

## 2023-07-20 LAB — CBC WITH DIFFERENTIAL (CANCER CENTER ONLY)
Abs Immature Granulocytes: 0.04 10*3/uL (ref 0.00–0.07)
Basophils Absolute: 0.1 10*3/uL (ref 0.0–0.1)
Basophils Relative: 1 %
Eosinophils Absolute: 0.2 10*3/uL (ref 0.0–0.5)
Eosinophils Relative: 3 %
HCT: 37.1 % (ref 36.0–46.0)
Hemoglobin: 12.6 g/dL (ref 12.0–15.0)
Immature Granulocytes: 1 %
Lymphocytes Relative: 38 %
Lymphs Abs: 2.7 10*3/uL (ref 0.7–4.0)
MCH: 29.3 pg (ref 26.0–34.0)
MCHC: 34 g/dL (ref 30.0–36.0)
MCV: 86.3 fL (ref 80.0–100.0)
Monocytes Absolute: 0.5 10*3/uL (ref 0.1–1.0)
Monocytes Relative: 8 %
Neutro Abs: 3.5 10*3/uL (ref 1.7–7.7)
Neutrophils Relative %: 49 %
Platelet Count: 231 10*3/uL (ref 150–400)
RBC: 4.3 MIL/uL (ref 3.87–5.11)
RDW: 14.5 % (ref 11.5–15.5)
WBC Count: 7 10*3/uL (ref 4.0–10.5)
nRBC: 0 % (ref 0.0–0.2)

## 2023-07-20 LAB — CMP (CANCER CENTER ONLY)
ALT: 19 U/L (ref 0–44)
AST: 23 U/L (ref 15–41)
Albumin: 3.9 g/dL (ref 3.5–5.0)
Alkaline Phosphatase: 40 U/L (ref 38–126)
Anion gap: 9 (ref 5–15)
BUN: 22 mg/dL (ref 8–23)
CO2: 24 mmol/L (ref 22–32)
Calcium: 8.8 mg/dL — ABNORMAL LOW (ref 8.9–10.3)
Chloride: 104 mmol/L (ref 98–111)
Creatinine: 1.04 mg/dL — ABNORMAL HIGH (ref 0.44–1.00)
GFR, Estimated: 59 mL/min — ABNORMAL LOW (ref >60)
Glucose, Bld: 114 mg/dL — ABNORMAL HIGH (ref 70–99)
Potassium: 3.5 mmol/L (ref 3.5–5.1)
Sodium: 137 mmol/L (ref 135–145)
Total Bilirubin: 0.7 mg/dL (ref 0.0–1.2)
Total Protein: 7.4 g/dL (ref 6.5–8.1)

## 2023-07-20 MED ORDER — SODIUM CHLORIDE 0.9% FLUSH
10.0000 mL | INTRAVENOUS | Status: DC | PRN
  Administered 2023-07-20: 10 mL via INTRAVENOUS
  Filled 2023-07-20: qty 10

## 2023-07-20 MED ORDER — HEPARIN SOD (PORK) LOCK FLUSH 100 UNIT/ML IV SOLN
500.0000 [IU] | Freq: Once | INTRAVENOUS | Status: AC
  Administered 2023-07-20: 500 [IU] via INTRAVENOUS
  Filled 2023-07-20: qty 5

## 2023-07-20 NOTE — Assessment & Plan Note (Signed)
Patient prefers to keep Mediport  Continue port flush every 6-8 weeks

## 2023-07-20 NOTE — Progress Notes (Signed)
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  ER negative, PR weakly positive (<=10%), HER-2 negative, s/p neoadjuvant dose dense AC followed by 12 weekly Taxol. S/p  lumpectomy/sentinel lymph node biopsy ypTis ypN0 No residual invasive carcinoma.  Status post adjuvant radiation. Genetic testing negative.  Labs are reviewed and discussed with patient. Clinically she is doing well. Physical examination showed right breast upper outer quadrant tissue thickening at the surgical site.  she missed mammogram in June 2024 - overdue now. reschedule bilateral diagnostic mammogram  Right breast lymphedema, Follow up with physical therapy/lymphedema clinic.  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  Symptoms are stable and slightly improved.   Continue gabapentin, managed by pcp  Port-A-Cath in place Patient prefers to keep Mediport  Continue port flush every 6-8 weeks  Orders Placed This Encounter  Procedures   MM 3D DIAGNOSTIC MAMMOGRAM BILATERAL BREAST    Standing Status:   Future    Expected Date:   08/03/2023    Expiration Date:   07/19/2024    Reason for Exam (SYMPTOM  OR DIAGNOSIS REQUIRED):   hx breast cancer    Preferred imaging location?:   [facility]   [place] LIMITED ULTRASOUND INCLUDING AXILLA LEFT BREAST     Standing Status:   Future    Expected Date:   08/03/2023    Expiration Date:   07/19/2024    Reason for Exam (SYMPTOM  OR DIAGNOSIS REQUIRED):   hx breast  cancer    Preferred imaging location?:   [facility]   [place] LIMITED ULTRASOUND INCLUDING AXILLA RIGHT BREAST    Standing Status:   Future    Expected Date:   08/03/2023    Expiration Date:   07/19/2024    Reason for Exam (SYMPTOM  OR DIAGNOSIS REQUIRED):   hx breast cancer    Preferred imaging location?:   [facility]   CBC with Differential (Cancer Center Only)    Standing Status:   Future    Expected Date:   01/17/2024    Expiration Date:   07/19/2024   CMP (Cancer Center only)    Standing Status:   Future    Expected Date:   01/17/2024    Expiration Date:   07/19/2024   Follow up in 6 months All questions were answered. The patient knows to call the clinic with any problems, questions or concerns.  [name], MD, PhD [facility] Hematology Oncology 07/20/2023   HISTORY OF PRESENTING ILLNESS:  Patient had screening mammogram done 08/12/2017 which showed right breast asymmetry.  A diagnostic mammogram was suggested and patient did not have it done. Patient felt her right breast mass for a few weeks.  She had bilateral diagnostic mammogram done on 06/30/2019. 2.8 x 2.4 x 2.6 cm right breast mass, 11:00, 10 cm from the nipple.  There is a single mildly abnormal node in the right axilla with a cortex measuring up to 4.4 mm.  No other suspicious findings. Patient underwent ultrasound-guided core biopsy of the right breast mass and right axilla lymph node Pathology showed invasive  mammary carcinoma, no special type, grade 3, ER/PR HER-2 status are pending. Right axillary lymph node biopsy showed predominantly blood in the fibroadipose tissue, with scant lymphoid tissue present.  No definite malignancy was identified.  Patient was referred to cancer center to establish care and discuss treatment plan. Menarche 13 Postmenopausal.  LMP when she was 68 years old. She recalls use of birth control pills. Denies any hormone .  Replacement therapy. Denies any prior chest radiation. She  reports family history of maternal grandmother and 2 maternal cousins were diagnosed with cancer.  She does not know about details. # 09/03/2019, patient had an episode of syncope and EMS was called and patient sent to emergency room.  Patient was noted to have a heart rate 45-65 and blood glucose level of 286. Patient denies any history of nausea, vomiting, diarrhea.  Denies any dehydration.  She reported history of intermittent abdominal pain. CT head without contrast showed no acute intracranial abnormality.  CT abdomen pelvis with contrast is negative for evidence of acute abdominal abnormality.  Patient was given IV fluid. Patient was observed with holding her blood pressure medication including amlodipine, bisoprolol.  No arrhythmia was noted on telemetry.  Echocardiogram showed LVEF 60 to 65%.  No valvular abnormalities.  Beta-blocker and diuretics was discontinued.  # Patient was seen by cardiology Dr. Azucena Cecil for evaluation of syncope and was cleared for chemotherapy. # 10/03/2019, interval unilateral right diagnostic mammogram showed right breast mass 11:00 size has decreased to 3.7 x 1.3 x 2.6 cm.  Previously mass measured 2.8 x 2.4 x 2.6 cm  Neoadjuvant chemotherapy  08/04/2019-09/21/2019 dose dense AC 10/11/2019-01/02/2020 weekly Taxol mammogram after chemotherapy showed decreased size of cancer at that time and was sent back to surgeon for surgery. Patient no showed to Dr. Billey Chang office in September 2021  03/16/2020 bilateral breast MRI with and without contrast showed previously biopsied mass in the posterior aspect of the upper outer quadrant of the right breast is no longer visualized.  There is a small amount of low-grade linear enhancement extending posteriorly and superiorly from the location towards the right axilla.  Measuring 5.2 x 2.0 x 1.1 cm.  No evidence of malignancy elsewhere in either breast.  No adenopathy.  11/03/2019 genetic testing is negative   # 05/04/2020 right  lumpectomy SLNB ypTis ypN0 High-grade DCIS with calcifications, 1 cm, no residual invasive carcinoma.  Margins not involved.  Extensive fibrosis with patchy mild chronic inflammation.  Patient had 5 sentinel lymph node excision and all lymph nodes were negative. Margins were negative  #Adjuvant radiation finished on 08/23/2020. INTERVAL HISTORY TALIBAH COLASURDO is a 68 y.o. female who has above history reviewed by me today presents for follow up visit for management of breast cancer.  Patient reports feeling well.  She does breast examination and has no new breast complaints. Chemotherapy-induced neuropathy has improved. She takes gabapentin.  She noticed right breast nodule at the site of previous lumpectomy -overdue for mammogram  . Review of Systems  Constitutional:  Negative for appetite change, chills, fatigue and fever.  HENT:   Negative for hearing loss and voice change.   Eyes:  Negative for eye problems.  Respiratory:  Negative for chest tightness and cough.   Cardiovascular:  Negative for chest pain.  Gastrointestinal:  Negative for abdominal distention, abdominal pain and blood in stool.  Endocrine: Negative for hot flashes.  Genitourinary:  Negative for difficulty urinating and frequency.   Musculoskeletal:  Positive for arthralgias.  Skin:  Negative  for itching and rash.  Neurological:  Positive for numbness. Negative for extremity weakness.  Hematological:  Negative for adenopathy.  Psychiatric/Behavioral:  Negative for confusion.     MEDICAL HISTORY:  Past Medical History:  Diagnosis Date   Asthma    Cancer (HCC) 03/2020   right breast IMC   COPD exacerbation (HCC) 04/12/2016   Diabetes mellitus without complication (HCC)    Hyperlipemia    Hypertension    Personal history of chemotherapy 2021    SURGICAL HISTORY: Past Surgical History:  Procedure Laterality Date   BREAST BIOPSY Right 07/05/2019   [place] bx venus marker, grade 3 invasive mammary carcinoma    BREAST BIOPSY Right 07/05/2019   LN bx, hydromarker,PREDOMINANTLY BLOOD AND FIBROADIPOSE TISSUE, WITH SCANT LYMPHOID TISSUE PRESENT       BREAST LUMPECTOMY Right 05/04/2020   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   BREAST LUMPECTOMY WITH RADIOACTIVE SEED AND SENTINEL LYMPH NODE BIOPSY Right 05/04/2020   Procedure: RIGHT BREAST LUMPECTOMY WITH BRACKETED RADIOACTIVE SEED AND SENTINEL LYMPH NODE BIOPSY;  Surgeon: Griselda Miner, MD;  Location: [facility];  Service: General;  Laterality: Right;   CESAREAN SECTION     COLONOSCOPY WITH PROPOFOL N/A 09/11/2015   Procedure: COLONOSCOPY WITH PROPOFOL;  Surgeon: Midge Minium, MD;  Location: [facility];  Service: Endoscopy;  Laterality: N/A;   IR IMAGING GUIDED PORT INSERTION  07/20/2019    SOCIAL HISTORY: Social History   Socioeconomic History   Marital status: Married    Spouse name: Not on file   Number of children: Not on file   Years of education: Not on file   Highest education level: Not on file  Occupational History   Not on file  Tobacco Use   Smoking status: Every Day    Current packs/day: 0.00    Average packs/day: 1 pack/day for 2.0 years (2.0 ttl pk-yrs)    Types: Cigarettes    Start date: 02/28/2018    Last attempt to quit: 02/29/2020    Years since quitting: 3.3   Smokeless tobacco: Never  Substance and Sexual Activity   Alcohol use: Not Currently    Comment: social   Drug use: No   Sexual activity: Not Currently    Birth control/protection: Post-menopausal  Other Topics Concern   Not on file  Social History Narrative   Not on file   Social Drivers of Health   Financial Resource Strain: Low Risk  (10/24/2020)   Overall Financial Resource Strain (CARDIA)    Difficulty of Paying Living Expenses: Not very hard  Food Insecurity: Not on file  Transportation Needs: Not on file  Physical Activity: Not on file  Stress: Not on file  Social Connections: Not on file   Intimate Partner Violence: Not on file    FAMILY HISTORY: Family History  Problem Relation Age of Onset   Diabetes Mother    Hypertension Mother    Hypertension Father    Diabetes Father    Breast cancer Neg Hx     ALLERGIES:  has no known allergies.  MEDICATIONS:  Current Outpatient Medications  Medication Sig Dispense Refill   Accu-Chek Softclix Lancets lancets Use 1 lancet to check glucose once daily and as needed for diabetes 100 each 12   Alcohol Swabs (B-D SINGLE USE SWABS REGULAR) PADS Use as directed twice a day 300 each 3   allopurinol (ZYLOPRIM) 100 MG tablet Take 1 tablet (100 mg total) by mouth at bedtime. Take  1 tablet by mouth at night for Gout 90 tablet 3   amLODipine (NORVASC) 10 MG tablet Take 1 tablet (10 mg total) by mouth daily. 90 tablet 0   bisoprolol (ZEBETA) 5 MG tablet Take 1 tablet (5 mg total) by mouth daily. 90 tablet 3   Blood Glucose Monitoring Suppl (ACCU-CHEK GUIDE ME) w/Device KIT Use as directed. E11.65 1 kit 0   Blood Glucose Monitoring Suppl (TRUE METRIX AIR GLUCOSE METER) w/Device KIT 1 Device by Does not apply route in the morning and at bedtime. Use ad directed e11.65 1 kit 0   brimonidine (ALPHAGAN) 0.2 % ophthalmic solution INSTILL 1 DROP INTO BOTH EYES  TWICE DAILY 30 mL 5   Calcium Carb-Cholecalciferol (CALCIUM 600 + D) 600-200 MG-UNIT TABS Take 1 tablet by mouth daily.     celecoxib (CELEBREX) 200 MG capsule TAKE 1 CAPSULE BY MOUTH DAILY  FOR 1 WEEK AND THEN AS NEEDED 100 capsule 1   colchicine 0.6 MG tablet TAKE FOR A GOUT FLARE/ATTACK:  TAKE 2 TABLETS BY MOUTH THEN  TAKE 1 MORE TABLET 1 HOUR LATER  ON DAY 1, THEN TAKE 1 TABLET BY  MOUTH DAILY UNTIL IT RESOLVES 100 tablet 2   gabapentin (NEURONTIN) 300 MG capsule Take 2 capsule by mouth in the morning, 1 capsule by mouth at lunch/midday, and 3 capsule by mouth at bedtime. 180 capsule 5   glucose blood (ACCU-CHEK GUIDE TEST) test strip Use 1 test strip to check glucose 3 times daily as  needed for diabetes E11.65 100 each 12   insulin detemir (LEVEMIR FLEXPEN) 100 UNIT/ML FlexPen Inject 34 Units into the skin daily. 30 mL 2   Insulin Pen Needle (PEN NEEDLES) 31G X 8 MM MISC Use as directed with insulin E11.65 300 each 1   lidocaine-prilocaine (EMLA) cream SMARTSIG:1 Topical Every Night     lisinopril (ZESTRIL) 40 MG tablet Take 1 tablet (40 mg total) by mouth daily. 90 tablet 3   nicotine polacrilex ([employer] NICOTINE) 2 MG gum Take 1 each (2 mg total) by mouth as needed for smoking cessation. 100 tablet 0   Omega-3 Fatty Acids (FISH OIL) 1000 MG CAPS Take 1,000 mg by mouth daily.      potassium chloride SA (KLOR-CON M) 20 MEQ tablet Take 1 tablet (20 mEq total) by mouth daily. 3 tablet 0   rosuvastatin (CRESTOR) 10 MG tablet Take 1 tablet (10 mg total) by mouth daily. 90 tablet 1   Semaglutide, 1 MG/DOSE, (OZEMPIC, 1 MG/DOSE,) 2 MG/1.5ML SOPN Inject 1 mg into the skin once a week. FOR DM 6 mL 3   SIMBRINZA 1-0.2 % SUSP      No current facility-administered medications for this visit.   Facility-Administered Medications Ordered in Other Visits  Medication Dose Route Frequency Provider Last Rate Last Admin   heparin lock flush 100 UNIT/ML injection            sodium chloride flush (NS) 0.9 % injection 10 mL  10 mL Intravenous PRN [name], MD   10 mL at 10/11/19 0817     PHYSICAL EXAMINATION: ECOG PERFORMANCE STATUS: 1 - Symptomatic but completely ambulatory Vitals:   07/20/23 1053  BP: (!) 161/77  Pulse: 64  Resp: 20  Temp: 98.6 F (37 C)  SpO2: 100%   Filed Weights   07/20/23 1053  Weight: 167 lb 12.8 oz (76.1 kg)    Physical Exam Constitutional:      General: She is not in acute distress. HENT:  Head: Normocephalic and atraumatic.  Eyes:     General: No scleral icterus. Cardiovascular:     Rate and Rhythm: Normal rate and regular rhythm.     Heart sounds: Normal heart sounds.  Pulmonary:     Effort: Pulmonary effort is normal. No respiratory distress.      Breath sounds: Normal breath sounds. No wheezing.  Abdominal:     General: Bowel sounds are normal. There is no distension.     Palpations: Abdomen is soft.  Musculoskeletal:        General: No deformity. Normal range of motion.     Cervical back: Normal range of motion and neck supple.  Skin:    General: Skin is warm and dry.     Findings: No erythema or rash.  Neurological:     Mental Status: She is alert and oriented to person, place, and time. Mental status is at baseline.     Cranial Nerves: No cranial nerve deficit.     Coordination: Coordination normal.  Psychiatric:        Mood and Affect: Mood normal.    Breast exam was performed in seated and lying down position. Patient is status post right breast lumpectomy with a well-healed surgical scar, palpable nodule at surgical site.  No palpable left breast mass. No palpable axillary adenopathy bilaterally.    LABORATORY DATA:  I have reviewed the data as listed     Latest Ref Rng & Units 07/20/2023   10:43 AM 01/19/2023   10:26 AM 10/28/2022   11:36 AM  CBC  WBC 4.0 - 10.5 K/uL 7.0  6.1  7.6   Hemoglobin 12.0 - 15.0 g/dL 65.7  84.6  96.2   Hematocrit 36.0 - 46.0 % 37.1  39.3  38.2   Platelets 150 - 400 K/uL 231  210  221       Latest Ref Rng & Units 07/20/2023   10:43 AM 01/19/2023   10:26 AM 10/28/2022   11:36 AM  CMP  Glucose 70 - 99 mg/dL 952  841  324   BUN 8 - 23 mg/dL 22  16  15    Creatinine 0.44 - 1.00 mg/dL 4.01  0.27  2.53   Sodium 135 - 145 mmol/L 137  134  137   Potassium 3.5 - 5.1 mmol/L 3.5  3.3  4.2   Chloride 98 - 111 mmol/L 104  102  100   CO2 22 - 32 mmol/L 24  24  21    Calcium 8.9 - 10.3 mg/dL 8.8  9.0  9.2   Total Protein 6.5 - 8.1 g/dL 7.4  7.4  6.7   Total Bilirubin 0.0 - 1.2 mg/dL 0.7  0.5  0.2   Alkaline Phos 38 - 126 U/L 40  44  59   AST 15 - 41 U/L 23  21  20    ALT 0 - 44 U/L 19  19  16         RADIOGRAPHIC STUDIES: I have personally reviewed the radiological images as listed  and agreed with the findings in the report. No results found.

## 2023-07-20 NOTE — Assessment & Plan Note (Signed)
#  Chemotherapy induced neuropathy, grade 1-2.  Symptoms are stable and slightly improved.   Continue gabapentin, managed by pcp

## 2023-07-20 NOTE — Assessment & Plan Note (Addendum)
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  ER negative, PR weakly positive (<=10%), HER-2 negative, s/p neoadjuvant dose dense AC followed by 12 weekly Taxol. S/p  lumpectomy/sentinel lymph node biopsy ypTis ypN0 No residual invasive carcinoma.  Status post adjuvant radiation. Genetic testing negative.  Labs are reviewed and discussed with patient. Clinically she is doing well. Physical examination showed right breast upper outer quadrant tissue thickening at the surgical site.  she missed mammogram in June 2024 - overdue now. reschedule bilateral diagnostic mammogram  Right breast lymphedema, Follow up with physical therapy/lymphedema clinic.

## 2023-07-21 LAB — CANCER ANTIGEN 15-3: CA 15-3: 13.5 U/mL (ref 0.0–25.0)

## 2023-07-21 LAB — CANCER ANTIGEN 27.29: CA 27.29: 15.3 U/mL (ref 0.0–38.6)

## 2023-07-22 ENCOUNTER — Ambulatory Visit: Payer: 59 | Admitting: Nurse Practitioner

## 2023-09-08 ENCOUNTER — Other Ambulatory Visit: Payer: Self-pay | Admitting: Oncology

## 2023-09-08 DIAGNOSIS — Z1231 Encounter for screening mammogram for malignant neoplasm of breast: Secondary | ICD-10-CM

## 2023-09-14 ENCOUNTER — Inpatient Hospital Stay: Payer: 59 | Attending: Oncology

## 2023-09-14 DIAGNOSIS — Z452 Encounter for adjustment and management of vascular access device: Secondary | ICD-10-CM | POA: Diagnosis present

## 2023-09-14 DIAGNOSIS — C50411 Malignant neoplasm of upper-outer quadrant of right female breast: Secondary | ICD-10-CM | POA: Insufficient documentation

## 2023-09-14 DIAGNOSIS — Z171 Estrogen receptor negative status [ER-]: Secondary | ICD-10-CM | POA: Diagnosis not present

## 2023-09-14 MED ORDER — HEPARIN SOD (PORK) LOCK FLUSH 100 UNIT/ML IV SOLN
500.0000 [IU] | Freq: Once | INTRAVENOUS | Status: AC
  Administered 2023-09-14: 500 [IU] via INTRAVENOUS
  Filled 2023-09-14: qty 5

## 2023-09-14 MED ORDER — SODIUM CHLORIDE 0.9% FLUSH
10.0000 mL | Freq: Once | INTRAVENOUS | Status: AC
  Administered 2023-09-14: 10 mL via INTRAVENOUS
  Filled 2023-09-14: qty 10

## 2023-09-16 ENCOUNTER — Other Ambulatory Visit: Payer: Self-pay

## 2023-09-17 ENCOUNTER — Encounter

## 2023-09-17 ENCOUNTER — Other Ambulatory Visit: Payer: Self-pay | Admitting: Oncology

## 2023-10-12 ENCOUNTER — Ambulatory Visit: Payer: 59 | Admitting: Nurse Practitioner

## 2023-10-12 ENCOUNTER — Encounter: Payer: Self-pay | Admitting: Nurse Practitioner

## 2023-10-12 VITALS — BP 128/70 | HR 70 | Temp 96.0°F | Resp 16 | Ht 61.0 in | Wt 170.8 lb

## 2023-10-12 DIAGNOSIS — I152 Hypertension secondary to endocrine disorders: Secondary | ICD-10-CM

## 2023-10-12 DIAGNOSIS — M150 Primary generalized (osteo)arthritis: Secondary | ICD-10-CM

## 2023-10-12 DIAGNOSIS — E1169 Type 2 diabetes mellitus with other specified complication: Secondary | ICD-10-CM | POA: Diagnosis not present

## 2023-10-12 DIAGNOSIS — E1159 Type 2 diabetes mellitus with other circulatory complications: Secondary | ICD-10-CM

## 2023-10-12 DIAGNOSIS — E1140 Type 2 diabetes mellitus with diabetic neuropathy, unspecified: Secondary | ICD-10-CM

## 2023-10-12 DIAGNOSIS — Z794 Long term (current) use of insulin: Secondary | ICD-10-CM

## 2023-10-12 DIAGNOSIS — Z0000 Encounter for general adult medical examination without abnormal findings: Secondary | ICD-10-CM | POA: Diagnosis not present

## 2023-10-12 DIAGNOSIS — E785 Hyperlipidemia, unspecified: Secondary | ICD-10-CM

## 2023-10-12 DIAGNOSIS — Z79899 Other long term (current) drug therapy: Secondary | ICD-10-CM

## 2023-10-12 LAB — POCT GLYCOSYLATED HEMOGLOBIN (HGB A1C): Hemoglobin A1C: 7.3 % — AB (ref 4.0–5.6)

## 2023-10-12 MED ORDER — AMLODIPINE BESYLATE 10 MG PO TABS: 10.0000 mg | ORAL_TABLET | Freq: Every day | ORAL | 0 refills | Status: DC

## 2023-10-12 MED ORDER — GABAPENTIN 300 MG PO CAPS: ORAL_CAPSULE | ORAL | 5 refills | Status: DC

## 2023-10-12 MED ORDER — ROSUVASTATIN CALCIUM 10 MG PO TABS: 10.0000 mg | ORAL_TABLET | Freq: Every day | ORAL | 1 refills | Status: DC

## 2023-10-12 MED ORDER — COLCHICINE 0.6 MG PO TABS: ORAL_TABLET | ORAL | 2 refills | Status: DC

## 2023-10-12 MED ORDER — CELECOXIB 200 MG PO CAPS: ORAL_CAPSULE | ORAL | 1 refills | Status: DC

## 2023-10-12 NOTE — Progress Notes (Signed)
 [facility] [address] [place], [place] [address]  Internal MEDICINE  Office Visit Note  Patient Name: Dawn Alvarez  [id number]  [id number]  Date of Service: 10/12/2023  Chief Complaint  Patient presents with   Diabetes   Hypertension   Hyperlipidemia   Medicare Wellness    HPI Rana presents for an annual well visit and physical exam.  Well-appearing 68 y.o. female with hypertension, COPD, diabetes, neuropathy, hyperlipidemia and a history of breast cancer.  Routine CRC screening: due in 2027 Routine mammogram: overdue now  DEXA scan: done in 2023  Eye exam: has appt scheduled in August but has had blurry vision for the psat 3 weeks.  Labs: due for routine labs  New or worsening pain: none  Other concerns: none     10/12/2023   10:07 AM 10/09/2022    9:57 AM 10/03/2021   10:14 AM  MMSE - Mini Mental State Exam  Orientation to time 0 5 5  Orientation to Place 5 5 5   Registration 3 3 3   Attention/ Calculation 0 5 5  Recall 3 3 3   Language- name 2 objects 2 2 2   Language- repeat 0 1 1  Language- follow 3 step command 3 3 3   Language- read & follow direction 1 1 1   Write a sentence 1 0 0  Copy design 1 1 1   Total score 19 29 29     Functional Status Survey: Is the patient deaf or have difficulty hearing?: No Does the patient have difficulty seeing, even when wearing glasses/contacts?: No Does the patient have difficulty concentrating, remembering, or making decisions?: No Does the patient have difficulty walking or climbing stairs?: No Does the patient have difficulty dressing or bathing?: No Does the patient have difficulty doing errands alone such as visiting a doctor's office or shopping?: No     02/24/2022   10:35 AM 04/08/2022   10:04 AM 07/10/2022   11:43 AM 10/09/2022    9:56 AM 10/12/2023   10:06 AM  Fall Risk  Falls in the past year? 0 0 1 0 0  Was there an injury with Fall? 0 0 1 0 0  Fall Risk Category Calculator 0 0 2 0 0  Fall Risk  Category (Retired) Low Low     (RETIRED) Patient Fall Risk Level  Low fall risk     Patient at Risk for Falls Due to History of fall(s);No Fall Risks No Fall Risks  No Fall Risks No Fall Risks  Fall risk Follow up Falls evaluation completed Falls evaluation completed Falls evaluation completed Falls evaluation completed Falls evaluation completed       10/12/2023   10:06 AM  Depression screen PHQ 2/9  Decreased Interest 0  Down, Depressed, Hopeless 0  PHQ - 2 Score 0       Current Medication: Outpatient Encounter Medications as of 10/12/2023  Medication Sig   Accu-Chek Softclix Lancets lancets Use 1 lancet to check glucose once daily and as needed for diabetes   Alcohol Swabs (B-D SINGLE USE SWABS REGULAR) PADS Use as directed twice a day   allopurinol  (ZYLOPRIM ) 100 MG tablet Take 1 tablet (100 mg total) by mouth at bedtime. Take 1 tablet by mouth at night for Gout   bisoprolol  (ZEBETA ) 5 MG tablet Take 1 tablet (5 mg total) by mouth daily.   Blood Glucose Monitoring Suppl (ACCU-CHEK GUIDE ME) w/Device KIT Use as directed. E11.65   Blood Glucose Monitoring Suppl (TRUE METRIX AIR GLUCOSE METER) w/Device  KIT 1 Device by Does not apply route in the morning and at bedtime. Use ad directed e11.65   brimonidine  (ALPHAGAN ) 0.2 % ophthalmic solution INSTILL 1 DROP INTO BOTH EYES  TWICE DAILY   Calcium  Carb-Cholecalciferol  (CALCIUM  600 + D) 600-200 MG-UNIT TABS Take 1 tablet by mouth daily.   glucose blood (ACCU-CHEK GUIDE TEST) test strip Use 1 test strip to check glucose 3 times daily as needed for diabetes E11.65   insulin  detemir (LEVEMIR  FLEXPEN) 100 UNIT/ML FlexPen Inject 34 Units into the skin daily.   Insulin  Pen Needle (PEN NEEDLES) 31G X 8 MM MISC Use as directed with insulin  E11.65   lidocaine -prilocaine  (EMLA ) cream Apply topically daily as needed. Apply small amount to port and cover with saran wrap 1-2 hours prior to port access   lisinopril  (ZESTRIL ) 40 MG tablet Take 1 tablet  (40 mg total) by mouth daily.   nicotine  polacrilex ([employer] NICOTINE ) 2 MG gum Take 1 each (2 mg total) by mouth as needed for smoking cessation.   Omega-3 Fatty Acids (FISH OIL) 1000 MG CAPS Take 1,000 mg by mouth daily.    potassium chloride  SA (KLOR-CON  M) 20 MEQ tablet Take 1 tablet (20 mEq total) by mouth daily.   Semaglutide , 1 MG/DOSE, (OZEMPIC , 1 MG/DOSE,) 2 MG/1.5ML SOPN Inject 1 mg into the skin once a week. FOR DM   SIMBRINZA 1-0.2 % SUSP    [DISCONTINUED] amLODipine  (NORVASC ) 10 MG tablet Take 1 tablet (10 mg total) by mouth daily.   [DISCONTINUED] celecoxib  (CELEBREX ) 200 MG capsule TAKE 1 CAPSULE BY MOUTH DAILY  FOR 1 WEEK AND THEN AS NEEDED   [DISCONTINUED] colchicine  0.6 MG tablet TAKE FOR A GOUT FLARE/ATTACK:  TAKE 2 TABLETS BY MOUTH THEN  TAKE 1 MORE TABLET 1 HOUR LATER  ON DAY 1, THEN TAKE 1 TABLET BY  MOUTH DAILY UNTIL IT RESOLVES   [DISCONTINUED] gabapentin  (NEURONTIN ) 300 MG capsule Take 2 capsule by mouth in the morning, 1 capsule by mouth at lunch/midday, and 3 capsule by mouth at bedtime.   [DISCONTINUED] rosuvastatin  (CRESTOR ) 10 MG tablet Take 1 tablet (10 mg total) by mouth daily.   amLODipine  (NORVASC ) 10 MG tablet Take 1 tablet (10 mg total) by mouth daily.   celecoxib  (CELEBREX ) 200 MG capsule TAKE 1 CAPSULE BY MOUTH DAILY  FOR 1 WEEK AND THEN AS NEEDED   colchicine  0.6 MG tablet TAKE FOR A GOUT FLARE/ATTACK:  TAKE 2 TABLETS BY MOUTH THEN  TAKE 1 MORE TABLET 1 HOUR LATER  ON DAY 1, THEN TAKE 1 TABLET BY  MOUTH DAILY UNTIL IT RESOLVES   gabapentin  (NEURONTIN ) 300 MG capsule Take 2 capsule by mouth in the morning, 1 capsule by mouth at lunch/midday, and 3 capsule by mouth at bedtime.   rosuvastatin  (CRESTOR ) 10 MG tablet Take 1 tablet (10 mg total) by mouth daily.   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   BREAST LUMPECTOMY WITH RADIOACTIVE SEED AND SENTINEL  LYMPH NODE BIOPSY Right 05/04/2020   Procedure: RIGHT BREAST LUMPECTOMY WITH BRACKETED RADIOACTIVE SEED AND SENTINEL LYMPH NODE BIOPSY;  Surgeon: Caralyn Chandler, MD;  Location: [facility];  Service: General;  Laterality: Right;   CESAREAN SECTION     COLONOSCOPY WITH PROPOFOL  N/A 09/11/2015   Procedure: COLONOSCOPY WITH PROPOFOL ;  Surgeon: Marnee Sink, MD;  Location: [facility];  Service: Endoscopy;  Laterality: N/A;   IR IMAGING GUIDED PORT INSERTION  07/20/2019    Medical History: Past Medical History:  Diagnosis Date   Asthma    Cancer (HCC) 03/2020   right breast IMC   COPD exacerbation (HCC) 04/12/2016   Diabetes mellitus without complication (HCC)    Hyperlipemia    Hypertension    Personal history of chemotherapy 2021    Family History: Family History  Problem Relation Age of Onset   Diabetes Mother    Hypertension Mother    Hypertension Father    Diabetes Father    Breast cancer Neg Hx     Social History   Socioeconomic History   Marital status: Married    Spouse name: Not on file   Number of children: Not on file   Years of education: Not on file   Highest education level: Not on file  Occupational History   Not on file  Tobacco Use   Smoking status: Former    Current packs/day: 0.00    Average packs/day: 1 pack/day for 2.0 years (2.0 ttl pk-yrs)    Types: Cigarettes    Start date: 02/28/2018    Quit date: 02/29/2020    Years since quitting: 3.6   Smokeless tobacco: Never   Tobacco comments:    Quit smoking  last month  Substance and Sexual Activity   Alcohol use: Not Currently    Comment: social   Drug use: No   Sexual activity: Not Currently    Birth control/protection: Post-menopausal  Other Topics Concern   Not on file  Social History Narrative   Not on file   Social Drivers of Health   Financial Resource Strain: Low Risk  (10/24/2020)   Overall Financial Resource Strain (CARDIA)    Difficulty of Paying Living Expenses: Not very hard  Food Insecurity: Not on file  Transportation Needs: Not on file  Physical Activity: Not on file  Stress: Not on file  Social Connections: Not on file  Intimate Partner Violence: Not on file      Review of Systems  Constitutional:  Negative for activity change, appetite change, chills, fatigue, fever and unexpected weight change.  HENT: Negative.  Negative for congestion, ear pain, rhinorrhea, sore throat and trouble swallowing.   Eyes: Negative.   Respiratory: Negative.  Negative for cough, chest tightness, shortness of breath and wheezing.   Cardiovascular: Negative.  Negative for chest pain.  Gastrointestinal: Negative.  Negative for abdominal pain, blood in stool, constipation, diarrhea, nausea and vomiting.  Endocrine: Negative.   Genitourinary: Negative.  Negative for difficulty urinating, dysuria, frequency, hematuria and urgency.  Musculoskeletal: Negative.  Negative for arthralgias, back pain, joint swelling, myalgias and neck pain.  Skin: Negative.  Negative for rash and wound.  Allergic/Immunologic: Negative.  Negative for immunocompromised state.  Neurological: Negative.  Negative for dizziness, seizures, numbness and headaches.  Hematological: Negative.   Psychiatric/Behavioral: Negative.  Negative for behavioral problems, self-injury and suicidal ideas. The patient is not nervous/anxious.     Vital Signs: BP 128/70   Pulse 70   Temp (!) 96 F (35.6  C)   Resp 16   Ht 5\' 1"  (1.549 m)   Wt 170 lb 12.8 oz (77.5 kg)   SpO2 98%    BMI 32.27 kg/m    Physical Exam Vitals reviewed.  Constitutional:      General: She is awake. She is not in acute distress.    Appearance: Normal appearance. She is well-developed and well-groomed. She is obese. She is not ill-appearing or diaphoretic.  HENT:     Head: Normocephalic and atraumatic.     Right Ear: Tympanic membrane, ear canal and external ear normal.     Left Ear: Tympanic membrane, ear canal and external ear normal.     Nose: Nose normal. No congestion or rhinorrhea.     Mouth/Throat:     Lips: Pink.     Mouth: Mucous membranes are moist.     Pharynx: Oropharynx is clear. Uvula midline. No oropharyngeal exudate or posterior oropharyngeal erythema.  Eyes:     General: Lids are normal. Vision grossly intact. Gaze aligned appropriately. No scleral icterus.       Right eye: No discharge.        Left eye: No discharge.     Extraocular Movements: Extraocular movements intact.     Conjunctiva/sclera: Conjunctivae normal.     Pupils: Pupils are equal, round, and reactive to light.     Funduscopic exam:    Right eye: Red reflex present.        Left eye: Red reflex present. Neck:     Thyroid : No thyromegaly.     Vascular: No JVD.     Trachea: Trachea and phonation normal. No tracheal deviation.  Cardiovascular:     Rate and Rhythm: Normal rate and regular rhythm.     Pulses:          Dorsalis pedis pulses are 3+ on the right side and 3+ on the left side.       Posterior tibial pulses are 3+ on the right side and 3+ on the left side.     Heart sounds: Normal heart sounds, S1 normal and S2 normal. No murmur heard.    No friction rub. No gallop.  Pulmonary:     Effort: Pulmonary effort is normal. No accessory muscle usage or respiratory distress.     Breath sounds: Normal breath sounds and air entry. No stridor. No decreased breath sounds, wheezing or rales.  Chest:     Chest wall: No tenderness.     Comments: Declined clinical breast exam, mammogram scheduled in  june Abdominal:     General: Bowel sounds are normal. There is no distension.     Palpations: Abdomen is soft. There is no shifting dullness, fluid wave, mass or pulsatile mass.     Tenderness: There is no abdominal tenderness. There is no guarding or rebound.  Musculoskeletal:        General: No tenderness or deformity. Normal range of motion.     Cervical back: Normal range of motion and neck supple.     Right lower leg: No edema.     Left lower leg: No edema.     Right foot: Normal range of motion.     Left foot: Normal range of motion.  Feet:     Right foot:     Protective Sensation: 6 sites tested.  4 sites sensed.     Skin integrity: Callus and dry skin present.     Toenail Condition: Right toenails are abnormally thick, long and ingrown.  Left foot:     Protective Sensation: 6 sites tested.  4 sites sensed.     Skin integrity: Callus and dry skin present.     Toenail Condition: Left toenails are abnormally thick, long and ingrown.  Lymphadenopathy:     Cervical: No cervical adenopathy.  Skin:    General: Skin is warm and dry.     Capillary Refill: Capillary refill takes less than 2 seconds.     Coloration: Skin is not pale.     Findings: No erythema or rash.  Neurological:     Mental Status: She is alert and oriented to person, place, and time.     Cranial Nerves: No cranial nerve deficit.     Motor: No abnormal muscle tone.     Coordination: Coordination normal.     Gait: Gait normal.     Deep Tendon Reflexes: Reflexes are normal and symmetric.  Psychiatric:        Mood and Affect: Mood normal.        Behavior: Behavior normal. Behavior is cooperative.        Thought Content: Thought content normal.        Judgment: Judgment normal.        Assessment/Plan: 1. Encounter for subsequent annual wellness visit (AWV) in Medicare patient (Primary) Age-appropriate preventive screenings and vaccinations discussed. Routine labs for health maintenance will be ordered.  PHM updated.    2. Type 2 diabetes mellitus with diabetic neuropathy, with long-term current use of insulin  (HCC) A1c is elevated at 7.3 today which is increased from January. Continue gabapentin  for diabetic neuropathy.  - POCT glycosylated hemoglobin (Hb A1C) - gabapentin  (NEURONTIN ) 300 MG capsule; Take 2 capsule by mouth in the morning, 1 capsule by mouth at lunch/midday, and 3 capsule by mouth at bedtime.  Dispense: 180 capsule; Refill: 5  3. Hyperlipidemia associated with type 2 diabetes mellitus (HCC) Continue rosuvastatin  as prescribed  - rosuvastatin  (CRESTOR ) 10 MG tablet; Take 1 tablet (10 mg total) by mouth daily.  Dispense: 90 tablet; Refill: 1  4. Hypertension associated with diabetes (HCC) Stable, continue amlodipine  as prescribed.  - amLODipine  (NORVASC ) 10 MG tablet; Take 1 tablet (10 mg total) by mouth daily.  Dispense: 90 tablet; Refill: 0  5. Primary osteoarthritis involving multiple joints Continue celebrex  as prescribed.  - celecoxib  (CELEBREX ) 200 MG capsule; TAKE 1 CAPSULE BY MOUTH DAILY  FOR 1 WEEK AND THEN AS NEEDED  Dispense: 100 capsule; Refill: 1  6. Encounter for medication review Medication list reviewed, updated and refills ordered  - colchicine  0.6 MG tablet; TAKE FOR A GOUT FLARE/ATTACK:  TAKE 2 TABLETS BY MOUTH THEN  TAKE 1 MORE TABLET 1 HOUR LATER  ON DAY 1, THEN TAKE 1 TABLET BY  MOUTH DAILY UNTIL IT RESOLVES  Dispense: 100 tablet; Refill: 2      General Counseling: Odalis verbalizes understanding of the findings of todays visit and agrees with plan of treatment. I have discussed any further diagnostic evaluation that may be needed or ordered today. We also reviewed her medications today. she has been encouraged to call the office with any questions or concerns that should arise related to todays visit.    Orders Placed This Encounter  Procedures   POCT glycosylated hemoglobin (Hb A1C)    Meds ordered this encounter  Medications   amLODipine   (NORVASC ) 10 MG tablet    Sig: Take 1 tablet (10 mg total) by mouth daily.    Dispense:  90 tablet  Refill:  0    Please note change in dose, patient is taking 1 whole tablet now instead of a half tablet. Please discontinue all previous amlodipine  orders   celecoxib  (CELEBREX ) 200 MG capsule    Sig: TAKE 1 CAPSULE BY MOUTH DAILY  FOR 1 WEEK AND THEN AS NEEDED    Dispense:  100 capsule    Refill:  1    Please send a replace/new response with 100-Day Supply if appropriate to maximize member benefit. Requesting 1 year supply.   gabapentin  (NEURONTIN ) 300 MG capsule    Sig: Take 2 capsule by mouth in the morning, 1 capsule by mouth at lunch/midday, and 3 capsule by mouth at bedtime.    Dispense:  180 capsule    Refill:  5    Note change in directions, dose and number of capsule   colchicine  0.6 MG tablet    Sig: TAKE FOR A GOUT FLARE/ATTACK:  TAKE 2 TABLETS BY MOUTH THEN  TAKE 1 MORE TABLET 1 HOUR LATER  ON DAY 1, THEN TAKE 1 TABLET BY  MOUTH DAILY UNTIL IT RESOLVES    Dispense:  100 tablet    Refill:  2    Please send a replace/new response with 100-Day Supply if appropriate to maximize member benefit. Requesting 1 year supply.   rosuvastatin  (CRESTOR ) 10 MG tablet    Sig: Take 1 tablet (10 mg total) by mouth daily.    Dispense:  90 tablet    Refill:  1    Return in about 4 months (around 02/12/2024) for F/U, Recheck A1C, Janie Capp PCP.   Total time spent:30 Minutes Time spent includes review of chart, medications, test results, and follow up plan with the patient.   [place] Controlled Substance Database was reviewed by me.  This patient was seen by Laurence Pons, FNP-C in collaboration with Dr. Verneta Gone as a part of collaborative care agreement.  Journey Ratterman R. Bobbi Burow, MSN, FNP-C Internal medicine

## 2023-10-14 ENCOUNTER — Other Ambulatory Visit: Payer: Self-pay | Admitting: Nurse Practitioner

## 2023-10-15 LAB — T4, FREE: Free T4: 1.34 ng/dL (ref 0.82–1.77)

## 2023-10-15 LAB — LIPID PANEL
Chol/HDL Ratio: 3.3 ratio (ref 0.0–4.4)
Cholesterol, Total: 173 mg/dL (ref 100–199)
HDL: 53 mg/dL (ref >39)
LDL Chol Calc (NIH): 86 mg/dL (ref 0–99)
Triglycerides: 200 mg/dL — ABNORMAL HIGH (ref 0–149)
VLDL Cholesterol Cal: 34 mg/dL (ref 5–40)

## 2023-10-15 LAB — B12 AND FOLATE PANEL
Folate: 18.1 ng/mL (ref >3.0)
Vitamin B-12: 575 pg/mL (ref 232–1245)

## 2023-10-15 LAB — VITAMIN D 25 HYDROXY (VIT D DEFICIENCY, FRACTURES): Vit D, 25-Hydroxy: 18.6 ng/mL — ABNORMAL LOW (ref 30.0–100.0)

## 2023-10-15 LAB — TSH: TSH: 1.19 u[IU]/mL (ref 0.450–4.500)

## 2023-10-19 ENCOUNTER — Encounter: Payer: Self-pay | Admitting: Nurse Practitioner

## 2023-11-09 ENCOUNTER — Inpatient Hospital Stay: Payer: 59 | Attending: Oncology

## 2023-11-21 ENCOUNTER — Other Ambulatory Visit: Payer: Self-pay

## 2023-11-21 ENCOUNTER — Emergency Department

## 2023-11-21 ENCOUNTER — Inpatient Hospital Stay
Admission: EM | Admit: 2023-11-21 | Discharge: 2023-11-24 | DRG: 192 | Disposition: A | Discharge status: Home / Self Care | Attending: Internal Medicine | Admitting: Internal Medicine

## 2023-11-21 DIAGNOSIS — Z86000 Personal history of in-situ neoplasm of breast: Secondary | ICD-10-CM

## 2023-11-21 DIAGNOSIS — Z853 Personal history of malignant neoplasm of breast: Secondary | ICD-10-CM

## 2023-11-21 DIAGNOSIS — Z9221 Personal history of antineoplastic chemotherapy: Secondary | ICD-10-CM

## 2023-11-21 DIAGNOSIS — E876 Hypokalemia: Secondary | ICD-10-CM | POA: Diagnosis present

## 2023-11-21 DIAGNOSIS — Z794 Long term (current) use of insulin: Secondary | ICD-10-CM

## 2023-11-21 DIAGNOSIS — J441 Chronic obstructive pulmonary disease with (acute) exacerbation: Principal | ICD-10-CM | POA: Diagnosis present

## 2023-11-21 DIAGNOSIS — J449 Chronic obstructive pulmonary disease, unspecified: Secondary | ICD-10-CM | POA: Diagnosis present

## 2023-11-21 DIAGNOSIS — E785 Hyperlipidemia, unspecified: Secondary | ICD-10-CM | POA: Diagnosis present

## 2023-11-21 DIAGNOSIS — T380X5A Adverse effect of glucocorticoids and synthetic analogues, initial encounter: Secondary | ICD-10-CM | POA: Diagnosis present

## 2023-11-21 DIAGNOSIS — Z833 Family history of diabetes mellitus: Secondary | ICD-10-CM

## 2023-11-21 DIAGNOSIS — C50411 Malignant neoplasm of upper-outer quadrant of right female breast: Secondary | ICD-10-CM | POA: Diagnosis present

## 2023-11-21 DIAGNOSIS — R0902 Hypoxemia: Secondary | ICD-10-CM | POA: Diagnosis present

## 2023-11-21 DIAGNOSIS — Z8249 Family history of ischemic heart disease and other diseases of the circulatory system: Secondary | ICD-10-CM

## 2023-11-21 DIAGNOSIS — E1165 Type 2 diabetes mellitus with hyperglycemia: Secondary | ICD-10-CM | POA: Diagnosis present

## 2023-11-21 DIAGNOSIS — E119 Type 2 diabetes mellitus without complications: Secondary | ICD-10-CM

## 2023-11-21 DIAGNOSIS — D72829 Elevated white blood cell count, unspecified: Secondary | ICD-10-CM | POA: Diagnosis present

## 2023-11-21 DIAGNOSIS — I10 Essential (primary) hypertension: Secondary | ICD-10-CM | POA: Diagnosis present

## 2023-11-21 DIAGNOSIS — Z79899 Other long term (current) drug therapy: Secondary | ICD-10-CM

## 2023-11-21 DIAGNOSIS — Z87891 Personal history of nicotine dependence: Secondary | ICD-10-CM

## 2023-11-21 LAB — CBC WITH DIFFERENTIAL/PLATELET
Abs Immature Granulocytes: 0.01 10*3/uL (ref 0.00–0.07)
Basophils Absolute: 0 10*3/uL (ref 0.0–0.1)
Basophils Relative: 1 %
Eosinophils Absolute: 0.2 10*3/uL (ref 0.0–0.5)
Eosinophils Relative: 2 %
HCT: 35.6 % — ABNORMAL LOW (ref 36.0–46.0)
Hemoglobin: 12.1 g/dL (ref 12.0–15.0)
Immature Granulocytes: 0 %
Lymphocytes Relative: 35 %
Lymphs Abs: 2.3 10*3/uL (ref 0.7–4.0)
MCH: 29.7 pg (ref 26.0–34.0)
MCHC: 34 g/dL (ref 30.0–36.0)
MCV: 87.3 fL (ref 80.0–100.0)
Monocytes Absolute: 0.9 10*3/uL (ref 0.1–1.0)
Monocytes Relative: 14 %
Neutro Abs: 3.1 10*3/uL (ref 1.7–7.7)
Neutrophils Relative %: 48 %
Platelets: 180 10*3/uL (ref 150–400)
RBC: 4.08 MIL/uL (ref 3.87–5.11)
RDW: 13.6 % (ref 11.5–15.5)
WBC: 6.5 10*3/uL (ref 4.0–10.5)
nRBC: 0 % (ref 0.0–0.2)

## 2023-11-21 LAB — BASIC METABOLIC PANEL WITH GFR
Anion gap: 10 (ref 5–15)
BUN: 17 mg/dL (ref 8–23)
CO2: 22 mmol/L (ref 22–32)
Calcium: 8.7 mg/dL — ABNORMAL LOW (ref 8.9–10.3)
Chloride: 107 mmol/L (ref 98–111)
Creatinine, Ser: 0.72 mg/dL (ref 0.44–1.00)
GFR, Estimated: >60 mL/min (ref >60)
Glucose, Bld: 135 mg/dL — ABNORMAL HIGH (ref 70–99)
Potassium: 3.4 mmol/L — ABNORMAL LOW (ref 3.5–5.1)
Sodium: 139 mmol/L (ref 135–145)

## 2023-11-21 LAB — TROPONIN I (HIGH SENSITIVITY): Troponin I (High Sensitivity): 9 ng/L (ref <18)

## 2023-11-21 MED ORDER — IPRATROPIUM-ALBUTEROL 0.5-2.5 (3) MG/3ML IN SOLN
3.0000 mL | Freq: Once | RESPIRATORY_TRACT | Status: AC
  Administered 2023-11-21: 3 mL via RESPIRATORY_TRACT
  Filled 2023-11-21: qty 3

## 2023-11-21 NOTE — ED Triage Notes (Addendum)
 PT arrives via EMS from home with c/o [facility] that started either yesterday or earlier today. Gradually getting worse today as the day progressed. Audible expiratory wheezing in triage. Received 2 Duoneb treatments and 125 mg Solu-Medrol en route. PMH: HTN and Breast CA (currently being treated). Reports having substernal chest pain at this time. Rates the pain 7/10.

## 2023-11-21 NOTE — ED Provider Notes (Signed)
 [facility] Provider Note    Event Date/Time   First MD Initiated Contact with Patient 11/21/23 2157     (approximate)   History   Shortness of Breath   HPI  Dawn Alvarez is a 68 y.o. female who presents to the emergency department via EMS for shortness of breath.  Patient states that the shortness of breath started earlier today and gradually got worse.  She walked to a friend's house when he became more significant.  She did have some chest discomfort with this and a nonproductive cough.  Patient denies any fevers. Was given solumedrol by EMS.     Physical Exam   Triage Vital Signs: ED Triage Vitals  Encounter Vitals Group     BP 11/21/23 2157 (!) 176/77     Girls Systolic BP Percentile --      Girls Diastolic BP Percentile --      Boys Systolic BP Percentile --      Boys Diastolic BP Percentile --      Pulse Rate 11/21/23 2157 68     Resp --      Temp 11/21/23 2157 98.4 F (36.9 C)     Temp Source 11/21/23 2157 Oral     SpO2 11/21/23 2157 100 %     Weight 11/21/23 2200 175 lb (79.4 kg)     Height 11/21/23 2200 5' 1 (1.549 m)     Head Circumference --      Peak Flow --      Pain Score 11/21/23 2158 7     Pain Loc --      Pain Education --      Exclude from Growth Chart --     Most recent vital signs: Vitals:   11/21/23 2157  BP: (!) 176/77  Pulse: 68  Temp: 98.4 F (36.9 C)  SpO2: 100%   General: Awake, alert. CV:  Good peripheral perfusion. Regular rate and rhythm. Resp:  Increased work of breathing, diffuse expiratory wheezing. Abd:  No distention.  Other:  No lower extremity edema.    ED Results / Procedures / Treatments   Labs (all labs ordered are listed, but only abnormal results are displayed) Labs Reviewed - No data to display   EKG  I, Guadalupe Eagles, attending physician, personally viewed and interpreted this EKG  EKG Time: 2158 Rate: 69 Rhythm: sinus rhythm Axis: normal Intervals: qtc 428 QRS:  narrow ST changes: no st elevation Impression: normal ekg   RADIOLOGY I independently interpreted and visualized the CXR. My interpretation: bilateral opacities Radiology interpretation:  IMPRESSION:  No active disease.      PROCEDURES:  Critical Care performed: No    MEDICATIONS ORDERED IN ED: Medications - No data to display   IMPRESSION / MDM / ASSESSMENT AND PLAN / ED COURSE  I reviewed the triage vital signs and the nursing notes.                              Differential diagnosis includes, but is not limited to, COPD, pneumonia, ACS, viral illness  Patient's presentation is most consistent with acute presentation with potential threat to life or bodily function.   The patient is on the cardiac monitor to evaluate for evidence of arrhythmia and/or significant heart rate changes.  Patient presented to the emergency department today via EMS because of concerns for shortness of breath.  On exam patient does have diffuse expiratory wheezing.  Does have slightly increased work of breathing.  Patient was placed on monitor.  Chest x-ray did not show any pneumonia.  Blood work without concerning anemia or electrolyte abnormality or troponin elevation.  Patient was given further DuoNeb treatments here and did show some improvement. Patient did state she felt better. Will trial ambulation. If patient feels improved and comfortable do think it would be reasonable for patient to be discharge, however if still short of breath would plan on bringing in for COPD exacerbation.       FINAL CLINICAL IMPRESSION(S) / ED DIAGNOSES   Final diagnoses:  COPD exacerbation (HCC)       Note:  This document was prepared using Dragon voice recognition software and may include unintentional dictation errors.    Floy Roberts, MD 11/22/23 0001

## 2023-11-22 DIAGNOSIS — Z87891 Personal history of nicotine dependence: Secondary | ICD-10-CM | POA: Diagnosis not present

## 2023-11-22 DIAGNOSIS — J449 Chronic obstructive pulmonary disease, unspecified: Secondary | ICD-10-CM | POA: Diagnosis present

## 2023-11-22 DIAGNOSIS — Z9221 Personal history of antineoplastic chemotherapy: Secondary | ICD-10-CM | POA: Diagnosis not present

## 2023-11-22 DIAGNOSIS — Z794 Long term (current) use of insulin: Secondary | ICD-10-CM | POA: Diagnosis not present

## 2023-11-22 DIAGNOSIS — J441 Chronic obstructive pulmonary disease with (acute) exacerbation: Secondary | ICD-10-CM | POA: Diagnosis present

## 2023-11-22 DIAGNOSIS — E876 Hypokalemia: Secondary | ICD-10-CM | POA: Diagnosis present

## 2023-11-22 DIAGNOSIS — E119 Type 2 diabetes mellitus without complications: Secondary | ICD-10-CM | POA: Diagnosis not present

## 2023-11-22 DIAGNOSIS — I10 Essential (primary) hypertension: Secondary | ICD-10-CM | POA: Diagnosis present

## 2023-11-22 DIAGNOSIS — Z853 Personal history of malignant neoplasm of breast: Secondary | ICD-10-CM | POA: Diagnosis not present

## 2023-11-22 DIAGNOSIS — R0902 Hypoxemia: Secondary | ICD-10-CM | POA: Diagnosis present

## 2023-11-22 DIAGNOSIS — Z86000 Personal history of in-situ neoplasm of breast: Secondary | ICD-10-CM | POA: Diagnosis not present

## 2023-11-22 DIAGNOSIS — Z79899 Other long term (current) drug therapy: Secondary | ICD-10-CM | POA: Diagnosis not present

## 2023-11-22 DIAGNOSIS — T380X5A Adverse effect of glucocorticoids and synthetic analogues, initial encounter: Secondary | ICD-10-CM | POA: Diagnosis present

## 2023-11-22 DIAGNOSIS — E785 Hyperlipidemia, unspecified: Secondary | ICD-10-CM | POA: Diagnosis present

## 2023-11-22 DIAGNOSIS — Z8249 Family history of ischemic heart disease and other diseases of the circulatory system: Secondary | ICD-10-CM | POA: Diagnosis not present

## 2023-11-22 DIAGNOSIS — E1165 Type 2 diabetes mellitus with hyperglycemia: Secondary | ICD-10-CM | POA: Diagnosis present

## 2023-11-22 DIAGNOSIS — Z833 Family history of diabetes mellitus: Secondary | ICD-10-CM | POA: Diagnosis not present

## 2023-11-22 DIAGNOSIS — D72829 Elevated white blood cell count, unspecified: Secondary | ICD-10-CM | POA: Diagnosis present

## 2023-11-22 DIAGNOSIS — C50411 Malignant neoplasm of upper-outer quadrant of right female breast: Secondary | ICD-10-CM | POA: Diagnosis not present

## 2023-11-22 LAB — GLUCOSE, CAPILLARY
Glucose-Capillary: 253 mg/dL — ABNORMAL HIGH (ref 70–99)
Glucose-Capillary: 311 mg/dL — ABNORMAL HIGH (ref 70–99)
Glucose-Capillary: 301 mg/dL — ABNORMAL HIGH (ref 70–99)
Glucose-Capillary: 290 mg/dL — ABNORMAL HIGH (ref 70–99)

## 2023-11-22 LAB — TROPONIN I (HIGH SENSITIVITY): Troponin I (High Sensitivity): 8 ng/L (ref <18)

## 2023-11-22 MED ORDER — ROSUVASTATIN CALCIUM 10 MG PO TABS
10.0000 mg | ORAL_TABLET | Freq: Every day | ORAL | Status: DC
  Administered 2023-11-22 – 2023-11-24 (×3): 10 mg via ORAL
  Filled 2023-11-22 (×3): qty 1

## 2023-11-22 MED ORDER — GUAIFENESIN ER 600 MG PO TB12
600.0000 mg | ORAL_TABLET | Freq: Two times a day (BID) | ORAL | Status: DC
  Administered 2023-11-22 – 2023-11-24 (×6): 600 mg via ORAL
  Filled 2023-11-22 (×6): qty 1

## 2023-11-22 MED ORDER — BISOPROLOL FUMARATE 5 MG PO TABS
5.0000 mg | ORAL_TABLET | Freq: Every day | ORAL | Status: DC
  Administered 2023-11-22 – 2023-11-24 (×3): 5 mg via ORAL
  Filled 2023-11-22 (×3): qty 1

## 2023-11-22 MED ORDER — AMLODIPINE BESYLATE 10 MG PO TABS
10.0000 mg | ORAL_TABLET | Freq: Every day | ORAL | Status: DC
  Administered 2023-11-22 – 2023-11-24 (×3): 10 mg via ORAL
  Filled 2023-11-22 (×3): qty 1

## 2023-11-22 MED ORDER — ENOXAPARIN SODIUM 40 MG/0.4ML IJ SOSY
0.5000 mg/kg | PREFILLED_SYRINGE | INTRAMUSCULAR | Status: DC
  Administered 2023-11-22 – 2023-11-24 (×3): 40 mg via SUBCUTANEOUS
  Filled 2023-11-22 (×3): qty 0.4

## 2023-11-22 MED ORDER — HYDROCODONE-ACETAMINOPHEN 5-325 MG PO TABS
1.0000 | ORAL_TABLET | ORAL | Status: DC | PRN
  Administered 2023-11-24: 1 via ORAL
  Filled 2023-11-22: qty 1

## 2023-11-22 MED ORDER — ACETAMINOPHEN 650 MG RE SUPP: 650.0000 mg | Freq: Four times a day (QID) | RECTAL | Status: DC | PRN

## 2023-11-22 MED ORDER — INSULIN ASPART 100 UNIT/ML IJ SOLN
0.0000 [IU] | Freq: Three times a day (TID) | INTRAMUSCULAR | Status: DC
  Administered 2023-11-22: 11 [IU] via SUBCUTANEOUS
  Administered 2023-11-22 (×2): 15 [IU] via SUBCUTANEOUS
  Administered 2023-11-23: 4 [IU] via SUBCUTANEOUS
  Administered 2023-11-23: 15 [IU] via SUBCUTANEOUS
  Administered 2023-11-23: 11 [IU] via SUBCUTANEOUS
  Administered 2023-11-24: 3 [IU] via SUBCUTANEOUS
  Filled 2023-11-22 (×7): qty 1

## 2023-11-22 MED ORDER — METHYLPREDNISOLONE SODIUM SUCC 40 MG IJ SOLR
40.0000 mg | Freq: Two times a day (BID) | INTRAMUSCULAR | Status: AC
  Administered 2023-11-22 (×2): 40 mg via INTRAVENOUS
  Filled 2023-11-22 (×2): qty 1

## 2023-11-22 MED ORDER — ONDANSETRON HCL 4 MG PO TABS
4.0000 mg | ORAL_TABLET | Freq: Four times a day (QID) | ORAL | Status: DC | PRN
Start: 2023-11-22 — End: 2023-11-24

## 2023-11-22 MED ORDER — ACETAMINOPHEN 325 MG PO TABS
650.0000 mg | ORAL_TABLET | Freq: Four times a day (QID) | ORAL | Status: DC | PRN
Start: 2023-11-22 — End: 2023-11-24
  Administered 2023-11-22: 650 mg via ORAL
  Filled 2023-11-22: qty 2

## 2023-11-22 MED ORDER — POTASSIUM CHLORIDE CRYS ER 20 MEQ PO TBCR
40.0000 meq | EXTENDED_RELEASE_TABLET | Freq: Once | ORAL | Status: AC
  Administered 2023-11-22: 40 meq via ORAL
  Filled 2023-11-22: qty 2

## 2023-11-22 MED ORDER — GABAPENTIN 300 MG PO CAPS
300.0000 mg | ORAL_CAPSULE | Freq: Two times a day (BID) | ORAL | Status: DC
  Administered 2023-11-22 – 2023-11-24 (×6): 300 mg via ORAL
  Filled 2023-11-22 (×6): qty 1

## 2023-11-22 MED ORDER — NICOTINE 14 MG/24HR TD PT24
14.0000 mg | MEDICATED_PATCH | Freq: Every day | TRANSDERMAL | Status: DC
  Administered 2023-11-22 – 2023-11-24 (×3): 14 mg via TRANSDERMAL
  Filled 2023-11-22 (×3): qty 1

## 2023-11-22 MED ORDER — SODIUM CHLORIDE 0.9 % IV BOLUS (SEPSIS)
1000.0000 mL | Freq: Once | INTRAVENOUS | Status: AC
  Administered 2023-11-22: 1000 mL via INTRAVENOUS

## 2023-11-22 MED ORDER — INSULIN ASPART 100 UNIT/ML IJ SOLN
0.0000 [IU] | Freq: Every day | INTRAMUSCULAR | Status: DC
  Administered 2023-11-22: 3 [IU] via SUBCUTANEOUS
  Administered 2023-11-23: 5 [IU] via SUBCUTANEOUS
  Filled 2023-11-22 (×2): qty 1

## 2023-11-22 MED ORDER — IPRATROPIUM-ALBUTEROL 0.5-2.5 (3) MG/3ML IN SOLN
3.0000 mL | Freq: Four times a day (QID) | RESPIRATORY_TRACT | Status: DC
  Administered 2023-11-22 – 2023-11-24 (×10): 3 mL via RESPIRATORY_TRACT
  Filled 2023-11-22 (×10): qty 3

## 2023-11-22 MED ORDER — ALBUTEROL SULFATE (2.5 MG/3ML) 0.083% IN NEBU
5.0000 mg | INHALATION_SOLUTION | Freq: Once | RESPIRATORY_TRACT | Status: AC
  Administered 2023-11-22: 5 mg via RESPIRATORY_TRACT
  Filled 2023-11-22: qty 6

## 2023-11-22 MED ORDER — LISINOPRIL 20 MG PO TABS
40.0000 mg | ORAL_TABLET | Freq: Every day | ORAL | Status: DC
  Administered 2023-11-22 – 2023-11-24 (×3): 40 mg via ORAL
  Filled 2023-11-22 (×3): qty 2

## 2023-11-22 MED ORDER — ONDANSETRON HCL 4 MG/2ML IJ SOLN: 4.0000 mg | Freq: Four times a day (QID) | INTRAMUSCULAR | Status: DC | PRN

## 2023-11-22 MED ORDER — ALBUTEROL SULFATE (2.5 MG/3ML) 0.083% IN NEBU
2.5000 mg | INHALATION_SOLUTION | RESPIRATORY_TRACT | Status: DC | PRN
Start: 2023-11-22 — End: 2023-11-24

## 2023-11-22 MED ORDER — PREDNISONE 20 MG PO TABS
40.0000 mg | ORAL_TABLET | Freq: Every day | ORAL | Status: DC
  Administered 2023-11-23 – 2023-11-24 (×2): 40 mg via ORAL
  Filled 2023-11-22 (×2): qty 2

## 2023-11-22 MED ORDER — BRIMONIDINE TARTRATE 0.2 % OP SOLN
1.0000 [drp] | Freq: Two times a day (BID) | OPHTHALMIC | Status: DC
  Administered 2023-11-22 – 2023-11-24 (×4): 1 [drp] via OPHTHALMIC
  Filled 2023-11-22 (×2): qty 5

## 2023-11-22 NOTE — ED Notes (Signed)
 O2 remained at 99% walking to and from the restroom. Increased dizziness walking back from the restroom

## 2023-11-22 NOTE — Assessment & Plan Note (Signed)
 No acute issues suspected

## 2023-11-22 NOTE — H&P (Signed)
 History and Physical    Patient: Dawn Alvarez FMW:[id number] DOB: September 12, 1955 DOA: 11/21/2023 DOS: the patient was seen and examined on 11/22/2023 PCP: Liana Fish, NP  Patient coming from: Home  Chief Complaint:  Chief Complaint  Patient presents with   Shortness of Breath    HPI: Dawn Alvarez is a 68 y.o. female with medical history significant for right-sided breast cancer , IDT2DM, HTN being admitted with a COPD exacerbation.  She started wheezing a day ago and it did not respond to her home bronchodilator treatment.In the ED BP 176/77 with otherwise normal vitals and O2 sat of 100% on room air.  CBC, BMP and troponin unremarkable.  EKG, showing sinus at 69 and chest x-ray nonacute.  Patient arrived by rescue who administered DuoNebs and Solu-Medrol en route.  She received additional treatments here in the ED but continued to have audible wheezing and increased work of breathing.  Admission requested     Review of Systems: As mentioned in the history of present illness. All other systems reviewed and are negative.  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   BREAST LUMPECTOMY WITH RADIOACTIVE SEED AND SENTINEL LYMPH NODE BIOPSY Right 05/04/2020   Procedure: RIGHT BREAST LUMPECTOMY WITH BRACKETED RADIOACTIVE SEED AND SENTINEL LYMPH NODE BIOPSY;  Surgeon: Curvin Deward MOULD, MD;   Location: [facility];  Service: General;  Laterality: Right;   CESAREAN SECTION     COLONOSCOPY WITH PROPOFOL  N/A 09/11/2015   Procedure: COLONOSCOPY WITH PROPOFOL ;  Surgeon: Rogelia Copping, MD;  Location: [facility];  Service: Endoscopy;  Laterality: N/A;   IR IMAGING GUIDED PORT INSERTION  07/20/2019   Social History:  reports that she quit smoking about 3 years ago. Her smoking use included cigarettes. She started smoking about 5 years ago. She has a 2 pack-year smoking history. She has never used smokeless tobacco. She reports that she does not currently use alcohol. She reports that she does not use drugs.  No Known Allergies  Family History  Problem Relation Age of Onset   Diabetes Mother    Hypertension Mother    Hypertension Father    Diabetes Father    Breast cancer Neg Hx     Prior to Admission medications   Medication Sig Start Date End Date Taking? Authorizing Provider  allopurinol  (ZYLOPRIM ) 100 MG tablet Take 1 tablet (100 mg total) by mouth at bedtime. Take 1 tablet by mouth at night for Gout 02/18/23  Yes Abernathy, Alyssa, NP  amLODipine  (NORVASC ) 10 MG tablet Take 1 tablet (10 mg total) by mouth daily. 10/12/23  Yes Abernathy, Fish, NP  bisoprolol  (ZEBETA ) 5 MG tablet Take 1 tablet (5 mg total) by mouth daily. 07/16/23  Yes Abernathy, Fish, NP  brimonidine  (ALPHAGAN ) 0.2 % ophthalmic solution INSTILL 1 DROP INTO BOTH EYES  TWICE DAILY 02/18/23  Yes Abernathy,  Alyssa, NP  Calcium  Carb-Cholecalciferol  (CALCIUM  600 + D) 600-200 MG-UNIT TABS Take 1 tablet by mouth daily.   Yes [provider]  celecoxib  (CELEBREX ) 200 MG capsule TAKE 1 CAPSULE BY MOUTH DAILY  FOR 1 WEEK AND THEN AS NEEDED 10/12/23  Yes Abernathy, Alyssa, NP  colchicine  0.6 MG tablet TAKE FOR A GOUT FLARE/ATTACK:  TAKE 2 TABLETS BY MOUTH THEN  TAKE 1 MORE TABLET 1 HOUR LATER  ON DAY 1, THEN TAKE 1 TABLET BY  MOUTH DAILY UNTIL IT RESOLVES 10/12/23  Yes Abernathy, Alyssa, NP  gabapentin   (NEURONTIN ) 300 MG capsule Take 2 capsule by mouth in the morning, 1 capsule by mouth at lunch/midday, and 3 capsule by mouth at bedtime. 10/12/23  Yes Abernathy, Mardy, NP  lidocaine -prilocaine  (EMLA ) cream Apply topically daily as needed. Apply small amount to port and cover with saran wrap 1-2 hours prior to port access 09/17/23  Yes Babara Call, MD  lisinopril  (ZESTRIL ) 40 MG tablet Take 1 tablet (40 mg total) by mouth daily. 02/18/23  Yes Abernathy, Mardy, NP  nicotine  polacrilex ([employer] NICOTINE ) 2 MG gum Take 1 each (2 mg total) by mouth as needed for smoking cessation. 10/03/21  Yes Abernathy, Mardy, NP  Omega-3 Fatty Acids (FISH OIL) 1000 MG CAPS Take 1,000 mg by mouth daily.  04/12/16  Yes [provider]  potassium chloride  SA (KLOR-CON  M) 20 MEQ tablet Take 1 tablet (20 mEq total) by mouth daily. 01/19/23  Yes Babara Call, MD  rosuvastatin  (CRESTOR ) 10 MG tablet Take 1 tablet (10 mg total) by mouth daily. 10/12/23  Yes Liana Mardy, NP  Accu-Chek Softclix Lancets lancets Use 1 lancet to check glucose once daily and as needed for diabetes 06/29/23   Liana Mardy, NP  Alcohol Swabs (B-D SINGLE USE SWABS REGULAR) PADS Use as directed twice a day 04/09/22   Liana Mardy, NP  Blood Glucose Monitoring Suppl (ACCU-CHEK GUIDE ME) w/Device KIT Use as directed. E11.65 06/29/23   Liana Mardy, NP  Blood Glucose Monitoring Suppl (TRUE METRIX AIR GLUCOSE METER) w/Device KIT 1 Device by Does not apply route in the morning and at bedtime. Use ad directed e11.65 10/24/20   Khan, Fozia M, MD  glucose blood (ACCU-CHEK GUIDE TEST) test strip Use 1 test strip to check glucose 3 times daily as needed for diabetes E11.65 06/29/23   Liana Mardy, NP  insulin  detemir (LEVEMIR  FLEXPEN) 100 UNIT/ML FlexPen Inject 34 Units into the skin daily. Patient not taking: Reported on 11/22/2023 02/18/23   Liana Mardy, NP  Insulin  Pen Needle (PEN NEEDLES) 31G X 8 MM MISC Use as directed with insulin  E11.65  10/09/22   Liana Mardy, NP  Semaglutide , 1 MG/DOSE, (OZEMPIC , 1 MG/DOSE,) 2 MG/1.5ML SOPN Inject 1 mg into the skin once a week. FOR DM Patient not taking: Reported on 11/22/2023 06/09/23   Liana Mardy, NP  [facility] 1-0.2 % SUSP  08/15/19   [provider]    Physical Exam: Vitals:   11/21/23 2157 11/21/23 2200  BP: (!) 176/77   Pulse: 68   Temp: 98.4 F (36.9 C)   TempSrc: Oral   SpO2: 100%   Weight:  79.4 kg  Height:  5' 1 (1.549 m)   Physical Exam Vitals and nursing note reviewed.  Constitutional:      General: She is not in acute distress.    Comments: Increased work of breathing.  Conversational dyspnea  HENT:     Head: Normocephalic and atraumatic.   Cardiovascular:  Rate and Rhythm: Normal rate and regular rhythm.     Heart sounds: Normal heart sounds.  Pulmonary:     Effort: Tachypnea present.     Breath sounds: Wheezing present.  Abdominal:     Palpations: Abdomen is soft.     Tenderness: There is no abdominal tenderness.   Neurological:     Mental Status: Mental status is at baseline.     Labs on Admission: I have personally reviewed following labs and imaging studies  CBC: Recent Labs  Lab 11/21/23 2206  WBC 6.5  NEUTROABS 3.1  HGB 12.1  HCT 35.6*  MCV 87.3  PLT 180   Basic Metabolic Panel: Recent Labs  Lab 11/21/23 2206  NA 139  K 3.4*  CL 107  CO2 22  GLUCOSE 135*  BUN 17  CREATININE 0.72  CALCIUM  8.7*   GFR: Estimated Creatinine Clearance: 65.1 mL/min (by C-G formula based on SCr of 0.72 mg/dL). Liver Function Tests: No results for input(s): AST, ALT, ALKPHOS, BILITOT, PROT, ALBUMIN in the last 168 hours. No results for input(s): LIPASE, AMYLASE in the last 168 hours. No results for input(s): AMMONIA in the last 168 hours. Coagulation Profile: No results for input(s): INR, PROTIME in the last 168 hours. Cardiac Enzymes: No results for input(s): CKTOTAL, CKMB, CKMBINDEX,  TROPONINI in the last 168 hours. BNP (last 3 results) No results for input(s): PROBNP in the last 8760 hours. HbA1C: No results for input(s): HGBA1C in the last 72 hours. CBG: No results for input(s): GLUCAP in the last 168 hours. Lipid Profile: No results for input(s): CHOL, HDL, LDLCALC, TRIG, CHOLHDL, LDLDIRECT in the last 72 hours. Thyroid  Function Tests: No results for input(s): TSH, T4TOTAL, FREET4, T3FREE, THYROIDAB in the last 72 hours. Anemia Panel: No results for input(s): VITAMINB12, FOLATE, FERRITIN, TIBC, IRON, RETICCTPCT in the last 72 hours. Urine analysis:    Component Value Date/Time   COLORURINE YELLOW (A) 09/03/2019 1825   APPEARANCEUR Cloudy (A) 10/03/2021 1018   LABSPEC 1.020 09/03/2019 1825   LABSPEC 1.033 05/25/2013 1617   PHURINE 5.0 09/03/2019 1825   GLUCOSEU Negative 10/03/2021 1018   GLUCOSEU 150 mg/dL 87/75/7985 8382   HGBUR NEGATIVE 09/03/2019 1825   BILIRUBINUR Negative 10/03/2021 1018   BILIRUBINUR Negative 05/25/2013 1617   KETONESUR NEGATIVE 09/03/2019 1825   PROTEINUR Negative 10/03/2021 1018   PROTEINUR NEGATIVE 09/03/2019 1825   UROBILINOGEN 0.2 12/08/2019 1525   NITRITE Positive (A) 10/03/2021 1018   NITRITE NEGATIVE 09/03/2019 1825   LEUKOCYTESUR 2+ (A) 10/03/2021 1018   LEUKOCYTESUR NEGATIVE 09/03/2019 1825   LEUKOCYTESUR Negative 05/25/2013 1617    Radiological Exams on Admission: DG Chest Portable 1 View Result Date: 11/21/2023 CLINICAL DATA:  Shortness of breath EXAM: PORTABLE CHEST 1 VIEW COMPARISON:  Chest x-ray 07/05/2022 FINDINGS: Left chest port catheter tip projects over the distal SVC. Right axillary surgical clips are present. The heart size and mediastinal contours are within normal limits. Both lungs are clear. The visualized skeletal structures are unremarkable. IMPRESSION: No active disease. Electronically Signed   By: Greig Pique M.D.   On: 11/21/2023 22:34   Data Reviewed for  HPI: Relevant notes from primary care and specialist visits, past discharge summaries as available in EHR, including Care Everywhere. Prior diagnostic testing as pertinent to current admission diagnoses Updated medications and problem lists for reconciliation ED course, including vitals, labs, imaging, treatment and response to treatment Triage notes, nursing and pharmacy notes and ED provider's notes Notable results as noted above in HPI  Assessment and Plan: * COPD exacerbation (HCC) Scheduled and as needed nebulized bronchodilators, IV steroids Antitussives, flutter valve, antitussives and flutter valve Supplemental oxygen if needed  Type II diabetes mellitus (HCC) Continue basal insulin  Sliding scale insulin  coverage  Malignant 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 Observation status is judged to be reasonable and necessary in order to provide the required intensity of service to ensure the patient's safety. The patient's presenting symptoms, physical exam findings, and initial radiographic and laboratory data in the context of their medical condition is felt to place them at decreased risk for further clinical deterioration. Furthermore, it is anticipated that the patient will be medically stable for discharge from the hospital within 2 midnights of admission.   Author: Delayne LULLA Solian, MD 11/22/2023 2:22 AM  For on call review [email or web address].

## 2023-11-22 NOTE — Progress Notes (Signed)
 Patient admitted after midnight, please see H&P by Dr. Cleatus.  Acute COPD exacerbation (HCC) with hypoxia Scheduled and as needed nebulized bronchodilators, IV steroids Antitussives, flutter valve, antitussives and flutter valve Weaned to room air - Quit smoking 1 week ago   Type II diabetes mellitus (HCC) -Sliding scale insulin  - Per home medication list not on long-acting any longer   Malignant neoplasm of upper-outer quadrant of right female breast (HCC) No acute issues suspected   HTN (hypertension) Continue home amlodipine , bisoprolol  and lisinopril   Hypokalemia - Replete  Harlene Bowl DO

## 2023-11-22 NOTE — Assessment & Plan Note (Signed)
Continue basal insulin.  Sliding scale insulin coverage

## 2023-11-22 NOTE — Assessment & Plan Note (Signed)
 Continue home amlodipine , bisoprolol  and lisinopril 

## 2023-11-22 NOTE — Assessment & Plan Note (Signed)
 Scheduled and as needed nebulized bronchodilators, IV steroids Antitussives, flutter valve, antitussives and flutter valve Supplemental oxygen if needed

## 2023-11-22 NOTE — Plan of Care (Signed)

## 2023-11-22 NOTE — ED Provider Notes (Signed)
 1:00 AM  Assumed care at shift change.  Patient here with COPD exacerbation.  On my reassessment, patient still has audible wheezing, mild tachypnea but no hypoxia at rest or with ambulation.  Will continue breathing treatments.  She did receive Solu-Medrol with EMS.  She states she is feeling better than she did previously but given she still has significant wheezing on exam, tachypnea, will admit to the hospitalist.    Consulted and discussed patient's case with hospitalist, Dr. Cleatus.  I have recommended admission and consulting physician agrees and will place admission orders.  Patient (and family if present) agree with this plan.   I reviewed all nursing notes, vitals, pertinent previous records.  All labs, EKGs, imaging ordered have been independently reviewed and interpreted by myself.    Dawn Alvarez, Dawn SAILOR, DO 11/22/23 [phone]

## 2023-11-23 DIAGNOSIS — J441 Chronic obstructive pulmonary disease with (acute) exacerbation: Secondary | ICD-10-CM | POA: Diagnosis not present

## 2023-11-23 DIAGNOSIS — I10 Essential (primary) hypertension: Secondary | ICD-10-CM | POA: Diagnosis not present

## 2023-11-23 DIAGNOSIS — C50411 Malignant neoplasm of upper-outer quadrant of right female breast: Secondary | ICD-10-CM | POA: Diagnosis not present

## 2023-11-23 DIAGNOSIS — E119 Type 2 diabetes mellitus without complications: Secondary | ICD-10-CM | POA: Diagnosis not present

## 2023-11-23 DIAGNOSIS — Z171 Estrogen receptor negative status [ER-]: Secondary | ICD-10-CM

## 2023-11-23 DIAGNOSIS — Z794 Long term (current) use of insulin: Secondary | ICD-10-CM

## 2023-11-23 LAB — CBC
HCT: 34.9 % — ABNORMAL LOW (ref 36.0–46.0)
Hemoglobin: 11.6 g/dL — ABNORMAL LOW (ref 12.0–15.0)
MCH: 29 pg (ref 26.0–34.0)
MCHC: 33.2 g/dL (ref 30.0–36.0)
MCV: 87.3 fL (ref 80.0–100.0)
Platelets: 191 10*3/uL (ref 150–400)
RBC: 4 MIL/uL (ref 3.87–5.11)
RDW: 13.5 % (ref 11.5–15.5)
WBC: 12.4 10*3/uL — ABNORMAL HIGH (ref 4.0–10.5)
nRBC: 0 % (ref 0.0–0.2)

## 2023-11-23 LAB — BASIC METABOLIC PANEL WITH GFR
Anion gap: 7 (ref 5–15)
BUN: 23 mg/dL (ref 8–23)
CO2: 22 mmol/L (ref 22–32)
Calcium: 8.9 mg/dL (ref 8.9–10.3)
Chloride: 106 mmol/L (ref 98–111)
Creatinine, Ser: 0.86 mg/dL (ref 0.44–1.00)
GFR, Estimated: >60 mL/min (ref >60)
Glucose, Bld: 375 mg/dL — ABNORMAL HIGH (ref 70–99)
Potassium: 4.5 mmol/L (ref 3.5–5.1)
Sodium: 135 mmol/L (ref 135–145)

## 2023-11-23 LAB — GLUCOSE, CAPILLARY
Glucose-Capillary: 256 mg/dL — ABNORMAL HIGH (ref 70–99)
Glucose-Capillary: 309 mg/dL — ABNORMAL HIGH (ref 70–99)
Glucose-Capillary: 151 mg/dL — ABNORMAL HIGH (ref 70–99)
Glucose-Capillary: 362 mg/dL — ABNORMAL HIGH (ref 70–99)

## 2023-11-23 LAB — HIV ANTIBODY (ROUTINE TESTING W REFLEX): HIV Screen 4th Generation wRfx: NONREACTIVE

## 2023-11-23 MED ORDER — ALUM & MAG HYDROXIDE-SIMETH 200-200-20 MG/5ML PO SUSP
30.0000 mL | ORAL | Status: DC | PRN
  Administered 2023-11-23: 30 mL via ORAL
  Filled 2023-11-23: qty 30

## 2023-11-23 MED ORDER — INSULIN ASPART 100 UNIT/ML IJ SOLN
5.0000 [IU] | Freq: Three times a day (TID) | INTRAMUSCULAR | Status: DC
  Administered 2023-11-23 – 2023-11-24 (×4): 5 [IU] via SUBCUTANEOUS
  Filled 2023-11-23 (×4): qty 1

## 2023-11-23 NOTE — Hospital Course (Addendum)
 Taken from prior notes.  Dawn Alvarez is a 68 y.o. female with medical history significant for right-sided breast cancer , IDT2DM, HTN being admitted with a COPD exacerbation.  Apparently she quit smoking 1 week ago.  On presentation mildly elevated blood pressure at 176/77 otherwise normal vital.  Labs mostly unremarkable and chest x-ray was nonacute..  Patient received multiple treatments with DuoNeb and Solu-Medrol as she continued to wheeze.  6/23: Blood pressure mildly elevated.  CBG elevated at 256 likely due to steroid use. Mild leukocytosis secondary to steroid.  Adding 5 units with meal plus SSI. Rest of the labs stable.  Patient was able to wean to room air.  Still having some wheeze.  6/24: Patient remained on room air.  Vital stable.  Apparently she was taken off from her home Lantus by PCP.  A1c of 7.3 with hyperglycemia.  Patient was also given prednisone for 3 more days.  She was started on Janumet as she would like to take some pills instead of an injection at this time.  Patient needed close follow-up with PCP so they can adjust her diabetic medications.  She was also given a new prescription for a glucometer as she told me that she never received glucometer by PCP as it was prescribed in January.  Patient will continue on current medications and need to have a close follow-up with her providers for further assistance.  She will need continuation of counseling for smoking cessation.

## 2023-11-23 NOTE — Plan of Care (Signed)

## 2023-11-23 NOTE — Progress Notes (Signed)
 SATURATION QUALIFICATIONS: (This note is used to comply with regulatory documentation for home oxygen)  Patient Saturations on Room Air at Rest = 96%  Patient Saturations on Room Air while Ambulating = 95%  Patient Saturations on 0 Liters of oxygen while Ambulating = 95%  Please briefly explain why patient needs home oxygen:  Patient's SpO2 remained above 95% on RA during ambulation.

## 2023-11-23 NOTE — Plan of Care (Signed)

## 2023-11-23 NOTE — Inpatient Diabetes Management (Signed)
 Inpatient Diabetes Program Recommendations  AACE/ADA: New Consensus Statement on Inpatient Glycemic Control (2015)  Target Ranges:  Prepandial:   less than 140 mg/dL      Peak postprandial:   less than 180 mg/dL (1-2 hours)      Critically ill patients:  140 - 180 mg/dL    Latest Reference Range & Units 10/12/23 10:17  Hemoglobin A1C 4.0 - 5.6 % 7.3 !  !: Data is abnormal  Latest Reference Range & Units 11/22/23 09:00 11/22/23 12:23 11/22/23 17:02 11/22/23 21:54  Glucose-Capillary 70 - 99 mg/dL 746 (H)  11 units Novolog   311 (H)  15 units Novolog   301 (H)  15 units Novolog   290 (H)  3 units Novolog    (H): Data is abnormally high  Latest Reference Range & Units 11/23/23 07:39  Glucose-Capillary 70 - 99 mg/dL 743 (H)  11 units Novolog    (H): Data is abnormally high   Admit with: Acute COPD exacerbation (HCC) with hypoxia   History: DM  Home DM Meds: Levemir  34 units daily (NOT taking)       Ozempic  1 mg Qweek (NOT taking)  Current Orders: Novolog  Resistant Correction Scale/ SSI (0-20 units) TID AC + HS     Novolog  5 units TID with meals    Note Solumedrol Stopped--last dose given 3pm 06/22 Started Prednisone 40 mg daily this AM  Note MD started Novolog  5 units TID with meals today  Looks like basal insulin  was stopped by PCP in Jan 2025 (pt had not been taking the basal insulin  for 1mos prior to that PCP visit in Jan)    --Will follow patient during hospitalization--  Adina Rudolpho Arrow RN, MSN, CDCES Diabetes Coordinator Inpatient Glycemic Control Team Team Pager: [phone] (8a-5p)

## 2023-11-23 NOTE — Progress Notes (Signed)
  Progress Note   Patient: Dawn Alvarez FMW:[id number] DOB: [birth year] DOA: 11/21/2023     1 DOS: the patient was seen and examined on 11/23/2023   Brief hospital course: Taken from prior notes.  KEYMORA GRILLOT is a 68 y.o. female with medical history significant for right-sided breast cancer , IDT2DM, HTN being admitted with a COPD exacerbation.  Apparently she quit smoking 1 week ago.  On presentation mildly elevated blood pressure at 176/77 otherwise normal vital.  Labs mostly unremarkable and chest x-ray was nonacute..  Patient received multiple treatments with DuoNeb and Solu-Medrol as she continued to wheeze.  6/23: Blood pressure mildly elevated.  CBG elevated at 256 likely due to steroid use. Mild leukocytosis secondary to steroid.  Adding 5 units with meal plus SSI. Rest of the labs stable.  Patient was able to wean to room air.  Still having some wheeze.  Assessment and Plan: * COPD exacerbation (HCC) Able to wean to room air.  Still having some wheeze. -Continue with steroid -Continue with bronchodilators and supportive therapy -Oxygen as needed -Incentive spirometry  Type II diabetes mellitus (HCC) CBG mildly elevated-likely due to steroid use Patient has stopped taking Levemir  at home Sliding scale insulin  coverage Adding 5 units with meal  HTN (hypertension) Continue home amlodipine , bisoprolol  and lisinopril   Malignant neoplasm of upper-outer quadrant of right female breast (HCC) No acute issues suspected   Subjective: Patient was still feeling little short of breath.  Physical Exam: Vitals:   11/23/23 0738 11/23/23 0942 11/23/23 1415 11/23/23 1546  BP: (!) 168/79   129/67  Pulse: 68   65  Resp: 18   16  Temp: 97.8 F (36.6 C)   98 F (36.7 C)  TempSrc:      SpO2: 94% 96% 96% 98%  Weight:      Height:       General.  Obese lady, in no acute distress. Pulmonary.  Scattered wheeze bilaterally, normal respiratory effort. CV.  Regular rate and  rhythm, no JVD, rub or murmur. Abdomen.  Soft, nontender, nondistended, BS positive. CNS.  Alert and oriented .  No focal neurologic deficit. Extremities.  No edema, no cyanosis, pulses intact and symmetrical. Psychiatry.  Judgment and insight appears normal.   Data Reviewed: Prior data reviewed  Family Communication: Discussed with patient  Disposition: Status is: Inpatient Remains inpatient appropriate because: Severity of illness  Planned Discharge Destination: Home  DVT prophylaxis.  Lovenox  Time spent: 50 minutes  This record has been created using Conservation officer, historic buildings. Errors have been sought and corrected,but may not always be located. Such creation errors do not reflect on the standard of care.   Author: Amaryllis Dare, MD 11/23/2023 5:06 PM  For on call review [email or web address].

## 2023-11-24 DIAGNOSIS — E119 Type 2 diabetes mellitus without complications: Secondary | ICD-10-CM | POA: Diagnosis not present

## 2023-11-24 DIAGNOSIS — C50411 Malignant neoplasm of upper-outer quadrant of right female breast: Secondary | ICD-10-CM | POA: Diagnosis not present

## 2023-11-24 DIAGNOSIS — I10 Essential (primary) hypertension: Secondary | ICD-10-CM | POA: Diagnosis not present

## 2023-11-24 DIAGNOSIS — J441 Chronic obstructive pulmonary disease with (acute) exacerbation: Secondary | ICD-10-CM | POA: Diagnosis not present

## 2023-11-24 LAB — GLUCOSE, CAPILLARY: Glucose-Capillary: 149 mg/dL — ABNORMAL HIGH (ref 70–99)

## 2023-11-24 MED ORDER — GUAIFENESIN ER 600 MG PO TB12: 600.0000 mg | ORAL_TABLET | Freq: Two times a day (BID) | ORAL | 0 refills | Status: DC | PRN

## 2023-11-24 MED ORDER — JANUMET 50-1000 MG PO TABS: 1.0000 | ORAL_TABLET | Freq: Two times a day (BID) | ORAL | 2 refills | Status: DC

## 2023-11-24 MED ORDER — BLOOD GLUCOSE TEST VI STRP: 1.0000 | ORAL_STRIP | Freq: Three times a day (TID) | 0 refills | Status: AC

## 2023-11-24 MED ORDER — LANCET DEVICE MISC: 1.0000 | Freq: Three times a day (TID) | 0 refills | Status: AC

## 2023-11-24 MED ORDER — FLUTICASONE FUROATE-VILANTEROL 100-25 MCG/ACT IN AEPB: 1.0000 | INHALATION_SPRAY | Freq: Every day | RESPIRATORY_TRACT | 1 refills | Status: DC

## 2023-11-24 MED ORDER — BLOOD GLUCOSE MONITORING SUPPL DEVI: 1.0000 | Freq: Three times a day (TID) | 0 refills | Status: DC

## 2023-11-24 MED ORDER — LANCETS MISC. MISC: 1.0000 | Freq: Three times a day (TID) | 0 refills | Status: AC

## 2023-11-24 MED ORDER — ALBUTEROL SULFATE HFA 108 (90 BASE) MCG/ACT IN AERS: 2.0000 | INHALATION_SPRAY | Freq: Four times a day (QID) | RESPIRATORY_TRACT | 2 refills | Status: DC | PRN

## 2023-11-24 MED ORDER — PREDNISONE 20 MG PO TABS
40.0000 mg | ORAL_TABLET | Freq: Every day | ORAL | 0 refills | Status: AC
Start: 2023-11-24 — End: 2023-11-27

## 2023-11-24 NOTE — Discharge Summary (Signed)
 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  Dawn Alvarez is a 68 y.o. female with medical history significant for right-sided breast cancer , IDT2DM, HTN being admitted with a COPD exacerbation.  Apparently she quit smoking 1 week ago.  On presentation mildly elevated blood pressure at 176/77 otherwise normal vital.  Labs mostly unremarkable and chest x-ray was nonacute..  Patient received multiple treatments with DuoNeb and Solu-Medrol as she continued to wheeze.  6/23: Blood pressure mildly elevated.  CBG elevated at 256 likely due to steroid use. Mild leukocytosis secondary to steroid.  Adding 5 units with meal plus SSI. Rest of the labs stable.  Patient was able to wean to room air.  Still having some wheeze.  6/24: Patient remained on room air.  Vital stable.  Apparently she was taken off from her home Lantus by PCP.  A1c of 7.3 with hyperglycemia.  Patient was also given prednisone for 3 more days.  She was started on Janumet as she would like to take some pills instead of an injection at this time.  Patient needed close follow-up with PCP so they can adjust her diabetic medications.  She was also given a new prescription for a glucometer as she told me that she never received glucometer by PCP as it was prescribed in January.  Patient will continue on current medications and need to have a close  follow-up with her providers for further assistance.  She will need continuation of counseling for smoking cessation.  Assessment and Plan: * COPD exacerbation (HCC) Able to wean to room air.   -Continue with steroid-for 3 more days of prednisone ordered -Continue with bronchodilators and supportive therapy -Incentive spirometry  Type II diabetes mellitus (HCC) CBG mildly elevated-likely due to steroid use Patient has stopped taking Levemir  at home A1c of 7.3.  Patient was started on Janumet as she does not want to take injectable at this time.  Needed close follow-up with PCP  HTN (hypertension) Continue home amlodipine , bisoprolol  and lisinopril   Malignant neoplasm of upper-outer quadrant of right female breast (HCC) No acute issues suspected  Consultants: None Procedures performed: None Disposition: Home Diet recommendation:  Discharge Diet Orders (From admission, onward)     Start     Ordered   11/24/23 0000  Diet - low sodium heart healthy        11/24/23 1037           Cardiac and Carb modified diet DISCHARGE MEDICATION: Allergies as of 11/24/2023   No Known Allergies      Medication List     STOP taking these medications    Accu-Chek Guide Me w/Device Kit   Levemir  FlexPen 100 UNIT/ML FlexPen Generic drug: insulin  detemir   Ozempic  (1 MG/DOSE) 2 MG/1.5ML Sopn Generic drug: Semaglutide  (1 MG/DOSE)   Pen Needles 31G X 8 MM Misc   potassium chloride  SA 20 MEQ tablet Commonly known as: KLOR-CON  M  Simbrinza 1-0.2 % Susp Generic drug: Brinzolamide-Brimonidine    True Metrix Air Glucose Meter w/Device Kit Replaced by: Blood Glucose Monitoring Suppl Devi       TAKE these medications    Accu-Chek Softclix Lancets lancets Use 1 lancet to check glucose once daily and as needed for diabetes   albuterol  108 (90 Base) MCG/ACT inhaler Commonly known as: VENTOLIN  HFA Inhale 2 puffs into the lungs every 6 (six) hours as needed for wheezing or  shortness of breath.   allopurinol  100 MG tablet Commonly known as: ZYLOPRIM  Take 1 tablet (100 mg total) by mouth at bedtime. Take 1 tablet by mouth at night for Gout   amLODipine  10 MG tablet Commonly known as: NORVASC  Take 1 tablet (10 mg total) by mouth daily.   B-D SINGLE USE SWABS REGULAR Pads Use as directed twice a day   bisoprolol  5 MG tablet Commonly known as: ZEBETA  Take 1 tablet (5 mg total) by mouth daily.   Blood Glucose Monitoring Suppl Devi 1 each by Does not apply route in the morning, at noon, and at bedtime. May substitute to any manufacturer covered by patient's insurance. Replaces: True Metrix Air Glucose Meter w/Device Kit   BLOOD GLUCOSE TEST STRIPS Strp 1 each by In Vitro route in the morning, at noon, and at bedtime. May substitute to any manufacturer covered by patient's insurance. What changed:  how much to take how to take this when to take this additional instructions   brimonidine  0.2 % ophthalmic solution Commonly known as: ALPHAGAN  INSTILL 1 DROP INTO BOTH EYES  TWICE DAILY   Calcium  600 + D 600-200 MG-UNIT Tabs Generic drug: Calcium  Carb-Cholecalciferol  Take 1 tablet by mouth daily.   celecoxib  200 MG capsule Commonly known as: CELEBREX  TAKE 1 CAPSULE BY MOUTH DAILY  FOR 1 WEEK AND THEN AS NEEDED   colchicine  0.6 MG tablet TAKE FOR A GOUT FLARE/ATTACK:  TAKE 2 TABLETS BY MOUTH THEN  TAKE 1 MORE TABLET 1 HOUR LATER  ON DAY 1, THEN TAKE 1 TABLET BY  MOUTH DAILY UNTIL IT RESOLVES   Fish Oil 1000 MG Caps Take 1,000 mg by mouth daily.   fluticasone furoate-vilanterol 100-25 MCG/ACT Aepb Commonly known as: Breo Ellipta Inhale 1 puff into the lungs daily.   gabapentin  300 MG capsule Commonly known as: NEURONTIN  Take 2 capsule by mouth in the morning, 1 capsule by mouth at lunch/midday, and 3 capsule by mouth at bedtime.   guaiFENesin 600 MG 12 hr tablet Commonly known as: MUCINEX Take 1 tablet (600 mg total) by mouth 2 (two) times  daily as needed for cough or to loosen phlegm.   Janumet 50-1000 MG tablet Generic drug: sitaGLIPtin-metformin Take 1 tablet by mouth 2 (two) times daily with a meal.   Lancet Device Misc 1 each by Does not apply route in the morning, at noon, and at bedtime. May substitute to any manufacturer covered by patient's insurance.   Lancets Misc. Misc 1 each by Does not apply route in the morning, at noon, and at bedtime. May substitute to any manufacturer covered by patient's insurance.   lidocaine -prilocaine  cream Commonly known as: EMLA  Apply topically daily as needed. Apply small amount to port and cover with saran wrap 1-2 hours prior to port access   lisinopril  40 MG tablet Commonly known as: ZESTRIL  Take 1 tablet (40 mg total) by mouth daily.   nicotine  polacrilex 2 MG gum Commonly known as: [employer] Nicotine  Take 1 each (2 mg total) by mouth as needed  for smoking cessation.   predniSONE 20 MG tablet Commonly known as: DELTASONE Take 2 tablets (40 mg total) by mouth daily with breakfast for 3 days.   rosuvastatin  10 MG tablet Commonly known as: CRESTOR  Take 1 tablet (10 mg total) by mouth daily.        Follow-up Information     Liana Fish, NP. Go on 12/01/2023.   Specialty: Nurse Practitioner Why: Go at 11:40am. Contact information: [address] [place] [address] [phone]                Discharge Exam: Filed Weights   11/21/23 2200  Weight: 79.4 kg   General.  Obese lady, in no acute distress. Pulmonary.  Mildly coarse breath sounds bilaterally, normal respiratory effort. CV.  Regular rate and rhythm, no JVD, rub or murmur. Abdomen.  Soft, nontender, nondistended, BS positive. CNS.  Alert and oriented .  No focal neurologic deficit. Extremities.  No edema,  pulses intact and symmetrical. Psychiatry.  Judgment and insight appears normal.   Condition at discharge: stable  The results of significant diagnostics from this hospitalization  (including imaging, microbiology, ancillary and laboratory) are listed below for reference.   Imaging Studies: DG Chest Portable 1 View Result Date: 11/21/2023 CLINICAL DATA:  Shortness of breath EXAM: PORTABLE CHEST 1 VIEW COMPARISON:  Chest x-ray 07/05/2022 FINDINGS: Left chest port catheter tip projects over the distal SVC. Right axillary surgical clips are present. The heart size and mediastinal contours are within normal limits. Both lungs are clear. The visualized skeletal structures are unremarkable. IMPRESSION: No active disease. Electronically Signed   By: Greig Pique M.D.   On: 11/21/2023 22:34    Microbiology: Results for orders placed or performed in visit on 10/03/21  Microscopic Examination     Status: Abnormal   Collection Time: 10/03/21 10:18 AM   Urine  Result Value Ref Range Status   WBC, UA >30 (A) 0 - 5 /hpf Final   RBC, Urine None seen 0 - 2 /hpf Final   Epithelial Cells (non renal) 0-10 0 - 10 /hpf Final   Casts None seen None seen /lpf Final   Bacteria, UA Many (A) None seen/Few Final  Urine Culture, Reflex     Status: Abnormal   Collection Time: 10/03/21 10:18 AM   Urine  Result Value Ref Range Status   Urine Culture, Routine Final report (A)  Final   Organism ID, Bacteria Escherichia coli (A)  Final    Comment: Cefazolin  <=4 ug/mL Cefazolin  with an MIC <=16 predicts susceptibility to the oral agents cefaclor, cefdinir, cefpodoxime, cefprozil, cefuroxime, cephalexin , and loracarbef when used for therapy of uncomplicated urinary tract infections due to E. coli, Klebsiella pneumoniae, and Proteus mirabilis. Greater than 100,000 colony forming units per mL    Antimicrobial Susceptibility Comment  Final    Comment:       ** S = Susceptible; I = Intermediate; R = Resistant **                    P = Positive; N = Negative             MICS are expressed in micrograms per mL    Antibiotic                 RSLT#1    RSLT#2    RSLT#3     RSLT#4 Amoxicillin/Clavulanic Acid    S Ampicillin  S Cefepime                       S Ceftriaxone                    S Cefuroxime                     S Ciprofloxacin                  S Ertapenem                      S Gentamicin                     S Imipenem                       S Levofloxacin                   S Meropenem                      S Nitrofurantoin                  S Piperacillin/Tazobactam        S Tetracycline                   S Tobramycin                     S Trimethoprim /Sulfa              S     Labs: CBC: Recent Labs  Lab 11/21/23 2206 11/23/23 0300  WBC 6.5 12.4*  NEUTROABS 3.1  --   HGB 12.1 11.6*  HCT 35.6* 34.9*  MCV 87.3 87.3  PLT 180 191   Basic Metabolic Panel: Recent Labs  Lab 11/21/23 2206 11/23/23 0300  NA 139 135  K 3.4* 4.5  CL 107 106  CO2 22 22  GLUCOSE 135* 375*  BUN 17 23  CREATININE 0.72 0.86  CALCIUM  8.7* 8.9   Liver Function Tests: No results for input(s): AST, ALT, ALKPHOS, BILITOT, PROT, ALBUMIN in the last 168 hours. CBG: Recent Labs  Lab 11/23/23 0739 11/23/23 1153 11/23/23 1639 11/23/23 2136 11/24/23 0826  GLUCAP 256* 309* 151* 362* 149*    Discharge time spent: greater than 30 minutes.  This record has been created using Conservation officer, historic buildings. Errors have been sought and corrected,but may not always be located. Such creation errors do not reflect on the standard of care.   Signed: Amaryllis Dare, MD [facility] 11/24/2023

## 2023-11-24 NOTE — Care Management Important Message (Signed)
 Important Message  Patient Details  Name: MONDA CHASTAIN MRN: [id number] Date of Birth: [birth year]   Important Message Given:  Yes - Medicare IM     Rojelio SHAUNNA Rattler 11/24/2023, 12:11 PM

## 2023-11-24 NOTE — Plan of Care (Signed)
  Problem: Education: Goal: Ability to describe self-care measures that may prevent or decrease complications (Diabetes Survival Skills Education) will improve Outcome: Adequate for Discharge Goal: Individualized Educational Video(s) Outcome: Adequate for Discharge   Problem: Coping: Goal: Ability to adjust to condition or change in health will improve Outcome: Adequate for Discharge   Problem: Fluid Volume: Goal: Ability to maintain a balanced intake and output will improve Outcome: Adequate for Discharge   Problem: Health Behavior/Discharge Planning: Goal: Ability to identify and utilize available resources and services will improve Outcome: Adequate for Discharge Goal: Ability to manage health-related needs will improve Outcome: Adequate for Discharge   Problem: Metabolic: Goal: Ability to maintain appropriate glucose levels will improve Outcome: Adequate for Discharge   Problem: Nutritional: Goal: Maintenance of adequate nutrition will improve Outcome: Adequate for Discharge Goal: Progress toward achieving an optimal weight will improve Outcome: Adequate for Discharge   Problem: Skin Integrity: Goal: Risk for impaired skin integrity will decrease Outcome: Adequate for Discharge   Problem: Tissue Perfusion: Goal: Adequacy of tissue perfusion will improve Outcome: Adequate for Discharge   Problem: Education: Goal: Knowledge of General Education information will improve Description: Including pain rating scale, medication(s)/side effects and non-pharmacologic comfort measures Outcome: Adequate for Discharge   Problem: Health Behavior/Discharge Planning: Goal: Ability to manage health-related needs will improve Outcome: Adequate for Discharge   Problem: Clinical Measurements: Goal: Ability to maintain clinical measurements within normal limits will improve Outcome: Adequate for Discharge Goal: Will remain free from infection Outcome: Adequate for Discharge Goal:  Diagnostic test results will improve Outcome: Adequate for Discharge Goal: Respiratory complications will improve Outcome: Adequate for Discharge Goal: Cardiovascular complication will be avoided Outcome: Adequate for Discharge   Problem: Activity: Goal: Risk for activity intolerance will decrease Outcome: Adequate for Discharge   Problem: Nutrition: Goal: Adequate nutrition will be maintained Outcome: Adequate for Discharge   Problem: Coping: Goal: Level of anxiety will decrease Outcome: Adequate for Discharge   Problem: Elimination: Goal: Will not experience complications related to bowel motility Outcome: Adequate for Discharge Goal: Will not experience complications related to urinary retention Outcome: Adequate for Discharge   Problem: Pain Managment: Goal: General experience of comfort will improve and/or be controlled Outcome: Adequate for Discharge   Problem: Safety: Goal: Ability to remain free from injury will improve Outcome: Adequate for Discharge   Problem: Skin Integrity: Goal: Risk for impaired skin integrity will decrease Outcome: Adequate for Discharge   Problem: Education: Goal: Knowledge of disease or condition will improve Outcome: Adequate for Discharge Goal: Knowledge of the prescribed therapeutic regimen will improve Outcome: Adequate for Discharge Goal: Individualized Educational Video(s) Outcome: Adequate for Discharge   Problem: Activity: Goal: Ability to tolerate increased activity will improve Outcome: Adequate for Discharge Goal: Will verbalize the importance of balancing activity with adequate rest periods Outcome: Adequate for Discharge   Problem: Respiratory: Goal: Ability to maintain a clear airway will improve Outcome: Adequate for Discharge Goal: Levels of oxygenation will improve Outcome: Adequate for Discharge Goal: Ability to maintain adequate ventilation will improve Outcome: Adequate for Discharge   Problem:  Education: Goal: Ability to describe self-care measures that may prevent or decrease complications (Diabetes Survival Skills Education) will improve Outcome: Adequate for Discharge   Problem: Coping: Goal: Ability to adjust to condition or change in health will improve Outcome: Adequate for Discharge

## 2023-11-29 ENCOUNTER — Emergency Department

## 2023-11-29 ENCOUNTER — Other Ambulatory Visit: Payer: Self-pay

## 2023-11-29 ENCOUNTER — Emergency Department
Admission: EM | Admit: 2023-11-29 | Discharge: 2023-11-29 | Disposition: A | Discharge status: Home / Self Care | Attending: Emergency Medicine | Admitting: Emergency Medicine

## 2023-11-29 DIAGNOSIS — J4489 Other specified chronic obstructive pulmonary disease: Secondary | ICD-10-CM | POA: Insufficient documentation

## 2023-11-29 DIAGNOSIS — I10 Essential (primary) hypertension: Secondary | ICD-10-CM | POA: Insufficient documentation

## 2023-11-29 DIAGNOSIS — D72829 Elevated white blood cell count, unspecified: Secondary | ICD-10-CM | POA: Insufficient documentation

## 2023-11-29 DIAGNOSIS — R0600 Dyspnea, unspecified: Secondary | ICD-10-CM | POA: Diagnosis not present

## 2023-11-29 DIAGNOSIS — R55 Syncope and collapse: Secondary | ICD-10-CM | POA: Insufficient documentation

## 2023-11-29 DIAGNOSIS — E119 Type 2 diabetes mellitus without complications: Secondary | ICD-10-CM | POA: Diagnosis not present

## 2023-11-29 LAB — BASIC METABOLIC PANEL WITH GFR
Anion gap: 11 (ref 5–15)
BUN: 16 mg/dL (ref 8–23)
CO2: 23 mmol/L (ref 22–32)
Calcium: 8.9 mg/dL (ref 8.9–10.3)
Chloride: 102 mmol/L (ref 98–111)
Creatinine, Ser: 0.97 mg/dL (ref 0.44–1.00)
GFR, Estimated: >60 mL/min (ref >60)
Glucose, Bld: 280 mg/dL — ABNORMAL HIGH (ref 70–99)
Potassium: 3.5 mmol/L (ref 3.5–5.1)
Sodium: 136 mmol/L (ref 135–145)

## 2023-11-29 LAB — CBC
HCT: 35 % — ABNORMAL LOW (ref 36.0–46.0)
Hemoglobin: 11.9 g/dL — ABNORMAL LOW (ref 12.0–15.0)
MCH: 29.5 pg (ref 26.0–34.0)
MCHC: 34 g/dL (ref 30.0–36.0)
MCV: 86.8 fL (ref 80.0–100.0)
Platelets: 222 10*3/uL (ref 150–400)
RBC: 4.03 MIL/uL (ref 3.87–5.11)
RDW: 13.2 % (ref 11.5–15.5)
WBC: 10.6 10*3/uL — ABNORMAL HIGH (ref 4.0–10.5)
nRBC: 0 % (ref 0.0–0.2)

## 2023-11-29 LAB — TROPONIN I (HIGH SENSITIVITY)
Troponin I (High Sensitivity): 8 ng/L (ref <18)
Troponin I (High Sensitivity): 10 ng/L (ref <18)

## 2023-11-29 MED ORDER — LACTATED RINGERS IV BOLUS
1000.0000 mL | Freq: Once | INTRAVENOUS | Status: AC
  Administered 2023-11-29: 1000 mL via INTRAVENOUS

## 2023-11-29 MED ORDER — KETOROLAC TROMETHAMINE 30 MG/ML IJ SOLN
15.0000 mg | Freq: Once | INTRAMUSCULAR | Status: AC
  Administered 2023-11-29: 15 mg via INTRAVENOUS
  Filled 2023-11-29: qty 1

## 2023-11-29 MED ORDER — ONDANSETRON HCL 4 MG/2ML IJ SOLN
4.0000 mg | Freq: Once | INTRAMUSCULAR | Status: AC
  Administered 2023-11-29: 4 mg via INTRAVENOUS
  Filled 2023-11-29: qty 2

## 2023-11-29 MED ORDER — IPRATROPIUM-ALBUTEROL 0.5-2.5 (3) MG/3ML IN SOLN
3.0000 mL | Freq: Once | RESPIRATORY_TRACT | Status: AC
  Administered 2023-11-29: 3 mL via RESPIRATORY_TRACT
  Filled 2023-11-29: qty 3

## 2023-11-29 NOTE — ED Provider Notes (Signed)
 [facility] Provider Note    Event Date/Time   First MD Initiated Contact with Patient 11/29/23 1228     (approximate)  History   Chief Complaint: Near Syncope and Shortness of Breath  HPI  Dawn Alvarez is a 68 y.o. female with a past medical history of asthma, COPD, diabetes, hypertension, hyperlipidemia, presents to the emergency department for a syncopal versus near syncopal episode.  According to the patient she was at a laundromat, states she was very warm as it was very hot inside of the [place].  Patient states she began feeling very lightheaded and felt she was going to pass out or possibly briefly passed out.  Upon arrival patient states she is feeling better now that she is in the air conditioning.  Vital signs reassuring however O2 sat of 95% on room air with mild expiratory wheezes bilaterally.  Physical Exam   Triage Vital Signs: ED Triage Vitals  Encounter Vitals Group     BP 11/29/23 1204 117/61     Girls Systolic BP Percentile --      Girls Diastolic BP Percentile --      Boys Systolic BP Percentile --      Boys Diastolic BP Percentile --      Pulse Rate 11/29/23 1204 65     Resp 11/29/23 1204 17     Temp 11/29/23 1204 98.5 F (36.9 C)     Temp Source 11/29/23 1204 Oral     SpO2 11/29/23 1204 95 %     Weight --      Height --      Head Circumference --      Peak Flow --      Pain Score 11/29/23 1207 0     Pain Loc --      Pain Education --      Exclude from Growth Chart --     Most recent vital signs: Vitals:   11/29/23 1204  BP: 117/61  Pulse: 65  Resp: 17  Temp: 98.5 F (36.9 C)  SpO2: 95%    General: Awake, no distress.  CV:  Good peripheral perfusion.  Regular rate and rhythm  Resp:  Normal effort.  Equal breath sounds bilaterally.  Mild expiratory wheezes bilaterally Abd:  No distention.  Soft, nontender.  No rebound or guarding.  ED Results / Procedures / Treatments   EKG  EKG viewed and interpreted by  myself shows a sinus rhythm at 68 bpm with a narrow QRS, normal axis, normal intervals, no concerning ST changes.  RADIOLOGY  I have reviewed interpret the CT images.  No large bleed seen on my evaluation. Radiology has read the CT as negative for acute infarct   MEDICATIONS ORDERED IN ED: Medications  lactated ringers  bolus 1,000 mL (has no administration in time range)  ketorolac (TORADOL) 30 MG/ML injection 15 mg (has no administration in time range)  ondansetron  (ZOFRAN ) injection 4 mg (has no administration in time range)     IMPRESSION / MDM / ASSESSMENT AND PLAN / ED COURSE  I reviewed the triage vital signs and the nursing notes.  Patient's presentation is most consistent with acute presentation with potential threat to life or bodily function.  Patient presents to the emergency department for a syncopal versus near syncopal episode.  Patient states she was at a laundromat which she states was extremely hot inside, she began feeling very lightheaded and weak and had a brief near syncopal versus syncopal episode.  Patient  states she also began feeling somewhat short of breath and has a history of asthma.  Here patient does have mild expiratory wheezes bilaterally.  Patient states she is feeling much better now that she is lying in bed in air conditioning.  Patient's vital signs are reassuring besides the wheeze physical exam is otherwise reassuring.  Basic labs including a CBC and a chemistry showed no significant findings besides mild hyperglycemia of 280.  We will IV hydrate the patient we will treat with a DuoNeb.  Patient is complaining of a moderate headache but she states started after the event.  Could be dehydration related as well however we will obtain a CT scan of the head as a precaution as the patient states she does not typically get headaches.  We will also perform a troponin as well as a repeat troponin in 2 hours to rule out any cardiac abnormality, EKG is reassuring.   Patient is agreeable to this plan of care.  Patient's workup is reassuring reassuring CBC reassuring chemistry.  Troponin has resulted negative x 2.  CT scan head is negative.  Patient is feeling much better in the emergency department.  Denies any symptoms.  We will discharge the patient home with outpatient follow-up.  Patient agreeable to plan.  FINAL CLINICAL IMPRESSION(S) / ED DIAGNOSES   Near syncope    Note:  This document was prepared using Dragon voice recognition software and may include unintentional dictation errors.   Dorothyann Drivers, MD 11/29/23 1520

## 2023-11-29 NOTE — ED Notes (Addendum)
 Dr Dorothyann notified that pt answered yes to the do you feel unsafe in your current relationship question during triage.

## 2023-11-29 NOTE — ED Triage Notes (Signed)
 Pt arrives via [facility] from the laundromat for a near syncope with no LOC episode and SOB. Pt hit head but is not on any blood thinners. Pt recently discharged from the hospital. EMS heard wheezing. Pt diaphoretic upon EMS arrival. HX of type 2 diabetes.   EMS vitals: 300 CBG 92% on RA then 98% after 1 duoneb

## 2023-12-01 ENCOUNTER — Inpatient Hospital Stay: Admitting: Nurse Practitioner

## 2024-01-04 ENCOUNTER — Inpatient Hospital Stay: Payer: 59 | Attending: Oncology

## 2024-01-15 ENCOUNTER — Other Ambulatory Visit: Payer: Self-pay

## 2024-01-15 DIAGNOSIS — Z79899 Other long term (current) drug therapy: Secondary | ICD-10-CM

## 2024-01-15 MED ORDER — COLCHICINE 0.6 MG PO TABS: ORAL_TABLET | ORAL | 2 refills | Status: DC

## 2024-01-18 ENCOUNTER — Other Ambulatory Visit: Payer: 59

## 2024-01-18 ENCOUNTER — Inpatient Hospital Stay: Payer: 59 | Admitting: Oncology

## 2024-01-18 NOTE — Assessment & Plan Note (Deleted)
 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  ER negative, PR weakly positive (<=10%), HER-2 negative, s/p neoadjuvant dose dense AC followed by 12 weekly Taxol. S/p  lumpectomy/sentinel lymph node biopsy ypTis ypN0 No residual invasive carcinoma.  Status post adjuvant radiation. Genetic testing negative.  Labs are reviewed and discussed with patient. Clinically she is doing well. Physical examination showed right breast upper outer quadrant tissue thickening at the surgical site.  she missed mammogram in June 2024 - overdue now. reschedule bilateral diagnostic mammogram  Right breast lymphedema, Follow up with physical therapy/lymphedema clinic.

## 2024-02-15 ENCOUNTER — Ambulatory Visit: Admitting: Nurse Practitioner

## 2024-02-16 ENCOUNTER — Encounter: Payer: Self-pay | Admitting: Oncology

## 2024-02-16 ENCOUNTER — Inpatient Hospital Stay (HOSPITAL_BASED_OUTPATIENT_CLINIC_OR_DEPARTMENT_OTHER): Admitting: Oncology

## 2024-02-16 ENCOUNTER — Inpatient Hospital Stay: Attending: Oncology

## 2024-02-16 ENCOUNTER — Ambulatory Visit
Admission: RE | Admit: 2024-02-16 | Discharge: 2024-02-16 | Disposition: A | Discharge status: Home / Self Care | Source: Ambulatory Visit | Attending: Oncology | Admitting: Oncology

## 2024-02-16 VITALS — BP 159/80 | HR 66 | Temp 95.9°F | Wt 181.0 lb

## 2024-02-16 DIAGNOSIS — E876 Hypokalemia: Secondary | ICD-10-CM | POA: Diagnosis not present

## 2024-02-16 DIAGNOSIS — Z1231 Encounter for screening mammogram for malignant neoplasm of breast: Secondary | ICD-10-CM | POA: Insufficient documentation

## 2024-02-16 DIAGNOSIS — Z87891 Personal history of nicotine dependence: Secondary | ICD-10-CM | POA: Insufficient documentation

## 2024-02-16 DIAGNOSIS — G620 Drug-induced polyneuropathy: Secondary | ICD-10-CM | POA: Diagnosis not present

## 2024-02-16 DIAGNOSIS — T451X5A Adverse effect of antineoplastic and immunosuppressive drugs, initial encounter: Secondary | ICD-10-CM

## 2024-02-16 DIAGNOSIS — Z17421 Hormone receptor negative with human epidermal growth factor receptor 2 negative status: Secondary | ICD-10-CM

## 2024-02-16 DIAGNOSIS — Z853 Personal history of malignant neoplasm of breast: Secondary | ICD-10-CM | POA: Diagnosis present

## 2024-02-16 DIAGNOSIS — C50411 Malignant neoplasm of upper-outer quadrant of right female breast: Secondary | ICD-10-CM | POA: Diagnosis not present

## 2024-02-16 DIAGNOSIS — Z95828 Presence of other vascular implants and grafts: Secondary | ICD-10-CM

## 2024-02-16 LAB — CBC WITH DIFFERENTIAL (CANCER CENTER ONLY)
Abs Immature Granulocytes: 0.02 K/uL (ref 0.00–0.07)
Basophils Absolute: 0.1 K/uL (ref 0.0–0.1)
Basophils Relative: 1 %
Eosinophils Absolute: 0.2 K/uL (ref 0.0–0.5)
Eosinophils Relative: 3 %
HCT: 33.8 % — ABNORMAL LOW (ref 36.0–46.0)
Hemoglobin: 11.5 g/dL — ABNORMAL LOW (ref 12.0–15.0)
Immature Granulocytes: 0 %
Lymphocytes Relative: 28 %
Lymphs Abs: 1.6 K/uL (ref 0.7–4.0)
MCH: 30 pg (ref 26.0–34.0)
MCHC: 34 g/dL (ref 30.0–36.0)
MCV: 88.3 fL (ref 80.0–100.0)
Monocytes Absolute: 0.5 K/uL (ref 0.1–1.0)
Monocytes Relative: 9 %
Neutro Abs: 3.4 K/uL (ref 1.7–7.7)
Neutrophils Relative %: 59 %
Platelet Count: 168 K/uL (ref 150–400)
RBC: 3.83 MIL/uL — ABNORMAL LOW (ref 3.87–5.11)
RDW: 14.6 % (ref 11.5–15.5)
WBC Count: 5.8 K/uL (ref 4.0–10.5)
nRBC: 0 % (ref 0.0–0.2)

## 2024-02-16 LAB — CMP (CANCER CENTER ONLY)
ALT: 20 U/L (ref 0–44)
AST: 33 U/L (ref 15–41)
Albumin: 3.7 g/dL (ref 3.5–5.0)
Alkaline Phosphatase: 36 U/L — ABNORMAL LOW (ref 38–126)
Anion gap: 7 (ref 5–15)
BUN: 10 mg/dL (ref 8–23)
CO2: 26 mmol/L (ref 22–32)
Calcium: 8.9 mg/dL (ref 8.9–10.3)
Chloride: 103 mmol/L (ref 98–111)
Creatinine: 0.79 mg/dL (ref 0.44–1.00)
GFR, Estimated: >60 mL/min (ref >60)
Glucose, Bld: 213 mg/dL — ABNORMAL HIGH (ref 70–99)
Potassium: 3.3 mmol/L — ABNORMAL LOW (ref 3.5–5.1)
Sodium: 136 mmol/L (ref 135–145)
Total Bilirubin: 0.6 mg/dL (ref 0.0–1.2)
Total Protein: 6.9 g/dL (ref 6.5–8.1)

## 2024-02-16 NOTE — Assessment & Plan Note (Signed)
#  Chemotherapy induced neuropathy, grade 1-2.  Symptoms are stable and slightly improved.   Continue gabapentin, managed by pcp

## 2024-02-16 NOTE — Assessment & Plan Note (Addendum)
 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  ER negative, PR weakly positive (<=10%), HER-2 negative, s/p neoadjuvant dose dense AC followed by 12 weekly Taxol . S/p  lumpectomy/sentinel lymph node biopsy ypTis ypN0 No residual invasive carcinoma.  Status post adjuvant radiation. Genetic testing negative.  Labs are reviewed and discussed with patient. Clinically she is doing well. Physical examination showed right breast upper outer quadrant tissue thickening at the surgical site.  she missed mammogram in  overdue now.  bilateral diagnostic mammogram is done this afternoon- result is pending  Right breast lymphedema, Follow up with physical therapy/lymphedema clinic.

## 2024-02-16 NOTE — Assessment & Plan Note (Signed)
Recommend potassium 67meq daily x 3 days.

## 2024-02-16 NOTE — Progress Notes (Signed)
 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  ER negative, PR weakly positive (<=10%), HER-2 negative, s/p neoadjuvant dose dense AC followed by 12 weekly Taxol . S/p  lumpectomy/sentinel lymph node biopsy ypTis ypN0 No residual invasive carcinoma.  Status post adjuvant radiation. Genetic testing negative.  Labs are reviewed and discussed with patient. Clinically she is doing well. Physical examination showed right breast upper outer quadrant tissue thickening at the surgical site.  she missed mammogram in  overdue now.  bilateral diagnostic mammogram is done this afternoon- result is pending  Right breast lymphedema, Follow up with physical therapy/lymphedema clinic.  Port-A-Cath in place Plan to remove medi port.    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  Symptoms are stable and slightly improved.   Continue gabapentin , managed by pcp  Hypokalemia Recommend potassium 20meq daily x 3 days.   Orders Placed This Encounter  Procedures   CMP (Cancer Center only)    Standing Status:   Future    Expected Date:   08/15/2024    Expiration Date:   11/13/2024   CBC with Differential (Cancer Center Only)    Standing Status:   Future    Expected Date:   08/15/2024    Expiration Date:   11/13/2024   Cancer antigen 27.29    Standing Status:   Future    Expected Date:   08/15/2024    Expiration Date:   11/13/2024   Cancer antigen 15-3    Standing Status:   Future    Expected  Date:   08/15/2024    Expiration Date:   11/13/2024   Follow up in 6 months All questions were answered. The patient knows to call the clinic with any problems, questions or concerns.  Call Babara, MD, PhD [facility] Hematology Oncology 02/16/2024   HISTORY OF PRESENTING ILLNESS:  Patient had screening mammogram done 08/12/2017 which showed right breast asymmetry.  A diagnostic mammogram was suggested and patient did not have it done. Patient felt her right breast mass for a few weeks.  She had bilateral diagnostic mammogram done on 06/30/2019. 2.8 x 2.4 x 2.6 cm right breast mass, 11:00, 10 cm from the nipple.  There is a single mildly abnormal node in the right axilla with a cortex measuring up to 4.4 mm.  No other suspicious findings. Patient underwent ultrasound-guided core biopsy of the right breast mass and right axilla lymph node Pathology showed invasive mammary carcinoma, no special type, grade 3, ER/PR HER-2 status are pending. Right axillary lymph node biopsy showed predominantly blood in the fibroadipose tissue, with scant lymphoid tissue present.  No definite malignancy was identified.  Patient was referred to cancer center to establish care and discuss treatment plan. Menarche 13 Postmenopausal.  LMP when she was 68 years old. She recalls use of birth control pills. Denies any hormone .  Replacement therapy. Denies any prior chest radiation. She reports family history of maternal grandmother and 2 maternal cousins were diagnosed with cancer.  She does not know about details. # 09/03/2019, patient had an episode of syncope  and EMS was called and patient sent to emergency room.  Patient was noted to have a heart rate 45-65 and blood glucose level of 286. Patient denies any history of nausea, vomiting, diarrhea.  Denies any dehydration.  She reported history of intermittent abdominal pain. CT head without contrast showed no acute intracranial abnormality.  CT abdomen pelvis with contrast  is negative for evidence of acute abdominal abnormality.  Patient was given IV fluid. Patient was observed with holding her blood pressure medication including amlodipine , bisoprolol .  No arrhythmia was noted on telemetry.  Echocardiogram showed LVEF 60 to 65%.  No valvular abnormalities.  Beta-blocker and diuretics was discontinued.  # Patient was seen by cardiology Dr. Darliss for evaluation of syncope and was cleared for chemotherapy. # 10/03/2019, interval unilateral right diagnostic mammogram showed right breast mass 11:00 size has decreased to 3.7 x 1.3 x 2.6 cm.  Previously mass measured 2.8 x 2.4 x 2.6 cm  Neoadjuvant chemotherapy  08/04/2019-09/21/2019 dose dense AC 10/11/2019-01/02/2020 weekly Taxol  mammogram after chemotherapy showed decreased size of cancer at that time and was sent back to surgeon for surgery. Patient no showed to Dr. Yvonne office in September 2021  03/16/2020 bilateral breast MRI with and without contrast showed previously biopsied mass in the posterior aspect of the upper outer quadrant of the right breast is no longer visualized.  There is a small amount of low-grade linear enhancement extending posteriorly and superiorly from the location towards the right axilla.  Measuring 5.2 x 2.0 x 1.1 cm.  No evidence of malignancy elsewhere in either breast.  No adenopathy.  11/03/2019 genetic testing is negative   # 05/04/2020 right lumpectomy SLNB ypTis ypN0 High-grade DCIS with calcifications, 1 cm, no residual invasive carcinoma.  Margins not involved.  Extensive fibrosis with patchy mild chronic inflammation.  Patient had 5 sentinel lymph node excision and all lymph nodes were negative. Margins were negative  #Adjuvant radiation finished on 08/23/2020. INTERVAL HISTORY Dawn Alvarez is a 68 y.o. female who has above history reviewed by me today presents for follow up visit for management of breast cancer.  Patient reports feeling well.  She does breast examination and  has no new breast complaints. Chemotherapy-induced neuropathy has improved. She takes gabapentin .  overdue for mammogram  . Review of Systems  Constitutional:  Negative for appetite change, chills, fatigue and fever.  HENT:   Negative for hearing loss and voice change.   Eyes:  Negative for eye problems.  Respiratory:  Negative for chest tightness and cough.   Cardiovascular:  Negative for chest pain.  Gastrointestinal:  Negative for abdominal distention, abdominal pain and blood in stool.  Endocrine: Negative for hot flashes.  Genitourinary:  Negative for difficulty urinating and frequency.   Musculoskeletal:  Positive for arthralgias.  Skin:  Negative for itching and rash.  Neurological:  Positive for numbness. Negative for extremity weakness.  Hematological:  Negative for adenopathy.  Psychiatric/Behavioral:  Negative for confusion.     MEDICAL HISTORY:  Past Medical History:  Diagnosis Date   Asthma    Cancer (HCC) 03/2020   right breast [facility]   COPD exacerbation (HCC) 04/12/2016   Diabetes mellitus without complication (HCC)    Hyperlipemia    Hypertension    Personal history of chemotherapy 2021    SURGICAL HISTORY: Past Surgical History:  Procedure Laterality Date   BREAST BIOPSY Right 07/05/2019   us  bx venus marker, grade 3 invasive mammary carcinoma   BREAST BIOPSY Right 07/05/2019   LN  bx, hydromarker,PREDOMINANTLY BLOOD AND FIBROADIPOSE TISSUE, WITH SCANT LYMPHOID TISSUE PRESENT       BREAST LUMPECTOMY Right 05/04/2020   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   BREAST LUMPECTOMY WITH RADIOACTIVE SEED AND SENTINEL LYMPH NODE BIOPSY Right 05/04/2020   Procedure: RIGHT BREAST LUMPECTOMY WITH BRACKETED RADIOACTIVE SEED AND SENTINEL LYMPH NODE BIOPSY;  Surgeon: Curvin Deward MOULD, MD;  Location: [facility];  Service: General;  Laterality: Right;   CESAREAN SECTION     COLONOSCOPY WITH PROPOFOL  N/A 09/11/2015   Procedure:  COLONOSCOPY WITH PROPOFOL ;  Surgeon: Rogelia Copping, MD;  Location: [facility];  Service: Endoscopy;  Laterality: N/A;   IR IMAGING GUIDED PORT INSERTION  07/20/2019    SOCIAL HISTORY: Social History   Socioeconomic History   Marital status: Married    Spouse name: Not on file   Number of children: Not on file   Years of education: Not on file   Highest education level: Not on file  Occupational History   Not on file  Tobacco Use   Smoking status: Former    Current packs/day: 0.00    Average packs/day: 1 pack/day for 2.0 years (2.0 ttl pk-yrs)    Types: Cigarettes    Start date: 02/28/2018    Quit date: 02/29/2020    Years since quitting: 3.9   Smokeless tobacco: Never   Tobacco comments:    Quit smoking last month  Substance and Sexual Activity   Alcohol use: Yes    Comment: social   Drug use: No   Sexual activity: Not Currently    Birth control/protection: Post-menopausal  Other Topics Concern   Not on file  Social History Narrative   Not on file   Social Drivers of Health   Financial Resource Strain: Low Risk  (10/24/2020)   Overall Financial Resource Strain (CARDIA)    Difficulty of Paying Living Expenses: Not very hard  Food Insecurity: No Food Insecurity (11/22/2023)   Hunger Vital Sign    Worried About Running Out of Food in the Last Year: Never true    Ran Out of Food in the Last Year: Never true  Transportation Needs: No Transportation Needs (11/22/2023)   PRAPARE - Administrator, Civil Service (Medical): No    Lack of Transportation (Non-Medical): No  Physical Activity: Not on file  Stress: Not on file  Social Connections: Not on file  Intimate Partner Violence: Not on file    FAMILY HISTORY: Family History  Problem Relation Age of Onset   Diabetes Mother    Hypertension Mother    Hypertension Father    Diabetes Father    Breast cancer Neg Hx     ALLERGIES:  has no known allergies.  MEDICATIONS:  Current Outpatient Medications   Medication Sig Dispense Refill   albuterol  (VENTOLIN  HFA) 108 (90 Base) MCG/ACT inhaler Inhale 2 puffs into the lungs every 6 (six) hours as needed for wheezing or shortness of breath. 8 g 2   allopurinol  (ZYLOPRIM ) 100 MG tablet Take 1 tablet (100 mg total) by mouth at bedtime. Take 1 tablet by mouth at night for Gout 90 tablet 3   amLODipine  (NORVASC ) 10 MG tablet Take 1 tablet (10 mg total) by mouth daily. 90 tablet 0   bisoprolol  (ZEBETA ) 5 MG tablet Take 1 tablet (5 mg total) by mouth daily. 90 tablet 3   brimonidine  (ALPHAGAN ) 0.2 % ophthalmic solution INSTILL 1 DROP INTO BOTH EYES  TWICE DAILY 30  mL 5   Calcium  Carb-Cholecalciferol  (CALCIUM  600 + D) 600-200 MG-UNIT TABS Take 1 tablet by mouth daily.     colchicine  0.6 MG tablet TAKE FOR A GOUT FLARE/ATTACK:  TAKE 2 TABLETS BY MOUTH THEN  TAKE 1 MORE TABLET 1 HOUR LATER  ON DAY 1, THEN TAKE 1 TABLET BY  MOUTH DAILY UNTIL IT RESOLVES 100 tablet 2   gabapentin  (NEURONTIN ) 300 MG capsule Take 2 capsule by mouth in the morning, 1 capsule by mouth at lunch/midday, and 3 capsule by mouth at bedtime. 180 capsule 5   lisinopril  (ZESTRIL ) 40 MG tablet Take 1 tablet (40 mg total) by mouth daily. 90 tablet 3   Accu-Chek Softclix Lancets lancets Use 1 lancet to check glucose once daily and as needed for diabetes (Patient not taking: Reported on 02/16/2024) 100 each 12   Alcohol Swabs (B-D SINGLE USE SWABS REGULAR) PADS Use as directed twice a day (Patient not taking: Reported on 02/16/2024) 300 each 3   Blood Glucose Monitoring Suppl DEVI 1 each by Does not apply route in the morning, at noon, and at bedtime. May substitute to any manufacturer covered by patient's insurance. (Patient not taking: Reported on 02/16/2024) 1 each 0   celecoxib  (CELEBREX ) 200 MG capsule TAKE 1 CAPSULE BY MOUTH DAILY  FOR 1 WEEK AND THEN AS NEEDED (Patient not taking: Reported on 02/16/2024) 100 capsule 1   fluticasone  furoate-vilanterol (BREO ELLIPTA ) 100-25 MCG/ACT AEPB Inhale 1  puff into the lungs daily. (Patient not taking: Reported on 02/16/2024) 60 each 1   guaiFENesin  (MUCINEX ) 600 MG 12 hr tablet Take 1 tablet (600 mg total) by mouth 2 (two) times daily as needed for cough or to loosen phlegm. (Patient not taking: Reported on 02/16/2024) 30 tablet 0   lidocaine -prilocaine  (EMLA ) cream Apply topically daily as needed. Apply small amount to port and cover with saran wrap 1-2 hours prior to port access 30 g 3   nicotine  polacrilex ([employer] NICOTINE ) 2 MG gum Take 1 each (2 mg total) by mouth as needed for smoking cessation. (Patient not taking: Reported on 02/16/2024) 100 tablet 0   Omega-3 Fatty Acids (FISH OIL) 1000 MG CAPS Take 1,000 mg by mouth daily.  (Patient not taking: Reported on 02/16/2024)     rosuvastatin  (CRESTOR ) 10 MG tablet Take 1 tablet (10 mg total) by mouth daily. (Patient not taking: Reported on 02/16/2024) 90 tablet 1   sitaGLIPtin-metformin (JANUMET ) 50-1000 MG tablet Take 1 tablet by mouth 2 (two) times daily with a meal. (Patient not taking: Reported on 02/16/2024) 60 tablet 2   No current facility-administered medications for this visit.   Facility-Administered Medications Ordered in Other Visits  Medication Dose Route Frequency Provider Last Rate Last Admin   heparin  lock flush 100 UNIT/ML injection            sodium chloride  flush (NS) 0.9 % injection 10 mL  10 mL Intravenous PRN Babara Call, MD   10 mL at 10/11/19 0817     PHYSICAL EXAMINATION: ECOG PERFORMANCE STATUS: 1 - Symptomatic but completely ambulatory Vitals:   02/16/24 1300 02/16/24 1313  BP: (!) 161/83 (!) 159/80  Pulse: 66   Temp: (!) 95.9 F (35.5 C)   SpO2: 100%    Filed Weights   02/16/24 1300  Weight: 181 lb (82.1 kg)    Physical Exam Constitutional:      General: She is not in acute distress. HENT:     Head: Normocephalic and atraumatic.  Eyes:  General: No scleral icterus. Cardiovascular:     Rate and Rhythm: Normal rate and regular rhythm.     Heart sounds:  Normal heart sounds.  Pulmonary:     Effort: Pulmonary effort is normal. No respiratory distress.     Breath sounds: Normal breath sounds. No wheezing.  Abdominal:     General: Bowel sounds are normal. There is no distension.     Palpations: Abdomen is soft.  Musculoskeletal:        General: No deformity. Normal range of motion.     Cervical back: Normal range of motion and neck supple.  Skin:    General: Skin is warm and dry.     Findings: No erythema or rash.  Neurological:     Mental Status: She is alert and oriented to person, place, and time. Mental status is at baseline.     Cranial Nerves: No cranial nerve deficit.     Coordination: Coordination normal.  Psychiatric:        Mood and Affect: Mood normal.    Breast exam was performed in seated and lying down position. Patient is status post right breast lumpectomy with a well-healed surgical scar Right breast skin hyperpigmentation with right lower quadrant tissue swelling/lymphedema changes. No palpable left breast mass. No palpable axillary adenopathy bilaterally.    LABORATORY DATA:  I have reviewed the data as listed     Latest Ref Rng & Units 02/16/2024   12:31 PM 11/29/2023   12:07 PM 11/23/2023    3:00 AM  CBC  WBC 4.0 - 10.5 K/uL 5.8  10.6  12.4   Hemoglobin 12.0 - 15.0 g/dL 88.4  88.0  88.3   Hematocrit 36.0 - 46.0 % 33.8  35.0  34.9   Platelets 150 - 400 K/uL 168  222  191       Latest Ref Rng & Units 02/16/2024   12:31 PM 11/29/2023   12:07 PM 11/23/2023    3:00 AM  CMP  Glucose 70 - 99 mg/dL 786  719  624   BUN 8 - 23 mg/dL 10  16  23    Creatinine 0.44 - 1.00 mg/dL 9.20  9.02  9.13   Sodium 135 - 145 mmol/L 136  136  135   Potassium 3.5 - 5.1 mmol/L 3.3  3.5  4.5   Chloride 98 - 111 mmol/L 103  102  106   CO2 22 - 32 mmol/L 26  23  22    Calcium  8.9 - 10.3 mg/dL 8.9  8.9  8.9   Total Protein 6.5 - 8.1 g/dL 6.9     Total Bilirubin 0.0 - 1.2 mg/dL 0.6     Alkaline Phos 38 - 126 U/L 36     AST 15 -  41 U/L 33     ALT 0 - 44 U/L 20          RADIOGRAPHIC STUDIES: I have personally reviewed the radiological images as listed and agreed with the findings in the report. CT HEAD WO CONTRAST ( ) Result Date: 11/29/2023 CLINICAL DATA:  New onset severe headache. EXAM: CT HEAD WITHOUT CONTRAST TECHNIQUE: Contiguous axial images were obtained from the base of the skull through the vertex without intravenous contrast. RADIATION DOSE REDUCTION: This exam was performed according to the departmental dose-optimization program which includes automated exposure control, adjustment of the mA and/or kV according to patient size and/or use of iterative reconstruction technique. COMPARISON:  07/05/2022 FINDINGS: Brain: No evidence of intracranial hemorrhage, acute infarction, hydrocephalus,  extra-axial collection, or mass lesion/mass effect. Severe chronic small vessel disease again noted. 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 Vascular:  No hyperdense vessel or other acute findings. Skull: No evidence of fracture or other significant bone abnormality. Sinuses/Orbits:  No acute findings. Other: None. IMPRESSION: No acute intracranial abnormality. 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 Electronically Signed   By: Norleen DELENA Kil M.D.   On: 11/29/2023 13:34   DG Chest Portable 1 View Result Date: 11/21/2023 CLINICAL DATA:  Shortness of breath EXAM: PORTABLE CHEST 1 VIEW COMPARISON:  Chest x-ray 07/05/2022 FINDINGS: Left chest port catheter tip projects over the distal SVC. Right axillary surgical clips are present. The heart size and mediastinal contours are within normal limits. Both lungs are clear. The visualized skeletal structures are unremarkable. IMPRESSION: No active disease. Electronically Signed   By: Greig Pique M.D.   On: 11/21/2023 22:34

## 2024-02-16 NOTE — Assessment & Plan Note (Addendum)
 Plan to remove medi port.

## 2024-02-18 ENCOUNTER — Ambulatory Visit (INDEPENDENT_AMBULATORY_CARE_PROVIDER_SITE_OTHER): Admitting: Nurse Practitioner

## 2024-02-18 ENCOUNTER — Encounter: Payer: Self-pay | Admitting: Nurse Practitioner

## 2024-02-18 VITALS — BP 138/80 | HR 77 | Temp 96.3°F | Resp 16 | Ht 61.0 in | Wt 179.2 lb

## 2024-02-18 DIAGNOSIS — E1140 Type 2 diabetes mellitus with diabetic neuropathy, unspecified: Secondary | ICD-10-CM

## 2024-02-18 DIAGNOSIS — Z794 Long term (current) use of insulin: Secondary | ICD-10-CM

## 2024-02-18 DIAGNOSIS — E1159 Type 2 diabetes mellitus with other circulatory complications: Secondary | ICD-10-CM | POA: Diagnosis not present

## 2024-02-18 DIAGNOSIS — E785 Hyperlipidemia, unspecified: Secondary | ICD-10-CM

## 2024-02-18 DIAGNOSIS — Z79899 Other long term (current) drug therapy: Secondary | ICD-10-CM

## 2024-02-18 DIAGNOSIS — J441 Chronic obstructive pulmonary disease with (acute) exacerbation: Secondary | ICD-10-CM

## 2024-02-18 DIAGNOSIS — I152 Hypertension secondary to endocrine disorders: Secondary | ICD-10-CM

## 2024-02-18 DIAGNOSIS — I10 Essential (primary) hypertension: Secondary | ICD-10-CM

## 2024-02-18 DIAGNOSIS — Z23 Encounter for immunization: Secondary | ICD-10-CM | POA: Diagnosis not present

## 2024-02-18 DIAGNOSIS — M150 Primary generalized (osteo)arthritis: Secondary | ICD-10-CM

## 2024-02-18 DIAGNOSIS — E1169 Type 2 diabetes mellitus with other specified complication: Secondary | ICD-10-CM | POA: Diagnosis not present

## 2024-02-18 LAB — POCT GLYCOSYLATED HEMOGLOBIN (HGB A1C): Hemoglobin A1C: 8.5 % — AB (ref 4.0–5.6)

## 2024-02-18 MED ORDER — AMLODIPINE BESYLATE 10 MG PO TABS: 10.0000 mg | ORAL_TABLET | Freq: Every day | ORAL | 1 refills | Status: AC

## 2024-02-18 MED ORDER — LISINOPRIL 40 MG PO TABS: 40.0000 mg | ORAL_TABLET | Freq: Every day | ORAL | 3 refills | Status: AC

## 2024-02-18 MED ORDER — BISOPROLOL FUMARATE 5 MG PO TABS: 5.0000 mg | ORAL_TABLET | Freq: Every day | ORAL | 3 refills | Status: AC

## 2024-02-18 MED ORDER — EMPAGLIFLOZIN 25 MG PO TABS: 25.0000 mg | ORAL_TABLET | Freq: Every day | ORAL | 3 refills | Status: AC

## 2024-02-18 MED ORDER — FREESTYLE LIBRE 3 READER DEVI: 1.0000 | Freq: Once | 0 refills | Status: AC

## 2024-02-18 MED ORDER — ROSUVASTATIN CALCIUM 10 MG PO TABS: 10.0000 mg | ORAL_TABLET | Freq: Every day | ORAL | 1 refills | Status: AC

## 2024-02-18 MED ORDER — ALLOPURINOL 100 MG PO TABS: 100.0000 mg | ORAL_TABLET | Freq: Every evening | ORAL | 3 refills | Status: DC

## 2024-02-18 MED ORDER — FLUTICASONE FUROATE-VILANTEROL 100-25 MCG/ACT IN AEPB
1.0000 | INHALATION_SPRAY | Freq: Every day | RESPIRATORY_TRACT | 1 refills | Status: AC
Start: 2024-02-18 — End: ?

## 2024-02-18 MED ORDER — CELECOXIB 200 MG PO CAPS: ORAL_CAPSULE | ORAL | 1 refills | Status: DC

## 2024-02-18 MED ORDER — ALBUTEROL SULFATE HFA 108 (90 BASE) MCG/ACT IN AERS: 2.0000 | INHALATION_SPRAY | Freq: Four times a day (QID) | RESPIRATORY_TRACT | 2 refills | Status: AC | PRN

## 2024-02-18 MED ORDER — FREESTYLE LIBRE 3 PLUS SENSOR MISC: 11 refills | Status: DC

## 2024-02-18 NOTE — Progress Notes (Signed)
 [facility] [address] [place], [place] [address]  Internal MEDICINE  Office Visit Note  Patient Name: Dawn Alvarez  [id number]  [id number]  Date of Service: 02/18/2024  Chief Complaint  Patient presents with   Diabetes   Hypertension   Hyperlipidemia   Follow-up    HPI Sharell presents for a follow-up visit for diabetes, hypertension and high cholesterol.  Diabetes -- A1c is elevated at 8.5. she let her medications run out and has not had most of her medications.  Hypertension -- blood pressure is stable on current medications High cholesterol -- takes rosuvastatin  daily.  COPD -- takes breo ellipta  twice daily. Uses albuterol  as needed.    Current Medication: Outpatient Encounter Medications as of 02/18/2024  Medication Sig   [EXPIRED] Continuous Glucose Receiver (FREESTYLE LIBRE 3 READER) DEVI 1 Device by Does not apply route once for 1 dose.   Continuous Glucose Sensor (FREESTYLE LIBRE 3 PLUS SENSOR) MISC Place sensor on to skin every 15 days to check glucose for CGM for diabetes E11.65   empagliflozin  (JARDIANCE ) 25 MG TABS tablet Take 1 tablet (25 mg total) by mouth daily.   albuterol  (VENTOLIN  HFA) 108 (90 Base) MCG/ACT inhaler Inhale 2 puffs into the lungs every 6 (six) hours as needed for wheezing or shortness of breath.   allopurinol  (ZYLOPRIM ) 100 MG tablet Take 1 tablet (100 mg total) by mouth at bedtime. Take 1 tablet by mouth at night for Gout   amLODipine  (NORVASC ) 10 MG tablet Take 1 tablet (10 mg total) by mouth daily.   bisoprolol  (ZEBETA ) 5 MG tablet Take 1 tablet (5 mg total) by mouth daily.   brimonidine  (ALPHAGAN ) 0.2 % ophthalmic solution INSTILL 1 DROP INTO BOTH EYES  TWICE DAILY   Calcium  Carb-Cholecalciferol  (CALCIUM  600 + D) 600-200 MG-UNIT TABS Take 1 tablet by mouth daily.   celecoxib  (CELEBREX ) 200 MG capsule TAKE 1 CAPSULE BY MOUTH DAILY  FOR 1 WEEK AND THEN AS NEEDED   colchicine  0.6 MG tablet TAKE FOR A GOUT FLARE/ATTACK:  TAKE 2  TABLETS BY MOUTH THEN  TAKE 1 MORE TABLET 1 HOUR LATER  ON DAY 1, THEN TAKE 1 TABLET BY  MOUTH DAILY UNTIL IT RESOLVES   fluticasone  furoate-vilanterol (BREO ELLIPTA ) 100-25 MCG/ACT AEPB Inhale 1 puff into the lungs daily.   gabapentin  (NEURONTIN ) 300 MG capsule Take 2 capsule by mouth in the morning, 1 capsule by mouth at lunch/midday, and 3 capsule by mouth at bedtime.   lidocaine -prilocaine  (EMLA ) cream Apply topically daily as needed. Apply small amount to port and cover with saran wrap 1-2 hours prior to port access   lisinopril  (ZESTRIL ) 40 MG tablet Take 1 tablet (40 mg total) by mouth daily.   rosuvastatin  (CRESTOR ) 10 MG tablet Take 1 tablet (10 mg total) by mouth daily.   [DISCONTINUED] Accu-Chek Softclix Lancets lancets Use 1 lancet to check glucose once daily and as needed for diabetes (Patient not taking: Reported on 02/16/2024)   [DISCONTINUED] albuterol  (VENTOLIN  HFA) 108 (90 Base) MCG/ACT inhaler Inhale 2 puffs into the lungs every 6 (six) hours as needed for wheezing or shortness of breath.   [DISCONTINUED] Alcohol Swabs (B-D SINGLE USE SWABS REGULAR) PADS Use as directed twice a day (Patient not taking: Reported on 02/16/2024)   [DISCONTINUED] allopurinol  (ZYLOPRIM ) 100 MG tablet Take 1 tablet (100 mg total) by mouth at bedtime. Take 1 tablet by mouth at night for Gout   [DISCONTINUED] amLODipine  (NORVASC ) 10 MG tablet Take 1 tablet (10 mg  total) by mouth daily.   [DISCONTINUED] bisoprolol  (ZEBETA ) 5 MG tablet Take 1 tablet (5 mg total) by mouth daily.   [DISCONTINUED] Blood Glucose Monitoring Suppl DEVI 1 each by Does not apply route in the morning, at noon, and at bedtime. May substitute to any manufacturer covered by patient's insurance. (Patient not taking: Reported on 02/16/2024)   [DISCONTINUED] celecoxib  (CELEBREX ) 200 MG capsule TAKE 1 CAPSULE BY MOUTH DAILY  FOR 1 WEEK AND THEN AS NEEDED (Patient not taking: Reported on 02/16/2024)   [DISCONTINUED] fluticasone  furoate-vilanterol  (BREO ELLIPTA ) 100-25 MCG/ACT AEPB Inhale 1 puff into the lungs daily. (Patient not taking: Reported on 02/16/2024)   [DISCONTINUED] guaiFENesin  (MUCINEX ) 600 MG 12 hr tablet Take 1 tablet (600 mg total) by mouth 2 (two) times daily as needed for cough or to loosen phlegm. (Patient not taking: Reported on 02/16/2024)   [DISCONTINUED] lisinopril  (ZESTRIL ) 40 MG tablet Take 1 tablet (40 mg total) by mouth daily.   [DISCONTINUED] nicotine  polacrilex ([employer] NICOTINE ) 2 MG gum Take 1 each (2 mg total) by mouth as needed for smoking cessation. (Patient not taking: Reported on 02/16/2024)   [DISCONTINUED] Omega-3 Fatty Acids (FISH OIL) 1000 MG CAPS Take 1,000 mg by mouth daily.  (Patient not taking: Reported on 02/16/2024)   [DISCONTINUED] rosuvastatin  (CRESTOR ) 10 MG tablet Take 1 tablet (10 mg total) by mouth daily. (Patient not taking: Reported on 02/16/2024)   [DISCONTINUED] sitaGLIPtin-metformin (JANUMET ) 50-1000 MG tablet Take 1 tablet by mouth 2 (two) times daily with a meal. 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   BREAST LUMPECTOMY WITH RADIOACTIVE SEED AND SENTINEL LYMPH NODE BIOPSY Right 05/04/2020   Procedure: RIGHT BREAST LUMPECTOMY WITH BRACKETED RADIOACTIVE SEED AND SENTINEL LYMPH NODE BIOPSY;  Surgeon: Curvin Deward MOULD, MD;  Location: [facility];  Service: General;  Laterality: Right;   CESAREAN SECTION      COLONOSCOPY WITH PROPOFOL  N/A 09/11/2015   Procedure: COLONOSCOPY WITH PROPOFOL ;  Surgeon: Rogelia Copping, MD;  Location: [facility];  Service: Endoscopy;  Laterality: N/A;   IR IMAGING GUIDED PORT INSERTION  07/20/2019    Medical History: Past Medical History:  Diagnosis Date   Asthma    Cancer (HCC) 03/2020   right breast IMC   COPD exacerbation (HCC) 04/12/2016   Diabetes mellitus without complication (HCC)    Hyperlipemia    Hypertension    Personal history of chemotherapy 2021    Family History: Family History  Problem Relation Age of Onset   Diabetes Mother    Hypertension Mother    Hypertension Father    Diabetes Father    Breast cancer Neg Hx     Social History   Socioeconomic History   Marital status: Married    Spouse name: Not on file   Number of children: Not on file   Years of education: Not on file   Highest education level: Not on file  Occupational  History   Not on file  Tobacco Use   Smoking status: Former    Current packs/day: 0.00    Average packs/day: 1 pack/day for 2.0 years (2.0 ttl pk-yrs)    Types: Cigarettes    Start date: 02/28/2018    Quit date: 02/29/2020    Years since quitting: 4.0   Smokeless tobacco: Never   Tobacco comments:    Quit smoking last month  Substance and Sexual Activity   Alcohol use: Not Currently    Comment: social   Drug use: No   Sexual activity: Not Currently    Birth control/protection: Post-menopausal  Other Topics Concern   Not on file  Social History Narrative   Not on file   Social Drivers of Health   Financial Resource Strain: Low Risk  (10/24/2020)   Overall Financial Resource Strain (CARDIA)    Difficulty of Paying Living Expenses: Not very hard  Food Insecurity: No Food Insecurity (11/22/2023)   Hunger Vital Sign    Worried About Running Out of Food in the Last Year: Never true    Ran Out of Food in the Last Year: Never true  Transportation Needs: No Transportation Needs (11/22/2023)    PRAPARE - Administrator, Civil Service (Medical): No    Lack of Transportation (Non-Medical): No  Physical Activity: Not on file  Stress: Not on file  Social Connections: Not on file  Intimate Partner Violence: Not on file      Review of Systems  Constitutional:  Negative for chills, fatigue and unexpected weight change.  HENT:  Negative for congestion, rhinorrhea, sneezing and sore throat.   Eyes:  Negative for redness.  Respiratory:  Negative for cough, chest tightness and shortness of breath.   Cardiovascular:  Negative for chest pain and palpitations.  Gastrointestinal:  Negative for abdominal pain, constipation, diarrhea, nausea and vomiting.  Genitourinary:  Negative for dysuria and frequency.  Musculoskeletal:  Negative for back pain and neck pain.  Skin:  Negative for rash.  Neurological: Negative.  Negative for tremors and numbness.  Hematological:  Negative for adenopathy. Does not bruise/bleed easily.  Psychiatric/Behavioral:  Negative for behavioral problems (Depression), sleep disturbance and suicidal ideas. The patient is not nervous/anxious.     Vital Signs: BP 138/80 Comment: 148/86  Pulse 77   Temp (!) 96.3 F (35.7 C)   Resp 16   Ht 5' 1 (1.549 m)   Wt 179 lb 3.2 oz (81.3 kg)   SpO2 94%   BMI 33.86 kg/m    Physical Exam Vitals reviewed.  Constitutional:      Appearance: Normal appearance.  HENT:     Head: Normocephalic and atraumatic.  Eyes:     Pupils: Pupils are equal, round, and reactive to light.  Cardiovascular:     Rate and Rhythm: Normal rate and regular rhythm.  Pulmonary:     Effort: Pulmonary effort is normal. No respiratory distress.  Neurological:     Mental Status: She is alert and oriented to person, place, and time.  Psychiatric:        Mood and Affect: Mood normal.        Behavior: Behavior normal.        Assessment/Plan: 1. Chronic obstructive pulmonary disease with acute exacerbation (HCC)  (Primary) Continue breo ellipta  twice daily and continue prn albuterol .  - fluticasone  furoate-vilanterol (BREO ELLIPTA ) 100-25 MCG/ACT AEPB; Inhale 1 puff into the lungs daily.  Dispense: 60 each; Refill: 1 - albuterol  (VENTOLIN  HFA) 108 (90  Base) MCG/ACT inhaler; Inhale 2 puffs into the lungs every 6 (six) hours as needed for wheezing or shortness of breath.  Dispense: 8 g; Refill: 2  2. Type 2 diabetes mellitus with diabetic neuropathy, with long-term current use of insulin  (HCC) A1c is elevated at 8.5, continue medications as prescribed. Follow up in 4 months to recheck a1c - POCT glycosylated hemoglobin (Hb A1C) - empagliflozin  (JARDIANCE ) 25 MG TABS tablet; Take 1 tablet (25 mg total) by mouth daily.  Dispense: 90 tablet; Refill: 3 - Continuous Glucose Sensor (FREESTYLE LIBRE 3 PLUS SENSOR) MISC; Place sensor on to skin every 15 days to check glucose for CGM for diabetes E11.65  Dispense: 2 each; Refill: 11 - Continuous Glucose Receiver (FREESTYLE LIBRE 3 READER) DEVI; 1 Device by Does not apply route once for 1 dose.  Dispense: 1 each; Refill: 0  3. Hypertension associated with diabetes (HCC) Stable, continue lisinopril , amlodipine  and bisoprolol  as prescribed.  - amLODipine  (NORVASC ) 10 MG tablet; Take 1 tablet (10 mg total) by mouth daily.  Dispense: 90 tablet; Refill: 1 - bisoprolol  (ZEBETA ) 5 MG tablet; Take 1 tablet (5 mg total) by mouth daily.  Dispense: 90 tablet; Refill: 3 - lisinopril  (ZESTRIL ) 40 MG tablet; Take 1 tablet (40 mg total) by mouth daily.  Dispense: 90 tablet; Refill: 3  4. Hyperlipidemia associated with type 2 diabetes mellitus (HCC) Continue rosuvastatin  as prescribed  - rosuvastatin  (CRESTOR ) 10 MG tablet; Take 1 tablet (10 mg total) by mouth daily.  Dispense: 90 tablet; Refill: 1  5. Encounter for medication review Medication list reviewed, updated and refills ordered  - celecoxib  (CELEBREX ) 200 MG capsule; TAKE 1 CAPSULE BY MOUTH DAILY  FOR 1 WEEK AND THEN AS  NEEDED  Dispense: 100 capsule; Refill: 1 - allopurinol  (ZYLOPRIM ) 100 MG tablet; Take 1 tablet (100 mg total) by mouth at bedtime. Take 1 tablet by mouth at night for Gout  Dispense: 90 tablet; Refill: 3  6. Needs flu shot Flu vaccine administered in office today  - Influenza, MDCK, trivalent, PF(Flucelvax egg-free)   General Counseling: Melane verbalizes understanding of the findings of todays visit and agrees with plan of treatment. I have discussed any further diagnostic evaluation that may be needed or ordered today. We also reviewed her medications today. she has been encouraged to call the office with any questions or concerns that should arise related to todays visit.    Orders Placed This Encounter  Procedures   Influenza, MDCK, trivalent, PF(Flucelvax egg-free)   POCT glycosylated hemoglobin (Hb A1C)    Meds ordered this encounter  Medications   rosuvastatin  (CRESTOR ) 10 MG tablet    Sig: Take 1 tablet (10 mg total) by mouth daily.    Dispense:  90 tablet    Refill:  1   empagliflozin  (JARDIANCE ) 25 MG TABS tablet    Sig: Take 1 tablet (25 mg total) by mouth daily.    Dispense:  90 tablet    Refill:  3    Fill new script today, please send prior auth request if required.   Continuous Glucose Sensor (FREESTYLE LIBRE 3 PLUS SENSOR) MISC    Sig: Place sensor on to skin every 15 days to check glucose for CGM for diabetes E11.65    Dispense:  2 each    Refill:  11    Dx code E11.65, please send prior auth request if required asap.   Continuous Glucose Receiver (FREESTYLE LIBRE 3 READER) DEVI    Sig: 1 Device by Does not  apply route once for 1 dose.    Dispense:  1 each    Refill:  0    Dx code E11.65   fluticasone  furoate-vilanterol (BREO ELLIPTA ) 100-25 MCG/ACT AEPB    Sig: Inhale 1 puff into the lungs daily.    Dispense:  60 each    Refill:  1   celecoxib  (CELEBREX ) 200 MG capsule    Sig: TAKE 1 CAPSULE BY MOUTH DAILY  FOR 1 WEEK AND THEN AS NEEDED    Dispense:  100  capsule    Refill:  1    Please send a replace/new response with 100-Day Supply if appropriate to maximize member benefit. Requesting 1 year supply.   amLODipine  (NORVASC ) 10 MG tablet    Sig: Take 1 tablet (10 mg total) by mouth daily.    Dispense:  90 tablet    Refill:  1    Please note change in dose, patient is taking 1 whole tablet now instead of a half tablet. Please discontinue all previous amlodipine  orders   bisoprolol  (ZEBETA ) 5 MG tablet    Sig: Take 1 tablet (5 mg total) by mouth daily.    Dispense:  90 tablet    Refill:  3   allopurinol  (ZYLOPRIM ) 100 MG tablet    Sig: Take 1 tablet (100 mg total) by mouth at bedtime. Take 1 tablet by mouth at night for Gout    Dispense:  90 tablet    Refill:  3   albuterol  (VENTOLIN  HFA) 108 (90 Base) MCG/ACT inhaler    Sig: Inhale 2 puffs into the lungs every 6 (six) hours as needed for wheezing or shortness of breath.    Dispense:  8 g    Refill:  2   lisinopril  (ZESTRIL ) 40 MG tablet    Sig: Take 1 tablet (40 mg total) by mouth daily.    Dispense:  90 tablet    Refill:  3    Return in about 1 month (around 03/19/2024) for F/U, Chadric Kimberley PCP, eval new med.   Total time spent:30 Minutes Time spent includes review of chart, medications, test results, and follow up plan with the patient.   [place] Controlled Substance Database was reviewed by me.  This patient was seen by Mardy Maxin, FNP-C in collaboration with Dr. Sigrid Bathe as a part of collaborative care agreement.   Juanell Saffo R. Maxin, MSN, FNP-C Internal medicine

## 2024-02-24 ENCOUNTER — Telehealth: Payer: Self-pay

## 2024-02-24 DIAGNOSIS — C50411 Malignant neoplasm of upper-outer quadrant of right female breast: Secondary | ICD-10-CM

## 2024-02-24 DIAGNOSIS — Z95828 Presence of other vascular implants and grafts: Secondary | ICD-10-CM

## 2024-02-24 NOTE — Telephone Encounter (Signed)
 Dr. Babara has reviewed Mammogram results and is ok with port removal.   Request for port removal sent to IR.

## 2024-03-17 ENCOUNTER — Encounter: Payer: Self-pay | Admitting: Internal Medicine

## 2024-03-17 ENCOUNTER — Ambulatory Visit: Admitting: Internal Medicine

## 2024-03-17 ENCOUNTER — Telehealth: Payer: Self-pay | Admitting: Internal Medicine

## 2024-03-17 VITALS — BP 138/86 | HR 61 | Temp 97.9°F | Resp 16 | Ht 61.0 in | Wt 180.4 lb

## 2024-03-17 DIAGNOSIS — R0602 Shortness of breath: Secondary | ICD-10-CM

## 2024-03-17 DIAGNOSIS — I700 Atherosclerosis of aorta: Secondary | ICD-10-CM

## 2024-03-17 DIAGNOSIS — E1159 Type 2 diabetes mellitus with other circulatory complications: Secondary | ICD-10-CM | POA: Diagnosis not present

## 2024-03-17 DIAGNOSIS — R7301 Impaired fasting glucose: Secondary | ICD-10-CM

## 2024-03-17 DIAGNOSIS — J441 Chronic obstructive pulmonary disease with (acute) exacerbation: Secondary | ICD-10-CM

## 2024-03-17 DIAGNOSIS — C50411 Malignant neoplasm of upper-outer quadrant of right female breast: Secondary | ICD-10-CM | POA: Diagnosis not present

## 2024-03-17 DIAGNOSIS — Z23 Encounter for immunization: Secondary | ICD-10-CM

## 2024-03-17 DIAGNOSIS — Z171 Estrogen receptor negative status [ER-]: Secondary | ICD-10-CM

## 2024-03-17 LAB — POCT CBG (FASTING - GLUCOSE)-MANUAL ENTRY: Glucose Fasting, POC: 225 mg/dL — AB (ref 70–99)

## 2024-03-17 MED ORDER — IPRATROPIUM-ALBUTEROL 0.5-2.5 (3) MG/3ML IN SOLN
3.0000 mL | Freq: Once | RESPIRATORY_TRACT | Status: AC
  Administered 2024-03-17: 3 mL via RESPIRATORY_TRACT

## 2024-03-17 NOTE — Progress Notes (Addendum)
 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   allopurinol  (ZYLOPRIM ) 100 MG tablet Take 1 tablet (100 mg total) by mouth at bedtime. Take 1 tablet by mouth at night for Gout   amLODipine  (NORVASC ) 10 MG tablet Take 1 tablet (10 mg total) by mouth daily.   bisoprolol  (ZEBETA ) 5 MG tablet Take 1 tablet (5 mg total) by mouth daily.   brimonidine  (ALPHAGAN ) 0.2 % ophthalmic solution INSTILL 1 DROP INTO BOTH EYES  TWICE DAILY   Calcium  Carb-Cholecalciferol  (CALCIUM  600 + D) 600-200 MG-UNIT TABS Take 1 tablet by mouth daily.   celecoxib  (CELEBREX ) 200 MG capsule TAKE 1 CAPSULE BY MOUTH DAILY  FOR 1 WEEK AND THEN AS NEEDED   colchicine  0.6 MG tablet TAKE FOR A GOUT FLARE/ATTACK:  TAKE 2 TABLETS BY MOUTH THEN  TAKE 1 MORE TABLET 1 HOUR LATER  ON DAY 1, THEN TAKE 1 TABLET BY  MOUTH DAILY UNTIL IT RESOLVES   Continuous Glucose Sensor (FREESTYLE LIBRE 3 PLUS SENSOR) MISC Place sensor on to skin every 15 days to check glucose for CGM for diabetes E11.65   empagliflozin  (JARDIANCE ) 25 MG TABS tablet Take 1 tablet (25 mg total) by mouth daily.   fluticasone  furoate-vilanterol (BREO ELLIPTA )  100-25 MCG/ACT AEPB Inhale 1 puff into the lungs daily.   gabapentin  (NEURONTIN ) 300 MG capsule Take 2 capsule by mouth in the morning, 1 capsule by mouth at lunch/midday, and 3 capsule by mouth at bedtime.   lidocaine -prilocaine  (EMLA ) cream Apply topically daily as needed. Apply small amount to port and cover with saran wrap 1-2 hours prior to port access   lisinopril  (ZESTRIL ) 40 MG tablet Take 1 tablet (40 mg total) by mouth daily.   rosuvastatin  (CRESTOR ) 10 MG tablet Take 1 tablet (10 mg total) by mouth daily.   [DISCONTINUED] Accu-Chek Softclix Lancets lancets Use 1 lancet to check glucose once daily and as needed for diabetes (Patient not taking: Reported on 02/16/2024)   [DISCONTINUED] Alcohol Swabs (B-D SINGLE USE SWABS REGULAR) PADS Use as directed twice a day (Patient not taking: Reported on 02/16/2024)   [DISCONTINUED] Blood Glucose Monitoring Suppl DEVI 1 each by Does not apply route in the morning, at noon, and at bedtime. May substitute to any manufacturer covered by patient's insurance. (Patient not taking: Reported on 02/16/2024)   Facility-Administered Encounter Medications as of 03/17/2024  Medication   heparin  lock flush 100 UNIT/ML injection   [COMPLETED] ipratropium-albuterol  (DUONEB) 0.5-2.5 (3) MG/3ML nebulizer solution 3 mL   sodium chloride  flush (NS) 0.9 % injection 10 mL    Surgical History: Past Surgical History:  Procedure Laterality Date  BREAST BIOPSY Right 07/05/2019   us  bx venus marker, grade 3 invasive mammary carcinoma   BREAST BIOPSY Right 07/05/2019   LN bx, hydromarker,PREDOMINANTLY BLOOD AND FIBROADIPOSE TISSUE, WITH SCANT LYMPHOID TISSUE PRESENT       BREAST LUMPECTOMY Right 05/04/2020   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   BREAST LUMPECTOMY WITH RADIOACTIVE SEED AND SENTINEL LYMPH NODE BIOPSY Right 05/04/2020   Procedure: RIGHT BREAST LUMPECTOMY WITH BRACKETED RADIOACTIVE SEED AND SENTINEL LYMPH NODE BIOPSY;   Surgeon: Curvin Deward MOULD, MD;  Location: [facility];  Service: General;  Laterality: Right;   CESAREAN SECTION     COLONOSCOPY WITH PROPOFOL  N/A 09/11/2015   Procedure: COLONOSCOPY WITH PROPOFOL ;  Surgeon: Rogelia Copping, MD;  Location: [facility];  Service: Endoscopy;  Laterality: N/A;   IR IMAGING GUIDED PORT INSERTION  07/20/2019    Medical History: Past Medical History:  Diagnosis Date   Asthma    Cancer (HCC) 03/2020   right breast IMC   COPD exacerbation (HCC) 04/12/2016   Diabetes mellitus without complication (HCC)    Hyperlipemia    Hypertension    Personal history of chemotherapy 2021    Family History: Family History  Problem Relation Age of Onset   Diabetes Mother    Hypertension Mother    Hypertension Father    Diabetes Father    Breast cancer Neg Hx     Social History   Socioeconomic History   Marital status: Married    Spouse name: Not on file   Number of children: Not on file   Years of education: Not on file   Highest education level: Not on file  Occupational History   Not on file  Tobacco Use   Smoking status: Former    Current packs/day: 0.00    Average packs/day: 1 pack/day for 2.0 years (2.0 ttl pk-yrs)    Types: Cigarettes    Start date: 02/28/2018    Quit date: 02/29/2020    Years since quitting: 4.0   Smokeless tobacco: Never   Tobacco comments:    Quit smoking last month  Substance and Sexual Activity   Alcohol use: Not Currently    Comment: social   Drug use: No   Sexual activity: Not Currently    Birth control/protection: Post-menopausal  Other Topics Concern   Not on file  Social History Narrative   Not on file   Social Drivers of Health   Financial Resource Strain: Low Risk  (10/24/2020)   Overall Financial Resource Strain (CARDIA)    Difficulty of Paying Living Expenses: Not very hard  Food Insecurity: No Food Insecurity (11/22/2023)   Hunger Vital Sign    Worried About Running Out of Food in the Last Year:  Never true    Ran Out of Food in the Last Year: Never true  Transportation Needs: No Transportation Needs (11/22/2023)   PRAPARE - Administrator, Civil Service (Medical): No    Lack of Transportation (Non-Medical): No  Physical Activity: Not on file  Stress: Not on file  Social Connections: Not on file  Intimate Partner Violence: Not on file      Review of Systems  Constitutional:  Negative for chills, fatigue and unexpected weight change.  HENT:  Positive for postnasal drip. Negative for congestion, rhinorrhea, sneezing and sore throat.   Eyes:  Negative for redness.  Respiratory:  Positive for cough and shortness of breath. Negative for chest tightness.   Cardiovascular:  Negative for chest pain and palpitations.  Gastrointestinal:  Negative for abdominal pain, constipation, diarrhea, nausea and vomiting.  Genitourinary:  Negative for dysuria and frequency.  Musculoskeletal:  Negative for arthralgias, back pain, joint swelling and neck pain.  Skin:  Negative for rash.  Neurological: Negative.  Negative for tremors and numbness.  Hematological:  Negative for adenopathy. Does not bruise/bleed easily.  Psychiatric/Behavioral:  Negative for behavioral problems (Depression), sleep disturbance and suicidal ideas. The patient is not nervous/anxious.     Vital Signs: BP 138/86   Pulse 61   Temp 97.9 F (36.6 C)   Resp 16   Ht 5' 1 (1.549 m)   Wt 180 lb 6.4 oz (81.8 kg)   SpO2 97%   PF (!) 3 L/min   BMI 34.09 kg/m    Physical Exam Constitutional:      Appearance: Normal appearance.  HENT:     Head: Normocephalic and atraumatic.     Nose: Nose normal.     Mouth/Throat:     Mouth: Mucous membranes are moist.     Pharynx: No posterior oropharyngeal erythema.  Eyes:     Extraocular Movements: Extraocular movements intact.     Pupils: Pupils are equal, round, and reactive to light.  Cardiovascular:     Pulses: Normal pulses.     Heart sounds: Normal heart  sounds.  Pulmonary:     Effort: Pulmonary effort is normal.     Breath sounds: Wheezing present.     Comments: Decreased bs bilateral  Neurological:     General: No focal deficit present.     Mental Status: She is alert.  Psychiatric:        Mood and Affect: Mood normal.        Behavior: Behavior normal.        Assessment/Plan: 1. 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 Acute exacerbation of chronic obstructive pulmonary disease (COPD) (HCC) Pt needs PFT, will need CT chest as well if not done in the past  - Spirometry with Graph - ipratropium-albuterol  (DUONEB) 0.5-2.5 (3) MG/3ML nebulizer solution 3 mL  3. Need for shingles vaccine - Zoster, Recombinant (Shingrix)  4. Malignant neoplasm of upper-outer quadrant of right breast in female, estrogen receptor negative (HCC) Followed by oncology, will look into her mammogram since she has missed her app    General Counseling: Katiejo verbalizes understanding of the findings of todays visit and agrees with plan of treatment. I have discussed any further diagnostic evaluation that may be needed or ordered today. We also reviewed her medications today. she has been encouraged to call the office with any questions or concerns that should arise related to todays visit.    Orders Placed This Encounter  Procedures   Zoster, Recombinant (Shingrix)   POCT CBG (Fasting - Glucose)   Spirometry with Graph    Meds ordered this encounter  Medications   ipratropium-albuterol  (DUONEB) 0.5-2.5 (3) MG/3ML nebulizer solution 3 mL    Total time spent:35 Minutes Time spent includes review of chart, medications, test results, and follow up plan with the patient.   [place] Controlled Substance Database was reviewed by me.   Dr Chales Pelissier M Aarya Robinson Internal medicine

## 2024-03-17 NOTE — Telephone Encounter (Signed)
 Sent message to patient to move 10/28 9:00 appointment to 10:00-Toni

## 2024-03-24 ENCOUNTER — Other Ambulatory Visit (HOSPITAL_COMMUNITY): Payer: Self-pay

## 2024-03-27 ENCOUNTER — Encounter: Payer: Self-pay | Admitting: Nurse Practitioner

## 2024-03-29 ENCOUNTER — Encounter: Payer: Self-pay | Admitting: Internal Medicine

## 2024-03-29 ENCOUNTER — Ambulatory Visit: Admitting: Internal Medicine

## 2024-03-29 ENCOUNTER — Telehealth: Payer: Self-pay

## 2024-03-29 VITALS — BP 141/81 | HR 76 | Temp 98.0°F | Resp 16 | Ht 61.0 in | Wt 175.6 lb

## 2024-03-29 DIAGNOSIS — F819 Developmental disorder of scholastic skills, unspecified: Secondary | ICD-10-CM

## 2024-03-29 DIAGNOSIS — I152 Hypertension secondary to endocrine disorders: Secondary | ICD-10-CM

## 2024-03-29 DIAGNOSIS — E1159 Type 2 diabetes mellitus with other circulatory complications: Secondary | ICD-10-CM

## 2024-03-29 DIAGNOSIS — J438 Other emphysema: Secondary | ICD-10-CM

## 2024-03-29 DIAGNOSIS — J441 Chronic obstructive pulmonary disease with (acute) exacerbation: Secondary | ICD-10-CM

## 2024-03-29 DIAGNOSIS — E1169 Type 2 diabetes mellitus with other specified complication: Secondary | ICD-10-CM

## 2024-03-29 DIAGNOSIS — C50411 Malignant neoplasm of upper-outer quadrant of right female breast: Secondary | ICD-10-CM

## 2024-03-29 DIAGNOSIS — Z171 Estrogen receptor negative status [ER-]: Secondary | ICD-10-CM

## 2024-03-29 MED ORDER — FLUTICASONE FUROATE-VILANTEROL 100-25 MCG/ACT IN AEPB: 1.0000 | INHALATION_SPRAY | Freq: Every day | RESPIRATORY_TRACT | 3 refills | Status: AC

## 2024-03-29 NOTE — Progress Notes (Signed)
 [facility] [address] [place], [place] [address]  Internal MEDICINE  Office Visit Note  Patient Name: Dawn Alvarez  [id number]  [id number]  Date of Service: 03/29/2024  Chief Complaint  Patient presents with   Follow-up    Diabetes, HTN, Hyperlipidemia    HPI Pt is seen for routine follow up Patient is seen today about confusion regarding her medications, she is also unable to use her glucometer She brought multiple medications today from home however the list has more missing medications Patient is unable to read or write and has difficulty understanding medications She has a helper at home but he is not there all the time Patient also brought her Breo inhaler, and has difficulty using it she is also on albuterol  Janumet  50/1,000 was given to the patient to from the hospital but she is not taking it, patient is on Jardiance  as well for diabetes control    Current Medication: Outpatient Encounter Medications as of 03/29/2024  Medication Sig   albuterol  (VENTOLIN  HFA) 108 (90 Base) MCG/ACT inhaler Inhale 2 puffs into the lungs every 6 (six) hours as needed for wheezing or shortness of breath.   amLODipine  (NORVASC ) 10 MG tablet Take 1 tablet (10 mg total) by mouth daily.   bisoprolol  (ZEBETA ) 5 MG tablet Take 1 tablet (5 mg total) by mouth daily.   brimonidine  (ALPHAGAN ) 0.2 % ophthalmic solution INSTILL 1 DROP INTO BOTH EYES  TWICE DAILY   empagliflozin  (JARDIANCE ) 25 MG TABS tablet Take 1 tablet (25 mg total) by mouth daily.   lidocaine -prilocaine  (EMLA ) cream Apply topically daily as needed. Apply small amount to port and cover with saran wrap 1-2 hours prior to port access   lisinopril  (ZESTRIL ) 40 MG tablet Take 1 tablet (40 mg total) by mouth daily.   rosuvastatin  (CRESTOR ) 10 MG tablet Take 1 tablet (10 mg total) by mouth daily.   [DISCONTINUED] allopurinol  (ZYLOPRIM ) 100 MG tablet Take 1 tablet (100 mg total) by mouth at bedtime. Take 1 tablet by mouth  at night for Gout   [DISCONTINUED] Calcium  Carb-Cholecalciferol  (CALCIUM  600 + D) 600-200 MG-UNIT TABS Take 1 tablet by mouth daily.   [DISCONTINUED] celecoxib  (CELEBREX ) 200 MG capsule TAKE 1 CAPSULE BY MOUTH DAILY  FOR 1 WEEK AND THEN AS NEEDED   [DISCONTINUED] colchicine  0.6 MG tablet TAKE FOR A GOUT FLARE/ATTACK:  TAKE 2 TABLETS BY MOUTH THEN  TAKE 1 MORE TABLET 1 HOUR LATER  ON DAY 1, THEN TAKE 1 TABLET BY  MOUTH DAILY UNTIL IT RESOLVES   [DISCONTINUED] Continuous Glucose Sensor (FREESTYLE LIBRE 3 PLUS SENSOR) MISC Place sensor on to skin every 15 days to check glucose for CGM for diabetes E11.65   [DISCONTINUED] fluticasone  furoate-vilanterol (BREO ELLIPTA ) 100-25 MCG/ACT AEPB Inhale 1 puff into the lungs daily.   [DISCONTINUED] gabapentin  (NEURONTIN ) 300 MG capsule Take 2 capsule by mouth in the morning, 1 capsule by mouth at lunch/midday, and 3 capsule by mouth at bedtime.   fluticasone  furoate-vilanterol (BREO ELLIPTA ) 100-25 MCG/ACT AEPB Inhale 1 puff into the lungs daily.   [DISCONTINUED] heparin  lock flush 100 UNIT/ML injection    [DISCONTINUED] sodium chloride  flush (NS) 0.9 % injection 10 mL    No facility-administered encounter medications on file as of 03/29/2024.    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   BREAST LUMPECTOMY WITH RADIOACTIVE SEED AND SENTINEL LYMPH NODE BIOPSY Right 05/04/2020   Procedure: RIGHT BREAST LUMPECTOMY WITH BRACKETED RADIOACTIVE SEED AND SENTINEL LYMPH NODE BIOPSY;  Surgeon: Curvin Deward MOULD, MD;  Location: [facility];  Service: General;  Laterality: Right;   CESAREAN  SECTION     COLONOSCOPY WITH PROPOFOL  N/A 09/11/2015   Procedure: COLONOSCOPY WITH PROPOFOL ;  Surgeon: Rogelia Copping, MD;  Location: [facility];  Service: Endoscopy;  Laterality: N/A;   IR IMAGING GUIDED PORT INSERTION  07/20/2019    Medical History: Past Medical History:  Diagnosis Date   Asthma    Cancer (HCC) 03/2020   right breast IMC   COPD exacerbation (HCC) 04/12/2016   Diabetes mellitus without complication (HCC)    Hyperlipemia    Hypertension    Personal history of chemotherapy 2021    Family History: Family History  Problem Relation Age of Onset   Diabetes Mother    Hypertension Mother    Hypertension Father    Diabetes Father    Breast cancer Neg Hx     Social History   Socioeconomic History   Marital status: Married    Spouse name: Not on file   Number of children: Not on file   Years of education: Not on file   Highest education level: Not on file  Occupational History   Not on file  Tobacco Use   Smoking status: Former    Current packs/day: 0.00    Average packs/day: 1 pack/day for 2.0 years (2.0 ttl pk-yrs)    Types: Cigarettes    Start date: 02/28/2018    Quit date: 02/29/2020    Years since quitting: 4.0   Smokeless tobacco: Never   Tobacco comments:    Quit smoking last month  Substance and Sexual Activity   Alcohol use: Not Currently    Comment: social   Drug use: No   Sexual activity: Not Currently    Birth control/protection: Post-menopausal  Other Topics Concern   Not on file  Social History Narrative   Not on file   Social Drivers of Health   Financial Resource Strain: Low Risk  (10/24/2020)   Overall Financial Resource Strain (CARDIA)    Difficulty of Paying Living Expenses: Not very hard  Food Insecurity: No Food Insecurity (11/22/2023)   Hunger Vital Sign    Worried About Running Out of Food in the Last Year: Never true    Ran Out of Food in the Last Year: Never true  Transportation Needs: No Transportation Needs (11/22/2023)    PRAPARE - Administrator, Civil Service (Medical): No    Lack of Transportation (Non-Medical): No  Physical Activity: Not on file  Stress: Not on file  Social Connections: Not on file  Intimate Partner Violence: Not on file      Review of Systems  Constitutional:  Negative for chills, fatigue and unexpected weight change.  HENT:  Positive for postnasal drip. Negative for congestion, rhinorrhea, sneezing and sore throat.   Eyes:  Negative for redness.  Respiratory:  Negative for cough, chest tightness and shortness of breath.   Cardiovascular:  Negative for chest pain and palpitations.  Gastrointestinal:  Negative for abdominal pain, constipation, diarrhea, nausea and vomiting.  Genitourinary:  Negative for dysuria and frequency.  Musculoskeletal:  Negative  for arthralgias, back pain, joint swelling and neck pain.  Skin:  Negative for rash.  Neurological: Negative.  Negative for tremors and numbness.  Hematological:  Negative for adenopathy. Does not bruise/bleed easily.  Psychiatric/Behavioral:  Negative for behavioral problems (Depression), sleep disturbance and suicidal ideas. The patient is not nervous/anxious.     Vital Signs: BP (!) 141/81   Pulse 76   Temp 98 F (36.7 C)   Resp 16   Ht 5' 1 (1.549 m)   Wt 175 lb 9.6 oz (79.7 kg)   SpO2 96%   BMI 33.18 kg/m    Physical Exam Constitutional:      Appearance: Normal appearance.  HENT:     Head: Normocephalic and atraumatic.     Nose: Nose normal.     Mouth/Throat:     Mouth: Mucous membranes are moist.     Pharynx: No posterior oropharyngeal erythema.  Eyes:     Extraocular Movements: Extraocular movements intact.     Pupils: Pupils are equal, round, and reactive to light.  Cardiovascular:     Pulses: Normal pulses.     Heart sounds: Normal heart sounds.  Pulmonary:     Effort: Pulmonary effort is normal.     Breath sounds: Wheezing present.  Neurological:     General: No focal deficit  present.     Mental Status: She is alert.  Psychiatric:        Mood and Affect: Mood normal.        Behavior: Behavior normal.        Assessment/Plan: 1. Type 2 diabetes mellitus with other specified complication, without long-term current use of insulin  (HCC) (Primary) Patient continues to be confused about her medications as she has a prescription of Janumet  and Jardiance  however the antibiotic this fall, we need to look into pill pack for her through home health, patient is also unable to check her blood sugar due to lack of understanding of her glucometer - Ambulatory referral to Home Health  2. Other emphysema (HCC) Unable to use Breo as advised due to lack of understanding she is also on albuterol  - Ambulatory referral to Home Health  3. Learning difficulty Will involve home health to help manage medication dispensing and teaching to the patient if possible - Ambulatory referral to Home Health  4. Malignant neoplasm of upper-outer quadrant of right breast in female, estrogen receptor negative (HCC) Followed by oncology  5. Hypertension associated with diabetes [facility]) Patient has multiple prescriptions of 4 antihypertensives, we will not add or change anything until we involve home health - Ambulatory referral to Home Health  6. Chronic obstructive pulmonary disease with acute exacerbation (HCC) - fluticasone  furoate-vilanterol (BREO ELLIPTA ) 100-25 MCG/ACT AEPB; Inhale 1 puff into the lungs daily.  Dispense: 60 each; Refill: 3  General Counseling: Alilah verbalizes understanding of the findings of todays visit and agrees with plan of treatment. I have discussed any further diagnostic evaluation that may be needed or ordered today. We also reviewed her medications today. she has been encouraged to call the office with any questions or concerns that should arise related to todays visit.    Orders Placed This Encounter  Procedures   Ambulatory referral to Home Health     Meds ordered this encounter  Medications   fluticasone  furoate-vilanterol (BREO ELLIPTA ) 100-25 MCG/ACT AEPB    Sig: Inhale 1 puff into the lungs daily.    Dispense:  60 each    Refill:  3    Total time  spent:45 Minutes Time spent includes review of chart, medications, test results, and follow up plan with the patient.   [place] Controlled Substance Database was reviewed by me.   Dr Morgane Joerger M Jinna Weinman Internal medicine

## 2024-03-29 NOTE — Telephone Encounter (Signed)
 Sent message [facility] waiting for respond

## 2024-03-31 ENCOUNTER — Telehealth: Payer: Self-pay

## 2024-03-31 NOTE — Telephone Encounter (Signed)
 Faxed [facility] home health referral [phone]

## 2024-04-05 ENCOUNTER — Ambulatory Visit: Admitting: Internal Medicine

## 2024-04-05 ENCOUNTER — Encounter: Payer: Self-pay | Admitting: Internal Medicine

## 2024-04-05 VITALS — BP 155/80 | HR 73 | Temp 98.0°F | Resp 16 | Ht 61.0 in | Wt 177.0 lb

## 2024-04-05 DIAGNOSIS — M1A09X0 Idiopathic chronic gout, multiple sites, without tophus (tophi): Secondary | ICD-10-CM

## 2024-04-05 DIAGNOSIS — E1165 Type 2 diabetes mellitus with hyperglycemia: Secondary | ICD-10-CM

## 2024-04-05 DIAGNOSIS — E785 Hyperlipidemia, unspecified: Secondary | ICD-10-CM

## 2024-04-05 DIAGNOSIS — J441 Chronic obstructive pulmonary disease with (acute) exacerbation: Secondary | ICD-10-CM | POA: Diagnosis not present

## 2024-04-05 DIAGNOSIS — E1159 Type 2 diabetes mellitus with other circulatory complications: Secondary | ICD-10-CM

## 2024-04-05 DIAGNOSIS — I152 Hypertension secondary to endocrine disorders: Secondary | ICD-10-CM

## 2024-04-05 DIAGNOSIS — E1169 Type 2 diabetes mellitus with other specified complication: Secondary | ICD-10-CM | POA: Diagnosis not present

## 2024-04-05 DIAGNOSIS — F819 Developmental disorder of scholastic skills, unspecified: Secondary | ICD-10-CM

## 2024-04-05 LAB — GLUCOSE, POCT (MANUAL RESULT ENTRY): POC Glucose: 159 mg/dL — AB (ref 70–99)

## 2024-04-05 MED ORDER — JANUMET XR 100-1000 MG PO TB24: ORAL_TABLET | ORAL | 3 refills | Status: AC

## 2024-04-05 MED ORDER — ALLOPURINOL 300 MG PO TABS: ORAL_TABLET | ORAL | 3 refills | Status: AC

## 2024-04-05 NOTE — Addendum Note (Signed)
 Addended by: FERNAND SIGRID HERO on: 04/05/2024 05:52 PM   Modules accepted: Orders

## 2024-04-05 NOTE — Progress Notes (Addendum)
 [facility] [address] [place], [place] [address]  Internal MEDICINE  Office Visit Note  Patient Name: Dawn Alvarez  [id number]  [id number]  Date of Service: 04/05/2024  Chief Complaint  Patient presents with   Follow-up    Diabetes, HTN, Hyperlipidemia    HPI Pt is seen for routine follow up She was able to bring all her medications  Pt is on 3 different antihypertensives  She is on 2 antidiabetics  She is on Allopurinol  colchicine  and Celebrex   She is on Breo and albuterol  for copd  Crestor  for HLD      Current Medication: Outpatient Encounter Medications as of 04/05/2024  Medication Sig   albuterol  (VENTOLIN  HFA) 108 (90 Base) MCG/ACT inhaler Inhale 2 puffs into the lungs every 6 (six) hours as needed for wheezing or shortness of breath.   allopurinol  (ZYLOPRIM ) 300 MG tablet Take one tab po at bedtime for gout   amLODipine  (NORVASC ) 10 MG tablet Take 1 tablet (10 mg total) by mouth daily.   bisoprolol  (ZEBETA ) 5 MG tablet Take 1 tablet (5 mg total) by mouth daily.   empagliflozin  (JARDIANCE ) 25 MG TABS tablet Take 1 tablet (25 mg total) by mouth daily.   fluticasone  furoate-vilanterol (BREO ELLIPTA ) 100-25 MCG/ACT AEPB Inhale 1 puff into the lungs daily.   lisinopril  (ZESTRIL ) 40 MG tablet Take 1 tablet (40 mg total) by mouth daily.   rosuvastatin  (CRESTOR ) 10 MG tablet Take 1 tablet (10 mg total) by mouth daily.   SitaGLIPtin-MetFORMIN HCl (JANUMET  XR) [phone] MG TB24 Take one tab po every day for diabetes   [DISCONTINUED] brimonidine  (ALPHAGAN ) 0.2 % ophthalmic solution INSTILL 1 DROP INTO BOTH EYES  TWICE DAILY   [DISCONTINUED] lidocaine -prilocaine  (EMLA ) cream Apply topically daily as needed. Apply small amount to port and cover with saran wrap 1-2 hours prior to port access   No facility-administered encounter medications on file as of 04/05/2024.    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   BREAST LUMPECTOMY WITH RADIOACTIVE SEED AND SENTINEL LYMPH NODE BIOPSY Right 05/04/2020   Procedure: RIGHT BREAST LUMPECTOMY WITH BRACKETED RADIOACTIVE SEED AND SENTINEL LYMPH NODE BIOPSY;  Surgeon: Curvin Deward MOULD, MD;  Location: [facility];  Service: General;  Laterality: Right;   CESAREAN SECTION     COLONOSCOPY WITH PROPOFOL  N/A 09/11/2015   Procedure: COLONOSCOPY WITH PROPOFOL ;  Surgeon: Rogelia Copping, MD;  Location: [facility];  Service: Endoscopy;  Laterality: N/A;   IR IMAGING GUIDED PORT INSERTION  07/20/2019    Medical History: Past Medical History:  Diagnosis Date   Asthma    Cancer (HCC) 03/2020   right breast IMC   COPD exacerbation (HCC) 04/12/2016   Diabetes mellitus without complication (HCC)    Hyperlipemia    Hypertension    Personal history of chemotherapy 2021    Family History: Family History  Problem Relation Age of Onset   Diabetes Mother    Hypertension Mother    Hypertension Father    Diabetes Father    Breast cancer Neg Hx     Social History   Socioeconomic History   Marital status:  Married    Spouse name: Not on file   Number of children: Not on file   Years of education: Not on file   Highest education level: Not on file  Occupational History   Not on file  Tobacco Use   Smoking status: Former    Current packs/day: 0.00    Average packs/day: 1 pack/day for 2.0 years (2.0 ttl pk-yrs)    Types: Cigarettes    Start date: 02/28/2018    Quit date: 02/29/2020    Years since quitting: 4.1   Smokeless tobacco: Never   Tobacco comments:    Quit smoking last month  Substance and Sexual Activity   Alcohol use: Not  Currently    Comment: social   Drug use: No   Sexual activity: Not Currently    Birth control/protection: Post-menopausal  Other Topics Concern   Not on file  Social History Narrative   Not on file   Social Drivers of Health   Financial Resource Strain: Low Risk  (10/24/2020)   Overall Financial Resource Strain (CARDIA)    Difficulty of Paying Living Expenses: Not very hard  Food Insecurity: No Food Insecurity (11/22/2023)   Hunger Vital Sign    Worried About Running Out of Food in the Last Year: Never true    Ran Out of Food in the Last Year: Never true  Transportation Needs: No Transportation Needs (11/22/2023)   PRAPARE - Transportation    Lack of Transportation (Medical): No    Lack of Transportation (Non-Medical): No  Physical Activity: Not on file  Stress: Not on file  Social Connections: Not on file  Intimate Partner Violence: Not on file      Review of Systems  Constitutional:  Negative for chills, fatigue and unexpected weight change.  HENT:  Positive for postnasal drip. Negative for congestion, rhinorrhea, sneezing and sore throat.   Eyes:  Negative for redness.  Respiratory:  Negative for cough, chest tightness and shortness of breath.   Cardiovascular:  Negative for chest pain and palpitations.  Gastrointestinal:  Negative for abdominal pain, constipation, diarrhea, nausea and vomiting.  Genitourinary:  Negative for dysuria and frequency.  Musculoskeletal:  Negative for arthralgias, back pain, joint swelling and neck pain.  Skin:  Negative for rash.  Neurological: Negative.  Negative for tremors and numbness.  Hematological:  Negative for adenopathy. Does not bruise/bleed easily.  Psychiatric/Behavioral:  Negative for behavioral problems (Depression), sleep disturbance and suicidal ideas. The patient is not nervous/anxious.     Vital Signs: BP (!) 155/80   Pulse 73   Temp 98 F (36.7 C)   Resp 16   Ht 5' 1 (1.549 m)   Wt 177 lb (80.3 kg)   SpO2 98%    BMI 33.44 kg/m   Physical Exam HENT:     Head: Normocephalic and atraumatic.  Cardiovascular:     Rate and Rhythm: Regular rhythm.  Musculoskeletal:        General: Normal range of motion.  Skin:    General: Skin is warm.  Neurological:     Mental Status: She is alert.         Assessment/Plan: 1. Type 2 diabetes mellitus with hyperglycemia, without long-term current use of insulin  (HCC) (Primary) Continue Jardiance  25 mg po every day, change Janumet  XT 100/1000 mg po every day  - POCT Glucose (CBG)  2. Hypertension associated with diabetes (HCC) Lisinopril , bisoprolol  and norvasc  every day   3. Chronic obstructive pulmonary disease with acute exacerbation (HCC) Breo and  Albuterol    4. Learning difficulty Home health for pill pocket  5. Hyperlipidemia associated with type 2 diabetes mellitus (HCC) Crestor  10 mg po every day   6. Idiopathic chronic gout of multiple sites without tophus Colchicine , Celebrex , increased Allopurinol  to 300 mg po every day    General Counseling: Denya verbalizes understanding of the findings of todays visit and agrees with plan of treatment. I have discussed any further diagnostic evaluation that may be needed or ordered today. We also reviewed her medications today. she has been encouraged to call the office with any questions or concerns that should arise related to todays visit.    Orders Placed This Encounter  Procedures   POCT Glucose (CBG)   Pulmonary function test    Meds ordered this encounter  Medications   allopurinol  (ZYLOPRIM ) 300 MG tablet    Sig: Take one tab po at bedtime for gout    Dispense:  90 tablet    Refill:  3   SitaGLIPtin-MetFORMIN HCl (JANUMET  XR) [phone] MG TB24    Sig: Take one tab po every day for diabetes    Dispense:  30 tablet    Refill:  3    Total time spent:35 Minutes Time spent includes review of chart, medications, test results, and follow up plan with the patient.   [place] Controlled  Substance Database was reviewed by me.   Dr Deashia Soule M Kamila Broda Internal medicine

## 2024-04-06 ENCOUNTER — Telehealth: Payer: Self-pay

## 2024-04-06 NOTE — Telephone Encounter (Signed)
 Spoke with [facility], confirmed and gave verbal orders for nursing and medication management.

## 2024-04-07 NOTE — Telephone Encounter (Signed)
 Called pt to offer appt for Port removal but no answer and unable to leave VM. Called pt's daughter Darryle and informed her that there was an appt available tomorrow, but we will not book appt until we hear back from pt. She said she will try to reach out to pt. Provided her with IR number so pt can call and schedule port placement

## 2024-04-11 ENCOUNTER — Other Ambulatory Visit: Payer: Self-pay

## 2024-04-11 ENCOUNTER — Emergency Department
Admission: EM | Admit: 2024-04-11 | Discharge: 2024-04-11 | Disposition: A | Discharge status: Home / Self Care | Attending: Emergency Medicine | Admitting: Emergency Medicine

## 2024-04-11 DIAGNOSIS — M545 Low back pain, unspecified: Secondary | ICD-10-CM | POA: Insufficient documentation

## 2024-04-11 DIAGNOSIS — I10 Essential (primary) hypertension: Secondary | ICD-10-CM | POA: Diagnosis not present

## 2024-04-11 DIAGNOSIS — E119 Type 2 diabetes mellitus without complications: Secondary | ICD-10-CM | POA: Insufficient documentation

## 2024-04-11 DIAGNOSIS — J449 Chronic obstructive pulmonary disease, unspecified: Secondary | ICD-10-CM | POA: Diagnosis not present

## 2024-04-11 MED ORDER — CYCLOBENZAPRINE HCL 5 MG PO TABS: 5.0000 mg | ORAL_TABLET | Freq: Three times a day (TID) | ORAL | 0 refills | Status: AC | PRN

## 2024-04-11 NOTE — ED Triage Notes (Signed)
 Denies injury. C/O sacral pain since Friday.

## 2024-04-11 NOTE — ED Provider Notes (Signed)
 [facility] Provider Note   Event Date/Time   First MD Initiated Contact with Patient 04/11/24 1246     (approximate) History  Tailbone Pain  HPI Dawn Alvarez is a 68 y.o. female with a stated past medical history of COPD, hyperlipidemia, hypertension, and type 2 diabetes who presents complaining of right lower lumbar paraspinal pain that she states began yesterday after getting up from sitting on the porch.  Patient states that when she went back inside she began feeling worsening pain in this right lower lumbar paraspinal region.  Patient states that when she woke up this morning with pain was significantly worse and she called her son to bring her to the emergency department.  Patient was initially evaluated at [facility] clinic and sent here with concerns for possible spinal injury.  Patient denies any trauma to this area, pain radiating down either leg, bowel/bladder incontinence, saddle anesthesia, or difficulty with ambulation. ROS: Patient currently denies any vision changes, tinnitus, difficulty speaking, facial droop, sore throat, shortness of breath, abdominal pain, diarrhea, dysuria, or weakness/numbness/paresthesias in any extremity   Physical Exam  Triage Vital Signs: ED Triage Vitals  Encounter Vitals Group     BP 04/11/24 1158 (!) 195/92     Girls Systolic BP Percentile --      Girls Diastolic BP Percentile --      Boys Systolic BP Percentile --      Boys Diastolic BP Percentile --      Pulse Rate 04/11/24 1158 76     Resp 04/11/24 1158 16     Temp 04/11/24 1158 97.9 F (36.6 C)     Temp Source 04/11/24 1158 Oral     SpO2 04/11/24 1158 98 %     Weight 04/11/24 1157 176 lb 5.9 oz (80 kg)     Height --      Head Circumference --      Peak Flow --      Pain Score 04/11/24 1157 10     Pain Loc --      Pain Education --      Exclude from Growth Chart --    Most recent vital signs: Vitals:   04/11/24 1158  BP: (!) 195/92  Pulse: 76  Resp:  16  Temp: 97.9 F (36.6 C)  SpO2: 98%   General: Awake, oriented x4. CV:  Good peripheral perfusion. Resp:  Normal effort. Abd:  No distention. Other:  Elderly obese African-American female resting comfortably in no acute distress.  Tenderness to palpation over right lower and mid lumbar paraspinal musculature lateral to the area of the gluteus medius ED Results / Procedures / Treatments  Labs (all labs ordered are listed, but only abnormal results are displayed) Labs Reviewed - No data to display PROCEDURES: Critical Care performed: No Procedures MEDICATIONS ORDERED IN ED: Medications - No data to display IMPRESSION / MDM / ASSESSMENT AND PLAN / ED COURSE  I reviewed the triage vital signs and the nursing notes.                             The patient is on the cardiac monitor to evaluate for evidence of arrhythmia and/or significant heart rate changes. Patient's presentation is most consistent with acute presentation with potential threat to life or bodily function. Patient presents for low back pain. Given History and Exam the patient appears to be at low risk for Spinal Cord Compression Syndrome, Vertebral  Malignancy/Mets, acute Spinal Fracture, Vertebral Osteomyelitis, Epidural Abscess, Infected or Obstructing Kidney Stone.  Their presentation appears most likely to be secondary to non-emergent musculoskeletal etiology vs non-emergent disc herniation.  ED Workup: Defer imaging and labwork for outpatient follow up at this time. Rx: Flexeril , OTC NSAID/Tylenol  Disposition: Discharge. Strict return precautions discussed with patient with full understanding. Advised patient to follow up promptly with primary care provider   FINAL CLINICAL IMPRESSION(S) / ED DIAGNOSES   Final diagnoses:  Acute right-sided low back pain without sciatica   Rx / DC Orders   ED Discharge Orders          Ordered    cyclobenzaprine  (FLEXERIL ) 5 MG tablet  3 times daily PRN        04/11/24 1257            Note:  This document was prepared using Dragon voice recognition software and may include unintentional dictation errors.   Alverto Shedd K, MD 04/11/24 [phone]

## 2024-04-11 NOTE — Telephone Encounter (Signed)
 Patient scheduled for port removal on Thursday 11/13 @ 3p

## 2024-04-11 NOTE — Discharge Instructions (Addendum)
 Please use ibuprofen (Motrin) up to 800 mg every 8 hours, naproxen (Naprosyn) up to 500 mg every 12 hours, and/or acetaminophen (Tylenol) up to 4 g/day for any continued pain.  Please do not use this medication regimen for longer than 7 days

## 2024-04-13 ENCOUNTER — Ambulatory Visit (INDEPENDENT_AMBULATORY_CARE_PROVIDER_SITE_OTHER): Admitting: Internal Medicine

## 2024-04-13 DIAGNOSIS — J441 Chronic obstructive pulmonary disease with (acute) exacerbation: Secondary | ICD-10-CM | POA: Diagnosis not present

## 2024-04-13 NOTE — Progress Notes (Signed)
 Patient for IR Port Removal on Thurs 04/14/24, I called and spoke with the patient on the phone and gave pre-procedure instructions. Pt was made aware to be here at 2:30p and check in at the H&V entrance. Pt stated understanding. Called 04/12/24

## 2024-04-14 ENCOUNTER — Ambulatory Visit
Admission: RE | Admit: 2024-04-14 | Discharge: 2024-04-14 | Disposition: A | Discharge status: Home / Self Care | Source: Ambulatory Visit | Attending: Oncology | Admitting: Oncology

## 2024-04-14 DIAGNOSIS — Z452 Encounter for adjustment and management of vascular access device: Secondary | ICD-10-CM | POA: Diagnosis present

## 2024-04-14 DIAGNOSIS — Z853 Personal history of malignant neoplasm of breast: Secondary | ICD-10-CM | POA: Diagnosis not present

## 2024-04-14 DIAGNOSIS — Z95828 Presence of other vascular implants and grafts: Secondary | ICD-10-CM

## 2024-04-14 DIAGNOSIS — Z9221 Personal history of antineoplastic chemotherapy: Secondary | ICD-10-CM | POA: Diagnosis not present

## 2024-04-14 HISTORY — PX: IR REMOVAL TUN ACCESS W/ PORT W/O FL MOD SED: IMG2290

## 2024-04-14 MED ORDER — LIDOCAINE HCL 1 % IJ SOLN
INTRAMUSCULAR | Status: AC
  Filled 2024-04-14: qty 20

## 2024-04-14 MED ORDER — LIDOCAINE HCL 1 % IJ SOLN
6.0000 mL | Freq: Once | INTRAMUSCULAR | Status: AC
  Administered 2024-04-14: 6 mL via INTRADERMAL

## 2024-04-19 ENCOUNTER — Ambulatory Visit: Admitting: Internal Medicine

## 2024-04-29 NOTE — Procedures (Signed)
 [facility] [address] [place], [address]    Complete Pulmonary Function Testing Interpretation:  FINDINGS:  The forced vital capacity is mildly decreased.  FEV1 was 1.26 L which is 71% of predicted and is mildly decreased.  FEV1 FVC ratio was mildly decreased.  Total lung capacity was normal.  Residual volume increased FRC was normal.  The DLCO was normal.  Postbronchodilator there was no significant change in the FEV1  IMPRESSION:  This pulmonary function study is consistent with mild obstructive lung disease clinical correlation is recommended  Elfreda DELENA Bathe, MD [facility] Pulmonary Critical Care Medicine Sleep Medicine

## 2024-05-16 ENCOUNTER — Telehealth: Payer: Self-pay | Admitting: Nurse Practitioner

## 2024-05-16 NOTE — Telephone Encounter (Signed)
 Received C-SNP verification from [facility]. Gave to Alyssa-Toni

## 2024-05-24 ENCOUNTER — Telehealth: Payer: Self-pay | Admitting: Nurse Practitioner

## 2024-05-24 NOTE — Telephone Encounter (Signed)
 C-SNP signed & faxed back to [facility]; [phone]. Scanned-Toni

## 2024-06-20 LAB — PULMONARY FUNCTION TEST

## 2024-08-16 ENCOUNTER — Other Ambulatory Visit

## 2024-08-16 ENCOUNTER — Ambulatory Visit: Admitting: Oncology

## 2024-10-12 ENCOUNTER — Ambulatory Visit: Admitting: Nurse Practitioner

## 2025-05-10 ENCOUNTER — Encounter: Admitting: Internal Medicine
